# Patient Record
Sex: Female | Born: 1975 | Hispanic: Yes | Smoking: Never smoker
Health system: Southern US, Community
[De-identification: ages and names within clinical notes are randomized; demographics above are authoritative.]

## PROBLEM LIST (undated history)

## (undated) ENCOUNTER — Inpatient Hospital Stay (HOSPITAL_COMMUNITY): Payer: Self-pay

## (undated) DIAGNOSIS — Z5189 Encounter for other specified aftercare: Secondary | ICD-10-CM

## (undated) DIAGNOSIS — H269 Unspecified cataract: Secondary | ICD-10-CM

## (undated) DIAGNOSIS — N920 Excessive and frequent menstruation with regular cycle: Secondary | ICD-10-CM

## (undated) DIAGNOSIS — F329 Major depressive disorder, single episode, unspecified: Secondary | ICD-10-CM

## (undated) DIAGNOSIS — M1990 Unspecified osteoarthritis, unspecified site: Secondary | ICD-10-CM

## (undated) DIAGNOSIS — K219 Gastro-esophageal reflux disease without esophagitis: Secondary | ICD-10-CM

## (undated) DIAGNOSIS — G5603 Carpal tunnel syndrome, bilateral upper limbs: Secondary | ICD-10-CM

## (undated) DIAGNOSIS — F419 Anxiety disorder, unspecified: Secondary | ICD-10-CM

## (undated) DIAGNOSIS — O24419 Gestational diabetes mellitus in pregnancy, unspecified control: Secondary | ICD-10-CM

## (undated) DIAGNOSIS — I10 Essential (primary) hypertension: Secondary | ICD-10-CM

## (undated) DIAGNOSIS — E119 Type 2 diabetes mellitus without complications: Secondary | ICD-10-CM

## (undated) DIAGNOSIS — Z8632 Personal history of gestational diabetes: Secondary | ICD-10-CM

## (undated) DIAGNOSIS — A048 Other specified bacterial intestinal infections: Secondary | ICD-10-CM

## (undated) DIAGNOSIS — Z973 Presence of spectacles and contact lenses: Secondary | ICD-10-CM

## (undated) DIAGNOSIS — E039 Hypothyroidism, unspecified: Secondary | ICD-10-CM

## (undated) DIAGNOSIS — E282 Polycystic ovarian syndrome: Secondary | ICD-10-CM

## (undated) DIAGNOSIS — G4730 Sleep apnea, unspecified: Secondary | ICD-10-CM

## (undated) DIAGNOSIS — E133299 Other specified diabetes mellitus with mild nonproliferative diabetic retinopathy without macular edema, unspecified eye: Secondary | ICD-10-CM

## (undated) DIAGNOSIS — D649 Anemia, unspecified: Secondary | ICD-10-CM

## (undated) DIAGNOSIS — N926 Irregular menstruation, unspecified: Secondary | ICD-10-CM

## (undated) DIAGNOSIS — D500 Iron deficiency anemia secondary to blood loss (chronic): Secondary | ICD-10-CM

## (undated) DIAGNOSIS — F32A Depression, unspecified: Secondary | ICD-10-CM

## (undated) DIAGNOSIS — N3941 Urge incontinence: Secondary | ICD-10-CM

## (undated) HISTORY — PX: OTHER SURGICAL HISTORY: SHX169

## (undated) HISTORY — DX: Depression, unspecified: F32.A

## (undated) HISTORY — DX: Encounter for other specified aftercare: Z51.89

## (undated) HISTORY — DX: Anxiety disorder, unspecified: F41.9

## (undated) HISTORY — DX: Sleep apnea, unspecified: G47.30

## (undated) HISTORY — DX: Unspecified cataract: H26.9

## (undated) HISTORY — DX: Major depressive disorder, single episode, unspecified: F32.9

---

## 2005-02-04 ENCOUNTER — Emergency Department (HOSPITAL_COMMUNITY): Admission: EM | Admit: 2005-02-04 | Discharge: 2005-02-04 | Payer: Self-pay | Admitting: Emergency Medicine

## 2005-02-12 ENCOUNTER — Emergency Department (HOSPITAL_COMMUNITY): Admission: EM | Admit: 2005-02-12 | Discharge: 2005-02-12 | Payer: Self-pay | Admitting: Emergency Medicine

## 2005-06-14 ENCOUNTER — Emergency Department (HOSPITAL_COMMUNITY): Admission: EM | Admit: 2005-06-14 | Discharge: 2005-06-14 | Payer: Self-pay | Admitting: Emergency Medicine

## 2006-05-13 ENCOUNTER — Emergency Department (HOSPITAL_COMMUNITY): Admission: EM | Admit: 2006-05-13 | Discharge: 2006-05-13 | Payer: Self-pay | Admitting: Emergency Medicine

## 2007-09-12 ENCOUNTER — Emergency Department (HOSPITAL_COMMUNITY): Admission: EM | Admit: 2007-09-12 | Discharge: 2007-09-12 | Payer: Self-pay | Admitting: Emergency Medicine

## 2009-03-14 ENCOUNTER — Inpatient Hospital Stay (HOSPITAL_COMMUNITY): Admission: AD | Admit: 2009-03-14 | Discharge: 2009-03-14 | Payer: Self-pay | Admitting: Obstetrics & Gynecology

## 2009-03-15 ENCOUNTER — Inpatient Hospital Stay (HOSPITAL_COMMUNITY): Admission: RE | Admit: 2009-03-15 | Discharge: 2009-03-15 | Payer: Self-pay | Admitting: Obstetrics & Gynecology

## 2009-04-21 ENCOUNTER — Ambulatory Visit: Payer: Self-pay | Admitting: Obstetrics and Gynecology

## 2009-04-21 ENCOUNTER — Encounter: Payer: Self-pay | Admitting: Obstetrics & Gynecology

## 2009-04-21 ENCOUNTER — Other Ambulatory Visit: Admission: RE | Admit: 2009-04-21 | Discharge: 2009-04-21 | Payer: Self-pay | Admitting: Obstetrics and Gynecology

## 2009-04-21 LAB — CONVERTED CEMR LAB
HCT: 42.2 % (ref 36.0–46.0)
Hemoglobin: 14.3 g/dL (ref 12.0–15.0)
MCHC: 33.9 g/dL (ref 30.0–36.0)
MCV: 84.9 fL (ref 78.0–100.0)
RBC: 4.97 M/uL (ref 3.87–5.11)
RDW: 12.7 % (ref 11.5–15.5)
TSH: 1.243 microintl units/mL (ref 0.350–4.500)

## 2009-04-22 ENCOUNTER — Encounter: Payer: Self-pay | Admitting: Obstetrics & Gynecology

## 2009-05-12 ENCOUNTER — Ambulatory Visit: Payer: Self-pay | Admitting: Obstetrics and Gynecology

## 2009-05-13 ENCOUNTER — Encounter: Payer: Self-pay | Admitting: Obstetrics and Gynecology

## 2009-05-13 LAB — CONVERTED CEMR LAB
AST: 37 units/L (ref 0–37)
Alkaline Phosphatase: 124 units/L — ABNORMAL HIGH (ref 39–117)
BUN: 13 mg/dL (ref 6–23)
CO2: 24 meq/L (ref 19–32)
Calcium: 9.3 mg/dL (ref 8.4–10.5)
Chloride: 100 meq/L (ref 96–112)
Creatinine, Ser: 0.78 mg/dL (ref 0.40–1.20)
Glucose, Bld: 285 mg/dL — ABNORMAL HIGH (ref 70–99)

## 2009-06-16 ENCOUNTER — Ambulatory Visit: Payer: Self-pay | Admitting: Obstetrics and Gynecology

## 2009-06-20 ENCOUNTER — Ambulatory Visit: Payer: Self-pay | Admitting: Family Medicine

## 2009-06-20 DIAGNOSIS — E119 Type 2 diabetes mellitus without complications: Secondary | ICD-10-CM | POA: Insufficient documentation

## 2009-06-20 DIAGNOSIS — E282 Polycystic ovarian syndrome: Secondary | ICD-10-CM | POA: Insufficient documentation

## 2009-06-20 DIAGNOSIS — G5600 Carpal tunnel syndrome, unspecified upper limb: Secondary | ICD-10-CM | POA: Insufficient documentation

## 2009-08-29 ENCOUNTER — Inpatient Hospital Stay (HOSPITAL_COMMUNITY): Admission: AD | Admit: 2009-08-29 | Discharge: 2009-08-29 | Payer: Self-pay | Admitting: Obstetrics and Gynecology

## 2010-12-08 ENCOUNTER — Encounter: Payer: Self-pay | Admitting: *Deleted

## 2011-01-01 ENCOUNTER — Emergency Department (HOSPITAL_COMMUNITY)
Admission: EM | Admit: 2011-01-01 | Discharge: 2011-01-02 | Disposition: A | Discharge status: Home / Self Care | Payer: Self-pay | Attending: Emergency Medicine | Admitting: Emergency Medicine

## 2011-01-01 DIAGNOSIS — H5789 Other specified disorders of eye and adnexa: Secondary | ICD-10-CM | POA: Insufficient documentation

## 2011-01-01 DIAGNOSIS — J029 Acute pharyngitis, unspecified: Secondary | ICD-10-CM | POA: Insufficient documentation

## 2011-01-01 DIAGNOSIS — J329 Chronic sinusitis, unspecified: Secondary | ICD-10-CM | POA: Insufficient documentation

## 2011-01-01 DIAGNOSIS — H11419 Vascular abnormalities of conjunctiva, unspecified eye: Secondary | ICD-10-CM | POA: Insufficient documentation

## 2011-01-01 DIAGNOSIS — R51 Headache: Secondary | ICD-10-CM | POA: Insufficient documentation

## 2011-01-01 DIAGNOSIS — J3489 Other specified disorders of nose and nasal sinuses: Secondary | ICD-10-CM | POA: Insufficient documentation

## 2011-01-01 DIAGNOSIS — H109 Unspecified conjunctivitis: Secondary | ICD-10-CM | POA: Insufficient documentation

## 2011-01-31 LAB — CBC
HCT: 41.9 % (ref 36.0–46.0)
Hemoglobin: 14.2 g/dL (ref 12.0–15.0)
MCV: 86.1 fL (ref 78.0–100.0)
Platelets: 376 10*3/uL (ref 150–400)
RBC: 4.87 MIL/uL (ref 3.87–5.11)
RDW: 12.5 % (ref 11.5–15.5)
WBC: 12.3 10*3/uL — ABNORMAL HIGH (ref 4.0–10.5)

## 2011-01-31 LAB — HCG, SERUM, QUALITATIVE: Preg, Serum: NEGATIVE

## 2011-02-05 LAB — POCT URINALYSIS DIP (DEVICE)
Bilirubin Urine: NEGATIVE
Glucose, UA: 500 mg/dL — AB
Hgb urine dipstick: NEGATIVE
Ketones, ur: NEGATIVE mg/dL
Nitrite: NEGATIVE
Protein, ur: NEGATIVE mg/dL
Specific Gravity, Urine: 1.01 (ref 1.005–1.030)
Urobilinogen, UA: 1 mg/dL (ref 0.0–1.0)
pH: 5.5 (ref 5.0–8.0)

## 2011-02-05 LAB — POCT PREGNANCY, URINE: Preg Test, Ur: NEGATIVE

## 2011-02-06 LAB — URINALYSIS, ROUTINE W REFLEX MICROSCOPIC
Glucose, UA: NEGATIVE mg/dL
Ketones, ur: NEGATIVE mg/dL
Nitrite: NEGATIVE
Specific Gravity, Urine: >1.03 — ABNORMAL HIGH (ref 1.005–1.030)
pH: 5.5 (ref 5.0–8.0)

## 2011-02-06 LAB — CBC
HCT: 40.1 % (ref 36.0–46.0)
MCHC: 34.8 g/dL (ref 30.0–36.0)
MCV: 86.5 fL (ref 78.0–100.0)
Platelets: 302 10*3/uL (ref 150–400)

## 2011-02-06 LAB — WET PREP, GENITAL: Yeast Wet Prep HPF POC: NONE SEEN

## 2011-02-06 LAB — GC/CHLAMYDIA PROBE AMP, GENITAL
Chlamydia, DNA Probe: NEGATIVE
GC Probe Amp, Genital: NEGATIVE

## 2011-02-06 LAB — URINE MICROSCOPIC-ADD ON
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
## 2011-03-13 NOTE — Group Therapy Note (Signed)
NAME:  SHARNEE, DOUGLASS NO.:  [id number]   MEDICAL RECORD NO.:  [id number]          PATIENT TYPE:  WOC   LOCATION:  [facility]                   FACILITY:  [facility]   PHYSICIAN:  Argentina Donovan, MD        DATE OF BIRTH:  04-12-1976   DATE OF SERVICE:  06/16/2009                                  CLINIC NOTE   The patient is a 35 year old English speaking Hispanic female, gravida  2, para 2-0-0-2, who was in prior visits for menometrorrhagia.  She has  polycystic ovarian syndrome and was placed on metformin 500.  She also  has a diabetic and has not had any real followup.  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  She has made a  deal for consultation with the nutritionist there and she is already  started to lose some weight.  On her last visit, which was in July, she  weighed 280 pounds, and today she is 275.  She is going to be followed  the [facility].  She has an appointment there for control  her diabetes.  She has been on metformin 500.  We are going to raise  that to 1000.  I have wondered if she starts ovulating regularly, she is  got to be careful of getting pregnant, she states she lives with her  husband, but they do not have sex anymore, and I told her once she is  under control with her diabetes, and if her periods are still very very  irregular after 2 months with control of her diabetes and being on the  metformin 1000 b.i.d., to come up back in, so we can reevaluate her.  An  endometrial biopsy was done, was negative.   IMPRESSION:  Polycystic ovarian syndrome, metabolic syndrome with  diabetes mellitus.           ______________________________  Argentina Donovan, MD     PR/MEDQ  D:  06/16/2009  T:  06/17/2009  Job:  [id number]

## 2011-07-19 ENCOUNTER — Emergency Department (HOSPITAL_COMMUNITY)
Admission: EM | Admit: 2011-07-19 | Discharge: 2011-07-19 | Disposition: A | Discharge status: Home / Self Care | Payer: Self-pay | Attending: Emergency Medicine | Admitting: Emergency Medicine

## 2011-07-19 DIAGNOSIS — R21 Rash and other nonspecific skin eruption: Secondary | ICD-10-CM | POA: Insufficient documentation

## 2011-07-19 DIAGNOSIS — L293 Anogenital pruritus, unspecified: Secondary | ICD-10-CM | POA: Insufficient documentation

## 2011-07-19 DIAGNOSIS — R300 Dysuria: Secondary | ICD-10-CM | POA: Insufficient documentation

## 2011-07-19 DIAGNOSIS — N898 Other specified noninflammatory disorders of vagina: Secondary | ICD-10-CM | POA: Insufficient documentation

## 2011-07-19 DIAGNOSIS — E119 Type 2 diabetes mellitus without complications: Secondary | ICD-10-CM | POA: Insufficient documentation

## 2011-07-19 LAB — URINE MICROSCOPIC-ADD ON

## 2011-07-19 LAB — URINALYSIS, ROUTINE W REFLEX MICROSCOPIC
Leukocytes, UA: NEGATIVE
Nitrite: NEGATIVE
Urobilinogen, UA: 0.2 mg/dL (ref 0.0–1.0)

## 2011-07-19 LAB — WET PREP, GENITAL
Trich, Wet Prep: NONE SEEN
Yeast Wet Prep HPF POC: NONE SEEN

## 2011-09-20 ENCOUNTER — Encounter: Payer: Self-pay | Admitting: Emergency Medicine

## 2011-09-20 ENCOUNTER — Emergency Department (HOSPITAL_COMMUNITY)
Admission: EM | Admit: 2011-09-20 | Discharge: 2011-09-20 | Disposition: A | Discharge status: Home / Self Care | Payer: Self-pay | Attending: Emergency Medicine | Admitting: Emergency Medicine

## 2011-09-20 DIAGNOSIS — L293 Anogenital pruritus, unspecified: Secondary | ICD-10-CM | POA: Insufficient documentation

## 2011-09-20 DIAGNOSIS — N899 Noninflammatory disorder of vagina, unspecified: Secondary | ICD-10-CM | POA: Insufficient documentation

## 2011-09-20 DIAGNOSIS — N949 Unspecified condition associated with female genital organs and menstrual cycle: Secondary | ICD-10-CM | POA: Insufficient documentation

## 2011-09-20 DIAGNOSIS — B3731 Acute candidiasis of vulva and vagina: Secondary | ICD-10-CM | POA: Insufficient documentation

## 2011-09-20 DIAGNOSIS — E119 Type 2 diabetes mellitus without complications: Secondary | ICD-10-CM | POA: Insufficient documentation

## 2011-09-20 DIAGNOSIS — E669 Obesity, unspecified: Secondary | ICD-10-CM | POA: Insufficient documentation

## 2011-09-20 DIAGNOSIS — B373 Candidiasis of vulva and vagina: Secondary | ICD-10-CM

## 2011-09-20 DIAGNOSIS — Z79899 Other long term (current) drug therapy: Secondary | ICD-10-CM | POA: Insufficient documentation

## 2011-09-20 DIAGNOSIS — I10 Essential (primary) hypertension: Secondary | ICD-10-CM | POA: Insufficient documentation

## 2011-09-20 DIAGNOSIS — R21 Rash and other nonspecific skin eruption: Secondary | ICD-10-CM | POA: Insufficient documentation

## 2011-09-20 HISTORY — DX: Essential (primary) hypertension: I10

## 2011-09-20 LAB — URINALYSIS, ROUTINE W REFLEX MICROSCOPIC
Bilirubin Urine: NEGATIVE
Ketones, ur: NEGATIVE mg/dL
Nitrite: NEGATIVE
Protein, ur: NEGATIVE mg/dL
Urobilinogen, UA: 0.2 mg/dL (ref 0.0–1.0)

## 2011-09-20 LAB — WET PREP, GENITAL
Clue Cells Wet Prep HPF POC: NONE SEEN
Trich, Wet Prep: NONE SEEN
Yeast Wet Prep HPF POC: NONE SEEN

## 2011-09-20 MED ORDER — FLUCONAZOLE 150 MG PO TABS
150.0000 mg | ORAL_TABLET | ORAL | Status: AC
  Administered 2011-09-20: 150 mg via ORAL
  Filled 2011-09-20: qty 1

## 2011-09-20 MED ORDER — CLOTRIMAZOLE 1 % EX CREA: TOPICAL_CREAM | CUTANEOUS | Status: DC

## 2011-09-20 MED ORDER — AZITHROMYCIN 250 MG PO TABS
1000.0000 mg | ORAL_TABLET | Freq: Once | ORAL | Status: AC
  Administered 2011-09-20: 1000 mg via ORAL
  Filled 2011-09-20: qty 4

## 2011-09-20 MED ORDER — DIPHENHYDRAMINE HCL 25 MG PO CAPS: 25.0000 mg | ORAL_CAPSULE | Freq: Four times a day (QID) | ORAL | Status: DC | PRN

## 2011-09-20 MED ORDER — CEFTRIAXONE SODIUM 250 MG IJ SOLR
250.0000 mg | Freq: Once | INTRAMUSCULAR | Status: AC
  Administered 2011-09-20: 250 mg via INTRAMUSCULAR
  Filled 2011-09-20: qty 250

## 2011-09-20 MED ORDER — FLUCONAZOLE 200 MG PO TABS: 200.0000 mg | ORAL_TABLET | Freq: Every day | ORAL | Status: DC

## 2011-09-20 MED ORDER — LIDOCAINE HCL (PF) 1 % IJ SOLN
INTRAMUSCULAR | Status: AC
  Administered 2011-09-20: 0.9 mL
  Filled 2011-09-20: qty 5

## 2011-09-20 MED ORDER — NYSTATIN-TRIAMCINOLONE 100000-0.1 UNIT/GM-% EX CREA: TOPICAL_CREAM | CUTANEOUS | Status: DC

## 2011-09-20 NOTE — ED Notes (Signed)
Patient is resting comfortably. 

## 2011-09-20 NOTE — ED Notes (Signed)
Pt states pain has been going on for about two months.  Pt states she has been to the doctor and has been treated for "infection" but not getting any better.

## 2011-09-20 NOTE — ED Notes (Signed)
Pharmacy called as still waiting for medication

## 2011-09-20 NOTE — ED Provider Notes (Signed)
History     CSN: [id number] Arrival date & time: 09/20/2011  8:50 AM   First MD Initiated Contact with Patient 09/20/11 [phone]      Chief Complaint  Patient presents with  . Pelvic Pain    (Consider location/radiation/quality/duration/timing/severity/associated sxs/prior treatment) Patient is a 35 y.o. female presenting with vaginal itching. The history is provided by the patient.  Vaginal Itching Associated symptoms include a rash. Pertinent negatives include no abdominal pain, nausea or vomiting.   Patient presents today complaining of vaginal itching and burning. This is a recurrent problem. She states she was originally seen for this in the ED about 2 months ago and was reevaluated by her GYN. She was apparently put on Diflucan which she took 3 times every week. This seemed to help somewhat, but symptoms have been recurring over the past week or 2. She states that she has a lot of vaginal irritation, and is very painful to urinate due to the irritation of the exterior skin. She's not noticed any unusual bleeding or discharge. She's not had any fevers at home. She does have diabetes, and was recently diagnosed. She states that her blood sugars have been running in the high 100s over the past several weeks. In addition to the Diflucan, she's been using nystatin cream as well as some over-the-counter cream for her vaginal irritation and another over-the-counter antibacterial cream. She states that the pain has been especially bad over the past several days. She tried to call her doctor's office which was closed due to the Thanksgiving holiday, so she came here.  Past Medical History  Diagnosis Date  . Hypertension   . Diabetes mellitus     No past surgical history on file.  No family history on file.  History  Substance Use Topics  . Smoking status: Not on file  . Smokeless tobacco: Not on file  . Alcohol Use: Yes     socially    OB History    Grav Para Term Preterm Abortions  TAB SAB Ect Mult Living                  Review of Systems  Constitutional: Negative.   Gastrointestinal: Negative for nausea, vomiting and abdominal pain.  Genitourinary: Positive for dysuria and vaginal pain. Negative for frequency, hematuria, flank pain, difficulty urinating and pelvic pain.  Skin: Positive for color change and rash.    Allergies  Review of patient's allergies indicates no known allergies.  Home Medications   Current Outpatient Rx  Name Route Sig Dispense Refill  . METFORMIN HCL 1000 MG PO TABS  NO INSTRUCTIONS GIVEN       BP 149/92  Pulse 90  Temp(Src) 97.6 F (36.4 C) (Oral)  Resp 18  SpO2 98%  Physical Exam  Nursing note and vitals reviewed. Constitutional: She is oriented to person, place, and time. She appears well-developed and well-nourished. No distress.  HENT:  Head: Normocephalic and atraumatic.  Right Ear: External ear normal.  Left Ear: External ear normal.  Mouth/Throat: Oropharynx is clear and moist. No oropharyngeal exudate.  Eyes: Conjunctivae are normal. Pupils are equal, round, and reactive to light.  Neck: Normal range of motion.  Abdominal: Soft. Bowel sounds are normal.       Obese abd  Genitourinary:       Chaperone present during exam. External vulvar irritation noted; appears c/w vulvovaginitis. Satellite lesions also present. Cervix does not appear irritated, and there is not a copious amount of vaginal discharge.  Musculoskeletal: Normal range of motion.  Neurological: She is alert and oriented to person, place, and time.  Skin: Skin is warm and dry. No rash noted. She is not diaphoretic.    ED Course  Procedures (including critical care time)  Labs Reviewed  WET PREP, GENITAL - Abnormal; Notable for the following:    WBC, Wet Prep HPF POC MANY (*)    All other components within normal limits  URINALYSIS, ROUTINE W REFLEX MICROSCOPIC - Abnormal; Notable for the following:    Appearance CLOUDY (*)    Glucose, UA  >1000 (*)    Hgb urine dipstick SMALL (*)    Leukocytes, UA SMALL (*)    All other components within normal limits  URINE MICROSCOPIC-ADD ON - Abnormal; Notable for the following:    Squamous Epithelial / LPF FEW (*)    All other components within normal limits  GC/CHLAMYDIA PROBE AMP, GENITAL  URINE CULTURE   No results found.   1. Candidal vulvovaginitis       MDM  Many WBCs seen on wet prep. Pt treated pre-emptively for GC/chlam. Clinically, this appears c/w mod-severe candida vulvovaginitis. She was given 1 Diflucan in the dept and given rxes for Lotrimin, nystatin/triamcinolone, PO Benadryl for itching, and additional Diflucan to take in 72hrs. She was instructed to f/u with GYN for a recheck and further mgmt.        Grant Fontana, [place] 09/20/11 1950

## 2011-09-20 NOTE — ED Notes (Signed)
Pelvic cart at bedside, patient undressed from waist down.

## 2011-09-21 LAB — GC/CHLAMYDIA PROBE AMP, GENITAL
Chlamydia, DNA Probe: NEGATIVE
GC Probe Amp, Genital: NEGATIVE

## 2011-09-21 NOTE — ED Provider Notes (Signed)
Medical screening examination/treatment/procedure(s) were performed by non-physician practitioner and as supervising physician I was immediately available for consultation/collaboration.  Flint Melter, MD 09/21/11 2350

## 2012-02-14 ENCOUNTER — Emergency Department (HOSPITAL_COMMUNITY)
Admission: EM | Admit: 2012-02-14 | Discharge: 2012-02-14 | Disposition: A | Discharge status: Home / Self Care | Payer: Self-pay | Attending: Emergency Medicine | Admitting: Emergency Medicine

## 2012-02-14 ENCOUNTER — Encounter (HOSPITAL_COMMUNITY): Payer: Self-pay

## 2012-02-14 ENCOUNTER — Emergency Department (HOSPITAL_COMMUNITY): Payer: Self-pay

## 2012-02-14 DIAGNOSIS — R51 Headache: Secondary | ICD-10-CM

## 2012-02-14 DIAGNOSIS — H538 Other visual disturbances: Secondary | ICD-10-CM | POA: Insufficient documentation

## 2012-02-14 MED ORDER — KETOROLAC TROMETHAMINE 15 MG/ML IJ SOLN
15.0000 mg | Freq: Once | INTRAMUSCULAR | Status: AC
  Administered 2012-02-14: 15 mg via INTRAVENOUS
  Filled 2012-02-14: qty 1

## 2012-02-14 MED ORDER — METOCLOPRAMIDE HCL 5 MG/ML IJ SOLN
10.0000 mg | Freq: Once | INTRAMUSCULAR | Status: AC
  Administered 2012-02-14: 10 mg via INTRAVENOUS
  Filled 2012-02-14: qty 2

## 2012-02-14 MED ORDER — SODIUM CHLORIDE 0.9 % IV BOLUS (SEPSIS)
1000.0000 mL | Freq: Once | INTRAVENOUS | Status: AC
  Administered 2012-02-14: 1000 mL via INTRAVENOUS

## 2012-02-14 MED ORDER — DIPHENHYDRAMINE HCL 50 MG/ML IJ SOLN
25.0000 mg | Freq: Once | INTRAMUSCULAR | Status: AC
  Administered 2012-02-14: 25 mg via INTRAVENOUS
  Filled 2012-02-14: qty 1

## 2012-02-14 MED ORDER — KETOROLAC TROMETHAMINE 30 MG/ML IJ SOLN
INTRAMUSCULAR | Status: AC
  Filled 2012-02-14: qty 1

## 2012-02-14 MED ORDER — MORPHINE SULFATE 4 MG/ML IJ SOLN
6.0000 mg | Freq: Once | INTRAMUSCULAR | Status: AC
  Administered 2012-02-14: 6 mg via INTRAVENOUS
  Filled 2012-02-14: qty 2

## 2012-02-14 NOTE — ED Notes (Signed)
Patient is resting comfortably. 

## 2012-02-14 NOTE — ED Notes (Signed)
NAD noted at time of d/c home. D/C inst given by MD and pt and family verbalized an understanding.

## 2012-02-14 NOTE — ED Provider Notes (Signed)
History    36 year old female with headache. This was gradual onset. Triage note was reviewed but do not agree that headache onset was sudden. Onset was while patient was at work. There is no history of trauma. Since onset headache is been relatively persistent. Left temporal region. No radiation. Throbbing in nature. No significant past headache history. Initially felt like he had some blurred vision which has since resolved. No neck pain or neck stiffness. No numbness, tingling or loss of strength. Patient denies use of blood thinning medication. She does not think she has a family history of aneurysm. Hx through family translating at times.  CSN: [id number]  Arrival date & time 02/14/12  1004   First MD Initiated Contact with Patient 02/14/12 1012      Chief Complaint  Patient presents with  . Headache    (Consider location/radiation/quality/duration/timing/severity/associated sxs/prior treatment) HPI  Past Medical History  Diagnosis Date  . Hypertension   . Diabetes mellitus     Past Surgical History  Procedure Date  . Cesarean section     No family history on file.  History  Substance Use Topics  . Smoking status: Not on file  . Smokeless tobacco: Not on file  . Alcohol Use: No     socially    OB History    Grav Para Term Preterm Abortions TAB SAB Ect Mult Living                  Review of Systems   Review of symptoms negative unless otherwise noted in HPI.   Allergies  Review of patient's allergies indicates no known allergies.  Home Medications  No current outpatient prescriptions on file.  BP 114/56  Pulse 77  Temp(Src) 98.4 F (36.9 C) (Oral)  SpO2 96%  LMP 01/31/2012  Physical Exam  Nursing note and vitals reviewed. Constitutional: She is oriented to person, place, and time.       Sitting in bed holding L side of head. Eyes closed. Obese.  HENT:  Head: Normocephalic and atraumatic.  Eyes: Conjunctivae and EOM are normal. Pupils are equal,  round, and reactive to light. Right eye exhibits no discharge. Left eye exhibits no discharge.       Corneas clear. Pupils 4mm and reactive b/l. Anterior chambers clear. No proptosis. Lids, lashes and periorbital tissues unremarkable.  Neck: Normal range of motion. Neck supple.  Cardiovascular: Normal rate, regular rhythm and normal heart sounds.  Exam reveals no gallop and no friction rub.   No murmur heard. Pulmonary/Chest: Effort normal and breath sounds normal. No respiratory distress.  Abdominal: Soft. She exhibits no distension. There is no tenderness.  Musculoskeletal: Normal range of motion. She exhibits no edema and no tenderness.  Lymphadenopathy:    She has no cervical adenopathy.  Neurological: She is alert and oriented to person, place, and time. No cranial nerve deficit. She exhibits normal muscle tone. Coordination normal.       Patient is alert. Cranial nerves are intact. Speech is clear and content appropriate. Strength is 5 out of 5 bilateral upper lower extremities. There is no pronator drift. Good finger to nose and heel to shin testing bilaterally. Is steady. Sensation is grossly intact to light touch. Visual fields intact to confrontation.  Skin: Skin is warm and dry. No rash noted.  Psychiatric: She has a normal mood and affect. Her behavior is normal. Thought content normal.    ED Course  Procedures (including critical care time)  Labs Reviewed - No data  to display Ct Head Wo Contrast  02/14/2012  *RADIOLOGY REPORT*  Clinical Data: Headache and blurred vision  CT HEAD WITHOUT CONTRAST  Technique:  Contiguous axial images were obtained from the base of the skull through the vertex without contrast.  Comparison: None.  Findings: The ventricles are normal in size, shape, and position. There is no mass effect or midline shift.  No acute hemorrhage or abnormal extra-axial fluid collections are identified.  The gray/white differentiation is normal.  The orbits, calvarium,  visualized paranasal sinuses have a normal appearance.  IMPRESSION: Normal CT scan of the head with no evidence of acute intracranial abnormality.  Original Report Authenticated By: Brandon Melnick, M.D.     1. Headache       MDM  36 year old female with headache. Concerning feature of worse headache of worst headache of life and no significant headache history. Headache was gradual onset though. No hx of trauma. Afebrile and no signs of meningismus.  CT was done to evaluate for possible subarachnoid hemorrhage. CT was done within 6 hours of symptom onset. Pt has a nonfocal neurological examination. Her initial visual complaints have completely resolved. Doubt ocular etiology. Eye exam normal. Consider glaucoma but doubt. Very unlikely temporal arteritis given age. Not like CO poisoning given warming weather and no contacts with similar. Doubt carotid/vertebral artery dissection. Doubt venous thrombosis. Headache is almost completely resolved after pain medicines. Patient feels comfortable going home. CT of the head was negative for acute pathology. Return precautions were discussed with patient and family. Outpatient followup as needed.        Raeford Razor, MD 02/14/12 1213

## 2012-02-14 NOTE — ED Notes (Signed)
Family at bedside. 

## 2012-02-14 NOTE — ED Notes (Signed)
Pt. States she was working when she had a sudden onset of HA with blurred vision. Currently vision normal for patient.

## 2012-02-14 NOTE — Discharge Instructions (Signed)
Headaches, Frequently Asked Questions MIGRAINE HEADACHES Q: What is migraine? What causes it? How can I treat it? A: Generally, migraine headaches begin as a dull ache. Then they develop into a constant, throbbing, and pulsating pain. You may experience pain at the temples. You may experience pain at the front or back of one or both sides of the head. The pain is usually accompanied by a combination of:  Nausea.   Vomiting.   Sensitivity to light and noise.  Some people (about 15%) experience an aura (see below) before an attack. The cause of migraine is believed to be chemical reactions in the brain. Treatment for migraine may include over-the-counter or prescription medications. It may also include self-help techniques. These include relaxation training and biofeedback.  Q: What is an aura? A: About 15% of people with migraine get an "aura". This is a sign of neurological symptoms that occur before a migraine headache. You may see wavy or jagged lines, dots, or flashing lights. You might experience tunnel vision or blind spots in one or both eyes. The aura can include visual or auditory hallucinations (something imagined). It may include disruptions in smell (such as strange odors), taste or touch. Other symptoms include:  Numbness.   A "pins and needles" sensation.   Difficulty in recalling or speaking the correct word.  These neurological events may last as long as 60 minutes. These symptoms will fade as the headache begins. Q: What is a trigger? A: Certain physical or environmental factors can lead to or "trigger" a migraine. These include:  Foods.   Hormonal changes.   Weather.   Stress.  It is important to remember that triggers are different for everyone. To help prevent migraine attacks, you need to figure out which triggers affect you. Keep a headache diary. This is a good way to track triggers. The diary will help you talk to your healthcare professional about your  condition. Q: Does weather affect migraines? A: Bright sunshine, hot, humid conditions, and drastic changes in barometric pressure may lead to, or "trigger," a migraine attack in some people. But studies have shown that weather does not act as a trigger for everyone with migraines. Q: What is the link between migraine and hormones? A: Hormones start and regulate many of your body's functions. Hormones keep your body in balance within a constantly changing environment. The levels of hormones in your body are unbalanced at times. Examples are during menstruation, pregnancy, or menopause. That can lead to a migraine attack. In fact, about three quarters of all women with migraine report that their attacks are related to the menstrual cycle.  Q: Is there an increased risk of stroke for migraine sufferers? A: The likelihood of a migraine attack causing a stroke is very remote. That is not to say that migraine sufferers cannot have a stroke associated with their migraines. In persons under age 40, the most common associated factor for stroke is migraine headache. But over the course of a person's normal life span, the occurrence of migraine headache may actually be associated with a reduced risk of dying from cerebrovascular disease due to stroke.  Q: What are acute medications for migraine? A: Acute medications are used to treat the pain of the headache after it has started. Examples over-the-counter medications, NSAIDs, ergots, and triptans.  Q: What are the triptans? A: Triptans are the newest class of abortive medications. They are specifically targeted to treat migraine. Triptans are vasoconstrictors. They moderate some chemical reactions in the brain.   The triptans work on receptors in your brain. Triptans help to restore the balance of a neurotransmitter called serotonin. Fluctuations in levels of serotonin are thought to be a main cause of migraine.  Q: Are over-the-counter medications for migraine  effective? A: Over-the-counter, or "OTC," medications may be effective in relieving mild to moderate pain and associated symptoms of migraine. But you should see your caregiver before beginning any treatment regimen for migraine.  Q: What are preventive medications for migraine? A: Preventive medications for migraine are sometimes referred to as "prophylactic" treatments. They are used to reduce the frequency, severity, and length of migraine attacks. Examples of preventive medications include antiepileptic medications, antidepressants, beta-blockers, calcium channel blockers, and NSAIDs (nonsteroidal anti-inflammatory drugs). Q: Why are anticonvulsants used to treat migraine? A: During the past few years, there has been an increased interest in antiepileptic drugs for the prevention of migraine. They are sometimes referred to as "anticonvulsants". Both epilepsy and migraine may be caused by similar reactions in the brain.  Q: Why are antidepressants used to treat migraine? A: Antidepressants are typically used to treat people with depression. They may reduce migraine frequency by regulating chemical levels, such as serotonin, in the brain.  Q: What alternative therapies are used to treat migraine? A: The term "alternative therapies" is often used to describe treatments considered outside the scope of conventional Western medicine. Examples of alternative therapy include acupuncture, acupressure, and yoga. Another common alternative treatment is herbal therapy. Some herbs are believed to relieve headache pain. Always discuss alternative therapies with your caregiver before proceeding. Some herbal products contain arsenic and other toxins. TENSION HEADACHES Q: What is a tension-type headache? What causes it? How can I treat it? A: Tension-type headaches occur randomly. They are often the result of temporary stress, anxiety, fatigue, or anger. Symptoms include soreness in your temples, a tightening  band-like sensation around your head (a "vice-like" ache). Symptoms can also include a pulling feeling, pressure sensations, and contracting head and neck muscles. The headache begins in your forehead, temples, or the back of your head and neck. Treatment for tension-type headache may include over-the-counter or prescription medications. Treatment may also include self-help techniques such as relaxation training and biofeedback. CLUSTER HEADACHES Q: What is a cluster headache? What causes it? How can I treat it? A: Cluster headache gets its name because the attacks come in groups. The pain arrives with little, if any, warning. It is usually on one side of the head. A tearing or bloodshot eye and a runny nose on the same side of the headache may also accompany the pain. Cluster headaches are believed to be caused by chemical reactions in the brain. They have been described as the most severe and intense of any headache type. Treatment for cluster headache includes prescription medication and oxygen. SINUS HEADACHES Q: What is a sinus headache? What causes it? How can I treat it? A: When a cavity in the bones of the face and skull (a sinus) becomes inflamed, the inflammation will cause localized pain. This condition is usually the result of an allergic reaction, a tumor, or an infection. If your headache is caused by a sinus blockage, such as an infection, you will probably have a fever. An x-ray will confirm a sinus blockage. Your caregiver's treatment might include antibiotics for the infection, as well as antihistamines or decongestants.  REBOUND HEADACHES Q: What is a rebound headache? What causes it? How can I treat it? A: A pattern of taking acute headache medications too   often can lead to a condition known as "rebound headache." A pattern of taking too much headache medication includes taking it more than 2 days per week or in excessive amounts. That means more than the label or a caregiver advises.  With rebound headaches, your medications not only stop relieving pain, they actually begin to cause headaches. Doctors treat rebound headache by tapering the medication that is being overused. Sometimes your caregiver will gradually substitute a different type of treatment or medication. Stopping may be a challenge. Regularly overusing a medication increases the potential for serious side effects. Consult a caregiver if you regularly use headache medications more than 2 days per week or more than the label advises. ADDITIONAL QUESTIONS AND ANSWERS Q: What is biofeedback? A: Biofeedback is a self-help treatment. Biofeedback uses special equipment to monitor your body's involuntary physical responses. Biofeedback monitors:  Breathing.   Pulse.   Heart rate.   Temperature.   Muscle tension.   Brain activity.  Biofeedback helps you refine and perfect your relaxation exercises. You learn to control the physical responses that are related to stress. Once the technique has been mastered, you do not need the equipment any more. Q: Are headaches hereditary? A: Four out of five (80%) of people that suffer report a family history of migraine. Scientists are not sure if this is genetic or a family predisposition. Despite the uncertainty, a child has a 50% chance of having migraine if one parent suffers. The child has a 75% chance if both parents suffer.  Q: Can children get headaches? A: By the time they reach high school, most young people have experienced some type of headache. Many safe and effective approaches or medications can prevent a headache from occurring or stop it after it has begun.  Q: What type of doctor should I see to diagnose and treat my headache? A: Start with your primary caregiver. Discuss his or her experience and approach to headaches. Discuss methods of classification, diagnosis, and treatment. Your caregiver may decide to recommend you to a headache specialist, depending upon  your symptoms or other physical conditions. Having diabetes, allergies, etc., may require a more comprehensive and inclusive approach to your headache. The National Headache Foundation will provide, upon request, a list of [facility] physician members in your state. Document Released: 01/05/2004 Document Revised: 10/04/2011 Document Reviewed: 06/14/2008 [facility] Patient Information 2012 [place], [place].  RESOURCE GUIDE  Dental Problems  Patients with Medicaid: [facility] [phone]                                           [phone] [address] [employer] Phone:  [phone]                                                  Phone:  [phone]  If unable to pay or uninsured, contact:  [facility] or [facility]. to become qualified for the adult dental clinic.  Chronic Pain Problems Contact [facility]  [phone] Patients need to be referred by their primary care doctor.  Insufficient Money for Medicine Contact  [address]:  call "211" or [employer] 301-111-8064.  No Primary Care Doctor Call [facility]  ([phone] Other agencies that provide inexpensive medical care    [facility]  [phone]    [facility]  [phone]    [facility]  [phone]    [facility]  [phone]    [facility]  ([phone]    [facility]  [phone]  Psychological Services [facility]  [phone] [facility] Services  [phone] [facility] Mental Health   [phone] (emergency services ([phone])  Substance Abuse Resources [facility]  4122751148 [facility] 669-346-8176 [facility] 2340245801 Floydene Flock ([phone] Residential & Outpatient Substance Abuse Program  (712)702-4653  Abuse/Neglect [facility] Child Abuse Hotline 438 207 8138 [facility] Child Abuse Hotline (930)307-5452 (After  Hours)  Emergency Shelter [facility] (757)572-3351  Maternity Homes [facility] [place] [facility] 3072480415 [facility] [phone]  MRSA Hotline #:   (571)587-1227    [facility] Resources  [facility] [place]     [employer]                          [facility] Dept. [address] [place]                       [address]      [address]  Blondell Reveal Phone:  [phone]                                   Phone:  [phone]                 Phone:  [phone]  [facility] Phone:  [phone]  [facility] Child Abuse Hotline [phone] (After Hours)

## 2013-11-09 ENCOUNTER — Encounter: Payer: Self-pay | Admitting: Obstetrics

## 2013-11-11 ENCOUNTER — Ambulatory Visit (INDEPENDENT_AMBULATORY_CARE_PROVIDER_SITE_OTHER): Payer: BC Managed Care – PPO | Admitting: Family Medicine

## 2013-11-11 ENCOUNTER — Ambulatory Visit: Payer: BC Managed Care – PPO

## 2013-11-11 VITALS — BP 158/96 | HR 103 | Temp 98.6°F | Resp 18 | Ht 62.0 in | Wt 262.0 lb

## 2013-11-11 DIAGNOSIS — E1165 Type 2 diabetes mellitus with hyperglycemia: Secondary | ICD-10-CM

## 2013-11-11 DIAGNOSIS — Z3009 Encounter for other general counseling and advice on contraception: Secondary | ICD-10-CM

## 2013-11-11 DIAGNOSIS — N926 Irregular menstruation, unspecified: Secondary | ICD-10-CM

## 2013-11-11 DIAGNOSIS — M79606 Pain in leg, unspecified: Secondary | ICD-10-CM

## 2013-11-11 DIAGNOSIS — M5430 Sciatica, unspecified side: Secondary | ICD-10-CM

## 2013-11-11 DIAGNOSIS — M545 Low back pain, unspecified: Secondary | ICD-10-CM

## 2013-11-11 DIAGNOSIS — M79609 Pain in unspecified limb: Secondary | ICD-10-CM

## 2013-11-11 DIAGNOSIS — IMO0001 Reserved for inherently not codable concepts without codable children: Secondary | ICD-10-CM

## 2013-11-11 DIAGNOSIS — IMO0002 Reserved for concepts with insufficient information to code with codable children: Secondary | ICD-10-CM

## 2013-11-11 LAB — CBC WITH DIFFERENTIAL/PLATELET
BASOS PCT: 0 % (ref 0–1)
Basophils Absolute: 0 10*3/uL (ref 0.0–0.1)
Eosinophils Absolute: 0.2 10*3/uL (ref 0.0–0.7)
Eosinophils Relative: 2 % (ref 0–5)
HEMATOCRIT: 44.8 % (ref 36.0–46.0)
HEMOGLOBIN: 15 g/dL (ref 12.0–15.0)
Lymphocytes Relative: 25 % (ref 12–46)
Lymphs Abs: 2.2 10*3/uL (ref 0.7–4.0)
MCH: 28.1 pg (ref 26.0–34.0)
MCHC: 33.5 g/dL (ref 30.0–36.0)
MCV: 84.1 fL (ref 78.0–100.0)
MONO ABS: 0.7 10*3/uL (ref 0.1–1.0)
MONOS PCT: 7 % (ref 3–12)
NEUTROS ABS: 6 10*3/uL (ref 1.7–7.7)
Neutrophils Relative %: 66 % (ref 43–77)
Platelets: 327 10*3/uL (ref 150–400)
RBC: 5.33 MIL/uL — ABNORMAL HIGH (ref 3.87–5.11)
RDW: 12.9 % (ref 11.5–15.5)
WBC: 9.1 10*3/uL (ref 4.0–10.5)

## 2013-11-11 LAB — POCT URINALYSIS DIPSTICK
Bilirubin, UA: NEGATIVE
Glucose, UA: >=1000
Leukocytes, UA: NEGATIVE
Nitrite, UA: NEGATIVE
PH UA: 5.5
SPEC GRAV UA: 1.02
Urobilinogen, UA: 1

## 2013-11-11 LAB — COMPREHENSIVE METABOLIC PANEL
ALBUMIN: 4 g/dL (ref 3.5–5.2)
ALK PHOS: 100 U/L (ref 39–117)
ALT: 29 U/L (ref 0–35)
AST: 25 U/L (ref 0–37)
BUN: 13 mg/dL (ref 6–23)
CALCIUM: 8.9 mg/dL (ref 8.4–10.5)
CHLORIDE: 99 meq/L (ref 96–112)
CO2: 26 meq/L (ref 19–32)
Creat: 0.79 mg/dL (ref 0.50–1.10)
GLUCOSE: 217 mg/dL — AB (ref 70–99)
POTASSIUM: 3.8 meq/L (ref 3.5–5.3)
Sodium: 136 mEq/L (ref 135–145)
Total Bilirubin: 0.3 mg/dL (ref 0.3–1.2)
Total Protein: 7.5 g/dL (ref 6.0–8.3)

## 2013-11-11 LAB — TSH: TSH: 0.507 u[IU]/mL (ref 0.350–4.500)

## 2013-11-11 LAB — POCT GLYCOSYLATED HEMOGLOBIN (HGB A1C): Hemoglobin A1C: 7.9

## 2013-11-11 LAB — POCT URINE PREGNANCY: PREG TEST UR: NEGATIVE

## 2013-11-11 LAB — GLUCOSE, POCT (MANUAL RESULT ENTRY): POC Glucose: 254 mg/dl — AB (ref 70–99)

## 2013-11-11 LAB — MICROALBUMIN, URINE: MICROALB UR: 19.25 mg/dL — AB (ref 0.00–1.89)

## 2013-11-11 MED ORDER — CYCLOBENZAPRINE HCL 5 MG PO TABS: 5.0000 mg | ORAL_TABLET | Freq: Every day | ORAL | Status: DC

## 2013-11-11 MED ORDER — NAPROXEN 500 MG PO TBEC: 500.0000 mg | DELAYED_RELEASE_TABLET | Freq: Two times a day (BID) | ORAL | Status: DC

## 2013-11-11 MED ORDER — METFORMIN HCL 500 MG PO TABS: 500.0000 mg | ORAL_TABLET | Freq: Two times a day (BID) | ORAL | Status: DC

## 2013-11-11 NOTE — Patient Instructions (Signed)
Ciática  con rehabilitación  (Sciatica with Rehab)  El nervio ciático va desde la región inferior de la espalda hacia la pierna y es el responsable de la sensibilidad y el control de los músculos de la parte de atrás (posterior) del muslo, pierna y pie. La ciática es una enfermedad caracterizada por una inflamación en este nervio.   SÍNTOMAS  · Signos de daño al nervio, incluso adormecimiento o debilidad en el lado posterior de las extremidades bajas.  · Dolor en la parte posterior del muslo que podría bajar hacia la pierna.  · Dolor que empeora al estar sentado durante largos períodos de tiempo.  · Algunas veces, sensibilidad en las nalgas.  CAUSAS  La causa de la ciática es la inflamación de los nervios ciáticos. La inflamación se debe a que algo irrita el nervio. Entre las causas de la irritación se encuentran:  · Estar sentado durante largos períodos.  · Traumatismos directos al nervio.  · Artritis de la columna.  · Hernia o ruptura de disco.  · Deslizamiento de una vértebra (espondilolistesis).  · Presión de los tejidos blandos, como músculos o el tejidos tipo ligamento (fascia).  LOS RIESGOS AUMENTAN CON:  · Deportes en los que se presiona la columna (fútbol americano o levantamiento de pesas).  · Poca fuerza y flexibilidad.  · No hacer un precalentamiento adecuado.  · Historia familiar de dolor de cintura o trastornos en discos.  · Lesiones o cirugías previas en la espalda.  · Mecánica incorrecta del cuerpo, en especial al levantar, o mala postura.  PREVENCIÓN  · Precalentamiento adecuado y elongación antes de la actividad.  · Mantener la forma física:  · Fuerza, flexibilidad y resistencia muscular.  · Capacidad cardiovascular.  · Conozca y use técnicas adecuadas, especialmente en posturas y levantamiento. Cuando sea posible, tener un entrenador que corrija la técnica incorrecta.  · Evite las actividades que tensionen constantemente la columna.  PRONÓSTICO  Si se trata adecuadamente, generalmente es curable  dentro de las 6 semanas. En ocasiones requiere someterse a una cirugía.   COMPLICACIONES RELACIONADAS  · Daño permanente que incluye dolor, adormecimiento, hormigueo o debilidad.  · Dolor crónico en la espalda.  · Riesgos de la cirugía: infecciones, hemorragias, daño en los nervios, o daños a los tejidos circundantes.  TRATAMIENTO  El tratamiento inicial incluye interrumpir las actividades que agravan los síntomas. Se incluye el uso de medicamentos y la aplicación de hielo para reducir el dolor y la inflamación. Los ejercicios de elongación y fortalecimiento pueden ayudar a reducir el dolor con la actividad. Los ejercicios pueden realizarse en el hogar o con un terapeuta. Un terapeuta podrá recomendarle otros tratamientos, como estimulación nerviosa electrónica transcutánea (TENS) o ultrasonido. En algunos casos se indica una inyección de corticoides para reducir la inflamación del nervio ciático. Si los síntomas persisten por más de 6 meses de tratamiento no quirúrgico (conservador), se indicará la cirugía.  MEDICAMENTOS   · Si necesita analgésicos, se recomiendan los antiinflamatorios no esteroides, como aspirina e ibuprofeno y otros calmantes menores, como acetaminofeno.  · No tome medicamentos para el dolor dentro de los 7 días previos a la cirugía.  · Los analgésicos prescriptos se indicarán si el médico lo considera necesario. Utilícelos como se le indique y sólo cuando lo necesite.  · Los ungüentos pueden ser beneficiosos.  · En algunos casos se indica una inyección de corticosteroides. Estas inyecciones deben reservarse para los casos graves, porque sólo se pueden administrar una determinada cantidad de veces.  CALOR Y   FRÍO   · El tratamiento con frío alivia el dolor y reduce la inflamación. El frío debe aplicarse durante 10 a 15 minutos cada 2 ó 3 horas para reducir la inflamación y el dolor e inmediatamente después de cualquier actividad que agrava los síntomas. Utilice bolsas de hielo o masajee la zona  con un trozo de hielo (masaje de hielo).  · El calor puede usarse antes de elongar y de las actividades de fortalecimiento indicadas por el profesional, le fisioterapeuta o el entrenador. Utilice una bolsa térmica o sumerja la lesión en agua caliente.  SOLICITE ATENCIÓN MÉDICA SI:  · El tratamiento parece no ofrecer beneficios, o el trastorno empeora.  · Los medicamentos producen efectos secundarios.  EJERCICIOS  EJERCICIOS DE AMPLITUD DE MOVIMIENTOS Y ELONGACIÓN Ciática  La mayoría de las personas con dolor de ciática encuentran que sus síntomas empeoran con el delantero excesiva flexión (flexión) o arco en la espalda baja (extensión). Los ejercicios que le ayudarán a resolver sus síntomas se centrarán en el movimiento contrario. El médico, fisioterapeuta o entrenador le ayudará a determinar qué ejercicios serán de ayuda para resolver su dolor de espalda. No realice ningún ejercicio sin consultarlo antes con el profesional. Discontinue los ejercicios que empeoran sus síntomas, hasta que hable con el médico. Si siente dolor, entumecimiento u hormigueo que irradia hacia los glúteos, piernas o pies, el objetivo de esta terapia es que estos síntomas se acerquen a la espalda y eventualmente desaparezcan. En ocasiones, los síntomas de la pierna mejorarán, pero el dolor en la espalda puede empeorar; esto es un indicio típico del progreso en la rehabilitación. Asegúrese de que estar atento a cualquier cambio en sus síntomas y las actividades que ha hecho en las 24 horas antes del cambio. Compartir esta información con su médico le permitirá un mejor tratamiento para tratar su enfermedad.  Estos ejercicios le ayudarán en la recuperación de la lesión. Los síntomas podrán aliviarse con o sin asistencia adicional de su médico, fisioterapeuta o entrenador. Al completar estos ejercicios, recuerde:   · Restaurar la flexibilidad del tejido ayuda a que las articulaciones recuperen el movimiento normal. Esto permite que el  movimiento y la actividad sea más saludables y menos dolorosos.  · Para que sea efectiva, cada elongación debe realizarse durante al menos 30 segundos.  · La elongación nunca debe ser dolorosa. Deberá sentir sólo un alargamiento o distensión suave del tejido que estira.  EJERCICIOS DE AMPLITUD DE MOVIMIENTOS Y ELONGACIÓN:  ELONGACION Flexión - una rodilla al pecho  · Recuéstese en una cama dura o sobre el piso, con ambas piernas extendidas al frente.  · Manteniendo una pierna en contacto con el piso, lleve la rodilla opuesta al pecho. Mantenga la pierna en esa posición, sosteniéndola por la zona posterior del muslo o por la rodilla.  · Presione hasta sentir un suave estiramiento en la cintura. Mantenga esta posición durante __________ segundos.  · Libere la pierna lentamente y repita el ejercicio con el lado opuesto.  Repítalo __________ veces. Realice este estiramiento __________ veces por día.   ELONGACIÓN Flexión, dos rodillas al pecho   · Recuéstese en una cama dura o sobre el piso, con ambas piernas extendidas al frente.  · Manteniendo una pierna en contacto con el piso, lleve la rodilla opuesta al pecho.  · Tense los músculos del estómago para apoyar la espalda y levante la otra rodilla hacia el pecho. Mantenga las piernas en su lugar y tómese por detrás de las caderas o las rodillas.  ·   Con ambas rodillas en el pecho, tire hasta que sienta un estiramiento en la parte trasera de la espalda. Mantenga esta posición durante __________ segundos.  · Tense los músculos del estómago y baje las piernas de a una por vez.  Repítalo __________ veces. Realice este estiramiento __________ veces por día.   ELONGACIÓN Rotación de la zona baja del tronco.  · Recuéstese sobre una cama firme o sobre el suelo. Mantenga las piernas al frente, doble las rodillas de modo que ambas apunten hacia el techo y los pies queden bien apoyados en el piso.  · Extienda los brazos a cada lado. Esto estabilizará la zona superior del cuerpo,  manteniendo los hombros en contacto con el piso.  · Con cuidado y lentamente deje caer ambas rodillas juntas hacia un lado, hasta que sienta un suave estiramiento en la espalda baja. Mantenga esta posición durante __________ segundos.  · Tense los músculos del estómago para apoyar la espalda y lleve las rodillas a la posición inicial. Repita el ejercicio hacia el otro lado.  Repítalo __________ veces. Realice este ejercicio __________ veces por día.  EJERCICIOS DE AMPLITUD DE MOVIMIENTOS Y FLEXIBILIDAD:  ELONGACIÓN Extensión posición prona sobre los codos  · Acuéstese sobre el estómago sobre el piso, una cama será muy blanda. Coloque las palmas a una distancia igual al ancho de los hombres y a la altura de la cabeza.  · Coloque los codos bajo los hombros. Si siente dolor, colóquese almohadas debajo del pecho.  · Deje que su cuerpo se relaje, de modo que las caderas queden más abajo y tengan más contacto con el piso.  · Mantenga esta posición durante __________ segundos.  · Vuelva lentamente a la posición plana sobre el piso.  Repítalo __________ veces. Realice este estiramiento __________ veces por día.   AMPLITUD DE MOVIMIENTOS - Extensión - flexión de brazos en posición prona  · Acuéstese sobre el estómago sobre el piso, una cama será muy blanda. Coloque las palmas a una distancia igual al ancho de los hombres y a la altura de la cabeza.  · Mantenga la espalda tan relajada como pueda, enderece lentamente los codos mientras mantiene las caderas contra el suelo. Puede modificar la posición de las manos para estar más cómodo. A medida que gana movimiento, sus manos quedarán más por debajo de los hombros.  · Mantenga cada posición durante __________ segundos.  · Vuelva lentamente a la posición plana sobre el piso.  Repítalo __________ veces. Realice este estiramiento __________ veces por día.   EJERCICIOS DE FORTALECIMIENTO - Ciática  Estos ejercicios le ayudarán en la recuperación de la lesión. Estos ejercicios deben  hacerse cerca de su "punto dulce". Este es el arco neutro, de la parte baja de la espalda, en algún lugar entre la posición completamente redondeada y arqueada plenamente, que es la posición menos dolorosa. Cuando se realiza en este nivel de seguridad del movimiento, estos ejercicios se pueden utilizar para las personas que tienen una lesión basada en flexión o extensión. Con estos ejercicios, los síntomas podrán desaparecer con o sin mayor intervención del profesional, el fisioterapeuta o el entrenador. Al completar estos ejercicios, recuerde:   · Los músculos pueden ganar tanto la resistencia como la fuerza que necesita para sus actividades diarias a través de ejercicios controlados.  · Realice los ejercicios como se lo indicó el médico, el fisioterapeuta o el entrenador. Avance sólo con los ejercicios de resistencia y haga las repeticiones que su médico le indique.  · Podrá experimentar dolor o cansancio   muscular, pero el dolor o molestia que trata de eliminar a través de los ejercicios nunca debe empeorar. Si el dolor empeora, deténgase y asegúrese de que está siguiendo las directivas correctamente. Si aún siente dolor luego de realizar lo ajustes necesarios, deberá discontinuar el ejercicio hasta que pueda conversar con el profesional sobre el problema.  FORTALECIMIENTO - Abdominales profundos - Inclinación pélvica  · Recuéstese sobre una cama firme o sobre el suelo. Mantenga las piernas al frente, doble las rodillas de modo que ambas apunten hacia el techo y los pies queden bien apoyados en el piso.  · Tensione la zona baja de los músculos abdominales para presionar la cintura contra el piso. Este movimiento hará rotar su pelvis de modo que el cóccix quede hacia arriba y no apuntando a los pies o hacia el piso.  · Con una tensión suave y respiración pareja, mantenga esta posición durante __________ segundos.  Repítalo __________ veces. Realice este estiramiento __________ veces por día.   FORTALECIMIENTO -  Abdominales encogimiento abdominal  · Recuéstese sobre una cama firme o sobre el suelo. Mantenga las piernas al frente, doble las rodillas de modo que ambas apunten hacia el techo y los pies queden bien apoyados en el piso. Cruce las manos sobre el pecho.  · Apunte suavemente con la barbilla hacia abajo, sin doblar el cuello.  · Tensione los abdominales y eleve lentamente el tronco la altura suficiente para despegar los omóplatos. Si se eleva más, pondrá tensión excesiva en la cintura y esto no fortalecerá más los abdominales.  · Controle la vuelta a la posición inicial.  Repítalo __________ veces. Realice este estiramiento __________ veces por día.   FORTALECIMIENTO - En cuadrúpedo, elevación de miembro superior e inferior opuestos  · Coloque las manos y las rodillas en una superficie firme. Las manos deben quedar a la altura de los hombros y las rodillas debajo de las caderas. Puede colocar algo debajo las rodillas para estar más cómodo.  · Encuentre la posición neutral de la columna vertebral y tensione ligeramente los músculos abdominales de modo que pueda mantener esta posición. Los hombros y las caderas deben formar un rectángulo paralelo con el suelo y recto.  · Manteniendo el tronco firme, eleve la mano derecha a la altura del hombro y luego eleve la pierna izquierda a la altura de la cadera. Asegúrese de no contener la respiración. Mantenga cada posición durante __________ segundos.  · Con los músculos abdominales en tensión y la espalda firme, vuelva lentamente a la posición inicial. Repita con el otro brazo y la otra pierna.  Repítalo __________ veces. Realice este estiramiento __________ veces por día.   FUERZA Abdominales y cuádriceps - Levantar las piernas rectas  · Recuéstese en una cama dura o sobre el piso, con ambas piernas extendidas al frente.  · Deje una pierna en contacto con el suelo y doble la otra rodilla de manera que el pie quede contra el suelo.  · Encuentre la posición neutral de la  columna vertebral y tensione ligeramente los músculos abdominales de modo que pueda mantener esta posición.  · Levante lentamente la pierna del suelo una 6 pulgadas y cuente hasta 15, asegúrese de no contener la respiración.  · Mantega la columna en posición neutral, y baje lentamente la pierna hasta el suelo.  Repita el ejercicio con cada pierna __________ veces. Realice este estiramiento __________ veces por día.  CONSIDERACIONES ACERCA DE LA POSTURA Y LA MECÁNICA DEL CUERPO Ciática  Si mantiene una postura correcta cuando se   encuentre de pie, sentado o realizando sus actividades, reducirá el estrés en los diferentes tejidos del cuerpo, y permitirá a los tejidos lesionados la posibilidad de curarse y limitar las experiencias dolorosas. A continuación se indican pautas generales para mejorar la postura- Su médico o fisioterapeuta le dará instrucciones específicas según sus necesidades. Al leer estas pautas recuerde:  · Los ejercicios indicados por su médico lo ayudarán a lograr la flexibilidad y la fuerza para mantener las posturas correctas.  · Una postura correcta le proporciona a sus articulaciones el medio óptimo para funcionar bien. Las articulaciones se desgastan menos cuando están sostenidas adecuadamente por una columna vertebral en buena postura. Esto significa que su cuerpo estará más sano y dolerá menos.  · La correcta postura debe practicarse en todas las actividades, especialmente al estar sentado o de pie durante mucho tiempo. También es importante al realizar actividades repetitivas de bajo estrés (tipeo) o una actividad única y pesada.  POSICIONES DE DESCANSO  Tenga en cuenta cuáles son las posturas que más dolor le causan al elegir una posición de descanso. Si siente dolor con las actividades en que deba realizar una flexión (sentarse, inclinarse, detenerse, ponerse en cuclillas), elija una posición que le permita descansar en una postura menos flexionada. Evite curvarse en posición fetal cuando se  encuentre de lado. Si el dolor empeora con las actividades basadas en la extensión (estar de pie durante un tiempo prolongado, trabajar con las manos por arriba de la cabeza) evite descansar en una posición extendida durante mucho tiempo, como dormir sobre el estómago. La mayoría de las personas encontrará cómodo el descanso sobre la columna vertebral en una posición neutral, ni muy redondeada ni muy arqueada. Recuéstese sobre su lado en una cama que no esté hundida con una almohada entre las rodillas o sobre la espalda con una almohada bajo las rodillas, y sentirá alivio. Tenga en cuenta que cualquier posición en tiempo prolongado, no importa si es una postura correcta, puede provocarle rigidez.  POSTURAS CORRECTAS PARA SENTARSE  Con el fin de minimizar el estrés y el malestar en su columna, deberá sentarse con la postura correcta. Esto le ayudará a que el cuerpo esté más sano. Recuperar una buena postura es un proceso gradual. Muchas personas pueden trabajar más cómodas mediante el uso de diferentes soportes hasta que tengan la flexibilidad y la fuerza para mantener esta postura por su cuenta.  Al sentarse con la postura correcta, los oídos deben estar sobre los hombros y los hombros sobre las caderas.  Debe utilizar el respaldo de la silla para apoyar la espalda. La espalda estará en una posición neutral, ligeramente arqueada. Puede colocar una pequeña almohada o toalla doblada en la base de la espalda baja para apoyarse.   Si trabaja en un escritorio, cree un ambiente que le proporciones un buen soporte y una postura erguida. Sin soporte extra, los músculos se fatigan y causan tensión excesiva en las articulaciones y otros tejidos. Tenga en cuenta estas recomendaciones:  SILLA:   · La silla debe poder deslizarse por debajo del escritorio cuando su espalda tome contacto con el respaldo. Esto le permitirá trabajar más cerca.  · La altura de la silla debe permitirle que los ojos tengan el nivel de la parte superior  del monitor y las manos estén más abajo que los codos.  POSICIÓN DEL CUERPO  · Los pies deben tener contacto con el piso. Si no es posible, use un posapies.  · Mantenga las orejas sobre los hombros. Esto reducirá el estrés en   el cuello y en la cintura.  POSTURAS INCORRECTAS PARA SENTARSE  · Si se siente cansado e incapaz de asumir una postura sentada sana, no se encorve ni se hunda. Esto pone una tensión excesiva en los tejidos de su espalda, y causa más daño y dolor. Entre las opciones más saludables se incluyen:  · El uso de más apoyo, como una almohada lumbar.  · Cambio de tareas, a algo que demande una posición vertical o caminar.  · Tomar una breve caminata.  · Recostarse y descansar en una posición neutral.  DE PIE DURANTE UN TIEMPO PROLONGADO E INCLINADO LIGERAMENTE HACIA ADELANTE  Cuando deba realizar una tarea que requiera inclinación hacia adelante estando de pie en el mismo sitio durante mucho tiempo, coloque un pie en un objeto de 2 a 4 pulgadas de alto, para mantener una mejor postura. Cuando ambos pies están en el piso, la zona inferior de la espalda tiene a perder su ligera curvatura hacia adentro. Si esta curva se aplana (o se pronuncia demasiado) la espalda y las articulaciones experimentarán demasiado estrés, se fatigarán más rápidamente y causarán dolor.   POSTURAS CORRECTAS PARA ESTAR DE PIE  Una postura adecuada de pie realizarse en todas las actividades diarias, incluso si sólo toman un momento, como al cepillarse los dientes. Como en la postura de sentado, los oídos deben estar sobre los hombros y los hombros sobre las caderas.  Deberá mantener una ligera tensión en sus músculos abdominales para asegurar la columna vertebral. El cóccix debe apuntar hacia el suelo, no detrás de su cuerpo, por que resulta en una curvatura de la espalda sobre-extendida.   POSTURAS INCORRECTAS PARA ESTAR DE PIE  Las posturas incorrectas para estar de pie incluyen tener la cabeza hacia delante, las rodillas  bloqueadas o una excesiva curvatura de la espalda.  CAMINAR  Camine en una postura erguida. Las orejas, hombros y caderas deben estar alineados.  ACTIVIDAD PROLONGADA EN UNA POSICIÓN FLEXIONADA  Al completar una tarea que requiere que se doble la cintura hacia adelante o inclinarse sobre una superficie baja, trate de encontrar una manera de estabilizar 3 de cada 4 de sus miembros. Puede colocar una mano o el codo en el muslo, o descansar una rodilla en la superficie en la que está apoyado. Esto le proporcionará más estabilidad para que sus músculos no se cansen tan rápidamente.  El mantener las rodillas relajadas, o ligeramente dobladas, también reducirá el estrés en la espalda baja.  TÉCNICAS CORRECTAS PARA LEVANTAR OBJETOS  SI:   · Asumir una postura amplia. Esto le proporcionará más estabilidad y la oportunidad de acercarse lo más posible al objeto que se está levantando.  · Tense los abdominales para asegurar la columna vertebral; luego flexione rodillas y caderas. Manteniendo la espalda en una posición neutral, haga el esfuerzo con los músculos de la pierna.  Levántese con las piernas, manteniendo la espalda derecha.  · Pruebe el peso de los objetos desconocidos antes de tratar de levantarlos.  · Trate de mantener los codos hacia abajo y a los lados, con el fin de obtener la fuerza de los hombros al llevar un objeto.  · Siempre pida ayuda a otra persona cuando deba levantar objetos pesados o incómodos  TÉCNICAS INCORRECTAS PARA LEVANTAR OBJETOS  NO:   · Bloquee rodillas al levantar, aunque sea un objeto pequeño.  · Se doble ni gire. Gire sobre los pies ni los mueva cuando necesite cambiar de dirección.  · Tome conciencia que no puede levantar con seguridad ni un clip de papel,

## 2013-11-11 NOTE — Progress Notes (Signed)
Subjective:    Patient ID: [name], female    DOB: [birth year], 38 y.o.   MRN: [id number]  HPI This chart was scribed for Emir Nack-MD, by Lovena Le Day, Scribe. This patient was seen in room 10 and the patient's care was started at 10:35 AM.  HPI Comments: [name] is a 38 y.o. female who presents to the [facility] complaining of intermittent left lower back pain, onset 4 days ago but worsened when she slipped/fell on her lower back this AM while letting her dogs out of the house. She reports that she has right leg paraesthesias which are chronic to her and have not worsened. The denies any gait abnormalities and no foot drop; denies weakness of either legs, denies genitourinary numbness, no urinary sx or incontinence. 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   She was previously diagnosed with diabetes and STI and was on medicines for both of these problems but has since stopped taking those medicines years ago. She also reports chronic irregular periods her whole life.  Patient Active Problem List   Diagnosis Date Noted  . DIABETES MELLITUS, TYPE II, UNCONTROLLED 06/20/2009  . POLYCYSTIC OVARIAN DISEASE 06/20/2009  . CARPAL TUNNEL SYNDROME 06/20/2009   Past Surgical History  Procedure Laterality Date  . Cesarean section     No family history on file.  History   Social History  . Marital Status: Single    Spouse Name: N/A    Number of Children: N/A  . Years of Education: N/A   Occupational History  . Not on file.   Social History Main Topics  . Smoking status: Never Smoker   . Smokeless tobacco: Not on file  . Alcohol Use: No     Comment: socially  . Drug Use: No  . Sexual Activity: No     Comment: not right now   Other Topics Concern  . Not on file   Social History Narrative  . No narrative on file   No Known  Allergies  Results for orders placed in visit on 11/11/13  GLUCOSE, POCT (MANUAL RESULT ENTRY)      Result Value Range   POC Glucose 254 (*) 70 - 99 mg/dl  POCT GLYCOSYLATED HEMOGLOBIN (HGB A1C)      Result Value Range   Hemoglobin A1C 7.9    POCT URINALYSIS DIPSTICK      Result Value Range   Color, UA red     Clarity, UA clots/cloudy     Glucose, UA >=1000     Bilirubin, UA neg     Ketones, UA trace     Spec Grav, UA 1.020     Blood, UA large     pH, UA 5.5     Protein, UA >=300     Urobilinogen, UA 1.0     Nitrite, UA neg     Leukocytes, UA Negative    POCT URINE PREGNANCY      Result Value Range   Preg Test, Ur Negative     Review of Systems  Constitutional: Negative for fever and chills.  Respiratory: Negative for cough and shortness of breath.   Cardiovascular: Negative for chest pain.  Gastrointestinal: Negative for abdominal pain.  Musculoskeletal: Positive for back pain. Negative for gait problem.      Objective:   Physical Exam  Nursing note and vitals reviewed.  Constitutional: She is oriented to person, place, and time. She appears well-developed and well-nourished. No distress.  HENT:  Head: Normocephalic and atraumatic.  Eyes: Conjunctivae are normal. Right eye exhibits no discharge. Left eye exhibits no discharge.  Neck: Normal range of motion.  Cardiovascular: Normal rate, regular rhythm and normal heart sounds.   No murmur heard. Pulmonary/Chest: Effort normal and breath sounds normal. No respiratory distress. She has no wheezes. She has no rales.  Musculoskeletal: Normal range of motion. She exhibits tenderness. She exhibits no edema.  No midline tenderness to palpation Tender to palpation left paraspinal   Neurological: She is alert and oriented to person, place, and time.  Motor 5/5 bilateral lower extremities. Full ROM lumbar spine SLR negative bilaterally Toe heel and marching intact, sensation intact.  Skin: Skin is warm and dry.   Psychiatric: She has a normal mood and affect. Thought content normal.   Triage Vitals: BP 158/96  Pulse 103  Temp(Src) 98.6 F (37 C) (Oral)  Resp 18  Ht 5\' 2"  (1.575 m)  Wt 262 lb (118.842 kg)  BMI 47.91 kg/m2  SpO2 99%  LMP 09/14/2013  DIAGNOSTIC STUDIES: Oxygen Saturation is 99% on room air, normal by my interpretation.    COORDINATION OF CARE: At 945 AM Discussed treatment plan with patient which includes lumbar X-ray, blood work, pregnancy test. Patient agrees.      Results for orders placed in visit on 11/11/13  GLUCOSE, POCT (MANUAL RESULT ENTRY)      Result Value Range   POC Glucose 254 (*) 70 - 99 mg/dl  POCT GLYCOSYLATED HEMOGLOBIN (HGB A1C)      Result Value Range   Hemoglobin A1C 7.9    POCT URINALYSIS DIPSTICK      Result Value Range   Color, UA red     Clarity, UA clots/cloudy     Glucose, UA >=1000     Bilirubin, UA neg     Ketones, UA trace     Spec Grav, UA 1.020     Blood, UA large     pH, UA 5.5     Protein, UA >=300     Urobilinogen, UA 1.0     Nitrite, UA neg     Leukocytes, UA Negative    POCT URINE PREGNANCY      Result Value Range   Preg Test, Ur Negative     [facility] reading (PRIMARY) by  Dr. Tamala Julian. Lumbar spine films:  NAD   Assessment & Plan:  Lower back pain - Plan: DG Lumbar Spine Complete, POCT glucose (manual entry), POCT glycosylated hemoglobin (Hb A1C), POCT urinalysis dipstick, POCT urine pregnancy, naproxen (EC NAPROSYN) 500 MG EC tablet, cyclobenzaprine (FLEXERIL) 5 MG tablet, CANCELED: POCT UA - Microscopic Only  Leg pain - Plan: DG Lumbar Spine Complete, POCT glucose (manual entry), POCT glycosylated hemoglobin (Hb A1C), POCT urinalysis dipstick, POCT urine pregnancy, CANCELED: POCT UA - Microscopic Only  Type II or unspecified type diabetes mellitus without mention of complication, uncontrolled - Plan: metFORMIN (GLUCOPHAGE) 500 MG tablet, Ambulatory referral to diabetic education, CBC with Differential, Comprehensive  metabolic panel, TSH, Microalbumin, urine  Irregular menses  Sciatica  Family planning counseling  1.  Lower back pain/lumbar strain with radiculopathy:  New.  Rx for Naproxen bid, Flexeril qhs.  Home exercise program provided to perform daily.  If no improvement in two weeks, call office for referral to ortho. 2.  DMII: uncontrolled; rx for Metformin 500mg  bid; refer to diabetic education.  Obtain labs including  urine microalbumin. 3.  Irregular menses:  Chronic; recommend further weight loss; Metformin should help normalize menses as well.  History of PCOS. 4.  Family Planning: desires pregnancy; encourage improved glycemic control, weight loss.  Start PNV.  Meds ordered this encounter  Medications  . metFORMIN (GLUCOPHAGE) 500 MG tablet    Sig: Take 1 tablet (500 mg total) by mouth 2 (two) times daily with a meal.    Dispense:  60 tablet    Refill:  5  . naproxen (EC NAPROSYN) 500 MG EC tablet    Sig: Take 1 tablet (500 mg total) by mouth 2 (two) times daily with a meal.    Dispense:  40 tablet    Refill:  0  . cyclobenzaprine (FLEXERIL) 5 MG tablet    Sig: Take 1 tablet (5 mg total) by mouth at bedtime.    Dispense:  30 tablet    Refill:  0    I personally performed the services described in this documentation, which was scribed in my presence.  The recorded information has been reviewed and is accurate.  Reginia Forts, M.D.  [facility] [address] [place], [place]  [address] [phone] phone [phone] fax

## 2013-11-12 ENCOUNTER — Ambulatory Visit: Payer: Self-pay | Admitting: Obstetrics

## 2013-11-12 ENCOUNTER — Ambulatory Visit (INDEPENDENT_AMBULATORY_CARE_PROVIDER_SITE_OTHER): Payer: BC Managed Care – PPO | Admitting: Obstetrics

## 2013-11-12 ENCOUNTER — Encounter: Payer: Self-pay | Admitting: Obstetrics

## 2013-11-12 VITALS — BP 135/74 | HR 72 | Temp 97.9°F | Ht 63.0 in | Wt 267.0 lb

## 2013-11-12 DIAGNOSIS — Z0000 Encounter for general adult medical examination without abnormal findings: Secondary | ICD-10-CM

## 2013-11-12 DIAGNOSIS — Z01419 Encounter for gynecological examination (general) (routine) without abnormal findings: Secondary | ICD-10-CM

## 2013-11-12 DIAGNOSIS — N926 Irregular menstruation, unspecified: Secondary | ICD-10-CM | POA: Insufficient documentation

## 2013-11-12 MED ORDER — PRENATAL PLUS 27-1 MG PO TABS: 1.0000 | ORAL_TABLET | Freq: Every day | ORAL | Status: DC

## 2013-11-12 NOTE — Progress Notes (Signed)
Subjective:     [name] is a 38 y.o. female here for a routine exam.  Current complaints:Patient is wanting to conceive, has concerns about her ability to conceive.  Personal health questionnaire reviewed: yes.   Gynecologic History Patient's last menstrual period was 11/05/2013. Contraception: none Last Pap: 2013. Results were: normal  Obstetric History OB History  Gravida Para Term Preterm AB SAB TAB Ectopic Multiple Living  2 2 1 1      2     # Outcome Date GA Lbr Len/2nd Weight Sex Delivery Anes PTL Lv  2 PRE 12/22/93 [redacted]w[redacted]d  3 lb 9 oz (1.616 kg) M LTCS   Y  1 TRM 12/03/92 [redacted]w[redacted]d  6 lb 6 oz (2.892 kg) F SVD None  Y       The following portions of the patient's history were reviewed and updated as appropriate: allergies, current medications, past family history, past medical history, past social history, past surgical history and problem list.  Review of Systems Pertinent items are noted in HPI.    Objective:    General appearance: alert and no distress Breasts: normal appearance, no masses or tenderness Abdomen: normal findings: soft, non-tender    Assessment:    Healthy female exam.   Preconception counseling done.  Irregular cycles.  May need OCP's for cycle regulation.  ? PCOS.   Plan:    Education reviewed: self breast exams and Folic acid supplementation and preconception counseling.. Contraception: none. Follow up in: 4 months.

## 2013-11-12 NOTE — Addendum Note (Signed)
Addended by: Lewie Loron D on: 11/12/2013 02:33 PM   Modules accepted: Orders

## 2013-11-13 LAB — PAP IG W/ RFLX HPV ASCU

## 2013-11-13 LAB — WET PREP BY MOLECULAR PROBE
CANDIDA SPECIES: NEGATIVE
Gardnerella vaginalis: NEGATIVE
Trichomonas vaginosis: NEGATIVE

## 2013-11-13 LAB — GC/CHLAMYDIA PROBE AMP
CT Probe RNA: NEGATIVE
GC Probe RNA: NEGATIVE

## 2013-11-17 NOTE — Progress Notes (Signed)
Patient is coming in to see someone tonight or tomorrow.

## 2013-12-22 ENCOUNTER — Ambulatory Visit (INDEPENDENT_AMBULATORY_CARE_PROVIDER_SITE_OTHER): Payer: BC Managed Care – PPO | Admitting: Internal Medicine

## 2013-12-22 VITALS — BP 132/88 | HR 75 | Temp 98.0°F | Resp 16 | Ht 62.0 in | Wt 264.2 lb

## 2013-12-22 DIAGNOSIS — IMO0001 Reserved for inherently not codable concepts without codable children: Secondary | ICD-10-CM

## 2013-12-22 DIAGNOSIS — E1165 Type 2 diabetes mellitus with hyperglycemia: Secondary | ICD-10-CM

## 2013-12-22 DIAGNOSIS — M549 Dorsalgia, unspecified: Secondary | ICD-10-CM

## 2013-12-22 DIAGNOSIS — N926 Irregular menstruation, unspecified: Secondary | ICD-10-CM

## 2013-12-22 DIAGNOSIS — N946 Dysmenorrhea, unspecified: Secondary | ICD-10-CM

## 2013-12-22 DIAGNOSIS — R109 Unspecified abdominal pain: Secondary | ICD-10-CM

## 2013-12-22 LAB — POCT GLYCOSYLATED HEMOGLOBIN (HGB A1C): HEMOGLOBIN A1C: 7.3

## 2013-12-22 LAB — POCT URINALYSIS DIPSTICK
BILIRUBIN UA: NEGATIVE
KETONES UA: NEGATIVE
Leukocytes, UA: NEGATIVE
Nitrite, UA: NEGATIVE
Protein, UA: NEGATIVE
SPEC GRAV UA: 1.02
UROBILINOGEN UA: 0.2
pH, UA: 5

## 2013-12-22 LAB — POCT UA - MICROSCOPIC ONLY
BACTERIA, U MICROSCOPIC: NEGATIVE
CASTS, UR, LPF, POC: NEGATIVE
Crystals, Ur, HPF, POC: NEGATIVE
Mucus, UA: NEGATIVE
Yeast, UA: NEGATIVE

## 2013-12-22 LAB — GLUCOSE, POCT (MANUAL RESULT ENTRY): POC Glucose: 232 mg/dl — AB (ref 70–99)

## 2013-12-22 LAB — POCT URINE PREGNANCY: PREG TEST UR: NEGATIVE

## 2013-12-22 MED ORDER — METFORMIN HCL 500 MG PO TABS: 1000.0000 mg | ORAL_TABLET | Freq: Two times a day (BID) | ORAL | Status: DC

## 2013-12-22 MED ORDER — MELOXICAM 15 MG PO TABS: 15.0000 mg | ORAL_TABLET | Freq: Every day | ORAL | Status: DC

## 2013-12-22 MED ORDER — METHOCARBAMOL 750 MG PO TABS: 750.0000 mg | ORAL_TABLET | Freq: Every day | ORAL | Status: DC

## 2013-12-22 NOTE — Progress Notes (Signed)
Subjective:  This chart was scribed for Tami Lin, MD by Roxan Diesel, ED scribe.  This patient was seen in [place] [facility] and the patient's care was started at 8:50 AM.   Patient ID: [name], female    DOB: 1976-06-11, 38 y.o.   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  1. Irregular menstrual cycle - Pt reports that she has not had a regular period for the past 2 months.  Last month she was having some spotting prior to her normal time, but this never progressed into a full period.  She also notes some occasional blood that comes from vagina when she urinates over the last 12 hours.  She has not noticed any other vaginal bleeding. She has had mild cramps overnight.   Pt was seen here on 1/15 Dr. Tamala Julian for irregular menstrual cycles and was judged normal with a history of polycystic ovary disease. Her diabetes was uncontrolled because she was off medication and so she was restarted on metformin. She then followed up with her gynecologist Dr. Jodi Mourning who did an evaluation and also felt her to be healthy. He plans followup 4 mo. Pap was normal.  2. Back pain - She reports several months of intermittent bilateral lower back pain.  Pain is worsened by walking and bending over and is relieved by sitting.  It does not prevent her from sleeping.  It is localized to her right lower back and radiates into her right thigh occasionally. She did not respond to the medicine she was given in January.  3. DM - She denies numbness or tingling in feet at night.  She states that she sometimes has cramping in her calves at night after driving all day. The regular use of medication.  4. Knee pain - She notes that she occasionally has a feeling in her left kneecap "like something is moving."  This is particularly brought on when she is stepping off of a bus.  5. She  also reports occasional urinary frequency and states that yesterday she had to urinate 3 times over a few hours, even though she was not drinking any fluids.  Pt is sexually active and does not use birth control.  She states that she wants to have another child. 2 others 19,20.  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  She reports she has been trying to lose weight and lost 65 pounds recently.  Pt last ate yesterday.   Patient Active Problem List   Diagnosis Date Noted  . Irregular menstrual cycle 11/12/2013  . DIABETES MELLITUS, TYPE II, UNCONTROLLED 06/20/2009  . POLYCYSTIC OVARIAN DISEASE 06/20/2009  . CARPAL TUNNEL SYNDROME 06/20/2009    Past Medical History  Diagnosis Date  . Hypertension   . Diabetes mellitus     Past Surgical History  Procedure Laterality Date  . Cesarean section      Prior to Admission medications   Medication Sig Start Date End Date Taking? Authorizing Provider  metFORMIN (GLUCOPHAGE) 500 MG tablet Take 1 tablet (500 mg total) by mouth 2 (two) times daily with a meal. 11/11/13  Yes Wardell Honour, MD  cyclobenzaprine (FLEXERIL) 5 MG tablet Take 1 tablet (5 mg total) by mouth at bedtime. 11/11/13   Wardell Honour, MD  naproxen (EC NAPROSYN) 500  MG EC tablet Take 1 tablet (500 mg total) by mouth 2 (two) times daily with a meal. 11/11/13   Wardell Honour, MD  prenatal vitamin w/FE, FA (PRENATAL 1 + 1) 27-1 MG TABS tablet Take 1 tablet by mouth daily before breakfast. 11/12/13   Shelly Bombard, MD     Review of Systems  Genitourinary: Positive for frequency, hematuria (occasionally notices a small amount of blood in urine) and menstrual problem. Negative for vaginal bleeding.  Musculoskeletal: Positive for arthralgias (left knee), back pain and myalgias (cramping in calves at night, after driving all day).  Neurological: Negative for numbness.       Objective:   Physical Exam  Nursing note and vitals  reviewed. Constitutional: She is oriented to person, place, and time. She appears well-developed and well-nourished. No distress.  Obese  HENT:  Head: Normocephalic and atraumatic.  Eyes: EOM are normal. Pupils are equal, round, and reactive to light.  Neck: Neck supple. No thyromegaly present.  Cardiovascular: Normal rate, regular rhythm and normal heart sounds.   Pulmonary/Chest: Effort normal. No respiratory distress.  Abdominal: Soft. She exhibits no mass. There is no tenderness.  Obese  Musculoskeletal:  Generally tender over entire bilateral lumbosacral area.  Pain increases with twisting and forward flexion.  Reflexes are normal. Straight leg raise is negative. No sensory losses in the legs or feet  The left knee has some crepitus with patellar ballottement but is otherwise normal   Lymphadenopathy:    She has no cervical adenopathy.  Neurological: She is alert and oriented to person, place, and time.  Skin: Skin is warm and dry.  Psychiatric: She has a normal mood and affect. Her behavior is normal.   Results for orders placed in visit on 12/22/13  POCT UA - MICROSCOPIC ONLY      Result Value Ref Range   WBC, Ur, HPF, POC 0-1     RBC, urine, microscopic 12-15     Bacteria, U Microscopic neg     Mucus, UA neg     Epithelial cells, urine per micros 3-5     Crystals, Ur, HPF, POC neg     Casts, Ur, LPF, POC neg     Yeast, UA neg    POCT URINALYSIS DIPSTICK      Result Value Ref Range   Color, UA yellow     Clarity, UA sl cloudy     Glucose, UA >=1000     Bilirubin, UA neg     Ketones, UA neg     Spec Grav, UA 1.020     Blood, UA large     pH, UA 5.0     Protein, UA neg     Urobilinogen, UA 0.2     Nitrite, UA neg     Leukocytes, UA Negative    POCT URINE PREGNANCY      Result Value Ref Range   Preg Test, Ur Negative    POCT GLYCOSYLATED HEMOGLOBIN (HGB A1C)      Result Value Ref Range   Hemoglobin A1C 7.3    GLUCOSE, POCT (MANUAL RESULT ENTRY)      Result  Value Ref Range   POC Glucose 232 (*) 70 - 99 mg/dl          Assessment & Plan:  Flank pain - Plan: POCT UA - Microscopic Only, POCT urinalysis dipstick Dysmenorrhea -not unusual for her periods Plan: POCT urine pregnancy  Type II or unspecified type diabetes mellitus without mention of complication,  uncontrolled - metFORMIN (GLUCOPHAGE) 500 MG tablet-increased to 2 tablets twice a day//has followup with Dr. Tamala Julian in 2 months  Back pain - Plan: Ambulatory referral to Physical Therapy////this is unhelpful we will consider referral to orthopedics  Menses, irregular--- likely secondary to PCO S.--This was following for now with no additional treatment/has followup with Dr. Jodi Mourning in 3 months  Meds ordered this encounter  Medications  . metFORMIN (GLUCOPHAGE) 500 MG tablet    Sig: Take 2 tablets (1,000 mg total) by mouth 2 (two) times daily with a meal.    Dispense:  120 tablet    Refill:  2  . meloxicam (MOBIC) 15 MG tablet    Sig: Take 1 tablet (15 mg total) by mouth daily. For back pain    Dispense:  30 tablet    Refill:  1  . methocarbamol (ROBAXIN-750) 750 MG tablet    Sig: Take 1 tablet (750 mg total) by mouth at bedtime.    Dispense:  30 tablet    Refill:  1       I have completed the patient encounter in its entirety as documented by the scribe, with editing by me where necessary. Sorcha Rotunno P. Laney Pastor, M.D.

## 2013-12-22 NOTE — Patient Instructions (Signed)
systane

## 2013-12-24 ENCOUNTER — Ambulatory Visit: Payer: Self-pay | Admitting: *Deleted

## 2013-12-25 ENCOUNTER — Encounter: Payer: Self-pay | Admitting: Dietician

## 2013-12-25 ENCOUNTER — Encounter: Payer: BC Managed Care – PPO | Attending: Family Medicine | Admitting: Dietician

## 2013-12-25 VITALS — Ht 62.0 in | Wt 267.3 lb

## 2013-12-25 DIAGNOSIS — E119 Type 2 diabetes mellitus without complications: Secondary | ICD-10-CM | POA: Insufficient documentation

## 2013-12-25 DIAGNOSIS — IMO0001 Reserved for inherently not codable concepts without codable children: Secondary | ICD-10-CM

## 2013-12-25 DIAGNOSIS — E1165 Type 2 diabetes mellitus with hyperglycemia: Secondary | ICD-10-CM

## 2013-12-25 DIAGNOSIS — Z713 Dietary counseling and surveillance: Secondary | ICD-10-CM | POA: Insufficient documentation

## 2013-12-25 NOTE — Patient Instructions (Signed)
Goals:  Follow Diabetes Meal Plan as instructed  Eat 3 meals and 2 snacks, every 3-5 hrs  Fill half of your plate with vegetables  Have protein (baked, broiled, or grilled) with each meal or snack.  Add lean protein foods to meals/snacks (such as string cheese or meat).   Monitor glucose levels as instructed by your doctor  Aim for at least 30 mins of physical activity daily  Bring food record and glucose log to your next nutrition visit if you start testing your sugar.

## 2013-12-25 NOTE — Progress Notes (Signed)
Appt start time: 1300 end time:  1430.   Assessment:  Patient was seen on  12/25/2013 for individual diabetes education. States that she has lost weight (about 50 lbs) sand exercises 2 x day. Stopped eating fast foods Currently not testing her blood sugar.   Lives by herself by her boyfriend spends the weekends with her.   Current HbA1c: 7.9% on 11/11/2013  Preferred Learning Style:   No preference indicated   Learning Readiness:   Ready  MEDICATIONS: see list  DIETARY INTAKE:  Avoided foods include: red meat, pork, fried foods   24-hr recall:  B (5-5:30AM): 2 egg with 2% milk (sometimes will add sugar) Snk ( AM): none L ( PM): 2 eggs on a sandwich or chicken and soup with water or juice Snk ( PM): usually nothing D ( PM): sandwich with milk  Snk ( PM): strawberries Beverages: water and 3 glasses of milk or juice  Usual physical activity: 2 x day for 60 minutes each at [employer]  Estimated energy needs: 180 calories 200 g carbohydrates 135 g protein 50 g fat  Progress Towards Goal(s):  In progress.   Nutritional Diagnosis:  [place]-2.1 Inpaired nutrition utilization As related to glucose metabolism.  As evidenced by Hgb A1c of 7.9%.    Intervention:  Nutrition counseling provided.  Discussed diabetes disease process and treatment options.  Discussed physiology of diabetes and role of obesity on insulin resistance.  Encouraged moderate weight reduction to improve glucose levels.  Discussed role of medications and diet in glucose control  Provided education on macronutrients on glucose levels.  Provided education on carb counting, importance of regularly scheduled meals/snacks, and meal planning  Discussed effects of physical activity on glucose levels and long-term glucose control.  Recommended 150 minutes of physical activity/week.  Reviewed patient medications.  Discussed role of medication on blood glucose and possible side effects  Discussed blood glucose  monitoring and interpretation.  Discussed recommended target ranges and individual ranges.    Described short-term complications: hyper- and hypo-glycemia.  Discussed causes,symptoms, and treatment options.  Discussed prevention, detection, and treatment of long-term complications.  Discussed the role of prolonged elevated glucose levels on body systems.  Discussed role of stress on blood glucose levels and discussed strategies to manage psychosocial issues.  Discussed recommendations for long-term diabetes self-care.  Established checklist for medical, dental, and emotional self-care.  Teaching Method Utilized:  Visual Auditory Hands on  Handouts given during visit include:  Living Well with Diabetes  Yellow Card  MyPlate Handout  15 g CHO snacks  Diabetes Care Schedule  Blood Glucose Testing  Barriers to learning/adherence to lifestyle change: None  Diabetes self-care support plan:   [facility] support group  Boyfriend  Demonstrated degree of understanding via:  Teach Back   Monitoring/Evaluation:  Dietary intake, exercise, and body weight in 2 month(s).

## 2013-12-30 ENCOUNTER — Ambulatory Visit: Payer: BC Managed Care – PPO | Attending: Internal Medicine

## 2013-12-30 DIAGNOSIS — M6281 Muscle weakness (generalized): Secondary | ICD-10-CM | POA: Diagnosis not present

## 2013-12-30 DIAGNOSIS — R293 Abnormal posture: Secondary | ICD-10-CM | POA: Insufficient documentation

## 2013-12-30 DIAGNOSIS — M545 Low back pain, unspecified: Secondary | ICD-10-CM | POA: Diagnosis not present

## 2013-12-30 DIAGNOSIS — IMO0001 Reserved for inherently not codable concepts without codable children: Secondary | ICD-10-CM | POA: Insufficient documentation

## 2014-01-06 ENCOUNTER — Ambulatory Visit: Payer: BC Managed Care – PPO | Admitting: Physical Therapy

## 2014-01-06 DIAGNOSIS — IMO0001 Reserved for inherently not codable concepts without codable children: Secondary | ICD-10-CM | POA: Diagnosis not present

## 2014-01-11 ENCOUNTER — Ambulatory Visit: Payer: BC Managed Care – PPO | Admitting: Physical Therapy

## 2014-01-11 DIAGNOSIS — IMO0001 Reserved for inherently not codable concepts without codable children: Secondary | ICD-10-CM | POA: Diagnosis not present

## 2014-01-13 ENCOUNTER — Ambulatory Visit: Payer: BC Managed Care – PPO | Admitting: Physical Therapy

## 2014-01-13 DIAGNOSIS — IMO0001 Reserved for inherently not codable concepts without codable children: Secondary | ICD-10-CM | POA: Diagnosis not present

## 2014-01-18 ENCOUNTER — Encounter: Payer: BC Managed Care – PPO | Admitting: Physical Therapy

## 2014-01-20 ENCOUNTER — Ambulatory Visit: Payer: BC Managed Care – PPO | Admitting: Physical Therapy

## 2014-01-25 ENCOUNTER — Ambulatory Visit: Payer: BC Managed Care – PPO | Admitting: Physical Therapy

## 2014-01-25 DIAGNOSIS — IMO0001 Reserved for inherently not codable concepts without codable children: Secondary | ICD-10-CM | POA: Diagnosis not present

## 2014-01-27 ENCOUNTER — Ambulatory Visit: Payer: BC Managed Care – PPO | Attending: Internal Medicine | Admitting: Physical Therapy

## 2014-01-27 DIAGNOSIS — R293 Abnormal posture: Secondary | ICD-10-CM | POA: Diagnosis not present

## 2014-01-27 DIAGNOSIS — M545 Low back pain, unspecified: Secondary | ICD-10-CM | POA: Diagnosis not present

## 2014-01-27 DIAGNOSIS — M6281 Muscle weakness (generalized): Secondary | ICD-10-CM | POA: Diagnosis not present

## 2014-01-27 DIAGNOSIS — IMO0001 Reserved for inherently not codable concepts without codable children: Secondary | ICD-10-CM | POA: Diagnosis present

## 2014-02-01 ENCOUNTER — Encounter: Payer: BC Managed Care – PPO | Admitting: Physical Therapy

## 2014-02-01 ENCOUNTER — Ambulatory Visit: Payer: BC Managed Care – PPO | Admitting: Physical Therapy

## 2014-02-01 DIAGNOSIS — IMO0001 Reserved for inherently not codable concepts without codable children: Secondary | ICD-10-CM | POA: Diagnosis not present

## 2014-02-03 ENCOUNTER — Encounter: Payer: BC Managed Care – PPO | Admitting: Physical Therapy

## 2014-02-08 ENCOUNTER — Encounter: Payer: BC Managed Care – PPO | Admitting: Physical Therapy

## 2014-02-10 ENCOUNTER — Encounter: Payer: BC Managed Care – PPO | Admitting: Physical Therapy

## 2014-02-11 ENCOUNTER — Ambulatory Visit: Payer: BC Managed Care – PPO

## 2014-02-11 ENCOUNTER — Ambulatory Visit (INDEPENDENT_AMBULATORY_CARE_PROVIDER_SITE_OTHER): Payer: BC Managed Care – PPO | Admitting: Family Medicine

## 2014-02-11 VITALS — BP 142/80 | HR 81 | Temp 98.5°F | Resp 16 | Ht 62.5 in | Wt 268.0 lb

## 2014-02-11 DIAGNOSIS — E119 Type 2 diabetes mellitus without complications: Secondary | ICD-10-CM

## 2014-02-11 DIAGNOSIS — M25562 Pain in left knee: Secondary | ICD-10-CM

## 2014-02-11 DIAGNOSIS — E1165 Type 2 diabetes mellitus with hyperglycemia: Secondary | ICD-10-CM

## 2014-02-11 DIAGNOSIS — M25569 Pain in unspecified knee: Secondary | ICD-10-CM

## 2014-02-11 DIAGNOSIS — M5432 Sciatica, left side: Secondary | ICD-10-CM

## 2014-02-11 DIAGNOSIS — Z3200 Encounter for pregnancy test, result unknown: Secondary | ICD-10-CM

## 2014-02-11 DIAGNOSIS — M5430 Sciatica, unspecified side: Secondary | ICD-10-CM

## 2014-02-11 DIAGNOSIS — IMO0001 Reserved for inherently not codable concepts without codable children: Secondary | ICD-10-CM

## 2014-02-11 LAB — POCT CBC
GRANULOCYTE PERCENT: 56.4 % (ref 37–80)
HEMATOCRIT: 42.2 % (ref 37.7–47.9)
HEMOGLOBIN: 13.7 g/dL (ref 12.2–16.2)
Lymph, poc: 3.4 (ref 0.6–3.4)
MCH, POC: 28.3 pg (ref 27–31.2)
MCHC: 32.5 g/dL (ref 31.8–35.4)
MCV: 87.2 fL (ref 80–97)
MID (cbc): 0.6 (ref 0–0.9)
MPV: 9.1 fL (ref 0–99.8)
POC Granulocyte: 5.2 (ref 2–6.9)
POC LYMPH %: 36.9 % (ref 10–50)
POC MID %: 6.7 %M (ref 0–12)
Platelet Count, POC: 349 10*3/uL (ref 142–424)
RBC: 4.84 M/uL (ref 4.04–5.48)
RDW, POC: 13.4 %
WBC: 9.2 10*3/uL (ref 4.6–10.2)

## 2014-02-11 LAB — POCT URINALYSIS DIPSTICK
BILIRUBIN UA: NEGATIVE
GLUCOSE UA: 500
Ketones, UA: NEGATIVE
LEUKOCYTES UA: NEGATIVE
NITRITE UA: NEGATIVE
PH UA: 7
Protein, UA: NEGATIVE
Spec Grav, UA: 1.015
Urobilinogen, UA: 0.2

## 2014-02-11 LAB — GLUCOSE, POCT (MANUAL RESULT ENTRY): POC Glucose: 243 mg/dl — AB (ref 70–99)

## 2014-02-11 LAB — POCT URINE PREGNANCY: Preg Test, Ur: NEGATIVE

## 2014-02-11 NOTE — Progress Notes (Signed)
Subjective:    Patient ID: [name], female    DOB: 01/31/1976, 38 y.o.   MRN: [id number]  02/11/2014  Leg Pain and Allergies   HPI This 38 y.o. female presents for three month follow-up for leg pain.  Evaluated in 10/2013 for lower back pain with radiation into leg. Prescribed naproxen and flexeril without improvement.  Followed up in 11/2013 with Dr. Laney Pastor who referred to PT; completed PT twice weekly for four weeks.  Having knee L pain and L lateral thigh pain and L lateral calf pain.  Pain with cross leg while sitting.  No swelling of knee; no giving out of L knee.  Feels like L knee going to give out.  +burning in LLE.  Feels pulling sensation.  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  Stepping down really bothers knee; extreme flexion makes knee pain worse.  Leg really heavy upon awakening in the morning.  Laying on L leg at night hurts.  Walking especially going down stairs causes pain.  Doing home PT exercises three times per day with persistent symptoms.  DMII: Dr. Laney Pastor increased Metformin to 2 bid at visit in 11/2013.  Having diarrhea with increased Metformin so now only taking one tablet bid.  Ate [place] food this morning; knows that sugar will be high today.  LMP last week.  Review of Systems  Constitutional: Negative for fever, chills, diaphoresis and fatigue.  Respiratory: Negative for cough, shortness of breath and stridor.   Cardiovascular: Negative for chest pain, palpitations and leg swelling.  Endocrine: Negative for cold intolerance, heat intolerance, polydipsia, polyphagia and polyuria.  Musculoskeletal: Positive for arthralgias, back pain and myalgias. Negative for joint swelling.  Skin: Negative for color change, pallor, rash and wound.  Neurological: Positive for numbness. Negative for dizziness, tremors, seizures, syncope, facial asymmetry, speech difficulty, weakness, light-headedness and headaches.    Past Medical History  Diagnosis  Date  . Hypertension   . Diabetes mellitus    No Known Allergies Current Outpatient Prescriptions  Medication Sig Dispense Refill  . meloxicam (MOBIC) 15 MG tablet Take 1 tablet (15 mg total) by mouth daily. For back pain  30 tablet  1  . metFORMIN (GLUCOPHAGE) 500 MG tablet Take 2 tablets (1,000 mg total) by mouth 2 (two) times daily with a meal.  120 tablet  2  . methocarbamol (ROBAXIN-750) 750 MG tablet Take 1 tablet (750 mg total) by mouth at bedtime.  30 tablet  1  . prenatal vitamin w/FE, FA (PRENATAL 1 + 1) 27-1 MG TABS tablet Take 1 tablet by mouth daily before breakfast.  30 each  11   No current facility-administered medications for this visit.   History   Social History  . Marital Status: Single    Spouse Name: N/A    Number of Children: N/A  . Years of Education: N/A   Occupational History  . Not on file.   Social History Main Topics  . Smoking status: Never Smoker   . Smokeless tobacco: Not on file  . Alcohol Use: Yes     Comment: socially  . Drug Use: No  . Sexual Activity: Yes    Partners: Male    Birth Control/ Protection: None   Other Topics Concern  . Not on file   Social History Narrative  . No narrative on file       Objective:    BP 142/80  Pulse 81  Temp(Src) 98.5 F (36.9 C) (Oral)  Resp 16  Ht 5' 2.5" (1.588 m)  Wt 268 lb (121.564 kg)  BMI 48.21 kg/m2  SpO2 98%  LMP 02/02/2014 Physical Exam  Nursing note and vitals reviewed. Constitutional: She is oriented to person, place, and time. She appears well-developed and well-nourished. No distress.  HENT:  Head: Normocephalic and atraumatic.  Eyes: Conjunctivae are normal. Pupils are equal, round, and reactive to light.  Neck: Normal range of motion. Neck supple.  Cardiovascular: Normal rate, regular rhythm and normal heart sounds.  Exam reveals no gallop and no friction rub.   No murmur heard. Pulmonary/Chest: Effort normal and breath sounds normal. She has no wheezes. She has no  rales.  Musculoskeletal:       Left hip: She exhibits normal range of motion, normal strength, no tenderness and no bony tenderness.       Left knee: She exhibits normal range of motion, no swelling and no bony tenderness. No tenderness found. No medial joint line, no lateral joint line, no MCL, no LCL and no patellar tendon tenderness noted.       Lumbar back: She exhibits normal range of motion, no tenderness, no bony tenderness, no pain and no spasm.  Lumbar spine: straight leg raises negative; motor 5/5.   Neurological: She is alert and oriented to person, place, and time.  Skin: She is not diaphoretic.  Psychiatric: She has a normal mood and affect. Her behavior is normal.   Results for orders placed in visit on 02/11/14  GLUCOSE, POCT (MANUAL RESULT ENTRY)      Result Value Ref Range   POC Glucose 243 (*) 70 - 99 mg/dl  POCT URINE PREGNANCY      Result Value Ref Range   Preg Test, Ur Negative    POCT URINALYSIS DIPSTICK      Result Value Ref Range   Color, UA yellow     Clarity, UA cloudy     Glucose, UA 500     Bilirubin, UA neg     Ketones, UA neg     Spec Grav, UA 1.015     Blood, UA large     pH, UA 7.0     Protein, UA neg     Urobilinogen, UA 0.2     Nitrite, UA neg     Leukocytes, UA Negative    POCT CBC      Result Value Ref Range   WBC 9.2  4.6 - 10.2 K/uL   Lymph, poc 3.4  0.6 - 3.4   POC LYMPH PERCENT 36.9  10 - 50 %L   MID (cbc) 0.6  0 - 0.9   POC MID % 6.7  0 - 12 %M   POC Granulocyte 5.2  2 - 6.9   Granulocyte percent 56.4  37 - 80 %G   RBC 4.84  4.04 - 5.48 M/uL   Hemoglobin 13.7  12.2 - 16.2 g/dL   HCT, POC 42.2  37.7 - 47.9 %   MCV 87.2  80 - 97 fL   MCH, POC 28.3  27 - 31.2 pg   MCHC 32.5  31.8 - 35.4 g/dL   RDW, POC 13.4     Platelet Count, POC 349  142 - 424 K/uL   MPV 9.1  0 - 99.8 fL   [facility] reading (PRIMARY) by  Dr. Tamala Julian.  L KNEE:  NAD      Assessment & Plan:  Left knee pain - Plan: DG Knee Complete 4 Views Left  DM (diabetes  mellitus) - Plan: POCT glucose (manual entry), POCT urinalysis dipstick, POCT CBC, COMPLETE METABOLIC PANEL WITH GFR  Possible pregnancy - Plan: POCT urine pregnancy  1.  L knee pain/strain:  New/worsening; persistent; pain with extreme flexion of L knee and with weight bearing; refer to ortho for consultation; s/p PT, NSAIDs without improvement. 2.  L sciatica:  Persistent; refer to ortho; having burning pain lateral aspect of LLE.   3. DMII: uncontrolled; increase Metformin to 500mg  tid with meals; RTC three months for follow-up.  No orders of the defined types were placed in this encounter.    No Follow-up on file.   Reginia Forts, M.D.  [facility] [address] [place], [place]  [address] [phone] phone [phone] fax

## 2014-02-11 NOTE — Patient Instructions (Signed)
Iliotibial Band Syndrome  with Rehab  The iliotibial (IT) band is a tendon that connects the hip muscles to the shinbone (tibia) and to one of the bones of the pelvis (ileum). The IT band passes by the knee and is often irritated by the outer portion of the knee (lateral femoral condyle). A fluid filled sac (bursa) exists between the tendon and the bone, to cushion and reduce friction. Overuse of the tendon may cause excessive friction, which results in IT band syndrome. This condition involves inflammation of the bursa (bursitis) and/or inflammation of the IT band (tendinitis).  SYMPTOMS   · Pain, tenderness, swelling, warmth, or redness over the IT band, at the outer knee (above the joint).  · Pain that travels up or down the thigh or leg.  · Initially, pain at the beginning of an exercise, that decreases once warmed up. Eventually, pain throughout the activity, getting worse as the activity continues. May cause the athlete to stop in the middle of training or competing.  · Pain that gets worse when running down hills or stairs, on banked tracks, or next to the curb on the street.  · Pain that increases when the foot of the affected leg hits the ground.  · Possibly, a crackling sound (crepitation) when the tendon or bursa is moved or touched.  CAUSES   IT band syndrome is caused by irritation of the IT band and the underlying bursa. This eventually results in inflammation and pain. IT band syndrome is an overuse injury.   RISK INCREASES WITH:  · Sports with repetitive knee-bending activities (distance running, cycling).  · Incorrect training techniques, including sudden changes in the intensity, frequency, or duration of training.  · Not enough rest between workouts.  · Poor strength and flexibility, especially a tight IT band.  · Failure to warm up properly before activity.  · Bow legs.  · Arthritis of the knee.  PREVENTION   · Warm up and stretch properly before activity.  · Allow for adequate recovery between  workouts.  · Maintain physical fitness:  · Strength, flexibility, and endurance.  · Cardiovascular fitness.  · Learn and use proper training technique, including reducing running mileage, shortening stride, and avoiding running on hills and banked surfaces.  · Wear arch supports (orthotics), if you have flat feet.  PROGNOSIS   If treated properly, IT band syndrome usually goes away within 6 weeks of treatment.  RELATED COMPLICATIONS   · Longer healing time, if not properly treated, or if not given enough time to heal.  · Recurring inflammation of the tendon and bursa, that may result in a chronic condition.  · Recurring symptoms, if activity is resumed too soon, with overuse, with a direct blow, or with poor training technique.  · Inability to complete training or competition.  TREATMENT   Treatment first involves the use of ice and medicine, to reduce pain and inflammation. The use of strengthening and stretching exercises may help reduce pain with activity. These exercises may be performed at home or with a therapist. For individuals with flat feet, an arch support (orthotic) may be helpful. Some individuals find that wearing a knee sleeve or compression bandage around the knee during workouts provides some relief. Certain training techniques, such as adjusting stride length, avoiding running on hills or stairs, changing the direction you run on a circular or banked track, or changing the side of the road you run on, if you run next to the curb, may help decrease symptoms   of IT band syndrome. Cyclists may need to change the seat height or foot position on their bicycles. An injection of cortisone into the bursa may be recommended. Surgery to remove the inflamed bursa and/or part of the IT band is only considered after at least 6 months of non-surgical treatment.   MEDICATION   · If pain medicine is needed, nonsteroidal anti-inflammatory medicines (aspirin and ibuprofen), or other minor pain relievers  (acetaminophen), are often advised.  · Do not take pain medicine for 7 days before surgery.  · Prescription pain relievers may be given, if your caregiver thinks they are needed. Use only as directed and only as much as you need.  · Corticosteroid injections may be given by your caregiver. These injections should be reserved for the most serious cases, because they may only be given a certain number of times.  HEAT AND COLD  · Cold treatment (icing) should be applied for 10 to 15 minutes every 2 to 3 hours for inflammation and pain, and immediately after activity that aggravates your symptoms. Use ice packs or an ice massage.  · Heat treatment may be used before performing stretching and strengthening activities prescribed by your caregiver, physical therapist, or athletic trainer. Use a heat pack or a warm water soak.  SEEK MEDICAL CARE IF:   · Symptoms get worse or do not improve in 2 to 4 weeks, despite treatment.  · New, unexplained symptoms develop. (Drugs used in treatment may produce side effects.)  EXERCISES   RANGE OF MOTION (ROM) AND STRETCHING EXERCISES - Iliotibial Band Syndrome  These exercises may help you when beginning to rehabilitate your injury. Your symptoms may go away with or without further involvement from your physician, physical therapist or athletic trainer. While completing these exercises, remember:   · Restoring tissue flexibility helps normal motion to return to the joints. This allows healthier, less painful movement and activity.  · An effective stretch should be held for at least 30 seconds.  · A stretch should never be painful. You should only feel a gentle lengthening or release in the stretched tissue.  STRETCH - Quadriceps, Prone   · Lie on your stomach on a firm surface, such as a bed or padded floor.  · Bend your right / left knee and grasp your ankle. If you are unable to reach your ankle or pant leg, use a belt around your foot to lengthen your reach.  · Gently pull your heel  toward your buttocks. Your knee should not slide out to the side. You should feel a stretch in the front of your thigh and knee.  · Hold this position for __________ seconds.  Repeat __________ times. Complete this stretch __________ times per day.   STRETCH  Iliotibial Band  · On the floor or bed, lie on your side, so your right / left leg is on top. Bend your knee and grab your ankle.  · Slowly bring your knee back so that your thigh is in line with your trunk. Keep your heel at your buttocks and gently arch your back, so your head, shoulders and hips line up.  · Slowly lower your leg so that your knee approaches the floor or bed, until you feel a gentle stretch on the outside of your right / left thigh. If you do not feel a stretch and your knee will not fall farther, place the heel of your opposite foot on top of your knee, and pull your thigh down farther.  ·   Hold this stretch for __________ seconds.  Repeat __________ times. Complete this stretch __________ times per day.  STRENGTHENING EXERCISES - Iliotibial Band Syndrome  Improving the flexibility of the IT band will best relieve your discomfort due to IT band syndrome. Strengthening exercises, however, can help improve both muscle endurance and joint mechanics, reducing the factors that can contribute to this condition. Your physician, physical therapist or athletic trainer may provide you with exercises that train specific muscle groups that are especially weak. The following exercises target muscles that are often weak in people who have IT band syndrome.  STRENGTH - Hip Abductors, Straight Leg Raises   Be aware of your form throughout the entire exercise, so that you exercise the correct muscles. Poor form means that you are not strengthening the correct muscles.  · Lie on your side, so that your head, shoulders, knee and hip line up. You may bend your lower knee to help maintain your balance. Your right / left leg should be on top.  · Roll your hips  slightly forward, so that your hips are stacked directly over each other and your right / left knee is facing forward.  · Lift your top leg up 4-6 inches, leading with your heel. Be sure that your foot does not drift forward and that your knee does not roll toward the ceiling.  · Hold this position for __________ seconds. You should feel the muscles in your outer hip lifting (you may not notice this until your leg begins to tire).  · Slowly lower your leg to the starting position. Allow the muscles to fully relax before beginning the next repetition.  Repeat __________ times. Complete this exercise __________ times per day.   STRENGTH - Quad/VMO, Isometric  · Sit in a chair with your right / left knee slightly bent. With your fingertips, feel the VMO muscle (just above the inside of your knee). The VMO is important in controlling the position of your kneecap.  · Keeping your fingertips on this muscle. Without actually moving your leg, attempt to drive your knee down, as if straightening your leg. You should feel your VMO tense. If you have a difficult time, you may wish to try the same exercise on your healthy knee first.  · Tense this muscle as hard as you can, without increasing any knee pain.  · Hold for __________ seconds. Relax the muscles slowly and completely between each repetition.  Repeat __________ times. Complete this exercise __________ times per day.   Document Released: 10/15/2005 Document Revised: 01/07/2012 Document Reviewed: 01/27/2009  [employer]® Patient Information ©2014 [employer].

## 2014-02-12 LAB — COMPLETE METABOLIC PANEL WITH GFR
ALBUMIN: 4.2 g/dL (ref 3.5–5.2)
ALT: 49 U/L — ABNORMAL HIGH (ref 0–35)
AST: 29 U/L (ref 0–37)
Alkaline Phosphatase: 103 U/L (ref 39–117)
BUN: 13 mg/dL (ref 6–23)
CALCIUM: 9.2 mg/dL (ref 8.4–10.5)
CO2: 27 meq/L (ref 19–32)
Chloride: 98 mEq/L (ref 96–112)
Creat: 0.62 mg/dL (ref 0.50–1.10)
GFR, Est Non African American: >89 mL/min
GLUCOSE: 227 mg/dL — AB (ref 70–99)
POTASSIUM: 3.7 meq/L (ref 3.5–5.3)
Sodium: 136 mEq/L (ref 135–145)
TOTAL PROTEIN: 7.2 g/dL (ref 6.0–8.3)
Total Bilirubin: 0.3 mg/dL (ref 0.2–1.2)

## 2014-02-15 ENCOUNTER — Encounter: Payer: BC Managed Care – PPO | Admitting: Physical Therapy

## 2014-02-17 ENCOUNTER — Encounter: Payer: BC Managed Care – PPO | Admitting: Physical Therapy

## 2014-02-22 ENCOUNTER — Ambulatory Visit: Payer: BC Managed Care – PPO | Admitting: Dietician

## 2014-03-18 ENCOUNTER — Ambulatory Visit: Payer: BC Managed Care – PPO | Admitting: Obstetrics

## 2014-04-21 ENCOUNTER — Ambulatory Visit (INDEPENDENT_AMBULATORY_CARE_PROVIDER_SITE_OTHER): Payer: BC Managed Care – PPO

## 2014-04-21 ENCOUNTER — Ambulatory Visit (INDEPENDENT_AMBULATORY_CARE_PROVIDER_SITE_OTHER): Payer: BC Managed Care – PPO | Admitting: Family Medicine

## 2014-04-21 VITALS — BP 126/88 | HR 82 | Temp 97.9°F | Resp 16 | Ht 62.0 in | Wt 267.8 lb

## 2014-04-21 DIAGNOSIS — M79609 Pain in unspecified limb: Secondary | ICD-10-CM

## 2014-04-21 DIAGNOSIS — R1013 Epigastric pain: Secondary | ICD-10-CM

## 2014-04-21 DIAGNOSIS — IMO0001 Reserved for inherently not codable concepts without codable children: Secondary | ICD-10-CM

## 2014-04-21 DIAGNOSIS — M79641 Pain in right hand: Secondary | ICD-10-CM

## 2014-04-21 DIAGNOSIS — K3189 Other diseases of stomach and duodenum: Secondary | ICD-10-CM

## 2014-04-21 DIAGNOSIS — M79642 Pain in left hand: Secondary | ICD-10-CM

## 2014-04-21 DIAGNOSIS — G5603 Carpal tunnel syndrome, bilateral upper limbs: Secondary | ICD-10-CM

## 2014-04-21 DIAGNOSIS — E1165 Type 2 diabetes mellitus with hyperglycemia: Secondary | ICD-10-CM

## 2014-04-21 DIAGNOSIS — G5600 Carpal tunnel syndrome, unspecified upper limb: Secondary | ICD-10-CM

## 2014-04-21 LAB — POCT CBC
GRANULOCYTE PERCENT: 59 % (ref 37–80)
HCT, POC: 45.2 % (ref 37.7–47.9)
HEMOGLOBIN: 14.8 g/dL (ref 12.2–16.2)
Lymph, poc: 3.3 (ref 0.6–3.4)
MCH: 28.6 pg (ref 27–31.2)
MCHC: 32.7 g/dL (ref 31.8–35.4)
MCV: 87.5 fL (ref 80–97)
MID (cbc): 0.6 (ref 0–0.9)
MPV: 8.4 fL (ref 0–99.8)
POC Granulocyte: 5.7 (ref 2–6.9)
POC LYMPH PERCENT: 34.4 %L (ref 10–50)
POC MID %: 6.6 %M (ref 0–12)
Platelet Count, POC: 372 10*3/uL (ref 142–424)
RBC: 5.17 M/uL (ref 4.04–5.48)
RDW, POC: 13.3 %
WBC: 9.6 10*3/uL (ref 4.6–10.2)

## 2014-04-21 LAB — COMPREHENSIVE METABOLIC PANEL
ALBUMIN: 4.3 g/dL (ref 3.5–5.2)
ALK PHOS: 76 U/L (ref 39–117)
ALT: 43 U/L — ABNORMAL HIGH (ref 0–35)
AST: 29 U/L (ref 0–37)
BUN: 10 mg/dL (ref 6–23)
CALCIUM: 9.2 mg/dL (ref 8.4–10.5)
CHLORIDE: 96 meq/L (ref 96–112)
CO2: 28 mEq/L (ref 19–32)
Creat: 0.58 mg/dL (ref 0.50–1.10)
GLUCOSE: 244 mg/dL — AB (ref 70–99)
Potassium: 3.9 mEq/L (ref 3.5–5.3)
SODIUM: 136 meq/L (ref 135–145)
Total Bilirubin: 0.5 mg/dL (ref 0.2–1.2)
Total Protein: 7.1 g/dL (ref 6.0–8.3)

## 2014-04-21 LAB — LIPASE: Lipase: 14 U/L (ref 0–75)

## 2014-04-21 LAB — POCT GLYCOSYLATED HEMOGLOBIN (HGB A1C): Hemoglobin A1C: 9

## 2014-04-21 LAB — POCT URINE PREGNANCY: Preg Test, Ur: NEGATIVE

## 2014-04-21 MED ORDER — GLIMEPIRIDE 2 MG PO TABS: 2.0000 mg | ORAL_TABLET | Freq: Every day | ORAL | Status: DC

## 2014-04-21 MED ORDER — OMEPRAZOLE 40 MG PO CPDR: DELAYED_RELEASE_CAPSULE | ORAL | Status: DC

## 2014-04-21 NOTE — Patient Instructions (Addendum)
Take omeprazole one daily for the stomach  Decrease the metformin to one half tablet 3 times a day with food  Take glimepiride 2 mg one each morning for diabetes  Continue to try to eat less and be as active as your knees will allow you in order to keep losing some more weight.  Retun in 2-3 months for a recheck, sooner if not feeling better   You are being referred to get I nerve conduction test on both hands to determine how bad the carpal tunnel syndrome is. Will probably need a referral to a specialist after that.

## 2014-04-21 NOTE — Progress Notes (Addendum)
Subjective: Patient is here with several issues. Her hands been bothering her more and more she gets if she cannot hold the steering wheel without shaking her hand out because of the numbness. If she leans on her hand it is numb and painful. It's bad in the mornings, but recurrent to the day. She has worn her nighttime splints without relief.  She's been having some chronic abdominal pain. She says she can only on her stomach. It just hurts her across the upper abdomen and down the left side of the abdomen. Her bowels have activity fairly often, which does take the metformin she has loose BMs. She has tried taking the metformin in different fashions, sometimes more, sometimes less. It always causes her diarrhea. She does not check sugars at home.Feels a burning in her stomach.  Objective: Pleasant alert lady, significantly overweight, no acute distress. Positive Tinel sign bilaterally. Right worse than left. Chest clear. Heart regular without murmurs. Abdomen normal bowel sounds, soft without masses. Tender across the epigastrium and in the left side of the abdomen.  Assessment: Bilateral carpal tunnel syndrome Diabetes Medication intolerance Abdominal pain, nonspecific  Plan:  Increase metformin to one half pill 3 times daily with meals. X-ray and CBC lipase, C. met, A1c  Results for orders placed in visit on 04/21/14  POCT CBC      Result Value Ref Range   WBC 9.6  4.6 - 10.2 K/uL   Lymph, poc 3.3  0.6 - 3.4   POC LYMPH PERCENT 34.4  10 - 50 %L   MID (cbc) 0.6  0 - 0.9   POC MID % 6.6  0 - 12 %M   POC Granulocyte 5.7  2 - 6.9   Granulocyte percent 59.0  37 - 80 %G   RBC 5.17  4.04 - 5.48 M/uL   Hemoglobin 14.8  12.2 - 16.2 g/dL   HCT, POC 45.2  37.7 - 47.9 %   MCV 87.5  80 - 97 fL   MCH, POC 28.6  27 - 31.2 pg   MCHC 32.7  31.8 - 35.4 g/dL   RDW, POC 13.3     Platelet Count, POC 372  142 - 424 K/uL   MPV 8.4  0 - 99.8 fL  POCT GLYCOSYLATED HEMOGLOBIN (HGB A1C)      Result  Value Ref Range   Hemoglobin A1C 9.0    POCT URINE PREGNANCY      Result Value Ref Range   Preg Test, Ur Negative     [facility] reading (PRIMARY) by  Dr. Linna Darner Normal abdomen.

## 2014-04-23 ENCOUNTER — Telehealth: Payer: Self-pay

## 2014-04-23 DIAGNOSIS — G5603 Carpal tunnel syndrome, bilateral upper limbs: Secondary | ICD-10-CM

## 2014-04-23 NOTE — Telephone Encounter (Signed)
Dr. Linna Darner, were you going to refer pt for a nerve conduction test for her CTS?

## 2014-04-24 NOTE — Telephone Encounter (Signed)
I apologize. It was ordered. It just wasn't in the referrals tab in chart review.

## 2014-04-24 NOTE — Telephone Encounter (Signed)
Yes.    Please order bilateral nerve conduction tests for Carpal Tunnel symptoms, numbness of hands.     I thought we had done so, must have forgotten to.   Thanks,  [name] Reason

## 2014-04-30 ENCOUNTER — Other Ambulatory Visit: Payer: Self-pay | Admitting: *Deleted

## 2014-04-30 DIAGNOSIS — R200 Anesthesia of skin: Secondary | ICD-10-CM

## 2014-05-05 ENCOUNTER — Ambulatory Visit (INDEPENDENT_AMBULATORY_CARE_PROVIDER_SITE_OTHER): Payer: BC Managed Care – PPO

## 2014-05-05 DIAGNOSIS — M79642 Pain in left hand: Secondary | ICD-10-CM

## 2014-05-05 DIAGNOSIS — G5603 Carpal tunnel syndrome, bilateral upper limbs: Secondary | ICD-10-CM

## 2014-05-05 DIAGNOSIS — M79641 Pain in right hand: Secondary | ICD-10-CM

## 2014-05-05 DIAGNOSIS — G5600 Carpal tunnel syndrome, unspecified upper limb: Secondary | ICD-10-CM

## 2014-05-05 NOTE — Procedures (Signed)
     HISTORY:  [name] is a 38 year old patient with a several month history of pain and numbness involving the hands and arms bilaterally. The patient is being evaluated for possible neuropathy.  NERVE CONDUCTION STUDIES:  Nerve conduction studies were performed on both upper extremities. The distal motor latencies for the median nerves were significantly prolonged bilaterally, with a low motor amplitude on the left, normal on the right. The distal motor latencies and motor amplitudes for the ulnar nerves were normal bilaterally. The F wave latencies for the median nerves were prolonged bilaterally, normal for the ulnar nerves bilaterally. The sensory latencies for the median nerves were unobtainable bilaterally, normal for the ulnar nerves bilaterally.  EMG STUDIES:  EMG evaluation was not performed.  IMPRESSION:  Nerve conduction studies done on both upper extremities shows evidence of bilateral carpal tunnel syndrome of moderate severity, a bit more prominent on the left than the right. No other significant abnormalities were seen.  Jill Alexanders MD 05/05/2014 3:04 PM  [facility] [address] [place], [place] [address]  Phone [phone] Fax [phone]

## 2014-05-12 ENCOUNTER — Other Ambulatory Visit: Payer: Self-pay | Admitting: Radiology

## 2014-05-12 DIAGNOSIS — G5600 Carpal tunnel syndrome, unspecified upper limb: Secondary | ICD-10-CM

## 2014-05-15 ENCOUNTER — Ambulatory Visit (INDEPENDENT_AMBULATORY_CARE_PROVIDER_SITE_OTHER): Payer: BC Managed Care – PPO | Admitting: Family Medicine

## 2014-05-15 ENCOUNTER — Ambulatory Visit (INDEPENDENT_AMBULATORY_CARE_PROVIDER_SITE_OTHER): Payer: BC Managed Care – PPO

## 2014-05-15 DIAGNOSIS — M25562 Pain in left knee: Secondary | ICD-10-CM

## 2014-05-15 DIAGNOSIS — R51 Headache: Secondary | ICD-10-CM

## 2014-05-15 DIAGNOSIS — M25569 Pain in unspecified knee: Secondary | ICD-10-CM

## 2014-05-15 DIAGNOSIS — R079 Chest pain, unspecified: Secondary | ICD-10-CM

## 2014-05-15 MED ORDER — TOPIRAMATE 50 MG PO TABS: 50.0000 mg | ORAL_TABLET | Freq: Two times a day (BID) | ORAL | Status: DC

## 2014-05-15 MED ORDER — DICLOFENAC SODIUM 75 MG PO TBEC: 75.0000 mg | DELAYED_RELEASE_TABLET | Freq: Two times a day (BID) | ORAL | Status: DC
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
## 2014-05-21 ENCOUNTER — Ambulatory Visit (INDEPENDENT_AMBULATORY_CARE_PROVIDER_SITE_OTHER): Payer: BC Managed Care – PPO | Admitting: Family Medicine

## 2014-05-21 ENCOUNTER — Ambulatory Visit (INDEPENDENT_AMBULATORY_CARE_PROVIDER_SITE_OTHER): Payer: BC Managed Care – PPO

## 2014-05-21 VITALS — BP 112/72 | HR 68 | Temp 98.2°F | Resp 16 | Ht 62.0 in | Wt 260.0 lb

## 2014-05-21 DIAGNOSIS — E1165 Type 2 diabetes mellitus with hyperglycemia: Secondary | ICD-10-CM

## 2014-05-21 DIAGNOSIS — R1013 Epigastric pain: Secondary | ICD-10-CM

## 2014-05-21 DIAGNOSIS — R197 Diarrhea, unspecified: Secondary | ICD-10-CM

## 2014-05-21 DIAGNOSIS — IMO0001 Reserved for inherently not codable concepts without codable children: Secondary | ICD-10-CM

## 2014-05-21 DIAGNOSIS — R319 Hematuria, unspecified: Secondary | ICD-10-CM

## 2014-05-21 LAB — POCT CBC
Granulocyte percent: 50.1 %G (ref 37–80)
HCT, POC: 47.6 % (ref 37.7–47.9)
Hemoglobin: 15.7 g/dL (ref 12.2–16.2)
Lymph, poc: 3 (ref 0.6–3.4)
MCH, POC: 28.4 pg (ref 27–31.2)
MCHC: 33 g/dL (ref 31.8–35.4)
MCV: 85.9 fL (ref 80–97)
MID (CBC): 0.8 (ref 0–0.9)
MPV: 7.7 fL (ref 0–99.8)
POC Granulocyte: 3.8 (ref 2–6.9)
POC LYMPH %: 39.6 % (ref 10–50)
POC MID %: 10.3 %M (ref 0–12)
Platelet Count, POC: 361 10*3/uL (ref 142–424)
RBC: 5.54 M/uL — AB (ref 4.04–5.48)
RDW, POC: 13 %
WBC: 7.6 10*3/uL (ref 4.6–10.2)

## 2014-05-21 LAB — POCT URINALYSIS DIPSTICK
BILIRUBIN UA: NEGATIVE
Glucose, UA: 100
KETONES UA: NEGATIVE
LEUKOCYTES UA: NEGATIVE
Nitrite, UA: NEGATIVE
Spec Grav, UA: 1.02
Urobilinogen, UA: 0.2
pH, UA: 5

## 2014-05-21 LAB — POCT UA - MICROSCOPIC ONLY
CRYSTALS, UR, HPF, POC: NEGATIVE
Casts, Ur, LPF, POC: NEGATIVE
Mucus, UA: NEGATIVE
Yeast, UA: NEGATIVE

## 2014-05-21 MED ORDER — DICYCLOMINE HCL 10 MG PO CAPS
10.0000 mg | ORAL_CAPSULE | Freq: Three times a day (TID) | ORAL | Status: DC
Start: 2014-05-21 — End: 2014-06-26
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
## 2014-05-21 NOTE — Patient Instructions (Addendum)
Drink plenty of water.  Take the dicyclomine 1 pill 4 times daily before meals and at bedtime as needed for diarrhea and abdominal pain  If you are not improving over the next couple of days or if you have worse at anytime please return.  If you had more severe abdominal pain or fevers you should also be reexamined.  Continue watching your diabetes.

## 2014-06-26 ENCOUNTER — Ambulatory Visit (INDEPENDENT_AMBULATORY_CARE_PROVIDER_SITE_OTHER): Payer: BC Managed Care – PPO | Admitting: Physician Assistant

## 2014-06-26 VITALS — BP 126/80 | HR 90 | Temp 98.4°F | Resp 18 | Ht 62.0 in | Wt 266.0 lb

## 2014-06-26 DIAGNOSIS — N920 Excessive and frequent menstruation with regular cycle: Secondary | ICD-10-CM

## 2014-06-26 DIAGNOSIS — N912 Amenorrhea, unspecified: Secondary | ICD-10-CM

## 2014-06-26 DIAGNOSIS — N921 Excessive and frequent menstruation with irregular cycle: Secondary | ICD-10-CM

## 2014-06-26 LAB — POCT CBC
Granulocyte percent: 54.9 %G (ref 37–80)
HCT, POC: 42.5 % (ref 37.7–47.9)
Hemoglobin: 13.8 g/dL (ref 12.2–16.2)
LYMPH, POC: 3.5 — AB (ref 0.6–3.4)
MCH, POC: 27.8 pg (ref 27–31.2)
MCHC: 32.6 g/dL (ref 31.8–35.4)
MCV: 85.3 fL (ref 80–97)
MID (CBC): 0.4 (ref 0–0.9)
MPV: 7.4 fL (ref 0–99.8)
PLATELET COUNT, POC: 336 10*3/uL (ref 142–424)
POC Granulocyte: 4.7 (ref 2–6.9)
POC LYMPH PERCENT: 40.5 %L (ref 10–50)
POC MID %: 4.6 % (ref 0–12)
RBC: 4.98 M/uL (ref 4.04–5.48)
RDW, POC: 13.1 %
WBC: 8.6 10*3/uL (ref 4.6–10.2)

## 2014-06-26 LAB — HCG, QUANTITATIVE, PREGNANCY: hCG, Beta Chain, Quant, S: <2 m[IU]/mL

## 2014-06-26 MED ORDER — NORETHINDRONE-MESTRANOL 1-50 MG-MCG PO TABS: ORAL_TABLET | ORAL | Status: DC

## 2014-06-26 MED ORDER — ONDANSETRON HCL 4 MG PO TABS: ORAL_TABLET | ORAL | Status: DC

## 2014-06-26 NOTE — Patient Instructions (Signed)
Today please start birth control pills - today and tomorrow you will take 4 pills at one, then the next 2 days take 3 pills and then the next 2 days take 2 pills then 1 pill daily until the pack is gone.  They may cause you to be nauseated I have sent a nausea medication called Zofran to the pharmacy that you should take 30 mins prior to taking the pills

## 2014-06-27 NOTE — Progress Notes (Signed)
Subjective:    Patient ID: [name], female    DOB: 1976-10-13, 38 y.o.   MRN: [id number]  HPI  Pt presents to clinic with vaginal bleeding that is more than her normal menses.  She typically has regular menses and her last menses was early July and she missed it in August and she started bleeding 2 days ago.  It is really heavy - she is changing her pad and tampon every 1-1.5 hours and soaking through both of those.  She is cramping some but not that much as she is bleeding.  She feels terrible and is scared.  She has never had this before.  She has been trying to get pregnant for the last several years but not been successful.  She has been diagnosed with PCOS.  She is having some dizziness but she was having this prior to her bleeding starting.  She has been with her current partner for years.   Review of Systems     Objective:   Physical Exam  Vitals reviewed. Constitutional: She is oriented to person, place, and time. She appears well-developed and well-nourished.  HENT:  Head: Normocephalic and atraumatic.  Right Ear: External ear normal.  Left Ear: External ear normal.  Eyes: Conjunctivae are normal.  Neck: Normal range of motion.  Cardiovascular: Normal rate, regular rhythm and normal heart sounds.   No murmur heard. Pulmonary/Chest: Effort normal and breath sounds normal. She has no wheezes.  Genitourinary: Pelvic exam was performed with patient supine. There is bleeding (copious amounts of blood removed from the vault and then she has bleeding from her os - but no lesions or lacerations seen - the bleeding from the os is slow consistent flow.) around the vagina.  Neurological: She is alert and oriented to person, place, and time.  Skin: Skin is warm and dry.  Psychiatric: She has a normal mood and affect. Her behavior is normal. Judgment and thought content normal.   Results for orders placed in visit on 06/26/14  HCG, QUANTITATIVE, PREGNANCY      Result  Value Ref Range   hCG, Beta Chain, Quant, S <2.0    POCT CBC      Result Value Ref Range   WBC 8.6  4.6 - 10.2 K/uL   Lymph, poc 3.5 (*) 0.6 - 3.4   POC LYMPH PERCENT 40.5  10 - 50 %L   MID (cbc) 0.4  0 - 0.9   POC MID % 4.6  0 - 12 %M   POC Granulocyte 4.7  2 - 6.9   Granulocyte percent 54.9  37 - 80 %G   RBC 4.98  4.04 - 5.48 M/uL   Hemoglobin 13.8  12.2 - 16.2 g/dL   HCT, POC 42.5  37.7 - 47.9 %   MCV 85.3  80 - 97 fL   MCH, POC 27.8  27 - 31.2 pg   MCHC 32.6  31.8 - 35.4 g/dL   RDW, POC 13.1     Platelet Count, POC 336  142 - 424 K/uL   MPV 7.4  0 - 99.8 fL       Assessment & Plan:  Menorrhagia with irregular cycle - Plan: POCT CBC, Norethindrone-Mestranol (NECON) 1-50 MG-MCG tablet, ondansetron (ZOFRAN) 4 MG tablet, hCG, quantitative, pregnancy, CANCELED: GC/Chlamydia Probe Amp - pt was unable to collect a specimen and her bleeding prevented it from being collected during the pelvic exam - we will stop her bleeding and she will RTC if it  does not slow down over the next 48h and it if occurs again she will need a referral to a GYN for biopsy.  This is most likely related to no menses last month.  Amenorrhea for 8 weeks until 2 days ago - will check a pregnancy test to make sure this is not a miscarriage de to heavy bleeding.  Windell Hummingbird PA-C  [facility] 06/27/2014 4:04 PM

## 2014-09-02 ENCOUNTER — Ambulatory Visit (INDEPENDENT_AMBULATORY_CARE_PROVIDER_SITE_OTHER): Payer: BC Managed Care – PPO | Admitting: Emergency Medicine

## 2014-09-02 VITALS — BP 150/92 | HR 90 | Temp 98.7°F | Resp 18 | Ht 62.0 in | Wt 268.0 lb

## 2014-09-02 DIAGNOSIS — Z8619 Personal history of other infectious and parasitic diseases: Secondary | ICD-10-CM

## 2014-09-02 DIAGNOSIS — R12 Heartburn: Secondary | ICD-10-CM

## 2014-09-02 HISTORY — DX: Personal history of other infectious and parasitic diseases: Z86.19

## 2014-09-02 MED ORDER — GLIMEPIRIDE 4 MG PO TABS: 2.0000 mg | ORAL_TABLET | Freq: Every day | ORAL | Status: DC

## 2014-09-02 MED ORDER — ESOMEPRAZOLE MAGNESIUM 40 MG PO CPDR: 40.0000 mg | DELAYED_RELEASE_CAPSULE | Freq: Every day | ORAL | Status: DC

## 2014-09-02 NOTE — Progress Notes (Signed)
[facility] [address] [place] [address] [phone]  Date:  09/02/2014   Name:  [name]   DOB:  10/06/1976   MRN:  [id number]  PCP:  Ruben Reason, MD    Chief Complaint: Pressure in head; Abdominal Pain; Emesis; and Nocturia   History of Present Illness:  [name] is a 38 y.o. very pleasant female patient who presents with the following:  Ill past few weeks with waterbrash and cough.  Has epigastric pain no nausea or vomiting.  No stool change. working on losing weight due diabetes but not working.   Not exercising.   Frequent heart burn. No blood in stool  No improvement with over the counter medications or other home remedies. Denies other complaint or health concern today.   Patient Active Problem List   Diagnosis Date Noted  . Irregular menstrual cycle 11/12/2013  . DIABETES MELLITUS, TYPE II, UNCONTROLLED 06/20/2009  . POLYCYSTIC OVARIAN DISEASE 06/20/2009  . CARPAL TUNNEL SYNDROME 06/20/2009    Past Medical History  Diagnosis Date  . Hypertension   . Diabetes mellitus     Past Surgical History  Procedure Laterality Date  . Cesarean section      History  Substance Use Topics  . Smoking status: Never Smoker   . Smokeless tobacco: Never Used  . Alcohol Use: Yes     Comment: socially    Family History  Problem Relation Age of Onset  . Heart disease Mother   . Diabetes Mother   . Hypertension Mother   . Cancer Father     No Known Allergies  Medication list has been reviewed and updated.  Current Outpatient Prescriptions on File Prior to Visit  Medication Sig Dispense Refill  . metFORMIN (GLUCOPHAGE) 500 MG tablet Take 500 mg by mouth 2 (two) times daily with a meal.    . Norethindrone-Mestranol (NECON) 1-50 MG-MCG tablet Taper of 4-4-3-3-2-2-1-1-1 1 Package 0  . ondansetron (ZOFRAN) 4 MG tablet 1-2 pills po 30 mins prior to birth control dose 10 tablet 0   No current  facility-administered medications on file prior to visit.    Review of Systems:  As per HPI, otherwise negative.    Physical Examination: Filed Vitals:   09/02/14 1915  BP: 150/92  Pulse: 90  Temp: 98.7 F (37.1 C)  Resp: 18   Filed Vitals:   09/02/14 1915  Height: 5\' 2"  (1.575 m)  Weight: 268 lb (121.564 kg)   Body mass index is 49.01 kg/(m^2). Ideal Body Weight: Weight in (lb) to have BMI = 25: 136.4  GEN: morbid obesity, NAD, Non-toxic, A & O x 3 HEENT: Atraumatic, Normocephalic. Neck supple. No masses, No LAD. Ears and Nose: No external deformity. CV: RRR, No M/G/R. No JVD. No thrill. No extra heart sounds. PULM: CTA B, no wheezes, crackles, rhonchi. No retractions. No resp. distress. No accessory muscle use. ABD: S, NT, ND, +BS. No rebound. No HSM. EXTR: No c/c/e NEURO Normal gait.  PSYCH: Normally interactive. Conversant. Not depressed or anxious appearing.  Calm demeanor.    Assessment and Plan: Morbid obesity NIDDM poorly controlled GERD  Signed,  Ellison Carwin, MD

## 2014-09-02 NOTE — Patient Instructions (Addendum)
Tomase 8 mg glimepiride daily in the morning   Reflujo gastroesofgico - Adultos  (Gastroesophageal Reflux Disease, Adult)  El reflujo gastroesofgico ocurre cuando el cido del estmago pasa al esfago. Cuando el cido entra en contacto con el esfago, el cido provoca dolor (inflamacin) en el esfago. Con el tiempo, pueden formarse pequeos agujeros (lceras) en el revestimiento del esfago. CAUSAS   Exceso de Engineer, site. Esto aplica presin [employer], lo que hace que el cido del estmago suba hacia el esfago.  El hbito de fumar Aumenta la produccin de cido en el [place].  El consumo de alcohol. Provoca disminucin de la presin en el esfnter esofgico inferior (vlvula o anillo de msculo entre el esfago y Product manager), permitiendo que el cido del estmago suba hacia el esfago.  Cenas a ltima hora del da y estmago lleno. Aumenta la presin y la produccin de cido en el estmago.  Malformacin en el esfnter esofgico inferior. A menudo no se halla causa.  SNTOMAS   Ardor y Aeronautical engineer parte inferior del pecho detrs del esternn y en la zona media del [place]. Puede ocurrir [employer] por semana o ms a menudo.  Dificultad para tragar.  Dolor de Investment banker, operational.  Tos seca.  Sntomas similares al asma que incluyen sensacin de opresin en el pecho, falta de aire y sibilancias. DIAGNSTICO  El mdico diagnosticar el problema basndose en los sntomas. En algunos casos, se indican radiografas y otras pruebas para verificar si hay complicaciones o para comprobar el estado del estmago y Education administrator.  TRATAMIENTO  El mdico le indicar medicamentos de venta libre o recetados para ayudar a disminuir la produccin de cido. Consulte con su mdico antes de Art gallery manager o agregar cualquier medicamento nuevo.  INSTRUCCIONES PARA EL CUIDADO EN EL HOGAR   Modifique los factores que pueda cambiar. Consulte con su mdico para solicitar orientacin relacionada con la prdida de  peso, dejar de fumar y el consumo de alcohol.  Evite las comidas y bebidas que empeoran los [place], [place]:  [place] con cafena o alcohlicas.  Chocolate.  Sabores a English as a second language teacher.  Ajo y cebolla.  Comidas muy condimentadas.  Ctricos como naranjas, limones o limas.  Alimentos que contengan tomate, como salsas, [place] y pizza.  Alimentos fritos y Radio broadcast assistant.  Evite acostarse durante 3 horas antes de irse a dormir o antes de tomar una siesta.  Haga comidas pequeas durante Psychiatrist de 3 comidas abundantes.  Use ropas sueltas. No use nada apretado alrededor de la cintura que cause presin en el estmago.  Levante (eleve) la cabecera de la cama 6 a 8 pulgadas (15 a 20 cm) con bloques de madera. Usar almohadas extra no ayuda.  Solo tome medicamentos que se pueden comprar sin receta o recetados para el dolor, Tree surgeon o fiebre, como le indica el mdico.  No tome aspirina, ibuprofeno ni antiinflamatorios no esteroides. [place] [employer] SI:   [employer], el cuello, la [place], los dientes o la espalda.  El dolor aumenta o cambia la intensidad o la durancin.  Tiene nuseas, vmitos o sudoracin(diaforesis).  Siente falta de aire o dolor en el pecho, o se desmaya.  Vomita y el vmito tiene [place], es de color [place], [place], negro o es similar a la borra del caf o tiene [place].  Las heces son rojas, sanguinolentas o negras. Estos sntomas pueden ser signos de otros problemas, como enfermedades cardacas, hemorragias gstrias o sangrado esofgico.  ASEGRESE DE QUE:  Comprende estas instrucciones.  Controlar su enfermedad.  Solicitar ayuda de inmediato si no mejora o si empeora. Document Released: 07/25/2005 Document Revised: 01/07/2012 [facility] Patient Information 2015 [place]. This information is not intended to replace advice given to you by your health care provider. Make sure you discuss any questions you have with your  health care provider.

## 2014-09-06 ENCOUNTER — Other Ambulatory Visit: Payer: Self-pay | Admitting: Emergency Medicine

## 2014-09-06 LAB — H. PYLORI ANTIBODY, IGG: H Pylori IgG: >8 {ISR} — ABNORMAL HIGH

## 2014-09-06 MED ORDER — AMOXICILL-CLARITHRO-LANSOPRAZ PO MISC: ORAL | Status: DC

## 2014-09-14 ENCOUNTER — Ambulatory Visit (INDEPENDENT_AMBULATORY_CARE_PROVIDER_SITE_OTHER): Payer: BC Managed Care – PPO | Admitting: Family Medicine

## 2014-09-14 VITALS — BP 126/82 | HR 102 | Temp 98.2°F | Resp 18 | Ht 62.0 in | Wt 271.0 lb

## 2014-09-14 DIAGNOSIS — Z3169 Encounter for other general counseling and advice on procreation: Secondary | ICD-10-CM

## 2014-09-14 DIAGNOSIS — R635 Abnormal weight gain: Secondary | ICD-10-CM

## 2014-09-14 DIAGNOSIS — B9681 Helicobacter pylori [H. pylori] as the cause of diseases classified elsewhere: Secondary | ICD-10-CM

## 2014-09-14 DIAGNOSIS — A048 Other specified bacterial intestinal infections: Secondary | ICD-10-CM

## 2014-09-14 DIAGNOSIS — Z3200 Encounter for pregnancy test, result unknown: Secondary | ICD-10-CM

## 2014-09-14 DIAGNOSIS — IMO0002 Reserved for concepts with insufficient information to code with codable children: Secondary | ICD-10-CM

## 2014-09-14 DIAGNOSIS — R740 Nonspecific elevation of levels of transaminase and lactic acid dehydrogenase [LDH]: Secondary | ICD-10-CM

## 2014-09-14 DIAGNOSIS — E1165 Type 2 diabetes mellitus with hyperglycemia: Secondary | ICD-10-CM

## 2014-09-14 DIAGNOSIS — Z79899 Other long term (current) drug therapy: Secondary | ICD-10-CM

## 2014-09-14 DIAGNOSIS — R7401 Elevation of levels of liver transaminase levels: Secondary | ICD-10-CM

## 2014-09-14 LAB — COMPREHENSIVE METABOLIC PANEL
ALBUMIN: 3.9 g/dL (ref 3.5–5.2)
ALT: 51 U/L — ABNORMAL HIGH (ref 0–35)
AST: 32 U/L (ref 0–37)
Alkaline Phosphatase: 79 U/L (ref 39–117)
BILIRUBIN TOTAL: 0.4 mg/dL (ref 0.2–1.2)
BUN: 9 mg/dL (ref 6–23)
CALCIUM: 9 mg/dL (ref 8.4–10.5)
CHLORIDE: 100 meq/L (ref 96–112)
CO2: 28 meq/L (ref 19–32)
Creat: 0.57 mg/dL (ref 0.50–1.10)
GLUCOSE: 150 mg/dL — AB (ref 70–99)
Potassium: 3.9 mEq/L (ref 3.5–5.3)
SODIUM: 134 meq/L — AB (ref 135–145)
TOTAL PROTEIN: 7.1 g/dL (ref 6.0–8.3)

## 2014-09-14 LAB — POCT URINE PREGNANCY: Preg Test, Ur: NEGATIVE

## 2014-09-14 LAB — POCT GLYCOSYLATED HEMOGLOBIN (HGB A1C): Hemoglobin A1C: 7.4

## 2014-09-14 LAB — GLUCOSE, POCT (MANUAL RESULT ENTRY): POC Glucose: 145 mg/dl — AB (ref 70–99)

## 2014-09-14 MED ORDER — BLOOD GLUCOSE MONITOR KIT: PACK | Status: DC

## 2014-09-14 NOTE — Patient Instructions (Signed)
Blood Glucose Monitoring °Monitoring your blood glucose (also know as blood sugar) helps you to manage your diabetes. It also helps you and your health care provider monitor your diabetes and determine how well your treatment plan is working. °WHY SHOULD YOU MONITOR YOUR BLOOD GLUCOSE? °· It can help you understand how food, exercise, and medicine affect your blood glucose. °· It allows you to know what your blood glucose is at any given moment. You can quickly tell if you are having low blood glucose (hypoglycemia) or high blood glucose (hyperglycemia). °· It can help you and your health care provider know how to adjust your medicines. °· It can help you understand how to manage an illness or adjust medicine for exercise. °WHEN SHOULD YOU TEST? °Your health care provider will help you decide how often you should check your blood glucose. This may depend on the type of diabetes you have, your diabetes control, or the types of medicines you are taking. Be sure to write down all of your blood glucose readings so that this information can be reviewed with your health care provider. See below for examples of testing times that your health care provider may suggest. °Type 1 Diabetes °· Test 4 times a day if you are in good control, using an insulin pump, or perform multiple daily injections. °· If your diabetes is not well controlled or if you are sick, you may need to monitor more often. °· It is a good idea to also monitor: °· Before and after exercise. °· Between meals and 2 hours after a meal. °· Occasionally between 2:00 a.m. and 3:00 a.m. °Type 2 Diabetes °· It can vary with each person, but generally, if you are on insulin, test 4 times a day. °· If you take medicines by mouth (orally), test 2 times a day. °· If you are on a controlled diet, test once a day. °· If your diabetes is not well controlled or if you are sick, you may need to monitor more often. °HOW TO MONITOR YOUR BLOOD GLUCOSE °Supplies  Needed °· Blood glucose meter. °· Test strips for your meter. Each meter has its own strips. You must use the strips that go with your own meter. °· A pricking needle (lancet). °· A device that holds the lancet (lancing device). °· A journal or log book to write down your results. °Procedure °· Wash your hands with soap and water. Alcohol is not preferred. °· Prick the side of your finger (not the tip) with the lancet. °· Gently milk the finger until a small drop of blood appears. °· Follow the instructions that come with your meter for inserting the test strip, applying blood to the strip, and using your blood glucose meter. °Other Areas to Get Blood for Testing °Some meters allow you to use other areas of your body (other than your finger) to test your blood. These areas are called alternative sites. The most common alternative sites are: °· The forearm. °· The thigh. °· The back area of the lower leg. °· The palm of the hand. °The blood flow in these areas is slower. Therefore, the blood glucose values you get may be delayed, and the numbers are different from what you would get from your fingers. Do not use alternative sites if you think you are having hypoglycemia. Your reading will not be accurate. Always use a finger if you are having hypoglycemia. Also, if you cannot feel your lows (hypoglycemia unawareness), always use your fingers for your   blood glucose checks. °ADDITIONAL TIPS FOR GLUCOSE MONITORING °· Do not reuse lancets. °· Always carry your supplies with you. °· All blood glucose meters have a 24-hour "hotline" number to call if you have questions or need help. °· Adjust (calibrate) your blood glucose meter with a control solution after finishing a few boxes of strips. °BLOOD GLUCOSE RECORD KEEPING °It is a good idea to keep a daily record or log of your blood glucose readings. Most glucose meters, if not all, keep your glucose records stored in the meter. Some meters come with the ability to download  your records to your home computer. Keeping a record of your blood glucose readings is especially helpful if you are wanting to look for patterns. Make notes to go along with the blood glucose readings because you might forget what happened at that exact time. Keeping good records helps you and your health care provider to work together to achieve good diabetes management.  °Document Released: 10/18/2003 Document Revised: 03/01/2014 Document Reviewed: 03/09/2013 °Exit[employer]® Patient Information ©2015 [employer]. This information is not intended to replace advice given to you by your health care provider. Make sure you discuss any questions you have with your health care provider. ° °Diabetes Mellitus and Food °It is important for you to manage your blood sugar (glucose) level. Your blood glucose level can be greatly affected by what you eat. Eating healthier foods in the appropriate amounts throughout the day at about the same time each day will help you control your blood glucose level. It can also help slow or prevent worsening of your diabetes mellitus. Healthy eating may even help you improve the level of your blood pressure and reach or maintain a healthy weight.  °HOW CAN FOOD AFFECT ME? °Carbohydrates °Carbohydrates affect your blood glucose level more than any other type of food. Your dietitian will help you determine how many carbohydrates to eat at each meal and teach you how to count carbohydrates. Counting carbohydrates is important to keep your blood glucose at a healthy level, especially if you are using insulin or taking certain medicines for diabetes mellitus. °Alcohol °Alcohol can cause sudden decreases in blood glucose (hypoglycemia), especially if you use insulin or take certain medicines for diabetes mellitus. Hypoglycemia can be a life-threatening condition. Symptoms of hypoglycemia (sleepiness, dizziness, and disorientation) are similar to symptoms of having too much alcohol.  °If your health  care provider has given you approval to drink alcohol, do so in moderation and use the following guidelines: °· Women should not have more than one drink per day, and men should not have more than two drinks per day. One drink is equal to: °¨ 12 oz of beer. °¨ 5 oz of wine. °¨ 1½ oz of hard liquor. °· Do not drink on an empty stomach. °· Keep yourself hydrated. Have water, diet soda, or unsweetened iced tea. °· Regular soda, juice, and other mixers might contain a lot of carbohydrates and should be counted. °WHAT FOODS ARE NOT RECOMMENDED? °As you make food choices, it is important to remember that all foods are not the same. Some foods have fewer nutrients per serving than other foods, even though they might have the same number of calories or carbohydrates. It is difficult to get your body what it needs when you eat foods with fewer nutrients. Examples of foods that you should avoid that are high in calories and carbohydrates but low in nutrients include: °· Trans fats (most processed foods list trans fats on the   Nutrition Facts label). °· Regular soda. °· Juice. °· Candy. °· Sweets, such as cake, pie, doughnuts, and cookies. °· Fried foods. °WHAT FOODS CAN I EAT? °Have nutrient-rich foods, which will nourish your body and keep you healthy. The food you should eat also will depend on several factors, including: °· The calories you need. °· The medicines you take. °· Your weight. °· Your blood glucose level. °· Your blood pressure level. °· Your cholesterol level. °You also should eat a variety of foods, including: °· Protein, such as meat, poultry, fish, tofu, nuts, and seeds (lean animal proteins are best). °· Fruits. °· Vegetables. °· Dairy products, such as milk, cheese, and yogurt (low fat is best). °· Breads, grains, pasta, cereal, rice, and beans. °· Fats such as olive oil, trans fat-free margarine, canola oil, avocado, and olives. °DOES EVERYONE WITH DIABETES MELLITUS HAVE THE SAME MEAL PLAN? °Because every  person with diabetes mellitus is different, there is not one meal plan that works for everyone. It is very important that you meet with a dietitian who will help you create a meal plan that is just right for you. °Document Released: 07/12/2005 Document Revised: 10/20/2013 Document Reviewed: 09/11/2013 °ExitCare® Patient Information ©2015 [employer]. This information is not intended to replace advice given to you by your health care provider. Make sure you discuss any questions you have with your health care provider. ° °

## 2014-09-14 NOTE — Progress Notes (Signed)
Subjective:    Patient ID: [name], female    DOB: 14-Jan-1976, 38 y.o.   MRN: [id number] This chart was scribed for Kimberly Cheadle, MD by [name], Medical Scribe. This patient was seen in Room 9 and the patient's care was started a 11:31 AM.  Chief Complaint  Patient presents with  . Follow-up    was given prevpac states medication 500.00  . pregnancy testing    trying to get pregnant     HPI  Past Medical History  Diagnosis Date  . Hypertension   . Diabetes mellitus    No Known Allergies Current Outpatient Prescriptions on File Prior to Visit  Medication Sig Dispense Refill  . glimepiride (AMARYL) 4 MG tablet Take 0.5 tablets (2 mg total) by mouth daily before breakfast. 60 tablet 5  . metFORMIN (GLUCOPHAGE) 500 MG tablet Take 500 mg by mouth 2 (two) times daily with a meal.    . amoxicillin-clarithromycin-lansoprazole (PREVPAC) combo pack Follow package directions. Dispense as generic components 1 kit 0  . esomeprazole (NEXIUM) 40 MG capsule Take 1 capsule (40 mg total) by mouth daily. 30 capsule 3  . Norethindrone-Mestranol (NECON) 1-50 MG-MCG tablet Taper of 4-4-3-3-2-2-1-1-1 1 Package 0  . ondansetron (ZOFRAN) 4 MG tablet 1-2 pills po 30 mins prior to birth control dose 10 tablet 0   No current facility-administered medications on file prior to visit.    HPI Comments: [name] is a 38 y.o. female with a hx of DM and suspected PCOS who presents to [name] for a follow up appointment due to bacterial infection in stomach from helicobacter pylori. Pt states she picked up the medication yesterday and will begin taking medication tomorrow.   Pt states she has been gaining weight recently, after losing 65 pounds she has gained back 25 lbs. She is concerned about her weight gain, especially due to decreased food intake. Pt denies diarrhea or nausea. Pt does not check her blood sugar at home, and does not have a machine to check it. She went through  diabetic education prev and is trying very hard to maintain a diabetic diet so very frustrated.  Pt was instructed to take amaryl four tabs per day (=33m) , but is only taking 4 mg. Pt also doubled her metformin dose to 10066mbid. Pt's last hemoglobin  A1C was in June and was at 9. Pt has had some mild transaminitis with ALT in the 40s. No recent lipid panel. Pt's micro albumen has been elevated, but ACEI/ARB is contra-indicated since pt is trying to conceive.  Pt has seen a gynocologist Dr HaJodi Mourningn February with poss PCOS due to insulin resistance and irregular menstrual cycles. Pt had a transvaginal ultrasound in 2010 that showed normal ovaries without significant cysts. Ultrasound showed uterine fibroids.     Review of Systems  Constitutional: Positive for appetite change, fatigue and unexpected weight change. Negative for activity change.  Cardiovascular: Negative for chest pain.  Gastrointestinal: Positive for diarrhea. Negative for vomiting, abdominal pain and constipation.       Bloating, GERD.  Genitourinary: Positive for menstrual problem and pelvic pain.  Musculoskeletal: Positive for back pain and arthralgias.  Neurological: Negative for numbness.  Psychiatric/Behavioral: The patient is not nervous/anxious.        Objective:  BP 126/82 mmHg  Pulse 102  Temp(Src) 98.2 F (36.8 C) (Oral)  Resp 18  Ht _0  (1.575 m)  Wt 271 lb (122.925 kg)  BMI 49.55 kg/m2  SpO2  100%  LMP 08/19/2014  Physical Exam  Constitutional: She is oriented to person, place, and time. She appears well-developed and well-nourished.  HENT:  Head: Normocephalic and atraumatic.  Eyes: Pupils are equal, round, and reactive to light.  Neck: Neck supple.  Cardiovascular: Normal rate and regular rhythm.   Pulmonary/Chest: Effort normal and breath sounds normal. No respiratory distress.  Neurological: She is alert and oriented to person, place, and time.  Skin: Skin is warm and dry.  Psychiatric: She  has a normal mood and affect. Her behavior is normal.  Nursing note and vitals reviewed.      Assessment & Plan:   Encounter for pregnancy test - Plan: POCT urine pregnancy - pt planning to see fertility dr. Has 2 prior pregnancies w/o trouble attempting to conceive 20 yrs ago and has been trying ever since then w/o success. Reviewed that now pt's age and medical co-morbidities would make pregnancy unlikely and high risk.  Type II diabetes mellitus, uncontrolled - Plan: POCT glucose (manual entry), POCT glycosylated hemoglobin (Hb A1C), Microalbumin/Creatinine Ratio, Urine, Ambulatory referral to Endocrinology, Ambulatory referral to diabetic education, Blood Glucose Monitoring Suppl (BLOOD GLUCOSE METER KIT AND SUPPLIES) KIT, Ambulatory referral to Ophthalmology - start checking cbgs 2 hrs after largest meal. Pt very sure she is following a diabetic diet perfectly and doesn't understand why she is still gaining weight so rec dietician eval. Pt would like to change off of amaryl to DM med that could aid in weight loss but as not using birth control and still attempting to conceive will refer to endocrine for DM trx.  Encounter for preconception consultation - Plan: Ambulatory referral to Endocrinology, Ambulatory referral to diabetic education  Weight gain - Plan: TSH, Ambulatory referral to General Surgery, Ambulatory referral to Endocrinology, Ambulatory referral to diabetic education - pt interested in bariatric surg  Polypharmacy - Plan: Comprehensive metabolic panel  Transaminitis - Plan: Comprehensive metabolic panel - mild -- likely secondary to fatty liver - needs flp and if persists, cons abd [place]  Severe obesity (BMI >= 40) - Plan: Ambulatory referral to General Surgery  Helicobacter pylori (H. pylori) infection - start prevpac  Meds ordered this encounter  Medications  . Blood Glucose Monitoring Suppl (BLOOD GLUCOSE METER KIT AND SUPPLIES) KIT    Sig: Dispense based on patient and  insurance preference. Use up to four times daily as directed. (FOR ICD-9 250.00, 250.01).    Dispense:  1 each    Refill:  0    Order Specific Question:  Number of strips    Answer:  100    Order Specific Question:  Number of lancets    Answer:  100    I personally performed the Glenn described in this documentation, which was scribed in my presence. The recorded information has been reviewed and considered, and addended by me as needed.  Kimberly Cheadle, MD MPH  Results for orders placed or performed in visit on 09/14/14  Comprehensive metabolic panel  Result Value Ref Range   Sodium 134 (L) 135 - 145 mEq/L   Potassium 3.9 3.5 - 5.3 mEq/L   Chloride 100 96 - 112 mEq/L   CO2 28 19 - 32 mEq/L   Glucose, Bld 150 (H) 70 - 99 mg/dL   BUN 9 6 - 23 mg/dL   Creat 0.57 0.50 - 1.10 mg/dL   Total Bilirubin 0.4 0.2 - 1.2 mg/dL   Alkaline Phosphatase 79 39 - 117 U/L   AST 32 0 - 37 U/L  ALT 51 (H) 0 - 35 U/L   Total Protein 7.1 6.0 - 8.3 g/dL   Albumin 3.9 3.5 - 5.2 g/dL   Calcium 9.0 8.4 - 10.5 mg/dL  TSH  Result Value Ref Range   TSH 0.780 0.350 - 4.500 uIU/mL  Microalbumin/Creatinine Ratio, Urine  Result Value Ref Range   Microalb, Ur 2.0 <2.0 mg/dL   Creatinine, Urine 60.7 mg/dL   Microalb Creat Ratio 32.9 (H) 0.0 - 30.0 mg/g  POCT urine pregnancy  Result Value Ref Range   Preg Test, Ur Negative   POCT glucose (manual entry)  Result Value Ref Range   POC Glucose 145 (A) 70 - 99 mg/dl  POCT glycosylated hemoglobin (Hb A1C)  Result Value Ref Range   Hemoglobin A1C 7.4

## 2014-09-15 ENCOUNTER — Encounter: Payer: Self-pay | Admitting: Family Medicine

## 2014-09-15 LAB — MICROALBUMIN / CREATININE URINE RATIO
Creatinine, Urine: 60.7 mg/dL
Microalb Creat Ratio: 32.9 mg/g — ABNORMAL HIGH (ref 0.0–30.0)
Microalb, Ur: 2 mg/dL

## 2014-09-15 LAB — TSH: TSH: 0.78 u[IU]/mL (ref 0.350–4.500)

## 2014-09-21 ENCOUNTER — Encounter (HOSPITAL_COMMUNITY): Payer: Self-pay

## 2014-09-21 ENCOUNTER — Emergency Department (HOSPITAL_COMMUNITY)
Admission: EM | Admit: 2014-09-21 | Discharge: 2014-09-21 | Disposition: A | Discharge status: Home / Self Care | Payer: BC Managed Care – PPO | Attending: Emergency Medicine | Admitting: Emergency Medicine

## 2014-09-21 DIAGNOSIS — R1013 Epigastric pain: Secondary | ICD-10-CM | POA: Insufficient documentation

## 2014-09-21 DIAGNOSIS — E119 Type 2 diabetes mellitus without complications: Secondary | ICD-10-CM | POA: Insufficient documentation

## 2014-09-21 DIAGNOSIS — R1110 Vomiting, unspecified: Secondary | ICD-10-CM | POA: Insufficient documentation

## 2014-09-21 DIAGNOSIS — R197 Diarrhea, unspecified: Secondary | ICD-10-CM | POA: Diagnosis not present

## 2014-09-21 DIAGNOSIS — Z9889 Other specified postprocedural states: Secondary | ICD-10-CM | POA: Diagnosis not present

## 2014-09-21 DIAGNOSIS — Z79899 Other long term (current) drug therapy: Secondary | ICD-10-CM | POA: Insufficient documentation

## 2014-09-21 DIAGNOSIS — N921 Excessive and frequent menstruation with irregular cycle: Secondary | ICD-10-CM

## 2014-09-21 DIAGNOSIS — Z8619 Personal history of other infectious and parasitic diseases: Secondary | ICD-10-CM | POA: Diagnosis not present

## 2014-09-21 DIAGNOSIS — R109 Unspecified abdominal pain: Secondary | ICD-10-CM

## 2014-09-21 DIAGNOSIS — E669 Obesity, unspecified: Secondary | ICD-10-CM | POA: Diagnosis not present

## 2014-09-21 DIAGNOSIS — Z3202 Encounter for pregnancy test, result negative: Secondary | ICD-10-CM | POA: Insufficient documentation

## 2014-09-21 DIAGNOSIS — I10 Essential (primary) hypertension: Secondary | ICD-10-CM | POA: Diagnosis not present

## 2014-09-21 DIAGNOSIS — R1084 Generalized abdominal pain: Secondary | ICD-10-CM | POA: Diagnosis present

## 2014-09-21 LAB — COMPREHENSIVE METABOLIC PANEL
ALBUMIN: 3.4 g/dL — AB (ref 3.5–5.2)
ALT: 40 U/L — AB (ref 0–35)
AST: 24 U/L (ref 0–37)
Alkaline Phosphatase: 102 U/L (ref 39–117)
Anion gap: 13 (ref 5–15)
BILIRUBIN TOTAL: 0.3 mg/dL (ref 0.3–1.2)
BUN: 13 mg/dL (ref 6–23)
CHLORIDE: 102 meq/L (ref 96–112)
CO2: 22 meq/L (ref 19–32)
Calcium: 8.9 mg/dL (ref 8.4–10.5)
Creatinine, Ser: 0.6 mg/dL (ref 0.50–1.10)
GFR calc Af Amer: >90 mL/min (ref >90)
Glucose, Bld: 220 mg/dL — ABNORMAL HIGH (ref 70–99)
Potassium: 4 mEq/L (ref 3.7–5.3)
Sodium: 137 mEq/L (ref 137–147)
Total Protein: 7.1 g/dL (ref 6.0–8.3)

## 2014-09-21 LAB — CBC WITH DIFFERENTIAL/PLATELET
BASOS ABS: 0 10*3/uL (ref 0.0–0.1)
Basophils Relative: 0 % (ref 0–1)
Eosinophils Absolute: 0.2 10*3/uL (ref 0.0–0.7)
Eosinophils Relative: 2 % (ref 0–5)
HCT: 42.3 % (ref 36.0–46.0)
Hemoglobin: 14.2 g/dL (ref 12.0–15.0)
LYMPHS ABS: 2.8 10*3/uL (ref 0.7–4.0)
LYMPHS PCT: 29 % (ref 12–46)
MCH: 28.5 pg (ref 26.0–34.0)
MCHC: 33.6 g/dL (ref 30.0–36.0)
MCV: 84.8 fL (ref 78.0–100.0)
Monocytes Absolute: 0.9 10*3/uL (ref 0.1–1.0)
Monocytes Relative: 9 % (ref 3–12)
NEUTROS PCT: 60 % (ref 43–77)
Neutro Abs: 5.6 10*3/uL (ref 1.7–7.7)
PLATELETS: 289 10*3/uL (ref 150–400)
RBC: 4.99 MIL/uL (ref 3.87–5.11)
RDW: 12.9 % (ref 11.5–15.5)
WBC: 9.5 10*3/uL (ref 4.0–10.5)

## 2014-09-21 LAB — PREGNANCY, URINE: PREG TEST UR: NEGATIVE

## 2014-09-21 LAB — URINE MICROSCOPIC-ADD ON

## 2014-09-21 LAB — I-STAT TROPONIN, ED: Troponin i, poc: 0.02 ng/mL (ref 0.00–0.08)

## 2014-09-21 LAB — URINALYSIS, ROUTINE W REFLEX MICROSCOPIC
BILIRUBIN URINE: NEGATIVE
Glucose, UA: >1000 mg/dL — AB
Hgb urine dipstick: NEGATIVE
Ketones, ur: NEGATIVE mg/dL
LEUKOCYTES UA: NEGATIVE
NITRITE: NEGATIVE
PH: 5 (ref 5.0–8.0)
Protein, ur: NEGATIVE mg/dL
Specific Gravity, Urine: 1.028 (ref 1.005–1.030)
Urobilinogen, UA: 0.2 mg/dL (ref 0.0–1.0)

## 2014-09-21 LAB — LIPASE, BLOOD: LIPASE: 24 U/L (ref 11–59)

## 2014-09-21 MED ORDER — SODIUM CHLORIDE 0.9 % IV SOLN: 1000.0000 mL | INTRAVENOUS | Status: DC

## 2014-09-21 MED ORDER — MORPHINE SULFATE 4 MG/ML IJ SOLN
4.0000 mg | Freq: Once | INTRAMUSCULAR | Status: AC
  Administered 2014-09-21: 4 mg via INTRAVENOUS
  Filled 2014-09-21: qty 1

## 2014-09-21 MED ORDER — DICYCLOMINE HCL 20 MG PO TABS: 20.0000 mg | ORAL_TABLET | Freq: Two times a day (BID) | ORAL | Status: DC

## 2014-09-21 MED ORDER — ONDANSETRON HCL 4 MG/2ML IJ SOLN
4.0000 mg | Freq: Once | INTRAMUSCULAR | Status: AC
  Administered 2014-09-21: 4 mg via INTRAVENOUS
  Filled 2014-09-21: qty 2

## 2014-09-21 MED ORDER — ONDANSETRON HCL 4 MG PO TABS: 4.0000 mg | ORAL_TABLET | Freq: Three times a day (TID) | ORAL | Status: DC | PRN

## 2014-09-21 MED ORDER — SODIUM CHLORIDE 0.9 % IV SOLN
1000.0000 mL | Freq: Once | INTRAVENOUS | Status: AC
  Administered 2014-09-21: 1000 mL via INTRAVENOUS

## 2014-09-21 NOTE — ED Provider Notes (Signed)
CSN: [id number]     Arrival date & time 09/21/14  0544 History   First MD Initiated Contact with Patient 09/21/14 [phone]     Chief Complaint  Patient presents with  . Abdominal Pain    Patient is a 38 y.o. female presenting with abdominal pain. The history is provided by the patient.  Abdominal Pain Pain location:  Generalized (everywhere) Pain quality: aching and cramping   Pain radiates to:  Does not radiate Pain severity:  Moderate Onset quality:  Gradual Duration:  6 hours Timing:  Constant Progression:  Worsening Context: awakening from sleep   Relieved by:  Nothing Ineffective treatments:  None tried Associated symptoms: diarrhea (no blood, multiple times (11 times)) and vomiting   Associated symptoms: no cough and no fever   Associated symptoms comment:  Pt did vomit twice yesterday    Past Medical History  Diagnosis Date  . Hypertension   . Diabetes mellitus   . Helicobacter positive gastritis 08/2014    s/p treatment with prevpak   Past Surgical History  Procedure Laterality Date  . Cesarean section     Family History  Problem Relation Age of Onset  . Heart disease Mother   . Diabetes Mother   . Hypertension Mother   . Cancer Father    History  Substance Use Topics  . Smoking status: Never Smoker   . Smokeless tobacco: Never Used  . Alcohol Use: Yes     Comment: socially   OB History    Gravida Para Term Preterm AB TAB SAB Ectopic Multiple Living   _0 Review of Systems  Constitutional: Negative for fever.  Respiratory: Negative for cough.   Gastrointestinal: Positive for vomiting, abdominal pain and diarrhea (no blood, multiple times (11 times)).  All other systems reviewed and are negative.     Allergies  Review of patient's allergies indicates no known allergies.  Home Medications   Prior to Admission medications   Medication Sig Start Date End Date Taking? Authorizing Provider  amoxicillin-clarithromycin-lansoprazole  [facility]) combo pack Follow package directions. Dispense as generic components Patient taking differently: Take by mouth 2 (two) times daily. Follow package directions. Dispense as generic components 09/06/14  Yes Roselee Culver, MD  esomeprazole (NEXIUM) 40 MG capsule Take 1 capsule (40 mg total) by mouth daily. 09/02/14  Yes Roselee Culver, MD  glimepiride (AMARYL) 4 MG tablet Take 0.5 tablets (2 mg total) by mouth daily before breakfast. 09/02/14  Yes Roselee Culver, MD  metFORMIN (GLUCOPHAGE) 500 MG tablet Take 500 mg by mouth 2 (two) times daily with a meal. 12/22/13  Yes Leandrew Koyanagi, MD  Blood Glucose Monitoring Suppl (BLOOD GLUCOSE METER KIT AND SUPPLIES) KIT Dispense based on patient and insurance preference. Use up to four times daily as directed. (FOR ICD-9 250.00, 250.01). 09/14/14   [place] Miranda Frese, MD  dicyclomine (BENTYL) 20 MG tablet Take 1 tablet (20 mg total) by mouth 2 (two) times daily. 09/21/14   Dorie Rank, MD  Norethindrone-Mestranol [facility]) 1-50 MG-MCG tablet Taper of 4-4-3-3-2-2-1-1-1 Patient not taking: Reported on 09/21/2014 06/26/14   Mancel Bale, PA-C  ondansetron (ZOFRAN) 4 MG tablet Take 1 tablet (4 mg total) by mouth every 8 (eight) hours as needed for nausea or vomiting. 09/21/14   Dorie Rank, MD   BP 115/72 mmHg  Pulse 73  Temp(Src) 97.8 F (36.6 C) (Oral)  Resp 18  SpO2 96%  LMP 08/19/2014 Physical Exam  Constitutional: No distress.  Obese   HENT:  Head: Normocephalic and atraumatic.  Right Ear: External ear normal.  Left Ear: External ear normal.  Eyes: Conjunctivae are normal. Right eye exhibits no discharge. Left eye exhibits no discharge. No scleral icterus.  Neck: Neck supple. No tracheal deviation present.  Cardiovascular: Normal rate, regular rhythm and intact distal pulses.   Pulmonary/Chest: Effort normal and breath sounds normal. No stridor. No respiratory distress. She has no wheezes. She has no rales.  Abdominal: Soft. Bowel sounds  are normal. She exhibits no distension, no ascites and no mass. There is tenderness in the epigastric area. There is no rebound and no guarding. No hernia.  Musculoskeletal: She exhibits no edema or tenderness.  Neurological: She is alert. She has normal strength. No cranial nerve deficit (no facial droop, extraocular movements intact, no slurred speech) or sensory deficit. She exhibits normal muscle tone. She displays no seizure activity. Coordination normal.  Skin: Skin is warm and dry. No rash noted. She is not diaphoretic.  Psychiatric: She has a normal mood and affect.  Nursing note and vitals reviewed.   ED Course  Procedures (including critical care time) Labs Review Labs Reviewed  COMPREHENSIVE METABOLIC PANEL - Abnormal; Notable for the following:    Glucose, Bld 220 (*)    Albumin 3.4 (*)    ALT 40 (*)    All other components within normal limits  URINALYSIS, ROUTINE W REFLEX MICROSCOPIC - Abnormal; Notable for the following:    APPearance HAZY (*)    Glucose, UA >1000 (*)    All other components within normal limits  URINE MICROSCOPIC-ADD ON - Abnormal; Notable for the following:    Squamous Epithelial / LPF MANY (*)    Bacteria, UA FEW (*)    All other components within normal limits  CBC WITH DIFFERENTIAL  LIPASE, BLOOD  PREGNANCY, URINE  I-STAT TROPOININ, ED    Medications  0.9 %  sodium chloride infusion (1,000 mLs Intravenous New Bag/Given 09/21/14 0751)    Followed by  0.9 %  sodium chloride infusion (not administered)  ondansetron (ZOFRAN) injection 4 mg (4 mg Intravenous Given 09/21/14 0751)  morphine 4 MG/ML injection 4 mg (4 mg Intravenous Given 09/21/14 0752)     MDM   Final diagnoses:  Abdominal pain, unspecified abdominal location  Diarrhea    Symptoms improved with treatment.  Generalized cramping with diarrhea.  Possible viral illness.  Doubt diverticulitis, obstruction.  On abx for h pylori but her symptoms started well after that medication.   Doubt c diff. Possible viral process.  rx zofran and bentyl.  Return for fever, vomiting, worsening sx  Follow up with PCP    Dorie Rank, MD 09/21/14 [phone]

## 2014-09-21 NOTE — ED Notes (Signed)
Pt reports left upper abdominal pain/burning and diarrhea. Abd pain started 4 hours ago. Denies nausea or vomiting. Pt also reporting left knee "burning" pain.

## 2014-09-21 NOTE — Discharge Instructions (Signed)
Abdominal Pain °Many things can cause abdominal pain. Usually, abdominal pain is not caused by a disease and will improve without treatment. It can often be observed and treated at home. Your health care provider will do a physical exam and possibly order blood tests and X-rays to help determine the seriousness of your pain. However, in many cases, more time must pass before a clear cause of the pain can be found. Before that point, your health care provider may not know if you need more testing or further treatment. °HOME CARE INSTRUCTIONS  °Monitor your abdominal pain for any changes. The following actions may help to alleviate any discomfort you are experiencing: °· Only take over-the-counter or prescription medicines as directed by your health care provider. °· Do not take laxatives unless directed to do so by your health care provider. °· Try a clear liquid diet (broth, tea, or water) as directed by your health care provider. Slowly move to a bland diet as tolerated. °SEEK MEDICAL CARE IF: °· You have unexplained abdominal pain. °· You have abdominal pain associated with nausea or diarrhea. °· You have pain when you urinate or have a bowel movement. °· You experience abdominal pain that wakes you in the night. °· You have abdominal pain that is worsened or improved by eating food. °· You have abdominal pain that is worsened with eating fatty foods. °· You have a fever. °SEEK IMMEDIATE MEDICAL CARE IF:  °· Your pain does not go away within 2 hours. °· You keep throwing up (vomiting). °· Your pain is felt only in portions of the abdomen, such as the right side or the left lower portion of the abdomen. °· You pass bloody or black tarry stools. °MAKE SURE YOU: °· Understand these instructions.   °· Will watch your condition.   °· Will get help right away if you are not doing well or get worse.   °Document Released: 07/25/2005 Document Revised: 10/20/2013 Document Reviewed: 06/24/2013 °[employer]® Patient Information  ©2015 [employer]. This information is not intended to replace advice given to you by your health care provider. Make sure you discuss any questions you have with your health care provider. ° °Diarrhea °Diarrhea is frequent loose and watery bowel movements. It can cause you to feel weak and dehydrated. Dehydration can cause you to become tired and thirsty, have a dry mouth, and have decreased urination that often is dark yellow. Diarrhea is a sign of another problem, most often an infection that will not last long. In most cases, diarrhea typically lasts 2-3 days. However, it can last longer if it is a sign of something more serious. It is important to treat your diarrhea as directed by your caregiver to lessen or prevent future episodes of diarrhea. °CAUSES  °Some common causes include: °· Gastrointestinal infections caused by viruses, bacteria, or parasites. °· Food poisoning or food allergies. °· Certain medicines, such as antibiotics, chemotherapy, and laxatives. °· Artificial sweeteners and fructose. °· Digestive disorders. °HOME CARE INSTRUCTIONS °· Ensure adequate fluid intake (hydration): Have 1 cup (8 oz) of fluid for each diarrhea episode. Avoid fluids that contain simple sugars or sports drinks, fruit juices, whole milk products, and sodas. Your urine should be clear or pale yellow if you are drinking enough fluids. Hydrate with an oral rehydration solution that you can purchase at pharmacies, retail stores, and online. You can prepare an oral rehydration solution at home by mixing the following ingredients together: °¨  - tsp table salt. °¨ ¾ tsp baking soda. °¨    tsp salt substitute containing potassium chloride. °¨ 1  tablespoons sugar. °¨ 1 L (34 oz) of water. °· Certain foods and beverages may increase the speed at which food moves through the gastrointestinal (GI) tract. These foods and beverages should be avoided and include: °¨ Caffeinated and alcoholic beverages. °¨ High-fiber foods, such as raw  fruits and vegetables, nuts, seeds, and whole grain breads and cereals. °¨ Foods and beverages sweetened with sugar alcohols, such as xylitol, sorbitol, and mannitol. °· Some foods may be well tolerated and may help thicken stool including: °¨ Starchy foods, such as rice, toast, pasta, low-sugar cereal, oatmeal, grits, baked potatoes, crackers, and bagels. °¨ Bananas. °¨ Applesauce. °· Add probiotic-rich foods to help increase healthy bacteria in the GI tract, such as yogurt and fermented milk products. °· Wash your hands well after each diarrhea episode. °· Only take over-the-counter or prescription medicines as directed by your caregiver. °· Take a warm bath to relieve any burning or pain from frequent diarrhea episodes. °SEEK IMMEDIATE MEDICAL CARE IF:  °· You are unable to keep fluids down. °· You have persistent vomiting. °· You have blood in your stool, or your stools are black and tarry. °· You do not urinate in 6-8 hours, or there is only a small amount of very dark urine. °· You have abdominal pain that increases or localizes. °· You have weakness, dizziness, confusion, or light-headedness. °· You have a severe headache. °· Your diarrhea gets worse or does not get better. °· You have a fever or persistent symptoms for more than 2-3 days. °· You have a fever and your symptoms suddenly get worse. °MAKE SURE YOU:  °· Understand these instructions. °· Will watch your condition. °· Will get help right away if you are not doing well or get worse. °Document Released: 10/05/2002 Document Revised: 03/01/2014 Document Reviewed: 06/22/2012 °ExitCare® Patient Information ©2015 [employer]. This information is not intended to replace advice given to you by your health care provider. Make sure you discuss any questions you have with your health care provider. ° °

## 2014-09-24 ENCOUNTER — Other Ambulatory Visit: Payer: Self-pay | Admitting: Emergency Medicine

## 2014-09-24 ENCOUNTER — Telehealth: Payer: Self-pay | Admitting: Physician Assistant

## 2014-09-24 MED ORDER — GLIMEPIRIDE 4 MG PO TABS: 8.0000 mg | ORAL_TABLET | Freq: Every day | ORAL | Status: DC

## 2014-09-24 NOTE — Telephone Encounter (Signed)
Patient has run out of Amaryl. The pharmacy told her she's taking it wrong, that their instructions are to take 4 mg, 1/2 tablet. She was previously taking 2 mg, but reports that at her visit on 11/05 she was instructed to increase it to 8 mg.  She also reports that at her 11/17 visit that dose was confirmed.  Chart reviewed. I find no documentation of dose change at either of the 2 previous visits. Advised her that I will relay this message to both providers of her last 2 visits for clarification.  She is to follow-up here tomorrow after a recent ED visit. I recommend that she request one of the providers she saw recently for continuity and clarification of this issue.

## 2014-09-25 MED ORDER — GLIMEPIRIDE 4 MG PO TABS: 8.0000 mg | ORAL_TABLET | Freq: Every day | ORAL | Status: DC

## 2014-09-25 NOTE — Telephone Encounter (Signed)
Pt was not taking her amaryl as prescribed. When I last saw her she was supposed to be on 8mg  of amaryl daily but was only taking 4mg .  I was hesitant to increase due to her concerns about weight gain and that she is still trying to conceive a pregnancy.  Therefore, I did not make any changes to her regimen at this time and referred her to endocrinology for management of her DM - she has a new pt appt with Dr. Letta Median on 12/9 at 10:15 a.m.  I guess in the meantime she should go ahead and increase her amaryl to 8 mg daily.

## 2014-09-27 NOTE — Telephone Encounter (Signed)
Spoke to pt she understands directions and has refill at her pharmacy. Pt reminded of her appt with Dr.Gerghe. She states she will be going to this appt.

## 2014-10-01 ENCOUNTER — Encounter (HOSPITAL_BASED_OUTPATIENT_CLINIC_OR_DEPARTMENT_OTHER): Payer: Self-pay | Admitting: *Deleted

## 2014-10-04 ENCOUNTER — Encounter (HOSPITAL_BASED_OUTPATIENT_CLINIC_OR_DEPARTMENT_OTHER): Payer: Self-pay | Admitting: *Deleted

## 2014-10-04 NOTE — Progress Notes (Signed)
NPO AFTER MN WITH EXCEPTION CLEAR LIQUIDS UNTIL 0700 (NO CREAM/MILK PRODUCTS).  ARRIVE AT 1130. CURRENT LAB RESULTS IN CHART AND EPIC. WILL TAKE NEXIUM AM DOS W/ SIPS OF WATER.

## 2014-10-06 ENCOUNTER — Encounter: Payer: Self-pay | Admitting: Internal Medicine

## 2014-10-06 ENCOUNTER — Ambulatory Visit (INDEPENDENT_AMBULATORY_CARE_PROVIDER_SITE_OTHER): Payer: BC Managed Care – PPO | Admitting: Internal Medicine

## 2014-10-06 VITALS — BP 134/82 | Temp 98.3°F | Resp 14 | Ht 62.0 in | Wt 272.0 lb

## 2014-10-06 DIAGNOSIS — IMO0002 Reserved for concepts with insufficient information to code with codable children: Secondary | ICD-10-CM

## 2014-10-06 DIAGNOSIS — E282 Polycystic ovarian syndrome: Secondary | ICD-10-CM

## 2014-10-06 DIAGNOSIS — E1165 Type 2 diabetes mellitus with hyperglycemia: Secondary | ICD-10-CM

## 2014-10-06 NOTE — Progress Notes (Signed)
Patient ID: [name], female   DOB: November 23, 1975, 38 y.o.   MRN: [id number]  HPI: [name] is a 38 y.o.-year-old female, referred by her PCP, Dr. Linna Darner, for management of DM2, dx 2013, non-insulin-dependent, uncontrolled, without complications.  She also has a h/o PCOS.She is interested in conceiving. She has been trying for the last 2 years. She tells me that 10 years ago shehad irregular periods >> given OCPs >> then stopped >> cycles normalized. Now she has the last menstrual cycle 2 mo ago. She never saw a fertility specialist.  Last hemoglobin A1c was: Lab Results  Component Value Date   HGBA1C 7.4 09/14/2014   HGBA1C 9.0 04/21/2014   HGBA1C 7.3 12/22/2013   Pt is on a regimen of: - Metformin 1000 mg in am and 1000 mg after lunch or dinner - Amaryl 8 mg a day   Pt does not checks her sugars at home - has a meter but does not know how to use it. Lowest: 117. She has hypoglycemia awareness at 70.  Highest sugar was 237.  Pt's meals are: - b'fast or banana or apple - lunch (11 am): eggs, milk, [employer] - Dinner (6 pm): chicken with vegetables; chicken livers; soup; no fried food - Snacks: sometimes Lays chips She stopped buying iced coffee. She tries to stay away from sodas. She stopped drinking a 2L soda a day 1.5 years ago.  She has nutrition appt coming up 10/27/2014.   - no CKD, last BUN/creatinine:  Lab Results  Component Value Date   BUN 13 09/21/2014   CREATININE 0.60 09/21/2014  Not on ACEI. - last set of lipids: No results found for: CHOL, HDL, LDLCALC, LDLDIRECT, TRIG, CHOLHDL - last eye exam was in 11/30/2013. No DR.  - no numbness and tingling in her feet.  She has a carpal tunnel sx tomorrow  Pt has FH of DM in mother, aunt.  ROS: Constitutional: + weight gain, + fatigue, + subjective hyperthermia, + Poor sleep,+  nocturia Eyes: +  occasional blurry vision, no xerophthalmia ENT: no sore throat, no nodules palpated in throat,  no dysphagia/odynophagia, no hoarseness Cardiovascular: no CP/SOB/palpitations/leg swelling Respiratory: no cough/SOB Gastrointestinal: + N/+ V/+ D/no C Musculoskeletal: no muscle/joint aches Skin: no rashes Neurological: no tremors/numbness/tingling/dizziness Psychiatric: no depression/anxiety  Past Medical History  Diagnosis Date  . H. pylori infection     dx  09-02-2014-- treated w/ antibiotic  . GERD (gastroesophageal reflux disease)   . Type 2 diabetes mellitus   . Carpal tunnel syndrome, bilateral   . PCOS (polycystic ovarian syndrome)   . Menorrhagia   . Irregular menstrual cycle    Past Surgical History  Procedure Laterality Date  . Cesarean section  1995   History   Social History  . Marital Status:  married    Spouse Name: N/A    Number of Children: :70 and 1  Years old   Occupational History  .    Social History Main Topics  . Smoking status: Never Smoker   . Smokeless tobacco: Never Used  . Alcohol Use: Yes     Comment: socially  . Drug Use: No  . Sexual Activity:    Partners: Male    Birth Control/ Protection: None   Current Outpatient Prescriptions on File Prior to Visit  Medication Sig Dispense Refill  . glimepiride (AMARYL) 4 MG tablet Take 2 tablets (8 mg total) by mouth daily before breakfast. 60 tablet 1  . metFORMIN (GLUCOPHAGE) 500 MG tablet  Take 1,000 mg by mouth 2 (two) times daily with a meal.     . ondansetron (ZOFRAN) 4 MG tablet Take 1 tablet (4 mg total) by mouth every 8 (eight) hours as needed for nausea or vomiting. 12 tablet 0  . amoxicillin-clarithromycin-lansoprazole (PREVPAC) combo pack Follow package directions. Dispense as generic components (Patient not taking: Reported on 10/06/2014) 1 kit 0  . dicyclomine (BENTYL) 20 MG tablet Take 1 tablet (20 mg total) by mouth 2 (two) times daily. (Patient not taking: Reported on 10/06/2014) 20 tablet 0  . esomeprazole (NEXIUM) 40 MG capsule Take 1 capsule (40 mg total) by mouth daily.  (Patient not taking: Reported on 10/06/2014) 30 capsule 3   No current facility-administered medications on file prior to visit.   Allergies  Allergen Reactions  . Peach [Prunus Persica] Anaphylaxis   Family History  Problem Relation Age of Onset  . Heart disease Mother   . Diabetes Mother   . Hypertension Mother   . Cancer Father    PE: BP 134/82 mmHg  Temp(Src) 98.3 F (36.8 C) (Oral)  Resp 14  Ht '5\' 2"'  (1.575 m)  Wt 272 lb (123.378 kg)  BMI 49.74 kg/m2 Wt Readings from Last 3 Encounters:  10/06/14 272 lb (123.378 kg)  09/14/14 271 lb (122.925 kg)  09/02/14 268 lb (121.564 kg)   Constitutional:  obese, in NAD Eyes: PERRLA, EOMI, no exophthalmos ENT: moist mucous membranes, no thyromegaly, no cervical lymphadenopathy Cardiovascular: RRR, No MRG Respiratory: CTA B Gastrointestinal: abdomen soft, NT, ND, BS+ Musculoskeletal: no deformities, strength intact in all 4 Skin: moist, warm, no rashes; +  Acanthosis nigricans on neck Neurological: no tremor with outstretched hands, DTR normal in all 4  ASSESSMENT: 1. DM2, non-insulin-dependent, uncontrolled, without complications  2. PCOS  PLAN:  1. Patient with long-standing, uncontrolled diabetes, on oral antidiabetic regimen  With good improvement of her sugars, as reflecting her latest hemoglobin A1c , which decreased  By 1.6%. This is mostly due to increasing her medicines and watching her diet. She is still frustrated that she didn't lose weight , however, the sugars are much improved. It is difficult to decide about changing the treatment when her hemoglobin A1c is much better and without having blood sugars. She did not bring her meter with her, but she plans to go to the pharmacy and have them show her how to check her CBGs. I encouraged her to do that. -  We discussed about the fact that she is planning to have a new pregnancy,  And in this case, we will need a slightly different treatment approach to her diabetes, that  includes insulin. However, her pregnancy is not imminent now, and she has to see a fertility specialist first and then decide whether she can get pregnant. If she starts fertility treatment, we can switch to mealtime insulin at that time. I advised her to let me know. For now, I suggested to:  Patient Instructions  Please continue: - Metformin 1000 mg 2x a day - Amaryl 8 mg in am  Please start checking your sugars 1-2x a day and bring log at next visit.  Please return in 1.5 months with your sugar log.  -  She would see nutrition at the end of this month - Strongly advised her to start checking sugars at different times of the day - check 2 times a day, rotating checks - given sugar log and advised how to fill it and to bring it at next appt  -  given foot care handout and explained the principles  - given instructions for hypoglycemia management "15-15 rule"  - advised for yearly eye exams,  She is up-to-date - she had the flu vaccine this season - Return to clinic in 1.5 mo with sugar log   2. PCOS -  She is on metformin,  Continue for now -  She is not on OCPs  And she is trying to conceive -  She will  Fertility specialist soon -  She can restart oral contraceptives  If she decides  Against the pregnancy or if she cannot get pregnant.

## 2014-10-06 NOTE — Patient Instructions (Signed)
Please continue: - Metformin 1000 mg 2x a day - Amaryl 8 mg in am  Please start checking your sugars 1-2x a day and bring log at next visit.  Please return in 1.5 months with your sugar log.   PATIENT INSTRUCTIONS FOR TYPE 2 DIABETES:  **Please join MyChart!** - see attached instructions about how to join if you have not done so already.  DIET AND EXERCISE Diet and exercise is an important part of diabetic treatment.  We recommended aerobic exercise in the form of brisk walking (working between 40-60% of maximal aerobic capacity, similar to brisk walking) for 150 minutes per week (such as 30 minutes five days per week) along with 3 times per week performing 'resistance' training (using various gauge rubber tubes with handles) 5-10 exercises involving the major muscle groups (upper body, lower body and core) performing 10-15 repetitions (or near fatigue) each exercise. Start at half the above goal but build slowly to reach the above goals. If limited by weight, joint pain, or disability, we recommend daily walking in a swimming pool with water up to waist to reduce pressure from joints while allow for adequate exercise.    BLOOD GLUCOSES Monitoring your blood glucoses is important for continued management of your diabetes. Please check your blood glucoses 2-4 times a day: fasting, before meals and at bedtime (you can rotate these measurements - e.g. one day check before the 3 meals, the next day check before 2 of the meals and before bedtime, etc.).   HYPOGLYCEMIA (low blood sugar) Hypoglycemia is usually a reaction to not eating, exercising, or taking too much insulin/ other diabetes drugs.  Symptoms include tremors, sweating, hunger, confusion, headache, etc. Treat IMMEDIATELY with 15 grams of Carbs: . 4 glucose tablets .  cup regular juice/soda . 2 tablespoons raisins . 4 teaspoons sugar . 1 tablespoon honey Recheck blood glucose in 15 mins and repeat above if still symptomatic/blood  glucose <100.  RECOMMENDATIONS TO REDUCE YOUR RISK OF DIABETIC COMPLICATIONS: * Take your prescribed MEDICATION(S) * Follow a DIABETIC diet: Complex carbs, fiber rich foods, (monounsaturated and polyunsaturated) fats * AVOID saturated/trans fats, high fat foods, >2,300 mg salt per day. * EXERCISE at least 5 times a week for 30 minutes or preferably daily.  * DO NOT SMOKE OR DRINK more than 1 drink a day. * Check your FEET every day. Do not wear tightfitting shoes. Contact us if you develop an ulcer * See your EYE doctor once a year or more if needed * Get a FLU shot once a year * Get a PNEUMONIA vaccine once before and once after age 67 years  GOALS:  * Your Hemoglobin A1c of <7%  * fasting sugars need to be <130 * after meals sugars need to be <180 (2h after you start eating) * Your Systolic BP should be 017 or lower  * Your Diastolic BP should be 80 or lower  * Your HDL (Good Cholesterol) should be 40 or higher  * Your LDL (Bad Cholesterol) should be 100 or lower. * Your Triglycerides should be 150 or lower  * Your Urine microalbumin (kidney function) should be <30 * Your Body Mass Index should be 25 or lower    Please consider the following ways to cut down carbs and fat and increase fiber and micronutrients in your diet: - substitute whole grain for white bread or pasta - substitute brown rice for white rice - substitute 90-calorie flat bread pieces for slices of bread when possible -  substitute sweet potatoes or yams for white potatoes - substitute humus for margarine - substitute tofu for cheese when possible - substitute almond or rice milk for regular milk (would not drink soy milk daily due to concern for soy estrogen influence on breast cancer risk) - substitute dark chocolate for other sweets when possible - substitute water - can add lemon or orange slices for taste - for diet sodas (artificial sweeteners will trick your body that you can eat sweets without getting  calories and will lead you to overeating and weight gain in the long run) - do not skip breakfast or other meals (this will slow down the metabolism and will result in more weight gain over time)  - can try smoothies made from fruit and almond/rice milk in am instead of regular breakfast - can also try old-fashioned (not instant) oatmeal made with almond/rice milk in am - order the dressing on the side when eating salad at a restaurant (pour less than half of the dressing on the salad) - eat as little meat as possible - can try juicing, but should not forget that juicing will get rid of the fiber, so would alternate with eating raw veg./fruits or drinking smoothies - use as little oil as possible, even when using olive oil - can dress a salad with a mix of balsamic vinegar and lemon juice, for e.g. - use agave nectar, stevia sugar, or regular sugar rather than artificial sweateners - steam or broil/roast veggies  - snack on veggies/fruit/nuts (unsalted, preferably) when possible, rather than processed foods - reduce or eliminate aspartame in diet (it is in diet sodas, chewing gum, etc) Read the labels!  Try to read Dr. Janene Harvey book: "Program for Reversing Diabetes" for other ideas for healthy eating.

## 2014-10-06 NOTE — Brief Op Note (Signed)
10/07/2014  10:28 PM  PATIENT:  [name]  38 y.o. female  PRE-OPERATIVE DIAGNOSIS:  bilateral carpal tunnel syndrome  POST-OPERATIVE DIAGNOSIS:  * No post-op diagnosis entered *  PROCEDURE:  Procedure(s): CARPAL TUNNEL RELEASE BILATERAL (Bilateral)  SURGEON:  Surgeon(s) and Role:    * [name], MD - Primary  PHYSICIAN ASSISTANT:   ASSISTANTS: none   ANESTHESIA:   MAC  EBL:     BLOOD ADMINISTERED:none  DRAINS: none   LOCAL MEDICATIONS USED:  MARCAINE     SPECIMEN:  No Specimen  DISPOSITION OF SPECIMEN:  N/A  COUNTS:  YES  TOURNIQUET:  * No tourniquets in log *  DICTATION: .Other Dictation: Dictation Number [id number]  PLAN OF CARE: Discharge to home after PACU  PATIENT DISPOSITION:  PACU - hemodynamically stable.   Delay start of Pharmacological VTE agent (>24hrs) due to surgical blood loss or risk of bleeding: not applicable

## 2014-10-06 NOTE — H&P (Signed)
[name] is an 38 y.o. female.   Chief Complaint: N/T IN BOTH HANDS HPI: PT FOLLOWED IN OFFICE  PT WITH BILATERAL CARPAL TUNNEL PT HERE FOR SURGERY ON BOTH HANDS NO PRIOR SURGERY TO BOTH HANDS  Past Medical History  Diagnosis Date  . H. pylori infection     dx  09-02-2014-- treated w/ antibiotic  . GERD (gastroesophageal reflux disease)   . Type 2 diabetes mellitus   . Carpal tunnel syndrome, bilateral   . PCOS (polycystic ovarian syndrome)   . Menorrhagia   . Irregular menstrual cycle     Past Surgical History  Procedure Laterality Date  . Cesarean section  1995    Family History  Problem Relation Age of Onset  . Heart disease Mother   . Diabetes Mother   . Hypertension Mother   . Cancer Father    Social History:  reports that she has never smoked. She has never used smokeless tobacco. She reports that she drinks alcohol. She reports that she does not use illicit drugs.  Allergies:  Allergies  Allergen Reactions  . Peach [Prunus Persica] Anaphylaxis    No prescriptions prior to admission    No results found for this or any previous visit (from the past 48 hour(s)). No results found.  ROS NO RECENT ILLNESSES OR HOSPITALIZATIONS  Height 5\' 2"  (1.575 m), weight 122.471 kg (270 lb), last menstrual period 08/24/2014. Physical Exam  General Appearance:  Alert, cooperative, no distress, appears stated age  Head:  Normocephalic, without obvious abnormality, atraumatic  Eyes:  Pupils equal, conjunctiva/corneas clear,         Throat: Lips, mucosa, and tongue normal; teeth and gums normal  Neck: No visible masses     Lungs:   respirations unlabored  Chest Wall:  No tenderness or deformity  Heart:  Regular rate and rhythm,  Abdomen:   Soft, non-tender,         Extremities: BOTH HANDS: FINGERS WARM WELL PERFUSED ABLE TO PALMAR ABDUCT THUMBS GOOD DIGITAL MOTION  Pulses: 2+ and symmetric  Skin: Skin color, texture, turgor normal, no rashes or  lesions     Neurologic: Normal    Assessment/Plan BILATERAL CARPAL TUNNEL SYNDROME  RIGHT AND LEFT CARPAL TUNNEL RELEASE  R/B/A DISCUSSED WITH PT IN OFFICE.  PT VOICED UNDERSTANDING OF PLAN CONSENT SIGNED DAY OF SURGERY PT SEEN AND EXAMINED PRIOR TO OPERATIVE PROCEDURE/DAY OF SURGERY SITE MARKED. QUESTIONS ANSWERED WILL GO HOME FOLLOWING SURGERY  WE ARE PLANNING SURGERY FOR YOUR UPPER EXTREMITY. THE RISKS AND BENEFITS OF SURGERY INCLUDE BUT NOT LIMITED TO BLEEDING INFECTION, DAMAGE TO NEARBY NERVES ARTERIES TENDONS, FAILURE OF SURGERY TO ACCOMPLISH ITS INTENDED GOALS, PERSISTENT SYMPTOMS AND NEED FOR FURTHER SURGICAL INTERVENTION. WITH THIS IN MIND WE WILL PROCEED. I HAVE DISCUSSED WITH THE PATIENT THE PRE AND POSTOPERATIVE REGIMEN AND THE DOS AND DON'TS. PT VOICED UNDERSTANDING AND INFORMED CONSENT SIGNED.  [name] 10/07/2014 @ 1253

## 2014-10-07 ENCOUNTER — Encounter (HOSPITAL_BASED_OUTPATIENT_CLINIC_OR_DEPARTMENT_OTHER): Payer: Self-pay | Admitting: *Deleted

## 2014-10-07 ENCOUNTER — Ambulatory Visit (HOSPITAL_BASED_OUTPATIENT_CLINIC_OR_DEPARTMENT_OTHER): Payer: BC Managed Care – PPO | Admitting: Anesthesiology

## 2014-10-07 ENCOUNTER — Encounter (HOSPITAL_BASED_OUTPATIENT_CLINIC_OR_DEPARTMENT_OTHER)
Admission: RE | Disposition: A | Discharge status: Home / Self Care | Payer: Self-pay | Source: Ambulatory Visit | Attending: Orthopedic Surgery

## 2014-10-07 ENCOUNTER — Ambulatory Visit (HOSPITAL_BASED_OUTPATIENT_CLINIC_OR_DEPARTMENT_OTHER)
Admission: RE | Admit: 2014-10-07 | Discharge: 2014-10-07 | Disposition: A | Discharge status: Home / Self Care | Payer: BC Managed Care – PPO | Source: Ambulatory Visit | Attending: Orthopedic Surgery | Admitting: Orthopedic Surgery

## 2014-10-07 DIAGNOSIS — E119 Type 2 diabetes mellitus without complications: Secondary | ICD-10-CM | POA: Diagnosis not present

## 2014-10-07 DIAGNOSIS — E282 Polycystic ovarian syndrome: Secondary | ICD-10-CM | POA: Insufficient documentation

## 2014-10-07 DIAGNOSIS — G5602 Carpal tunnel syndrome, left upper limb: Secondary | ICD-10-CM | POA: Insufficient documentation

## 2014-10-07 DIAGNOSIS — K219 Gastro-esophageal reflux disease without esophagitis: Secondary | ICD-10-CM | POA: Diagnosis not present

## 2014-10-07 DIAGNOSIS — E669 Obesity, unspecified: Secondary | ICD-10-CM | POA: Diagnosis not present

## 2014-10-07 DIAGNOSIS — G5601 Carpal tunnel syndrome, right upper limb: Secondary | ICD-10-CM

## 2014-10-07 HISTORY — DX: Type 2 diabetes mellitus without complications: E11.9

## 2014-10-07 HISTORY — DX: Irregular menstruation, unspecified: N92.6

## 2014-10-07 HISTORY — DX: Polycystic ovarian syndrome: E28.2

## 2014-10-07 HISTORY — PX: CARPAL TUNNEL RELEASE: SHX101

## 2014-10-07 HISTORY — DX: Carpal tunnel syndrome, bilateral upper limbs: G56.03

## 2014-10-07 HISTORY — DX: Other specified bacterial intestinal infections: A04.8

## 2014-10-07 HISTORY — DX: Gastro-esophageal reflux disease without esophagitis: K21.9

## 2014-10-07 HISTORY — DX: Excessive and frequent menstruation with regular cycle: N92.0

## 2014-10-07 LAB — POCT PREGNANCY, URINE
PREG TEST UR: NEGATIVE
Preg Test, Ur: NEGATIVE

## 2014-10-07 LAB — GLUCOSE, CAPILLARY
Glucose-Capillary: 124 mg/dL — ABNORMAL HIGH (ref 70–99)
Glucose-Capillary: 112 mg/dL — ABNORMAL HIGH (ref 70–99)

## 2014-10-07 SURGERY — CARPAL TUNNEL RELEASE
Anesthesia: Monitor Anesthesia Care | Site: Wrist | Laterality: Bilateral

## 2014-10-07 MED ORDER — CEFAZOLIN SODIUM 1 G IJ SOLR
INTRAMUSCULAR | Status: AC
  Filled 2014-10-07: qty 10

## 2014-10-07 MED ORDER — LIDOCAINE HCL (PF) 1 % IJ SOLN
INTRAMUSCULAR | Status: DC | PRN
  Administered 2014-10-07: 20 mL

## 2014-10-07 MED ORDER — HYDROCODONE-ACETAMINOPHEN 5-300 MG PO TABS: 1.0000 | ORAL_TABLET | Freq: Four times a day (QID) | ORAL | Status: DC | PRN

## 2014-10-07 MED ORDER — FENTANYL CITRATE 0.05 MG/ML IJ SOLN
25.0000 ug | INTRAMUSCULAR | Status: DC | PRN
  Filled 2014-10-07: qty 1

## 2014-10-07 MED ORDER — MIDAZOLAM HCL 5 MG/5ML IJ SOLN
INTRAMUSCULAR | Status: DC | PRN
  Administered 2014-10-07: 2 mg via INTRAVENOUS

## 2014-10-07 MED ORDER — CHLORHEXIDINE GLUCONATE 4 % EX LIQD
60.0000 mL | Freq: Once | CUTANEOUS | Status: DC
  Filled 2014-10-07: qty 60

## 2014-10-07 MED ORDER — DEXTROSE 5 % IV SOLN
3.0000 g | INTRAVENOUS | Status: AC
  Administered 2014-10-07: 3 g via INTRAVENOUS
  Filled 2014-10-07: qty 3000

## 2014-10-07 MED ORDER — ONDANSETRON HCL 4 MG/2ML IJ SOLN
4.0000 mg | Freq: Once | INTRAMUSCULAR | Status: DC | PRN
  Filled 2014-10-07: qty 2

## 2014-10-07 MED ORDER — FENTANYL CITRATE 0.05 MG/ML IJ SOLN
INTRAMUSCULAR | Status: DC | PRN
  Administered 2014-10-07: 50 ug via INTRAVENOUS

## 2014-10-07 MED ORDER — BUPIVACAINE HCL (PF) 0.25 % IJ SOLN
INTRAMUSCULAR | Status: DC | PRN
  Administered 2014-10-07: 20 mL

## 2014-10-07 MED ORDER — LACTATED RINGERS IV SOLN
INTRAVENOUS | Status: DC
  Administered 2014-10-07 (×2): via INTRAVENOUS
  Filled 2014-10-07: qty 1000

## 2014-10-07 MED ORDER — DOCUSATE SODIUM 100 MG PO CAPS: 100.0000 mg | ORAL_CAPSULE | Freq: Two times a day (BID) | ORAL | Status: DC

## 2014-10-07 MED ORDER — MIDAZOLAM HCL 2 MG/2ML IJ SOLN
INTRAMUSCULAR | Status: AC
  Filled 2014-10-07: qty 2

## 2014-10-07 MED ORDER — FENTANYL CITRATE 0.05 MG/ML IJ SOLN
INTRAMUSCULAR | Status: AC
  Filled 2014-10-07: qty 2

## 2014-10-07 MED ORDER — PROPOFOL 10 MG/ML IV BOLUS
INTRAVENOUS | Status: DC | PRN
  Administered 2014-10-07: 50 mg via INTRAVENOUS

## 2014-10-07 MED ORDER — CEFAZOLIN SODIUM-DEXTROSE 2-3 GM-% IV SOLR
INTRAVENOUS | Status: AC
  Filled 2014-10-07: qty 50

## 2014-10-07 SURGICAL SUPPLY — 36 items
BANDAGE ELASTIC 3 VELCRO ST LF (GAUZE/BANDAGES/DRESSINGS) ×3 IMPLANT
BLADE SURG 15 STRL LF DISP TIS (BLADE) ×1 IMPLANT
BLADE SURG 15 STRL SS (BLADE) ×4
BNDG CMPR 9X4 STRL LF SNTH (GAUZE/BANDAGES/DRESSINGS) ×1
BNDG CONFORM 3 STRL LF (GAUZE/BANDAGES/DRESSINGS) ×3 IMPLANT
BNDG ESMARK 4X9 LF (GAUZE/BANDAGES/DRESSINGS) ×2 IMPLANT
CORDS BIPOLAR (ELECTRODE) ×2 IMPLANT
COVER TABLE BACK 60X90 (DRAPES) ×2 IMPLANT
CUFF TOURNIQUET SINGLE 18IN (TOURNIQUET CUFF) ×2 IMPLANT
DRAPE EXTREMITY T 121X128X90 (DRAPE) ×3 IMPLANT
DRAPE LG THREE QUARTER DISP (DRAPES) ×3 IMPLANT
DRAPE SURG 17X23 STRL (DRAPES) ×3 IMPLANT
DRSG EMULSION OIL 3X3 NADH (GAUZE/BANDAGES/DRESSINGS) IMPLANT
GAUZE XEROFORM 1X8 LF (GAUZE/BANDAGES/DRESSINGS) ×2 IMPLANT
GLOVE BIO SURGEON STRL SZ8 (GLOVE) ×3 IMPLANT
GLOVE BIOGEL PI IND STRL 8.5 (GLOVE) ×1 IMPLANT
GLOVE BIOGEL PI INDICATOR 8.5 (GLOVE) ×2
GOWN STRL REUS W/ TWL LRG LVL3 (GOWN DISPOSABLE) ×1 IMPLANT
GOWN STRL REUS W/ TWL XL LVL3 (GOWN DISPOSABLE) ×1 IMPLANT
GOWN STRL REUS W/TWL LRG LVL3 (GOWN DISPOSABLE) ×4
GOWN STRL REUS W/TWL XL LVL3 (GOWN DISPOSABLE) ×4
NDL HYPO 25X1 1.5 SAFETY (NEEDLE) ×2 IMPLANT
NEEDLE HYPO 25X1 1.5 SAFETY (NEEDLE) ×4 IMPLANT
NS IRRIG 500ML POUR BTL (IV SOLUTION) ×2 IMPLANT
PACK BASIN DAY SURGERY FS (CUSTOM PROCEDURE TRAY) ×2 IMPLANT
PAD ALCOHOL SWAB (MISCELLANEOUS) ×8 IMPLANT
PAD CAST 3X4 CTTN HI CHSV (CAST SUPPLIES) IMPLANT
PADDING CAST COTTON 3X4 STRL (CAST SUPPLIES) ×2
SPONGE GAUZE 4X4 12PLY STER LF (GAUZE/BANDAGES/DRESSINGS) ×2 IMPLANT
STOCKINETTE 4X48 STRL (DRAPES) ×2 IMPLANT
SUT PROLENE 4 0 PS 2 18 (SUTURE) ×3 IMPLANT
SYR BULB 3OZ (MISCELLANEOUS) ×2 IMPLANT
SYR CONTROL 10ML LL (SYRINGE) ×4 IMPLANT
TOWEL OR 17X24 6PK STRL BLUE (TOWEL DISPOSABLE) ×3 IMPLANT
TRAY DSU PREP LF (CUSTOM PROCEDURE TRAY) ×2 IMPLANT
UNDERPAD 30X30 INCONTINENT (UNDERPADS AND DIAPERS) ×3 IMPLANT

## 2014-10-07 NOTE — Discharge Instructions (Signed)
KEEP BANDAGE CLEAN AND DRY CALL OFFICE FOR F/U APPT [phone] IN 11 DAYS DR Caralyn Guile CELL [phone] KEEP HAND ELEVATED ABOVE HEART OK TO APPLY ICE TO OPERATIVE AREA CONTACT OFFICE IF ANY WORSENING PAIN OR CONCERNS.   Carpal Tunnel Release (Repair), Care After Refer to this sheet in the next few weeks. These discharge instructions provide you with general information on caring for yourself after you leave the hospital. Your caregiver may also give you specific instructions. Your treatment has been planned according to the most current medical practices available, but unavoidable complications sometimes occur. If you have any problems or questions after discharge, please call your caregiver. HOME CARE INSTRUCTIONS   Have a responsible person with you for 24 hours.  Do not drive a car or take public transportation for 24 hours.  Only take over-the-counter or prescription medicines for pain, discomfort, or fever as directed by your caregiver. Take them as directed.  You may put ice on the palm side of the affected wrist.  Put ice in a plastic bag.  Place a towel between your skin and the bag.  Leave the ice on for 15-20 minutes, 03-04 times per day.  If you were given a splint to keep your wrist from bending, use it as directed. It is important to wear the splint at night or as directed. Use the splint for as long as you have pain or numbness in your hand, arm, or wrist. This may take 1 to 2 months.  Keep your hand raised (elevated) above the level of your heart as much as possible. This keeps swelling down and helps with discomfort.  Change bandages (dressings) as directed.  Keep the wound clean and dry. SEEK MEDICAL CARE IF:   You develop pain not relieved with medicines.  You develop numbness of your hand.  You develop bleeding from your surgical site.  You have an oral temperature above 102 F (38.9 C).  You develop redness or swelling of the surgical site.  You develop  new, unexplained problems. SEEK IMMEDIATE MEDICAL CARE IF:   You develop a rash.  You have difficulty breathing.  You develop any reaction or side effects to medicines given. MAKE SURE YOU:   Understand these instructions.  Will watch your condition.  Will get help right away if you are not doing well or get worse. Document Released: 05/04/2005 Document Revised: 08/05/2013 Document Reviewed: 08/21/2007 [facility] Patient Information 2015 [place], [place]. This information is not intended to replace advice given to you by your health care provider. Make sure you discuss any questions you have with your health care provider.  Post Anesthesia Home Care Instructions  Activity: Get plenty of rest for the remainder of the day. A responsible adult should stay with you for 24 hours following the procedure.  For the next 24 hours, DO NOT: -Drive a car -Paediatric nurse -Drink alcoholic beverages -Take any medication unless instructed by your physician -Make any legal decisions or sign important papers.  Meals: Start with liquid foods such as gelatin or soup. Progress to regular foods as tolerated. Avoid greasy, spicy, heavy foods. If nausea and/or vomiting occur, drink only clear liquids until the nausea and/or vomiting subsides. Call your physician if vomiting continues.  Special Instructions/Symptoms: Your throat may feel dry or sore from the anesthesia or the breathing tube placed in your throat during surgery. If this causes discomfort, gargle with warm salt water. The discomfort should disappear within 24 hours.       HAND SURGERY  HOME CARE INSTRUCTIONS    The following instructions have been prepared to help you care for yourself upon your return home today.  Wound Care:  Keep your hand elevated above the level of your heart. Do not allow it to dangle by your side. Keep the dressing dry and do not remove it unless your doctor advises you to do so. He will usually change it at  the time of you post-op visit. Moving your fingers is advised to stimulate circulation but will depend on the site of your surgery. Of course, if you have a splint applied your doctor will advise you about movement.  Activity:  Do not drive or operate machinery today. Rest today and then you may return to your normal activity and work as indicated by your physician.  Diet: Drink liquids today or eat a light diet. You may resume a regular diet tomorrow.  General expectations: Pain for two or three days. Fingers may become slightly swollen.   Unexpected Observations- Call your doctor if any of these occur: Severe pain not relieved by pain medication. Elevated temperature. Dressing soaked with blood. Inability to move fingers. White or bluish color to fingers.

## 2014-10-07 NOTE — Anesthesia Preprocedure Evaluation (Addendum)
Anesthesia Evaluation  Patient identified by MRN, date of birth, ID band Patient awake    Reviewed: Allergy & Precautions, H&P , NPO status , Patient's Chart, lab work & pertinent test results  History of Anesthesia Complications Negative for: history of anesthetic complications  Airway Mallampati: III  TM Distance: >3 FB Neck ROM: Full    Dental no notable dental hx. (+) Dental Advisory Given   Pulmonary neg pulmonary ROS,  breath sounds clear to auscultation  Pulmonary exam normal       Cardiovascular negative cardio ROS  Rhythm:Regular Rate:Normal     Neuro/Psych negative neurological ROS  negative psych ROS   GI/Hepatic Neg liver ROS, GERD-  Medicated and Controlled,  Endo/Other  diabetes, Type 2, Oral Hypoglycemic AgentsMorbid obesity  Renal/GU negative Renal ROS  negative genitourinary   Musculoskeletal negative musculoskeletal ROS (+)   Abdominal (+) + obese,   Peds negative pediatric ROS (+)  Hematology negative hematology ROS (+)   Anesthesia Other Findings   Reproductive/Obstetrics negative OB ROS                            Anesthesia Physical Anesthesia Plan  ASA: III  Anesthesia Plan: MAC   Post-op Pain Management:    Induction: Intravenous  Airway Management Planned: Nasal Cannula  Additional Equipment:   Intra-op Plan:   Post-operative Plan: Extubation in OR  Informed Consent: I have reviewed the patients History and Physical, chart, labs and discussed the procedure including the risks, benefits and alternatives for the proposed anesthesia with the patient or authorized representative who has indicated his/her understanding and acceptance.   Dental advisory given  Plan Discussed with: CRNA  Anesthesia Plan Comments:        Anesthesia Quick Evaluation

## 2014-10-07 NOTE — Anesthesia Postprocedure Evaluation (Signed)
  Anesthesia Post-op Note  Patient: [name]  Procedure(s) Performed: Procedure(s) (LRB): CARPAL TUNNEL RELEASE BILATERAL (Bilateral)  Patient Location: PACU  Anesthesia Type: MAC  Level of Consciousness: awake and alert   Airway and Oxygen Therapy: Patient Spontanous Breathing  Post-op Pain: mild  Post-op Assessment: Post-op Vital signs reviewed, Patient's Cardiovascular Status Stable, Respiratory Function Stable, Patent Airway and No signs of Nausea or vomiting  Last Vitals:  Filed Vitals:   10/07/14 1445  BP: 147/81  Pulse: 69  Temp:   Resp: 21    Post-op Vital Signs: stable   Complications: No apparent anesthesia complications

## 2014-10-07 NOTE — Transfer of Care (Signed)
Immediate Anesthesia Transfer of Care Note  Patient: [name]  Procedure(s) Performed: Procedure(s) (LRB): CARPAL TUNNEL RELEASE BILATERAL (Bilateral)  Patient Location: PACU  Anesthesia Type: MAC  Level of Consciousness: awake, alert , oriented and patient cooperative  Airway & Oxygen Therapy: Patient Spontanous Breathing and Patient connected to face mask oxygen  Post-op Assessment: Report given to PACU RN and Post -op Vital signs reviewed and stable  Post vital signs: Reviewed and stable  Complications: No apparent anesthesia complications

## 2014-10-08 ENCOUNTER — Encounter (HOSPITAL_BASED_OUTPATIENT_CLINIC_OR_DEPARTMENT_OTHER): Payer: Self-pay | Admitting: Orthopedic Surgery

## 2014-10-08 NOTE — Op Note (Signed)
[name], BERT NO.:  [id number]  MEDICAL RECORD NO.:  [id number]  LOCATION:                                 FACILITY:  PHYSICIAN:  Melrose Nakayama, MD  DATE OF BIRTH:  [birth year]  DATE OF PROCEDURE:  10/07/2014 DATE OF DISCHARGE:  10/07/2014                              OPERATIVE REPORT   PREOPERATIVE DIAGNOSES: 1. Right hand carpal tunnel syndrome. 2. Left hand carpal tunnel syndrome.  POSTOPERATIVE DIAGNOSES: 1. Right hand carpal tunnel syndrome. 2. Left hand carpal tunnel syndrome.  ATTENDING PHYSICIAN:  Melrose Nakayama, MD who scrubbed and present for the entire procedure.  ASSISTANT SURGEON:  None.  ANESTHESIA:  1% Xylocaine, 0.25% Marcaine, local block with IV sedation.  SURGICAL PROCEDURE: 1. Right hand carpal tunnel release. 2. Left hand carpal tunnel release.  SURGICAL INDICATIONS:  [name] is a right-hand-dominant female with bilateral carpal tunnel syndrome.  This had been refractory to conservative treatment.  Risks, benefits, and alternatives were discussed in detail with the patient and signed informed consent was obtained.  Risks include, but not limited to bleeding, infection, damage to nearby nerves, arteries, or tendons, loss of motion of the wrist and digits, incomplete relief of symptoms, need for further surgical intervention.  DESCRIPTION OF PROCEDURE:  The patient was properly identified in the preoperative holding area and a mark with a permanent marker made on the left and right hand to indicate the correct operative site.  The patient was then brought back to the operating room, placed supine on the anesthesia table and the IV sedation was administered.  The patient tolerated this well.  Local anesthetics were administered.  A well- padded tourniquet was then placed on the left and right forearm.  Left upper extremity was then prepped and draped in normal sterile fashion. Time-out was called.  Correct site  was identified and procedure then begun.  Attention was then turned to the left hand where a longitudinal incision was made directly over the mid palm.  Tourniquet was then insufflated.  Dissection was carried down through the skin and subcutaneous tissue.  The palmar fascia was incised longitudinally. Direct exposure of the transverse carpal ligament was then carried out and under direct visualization, distal one-half of the transverse carpal ligament was released in its entirety.  Further exposure was then carried out proximally in remaining portion of transverse carpal ligament as well as portion of the antebrachial fascia and gluteus.  The wound was then thoroughly irrigated.  The contents of carpal canal were then inspected.  No other abnormalities were noted.  Tourniquet was deflated.  Hemostasis was then obtained.  Skin was then closed using horizontal mattress Prolene sutures.  Sterile compressive bandage was then applied.  The patient tolerated the procedure well.  Attention was then turned to the right upper extremity.  The right upper extremity was then prepped and draped in normal sterile fashion.  Time-out was called, correct side was identified, and the procedure then begun.  Several cm incision was made in the mid palm.  Dissection was carried down through the skin and subcutaneous tissue.  Palmar fascia incised longitudinally. Direct exposure of the transverse carpal ligament was then carried out under direct  visualization, distal one-half of the transverse carpal limb released in its entirety.  Further exposure was then carried out proximally.  Remaining portion of the transverse carpal limb as well as portion of the antebrachial fascia were then released.  The wound was then thoroughly irrigated.  After the wound was irrigated, contents of carpal canal were inspected, and no other abnormalities were noted.  The skin was reclosed using horizontal mattress nylon sutures.   Xeroform dressing and sterile compressive bandage were then applied.  The patient tolerated the procedure well, returned to the recovery room in good condition.  POSTPROCEDURAL PLAN:  The patient discharged home, seen back in the office in approximately 2 weeks for wound check, suture removal, and begin a postoperative carpal tunnel evaluation and treat.     Melrose Nakayama, MD     FWO/MEDQ  D:  10/07/2014  T:  10/08/2014  Job:  [id number]

## 2014-10-09 ENCOUNTER — Ambulatory Visit (INDEPENDENT_AMBULATORY_CARE_PROVIDER_SITE_OTHER): Payer: BC Managed Care – PPO | Admitting: Family Medicine

## 2014-10-09 VITALS — BP 120/82 | HR 76 | Temp 98.9°F | Resp 20 | Ht 62.0 in | Wt 271.1 lb

## 2014-10-09 DIAGNOSIS — Z9889 Other specified postprocedural states: Secondary | ICD-10-CM

## 2014-10-09 DIAGNOSIS — M25539 Pain in unspecified wrist: Secondary | ICD-10-CM

## 2014-10-09 LAB — POCT CBC
GRANULOCYTE PERCENT: 57.7 % (ref 37–80)
HCT, POC: 43.8 % (ref 37.7–47.9)
HEMOGLOBIN: 14.2 g/dL (ref 12.2–16.2)
LYMPH, POC: 3.5 — AB (ref 0.6–3.4)
MCH, POC: 27.5 pg (ref 27–31.2)
MCHC: 32.4 g/dL (ref 31.8–35.4)
MCV: 84.7 fL (ref 80–97)
MID (cbc): 0.7 (ref 0–0.9)
MPV: 7.6 fL (ref 0–99.8)
POC GRANULOCYTE: 5.7 (ref 2–6.9)
POC LYMPH PERCENT: 35.6 %L (ref 10–50)
POC MID %: 6.7 % (ref 0–12)
Platelet Count, POC: 358 10*3/uL (ref 142–424)
RBC: 5.17 M/uL (ref 4.04–5.48)
RDW, POC: 13.1 %
WBC: 9.9 10*3/uL (ref 4.6–10.2)

## 2014-10-09 MED ORDER — HYDROCODONE-ACETAMINOPHEN 5-300 MG PO TABS: 1.0000 | ORAL_TABLET | Freq: Four times a day (QID) | ORAL | Status: DC | PRN

## 2014-10-09 MED ORDER — OXYCODONE-ACETAMINOPHEN 5-325 MG PO TABS: 1.0000 | ORAL_TABLET | Freq: Three times a day (TID) | ORAL | Status: DC | PRN

## 2014-10-09 NOTE — Progress Notes (Signed)
[facility] [address]  [place] [address] [phone]  Date:  10/09/2014   Name:  [name]   DOB:  1976/01/05   MRN:  [id number]  PCP:  Ruben Reason, MD    Chief Complaint: Hand Swelling   History of Present Illness:  [name] is a 38 y.o. very pleasant female patient who presents with the following:  She had a bilateral carpal tunnel release on Thursday- today is Saturday.  She is here today due to swelling and pain in both hands.  She notes a sensation of pulsing in the left thumb, less so on the right. The left is more swollen.   She states that she feels dizzy and hot as well. No fever She had an rx for percoet sent in but she could not fill it because she did not have a paper rx. (Dr. Caralyn Guile is able to use electronic narcotic rx but it did not work for some reason).  She called [place] [facility] again this am and did not hear back so she came in.   She is here today with her daugther.   Her other health concerns include severe obesity and DM.  Her LMP was a couple of months ago but she has a recent negative HCG prior to surgery  Patient Active Problem List   Diagnosis Date Noted  . Severe obesity (BMI >= 40) 09/14/2014  . Irregular menstrual cycle 11/12/2013  . Type II diabetes mellitus, uncontrolled 06/20/2009  . POLYCYSTIC OVARIAN DISEASE 06/20/2009  . CARPAL TUNNEL SYNDROME 06/20/2009    Past Medical History  Diagnosis Date  . H. pylori infection     dx  09-02-2014-- treated w/ antibiotic  . GERD (gastroesophageal reflux disease)   . Type 2 diabetes mellitus   . Carpal tunnel syndrome, bilateral   . PCOS (polycystic ovarian syndrome)   . Menorrhagia   . Irregular menstrual cycle     Past Surgical History  Procedure Laterality Date  . Cesarean section  1995  . Carpal tunnel release Bilateral 10/07/2014    Procedure: CARPAL TUNNEL RELEASE BILATERAL;  Surgeon: [name], MD;  Location: [facility];  Service: Orthopedics;  Laterality: Bilateral;    History  Substance Use Topics  . Smoking status: Never Smoker   . Smokeless tobacco: Never Used  . Alcohol Use: Yes     Comment: socially    Family History  Problem Relation Age of Onset  . Heart disease Mother   . Diabetes Mother   . Hypertension Mother   . Cancer Father     Allergies  Allergen Reactions  . Peach [Prunus Persica] Anaphylaxis    Medication list has been reviewed and updated.  Current Outpatient Prescriptions on File Prior to Visit  Medication Sig Dispense Refill  . glimepiride (AMARYL) 4 MG tablet Take 2 tablets (8 mg total) by mouth daily before breakfast. 60 tablet 1  . Hydrocodone-Acetaminophen (VICODIN) 5-300 MG TABS Take 1 tablet by mouth 4 (four) times daily as needed (PAIN). 40 each 0  . metFORMIN (GLUCOPHAGE) 500 MG tablet Take 1,000 mg by mouth 2 (two) times daily with a meal.     . ondansetron (ZOFRAN) 4 MG tablet Take 1 tablet (4 mg total) by mouth every 8 (eight) hours as needed for nausea or vomiting. 12 tablet 0   No current facility-administered medications on file prior to visit.    Review of Systems:  As per HPI- otherwise negative.  Physical Examination: Filed Vitals:   10/09/14 1503  Pulse: 76  Temp: 98.9 F (37.2 C)  Resp: 20   Filed Vitals:   10/09/14 1503  Height: 5\' 2"  (1.575 m)  Weight: 271 lb 2 oz (122.981 kg)   Body mass index is 49.58 kg/(m^2). Ideal Body Weight: Weight in (lb) to have BMI = 25: 136.4  GEN: WDWN, NAD, Non-toxic, A & O x 3, severe obesity  HEENT: Atraumatic, Normocephalic. Neck supple. No masses, No LAD. Ears and Nose: No external deformity. CV: RRR, No M/G/R. No JVD. No thrill. No extra heart sounds. PULM: CTA B, no wheezes, crackles, rhonchi. No retractions. No resp. distress. No accessory muscle use. ABD: S, NT, ND EXTR: No c/c/e NEURO Normal gait.   No evidence of neurological deficit PSYCH: Normally interactive. Conversant. Not  depressed or anxious appearing.  Calm demeanor.  Bandages on both forearms.  Minimal swelling in the right hand and mild swelling on the left.  Normal cap refill and sensation of fingers bilaterally   Results for orders placed or performed in visit on 10/09/14  POCT CBC  Result Value Ref Range   WBC 9.9 4.6 - 10.2 K/uL   Lymph, poc 3.5 (A) 0.6 - 3.4   POC LYMPH PERCENT 35.6 10 - 50 %L   MID (cbc) 0.7 0 - 0.9   POC MID % 6.7 0 - 12 %M   POC Granulocyte 5.7 2 - 6.9   Granulocyte percent 57.7 37 - 80 %G   RBC 5.17 4.04 - 5.48 M/uL   Hemoglobin 14.2 12.2 - 16.2 g/dL   HCT, POC 43.8 37.7 - 47.9 %   MCV 84.7 80 - 97 fL   MCH, POC 27.5 27 - 31.2 pg   MCHC 32.4 31.8 - 35.4 g/dL   RDW, POC 13.1 %   Platelet Count, POC 358 142 - 424 K/uL   MPV 7.6 0 - 99.8 fL    Assessment and Plan: Pain in wrist, unspecified laterality - Plan: oxyCODONE-acetaminophen (ROXICET) 5-325 MG per tablet, DISCONTINUED: Hydrocodone-Acetaminophen (VICODIN) 5-300 MG TABS  Recent major surgery - Plan: POCT CBC, Basic metabolic panel  Recent surgery of both wrists.  Loosened her bandages and she felt more comfortable.  Plan follow-up with Dr. Caralyn Guile next week and gave her a percocet rx on paper to fill. Cautioned regarding sedation. She will let us know if she needs anything else.    Signed Lamar Blinks, MD

## 2014-10-09 NOTE — Patient Instructions (Signed)
Try the percocet as needed for pain.  Be sure to follow-up with your surgeon next week You can use the percocet as needed for pain.  You can loosen up your bandage if needed

## 2014-10-10 LAB — BASIC METABOLIC PANEL
BUN: 11 mg/dL (ref 6–23)
CO2: 23 meq/L (ref 19–32)
Calcium: 9.4 mg/dL (ref 8.4–10.5)
Chloride: 101 mEq/L (ref 96–112)
Creat: 0.51 mg/dL (ref 0.50–1.10)
Glucose, Bld: 105 mg/dL — ABNORMAL HIGH (ref 70–99)
POTASSIUM: 3.9 meq/L (ref 3.5–5.3)
SODIUM: 135 meq/L (ref 135–145)

## 2014-10-11 ENCOUNTER — Encounter: Payer: Self-pay | Admitting: Family Medicine

## 2014-10-18 ENCOUNTER — Encounter: Payer: Self-pay | Admitting: Family Medicine

## 2014-10-25 ENCOUNTER — Ambulatory Visit: Payer: BC Managed Care – PPO | Admitting: *Deleted

## 2014-10-26 ENCOUNTER — Ambulatory Visit (INDEPENDENT_AMBULATORY_CARE_PROVIDER_SITE_OTHER): Payer: BC Managed Care – PPO | Admitting: Emergency Medicine

## 2014-10-26 VITALS — BP 138/88 | HR 102 | Temp 98.2°F | Resp 18 | Ht 62.0 in | Wt 270.4 lb

## 2014-10-26 DIAGNOSIS — N3001 Acute cystitis with hematuria: Secondary | ICD-10-CM

## 2014-10-26 DIAGNOSIS — R350 Frequency of micturition: Secondary | ICD-10-CM

## 2014-10-26 LAB — POCT URINALYSIS DIPSTICK
Bilirubin, UA: NEGATIVE
Glucose, UA: 500
Ketones, UA: NEGATIVE
Nitrite, UA: NEGATIVE
Protein, UA: 100
SPEC GRAV UA: 1.025
UROBILINOGEN UA: 0.2
pH, UA: 5

## 2014-10-26 LAB — POCT UA - MICROSCOPIC ONLY
CASTS, UR, LPF, POC: NEGATIVE
Crystals, Ur, HPF, POC: NEGATIVE
Mucus, UA: POSITIVE
Yeast, UA: NEGATIVE

## 2014-10-26 MED ORDER — CIPROFLOXACIN HCL 500 MG PO TABS: 500.0000 mg | ORAL_TABLET | Freq: Two times a day (BID) | ORAL | Status: DC

## 2014-10-26 MED ORDER — PHENAZOPYRIDINE HCL 200 MG PO TABS: 200.0000 mg | ORAL_TABLET | Freq: Three times a day (TID) | ORAL | Status: DC | PRN

## 2014-10-26 NOTE — Progress Notes (Signed)
[facility] [address], [place] [address] [phone]  Date:  10/26/2014   Name:  [name]   DOB:  Apr 09, 1976   MRN:  [id number]  PCP:  Ruben Reason, MD    Chief Complaint: Urinary Frequency   History of Present Illness:  [name] is a 38 y.o. very pleasant female patient who presents with the following:  Dysuria, urgency and frequency.  Now has hematuria. No fever or chills.  No nausea or vomiting. No stool change. No rash  No improvement with over the counter medications or other home remedies.  Denies other complaint or health concern today.   Patient Active Problem List   Diagnosis Date Noted  . Severe obesity (BMI >= 40) 09/14/2014  . Irregular menstrual cycle 11/12/2013  . Type II diabetes mellitus, uncontrolled 06/20/2009  . POLYCYSTIC OVARIAN DISEASE 06/20/2009  . CARPAL TUNNEL SYNDROME 06/20/2009    Past Medical History  Diagnosis Date  . H. pylori infection     dx  09-02-2014-- treated w/ antibiotic  . GERD (gastroesophageal reflux disease)   . Type 2 diabetes mellitus   . Carpal tunnel syndrome, bilateral   . PCOS (polycystic ovarian syndrome)   . Menorrhagia   . Irregular menstrual cycle     Past Surgical History  Procedure Laterality Date  . Cesarean section  1995  . Carpal tunnel release Bilateral 10/07/2014    Procedure: CARPAL TUNNEL RELEASE BILATERAL;  Surgeon: [name], MD;  Location: [facility];  Service: Orthopedics;  Laterality: Bilateral;    History  Substance Use Topics  . Smoking status: Never Smoker   . Smokeless tobacco: Never Used  . Alcohol Use: 0.0 oz/week    0 Not specified per week     Comment: socially    Family History  Problem Relation Age of Onset  . Heart disease Mother   . Diabetes Mother   . Hypertension Mother   . Cancer Father     Allergies  Allergen Reactions  . Peach [Prunus Persica] Anaphylaxis    Medication list has been  reviewed and updated.  Current Outpatient Prescriptions on File Prior to Visit  Medication Sig Dispense Refill  . glimepiride (AMARYL) 4 MG tablet Take 2 tablets (8 mg total) by mouth daily before breakfast. 60 tablet 1  . metFORMIN (GLUCOPHAGE) 500 MG tablet Take 1,000 mg by mouth 2 (two) times daily with a meal.     . ondansetron (ZOFRAN) 4 MG tablet Take 1 tablet (4 mg total) by mouth every 8 (eight) hours as needed for nausea or vomiting. 12 tablet 0  . oxyCODONE-acetaminophen (ROXICET) 5-325 MG per tablet Take 1 tablet by mouth every 8 (eight) hours as needed for severe pain. 20 tablet 0   No current facility-administered medications on file prior to visit.    Review of Systems:  As per HPI, otherwise negative.    Physical Examination: Filed Vitals:   10/26/14 0810  BP: 138/88  Pulse: 102  Temp: 98.2 F (36.8 C)  Resp: 18   Filed Vitals:   10/26/14 0810  Height: 5\' 2"  (1.575 m)  Weight: 270 lb 6.4 oz (122.653 kg)   Body mass index is 49.44 kg/(m^2). Ideal Body Weight: Weight in (lb) to have BMI = 25: 136.4  GEN: WDWN, NAD, Non-toxic, A & O x 3 HEENT: Atraumatic, Normocephalic. Neck supple. No masses, No LAD. Ears and Nose: No external deformity. CV: RRR, No M/G/R. No JVD. No thrill.  No extra heart sounds. PULM: CTA B, no wheezes, crackles, rhonchi. No retractions. No resp. distress. No accessory muscle use. ABD: S, NT, ND, +BS. No rebound. No HSM. EXTR: No c/c/e NEURO Normal gait.  PSYCH: Normally interactive. Conversant. Not depressed or anxious appearing.  Calm demeanor.    Assessment and Plan: Cystitis Hematuria cipro Pyridium  Signed,  Ellison Carwin, MD  Results for orders placed or performed in visit on 10/26/14  POCT UA - Microscopic Only  Result Value Ref Range   WBC, Ur, HPF, POC TNTC    RBC, urine, microscopic TNTC    Bacteria, U Microscopic 4+    Mucus, UA Pos    Epithelial cells, urine per micros 0-5    Crystals, Ur, HPF, POC neg     Casts, Ur, LPF, POC neg    Yeast, UA neg   POCT urinalysis dipstick  Result Value Ref Range   Color, UA brown    Clarity, UA turbid    Glucose, UA 500    Bilirubin, UA neg    Ketones, UA neg    Spec Grav, UA 1.025    Blood, UA large    pH, UA 5.0    Protein, UA 100    Urobilinogen, UA 0.2    Nitrite, UA neg    Leukocytes, UA small (1+)

## 2014-10-26 NOTE — Patient Instructions (Signed)
Infección urinaria  °(Urinary Tract Infection) ° La infección urinaria puede ocurrir en cualquier lugar del tracto urinario. El tracto urinario es un sistema de drenaje del cuerpo por el que se eliminan los desechos y el exceso de agua. El tracto urinario está formado por dos riñones, dos uréteres, la vejiga y la uretra. Los riñones son órganos que tienen forma de frijol. Cada riñón tiene aproximadamente el tamaño del puño. Están situados debajo de las costillas, uno a cada lado de la columna vertebral °CAUSAS  °La causa de la infección son los microbios, que son organismos microscópicos, que incluyen hongos, virus, y bacterias. Estos organismos son tan pequeños que sólo pueden verse a través del microscopio. Las bacterias son los microorganismos que más comúnmente causan infecciones urinarias.  °SÍNTOMAS  °Los síntomas pueden variar según la edad y el sexo del paciente y por la ubicación de la infección. Los síntomas en las mujeres jóvenes incluyen la necesidad frecuente e intensa de orinar y una sensación dolorosa de ardor en la vejiga o en la uretra durante la micción. Las mujeres y los hombres mayores podrán sentir cansancio, temblores y debilidad y sentir dolores musculares y dolor abdominal. Si tiene fiebre, puede significar que la infección está en los riñones. Otros síntomas son dolor en la espalda o en los lados debajo de las costillas, náuseas y vómitos.  °DIAGNÓSTICO  °Para diagnosticar una infección urinaria, el médico le preguntará acerca de sus síntomas. También le solicitará una muestra de orina. La muestra de orina se analiza para detectar bacterias y glóbulos blancos de la sangre. Los glóbulos blancos se forman en el organismo para ayudar a combatir las infecciones.  °TRATAMIENTO  °Por lo general, las infecciones urinarias pueden tratarse con medicamentos. Debido a que la mayoría de las infecciones son causadas por bacterias, por lo general pueden tratarse con antibióticos. La elección del  antibiótico y la duración del tratamiento dependerá de sus síntomas y el tipo de bacteria causante de la infección.  °INSTRUCCIONES PARA EL CUIDADO EN EL HOGAR  °· Si le recetaron antibióticos, tómelos exactamente como su médico le indique. Termine el medicamento aunque se sienta mejor después de haber tomado sólo algunos. °· Beba gran cantidad de líquido para mantener la orina de tono claro o color amarillo pálido. °· Evite la cafeína, el té y las bebidas gaseosas. Estas sustancias irritan la vejiga. °· Vaciar la vejiga con frecuencia. Evite retener la orina durante largos períodos. °· Vacíe la vejiga antes y después de tener relaciones sexuales. °· Después de mover el intestino, las mujeres deben higienizarse la región perineal desde adelante hacia atrás. Use sólo un papel tissue por vez. °SOLICITE ATENCIÓN MÉDICA SI:  °· Siente dolor en la espalda. °· Le sube la fiebre. °· Los síntomas no mejoran luego de 3 días. °SOLICITE ATENCIÓN MÉDICA DE INMEDIATO SI:  °· Siente dolor intenso en la espalda o en la zona inferior del abdomen. °· Comienza a sentir escalofríos. °· Tiene náuseas o vómitos. °· Tiene una sensación continua de quemazón o molestias al orinar. °ASEGÚRESE DE QUE:  °· Comprende estas instrucciones. °· Controlará su enfermedad. °· Solicitará ayuda de inmediato si no mejora o empeora. °Document Released: 07/25/2005 Document Revised: 07/09/2012 °ExitCare® Patient Information ©2015 [employer]. This information is not intended to replace advice given to you by your health care provider. Make sure you discuss any questions you have with your health care provider. ° °

## 2014-10-29 DIAGNOSIS — Z8639 Personal history of other endocrine, nutritional and metabolic disease: Secondary | ICD-10-CM

## 2014-10-29 HISTORY — DX: Personal history of other endocrine, nutritional and metabolic disease: Z86.39

## 2014-11-09 ENCOUNTER — Other Ambulatory Visit: Payer: Self-pay | Admitting: Family Medicine

## 2014-11-17 ENCOUNTER — Ambulatory Visit: Payer: BC Managed Care – PPO | Admitting: Internal Medicine

## 2015-01-15 ENCOUNTER — Ambulatory Visit (INDEPENDENT_AMBULATORY_CARE_PROVIDER_SITE_OTHER): Payer: BLUE CROSS/BLUE SHIELD | Admitting: Physician Assistant

## 2015-01-15 VITALS — BP 140/80 | HR 86 | Temp 98.0°F | Ht 62.0 in | Wt 271.4 lb

## 2015-01-15 DIAGNOSIS — E119 Type 2 diabetes mellitus without complications: Secondary | ICD-10-CM

## 2015-01-15 DIAGNOSIS — L03114 Cellulitis of left upper limb: Secondary | ICD-10-CM | POA: Diagnosis not present

## 2015-01-15 DIAGNOSIS — L309 Dermatitis, unspecified: Secondary | ICD-10-CM

## 2015-01-15 DIAGNOSIS — M25522 Pain in left elbow: Secondary | ICD-10-CM

## 2015-01-15 DIAGNOSIS — J309 Allergic rhinitis, unspecified: Secondary | ICD-10-CM

## 2015-01-15 LAB — POCT CBC
Granulocyte percent: 56.5 %G (ref 37–80)
HCT, POC: 47.5 % (ref 37.7–47.9)
Hemoglobin: 15 g/dL (ref 12.2–16.2)
Lymph, poc: 2.9 (ref 0.6–3.4)
MCH, POC: 27.6 pg (ref 27–31.2)
MCHC: 31.7 g/dL — AB (ref 31.8–35.4)
MCV: 87.1 fL (ref 80–97)
MID (CBC): 0.6 (ref 0–0.9)
MPV: 7.5 fL (ref 0–99.8)
PLATELET COUNT, POC: 311 10*3/uL (ref 142–424)
POC Granulocyte: 4.6 (ref 2–6.9)
POC LYMPH PERCENT: 35.9 %L (ref 10–50)
POC MID %: 7.6 %M (ref 0–12)
RBC: 5.45 M/uL (ref 4.04–5.48)
RDW, POC: 13.5 %
WBC: 8.1 10*3/uL (ref 4.6–10.2)

## 2015-01-15 LAB — POCT URINE PREGNANCY: Preg Test, Ur: NEGATIVE

## 2015-01-15 MED ORDER — LORATADINE 10 MG PO TABS: 10.0000 mg | ORAL_TABLET | Freq: Every day | ORAL | Status: DC

## 2015-01-15 MED ORDER — DOXYCYCLINE HYCLATE 100 MG PO CAPS: 100.0000 mg | ORAL_CAPSULE | Freq: Two times a day (BID) | ORAL | Status: AC

## 2015-01-15 MED ORDER — GLIMEPIRIDE 4 MG PO TABS: ORAL_TABLET | ORAL | Status: DC

## 2015-01-15 MED ORDER — METFORMIN HCL 500 MG PO TABS: ORAL_TABLET | ORAL | Status: DC

## 2015-01-15 MED ORDER — FLUTICASONE PROPIONATE 50 MCG/ACT NA SUSP: 2.0000 | Freq: Every day | NASAL | Status: DC

## 2015-01-15 NOTE — Patient Instructions (Signed)
Try claritin for your allergies - may work better than zyrtec. This will also help your itching. Also start using flonase nasal spray once a day for your allergies. Put vaseline in your nose with a q-tip to stop the bleeding. Take doxycycline twice a day for 10 days for your elbow. Apply warm compresses. Make appt with Dr. Tamala Julian about bypass surgery. Return if not getting better in 1 week.

## 2015-01-15 NOTE — Progress Notes (Signed)
Subjective:    Patient ID: [name], female    DOB: December 12, 1975, 39 y.o.   MRN: [id number]  HPI  This is a 39 year old female with PMH DM and PCOS who is presenting with several complaints today: URI symptoms, elbow pain, and pruritis.   Left elbow pain: onset 1 week ago. Experiencing warmth and swelling. States she felt a "ball" in her elbow initially. She states it has gotten bigger and bigger. Now very painful to the touch. Never happened before. Takes oxycodone for carpal tunnel - states this helps her elbow pain some but does not take the pain away. No fever or chills.  Pruritis: onset 3 days ago. Located to upper arms and over knees. No lesions. Has allergies to pollen. Doesn't have sensitive skin or a history of eczema. No one at home with similar symptoms. Hasn't tried anything.  URI symptoms: onset 2 days ago. Has environmental allergies. Having congestion, sneezing and itchy throat. There is blood in her nasal discharge. Denies fever, chills, sore throat, otalgia, cough. Has tried zyrtec and states it does not work for her. She has never tried a nasal spray. States her sister in [place] was sending her an allergy pill and it helped her a lot.  At the end of the visit, pt states she needed refills of DM pills. Last A1C 3 months ago 7.4. She is also mentioned wanting to discuss bypass surgery with Dr. Tamala Julian.  Review of Systems  Constitutional: Negative for fever and chills.  HENT: Positive for congestion and sneezing. Negative for ear pain, sinus pressure and sore throat.   Eyes: Negative for redness.  Respiratory: Negative for cough.   Gastrointestinal: Negative for nausea and vomiting.  Musculoskeletal: Positive for joint swelling.  Skin: Positive for color change. Negative for wound.  Allergic/Immunologic: Positive for environmental allergies.  Hematological: Negative for adenopathy.    Patient Active Problem List   Diagnosis Date Noted  . Severe obesity (BMI  >= 40) 09/14/2014  . Irregular menstrual cycle 11/12/2013  . Type II diabetes mellitus, uncontrolled 06/20/2009  . POLYCYSTIC OVARIAN DISEASE 06/20/2009  . CARPAL TUNNEL SYNDROME 06/20/2009       Objective:   Physical Exam  Constitutional: She is oriented to person, place, and time. She appears well-developed and well-nourished. No distress.  HENT:  Head: Normocephalic and atraumatic.  Right Ear: Hearing, tympanic membrane, external ear and ear canal normal.  Left Ear: Hearing, tympanic membrane, external ear and ear canal normal.  Mouth/Throat: Uvula is midline and mucous membranes are normal. Posterior oropharyngeal erythema present. No oropharyngeal exudate or posterior oropharyngeal edema.  Nasal mucosa dry with scattered scabbing  Eyes: Conjunctivae and lids are normal. Right eye exhibits no discharge. Left eye exhibits no discharge. No scleral icterus.  Cardiovascular: Normal rate, regular rhythm, normal heart sounds, intact distal pulses and normal pulses.   No murmur heard. Pulmonary/Chest: Effort normal and breath sounds normal. No respiratory distress. She has no wheezes. She has no rhonchi. She has no rales.  Musculoskeletal: Normal range of motion.  Lymphadenopathy:       Head (right side): No submental, no submandibular and no tonsillar adenopathy present.       Head (left side): No submental, no submandibular and no tonsillar adenopathy present.    She has no cervical adenopathy.  Neurological: She is alert and oriented to person, place, and time.  Skin: Skin is warm, dry and intact.  Erythema and warmth over left elbow with a central 2x2  cm induration without fluctuance.  Excoriations over the right upper arm, otherwise no lesions noted.  Psychiatric: She has a normal mood and affect. Her speech is normal and behavior is normal. Thought content normal.   Results for orders placed or performed in visit on 01/15/15  POCT CBC  Result Value Ref Range   WBC 8.1 4.6 -  10.2 K/uL   Lymph, poc 2.9 0.6 - 3.4   POC LYMPH PERCENT 35.9 10 - 50 %L   MID (cbc) 0.6 0 - 0.9   POC MID % 7.6 0 - 12 %M   POC Granulocyte 4.6 2 - 6.9   Granulocyte percent 56.5 37 - 80 %G   RBC 5.45 4.04 - 5.48 M/uL   Hemoglobin 15.0 12.2 - 16.2 g/dL   HCT, POC 47.5 37.7 - 47.9 %   MCV 87.1 80 - 97 fL   MCH, POC 27.6 27 - 31.2 pg   MCHC 31.7 (A) 31.8 - 35.4 g/dL   RDW, POC 13.5 %   Platelet Count, POC 311 142 - 424 K/uL   MPV 7.5 0 - 99.8 fL  POCT urine pregnancy  Result Value Ref Range   Preg Test, Ur Negative       Assessment & Plan:  1. Elbow pain, left 2. Cellulitis of left upper extremity. CBC wnl. There is induration present but no fluctuance noted. Doxycycline and warm compresses for cellulitis. Erythema was demarcated. She will return if not getting significantly better in 3 days. - POCT CBC - POCT urine pregnancy - doxycycline (VIBRAMYCIN) 100 MG capsule; Take 1 capsule (100 mg total) by mouth 2 (two) times daily.  Dispense: 20 capsule; Refill: 0  4. Allergic rhinitis, unspecified allergic rhinitis type 4. Dermatitis Pt's current symptoms likely d/t allergies. She has tried zyrtec in the past without relief. She has never tried a nasal spray. She will try flonase and claritin. Claritin may also help with pruritis. Advised putting vaseline in nose to alleviate dryness. - fluticasone (FLONASE) 50 MCG/ACT nasal spray; Place 2 sprays into both nostrils daily.  Dispense: 16 g; Refill: 12 - loratadine (CLARITIN) 10 MG tablet; Take 1 tablet (10 mg total) by mouth daily.  Dispense: 30 tablet; Refill: 11  5. Type 2 diabetes mellitus without complication Refilled medications. Last A1C 7.4 three months ago. Did not get labs today as she mentioned needing refills as walking out the exam room. She will return for recheck with Dr. Tamala Julian in 2 months for lab testing and to discuss bypass surgery. - metFORMIN (GLUCOPHAGE) 500 MG tablet; Take 2 tabs by mouth (1000 mg ) 2 times daily.   Dispense: 120 tablet; Refill: 3 - glimepiride (AMARYL) 4 MG tablet; Take 2 tabs (8 mg) po twice daily.  Dispense: 120 tablet; Refill: 3   Nicole V. Drenda Freeze, MHS [facility]  01/15/2015

## 2015-02-08 ENCOUNTER — Ambulatory Visit (INDEPENDENT_AMBULATORY_CARE_PROVIDER_SITE_OTHER): Payer: BLUE CROSS/BLUE SHIELD | Admitting: Emergency Medicine

## 2015-02-08 VITALS — BP 170/104 | HR 78 | Temp 97.9°F | Resp 16 | Ht 62.0 in | Wt 278.0 lb

## 2015-02-08 DIAGNOSIS — M7022 Olecranon bursitis, left elbow: Secondary | ICD-10-CM

## 2015-02-08 MED ORDER — LISINOPRIL 20 MG PO TABS: 20.0000 mg | ORAL_TABLET | Freq: Every day | ORAL | Status: DC

## 2015-02-08 MED ORDER — NAPROXEN SODIUM 550 MG PO TABS: 550.0000 mg | ORAL_TABLET | Freq: Two times a day (BID) | ORAL | Status: DC

## 2015-02-08 NOTE — Patient Instructions (Signed)
Hipertensin (Hypertension) La hipertensin, conocida comnmente como presin arterial alta, se produce cuando la sangre bombea en las arterias con mucha fuerza. Las arterias son los vasos sanguneos que transportan la sangre desde el corazn hacia todas las partes del cuerpo. Una lectura de la presin arterial consiste en un nmero ms alto sobre un nmero ms bajo, por ejemplo, 110/72. El nmero ms alto (presin sistlica) corresponde a la presin interna de las arterias cuando el corazn [place]. El nmero ms bajo (presin diastlica) corresponde a la presin interna de las arterias cuando el corazn se relaja. En condiciones ideales, la presin arterial debe ser inferior a 120/80. La hipertensin fuerza al corazn a trabajar ms para Herbalist. Las arterias pueden estrecharse o ponerse rgidas. La hipertensin conlleva el riesgo de enfermedad cardaca, ictus y otros problemas.  [place] de riesgo de hipertensin son controlables, pero otros no lo son.  [employer] factores de riesgo que usted no puede Chief Technology Officer, se incluyen:   Manufacturing systems engineer. El riesgo es mayor para las Retail banker.  La edad. Los riesgos aumentan con la edad.  El sexo. Antes de los 45aos, los hombres corren ms [employer]. Despus de los 65aos, las mujeres corren ms [employer]. Entre los factores de riesgo que usted puede Chief Technology Officer, se incluyen:  No hacer la cantidad suficiente de actividad fsica o ejercicio.  Tener sobrepeso.  Consumir mucha grasa, azcar, caloras o sal en la dieta.  Beber alcohol en exceso. SIGNOS Y SNTOMAS Por lo general, la hipertensin no causa signos o sntomas. La hipertensin demasiado alta (crisis hipertensiva) puede causar dolor de cabeza, ansiedad, falta de aire y hemorragia nasal. DIAGNSTICO  Para detectar si usted tiene hipertensin, el mdico le medir la presin arterial mientras est sentado, con el brazo  levantado a la altura del corazn. Debe medirla al [facility] veces en el mismo brazo. Determinadas condiciones pueden causar una diferencia de presin arterial entre el brazo izquierdo y Insurance underwriter. El hecho de tener una sola lectura de la presin arterial ms alta que lo normal no significa que Stage manager. En el caso de tener una lectura de la presin arterial con un valor alto, pdale al mdico que la verifique nuevamente. [place] hipertensin arterial incluye hacer cambios en el estilo de vida y, posiblemente, tomar medicamentos. Un estilo de vida saludable puede ayudar a bajar la presin arterial alta. Quiz deba cambiar algunos hbitos. Los [employer] en el estilo de vida pueden incluir:  Seguir la dieta DASH. Esta dieta tiene un alto contenido de frutas, verduras y Psychologist, prison and probation services. Incluye poca cantidad de sal, carnes rojas y azcares agregados.  Hacer al menos 2horas de actividad fsica enrgica todas las semanas.  Perder peso, si es necesario.  No fumar.  Limitar el consumo de bebidas alcohlicas.  Aprender formas de reducir el estrs. Si los cambios en el estilo de vida no son suficientes para Child psychotherapist la presin arterial, el mdico puede recetarle medicamentos. Quiz necesite tomar ms de uno. Trabaje en conjunto con su mdico para comprender los riesgos y los beneficios. INSTRUCCIONES PARA EL CUIDADO EN EL HOGAR  Haga que le midan de nuevo la presin arterial segn las indicaciones del [place] los medicamentos solamente como se lo haya indicado el mdico. Siga cuidadosamente las indicaciones. Los medicamentos para la presin arterial deben tomarse segn las indicaciones. Los medicamentos pierden eficacia al omitir las dosis. El hecho de omitir  las dosis tambin aumenta el riesgo de otros [place].  No fume.  Contrlese la presin arterial en su casa segn las indicaciones del mdico. SOLICITE ATENCIN MDICA SI:   Piensa  que tiene una reaccin alrgica a los medicamentos.  Tiene mareos o dolores de cabeza con Scientist, research (physical sciences).  Tiene hinchazn en los tobillos.  Tiene problemas de visin. SOLICITE ATENCIN MDICA DE INMEDIATO SI:  Siente un dolor de cabeza intenso o confusin.  Siente debilidad inusual, adormecimiento o que Geneticist, molecular.  Siente dolor intenso en el pecho o en el abdomen.  Vomita repetidas veces.  Tiene dificultad para respirar. ASEGRESE DE QUE:   Comprende estas instrucciones.  Controlar su afeccin.  Recibir ayuda de inmediato si no mejora o si empeora. Document Released: 10/15/2005 Document Revised: 03/01/2014 [facility] Patient Information 2015 [place], [place]. This information is not intended to replace advice given to you by your health care provider. Make sure you discuss any questions you have with your health care provider. Bursitis del olcranon (Olecranon Bursitis) La bursitis es la hinchazn y Conservation officer, historic buildings (inflamacin) del saco relleno de lquido (bursa) que recubre y Charity fundraiser. La bursitis olecraneana ocurre en el codo.  CAUSAS La bursitis puede originarse en una herida, en el uso excesivo de la articulacin, artritis o infeccin. SNTOMAS  Sensibilidad, hinchazn, calor y enrojecimiento en el codo.  Dolor en el codo al realizar movimientos. El dolor aumenta al doblarlo.  Sonido de chirrido cuando la bursa se   frota o se mueve.  Aumento de tamao de la bursa sin dolor o incomodidad.  Cristy Hilts con aumento del dolor y la inflamacin de la bursa, si se infecta. INSTRUCCIONES PARA EL CUIDADO DOMICILIARIO  Aplique hielo sobre la zona afectada.  Ponga el hielo en una bolsa plstica.  Colquese una toalla entre la piel y la bolsa de hielo.  Deje el hielo durante 15 a 20 minutos, cada hora mientras se encuentre despierto. Hgalo [employer].  Cuando haga reposo, eleve el codo por [place] del nivel del corazn. Esto ayuda a Arts administrator.  Lleve la articulacin a un rango de movimiento completo 4 veces al da. En otros momentos haga reposar la articulacin lesionada. Cuando el dolor disminuya, comience lentamente con los movimientos y las actividades normales.  Slo tome medicamentos de venta libre o los que le prescriba su mdico para Best boy, el malestar o la fiebre, segn las indicaciones.  Reduzca la ingesta de [place] y de productos lcteos (queso, yogurt). Esto puede empeorar el problema. SOLICITE ATENCIN MDICA DE INMEDIATO SI:  El dolor aumenta an Optician, dispensing.  Tiene fiebre.  Siente calor e inflamacin en la bursa y el codo.  Una lnea roja sube por su brazo.  Presenta dolor al mover el codo. ASEGRESE QUE:   Comprende estas instrucciones.  Controlar su enfermedad.  Solicitar ayuda de inmediato si no mejora o si empeora. Document Released: 01/31/2009 Document Revised: 01/07/2012 [facility] Patient Information 2015 [place]. This information is not intended to replace advice given to you by your health care provider. Make sure you discuss any questions you have with your health care provider.

## 2015-02-08 NOTE — Progress Notes (Signed)
[facility] [address] [place]  [address] [phone]  Date:  02/08/2015   Name:  [name]   DOB:  1976/03/02   MRN:  [id number]  PCP:  Ruben Reason, MD    Chief Complaint: Follow-up and Back Pain   History of Present Illness:  [name] is a 39 y.o. very pleasant female patient who presents with the following:  Patient seen with what sounds like a cellulitis of elbow. Now to office with pain in the elbow.  No swelling or redness No fever or chills. Pain increases when rest elbow on table. No antecedent injury No improvement with over the counter medications or other home remedies.  Denies other complaint or health concern today.   Patient Active Problem List   Diagnosis Date Noted  . Severe obesity (BMI >= 40) 09/14/2014  . Irregular menstrual cycle 11/12/2013  . Type II diabetes mellitus, uncontrolled 06/20/2009  . POLYCYSTIC OVARIAN DISEASE 06/20/2009  . CARPAL TUNNEL SYNDROME 06/20/2009    Past Medical History  Diagnosis Date  . H. pylori infection     dx  09-02-2014-- treated w/ antibiotic  . GERD (gastroesophageal reflux disease)   . Type 2 diabetes mellitus   . Carpal tunnel syndrome, bilateral   . PCOS (polycystic ovarian syndrome)   . Menorrhagia   . Irregular menstrual cycle     Past Surgical History  Procedure Laterality Date  . Cesarean section  1995  . Carpal tunnel release Bilateral 10/07/2014    Procedure: CARPAL TUNNEL RELEASE BILATERAL;  Surgeon: [name], MD;  Location: [facility];  Service: Orthopedics;  Laterality: Bilateral;    History  Substance Use Topics  . Smoking status: Never Smoker   . Smokeless tobacco: Never Used  . Alcohol Use: 0.0 oz/week    0 Standard drinks or equivalent per week     Comment: socially    Family History  Problem Relation Age of Onset  . Heart disease Mother   . Diabetes Mother   . Hypertension Mother   . Cancer Father     Allergies   Allergen Reactions  . Peach [Prunus Persica] Anaphylaxis    Medication list has been reviewed and updated.  Current Outpatient Prescriptions on File Prior to Visit  Medication Sig Dispense Refill  . fluticasone (FLONASE) 50 MCG/ACT nasal spray Place 2 sprays into both nostrils daily. 16 g 12  . glimepiride (AMARYL) 4 MG tablet Take 2 tabs (8 mg) po twice daily. 120 tablet 3  . loratadine (CLARITIN) 10 MG tablet Take 1 tablet (10 mg total) by mouth daily. 30 tablet 11  . metFORMIN (GLUCOPHAGE) 500 MG tablet Take 2 tabs by mouth (1000 mg ) 2 times daily. 120 tablet 3  . oxyCODONE-acetaminophen (ROXICET) 5-325 MG per tablet Take 1 tablet by mouth every 8 (eight) hours as needed for severe pain. 20 tablet 0   No current facility-administered medications on file prior to visit.    Review of Systems:  As per HPI, otherwise negative.    Physical Examination: Filed Vitals:   02/08/15 1023  BP: 170/104  Pulse: 78  Temp: 97.9 F (36.6 C)  Resp: 16   Filed Vitals:   02/08/15 1023  Height: 5\' 2"  (1.575 m)  Weight: 278 lb (126.1 kg)   Body mass index is 50.83 kg/(m^2). Ideal Body Weight: Weight in (lb) to have BMI = 25: 136.4   GEN: WDWN, NAD, Non-toxic, Alert & Oriented x 3 HEENT:  Atraumatic, Normocephalic.  Ears and Nose: No external deformity. EXTR: No clubbing/cyanosis/edema NEURO: Normal gait.  PSYCH: Normally interactive. Conversant. Not depressed or anxious appearing.  Calm demeanor.  LEFT elbow:  Thickened but mobile olecranon bursa with mild bursal effusion. No cellulitis, warmth or joint effusion.  Full ROM elbow.   Assessment and Plan: Olecranon bursitis Anaprox  Signed,  Ellison Carwin, MD

## 2015-02-13 ENCOUNTER — Telehealth: Payer: Self-pay

## 2015-02-13 NOTE — Telephone Encounter (Signed)
Dr. Ouida Sills, pt says she asked for a RF of abx for cellulitis was here, but didn't see where you sent one. Were you going to do this for her?

## 2015-02-13 NOTE — Telephone Encounter (Signed)
Pt came in today to check on the status of the message she left about the antibiotic. I informed her that we were waiting on Dr. Tonette Bihari response. She was very upset and persistent in saying she needed this rx ASAP. I told her we would be more than happy to see her for an office visit today. She signed in but then decided to walk out once she found out we could not give her an exact waiting time.

## 2015-02-13 NOTE — Telephone Encounter (Signed)
Patient called in stating that she needs a refill on her antiobotic that she got 2 weeks ago because she is still having the same issues. She stated that someone here told her that all she had to do was call in and ask for a refill and they would give it to her. Her call back number is [phone] and she uses the [facility] on [address]

## 2015-02-14 NOTE — Telephone Encounter (Signed)
We do not refill abx without an OV. She saw Dr. Ouida Sills last week for same issue but he did not feel she needed abx. If she feels she has a problem that requires abx - she needs to RTC.

## 2015-02-14 NOTE — Telephone Encounter (Signed)
lmom of below message. 

## 2015-02-14 NOTE — Telephone Encounter (Signed)
If you review the notes, you will see that I did not treat the patient with an antibiotic as she had NO infection when I saw her. Should she feel she needs a "refill" of antibiotics, which we DO NOT do, she must be seen.

## 2015-02-14 NOTE — Telephone Encounter (Signed)
Spoke with pt. She is very upset because she wanted a refill of her doxy. She says she came in to see Dr Ouida Sills last week and wanted to get more abx but apparently he didn't feel like she had an infection to warrant abx. i told pt i would speak with you, but pt is very upset that we won't give her more doxy. i don't know what to do...please advise :)

## 2015-02-16 ENCOUNTER — Encounter: Payer: Self-pay | Admitting: Family Medicine

## 2015-02-16 ENCOUNTER — Ambulatory Visit (INDEPENDENT_AMBULATORY_CARE_PROVIDER_SITE_OTHER): Payer: BLUE CROSS/BLUE SHIELD | Admitting: Family Medicine

## 2015-02-16 VITALS — BP 166/98 | HR 76 | Temp 98.1°F | Resp 16 | Ht 62.5 in | Wt 279.8 lb

## 2015-02-16 DIAGNOSIS — R9431 Abnormal electrocardiogram [ECG] [EKG]: Secondary | ICD-10-CM | POA: Diagnosis not present

## 2015-02-16 DIAGNOSIS — F329 Major depressive disorder, single episode, unspecified: Secondary | ICD-10-CM | POA: Diagnosis not present

## 2015-02-16 DIAGNOSIS — I10 Essential (primary) hypertension: Secondary | ICD-10-CM

## 2015-02-16 DIAGNOSIS — N926 Irregular menstruation, unspecified: Secondary | ICD-10-CM

## 2015-02-16 DIAGNOSIS — R0683 Snoring: Secondary | ICD-10-CM

## 2015-02-16 DIAGNOSIS — E119 Type 2 diabetes mellitus without complications: Secondary | ICD-10-CM | POA: Diagnosis not present

## 2015-02-16 DIAGNOSIS — R3129 Other microscopic hematuria: Secondary | ICD-10-CM

## 2015-02-16 DIAGNOSIS — F32A Depression, unspecified: Secondary | ICD-10-CM

## 2015-02-16 DIAGNOSIS — R312 Other microscopic hematuria: Secondary | ICD-10-CM

## 2015-02-16 LAB — CBC WITH DIFFERENTIAL/PLATELET
BASOS ABS: 0.1 10*3/uL (ref 0.0–0.1)
BASOS PCT: 1 % (ref 0–1)
EOS ABS: 0.3 10*3/uL (ref 0.0–0.7)
Eosinophils Relative: 4 % (ref 0–5)
HCT: 42.8 % (ref 36.0–46.0)
HEMOGLOBIN: 14.3 g/dL (ref 12.0–15.0)
Lymphocytes Relative: 39 % (ref 12–46)
Lymphs Abs: 3.2 10*3/uL (ref 0.7–4.0)
MCH: 28.3 pg (ref 26.0–34.0)
MCHC: 33.4 g/dL (ref 30.0–36.0)
MCV: 84.6 fL (ref 78.0–100.0)
MONO ABS: 0.7 10*3/uL (ref 0.1–1.0)
MPV: 9.7 fL (ref 8.6–12.4)
Monocytes Relative: 9 % (ref 3–12)
NEUTROS PCT: 47 % (ref 43–77)
Neutro Abs: 3.8 10*3/uL (ref 1.7–7.7)
Platelets: 309 10*3/uL (ref 150–400)
RBC: 5.06 MIL/uL (ref 3.87–5.11)
RDW: 13.2 % (ref 11.5–15.5)
WBC: 8.1 10*3/uL (ref 4.0–10.5)

## 2015-02-16 LAB — COMPREHENSIVE METABOLIC PANEL
ALT: 70 U/L — ABNORMAL HIGH (ref 0–35)
AST: 41 U/L — ABNORMAL HIGH (ref 0–37)
Albumin: 4 g/dL (ref 3.5–5.2)
Alkaline Phosphatase: 80 U/L (ref 39–117)
BUN: 12 mg/dL (ref 6–23)
CALCIUM: 8.5 mg/dL (ref 8.4–10.5)
CO2: 27 mEq/L (ref 19–32)
Chloride: 101 mEq/L (ref 96–112)
Creat: 0.55 mg/dL (ref 0.50–1.10)
GLUCOSE: 181 mg/dL — AB (ref 70–99)
POTASSIUM: 3.9 meq/L (ref 3.5–5.3)
Sodium: 137 mEq/L (ref 135–145)
Total Bilirubin: 0.5 mg/dL (ref 0.2–1.2)
Total Protein: 7 g/dL (ref 6.0–8.3)

## 2015-02-16 LAB — POCT URINALYSIS DIPSTICK
Bilirubin, UA: NEGATIVE
Glucose, UA: 1000
KETONES UA: NEGATIVE
Leukocytes, UA: NEGATIVE
NITRITE UA: NEGATIVE
Protein, UA: 30
SPEC GRAV UA: 1.025
UROBILINOGEN UA: 0.2
pH, UA: 5

## 2015-02-16 LAB — POCT UA - MICROSCOPIC ONLY
Casts, Ur, LPF, POC: NEGATIVE
Crystals, Ur, HPF, POC: NEGATIVE
Mucus, UA: POSITIVE
YEAST UA: NEGATIVE

## 2015-02-16 LAB — LIPID PANEL
CHOLESTEROL: 179 mg/dL (ref 0–200)
HDL: 36 mg/dL — AB (ref >=46)
LDL Cholesterol: 101 mg/dL — ABNORMAL HIGH (ref 0–99)
Total CHOL/HDL Ratio: 5 Ratio
Triglycerides: 212 mg/dL — ABNORMAL HIGH (ref <150)
VLDL: 42 mg/dL — ABNORMAL HIGH (ref 0–40)

## 2015-02-16 LAB — HEMOGLOBIN A1C
Hgb A1c MFr Bld: 9 % — ABNORMAL HIGH (ref <5.7)
Mean Plasma Glucose: 212 mg/dL — ABNORMAL HIGH (ref <117)

## 2015-02-16 LAB — TSH: TSH: 0.248 u[IU]/mL — ABNORMAL LOW (ref 0.350–4.500)

## 2015-02-16 LAB — POCT URINE PREGNANCY: Preg Test, Ur: NEGATIVE

## 2015-02-16 MED ORDER — FLUOXETINE HCL 20 MG PO TABS: 20.0000 mg | ORAL_TABLET | Freq: Every day | ORAL | Status: DC

## 2015-02-16 MED ORDER — HYDROCHLOROTHIAZIDE 12.5 MG PO TABS: 12.5000 mg | ORAL_TABLET | Freq: Every day | ORAL | Status: DC

## 2015-02-16 NOTE — Progress Notes (Signed)
Subjective:    Patient ID: [name], female    DOB: 06/30/1976, 39 y.o.   MRN: [id number]  02/16/2015  Obesity; Shortness of Breath; Back Pain; Medication Reaction; and Foot Swelling   HPI This 39 y.o. female presents to establish care and for evaluation of multiple symptoms including SOB, back pain, medication reaction, foot swelling.  Depression/saddness: onset one year ago.  Depressed about weight, about every thing.  No SI.  Excessive crying.  Cannot explain how she feels.    Obesity: trying to lose weight and then gains it back.  Doing her best.  Martin Majestic to Zumba and hurt knees.  Trying to lift things but causes lower back pain.  People comment about weight and causes pt to feel badly.  Interested in gastric bypass.  Not sure if insurance covers gastric bypass.   Eats at 10:30-12:00 in morning.   Eats grilled chicken with rice 1 cup.  Water.   Lays down for 30 minutes and then returns to work. Eats again after 6:00pm.   Bean soup with rice.  Water. Still gaining weight.   Does not eat fast foods.  Drinks some soda zero.  Not interested in nutritionist consultation; has seen nutritionist in past.    2.  HTN: just filled Lisinopril 20mg  daily for past three days.  Feels heart pounding at night; recent elevation of blood pressure.  New diagnosis for patient.  3.  DMII: second medication recently added; no one specifically is managing diabetes for patient.  Interested in continuity of care.  Patient reports good compliance with medication, good tolerance to medication, and good symptom control.  Very upset about having to take two medications. Snores; tired all the time; no previous sleep study.    4. Irregular menses: previously irregular menses; then in past treated for syphilis with PCN in twenties and menses normalized.  Infertility for years.  Now menses have become irregular again.   Review of Systems  Constitutional: Negative for fever, chills, diaphoresis and fatigue.    Eyes: Negative for visual disturbance.  Respiratory: Positive for shortness of breath. Negative for cough.   Cardiovascular: Positive for palpitations and leg swelling. Negative for chest pain.  Gastrointestinal: Negative for nausea, vomiting, abdominal pain, diarrhea and constipation.  Endocrine: Negative for cold intolerance, heat intolerance, polydipsia, polyphagia and polyuria.  Genitourinary: Positive for menstrual problem.  Musculoskeletal: Positive for back pain.  Skin: Negative for color change, pallor, rash and wound.  Neurological: Negative for dizziness, tremors, seizures, syncope, facial asymmetry, speech difficulty, weakness, light-headedness, numbness and headaches.  Psychiatric/Behavioral: Positive for dysphoric mood. Negative for suicidal ideas, sleep disturbance and self-injury.    Past Medical History  Diagnosis Date  . H. pylori infection     dx  09-02-2014-- treated w/ antibiotic  . GERD (gastroesophageal reflux disease)   . Type 2 diabetes mellitus   . Carpal tunnel syndrome, bilateral   . PCOS (polycystic ovarian syndrome)   . Menorrhagia   . Irregular menstrual cycle   . Hypertension    Past Surgical History  Procedure Laterality Date  . Cesarean section  1995  . Carpal tunnel release Bilateral 10/07/2014    Procedure: CARPAL TUNNEL RELEASE BILATERAL;  Surgeon: [name], MD;  Location[facility];  Service: Orthopedics;  Laterality: Bilateral;   Allergies  Allergen Reactions  . Peach [Prunus Persica] Anaphylaxis   Current Outpatient Prescriptions  Medication Sig Dispense Refill  . fluticasone (FLONASE) 50 MCG/ACT nasal spray Place 2 sprays into  both nostrils daily. 16 g 12  . glimepiride (AMARYL) 4 MG tablet Take 2 tabs (8 mg) po twice daily. 120 tablet 3  . lisinopril (PRINIVIL,ZESTRIL) 20 MG tablet Take 1 tablet (20 mg total) by mouth daily. 90 tablet 3  . loratadine (CLARITIN) 10 MG tablet Take 1 tablet (10 mg total) by mouth  daily. 30 tablet 11  . metFORMIN (GLUCOPHAGE) 500 MG tablet Take 2 tabs by mouth (1000 mg ) 2 times daily. 120 tablet 3  . naproxen sodium (ANAPROX DS) 550 MG tablet Take 1 tablet (550 mg total) by mouth 2 (two) times daily with a meal. 40 tablet 0  . oxyCODONE-acetaminophen (ROXICET) 5-325 MG per tablet Take 1 tablet by mouth every 8 (eight) hours as needed for severe pain. 20 tablet 0  . FLUoxetine (PROZAC) 20 MG tablet Take 1 tablet (20 mg total) by mouth daily. 30 tablet 3  . hydrochlorothiazide (HYDRODIURIL) 12.5 MG tablet Take 1 tablet (12.5 mg total) by mouth daily. 30 tablet 2   No current facility-administered medications for this visit.       Objective:    BP 166/98 mmHg  Pulse 76  Temp(Src) 98.1 F (36.7 C) (Oral)  Resp 16  Ht 5' 2.5" (1.588 m)  Wt 279 lb 12.8 oz (126.916 kg)  BMI 50.33 kg/m2  SpO2 98%  LMP 12/31/2014 Physical Exam  Constitutional: She is oriented to person, place, and time. She appears well-developed and well-nourished. No distress.  obese  HENT:  Head: Normocephalic and atraumatic.  Right Ear: External ear normal.  Left Ear: External ear normal.  Nose: Nose normal.  Mouth/Throat: Oropharynx is clear and moist.  Eyes: Conjunctivae and EOM are normal. Pupils are equal, round, and reactive to light.  Neck: Normal range of motion. Neck supple. Carotid bruit is not present. No thyromegaly present.  Cardiovascular: Normal rate, regular rhythm, normal heart sounds and intact distal pulses.  Exam reveals no gallop and no friction rub.   No murmur heard. Pulmonary/Chest: Effort normal and breath sounds normal. She has no wheezes. She has no rales.  Abdominal: Soft. Bowel sounds are normal. She exhibits no distension and no mass. There is no tenderness. There is no rebound and no guarding.  Lymphadenopathy:    She has no cervical adenopathy.  Neurological: She is alert and oriented to person, place, and time. No cranial nerve deficit.  Skin: Skin is warm  and dry. No rash noted. She is not diaphoretic. No erythema. No pallor.  Psychiatric: She has a normal mood and affect. Her behavior is normal.  tearful    EKG: NSR: T wave inversion I, AVL, V4-V6    Assessment & Plan:   1. Type 2 diabetes mellitus without complication   2. Essential hypertension, benign   3. Irregular menses   4. Morbid obesity   5. Depression   6. Snoring   7. Microscopic hematuria   8. Nonspecific abnormal electrocardiogram (ECG) (EKG)     1. DMII: stable; obtain labs; continue current medications.  Follow-up in one month.  S/p nutrition counseling in past. 2.  HTN: New; uncontrolled yet just started Lisinopril three days ago.  Will add HCTZ 12.5mg  daily for new onset swelling.  RTC one month for BP check, BMET.  Abnormal EKG. 3.  Irregular menses: New/recurrent; likely secondary to obesity; obtain labs. 4.  Morbid obesity: recommend pt contact insurance to verify coverage for bariatric surgery. If insurance does cover bariatric surgery, recommend patient attend bariatric seminar at [facility].  Will discuss further at follow-up vsit in one month. 5.  Depression: New.  Rx for Prozac 20mg  daily provided; RTC one month. 6.  Snoring: associated with fatigue, hypersomnolence, obesity, DMII; refer for sleep study. 7. Microscopic hematuria: New. Repeat at next visit.   Meds ordered this encounter  Medications  . FLUoxetine (PROZAC) 20 MG tablet    Sig: Take 1 tablet (20 mg total) by mouth daily.    Dispense:  30 tablet    Refill:  3  . hydrochlorothiazide (HYDRODIURIL) 12.5 MG tablet    Sig: Take 1 tablet (12.5 mg total) by mouth daily.    Dispense:  30 tablet    Refill:  2    Return in about 4 weeks (around 03/16/2015) for recheck blood pressure.     Desiray Orchard Elayne Guerin, M.D. [facility] [address] [employer] [place], [place]  [id number] [phone] phone [phone] fax

## 2015-02-16 NOTE — Patient Instructions (Addendum)
1.  CALL INSURANCE TO SEE IF IT COVERS GASTRIC BYPASS. 2.  IF YOUR INSURANCE DOES COVER GASTRIC BYPASS, THEN ATTEND FREE SEMINAR AT [place]. 3.  DOWNLOAD FREE APP ON YOUR PHONE FOR [email or web address]    Depression Depression refers to feeling sad, low, down in the dumps, blue, gloomy, or empty. In general, there are two kinds of depression: 1. Normal sadness or normal grief. This kind of depression is one that we all feel from time to time after upsetting life experiences, such as the loss of a job or the ending of a relationship. This kind of depression is considered normal, is short lived, and resolves within a few days to 2 weeks. Depression experienced after the loss of a loved one (bereavement) often lasts longer than 2 weeks but normally gets better with time. 2. Clinical depression. This kind of depression lasts longer than normal sadness or normal grief or interferes with your ability to function at home, at work, and in school. It also interferes with your personal relationships. It affects almost every aspect of your life. Clinical depression is an illness. Symptoms of depression can also be caused by conditions other than those mentioned above, such as:  Physical illness. Some physical illnesses, including underactive thyroid gland (hypothyroidism), severe anemia, specific types of cancer, diabetes, uncontrolled seizures, heart and lung problems, strokes, and chronic pain are commonly associated with symptoms of depression.  Side effects of some prescription medicine. In some people, certain types of medicine can cause symptoms of depression.  Substance abuse. Abuse of alcohol and illicit drugs can cause symptoms of depression. SYMPTOMS Symptoms of normal sadness and normal grief include the following:  Feeling sad or crying for short periods of time.  Not caring about anything (apathy).  Difficulty sleeping or sleeping too much.  No longer able to  enjoy the things you used to enjoy.  Desire to be by oneself all the time (social isolation).  Lack of energy or motivation.  Difficulty concentrating or remembering.  Change in appetite or weight.  Restlessness or agitation. Symptoms of clinical depression include the same symptoms of normal sadness or normal grief and also the following symptoms:  Feeling sad or crying all the time.  Feelings of guilt or worthlessness.  Feelings of hopelessness or helplessness.  Thoughts of suicide or the desire to harm yourself (suicidal ideation).  Loss of touch with reality (psychotic symptoms). Seeing or hearing things that are not real (hallucinations) or having false beliefs about your life or the people around you (delusions and paranoia). DIAGNOSIS  The diagnosis of clinical depression is usually based on how bad the symptoms are and how long they have lasted. Your health care provider will also ask you questions about your medical history and substance use to find out if physical illness, use of prescription medicine, or substance abuse is causing your depression. Your health care provider may also order blood tests. TREATMENT  Often, normal sadness and normal grief do not require treatment. However, sometimes antidepressant medicine is given for bereavement to ease the depressive symptoms until they resolve. The treatment for clinical depression depends on how bad the symptoms are but often includes antidepressant medicine, counseling with a mental health professional, or both. Your health care provider will help to determine what treatment is best for you. Depression caused by physical illness usually goes away with appropriate medical treatment of the illness. If prescription medicine is causing depression, talk with your health care provider about stopping  the medicine, decreasing the dose, or changing to another medicine. Depression caused by the abuse of alcohol or illicit drugs goes away  when you stop using these substances. Some adults need professional help in order to stop drinking or using drugs. SEEK IMMEDIATE MEDICAL CARE IF:  You have thoughts about hurting yourself or others.  You lose touch with reality (have psychotic symptoms).  You are taking medicine for depression and have a serious side effect. FOR MORE INFORMATION  National Alliance on Mental Illness: www.nami.[employer] of [employer]: https://carter.com/ Document Released: 10/12/2000 Document Revised: 03/01/2014 Document Reviewed: 01/14/2012 [facility] Patient Information 2015 [place], [place]. This information is not intended to replace advice given to you by your health care provider. Make sure you discuss any questions you have with your health care provider.

## 2015-02-23 ENCOUNTER — Encounter (HOSPITAL_COMMUNITY): Payer: Self-pay | Admitting: Emergency Medicine

## 2015-02-23 ENCOUNTER — Emergency Department (HOSPITAL_COMMUNITY)
Admission: EM | Admit: 2015-02-23 | Discharge: 2015-02-24 | Disposition: A | Discharge status: Home / Self Care | Payer: BLUE CROSS/BLUE SHIELD | Attending: Emergency Medicine | Admitting: Emergency Medicine

## 2015-02-23 DIAGNOSIS — Z79899 Other long term (current) drug therapy: Secondary | ICD-10-CM | POA: Insufficient documentation

## 2015-02-23 DIAGNOSIS — Z8719 Personal history of other diseases of the digestive system: Secondary | ICD-10-CM | POA: Diagnosis not present

## 2015-02-23 DIAGNOSIS — Z7951 Long term (current) use of inhaled steroids: Secondary | ICD-10-CM | POA: Insufficient documentation

## 2015-02-23 DIAGNOSIS — Z8619 Personal history of other infectious and parasitic diseases: Secondary | ICD-10-CM | POA: Insufficient documentation

## 2015-02-23 DIAGNOSIS — R1030 Lower abdominal pain, unspecified: Secondary | ICD-10-CM | POA: Insufficient documentation

## 2015-02-23 DIAGNOSIS — R519 Headache, unspecified: Secondary | ICD-10-CM

## 2015-02-23 DIAGNOSIS — R109 Unspecified abdominal pain: Secondary | ICD-10-CM

## 2015-02-23 DIAGNOSIS — M549 Dorsalgia, unspecified: Secondary | ICD-10-CM | POA: Diagnosis not present

## 2015-02-23 DIAGNOSIS — R42 Dizziness and giddiness: Secondary | ICD-10-CM | POA: Insufficient documentation

## 2015-02-23 DIAGNOSIS — E119 Type 2 diabetes mellitus without complications: Secondary | ICD-10-CM | POA: Diagnosis not present

## 2015-02-23 DIAGNOSIS — R740 Nonspecific elevation of levels of transaminase and lactic acid dehydrogenase [LDH]: Secondary | ICD-10-CM | POA: Insufficient documentation

## 2015-02-23 DIAGNOSIS — I10 Essential (primary) hypertension: Secondary | ICD-10-CM | POA: Diagnosis not present

## 2015-02-23 DIAGNOSIS — R609 Edema, unspecified: Secondary | ICD-10-CM | POA: Insufficient documentation

## 2015-02-23 DIAGNOSIS — Z8742 Personal history of other diseases of the female genital tract: Secondary | ICD-10-CM | POA: Diagnosis not present

## 2015-02-23 DIAGNOSIS — Z791 Long term (current) use of non-steroidal anti-inflammatories (NSAID): Secondary | ICD-10-CM | POA: Diagnosis not present

## 2015-02-23 DIAGNOSIS — R51 Headache: Secondary | ICD-10-CM | POA: Diagnosis not present

## 2015-02-23 DIAGNOSIS — R7401 Elevation of levels of liver transaminase levels: Secondary | ICD-10-CM

## 2015-02-23 DIAGNOSIS — Z3202 Encounter for pregnancy test, result negative: Secondary | ICD-10-CM | POA: Diagnosis not present

## 2015-02-23 DIAGNOSIS — Z8669 Personal history of other diseases of the nervous system and sense organs: Secondary | ICD-10-CM | POA: Insufficient documentation

## 2015-02-23 DIAGNOSIS — R112 Nausea with vomiting, unspecified: Secondary | ICD-10-CM | POA: Diagnosis not present

## 2015-02-23 LAB — COMPREHENSIVE METABOLIC PANEL
ALBUMIN: 3.4 g/dL — AB (ref 3.5–5.2)
ALK PHOS: 90 U/L (ref 39–117)
ALT: 65 U/L — AB (ref 0–35)
AST: 43 U/L — ABNORMAL HIGH (ref 0–37)
Anion gap: 9 (ref 5–15)
BUN: 10 mg/dL (ref 6–23)
CO2: 24 mmol/L (ref 19–32)
Calcium: 8.7 mg/dL (ref 8.4–10.5)
Chloride: 103 mmol/L (ref 96–112)
Creatinine, Ser: 0.56 mg/dL (ref 0.50–1.10)
GFR calc Af Amer: >90 mL/min (ref >90)
GFR calc non Af Amer: >90 mL/min (ref >90)
Glucose, Bld: 136 mg/dL — ABNORMAL HIGH (ref 70–99)
Potassium: 3.5 mmol/L (ref 3.5–5.1)
Sodium: 136 mmol/L (ref 135–145)
TOTAL PROTEIN: 6.8 g/dL (ref 6.0–8.3)
Total Bilirubin: 0.7 mg/dL (ref 0.3–1.2)

## 2015-02-23 LAB — CBC WITH DIFFERENTIAL/PLATELET
BASOS ABS: 0 10*3/uL (ref 0.0–0.1)
Basophils Relative: 0 % (ref 0–1)
Eosinophils Absolute: 0.2 10*3/uL (ref 0.0–0.7)
Eosinophils Relative: 3 % (ref 0–5)
HEMATOCRIT: 41.6 % (ref 36.0–46.0)
Hemoglobin: 14.2 g/dL (ref 12.0–15.0)
Lymphocytes Relative: 31 % (ref 12–46)
Lymphs Abs: 2.5 10*3/uL (ref 0.7–4.0)
MCH: 28.7 pg (ref 26.0–34.0)
MCHC: 34.1 g/dL (ref 30.0–36.0)
MCV: 84 fL (ref 78.0–100.0)
Monocytes Absolute: 0.7 10*3/uL (ref 0.1–1.0)
Monocytes Relative: 8 % (ref 3–12)
Neutro Abs: 4.7 10*3/uL (ref 1.7–7.7)
Neutrophils Relative %: 58 % (ref 43–77)
Platelets: 277 10*3/uL (ref 150–400)
RBC: 4.95 MIL/uL (ref 3.87–5.11)
RDW: 12.8 % (ref 11.5–15.5)
WBC: 8.1 10*3/uL (ref 4.0–10.5)

## 2015-02-23 LAB — LIPASE, BLOOD: Lipase: 18 U/L (ref 11–59)

## 2015-02-23 LAB — I-STAT TROPONIN, ED: TROPONIN I, POC: 0 ng/mL (ref 0.00–0.08)

## 2015-02-23 NOTE — ED Provider Notes (Signed)
CSN: [id number]     Arrival date & time 02/23/15  2045 History  This chart was scribed for Kimberly Fuel, MD by Eustaquio Maize, ED Scribe. This patient was seen in room D35C/D35C and the patient's care was started at 12:08 AM.    Chief Complaint  Patient presents with  . Headache  . Abdominal Pain   The history is provided by the patient. No language interpreter was used.     HPI Comments: [name] is a 39 y.o. female with hx DM and HTN who presents to the Emergency Department complaining of sudden onset lower back pain and abdominal pain that began 3 days ago, worsening today. Pt describes the pain as a burning sensation. She states that applying pressure to the areas alleviates the pain. The pain is exacerbated with deep breathing. She also complains of an occiput headache that began after starting Lisinopril on Friday, 4/22 (6 days ago ). She admits to nausea, vomiting, and dizziness as well. She states that she has been unable to keep down her food and describes the emesis as black in color. Pt has not taken any medication to try and relieve her symptoms. Denies diarrhea, constipation, urinary changes, or any other symptoms.   Past Medical History  Diagnosis Date  . H. pylori infection     dx  09-02-2014-- treated w/ antibiotic  . GERD (gastroesophageal reflux disease)   . Type 2 diabetes mellitus   . Carpal tunnel syndrome, bilateral   . PCOS (polycystic ovarian syndrome)   . Menorrhagia   . Irregular menstrual cycle   . Hypertension    Past Surgical History  Procedure Laterality Date  . Cesarean section  1995  . Carpal tunnel release Bilateral 10/07/2014    Procedure: CARPAL TUNNEL RELEASE BILATERAL;  Surgeon: [name], MD;  Location: [facility];  Service: Orthopedics;  Laterality: Bilateral;   Family History  Problem Relation Age of Onset  . Heart disease Mother   . Diabetes Mother   . Hypertension Mother   . Cancer Father    History   Substance Use Topics  . Smoking status: Never Smoker   . Smokeless tobacco: Never Used  . Alcohol Use: 0.0 oz/week    0 Standard drinks or equivalent per week     Comment: socially   OB History    Gravida Para Term Preterm AB TAB SAB Ectopic Multiple Living   2 2 1 1      2      Review of Systems  Gastrointestinal: Positive for nausea, vomiting and abdominal pain. Negative for diarrhea and constipation.  Genitourinary: Negative for difficulty urinating.  Musculoskeletal: Positive for back pain.  Neurological: Positive for dizziness and headaches.  All other systems reviewed and are negative.     Allergies  Peach  Home Medications   Prior to Admission medications   Medication Sig Start Date End Date Taking? Authorizing Provider  FLUoxetine (PROZAC) 20 MG tablet Take 1 tablet (20 mg total) by mouth daily. 02/16/15  Yes Wardell Honour, MD  fluticasone (FLONASE) 50 MCG/ACT nasal spray Place 2 sprays into both nostrils daily. 01/15/15  Yes Bennett Scrape V, PA-C  glimepiride (AMARYL) 4 MG tablet Take 2 tabs (8 mg) po twice daily. 01/15/15  Yes Bennett Scrape V, PA-C  hydrochlorothiazide (HYDRODIURIL) 12.5 MG tablet Take 1 tablet (12.5 mg total) by mouth daily. 02/16/15  Yes Wardell Honour, MD  lisinopril (PRINIVIL,ZESTRIL) 20 MG tablet Take 1 tablet (20 mg total)  by mouth daily. 02/08/15  Yes Roselee Culver, MD  loratadine (CLARITIN) 10 MG tablet Take 1 tablet (10 mg total) by mouth daily. 01/15/15  Yes Bennett Scrape V, PA-C  metFORMIN (GLUCOPHAGE) 500 MG tablet Take 2 tabs by mouth (1000 mg ) 2 times daily. 01/15/15  Yes Bennett Scrape V, PA-C  naproxen sodium (ANAPROX DS) 550 MG tablet Take 1 tablet (550 mg total) by mouth 2 (two) times daily with a meal. 02/08/15 02/08/16 Yes Roselee Culver, MD  oxyCODONE-acetaminophen (ROXICET) 5-325 MG per tablet Take 1 tablet by mouth every 8 (eight) hours as needed for severe pain. Patient not taking: Reported on 02/23/2015 10/09/14   Darreld Mclean,  MD   Triage Vitals: BP 162/79 mmHg  Pulse 102  Temp(Src) 98 F (36.7 C)  Resp 18  SpO2 95%  LMP 01/31/2015   Physical Exam  Constitutional: She is oriented to person, place, and time. She appears well-developed and well-nourished. No distress.  HENT:  Head: Normocephalic and atraumatic.  Eyes: Conjunctivae and EOM are normal.  Normal fundus exam  Neck: Normal range of motion. Neck supple. No JVD present.  Cardiovascular: Normal rate, regular rhythm and normal heart sounds.   No murmur heard. Pulmonary/Chest: Effort normal and breath sounds normal. She has no wheezes. She has no rales. She exhibits no tenderness.  Abdominal: Soft. She exhibits no mass. There is tenderness (Mild tenderness lower abdomen. Moderate tenderness to upper abdomen. ). There is no rebound and no guarding.  +/- Murphy's sign.  Bowel sounds decreased.   Musculoskeletal: Normal range of motion. She exhibits edema (Trace edema. ).  Lymphadenopathy:    She has no cervical adenopathy.  Neurological: She is alert and oriented to person, place, and time. No cranial nerve deficit. She exhibits normal muscle tone. Coordination normal.  Skin: Skin is warm and dry. No rash noted.  Psychiatric: She has a normal mood and affect. Her behavior is normal. Judgment and thought content normal.  Nursing note and vitals reviewed.   ED Course  Procedures (including critical care time)  DIAGNOSTIC STUDIES: Oxygen Saturation is 95% on RA, normal by my interpretation.    COORDINATION OF CARE: 12:13 AM-Discussed treatment plan which includes US Abdomen with pt at bedside and pt agreed to plan.   Labs Review Results for orders placed or performed during the hospital encounter of 02/23/15  CBC with Differential  Result Value Ref Range   WBC 8.1 4.0 - 10.5 K/uL   RBC 4.95 3.87 - 5.11 MIL/uL   Hemoglobin 14.2 12.0 - 15.0 g/dL   HCT 41.6 36.0 - 46.0 %   MCV 84.0 78.0 - 100.0 fL   MCH 28.7 26.0 - 34.0 pg   MCHC 34.1 30.0 -  36.0 g/dL   RDW 12.8 11.5 - 15.5 %   Platelets 277 150 - 400 K/uL   Neutrophils Relative % 58 43 - 77 %   Neutro Abs 4.7 1.7 - 7.7 K/uL   Lymphocytes Relative 31 12 - 46 %   Lymphs Abs 2.5 0.7 - 4.0 K/uL   Monocytes Relative 8 3 - 12 %   Monocytes Absolute 0.7 0.1 - 1.0 K/uL   Eosinophils Relative 3 0 - 5 %   Eosinophils Absolute 0.2 0.0 - 0.7 K/uL   Basophils Relative 0 0 - 1 %   Basophils Absolute 0.0 0.0 - 0.1 K/uL  Comprehensive metabolic panel  Result Value Ref Range   Sodium 136 135 - 145 mmol/L   Potassium  3.5 3.5 - 5.1 mmol/L   Chloride 103 96 - 112 mmol/L   CO2 24 19 - 32 mmol/L   Glucose, Bld 136 (H) 70 - 99 mg/dL   BUN 10 6 - 23 mg/dL   Creatinine, Ser 0.56 0.50 - 1.10 mg/dL   Calcium 8.7 8.4 - 10.5 mg/dL   Total Protein 6.8 6.0 - 8.3 g/dL   Albumin 3.4 (L) 3.5 - 5.2 g/dL   AST 43 (H) 0 - 37 U/L   ALT 65 (H) 0 - 35 U/L   Alkaline Phosphatase 90 39 - 117 U/L   Total Bilirubin 0.7 0.3 - 1.2 mg/dL   GFR calc non Af Amer >90 >90 mL/min   GFR calc Af Amer >90 >90 mL/min   Anion gap 9 5 - 15  Lipase, blood  Result Value Ref Range   Lipase 18 11 - 59 U/L  Urinalysis, Routine w reflex microscopic  Result Value Ref Range   Color, Urine AMBER (A) YELLOW   APPearance CLOUDY (A) CLEAR   Specific Gravity, Urine 1.029 1.005 - 1.030   pH 5.0 5.0 - 8.0   Glucose, UA 100 (A) NEGATIVE mg/dL   Hgb urine dipstick LARGE (A) NEGATIVE   Bilirubin Urine SMALL (A) NEGATIVE   Ketones, ur 15 (A) NEGATIVE mg/dL   Protein, ur NEGATIVE NEGATIVE mg/dL   Urobilinogen, UA 1.0 0.0 - 1.0 mg/dL   Nitrite NEGATIVE NEGATIVE   Leukocytes, UA NEGATIVE NEGATIVE  Urine microscopic-add on  Result Value Ref Range   Squamous Epithelial / LPF FEW (A) RARE   WBC, UA 0-2 <3 WBC/hpf   RBC / HPF 21-50 <3 RBC/hpf   Bacteria, UA FEW (A) RARE  I-stat troponin, ED (only if pt is 39 y.o. or older & pain is above umbilicus) - do not order at [facility]  Result Value Ref Range   Troponin i, poc 0.00 0.00 - 0.08  ng/mL   Comment 3          POC Urine Pregnancy, ED  (If Pre-menopausal female) - do not order at [facility]  Result Value Ref Range   Preg Test, Ur NEGATIVE NEGATIVE   Imaging Review US Abdomen Complete  02/24/2015   CLINICAL DATA:  Acute onset of generalized abdominal pain. Initial encounter.  EXAM: ULTRASOUND ABDOMEN COMPLETE  COMPARISON:  None.  FINDINGS: Gallbladder: No gallstones or wall thickening visualized. No sonographic Murphy sign noted.  Common bile duct: Not visualized. No intrahepatic biliary ductal dilatation is seen.  Liver: No focal lesion identified. Diffusely increased parenchymal echogenicity likely reflects fatty infiltration.  IVC: No abnormality visualized.  Pancreas: Visualized portion unremarkable.  Spleen: Size and appearance within normal limits.  Right Kidney: Length: 13.5 cm. Echogenicity within normal limits. No mass or hydronephrosis visualized.  Left Kidney: Length: 14.4 cm. Echogenicity within normal limits. No mass or hydronephrosis visualized.  Abdominal aorta: No aneurysm visualized.  Other findings: None.  IMPRESSION: 1. No acute abnormality seen within the abdomen. 2. Diffuse fatty infiltration within the liver.   Electronically Signed   By: Garald Balding M.D.   On: 02/24/2015 03:21     EKG Interpretation   Date/Time:  Wednesday February 23 2015 20:59:51 EDT Ventricular Rate:  94 PR Interval:  138 QRS Duration: 76 QT Interval:  350 QTC Calculation: 437 R Axis:   14 Text Interpretation:  Normal sinus rhythm Left ventricular hypertrophy  Nonspecific T wave abnormality Abnormal ECG No old tracing to compare  Confirmed by [facility]  MD, Janicia Monterrosa ([id number])  on 02/24/2015 12:13:27 AM      MDM   Final diagnoses:  Abdominal pain, unspecified abdominal location  Generalized headache  Elevated transaminase level    Upper abdominal pain of uncertain cause. Review of old records shows history of elevated transaminases. She will be sent for ultrasound to rule out  cholelithiasis. Headache which seems to be muscle contraction headache. She will be given IV fluids, metoclopramide, and diphenhydramine.  Following above-noted treatment, headache was improved but she continued to have some abdominal pain which was treated with morphine. Ultrasound has come back showing no evidence of cholelithiasis. Motor workup is significant only for mild elevation of transaminases not significantly changed from baseline. She will be treated for possible peptic ulcer disease or GERD and is started on pantoprazole on. She is also given prescription for oxycodone-acetaminophen for pain. She is referred back to her PCP. If pain persists, she may need referral to gastroenterology for EGD.   I personally performed the services described in this documentation, which was scribed in my presence. The recorded information has been reviewed and is accurate.       Kimberly Fuel, MD 18/33/58 [id number]

## 2015-02-23 NOTE — ED Notes (Signed)
C/o headache and generalized abd pain since yesterday with nausea and vomiting.  Denies diarrhea, constipation, or urinary complaint.

## 2015-02-24 ENCOUNTER — Emergency Department (HOSPITAL_COMMUNITY): Payer: BLUE CROSS/BLUE SHIELD

## 2015-02-24 LAB — URINE MICROSCOPIC-ADD ON

## 2015-02-24 LAB — URINALYSIS, ROUTINE W REFLEX MICROSCOPIC
Glucose, UA: 100 mg/dL — AB
KETONES UR: 15 mg/dL — AB
Leukocytes, UA: NEGATIVE
NITRITE: NEGATIVE
PROTEIN: NEGATIVE mg/dL
SPECIFIC GRAVITY, URINE: 1.029 (ref 1.005–1.030)
UROBILINOGEN UA: 1 mg/dL (ref 0.0–1.0)
pH: 5 (ref 5.0–8.0)

## 2015-02-24 LAB — POC URINE PREG, ED: PREG TEST UR: NEGATIVE

## 2015-02-24 MED ORDER — MORPHINE SULFATE 4 MG/ML IJ SOLN
4.0000 mg | Freq: Once | INTRAMUSCULAR | Status: AC
  Administered 2015-02-24: 4 mg via INTRAVENOUS
  Filled 2015-02-24: qty 1

## 2015-02-24 MED ORDER — PANTOPRAZOLE SODIUM 40 MG PO TBEC
40.0000 mg | DELAYED_RELEASE_TABLET | Freq: Once | ORAL | Status: AC
Start: 2015-02-24 — End: 2015-02-24
  Administered 2015-02-24: 40 mg via ORAL
  Filled 2015-02-24: qty 1

## 2015-02-24 MED ORDER — DIPHENHYDRAMINE HCL 50 MG/ML IJ SOLN
25.0000 mg | Freq: Once | INTRAMUSCULAR | Status: AC
  Administered 2015-02-24: 25 mg via INTRAVENOUS
  Filled 2015-02-24: qty 1

## 2015-02-24 MED ORDER — SODIUM CHLORIDE 0.9 % IV SOLN
1000.0000 mL | INTRAVENOUS | Status: DC
  Administered 2015-02-24: 1000 mL via INTRAVENOUS

## 2015-02-24 MED ORDER — SODIUM CHLORIDE 0.9 % IV SOLN
1000.0000 mL | Freq: Once | INTRAVENOUS | Status: AC
  Administered 2015-02-24: 1000 mL via INTRAVENOUS

## 2015-02-24 MED ORDER — OXYCODONE-ACETAMINOPHEN 5-325 MG PO TABS: 1.0000 | ORAL_TABLET | ORAL | Status: DC | PRN

## 2015-02-24 MED ORDER — PANTOPRAZOLE SODIUM 40 MG PO TBEC: 40.0000 mg | DELAYED_RELEASE_TABLET | Freq: Every day | ORAL | Status: DC

## 2015-02-24 MED ORDER — METOCLOPRAMIDE HCL 5 MG/ML IJ SOLN
10.0000 mg | Freq: Once | INTRAMUSCULAR | Status: AC
  Administered 2015-02-24: 10 mg via INTRAVENOUS
  Filled 2015-02-24: qty 2

## 2015-02-24 MED ORDER — GI COCKTAIL ~~LOC~~
30.0000 mL | Freq: Once | ORAL | Status: AC
  Administered 2015-02-24: 30 mL via ORAL
  Filled 2015-02-24: qty 30

## 2015-02-24 NOTE — ED Notes (Signed)
MD at bedside. 

## 2015-02-24 NOTE — Discharge Instructions (Signed)
Abdominal Pain °Many things can cause abdominal pain. Usually, abdominal pain is not caused by a disease and will improve without treatment. It can often be observed and treated at home. Your health care provider will do a physical exam and possibly order blood tests and X-rays to help determine the seriousness of your pain. However, in many cases, more time must pass before a clear cause of the pain can be found. Before that point, your health care provider may not know if you need more testing or further treatment. °HOME CARE INSTRUCTIONS  °Monitor your abdominal pain for any changes. The following actions may help to alleviate any discomfort you are experiencing: °· Only take over-the-counter or prescription medicines as directed by your health care provider. °· Do not take laxatives unless directed to do so by your health care provider. °· Try a clear liquid diet (broth, tea, or water) as directed by your health care provider. Slowly move to a bland diet as tolerated. °SEEK MEDICAL CARE IF: °· You have unexplained abdominal pain. °· You have abdominal pain associated with nausea or diarrhea. °· You have pain when you urinate or have a bowel movement. °· You experience abdominal pain that wakes you in the night. °· You have abdominal pain that is worsened or improved by eating food. °· You have abdominal pain that is worsened with eating fatty foods. °· You have a fever. °SEEK IMMEDIATE MEDICAL CARE IF:  °· Your pain does not go away within 2 hours. °· You keep throwing up (vomiting). °· Your pain is felt only in portions of the abdomen, such as the right side or the left lower portion of the abdomen. °· You pass bloody or black tarry stools. °MAKE SURE YOU: °· Understand these instructions.   °· Will watch your condition.   °· Will get help right away if you are not doing well or get worse.   °Document Released: 07/25/2005 Document Revised: 10/20/2013 Document Reviewed: 06/24/2013 °[employer]® Patient Information  ©2015 [employer]. This information is not intended to replace advice given to you by your health care provider. Make sure you discuss any questions you have with your health care provider. ° °General Headache Without Cause °A headache is pain or discomfort felt around the head or neck area. The specific cause of a headache may not be found. There are many causes and types of headaches. A few common ones are: °· Tension headaches. °· Migraine headaches. °· Cluster headaches. °· Chronic daily headaches. °HOME CARE INSTRUCTIONS  °· Keep all follow-up appointments with your caregiver or any specialist referral. °· Only take over-the-counter or prescription medicines for pain or discomfort as directed by your caregiver. °· Lie down in a dark, quiet room when you have a headache. °· Keep a headache journal to find out what may trigger your migraine headaches. For example, write down: °¨ What you eat and drink. °¨ How much sleep you get. °¨ Any change to your diet or medicines. °· Try massage or other relaxation techniques. °· Put ice packs or heat on the head and neck. Use these 3 to 4 times per day for 15 to 20 minutes each time, or as needed. °· Limit stress. °· Sit up straight, and do not tense your muscles. °· Quit smoking if you smoke. °· Limit alcohol use. °· Decrease the amount of caffeine you drink, or stop drinking caffeine. °· Eat and sleep on a regular schedule. °· Get 7 to 9 hours of sleep, or as recommended by your caregiver. °·   Keep lights dim if bright lights bother you and make your headaches worse. SEEK MEDICAL CARE IF:   You have problems with the medicines you were prescribed.  Your medicines are not working.  You have a change from the usual headache.  You have nausea or vomiting. SEEK IMMEDIATE MEDICAL CARE IF:   Your headache becomes severe.  You have a fever.  You have a stiff neck.  You have loss of vision.  You have muscular weakness or loss of muscle control.  You start  losing your balance or have trouble walking.  You feel faint or pass out.  You have severe symptoms that are different from your first symptoms. MAKE SURE YOU:   Understand these instructions.  Will watch your condition.  Will get help right away if you are not doing well or get worse. Document Released: 10/15/2005 Document Revised: 01/07/2012 Document Reviewed: 10/31/2011 [facility] Patient Information 2015 [place], [place]. This information is not intended to replace advice given to you by your health care provider. Make sure you discuss any questions you have with your health care provider.  Pantoprazole tablets What is this medicine? PANTOPRAZOLE (pan TOE pra zole) prevents the production of acid in the stomach. It is used to treat gastroesophageal reflux disease (GERD), inflammation of the esophagus, and Zollinger-Ellison syndrome. This medicine may be used for other purposes; ask your health care provider or pharmacist if you have questions. COMMON BRAND NAME(S): Protonix What should I tell my health care provider before I take this medicine? They need to know if you have any of these conditions: -liver disease -low levels of magnesium in the blood -an unusual or allergic reaction to omeprazole, lansoprazole, pantoprazole, rabeprazole, other medicines, foods, dyes, or preservatives -pregnant or trying to get pregnant -breast-feeding How should I use this medicine? Take this medicine by mouth. Swallow the tablets whole with a drink of water. Follow the directions on the prescription label. Do not crush, break, or chew. Take your medicine at regular intervals. Do not take your medicine more often than directed. Talk to your pediatrician regarding the use of this medicine in children. While this drug may be prescribed for children as young as 5 years for selected conditions, precautions do apply. Overdosage: If you think you have taken too much of this medicine contact a poison control  center or emergency room at once. NOTE: This medicine is only for you. Do not share this medicine with others. What if I miss a dose? If you miss a dose, take it as soon as you can. If it is almost time for your next dose, take only that dose. Do not take double or extra doses. What may interact with this medicine? Do not take this medicine with any of the following medications: -atazanavir -nelfinavir This medicine may also interact with the following medications: -ampicillin -delavirdine -digoxin -diuretics -iron salts -medicines for fungal infections like ketoconazole, itraconazole and voriconazole -warfarin This list may not describe all possible interactions. Give your health care provider a list of all the medicines, herbs, non-prescription drugs, or dietary supplements you use. Also tell them if you smoke, drink alcohol, or use illegal drugs. Some items may interact with your medicine. What should I watch for while using this medicine? It can take several days before your stomach pain gets better. Check with your doctor or health care professional if your condition does not start to get better, or if it gets worse. You may need blood work done while you are taking  this medicine. What side effects may I notice from receiving this medicine? Side effects that you should report to your doctor or health care professional as soon as possible: -allergic reactions like skin rash, itching or hives, swelling of the face, lips, or tongue -bone, muscle or joint pain -breathing problems -chest pain or chest tightness -dark yellow or brown urine -dizziness -fast, irregular heartbeat -feeling faint or lightheaded -fever or sore throat -muscle spasm -palpitations -redness, blistering, peeling or loosening of the skin, including inside the mouth -seizures -tremors -unusual bleeding or bruising -unusually weak or tired -yellowing of the eyes or skin Side effects that usually do not  require medical attention (Report these to your doctor or health care professional if they continue or are bothersome.): -constipation -diarrhea -dry mouth -headache -nausea This list may not describe all possible side effects. Call your doctor for medical advice about side effects. You may report side effects to FDA at 1-800-FDA-1088. Where should I keep my medicine? Keep out of the reach of children. Store at room temperature between 15 and 30 degrees C (59 and 86 degrees F). Protect from light and moisture. Throw away any unused medicine after the expiration date. NOTE: This sheet is a summary. It may not cover all possible information. If you have questions about this medicine, talk to your doctor, pharmacist, or health care provider.  2015, Elsevier/Gold Standard. (2012-08-13 16:40:16)  Acetaminophen; Oxycodone tablets What is this medicine? ACETAMINOPHEN; OXYCODONE (a set a MEE noe fen; ox i KOE done) is a pain reliever. It is used to treat mild to moderate pain. This medicine may be used for other purposes; ask your health care provider or pharmacist if you have questions. COMMON BRAND NAME(S): Endocet, Magnacet, Narvox, Percocet, Perloxx, Primalev, Primlev, Roxicet, Xolox What should I tell my health care provider before I take this medicine? They need to know if you have any of these conditions: -brain tumor -Crohn's disease, inflammatory bowel disease, or ulcerative colitis -drug abuse or addiction -head injury -heart or circulation problems -if you often drink alcohol -kidney disease or problems going to the bathroom -liver disease -lung disease, asthma, or breathing problems -an unusual or allergic reaction to acetaminophen, oxycodone, other opioid analgesics, other medicines, foods, dyes, or preservatives -pregnant or trying to get pregnant -breast-feeding How should I use this medicine? Take this medicine by mouth with a full glass of water. Follow the directions on  the prescription label. Take your medicine at regular intervals. Do not take your medicine more often than directed. Talk to your pediatrician regarding the use of this medicine in children. Special care may be needed. Patients over 74 years old may have a stronger reaction and need a smaller dose. Overdosage: If you think you have taken too much of this medicine contact a poison control center or emergency room at once. NOTE: This medicine is only for you. Do not share this medicine with others. What if I miss a dose? If you miss a dose, take it as soon as you can. If it is almost time for your next dose, take only that dose. Do not take double or extra doses. What may interact with this medicine? -alcohol -antihistamines -barbiturates like amobarbital, butalbital, butabarbital, methohexital, pentobarbital, phenobarbital, thiopental, and secobarbital -benztropine -drugs for bladder problems like solifenacin, trospium, oxybutynin, tolterodine, hyoscyamine, and methscopolamine -drugs for breathing problems like ipratropium and tiotropium -drugs for certain stomach or intestine problems like propantheline, homatropine methylbromide, glycopyrrolate, atropine, belladonna, and dicyclomine -general anesthetics like etomidate, ketamine, nitrous  oxide, propofol, desflurane, enflurane, halothane, isoflurane, and sevoflurane -medicines for depression, anxiety, or psychotic disturbances -medicines for sleep -muscle relaxants -naltrexone -narcotic medicines (opiates) for pain -phenothiazines like perphenazine, thioridazine, chlorpromazine, mesoridazine, fluphenazine, prochlorperazine, promazine, and trifluoperazine -scopolamine -tramadol -trihexyphenidyl This list may not describe all possible interactions. Give your health care provider a list of all the medicines, herbs, non-prescription drugs, or dietary supplements you use. Also tell them if you smoke, drink alcohol, or use illegal drugs. Some items  may interact with your medicine. What should I watch for while using this medicine? Tell your doctor or health care professional if your pain does not go away, if it gets worse, or if you have new or a different type of pain. You may develop tolerance to the medicine. Tolerance means that you will need a higher dose of the medication for pain relief. Tolerance is normal and is expected if you take this medicine for a long time. Do not suddenly stop taking your medicine because you may develop a severe reaction. Your body becomes used to the medicine. This does NOT mean you are addicted. Addiction is a behavior related to getting and using a drug for a non-medical reason. If you have pain, you have a medical reason to take pain medicine. Your doctor will tell you how much medicine to take. If your doctor wants you to stop the medicine, the dose will be slowly lowered over time to avoid any side effects. You may get drowsy or dizzy. Do not drive, use machinery, or do anything that needs mental alertness until you know how this medicine affects you. Do not stand or sit up quickly, especially if you are an older patient. This reduces the risk of dizzy or fainting spells. Alcohol may interfere with the effect of this medicine. Avoid alcoholic drinks. There are different types of narcotic medicines (opiates) for pain. If you take more than one type at the same time, you may have more side effects. Give your health care provider a list of all medicines you use. Your doctor will tell you how much medicine to take. Do not take more medicine than directed. Call emergency for help if you have problems breathing. The medicine will cause constipation. Try to have a bowel movement at least every 2 to 3 days. If you do not have a bowel movement for 3 days, call your doctor or health care professional. Do not take Tylenol (acetaminophen) or medicines that have acetaminophen with this medicine. Too much acetaminophen can be  very dangerous. Many nonprescription medicines contain acetaminophen. Always read the labels carefully to avoid taking more acetaminophen. What side effects may I notice from receiving this medicine? Side effects that you should report to your doctor or health care professional as soon as possible: -allergic reactions like skin rash, itching or hives, swelling of the face, lips, or tongue -breathing difficulties, wheezing -confusion -light headedness or fainting spells -severe stomach pain -unusually weak or tired -yellowing of the skin or the whites of the eyes Side effects that usually do not require medical attention (report to your doctor or health care professional if they continue or are bothersome): -dizziness -drowsiness -nausea -vomiting This list may not describe all possible side effects. Call your doctor for medical advice about side effects. You may report side effects to FDA at 1-800-FDA-1088. Where should I keep my medicine? Keep out of the reach of children. This medicine can be abused. Keep your medicine in a safe place to protect it from theft.  Do not share this medicine with anyone. Selling or giving away this medicine is dangerous and against the law. Store at room temperature between 20 and 25 degrees C (68 and 77 degrees F). Keep container tightly closed. Protect from light. This medicine may cause accidental overdose and death if it is taken by other adults, children, or pets. Flush any unused medicine down the toilet to reduce the chance of harm. Do not use the medicine after the expiration date. NOTE: This sheet is a summary. It may not cover all possible information. If you have questions about this medicine, talk to your doctor, pharmacist, or health care provider.  2015, Elsevier/Gold Standard. (2013-06-08 13:17:35)

## 2015-02-24 NOTE — ED Notes (Signed)
Pt requesting pain medication. MD made aware. 

## 2015-03-05 ENCOUNTER — Ambulatory Visit (INDEPENDENT_AMBULATORY_CARE_PROVIDER_SITE_OTHER): Payer: BLUE CROSS/BLUE SHIELD | Admitting: Family Medicine

## 2015-03-05 VITALS — BP 138/76 | HR 85 | Temp 98.0°F | Resp 16 | Ht 62.0 in | Wt 275.0 lb

## 2015-03-05 DIAGNOSIS — N912 Amenorrhea, unspecified: Secondary | ICD-10-CM

## 2015-03-05 DIAGNOSIS — H109 Unspecified conjunctivitis: Secondary | ICD-10-CM | POA: Diagnosis not present

## 2015-03-05 DIAGNOSIS — R059 Cough, unspecified: Secondary | ICD-10-CM

## 2015-03-05 DIAGNOSIS — R05 Cough: Secondary | ICD-10-CM | POA: Diagnosis not present

## 2015-03-05 MED ORDER — ERYTHROMYCIN 5 MG/GM OP OINT: TOPICAL_OINTMENT | OPHTHALMIC | Status: DC

## 2015-03-05 MED ORDER — BENZONATATE 100 MG PO CAPS: 100.0000 mg | ORAL_CAPSULE | Freq: Three times a day (TID) | ORAL | Status: DC | PRN

## 2015-03-05 NOTE — Patient Instructions (Signed)
Use the tessalon perles as needed for your cough Use the erythromycin eye ointment 4-6x a day for one week for your eye infection. If your eyes are not improved in 1-2 days please come back in to clinic- Sooner if worse.   You can return to work once your eyes are not crusted in the morning.

## 2015-03-05 NOTE — Progress Notes (Signed)
[facility] [address], [place] [address] [phone]  Date:  03/05/2015   Name:  [name]   DOB:  1976-04-28   MRN:  [id number]  PCP:  Ruben Reason, MD    Chief Complaint: Eye Drainage and Cough   History of Present Illness:  [name] is a 39 y.o. very pleasant female patient who presents with the following:  Here today with illness.   She has history of allergies.  Yesterday she ddischarge from her right eye, and this am both were crusty and sticky.  She had to use a hot compres to get her eyes open.    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 Her vision seems to be normal to her Her eye do feel gritty and like there is a film over them She does not wear contacts   She also notes a cough for the last few days  Vision testing is ok  Her LMP was 2 months ago- she has been spotting for the last few days however and thinks that her menses are starting  She took a HCG at the hospital last week  She is not on contraception- she has not used contracpetion for the last 21 years and has not gotten pregnant so she is not concerned She does not want to take a pregnancy trest today, "I have taken so many and they are always negative."   Patient Active Problem List   Diagnosis Date Noted  . Severe obesity (BMI >= 40) 09/14/2014  . Irregular menstrual cycle 11/12/2013  . Type II diabetes mellitus, uncontrolled 06/20/2009  . POLYCYSTIC OVARIAN DISEASE 06/20/2009  . CARPAL TUNNEL SYNDROME 06/20/2009    Past Medical History  Diagnosis Date  . H. pylori infection     dx  09-02-2014-- treated w/ antibiotic  . GERD (gastroesophageal reflux disease)   . Type 2 diabetes mellitus   . Carpal tunnel syndrome, bilateral   . PCOS (polycystic ovarian syndrome)   . Menorrhagia   . Irregular menstrual cycle   . Hypertension     Past Surgical History  Procedure Laterality Date  . Cesarean section  1995  . Carpal tunnel release Bilateral  10/07/2014    Procedure: CARPAL TUNNEL RELEASE BILATERAL;  Surgeon: [name], MD;  Location: [facility];  Service: Orthopedics;  Laterality: Bilateral;    History  Substance Use Topics  . Smoking status: Never Smoker   . Smokeless tobacco: Never Used  . Alcohol Use: 0.0 oz/week    0 Standard drinks or equivalent per week     Comment: socially    Family History  Problem Relation Age of Onset  . Heart disease Mother   . Diabetes Mother   . Hypertension Mother   . Cancer Father     Allergies  Allergen Reactions  . Peach [Prunus Persica] Anaphylaxis    Medication list has been reviewed and updated.  Current Outpatient Prescriptions on File Prior to Visit  Medication Sig Dispense Refill  . FLUoxetine (PROZAC) 20 MG tablet Take 1 tablet (20 mg total) by mouth daily. 30 tablet 3  . fluticasone (FLONASE) 50 MCG/ACT nasal spray Place 2 sprays into both nostrils daily. 16 g 12  . glimepiride (AMARYL) 4 MG tablet Take 2 tabs (8 mg) po twice daily. 120 tablet 3  . hydrochlorothiazide (HYDRODIURIL) 12.5 MG tablet Take 1 tablet (12.5 mg total) by mouth daily. 30 tablet 2  . lisinopril (PRINIVIL,ZESTRIL) 20  MG tablet Take 1 tablet (20 mg total) by mouth daily. 90 tablet 3  . loratadine (CLARITIN) 10 MG tablet Take 1 tablet (10 mg total) by mouth daily. 30 tablet 11  . metFORMIN (GLUCOPHAGE) 500 MG tablet Take 2 tabs by mouth (1000 mg ) 2 times daily. 120 tablet 3  . naproxen sodium (ANAPROX DS) 550 MG tablet Take 1 tablet (550 mg total) by mouth 2 (two) times daily with a meal. 40 tablet 0  . oxyCODONE-acetaminophen (PERCOCET) 5-325 MG per tablet Take 1 tablet by mouth every 4 (four) hours as needed for moderate pain. 10 tablet 0  . pantoprazole (PROTONIX) 40 MG tablet Take 1 tablet (40 mg total) by mouth daily. 15 tablet 0   No current facility-administered medications on file prior to visit.    Review of Systems:  As per HPI- otherwise negative.   Physical  Examination: Filed Vitals:   03/05/15 0811  BP: 138/76  Pulse: 85  Temp: 98 F (36.7 C)  Resp: 16   Filed Vitals:   03/05/15 0811  Height: 5\' 2"  (1.575 m)  Weight: 275 lb (124.739 kg)   Body mass index is 50.29 kg/(m^2). Ideal Body Weight: Weight in (lb) to have BMI = 25: 136.4  GEN: WDWN, NAD, Non-toxic, A & O x 3, morbidly obese HEENT: Atraumatic, Normocephalic. Neck supple. No masses, No LAD.  Bilateral TM wnl, oropharynx normal.  PEERL,EOMI.   Acanthosis nigricans  Ears and Nose: No external deformity. CV: RRR, No M/G/R. No JVD. No thrill. No extra heart sounds. PULM: CTA B, no wheezes, crackles, rhonchi. No retractions. No resp. distress. No accessory muscle use. EXTR: No c/c/e NEURO Normal gait.  Both eyes are injected, conjunctivae are red and there is mild crusting of the left eye.   Normal limited fundoscopic exam PSYCH: Normally interactive. Conversant. Not depressed or anxious appearing.  Calm demeanor.    Assessment and Plan: Bilateral conjunctivitis - Plan: erythromycin ophthalmic ointment  Cough - Plan: benzonatate (TESSALON) 100 MG capsule  Amenorrhea  Morbid obesity  Treat for conjunctivitis with erythromycin opthalmic.  She is amenorrheic, likely due to anovulation secondary to metabolic syndrome/ morbid obesity. Negative HCG a week ago at hospital. She declines yet another pregnancy test, will treat with erythromycin ointment for her conjunctivitis, tessalon perles for cough.     Signed Lamar Blinks, MD

## 2015-03-14 ENCOUNTER — Ambulatory Visit (INDEPENDENT_AMBULATORY_CARE_PROVIDER_SITE_OTHER): Payer: BLUE CROSS/BLUE SHIELD | Admitting: Emergency Medicine

## 2015-03-14 VITALS — BP 180/90 | HR 83 | Temp 98.9°F | Resp 18 | Ht 62.0 in | Wt 280.0 lb

## 2015-03-14 DIAGNOSIS — R05 Cough: Secondary | ICD-10-CM

## 2015-03-14 DIAGNOSIS — J209 Acute bronchitis, unspecified: Secondary | ICD-10-CM

## 2015-03-14 DIAGNOSIS — R059 Cough, unspecified: Secondary | ICD-10-CM

## 2015-03-14 MED ORDER — HYDROCOD POLST-CPM POLST ER 10-8 MG/5ML PO SUER: 5.0000 mL | Freq: Two times a day (BID) | ORAL | Status: DC

## 2015-03-14 MED ORDER — CLARITHROMYCIN 500 MG PO TABS: 500.0000 mg | ORAL_TABLET | Freq: Two times a day (BID) | ORAL | Status: DC

## 2015-03-14 NOTE — Progress Notes (Signed)
[facility] [address] [place] [address] [phone]  Date:  03/14/2015   Name:  [name]   DOB:  1976/01/10   MRN:  [id number]  PCP:  Ruben Reason, MD    Chief Complaint: Cough and Medication Problem   History of Present Illness:  [name] is a 39 y.o. very pleasant female patient who presents with the following:  Ill two weeks. Treated previously on visit with tessalon with no improvement Scant sputum production No wheezing but has exertional shortness of breath Patient denies fever or chills. Patient denies nausea or vomiting No hemoptysis No improvement with over the counter medications or other home remedies. Denies other complaint or health concern today.   Patient Active Problem List   Diagnosis Date Noted  . Severe obesity (BMI >= 40) 09/14/2014  . Irregular menstrual cycle 11/12/2013  . Type II diabetes mellitus, uncontrolled 06/20/2009  . POLYCYSTIC OVARIAN DISEASE 06/20/2009  . CARPAL TUNNEL SYNDROME 06/20/2009    Past Medical History  Diagnosis Date  . H. pylori infection     dx  09-02-2014-- treated w/ antibiotic  . GERD (gastroesophageal reflux disease)   . Type 2 diabetes mellitus   . Carpal tunnel syndrome, bilateral   . PCOS (polycystic ovarian syndrome)   . Menorrhagia   . Irregular menstrual cycle   . Hypertension     Past Surgical History  Procedure Laterality Date  . Cesarean section  1995  . Carpal tunnel release Bilateral 10/07/2014    Procedure: CARPAL TUNNEL RELEASE BILATERAL;  Surgeon: [name], MD;  Location: [facility];  Service: Orthopedics;  Laterality: Bilateral;    History  Substance Use Topics  . Smoking status: Never Smoker   . Smokeless tobacco: Never Used  . Alcohol Use: 0.0 oz/week    0 Standard drinks or equivalent per week     Comment: socially    Family History  Problem Relation Age of Onset  . Heart disease Mother   . Diabetes Mother   .  Hypertension Mother   . Cancer Father     Allergies  Allergen Reactions  . Peach [Prunus Persica] Anaphylaxis    Medication list has been reviewed and updated.  Current Outpatient Prescriptions on File Prior to Visit  Medication Sig Dispense Refill  . FLUoxetine (PROZAC) 20 MG tablet Take 1 tablet (20 mg total) by mouth daily. 30 tablet 3  . fluticasone (FLONASE) 50 MCG/ACT nasal spray Place 2 sprays into both nostrils daily. 16 g 12  . glimepiride (AMARYL) 4 MG tablet Take 2 tabs (8 mg) po twice daily. 120 tablet 3  . hydrochlorothiazide (HYDRODIURIL) 12.5 MG tablet Take 1 tablet (12.5 mg total) by mouth daily. 30 tablet 2  . lisinopril (PRINIVIL,ZESTRIL) 20 MG tablet Take 1 tablet (20 mg total) by mouth daily. 90 tablet 3  . loratadine (CLARITIN) 10 MG tablet Take 1 tablet (10 mg total) by mouth daily. 30 tablet 11  . metFORMIN (GLUCOPHAGE) 500 MG tablet Take 2 tabs by mouth (1000 mg ) 2 times daily. 120 tablet 3  . benzonatate (TESSALON) 100 MG capsule Take 1 capsule (100 mg total) by mouth 3 (three) times daily as needed for cough. (Patient not taking: Reported on 03/14/2015) 40 capsule 0   No current facility-administered medications on file prior to visit.    Review of Systems:  Review of Systems  Constitutional: Negative for fever, chills and fatigue.  HENT: Negative for congestion, ear pain,  hearing loss, postnasal drip, rhinorrhea and sinus pressure.   Eyes: Negative for discharge and redness.  Respiratory: Negative for cough, shortness of breath and wheezing.   Cardiovascular: Negative for chest pain and leg swelling.  Gastrointestinal: Negative for nausea, vomiting, abdominal pain, constipation and blood in stool.  Genitourinary: Negative for dysuria, urgency and frequency.  Musculoskeletal: Negative for neck stiffness.  Skin: Negative for rash.  Neurological: Negative for seizures, weakness and headaches.   Physical Examination: Filed Vitals:   03/14/15 1924  BP:  180/90  Pulse: 83  Temp: 98.9 F (37.2 C)  Resp: 18   Filed Vitals:   03/14/15 1924  Height: 5\' 2"  (1.575 m)  Weight: 280 lb (127.007 kg)   Body mass index is 51.2 kg/(m^2). Ideal Body Weight: Weight in (lb) to have BMI = 25: 136.4  GEN: morbid obesity, NAD, Non-toxic, A & O x 3 HEENT: Atraumatic, Normocephalic. Neck supple. No masses, No LAD. Ears and Nose: No external deformity. CV: RRR, No M/G/R. No JVD. No thrill. No extra heart sounds. PULM: CTA B, no wheezes, crackles, rhonchi. No retractions. No resp. distress. No accessory muscle use. ABD: S, NT, ND, +BS. No rebound. No HSM. EXTR: No c/c/e NEURO Normal gait.  PSYCH: Normally interactive. Conversant. Not depressed or anxious appearing.  Calm demeanor.    Assessment and Plan: 1. Cough    2. Acute bronchitis, unspecified organism biaxin tussionex      Signed Ellison Carwin, MD

## 2015-03-14 NOTE — Patient Instructions (Signed)
Bronquitis aguda (Acute Bronchitis) La bronquitis es una inflamacin de las vas respiratorias que se extienden desde la trquea [facility] pulmones (bronquios). La inflamacin produce la formacin de mucosidad. Esto produce tos, que es el sntoma ms frecuente de la bronquitis.  Cuando la bronquitis es [place], generalmente comienza de [place] sbita y desaparece luego de un par de semanas. El hbito de fumar, las alergias y el asma pueden empeorar la bronquitis. Los episodios repetidos de bronquitis pueden causar ms problemas pulmonares.  CAUSAS La causa ms frecuente de bronquitis aguda es el mismo virus que produce el resfro. El virus puede propagarse de Ardelia Mems persona a la otra (contagioso) a travs de la tos y los estornudos, y al tocar objetos contaminados. [place].  Cristy Hilts.  Tos con mucosidad.  Dolores [employer] cuerpo.  Congestin en el pecho.  Escalofros.  Falta de aire.  Dolor de Investment banker, operational. DIAGNSTICO  La bronquitis aguda en general se diagnostica con un examen fsico. El mdico tambin le har preguntas sobre su historia clnica. En algunos casos se indican otros estudios, como radiografas, para Clinical research associate.  TRATAMIENTO  La bronquitis aguda generalmente desaparece en un par de semanas. Con frecuencia, no es Systems analyst. Los medicamentos se indican para aliviar la fiebre o la tos. Generalmente, no es necesario el uso de antibiticos, pero pueden recetarse en ciertas ocasiones. En algunos casos, se recomienda el uso de un inhalador para mejorar la falta de aire y Aeronautical engineer tos. Un vaporizador de aire fro podr ayudarlo a [employer] bronquiales y Armed forces technical officer su eliminacin.  INSTRUCCIONES PARA EL CUIDADO EN EL HOGAR  Descanse lo suficiente.  Beba lquidos en abundancia para mantener la orina de color claro o amarillo plido (excepto que padezca una enfermedad que requiera la restriccin de lquidos). El aumento  de lquidos puede ayudar a que las secreciones respiratorias (esputo) sean menos espesas y a reducir la congestin del pecho, y Mining engineer deshidratacin.  Tome los medicamentos solamente como se lo haya indicado el mdico.  Si le recetaron antibiticos, asegrese de terminarlos, incluso si comienza a sentirse mejor.  Evite fumar o aspirar el humo de otros fumadores. La exposicin al humo del cigarrillo o a irritantes qumicos har que la bronquitis empeore. Si fuma, considere el uso de goma de Higher education careers adviser o la aplicacin de parches en la piel que contengan nicotina para Public house manager los sntomas de abstinencia. Si deja de fumar, sus pulmones se curarn ms rpido.  Reduzca la probabilidad de otro episodio de bronquitis aguda lavando sus manos con frecuencia, evitando a las personas que tengan sntomas y tratando de no tocarse las manos con la boca, la nariz o los ojos.  Concurra a todas las visitas de control como se lo haya indicado el mdico. SOLICITE ATENCIN MDICA SI: Los sntomas no mejoran despus de una semana de [place].  SOLICITE ATENCIN MDICA DE INMEDIATO SI:  Comienza a tener fiebre o escalofros cada vez ms intensos.  Siente dolor en el pecho.  Le falta el aire de manera preocupante.  La flema tiene [place].  Se deshidrata.  Se desmaya o siente que va a desmayarse de forma repetida.  Tiene vmitos que se repiten.  Tiene un dolor de cabeza intenso. ASEGRESE DE QUE:   Comprende estas instrucciones.  Controlar su afeccin.  Recibir ayuda de inmediato si no mejora o si empeora. Document Released: 10/15/2005 Document Revised: 03/01/2014 [facility] Patient Information 2015 [place], [place]. This information is not intended to replace  advice given to you by your health care provider. Make sure you discuss any questions you have with your health care provider.  

## 2015-03-17 ENCOUNTER — Encounter: Payer: Self-pay | Admitting: Neurology

## 2015-03-17 ENCOUNTER — Ambulatory Visit (INDEPENDENT_AMBULATORY_CARE_PROVIDER_SITE_OTHER): Payer: BLUE CROSS/BLUE SHIELD | Admitting: Neurology

## 2015-03-17 ENCOUNTER — Telehealth: Payer: Self-pay

## 2015-03-17 VITALS — BP 168/92 | HR 88 | Resp 20 | Ht 62.0 in | Wt 280.0 lb

## 2015-03-17 DIAGNOSIS — R351 Nocturia: Secondary | ICD-10-CM | POA: Diagnosis not present

## 2015-03-17 DIAGNOSIS — G4733 Obstructive sleep apnea (adult) (pediatric): Secondary | ICD-10-CM

## 2015-03-17 DIAGNOSIS — G4719 Other hypersomnia: Secondary | ICD-10-CM | POA: Diagnosis not present

## 2015-03-17 DIAGNOSIS — R519 Headache, unspecified: Secondary | ICD-10-CM

## 2015-03-17 DIAGNOSIS — R51 Headache: Secondary | ICD-10-CM | POA: Diagnosis not present

## 2015-03-17 NOTE — Progress Notes (Signed)
Subjective:    Patient ID: [name] is a 39 y.o. female.  HPI     Star Age, MD, PhD [facility] [address], [address] [place], [place] [address]  Dear Dr. Tamala Julian,   I saw your patient, [name], upon your kind request in my neurologic clinic today for initial consultation of her sleep disorder, in particular, concern for underlying obstructive sleep apnea. The patient is unaccompanied today. As you know, [name] is a very friendly 39 year old right-handed woman with an underlying medical history of morbid obesity, depression, irregular menses with PCOS, hypertension, type 2 diabetes, and reflux disease, who reports snoring, and excessive daytime somnolence and weight gain despite trying to lose weight. She is in the process of investigating weight loss surgery.  She endorses loud snoring and gasping sensations and witness apneas, per husband. She has nocturia 1-4 times per night. She has occasional morning headaches.  She does not smoke, drinks alcohol occasionally and drinks soda occasionally, no coffee, no tea.  She has occasional L knee pain and had b/l CTS surgery. She has occasional leg cramps and no frank RLS symptoms or leg twitching in sleep. 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 She does not typically take a nap but sometimes when there is no children in the past she leans her head against a seat and may doze off for up to 10 minutes. She is not aware of any family history of obstructive sleep apnea.  Her typical bedtime is reported to be around 10 PM and usual wake time is around 5 AM. Sleep onset typically occurs within 1-3 hours. She reports feeling marginally rested upon awakening. Her Epworth Sleepiness Scale is 9 out of 24 and her fatigue score is 24 out of 63 today.  She lives with her husband, her 36 year old son, her 49 year old daughter, and her 61-year-old granddaughter. She  has 3 dogs and one parrots.   Her Past Medical History Is Significant For: Past Medical History  Diagnosis Date  . H. pylori infection     dx  09-02-2014-- treated w/ antibiotic  . GERD (gastroesophageal reflux disease)   . Type 2 diabetes mellitus   . Carpal tunnel syndrome, bilateral   . PCOS (polycystic ovarian syndrome)   . Menorrhagia   . Irregular menstrual cycle   . Hypertension   . Depression     Her Past Surgical History Is Significant For: Past Surgical History  Procedure Laterality Date  . Cesarean section  1995  . Carpal tunnel release Bilateral 10/07/2014    Procedure: CARPAL TUNNEL RELEASE BILATERAL;  Surgeon: [name], MD;  [facility];  Service: Orthopedics;  Laterality: Bilateral;    Her Family History Is Significant For: Family History  Problem Relation Age of Onset  . Heart disease Mother   . Diabetes Mother   . Hypertension Mother   . Cancer Father     Her Social History Is Significant For: History   Social History  . Marital Status: Married    Spouse Name: N/A  . Number of Children: 2  . Years of Education: N/A   Social History Main Topics  . Smoking status: Never Smoker   . Smokeless tobacco: Never Used  . Alcohol Use: 0.0 oz/week    0 Standard drinks or equivalent per week     Comment: socially  . Drug Use: No  . Sexual Activity:  Partners: Male    Museum/gallery curator: None   Other Topics Concern  . None   Social History Narrative   No reported caffeine use     Her Allergies Are:  Allergies  Allergen Reactions  . Peach [Prunus Persica] Anaphylaxis  :   Her Current Medications Are:  Outpatient Encounter Prescriptions as of 03/17/2015  Medication Sig  . chlorpheniramine-HYDROcodone (TUSSIONEX PENNKINETIC ER) 10-8 MG/5ML SUER Take 5 mLs by mouth 2 (two) times daily.  . clarithromycin (BIAXIN) 500 MG tablet Take 1 tablet (500 mg total) by mouth 2 (two) times daily.  Marland Kitchen FLUoxetine (PROZAC)  20 MG tablet Take 1 tablet (20 mg total) by mouth daily.  . fluticasone (FLONASE) 50 MCG/ACT nasal spray Place 2 sprays into both nostrils daily.  Marland Kitchen glimepiride (AMARYL) 4 MG tablet Take 2 tabs (8 mg) po twice daily.  . hydrochlorothiazide (HYDRODIURIL) 12.5 MG tablet Take 1 tablet (12.5 mg total) by mouth daily.  Marland Kitchen loratadine (CLARITIN) 10 MG tablet Take 1 tablet (10 mg total) by mouth daily.  . metFORMIN (GLUCOPHAGE) 500 MG tablet Take 2 tabs by mouth (1000 mg ) 2 times daily.  . [DISCONTINUED] benzonatate (TESSALON) 100 MG capsule Take 1 capsule (100 mg total) by mouth 3 (three) times daily as needed for cough. (Patient not taking: Reported on 03/14/2015)  . [DISCONTINUED] lisinopril (PRINIVIL,ZESTRIL) 20 MG tablet Take 1 tablet (20 mg total) by mouth daily.   No facility-administered encounter medications on file as of 03/17/2015.  :  Review of Systems:  Out of a complete 14 point review of systems, all are reviewed and negative with the exception of these symptoms as listed below:   Review of Systems  Constitutional: Positive for fatigue.       Weight gain   Eyes:       Blurred vision   Respiratory: Positive for cough and shortness of breath.   Cardiovascular: Positive for leg swelling.  Endocrine:       Feeling hot   Neurological:       Memory loss, Snoring, trouble going to sleep, wakes up many times during night, vivid nightmares, wakes up choking during the night, witnessed apnea, wakes up tired in the morning, denies taking naps during day.     Objective:  Neurologic Exam  Physical Exam Physical Examination:   Filed Vitals:   03/17/15 0955  BP: 168/92  Pulse: 88  Resp: 20   General Examination: The patient is a very pleasant 39 y.o. female in no acute distress. She appears well-developed and well-nourished and well groomed. She is obese.   HEENT: Normocephalic, atraumatic, pupils are equal, round and reactive to light and accommodation. Funduscopic exam is normal  with sharp disc margins noted. Extraocular tracking is good without limitation to gaze excursion or nystagmus noted. Normal smooth pursuit is noted. Hearing is grossly intact. Tympanic membranes are clear bilaterally. Face is symmetric with normal facial animation and normal facial sensation. Speech is clear with no dysarthria noted. There is no hypophonia. There is no lip, neck/head, jaw or voice tremor. Neck is supple with full range of passive and active motion. There are no carotid bruits on auscultation. Oropharynx exam reveals: mild mouth dryness, adequate dental hygiene and marked airway crowding, due to narrow airway entry, redundant soft palate and tonsils of 2+. Mallampati is class III. Tongue protrudes centrally and palate elevates symmetrically. Tonsils are Neck size is 18.25 inches. She has a mild overbite. Nasal inspection reveals no significant nasal mucosal bogginess or  redness and no septal deviation.   Chest: Clear to auscultation without wheezing, rhonchi or crackles noted.  Heart: S1+S2+0, regular and normal without murmurs, rubs or gallops noted.   Abdomen: Soft, non-tender and non-distended with normal bowel sounds appreciated on auscultation.  Extremities: There is no pitting edema in the distal lower extremities bilaterally. Pedal pulses are intact.  Skin: Warm and dry without trophic changes noted. There are no varicose veins.  Musculoskeletal: exam reveals no obvious joint deformities, tenderness or joint swelling or erythema.   Neurologically:  Mental status: The patient is awake, alert and oriented in all 4 spheres. Her immediate and remote memory, attention, language skills and fund of knowledge are appropriate. There is no evidence of aphasia, agnosia, apraxia or anomia. Speech is clear with normal prosody and enunciation. Thought process is linear. Mood is normal and affect is blunted.  Cranial nerves II - XII are as described above under HEENT exam. In addition:  shoulder shrug is normal with equal shoulder height noted. Motor exam: Normal bulk, strength and tone is noted. There is no drift, tremor or rebound. Romberg is negative. Reflexes are 2+ throughout. Babinski: Toes are flexor bilaterally. Fine motor skills and coordination: intact with normal finger taps, normal hand movements, normal rapid alternating patting, normal foot taps and normal foot agility.  Cerebellar testing: No dysmetria or intention tremor on finger to nose testing. Heel to shin is difficult for her due to body habitus. There is no truncal or gait ataxia.  Sensory exam: intact to light touch, pinprick, vibration, temperature sense in the upper and lower extremities.  Gait, station and balance: She stands easily. No veering to one side is noted. No leaning to one side is noted. Posture is age-appropriate and stance is narrow based. Gait shows normal stride length and normal pace. No problems turning are noted. She turns en bloc. Tandem walk is Difficult for her due to body habitus.      Assessment and Plan:   In summary, [name] is a very pleasant 39 y.o.-year old female with an underlying medical history of morbid obesity, depression, irregular menses with PCOS, hypertension, type 2 diabetes, and reflux disease, who reports snoring, and excessive daytime somnolence and weight gain despite trying to lose weight. with a history and physical exam indeed in keeping with obstructive sleep apnea (OSA). I had a long chat with the patient about my findings and the diagnosis of OSA, its prognosis and treatment options. We talked about medical treatments, surgical interventions and non-pharmacological approaches. I explained in particular the risks and ramifications of untreated moderate to severe OSA, especially with respect to developing cardiovascular disease down the Road, including congestive heart failure, difficult to treat hypertension, cardiac arrhythmias, or stroke. Even type 2  diabetes has, in part, been linked to untreated OSA. Symptoms of untreated OSA include daytime sleepiness, memory problems, mood irritability and mood disorder such as depression and anxiety, lack of energy, as well as recurrent headaches, especially morning headaches. We talked about  smoking cessation and trying to maintain a healthy lifestyle in general, as well as the importance of weight control. I encouraged the patient to eat healthy, exercise daily and keep well hydrated, to keep a scheduled bedtime and wake time routine, to not skip any meals and eat healthy snacks in between meals. I advised the patient not to drive when feeling sleepy. I recommended the following at this time: sleep study with potential positive airway pressure titration. (We will score hypopneas at 3%  and split the sleep study into diagnostic and treatment portion, if the estimated. 2 hour AHI is >15/h).   I explained the sleep test procedure to the patient and also outlined possible surgical and non-surgical treatment options of OSA, including the use of a custom-made dental device (which would require a referral to a specialist dentist or oral surgeon), upper airway surgical options, such as pillar implants, radiofrequency surgery, tongue base surgery, and UPPP (which would involve a referral to an ENT surgeon). Rarely, jaw surgery such as mandibular advancement may be considered.  I also explained the CPAP treatment option to the patient, who indicated that she would be willing to try CPAP if the need arises. I explained the importance of being compliant with PAP treatment, not only for insurance purposes but primarily to improve Her symptoms, and for the patient's long term health benefit, including to reduce Her cardiovascular risks. I answered all her questions today and the patient was in agreement. I would like to see her back after the sleep study is completed and encouraged her to call with any interim questions, concerns,  problems or updates.   Thank you very much for allowing me to participate in the care of this nice patient. If I can be of any further assistance to you please do not hesitate to call me at [phone].  Sincerely,   Star Age, MD, PhD

## 2015-03-17 NOTE — Telephone Encounter (Signed)
The patient brought in a 36 page email from [facility] regarding information that Dr. Tamala Julian will need to review regarding her gastric bypass surgery.  Per patient the insurance will pay for the procedure under the guidelines indicated in the 17 page printed email.  This form/email has been placed in the nurse's box at [address].  The patient may be reached at [phone].

## 2015-03-17 NOTE — Telephone Encounter (Signed)
Dr. Tamala Julian, I will place in your box.

## 2015-03-17 NOTE — Patient Instructions (Addendum)

## 2015-03-23 ENCOUNTER — Encounter: Payer: Self-pay | Admitting: Family Medicine

## 2015-03-23 ENCOUNTER — Ambulatory Visit (INDEPENDENT_AMBULATORY_CARE_PROVIDER_SITE_OTHER): Payer: BLUE CROSS/BLUE SHIELD | Admitting: Family Medicine

## 2015-03-23 VITALS — BP 169/103 | HR 94 | Temp 98.5°F | Resp 18 | Ht 62.0 in | Wt 279.0 lb

## 2015-03-23 DIAGNOSIS — R7989 Other specified abnormal findings of blood chemistry: Secondary | ICD-10-CM

## 2015-03-23 DIAGNOSIS — F329 Major depressive disorder, single episode, unspecified: Secondary | ICD-10-CM

## 2015-03-23 DIAGNOSIS — R312 Other microscopic hematuria: Secondary | ICD-10-CM

## 2015-03-23 DIAGNOSIS — F418 Other specified anxiety disorders: Secondary | ICD-10-CM | POA: Diagnosis not present

## 2015-03-23 DIAGNOSIS — R0683 Snoring: Secondary | ICD-10-CM

## 2015-03-23 DIAGNOSIS — R946 Abnormal results of thyroid function studies: Secondary | ICD-10-CM | POA: Diagnosis not present

## 2015-03-23 DIAGNOSIS — E1165 Type 2 diabetes mellitus with hyperglycemia: Secondary | ICD-10-CM

## 2015-03-23 DIAGNOSIS — F419 Anxiety disorder, unspecified: Secondary | ICD-10-CM

## 2015-03-23 DIAGNOSIS — F32A Depression, unspecified: Secondary | ICD-10-CM

## 2015-03-23 DIAGNOSIS — R945 Abnormal results of liver function studies: Principal | ICD-10-CM

## 2015-03-23 DIAGNOSIS — IMO0002 Reserved for concepts with insufficient information to code with codable children: Secondary | ICD-10-CM

## 2015-03-23 DIAGNOSIS — R3129 Other microscopic hematuria: Secondary | ICD-10-CM

## 2015-03-23 DIAGNOSIS — I10 Essential (primary) hypertension: Secondary | ICD-10-CM

## 2015-03-23 LAB — COMPREHENSIVE METABOLIC PANEL
ALBUMIN: 4 g/dL (ref 3.5–5.2)
ALT: 100 U/L — AB (ref 0–35)
AST: 63 U/L — ABNORMAL HIGH (ref 0–37)
Alkaline Phosphatase: 89 U/L (ref 39–117)
BUN: 12 mg/dL (ref 6–23)
CALCIUM: 9.2 mg/dL (ref 8.4–10.5)
CO2: 26 meq/L (ref 19–32)
CREATININE: 0.49 mg/dL — AB (ref 0.50–1.10)
Chloride: 100 mEq/L (ref 96–112)
Glucose, Bld: 178 mg/dL — ABNORMAL HIGH (ref 70–99)
POTASSIUM: 3.9 meq/L (ref 3.5–5.3)
Sodium: 137 mEq/L (ref 135–145)
Total Bilirubin: 0.3 mg/dL (ref 0.2–1.2)
Total Protein: 7.2 g/dL (ref 6.0–8.3)

## 2015-03-23 LAB — TSH: TSH: 0.395 u[IU]/mL (ref 0.350–4.500)

## 2015-03-23 MED ORDER — HYDROCHLOROTHIAZIDE 25 MG PO TABS: 25.0000 mg | ORAL_TABLET | Freq: Every day | ORAL | Status: DC

## 2015-03-23 MED ORDER — OMEPRAZOLE 20 MG PO CPDR: 20.0000 mg | DELAYED_RELEASE_CAPSULE | Freq: Every day | ORAL | Status: DC

## 2015-03-23 MED ORDER — METOPROLOL SUCCINATE ER 25 MG PO TB24: 25.0000 mg | ORAL_TABLET | Freq: Every day | ORAL | Status: DC

## 2015-03-23 MED ORDER — SITAGLIPTIN PHOSPHATE 100 MG PO TABS: 100.0000 mg | ORAL_TABLET | Freq: Every day | ORAL | Status: DC

## 2015-03-23 NOTE — Patient Instructions (Signed)
1.  JANUVIA 100MG  --- ONE TABLET FOR DIABETES 2.  METOPROLOL 25MG  -- ONE TABLET DAILY FOR BLOOD PRESSURE. 3.  HYDROCHLORATHIAZIDE 25MG  --- ONE TABLET DAILY FOR BLOOD PRESSURE. 4.  OMEPRAZOLE 20MG  --- ONE TABLET DAILY FOR HEARTBURN.

## 2015-03-23 NOTE — Progress Notes (Signed)
Subjective:    Patient ID: [name], female    DOB: August 04, 1976, 39 y.o.   MRN: [id number]  03/23/2015  Hypertension   HPI This 39 y.o. female presents for one month follow-up:  1. HTN:  Management changes made at last visit included adding HCTZ 12.5mg  daily;  Had been started on Lisinopril three days prior to last visit; currently not taking Lisinopril.  HCTZ caused headaches and itching in feet; having a lot of headaches.  Not taking Lisinopril; stopped this week.  Thinks taking HCTZ 12.5mg  daily.  2.  Anxiety and depression: management changes made at last visit include adding Prozac.  Feeling less irritable; not fighting with daughter as much.    3.  DMII: last HgbA1c of 9.0. Taking Metformin 500mg  two bid; Amaryl two daily.   Sugar 166 fasting; usually running 99-112.    4.  Hematuria microscopic: due for repeat u/a.  No vaginal bleeding.  No vaginal spotting ; no vaginal bleeding.    5.  Snoring: referred to sleep specialist; s/p consultation.  Scheduled for sleep study.  6. Morbid obesity:  Insurance pays for bariatric surgery; has not attended seminar for bariatric surgery.  Awaiting referral for bariatric surgery.    7. Thyroid function abnormal:  Repeat.    8. Elevated LFTs: s/p abdominal US in ED 02/24/2015; +fatty liver; no alcohol.    9. Constipation:  Having small bowel movements until this morning.  Ate sweet roll last night with raisins with milk.     Review of Systems  Constitutional: Negative for fever, chills, diaphoresis and fatigue.  Eyes: Negative for visual disturbance.  Respiratory: Negative for cough and shortness of breath.   Cardiovascular: Negative for chest pain, palpitations and leg swelling.  Gastrointestinal: Negative for nausea, vomiting, abdominal pain, diarrhea and constipation.  Endocrine: Negative for cold intolerance, heat intolerance, polydipsia, polyphagia and polyuria.  Neurological: Negative for dizziness, tremors, seizures,  syncope, facial asymmetry, speech difficulty, weakness, light-headedness, numbness and headaches.    Past Medical History  Diagnosis Date  . H. pylori infection     dx  09-02-2014-- treated w/ antibiotic  . GERD (gastroesophageal reflux disease)   . Type 2 diabetes mellitus   . Carpal tunnel syndrome, bilateral   . PCOS (polycystic ovarian syndrome)   . Menorrhagia   . Irregular menstrual cycle   . Hypertension   . Depression    Past Surgical History  Procedure Laterality Date  . Cesarean section  1995  . Carpal tunnel release Bilateral 10/07/2014    Procedure: CARPAL TUNNEL RELEASE BILATERAL;  Surgeon: [name], MD;  Location: [facility];  Service: Orthopedics;  Laterality: Bilateral;   Allergies  Allergen Reactions  . Peach [Prunus Persica] Anaphylaxis   Current Outpatient Prescriptions  Medication Sig Dispense Refill  . chlorpheniramine-HYDROcodone (TUSSIONEX PENNKINETIC ER) 10-8 MG/5ML SUER Take 5 mLs by mouth 2 (two) times daily. 60 mL 0  . clarithromycin (BIAXIN) 500 MG tablet Take 1 tablet (500 mg total) by mouth 2 (two) times daily. 20 tablet 0  . FLUoxetine (PROZAC) 20 MG tablet Take 1 tablet (20 mg total) by mouth daily. 30 tablet 3  . fluticasone (FLONASE) 50 MCG/ACT nasal spray Place 2 sprays into both nostrils daily. 16 g 12  . glimepiride (AMARYL) 4 MG tablet Take 2 tabs (8 mg) po twice daily. 120 tablet 3  . hydrochlorothiazide (HYDRODIURIL) 25 MG tablet Take 1 tablet (25 mg total) by mouth daily. 90 tablet 3  . loratadine (  CLARITIN) 10 MG tablet Take 1 tablet (10 mg total) by mouth daily. 30 tablet 11  . metFORMIN (GLUCOPHAGE) 500 MG tablet Take 2 tabs by mouth (1000 mg ) 2 times daily. 120 tablet 3  . omeprazole (PRILOSEC) 20 MG capsule Take 1 capsule (20 mg total) by mouth daily. 90 capsule 3  . sitaGLIPtin (JANUVIA) 100 MG tablet Take 1 tablet (100 mg total) by mouth daily. 90 tablet 3   No current facility-administered medications for  this visit.       Objective:    BP 169/103 mmHg  Pulse 94  Temp(Src) 98.5 F (36.9 C) (Oral)  Resp 18  Ht 5\' 2"  (1.575 m)  Wt 279 lb (126.554 kg)  BMI 51.02 kg/m2  SpO2 95%  LMP 02/27/2015 Physical Exam  Constitutional: She is oriented to person, place, and time. She appears well-developed and well-nourished. No distress.  HENT:  Head: Normocephalic and atraumatic.  Right Ear: External ear normal.  Left Ear: External ear normal.  Nose: Nose normal.  Mouth/Throat: Oropharynx is clear and moist.  Eyes: Conjunctivae and EOM are normal. Pupils are equal, round, and reactive to light.  Neck: Normal range of motion. Neck supple. Carotid bruit is not present. No thyromegaly present.  Cardiovascular: Normal rate, regular rhythm, normal heart sounds and intact distal pulses.  Exam reveals no gallop and no friction rub.   No murmur heard. Pulmonary/Chest: Effort normal and breath sounds normal. She has no wheezes. She has no rales.  Abdominal: Soft. Bowel sounds are normal. She exhibits no distension and no mass. There is no tenderness. There is no rebound and no guarding.  Lymphadenopathy:    She has no cervical adenopathy.  Neurological: She is alert and oriented to person, place, and time. No cranial nerve deficit.  Skin: Skin is warm and dry. No rash noted. She is not diaphoretic. No erythema. No pallor.  Psychiatric: She has a normal mood and affect. Her behavior is normal.   Results for orders placed or performed during the hospital encounter of 02/23/15  CBC with Differential  Result Value Ref Range   WBC 8.1 4.0 - 10.5 K/uL   RBC 4.95 3.87 - 5.11 MIL/uL   Hemoglobin 14.2 12.0 - 15.0 g/dL   HCT 41.6 36.0 - 46.0 %   MCV 84.0 78.0 - 100.0 fL   MCH 28.7 26.0 - 34.0 pg   MCHC 34.1 30.0 - 36.0 g/dL   RDW 12.8 11.5 - 15.5 %   Platelets 277 150 - 400 K/uL   Neutrophils Relative % 58 43 - 77 %   Neutro Abs 4.7 1.7 - 7.7 K/uL   Lymphocytes Relative 31 12 - 46 %   Lymphs Abs 2.5  0.7 - 4.0 K/uL   Monocytes Relative 8 3 - 12 %   Monocytes Absolute 0.7 0.1 - 1.0 K/uL   Eosinophils Relative 3 0 - 5 %   Eosinophils Absolute 0.2 0.0 - 0.7 K/uL   Basophils Relative 0 0 - 1 %   Basophils Absolute 0.0 0.0 - 0.1 K/uL  Comprehensive metabolic panel  Result Value Ref Range   Sodium 136 135 - 145 mmol/L   Potassium 3.5 3.5 - 5.1 mmol/L   Chloride 103 96 - 112 mmol/L   CO2 24 19 - 32 mmol/L   Glucose, Bld 136 (H) 70 - 99 mg/dL   BUN 10 6 - 23 mg/dL   Creatinine, Ser 0.56 0.50 - 1.10 mg/dL   Calcium 8.7 8.4 - 10.5 mg/dL  Total Protein 6.8 6.0 - 8.3 g/dL   Albumin 3.4 (L) 3.5 - 5.2 g/dL   AST 43 (H) 0 - 37 U/L   ALT 65 (H) 0 - 35 U/L   Alkaline Phosphatase 90 39 - 117 U/L   Total Bilirubin 0.7 0.3 - 1.2 mg/dL   GFR calc non Af Amer >90 >90 mL/min   GFR calc Af Amer >90 >90 mL/min   Anion gap 9 5 - 15  Lipase, blood  Result Value Ref Range   Lipase 18 11 - 59 U/L  Urinalysis, Routine w reflex microscopic  Result Value Ref Range   Color, Urine AMBER (A) YELLOW   APPearance CLOUDY (A) CLEAR   Specific Gravity, Urine 1.029 1.005 - 1.030   pH 5.0 5.0 - 8.0   Glucose, UA 100 (A) NEGATIVE mg/dL   Hgb urine dipstick LARGE (A) NEGATIVE   Bilirubin Urine SMALL (A) NEGATIVE   Ketones, ur 15 (A) NEGATIVE mg/dL   Protein, ur NEGATIVE NEGATIVE mg/dL   Urobilinogen, UA 1.0 0.0 - 1.0 mg/dL   Nitrite NEGATIVE NEGATIVE   Leukocytes, UA NEGATIVE NEGATIVE  Urine microscopic-add on  Result Value Ref Range   Squamous Epithelial / LPF FEW (A) RARE   WBC, UA 0-2 <3 WBC/hpf   RBC / HPF 21-50 <3 RBC/hpf   Bacteria, UA FEW (A) RARE  I-stat troponin, ED (only if pt is 39 y.o. or older & pain is above umbilicus) - do not order at [facility]  Result Value Ref Range   Troponin i, poc 0.00 0.00 - 0.08 ng/mL   Comment 3          POC Urine Pregnancy, ED  (If Pre-menopausal female) - do not order at [facility]  Result Value Ref Range   Preg Test, Ur NEGATIVE NEGATIVE       Assessment & Plan:    1. Elevated LFTs   2. Abnormal thyroid blood test   3. Hematuria, microscopic   4. Type II diabetes mellitus, uncontrolled   5. Severe obesity (BMI >= 40)   6. Anxiety and depression   7. Essential hypertension   8. Snoring     Meds ordered this encounter  Medications  . sitaGLIPtin (JANUVIA) 100 MG tablet    Sig: Take 1 tablet (100 mg total) by mouth daily.    Dispense:  90 tablet    Refill:  3  . hydrochlorothiazide (HYDRODIURIL) 25 MG tablet    Sig: Take 1 tablet (25 mg total) by mouth daily.    Dispense:  90 tablet    Refill:  3  . omeprazole (PRILOSEC) 20 MG capsule    Sig: Take 1 capsule (20 mg total) by mouth daily.    Dispense:  90 capsule    Refill:  3    No Follow-up on file.     Austin Pongratz Elayne Guerin, M.D. [facility] [address] [place], [place]  [address] [phone] phone [phone] fax

## 2015-03-24 LAB — CBC WITH DIFFERENTIAL/PLATELET
Basophils Absolute: 0.1 10*3/uL (ref 0.0–0.1)
Basophils Relative: 1 % (ref 0–1)
EOS ABS: 0.3 10*3/uL (ref 0.0–0.7)
Eosinophils Relative: 3 % (ref 0–5)
HCT: 43.1 % (ref 36.0–46.0)
HEMOGLOBIN: 14.2 g/dL (ref 12.0–15.0)
LYMPHS ABS: 3.8 10*3/uL (ref 0.7–4.0)
LYMPHS PCT: 36 % (ref 12–46)
MCH: 27.7 pg (ref 26.0–34.0)
MCHC: 32.9 g/dL (ref 30.0–36.0)
MCV: 84.2 fL (ref 78.0–100.0)
MONO ABS: 0.9 10*3/uL (ref 0.1–1.0)
MONOS PCT: 9 % (ref 3–12)
MPV: 9.8 fL (ref 8.6–12.4)
Neutro Abs: 5.4 10*3/uL (ref 1.7–7.7)
Neutrophils Relative %: 51 % (ref 43–77)
Platelets: 345 10*3/uL (ref 150–400)
RBC: 5.12 MIL/uL — ABNORMAL HIGH (ref 3.87–5.11)
RDW: 13.6 % (ref 11.5–15.5)
WBC: 10.5 10*3/uL (ref 4.0–10.5)

## 2015-03-28 ENCOUNTER — Telehealth: Payer: Self-pay

## 2015-03-28 NOTE — Telephone Encounter (Signed)
PA needed for Januvia. Completed on covermymeds. Pending.

## 2015-03-30 DIAGNOSIS — G4733 Obstructive sleep apnea (adult) (pediatric): Secondary | ICD-10-CM

## 2015-03-30 HISTORY — DX: Obstructive sleep apnea (adult) (pediatric): G47.33

## 2015-03-31 MED ORDER — SAXAGLIPTIN HCL 5 MG PO TABS: 5.0000 mg | ORAL_TABLET | Freq: Every day | ORAL | Status: DC

## 2015-03-31 NOTE — Telephone Encounter (Signed)
Notified pt of change. Pt agreed to try it.

## 2015-03-31 NOTE — Telephone Encounter (Signed)
PA was denied bc pt has not tried/failed preferred med Onglyza. Dr Tamala Julian, do you want to Rx Onglyza?

## 2015-03-31 NOTE — Telephone Encounter (Signed)
OK with Onglyza; sent rx to pharmacy; please advise pt.

## 2015-04-11 ENCOUNTER — Ambulatory Visit (INDEPENDENT_AMBULATORY_CARE_PROVIDER_SITE_OTHER): Payer: BLUE CROSS/BLUE SHIELD | Admitting: Neurology

## 2015-04-11 DIAGNOSIS — G479 Sleep disorder, unspecified: Secondary | ICD-10-CM

## 2015-04-11 DIAGNOSIS — G4733 Obstructive sleep apnea (adult) (pediatric): Secondary | ICD-10-CM | POA: Diagnosis not present

## 2015-04-11 DIAGNOSIS — G4710 Hypersomnia, unspecified: Secondary | ICD-10-CM

## 2015-04-11 DIAGNOSIS — G4730 Sleep apnea, unspecified: Secondary | ICD-10-CM

## 2015-04-11 NOTE — Sleep Study (Signed)
Please see the scanned sleep study interpretation located in the Procedure tab within the Chart Review section. 

## 2015-04-12 ENCOUNTER — Telehealth: Payer: Self-pay | Admitting: Neurology

## 2015-04-12 DIAGNOSIS — G4733 Obstructive sleep apnea (adult) (pediatric): Secondary | ICD-10-CM

## 2015-04-12 NOTE — Telephone Encounter (Signed)
Patient called returning Sandra's call. Please call and advise. Patient can be reached at [phone].

## 2015-04-12 NOTE — Telephone Encounter (Signed)
Patient seen on 03/17/15, split night sleep study on 04/11/15. Ins: [employer]  Diana:   Please call and notify patient that the recent sleep study confirmed the diagnosis of severe OSA. She did well with CPAP during the study with significant improvement of the respiratory events. Therefore, I would like start the patient on CPAP therapy at home by prescribing a machine for home use. I placed the order in the chart. The patient will need a follow up appointment with me in 8 to 10 weeks post set up that has to be scheduled; please go ahead and schedule while you have the patient on the phone and make sure patient understands the importance of keeping this window for the FU appointment, as it is often an insurance requirement and failing to adhere to this may result in losing coverage for sleep apnea treatment. 15 min follow-up should suffice, unless there is a 30 min FU slot available.  Please re-enforce the importance of compliance with treatment and the need for [place] to monitor compliance data - again an insurance requirement and good feedback for the patient as far as how they are doing.  Also remind patient, that any upcoming CPAP machine or mask issues, should be first addressed with the DME company. Please ask if patient has a preference regarding DME company.  Please arrange for CPAP set up at home through a DME company of patient's choice - once you have spoken to the patient - and faxed/routed report to PCP and referring MD (if other than PCP), you can close this encounter, thanks,   Star Age, MD, PhD [facility] ([place])

## 2015-04-12 NOTE — Telephone Encounter (Signed)
LMVM for pt to return call for test results (on study you had done in the office).

## 2015-04-12 NOTE — Telephone Encounter (Signed)
Call patient --- I have reviewed the 17 page document from [facility].  Patient meets criteria for bariatric surgery.  I have placed referral to [facility] for consultation if patient is agreeable to undergoing procedure in [place]. Has patient attended the free seminar as we discussed at her visit on 03-23-15?  She will need to undergo an extensive pre-operative evaluation that the Bariatric Clinic coordinates after her initial consultation with the bariatric surgeon.

## 2015-04-12 NOTE — Telephone Encounter (Signed)
I called pt and gave her the results of sleep study.   She will [place] [facility] for cpap.  I faxed to [facility] and her pcp (Dr. Reginia Forts) the sleep study report and recommendations.  Pt will be moving in a week.  Call [phone].

## 2015-04-12 NOTE — Telephone Encounter (Signed)
Gave pt message. Pt is going to free seminar tonight.

## 2015-04-12 NOTE — Telephone Encounter (Signed)
lmom to cb. 

## 2015-04-13 NOTE — Telephone Encounter (Signed)
I called pt back and made f/u appt (sleep results/ cpap) 06-22-15 at 1530.  She verbalized understanding.

## 2015-04-22 ENCOUNTER — Encounter: Payer: Self-pay | Admitting: Family Medicine

## 2015-04-22 ENCOUNTER — Ambulatory Visit (INDEPENDENT_AMBULATORY_CARE_PROVIDER_SITE_OTHER): Payer: BLUE CROSS/BLUE SHIELD | Admitting: Family Medicine

## 2015-04-22 VITALS — BP 190/126 | HR 109 | Temp 98.1°F | Resp 16 | Ht 62.2 in | Wt 278.4 lb

## 2015-04-22 DIAGNOSIS — E119 Type 2 diabetes mellitus without complications: Secondary | ICD-10-CM | POA: Diagnosis not present

## 2015-04-22 DIAGNOSIS — R7989 Other specified abnormal findings of blood chemistry: Secondary | ICD-10-CM | POA: Diagnosis not present

## 2015-04-22 DIAGNOSIS — J301 Allergic rhinitis due to pollen: Secondary | ICD-10-CM | POA: Diagnosis not present

## 2015-04-22 DIAGNOSIS — K219 Gastro-esophageal reflux disease without esophagitis: Secondary | ICD-10-CM

## 2015-04-22 DIAGNOSIS — R945 Abnormal results of liver function studies: Principal | ICD-10-CM

## 2015-04-22 DIAGNOSIS — G4733 Obstructive sleep apnea (adult) (pediatric): Secondary | ICD-10-CM | POA: Diagnosis not present

## 2015-04-22 DIAGNOSIS — F329 Major depressive disorder, single episode, unspecified: Secondary | ICD-10-CM

## 2015-04-22 DIAGNOSIS — F418 Other specified anxiety disorders: Secondary | ICD-10-CM | POA: Diagnosis not present

## 2015-04-22 DIAGNOSIS — F419 Anxiety disorder, unspecified: Secondary | ICD-10-CM

## 2015-04-22 DIAGNOSIS — I10 Essential (primary) hypertension: Secondary | ICD-10-CM | POA: Diagnosis not present

## 2015-04-22 DIAGNOSIS — F32A Depression, unspecified: Secondary | ICD-10-CM

## 2015-04-22 LAB — COMPREHENSIVE METABOLIC PANEL
ALBUMIN: 3.9 g/dL (ref 3.5–5.2)
ALT: 75 U/L — ABNORMAL HIGH (ref 0–35)
AST: 46 U/L — AB (ref 0–37)
Alkaline Phosphatase: 98 U/L (ref 39–117)
BUN: 14 mg/dL (ref 6–23)
CHLORIDE: 101 meq/L (ref 96–112)
CO2: 25 meq/L (ref 19–32)
Calcium: 8.8 mg/dL (ref 8.4–10.5)
Creat: 0.68 mg/dL (ref 0.50–1.10)
Glucose, Bld: 401 mg/dL — ABNORMAL HIGH (ref 70–99)
POTASSIUM: 4.2 meq/L (ref 3.5–5.3)
Sodium: 136 mEq/L (ref 135–145)
Total Bilirubin: 0.3 mg/dL (ref 0.2–1.2)
Total Protein: 7 g/dL (ref 6.0–8.3)

## 2015-04-22 LAB — CBC WITH DIFFERENTIAL/PLATELET
BASOS PCT: 0 % (ref 0–1)
Basophils Absolute: 0 10*3/uL (ref 0.0–0.1)
Eosinophils Absolute: 0.2 10*3/uL (ref 0.0–0.7)
Eosinophils Relative: 2 % (ref 0–5)
HCT: 42.8 % (ref 36.0–46.0)
HEMOGLOBIN: 14.3 g/dL (ref 12.0–15.0)
Lymphocytes Relative: 37 % (ref 12–46)
Lymphs Abs: 2.8 10*3/uL (ref 0.7–4.0)
MCH: 28.2 pg (ref 26.0–34.0)
MCHC: 33.4 g/dL (ref 30.0–36.0)
MCV: 84.4 fL (ref 78.0–100.0)
MPV: 10.1 fL (ref 8.6–12.4)
Monocytes Absolute: 0.5 10*3/uL (ref 0.1–1.0)
Monocytes Relative: 6 % (ref 3–12)
NEUTROS PCT: 55 % (ref 43–77)
Neutro Abs: 4.1 10*3/uL (ref 1.7–7.7)
PLATELETS: 289 10*3/uL (ref 150–400)
RBC: 5.07 MIL/uL (ref 3.87–5.11)
RDW: 13.3 % (ref 11.5–15.5)
WBC: 7.5 10*3/uL (ref 4.0–10.5)

## 2015-04-22 LAB — HEPATITIS PANEL, ACUTE
HCV AB: NEGATIVE
HEP A IGM: NONREACTIVE
HEP B S AG: NEGATIVE
Hep B C IgM: NONREACTIVE

## 2015-04-22 MED ORDER — FLUOXETINE HCL 20 MG PO TABS: 40.0000 mg | ORAL_TABLET | Freq: Every day | ORAL | Status: DC

## 2015-04-22 NOTE — Progress Notes (Signed)
Subjective:    Patient ID: [name], female    DOB: [birth year], 39 y.o.   MRN: [id number]  04/22/2015  1 month follow up weight   HPI This 39 y.o. female presents for one month follow-up:  1. HTN: take HCTZ 86m daily; Metoprolol 241mdaily.  Not [facility] home; [facility] Will go up when gets angry.    2. Elevated LFTs:  S/p abdominal usKorean 02/24/15; +fatty liver.    3.  DMII:  Insurance did not approve Januvia.  Started Onglyza after last visit. Metformin 2 bid. Glimeperide 2 at breakfast.  4.  Obesity:  S/p bariatric class on line and attended 04/12/15.  Passed test on line.  Provided with insurance card.  Asked questions; took daughter with pt to webinar.  Saw some patients with surgery; interested in sleeve.  If undergoes bypass, medication will pass through.  Not interested in band.  Recommended six months of weight loss attempts.  Needs referral from PCP.   B:  [employer] muffin, sandwich, water. Snack: none Lunch:  [employer]; water, eggs, bacon, potatoe Snack: none Supper:  Beans with toasted torilla one big, water.   5. GERD:  Added Omeprazole 2047maily.    6.  Anxiety and depression:  Sleeping too much. Not crying.  Patient reports good compliance with medication, good tolerance to medication, and good symptom control.  Excessive sleep.   7. Allergic Rhinitis: taking Flonase daily; stopped Claritin.  8. OSA: s/p sleep study; fitted for CPAP.  Excessive daytime sleepiness.  Has been sleeping more since study.   Review of Systems  Past Medical History  Diagnosis Date  . H. pylori infection     dx  09-02-2014-- treated w/ antibiotic  . GERD (gastroesophageal reflux disease)   . Type 2 diabetes mellitus   . Carpal tunnel syndrome, bilateral   . PCOS (polycystic ovarian syndrome)   . Menorrhagia   . Irregular menstrual cycle   . Hypertension   . Depression    Past Surgical History  Procedure Laterality Date  . Cesarean section   1995  . Carpal tunnel release Bilateral 10/07/2014    Procedure: CARPAL TUNNEL RELEASE BILATERAL;  Surgeon: FreLinna HoffD;  [facility]  Laterality: Bilateral;   Allergies  Allergen Reactions  . Peach [Prunus Persica] Anaphylaxis   Current Outpatient Prescriptions  Medication Sig Dispense Refill  . FLUoxetine (PROZAC) 20 MG tablet Take 2 tablets (40 mg total) by mouth daily. 60 tablet 5  . fluticasone (FLONASE) 50 MCG/ACT nasal spray Place 2 sprays into both nostrils daily. 16 g 12  . glimepiride (AMARYL) 4 MG tablet Take 2 tabs (8 mg) po twice daily. 120 tablet 3  . hydrochlorothiazide (HYDRODIURIL) 25 MG tablet Take 1 tablet (25 mg total) by mouth daily. 90 tablet 3  . metFORMIN (GLUCOPHAGE) 500 MG tablet Take 2 tabs by mouth (1000 mg ) 2 times daily. 120 tablet 3  . metoprolol succinate (TOPROL-XL) 25 MG 24 hr tablet Take 1 tablet (25 mg total) by mouth daily. 90 tablet 0  . omeprazole (PRILOSEC) 20 MG capsule Take 1 capsule (20 mg total) by mouth daily. 90 capsule 3  . saxagliptin HCl (ONGLYZA) 5 MG TABS tablet Take 1 tablet (5 mg total) by mouth daily. 30 tablet 11   No current facility-administered medications for this visit.       Objective:    BP 190/126 mmHg  Pulse 109  Temp(Src) 98.1 F (36.7 C) (Oral)  Resp 16  Ht 5' 2.2" (1.58 m)  Wt 278 lb 6.4 oz (126.281 kg)  BMI 50.59 kg/m2  SpO2 100% Physical Exam Results for orders placed or performed in visit on 04/22/15  CBC with Differential/Platelet  Result Value Ref Range   WBC 7.5 4.0 - 10.5 K/uL   RBC 5.07 3.87 - 5.11 MIL/uL   Hemoglobin 14.3 12.0 - 15.0 g/dL   HCT 42.8 36.0 - 46.0 %   MCV 84.4 78.0 - 100.0 fL   MCH 28.2 26.0 - 34.0 pg   MCHC 33.4 30.0 - 36.0 g/dL   RDW 13.3 11.5 - 15.5 %   Platelets 289 150 - 400 K/uL   MPV 10.1 8.6 - 12.4 fL   Neutrophils Relative % 55 43 - 77 %   Neutro Abs 4.1 1.7 - 7.7 K/uL   Lymphocytes Relative 37 12 - 46 %   Lymphs  Abs 2.8 0.7 - 4.0 K/uL   Monocytes Relative 6 3 - 12 %   Monocytes Absolute 0.5 0.1 - 1.0 K/uL   Eosinophils Relative 2 0 - 5 %   Eosinophils Absolute 0.2 0.0 - 0.7 K/uL   Basophils Relative 0 0 - 1 %   Basophils Absolute 0.0 0.0 - 0.1 K/uL   Smear Review Criteria for review not met   Comprehensive metabolic panel  Result Value Ref Range   Sodium 136 135 - 145 mEq/L   Potassium 4.2 3.5 - 5.3 mEq/L   Chloride 101 96 - 112 mEq/L   CO2 25 19 - 32 mEq/L   Glucose, Bld 401 (H) 70 - 99 mg/dL   BUN 14 6 - 23 mg/dL   Creat 0.68 0.50 - 1.10 mg/dL   Total Bilirubin 0.3 0.2 - 1.2 mg/dL   Alkaline Phosphatase 98 39 - 117 U/L   AST 46 (H) 0 - 37 U/L   ALT 75 (H) 0 - 35 U/L   Total Protein 7.0 6.0 - 8.3 g/dL   Albumin 3.9 3.5 - 5.2 g/dL   Calcium 8.8 8.4 - 10.5 mg/dL  Hepatitis panel, acute  Result Value Ref Range   Hepatitis B Surface Ag NEGATIVE NEGATIVE   HCV Ab NEGATIVE NEGATIVE   Hep B C IgM NON REACTIVE NON REACTIVE   Hep A IgM NON REACTIVE NON REACTIVE       Assessment & Plan:   1. Elevated LFTs   2. Essential hypertension   3. Morbid obesity   4. Type 2 diabetes mellitus without complication   5. Gastroesophageal reflux disease without esophagitis   6. Anxiety and depression   7. Allergic rhinitis due to pollen   8. OSA (obstructive sleep apnea)     Meds ordered this encounter  Medications  . FLUoxetine (PROZAC) 20 MG tablet    Sig: Take 2 tablets (40 mg total) by mouth daily.    Dispense:  60 tablet    Refill:  5    Return in about 4 weeks (around 05/20/2015) for recheck.    Javious Hallisey Elayne Guerin, M.D. [facility] [address] [place], [place]  [address] [phone] phone [phone] fax

## 2015-05-08 ENCOUNTER — Encounter (HOSPITAL_COMMUNITY): Payer: Self-pay | Admitting: Emergency Medicine

## 2015-05-08 ENCOUNTER — Emergency Department (HOSPITAL_COMMUNITY)
Admission: EM | Admit: 2015-05-08 | Discharge: 2015-05-09 | Disposition: A | Discharge status: Home / Self Care | Payer: BLUE CROSS/BLUE SHIELD | Attending: Emergency Medicine | Admitting: Emergency Medicine

## 2015-05-08 DIAGNOSIS — Z8742 Personal history of other diseases of the female genital tract: Secondary | ICD-10-CM | POA: Insufficient documentation

## 2015-05-08 DIAGNOSIS — T7840XA Allergy, unspecified, initial encounter: Secondary | ICD-10-CM | POA: Insufficient documentation

## 2015-05-08 DIAGNOSIS — L500 Allergic urticaria: Secondary | ICD-10-CM | POA: Diagnosis not present

## 2015-05-08 DIAGNOSIS — I10 Essential (primary) hypertension: Secondary | ICD-10-CM | POA: Diagnosis not present

## 2015-05-08 DIAGNOSIS — X58XXXA Exposure to other specified factors, initial encounter: Secondary | ICD-10-CM | POA: Diagnosis not present

## 2015-05-08 DIAGNOSIS — Z79899 Other long term (current) drug therapy: Secondary | ICD-10-CM | POA: Insufficient documentation

## 2015-05-08 DIAGNOSIS — Y9289 Other specified places as the place of occurrence of the external cause: Secondary | ICD-10-CM | POA: Insufficient documentation

## 2015-05-08 DIAGNOSIS — K219 Gastro-esophageal reflux disease without esophagitis: Secondary | ICD-10-CM | POA: Diagnosis not present

## 2015-05-08 DIAGNOSIS — Y998 Other external cause status: Secondary | ICD-10-CM | POA: Insufficient documentation

## 2015-05-08 DIAGNOSIS — Y9389 Activity, other specified: Secondary | ICD-10-CM | POA: Insufficient documentation

## 2015-05-08 DIAGNOSIS — Z7951 Long term (current) use of inhaled steroids: Secondary | ICD-10-CM | POA: Diagnosis not present

## 2015-05-08 DIAGNOSIS — Z8619 Personal history of other infectious and parasitic diseases: Secondary | ICD-10-CM | POA: Insufficient documentation

## 2015-05-08 DIAGNOSIS — F329 Major depressive disorder, single episode, unspecified: Secondary | ICD-10-CM | POA: Diagnosis not present

## 2015-05-08 DIAGNOSIS — E119 Type 2 diabetes mellitus without complications: Secondary | ICD-10-CM | POA: Insufficient documentation

## 2015-05-08 LAB — CBC WITH DIFFERENTIAL/PLATELET
BASOS ABS: 0 10*3/uL (ref 0.0–0.1)
Basophils Relative: 0 % (ref 0–1)
EOS PCT: 2 % (ref 0–5)
Eosinophils Absolute: 0.2 10*3/uL (ref 0.0–0.7)
HEMATOCRIT: 43.3 % (ref 36.0–46.0)
Hemoglobin: 14.4 g/dL (ref 12.0–15.0)
LYMPHS ABS: 4.7 10*3/uL — AB (ref 0.7–4.0)
LYMPHS PCT: 47 % — AB (ref 12–46)
MCH: 28.2 pg (ref 26.0–34.0)
MCHC: 33.3 g/dL (ref 30.0–36.0)
MCV: 84.7 fL (ref 78.0–100.0)
MONO ABS: 0.8 10*3/uL (ref 0.1–1.0)
Monocytes Relative: 7 % (ref 3–12)
Neutro Abs: 4.5 10*3/uL (ref 1.7–7.7)
Neutrophils Relative %: 44 % (ref 43–77)
Platelets: 298 10*3/uL (ref 150–400)
RBC: 5.11 MIL/uL (ref 3.87–5.11)
RDW: 12.9 % (ref 11.5–15.5)
WBC: 10.2 10*3/uL (ref 4.0–10.5)

## 2015-05-08 LAB — BASIC METABOLIC PANEL
Anion gap: 9 (ref 5–15)
BUN: 13 mg/dL (ref 6–20)
CO2: 26 mmol/L (ref 22–32)
CREATININE: 0.85 mg/dL (ref 0.44–1.00)
Calcium: 9 mg/dL (ref 8.9–10.3)
Chloride: 102 mmol/L (ref 101–111)
GFR calc Af Amer: >60 mL/min (ref >60)
GFR calc non Af Amer: >60 mL/min (ref >60)
Glucose, Bld: 261 mg/dL — ABNORMAL HIGH (ref 65–99)
Potassium: 3.7 mmol/L (ref 3.5–5.1)
SODIUM: 137 mmol/L (ref 135–145)

## 2015-05-08 MED ORDER — FAMOTIDINE IN NACL 20-0.9 MG/50ML-% IV SOLN
20.0000 mg | Freq: Once | INTRAVENOUS | Status: AC
  Administered 2015-05-08: 20 mg via INTRAVENOUS
  Filled 2015-05-08: qty 50

## 2015-05-08 MED ORDER — EPINEPHRINE 0.3 MG/0.3ML IJ SOAJ
0.3000 mg | Freq: Once | INTRAMUSCULAR | Status: AC
  Administered 2015-05-08: 0.3 mg via INTRAMUSCULAR

## 2015-05-08 MED ORDER — EPINEPHRINE 0.3 MG/0.3ML IJ SOAJ
INTRAMUSCULAR | Status: AC
  Filled 2015-05-08: qty 0.3

## 2015-05-08 MED ORDER — DIPHENHYDRAMINE HCL 50 MG/ML IJ SOLN
50.0000 mg | Freq: Once | INTRAMUSCULAR | Status: AC
  Administered 2015-05-08: 50 mg via INTRAVENOUS
  Filled 2015-05-08: qty 1

## 2015-05-08 MED ORDER — METHYLPREDNISOLONE SODIUM SUCC 125 MG IJ SOLR
125.0000 mg | Freq: Once | INTRAMUSCULAR | Status: AC
  Administered 2015-05-08: 125 mg via INTRAVENOUS
  Filled 2015-05-08: qty 2

## 2015-05-08 NOTE — ED Notes (Addendum)
C/o hives all over and itching that started 2 hours ago.  States symptoms started 1 hour after eating fish and shrimp.  Also reports sob. Pt scratching bilateral breast and laughing at this time.

## 2015-05-08 NOTE — ED Notes (Addendum)
Pt stated, "I cant breathe" and said she was SOB and is requesting oxygen supplementation. O2-97% and RR-WDL. Pt placed on 2L [place] for comfort.

## 2015-05-08 NOTE — ED Provider Notes (Signed)
CSN: [id number]     Arrival date & time 05/08/15  2106 History   First MD Initiated Contact with Patient 05/08/15 2129     Chief Complaint  Patient presents with  . Allergic Reaction     (Consider location/radiation/quality/duration/timing/severity/associated sxs/prior Treatment) HPI Comments: Patient presents to the ER for evaluation of acute allergic reaction. Patient reports that she started having diffuse hives, itching and shortness of breath 2 hours ago after eating shrimp. She has never had a reaction to shellfish previously. There is no tongue or throat swelling.  Patient is a 39 y.o. female presenting with allergic reaction.  Allergic Reaction Presenting symptoms: rash     Past Medical History  Diagnosis Date  . H. pylori infection     dx  09-02-2014-- treated w/ antibiotic  . GERD (gastroesophageal reflux disease)   . Type 2 diabetes mellitus   . Carpal tunnel syndrome, bilateral   . PCOS (polycystic ovarian syndrome)   . Menorrhagia   . Irregular menstrual cycle   . Hypertension   . Depression    Past Surgical History  Procedure Laterality Date  . Cesarean section  1995  . Carpal tunnel release Bilateral 10/07/2014    Procedure: CARPAL TUNNEL RELEASE BILATERAL;  Surgeon: [name], MD;  Location: [facility];  Service: Orthopedics;  Laterality: Bilateral;   Family History  Problem Relation Age of Onset  . Heart disease Mother   . Diabetes Mother   . Hypertension Mother   . Cancer Father    History  Substance Use Topics  . Smoking status: Never Smoker   . Smokeless tobacco: Never Used  . Alcohol Use: 0.0 oz/week    0 Standard drinks or equivalent per week     Comment: socially   OB History    Gravida Para Term Preterm AB TAB SAB Ectopic Multiple Living   2 2 1 1      2      Review of Systems  Respiratory: Positive for shortness of breath.   Skin: Positive for rash.  All other systems reviewed and are negative.     Allergies   Peach  Home Medications   Prior to Admission medications   Medication Sig Start Date End Date Taking? Authorizing Provider  FLUoxetine (PROZAC) 20 MG tablet Take 2 tablets (40 mg total) by mouth daily. 04/22/15   Wardell Honour, MD  fluticasone (FLONASE) 50 MCG/ACT nasal spray Place 2 sprays into both nostrils daily. 01/15/15   Ezekiel Slocumb, PA-C  glimepiride (AMARYL) 4 MG tablet Take 2 tabs (8 mg) po twice daily. 01/15/15   Ezekiel Slocumb, PA-C  hydrochlorothiazide (HYDRODIURIL) 25 MG tablet Take 1 tablet (25 mg total) by mouth daily. 03/23/15   Wardell Honour, MD  metFORMIN (GLUCOPHAGE) 500 MG tablet Take 2 tabs by mouth (1000 mg ) 2 times daily. 01/15/15   Ezekiel Slocumb, PA-C  metoprolol succinate (TOPROL-XL) 25 MG 24 hr tablet Take 1 tablet (25 mg total) by mouth daily. 03/23/15   Wardell Honour, MD  omeprazole (PRILOSEC) 20 MG capsule Take 1 capsule (20 mg total) by mouth daily. 03/23/15   Wardell Honour, MD  saxagliptin HCl (ONGLYZA) 5 MG TABS tablet Take 1 tablet (5 mg total) by mouth daily. 03/31/15   Wardell Honour, MD   BP 135/69 mmHg  Pulse 86  Temp(Src) 97.9 F (36.6 C) (Oral)  Resp 15  Ht 5\' 2"  (1.575 m)  Wt 278 lb (126.1 kg)  BMI 50.83 kg/m2  SpO2 95% Physical Exam  Constitutional: She is oriented to person, place, and time. She appears well-developed and well-nourished. She appears distressed.  HENT:  Head: Normocephalic and atraumatic.  Right Ear: Hearing normal.  Left Ear: Hearing normal.  Nose: Nose normal.  Mouth/Throat: Oropharynx is clear and moist and mucous membranes are normal.  Eyes: Conjunctivae and EOM are normal. Pupils are equal, round, and reactive to light.  Neck: Normal range of motion. Neck supple.  Cardiovascular: Regular rhythm, S1 normal and S2 normal.  Exam reveals no gallop and no friction rub.   No murmur heard. Pulmonary/Chest: Effort normal and breath sounds normal. No respiratory distress. She exhibits no tenderness.  Abdominal: Soft. Normal  appearance and bowel sounds are normal. There is no hepatosplenomegaly. There is no tenderness. There is no rebound, no guarding, no tenderness at McBurney's point and negative Murphy's sign. No hernia.  Musculoskeletal: Normal range of motion.  Neurological: She is alert and oriented to person, place, and time. She has normal strength. No cranial nerve deficit or sensory deficit. Coordination normal. GCS eye subscore is 4. GCS verbal subscore is 5. GCS motor subscore is 6.  Skin: Skin is warm, dry and intact. No rash noted. There is erythema (Diffuse erythema associated with scattered urticaria). No cyanosis.  Psychiatric: She has a normal mood and affect. Her speech is normal and behavior is normal. Thought content normal.  Nursing note and vitals reviewed.   ED Course  Procedures (including critical care time) Labs Review Labs Reviewed  CBC WITH DIFFERENTIAL/PLATELET - Abnormal; Notable for the following:    Lymphocytes Relative 47 (*)    Lymphs Abs 4.7 (*)    All other components within normal limits  BASIC METABOLIC PANEL - Abnormal; Notable for the following:    Glucose, Bld 261 (*)    All other components within normal limits    Imaging Review No results found.   EKG Interpretation None      MDM   Final diagnoses:  None  Acute allergic reaction  Patient presents to the ER for evaluation of acute allergic reaction. Patient developed diffuse urticaria, erythroderma, shortness of breath after eating shrimp. She was administered Benadryl, Solu-Medrol, epinephrine. She was monitored in the ER for 3 hours and continues to do well. All symptoms have resolved. Will be discharged with prednisone taper, Benadryl. Return if symptoms worsen. Prescription for EpiPen provided. Patient counseled to watch blood sugars while on prednisone.    Orpah Greek, MD 05/09/15 [phone]

## 2015-05-09 MED ORDER — EPINEPHRINE 0.3 MG/0.3ML IJ SOAJ: 0.3000 mg | Freq: Once | INTRAMUSCULAR | Status: DC

## 2015-05-09 MED ORDER — PREDNISONE 20 MG PO TABS: ORAL_TABLET | ORAL | Status: DC

## 2015-05-09 NOTE — ED Notes (Signed)
Vomited x 1

## 2015-05-09 NOTE — Discharge Instructions (Signed)
Avoid Shrimp. Take Benadryl 1 or 2 tablets every 6 hours for next 2 days for itching.  Food Allergy A food allergy occurs from eating something you are sensitive to. Food allergies occur in all age groups. It may be passed to you from your parents (heredity).  CAUSES  Some common causes are cow's milk, seafood, eggs, nuts (including peanut butter), wheat, and soybeans. SYMPTOMS  Common problems are:   Swelling around the mouth.  An itchy, red rash.  Hives.  Vomiting.  Diarrhea. Severe allergic reactions are life-threatening. This reaction is called anaphylaxis. It can cause the mouth and throat to swell. This makes it hard to breathe and swallow. In severe reactions, only a small amount of food may be fatal within seconds. HOME CARE INSTRUCTIONS   If you are unsure what caused the reaction, keep a diary of foods eaten and symptoms that followed. Avoid foods that cause reactions.  If hives or rash are present:  Take medicines as directed.  Use an over-the-counter antihistamine (diphenhydramine) to treat hives and itching as needed.  Apply cold compresses to the skin or take baths in cool water. Avoid hot baths or showers. These will increase the redness and itching.  If you are severely allergic:  Hospitalization is often required following a severe reaction.  Wear a medical alert bracelet or necklace that describes the allergy.  Carry your anaphylaxis kit or epinephrine injection with you at all times. Both you and your family members should know how to use this. This can be lifesaving if you have a severe reaction. If epinephrine is used, it is important for you to seek immediate medical care or call your local emergency services (911 in [place] When the epinephrine wears off, it can be followed by a delayed reaction, which can be fatal.  Replace your epinephrine immediately after use in case of another reaction.  Ask your caregiver for instructions if you have not been  taught how to use an epinephrine injection.  Do not drive until medicines used to treat the reaction have worn off, unless approved by your caregiver. SEEK MEDICAL CARE IF:   You suspect a food allergy. Symptoms generally happen within 30 minutes of eating a food.  Your symptoms have not gone away within 2 days. See your caregiver sooner if symptoms are getting worse.  You develop new symptoms.  You want to retest yourself with a food or drink you think causes an allergic reaction. Never do this if an anaphylactic reaction to that food or drink has happened before.  There is a return of the symptoms which brought you to your caregiver. SEEK IMMEDIATE MEDICAL CARE IF:   You have trouble breathing, are wheezing, or you have a tight feeling in your chest or throat.  You have a swollen mouth, or you have hives, swelling, or itching all over your body. Use your epinephrine injection immediately. This is given into the outside of your thigh, deep into the muscle. Following use of the epinephrine injection, seek help right away. Seek immediate medical care or call your local emergency services (911 in [place] MAKE SURE YOU:   Understand these instructions.  Will watch your condition.  Will get help right away if you are not doing well or get worse. Document Released: 10/12/2000 Document Revised: 01/07/2012 Document Reviewed: 06/03/2008 [facility] Patient Information 2015 [place], [place]. This information is not intended to replace advice given to you by your health care provider. Make sure you discuss any questions you have with  your health care provider. Lyndel Safe (Food Allergy) Las Company secretary se producen al comer algo hacia lo cual se tiene sensibilidad. Las alergias alimentarias pueden ocurrir a Hotel manager. Pueden ser transmitidas por los padres (hereditarias).  CAUSAS Algunos de los alimentos que comnmente producen alergia son la [place] de [employer], los frutos de mar,  los [place], los frutos secos (incluyendo la [place] de man), el trigo y la soja. SNTOMAS Los problemas ms frecuentes son  Hinchazn alrededor de la boca.  Una erupcin roja que produce picazn.  Ronchas.  Vmitos.  Diarrea. Las Chief of Staff graves ponen en peligro la vida. Esta reaccin se denomina anafilaxis. Puede causar hinchazn en la boca y Patent examiner. Esto dificulta la respiracin y la deglucin. En casos de reacciones graves, slo una pequea cantidad de comida puede ser mortal en unos pocos segundos. INSTRUCCIONES PARA EL CUIDADO DOMICILIARIO  Si no est seguro de que es lo que le produce la reaccin, [place]ar[place] manager un registro de los alimentos que come y los sntomas que le siguen. Evite los Nurse, mental health.  Si presenta urticaria o una erupcin cutnea:  Tome los medicamentos como se le indic.  Utilice un antihistamnico de Radio broadcast assistant (difenhidramina) para las ronchas y Cabin crew, segn sea necesario.  Aplquese compresas fras sobre la piel o tome baos de agua fra. Evite los [place] calientes. Estos aumentarn el enrojecimiento y Cabin crew.  Si usted es muy alrgico:  Generalmente es necesaria la hospitalizacin luego de una reaccin grave.  Utilice un brazalete o collar de alerta mdico, indicando el tipo de [place].  Lleve siempre con usted el kit para anafilaxis o la inyeccin de epinefrina Tanto usted como los [employer] de su familia deberan saber como utilizarlo. Esto podr salvarle la vida si tiene una reaccin grave. Si ha usado la epinefrina, es importante que solicite atencin mdica de inmediato o se comunique con el servicio de urgencias de su localidad (911 en los [place]). Cuando el efecto de la epinefrina desaparece, puede seguir una reaccin tarda que puede resultar mortal.  Reponga la epinefrina inmediatamente despus de usarla en caso de otra reaccin.  Pdale informacin al mdico si no le han enseado a  usar la inyeccin de epinefrina.  No conduzca hasta que desaparezca el efecto de los medicamentos para tratar la reaccin, a menos que el profesional que lo asiste lo autorice. SOLICITE ATENCIN MDICA SI:  Sospecha que puede sufrir una alergia a algn alimento. Los sntomas generalmente ocurren dentro de los 30 minutos posteriores a haber ingerido el alimento.  Los sntomas persistieron durante 2 das. Concurra al profesional que lo asiste rpidamente si los sntomas empeoran.  Desarrolla nuevos sntomas.  Quiere volver a probar un alimento o bebida que usted cree que le causa una reaccin IT consultant. Nunca lo haga si ha sufrido una reaccin anafilctica a ese alimento o a esa bebida con anterioridad.  Reaparecen los sntomas que lo llevaron a la consulta con el profesional. SOLICITE ATENCIN MDICA DE INMEDIATO SI:  Presenta dificultad para respirar, Tobin Chad o tiene una sensacin de opresin en el pecho o en la garganta.  Tiene la boca hinchada, o presenta urticaria, hinchazn o picazn en todo el cuerpo. Use inmediatamente la inyeccin de epinefrina. Se aplica en la parte externa del muslo, bien profundo dentro del msculo. Luego de usar la inyeccin de epinefrina, solicite ayuda inmediatamente. Solicite atencin mdica de inmediato o comunquese con el servicio de urgencias de su localidad (911 en los [employer]  [place]). EST SEGURO QUE:   Comprende las instrucciones para el alta mdica.  Controlar su enfermedad.  Solicitar atencin mdica de inmediato segn las indicaciones. Document Released: 10/15/2005 Document Revised: 01/07/2012 [facility] Patient Information 2015 [place]. This information is not intended to replace advice given to you by your health care provider. Make sure you discuss any questions you have with your health care provider.

## 2015-05-09 NOTE — ED Notes (Signed)
Pt A&OX4, ambulatory at d/c with steady gait, NAD 

## 2015-05-19 ENCOUNTER — Telehealth: Payer: Self-pay | Admitting: *Deleted

## 2015-05-19 ENCOUNTER — Ambulatory Visit (INDEPENDENT_AMBULATORY_CARE_PROVIDER_SITE_OTHER): Payer: BLUE CROSS/BLUE SHIELD | Admitting: Family Medicine

## 2015-05-19 VITALS — BP 160/100 | HR 84 | Temp 98.2°F | Resp 16 | Ht 62.0 in | Wt 275.6 lb

## 2015-05-19 DIAGNOSIS — N926 Irregular menstruation, unspecified: Secondary | ICD-10-CM

## 2015-05-19 DIAGNOSIS — L438 Other lichen planus: Secondary | ICD-10-CM

## 2015-05-19 DIAGNOSIS — N911 Secondary amenorrhea: Secondary | ICD-10-CM | POA: Diagnosis not present

## 2015-05-19 DIAGNOSIS — E1165 Type 2 diabetes mellitus with hyperglycemia: Secondary | ICD-10-CM | POA: Diagnosis not present

## 2015-05-19 DIAGNOSIS — L439 Lichen planus, unspecified: Secondary | ICD-10-CM | POA: Diagnosis not present

## 2015-05-19 DIAGNOSIS — L298 Other pruritus: Secondary | ICD-10-CM | POA: Diagnosis not present

## 2015-05-19 DIAGNOSIS — S30814A Abrasion of vagina and vulva, initial encounter: Secondary | ICD-10-CM

## 2015-05-19 DIAGNOSIS — N898 Other specified noninflammatory disorders of vagina: Secondary | ICD-10-CM

## 2015-05-19 DIAGNOSIS — IMO0002 Reserved for concepts with insufficient information to code with codable children: Secondary | ICD-10-CM

## 2015-05-19 LAB — POCT URINALYSIS DIPSTICK
BILIRUBIN UA: NEGATIVE
Glucose, UA: 500
Ketones, UA: NEGATIVE
Nitrite, UA: NEGATIVE
Protein, UA: NEGATIVE
Spec Grav, UA: 1.02
UROBILINOGEN UA: 0.2
pH, UA: 5

## 2015-05-19 LAB — POCT WET PREP WITH KOH
KOH Prep POC: NEGATIVE
RBC Wet Prep HPF POC: NEGATIVE
Trichomonas, UA: NEGATIVE
Yeast Wet Prep HPF POC: NEGATIVE

## 2015-05-19 LAB — POCT URINE PREGNANCY: PREG TEST UR: NEGATIVE

## 2015-05-19 LAB — POCT UA - MICROSCOPIC ONLY
CRYSTALS, UR, HPF, POC: NEGATIVE
Casts, Ur, LPF, POC: NEGATIVE
YEAST UA: NEGATIVE

## 2015-05-19 MED ORDER — CLOBETASOL PROPIONATE 0.05 % EX CREA: 1.0000 "application " | TOPICAL_CREAM | Freq: Two times a day (BID) | CUTANEOUS | Status: DC

## 2015-05-19 MED ORDER — NYSTATIN 100000 UNIT/GM EX CREA: 1.0000 "application " | TOPICAL_CREAM | Freq: Two times a day (BID) | CUTANEOUS | Status: DC

## 2015-05-19 NOTE — Telephone Encounter (Signed)
I spoke with her. Our office should have scheduled for her to see me this Monday

## 2015-05-19 NOTE — Telephone Encounter (Signed)
Dr Brigitte Pulse called from [facility] [place] asked if patient should be referred to our office for lichen planus of vulva, having lots of itching and throbbing. Notes are in epic. Dr.Shaw direct # is [phone] if you need to speak with her. Please advise

## 2015-05-19 NOTE — Patient Instructions (Signed)
Lichen Planus Lichen planus is a skin problem. It causes redness, itching, puffiness (swelling), and sores. Areas of the body that are often affected are the:  Vulva and vagina.  Gums and inside of the mouth.  Tube between your mouth and stomach (esophagus).  Scalp.  Skin of the arms (especially wrists), legs, chest, back, and belly (abdomen).  Fingernails or toenails. The cause is not known. Lichen planus is not passed from one person to another. It can last for a long time. HOME CARE  Only take medicines as told by your doctor.  Keep the vagina as clean and dry as you can. GET HELP RIGHT AWAY IF: Your pain, puffiness, or redness gets worse. MAKE SURE YOU:  Understand these instructions.  Will watch your condition.  Will get help right away if you are not doing well or get worse. Document Released: 09/27/2008 Document Revised: 01/07/2012 Document Reviewed: 02/16/2011 [facility] Patient Information 2015 [place], [place]. This information is not intended to replace advice given to you by your health care provider. Make sure you discuss any questions you have with your health care provider. Liquen plano  (Lichen Planus)  El liquen plano es una enfermedad de la piel que causa irritacin, picazn, hinchazn y llagas. La zonas ms afectadas son:  La vulva y la vagina.  Las encas y la zona interior de la boca.  El esfago.  El cuero cabelludo.  La piel de los brazos (especialmente las [place]), las piernas, el pecho, la espalda y el abdomen.  [place] y los pies. CAUSAS  La causa no se conoce. Puede ser Ardelia Mems enfermedad autoinmune o tratarse de Obie Dredge. La enfermedad autoinmune aparece cuando el organismo se ataca a s mismo. El lique plano no se transmite de [place] persona a otra (no se contagia). Puede durar [employer]. En los nios afectados generalmente hay una historia familiar de liquen plano.  SNTOMAS   Picazn, que puede ser [employer].  La zona  vaginal puede estar irritada, doler, estar spera o sentir ardor.  Puede haber [place].  La cicatrizacin puede hacer que la vagina se acorte, se estreche y Nurse, children's se cierre.  La piel puede tener pequeos bultos de color rojo o prpura, planos, redondos o de forma irregular.  Puede haber irritacin o manchas blancas en las encas o en la lengua.  Las uas pueden estar ms finas, speras o tener ondulaciones.  En los ojos rara vez hay dolor, irritacin o cicatrizacin. DIAGNSTICO  El mdico observar cambios en la piel, en la boca o las secreciones vaginales. En algunos casos se enviar una muestra de tejido (biopsia) para Physiological scientist.  TRATAMIENTO   El mdico le indicar una crema (corticoides tpicos) para Psychologist, counselling.  Le indicarn medicamentos para tomar por boca.  Uno de los tratamientos [employer] exposicin a los rayos [place].  Las llagas en la boca pueden tratarse con pastillas para la garganta que debe chupar como las pastillas para la tos.  Si la vagina est muy apretada, le ensearn a usar un dilatador para Art therapist. INSTRUCCIONES PARA EL CUIDADO EN EL HOGAR   Tome slo medicamentos de venta libre o recetados, segn las indicaciones del mdico.  Mantenga la zona vaginal tan limpia y seca como sea posible. SOLICITE ATENCIN MDICA SI:  El dolor, la hinchazn o el enrojecimiento [place].  Document Released: 06/27/2011 Document Revised: 01/07/2012 [facility] Patient Information 2015 [place]. This information is not intended  to replace advice given to you by your health care provider. Make sure you discuss any questions you have with your health care provider.  

## 2015-05-19 NOTE — Progress Notes (Signed)
Subjective:    Patient ID: [name], female    DOB: [birth year], 39 y.o.   MRN: [id number] Chief Complaint  Patient presents with  . Groin Pain    x 4 days, throbbing    HPI  She is having a lot of itching which is similar to what she had 5 yrs ago.  She is having a lot of throbbing, no vaginal discharge.  Has not had a period for 4-5 months  She recently had a hives and shortness of breath after eating shrimp 10d ago.  She recently had a course of penicillin which she was allergic to.  4-5 yrs ago she had 1 year of vaginal pain - she reports she was treated with something that made her sick for a full yr. She was treated with 6 different creams and finally she received a cream from her country that was mailed to her which was finally effective. She has absolutely no idea what things she tried or what worked. She was in the ER twice due to severity of rash.  She c/o recurrent pelvic infections and has been traveling a lot - she did have a folliculitis vs genital abscess and had I&D which was left open but then 1 wks after that she developed a red rash with pimples and itching.  She went to the ER about this who thought that maybe she could have   She has not had a STD prior and is not sexually active at all. She is married but husband lives in a different place.  Past Medical History  Diagnosis Date  . H. pylori infection     dx  09-02-2014-- treated w/ antibiotic  . GERD (gastroesophageal reflux disease)   . Type 2 diabetes mellitus   . Carpal tunnel syndrome, bilateral   . PCOS (polycystic ovarian syndrome)   . Menorrhagia   . Irregular menstrual cycle   . Hypertension   . Depression    Past Surgical History  Procedure Laterality Date  . Cesarean section  1995  . Carpal tunnel release Bilateral 10/07/2014    Procedure: CARPAL TUNNEL RELEASE BILATERAL;  Surgeon: [name], MD;  Location: [facility];  Service: Orthopedics;  Laterality: Bilateral;     Current Outpatient Prescriptions on File Prior to Visit  Medication Sig Dispense Refill  . EPINEPHrine (EPIPEN 2-PAK) 0.3 mg/0.3 mL IJ SOAJ injection Inject 0.3 mLs (0.3 mg total) into the muscle once. 1 Device 2  . FLUoxetine (PROZAC) 20 MG tablet Take 2 tablets (40 mg total) by mouth daily. 60 tablet 5  . fluticasone (FLONASE) 50 MCG/ACT nasal spray Place 2 sprays into both nostrils daily. 16 g 12  . glimepiride (AMARYL) 4 MG tablet Take 2 tabs (8 mg) po twice daily. 120 tablet 3  . hydrochlorothiazide (HYDRODIURIL) 25 MG tablet Take 1 tablet (25 mg total) by mouth daily. 90 tablet 3  . metFORMIN (GLUCOPHAGE) 500 MG tablet Take 2 tabs by mouth (1000 mg ) 2 times daily. 120 tablet 3  . metoprolol succinate (TOPROL-XL) 25 MG 24 hr tablet Take 1 tablet (25 mg total) by mouth daily. 90 tablet 0  . omeprazole (PRILOSEC) 20 MG capsule Take 1 capsule (20 mg total) by mouth daily. 90 capsule 3  . predniSONE (DELTASONE) 20 MG tablet 3 tabs po daily x 3 days, then 2 tabs x 3 days, then 1.5 tabs x 3 days, then 1 tab x 3 days, then 0.5 tabs x 3 days 27  tablet 0  . saxagliptin HCl (ONGLYZA) 5 MG TABS tablet Take 1 tablet (5 mg total) by mouth daily. 30 tablet 11   No current facility-administered medications on file prior to visit.   Allergies  Allergen Reactions  . Peach [Prunus Persica] Anaphylaxis   Family History  Problem Relation Age of Onset  . Heart disease Mother   . Diabetes Mother   . Hypertension Mother   . Cancer Father    History   Social History  . Marital Status: Married    Spouse Name: N/A  . Number of Children: 2  . Years of Education: N/A   Social History Main Topics  . Smoking status: Never Smoker   . Smokeless tobacco: Never Used  . Alcohol Use: 0.0 oz/week    0 Standard drinks or equivalent per week     Comment: socially  . Drug Use: No  . Sexual Activity:    Partners: Male    Birth Control/ Protection: None   Other Topics Concern  . None   Social History  Narrative   No reported caffeine use      Review of Systems  Constitutional: Negative for fever, chills, diaphoresis, activity change, appetite change, fatigue and unexpected weight change.  Respiratory: Negative for chest tightness, shortness of breath and wheezing.   Gastrointestinal: Negative for abdominal pain, diarrhea, constipation, blood in stool, anal bleeding and rectal pain.  Endocrine: Positive for polydipsia and polyphagia.  Genitourinary: Positive for genital sores, vaginal pain, menstrual problem and pelvic pain. Negative for dysuria, urgency, frequency, hematuria, flank pain, decreased urine volume, vaginal bleeding, vaginal discharge, enuresis and difficulty urinating.  Musculoskeletal: Negative for gait problem.  Skin: Positive for color change, rash and wound. Negative for pallor.  Hematological: Negative for adenopathy.  Psychiatric/Behavioral: The patient is not nervous/anxious.        Objective:  BP 160/100 mmHg  Pulse 84  Temp(Src) 98.2 F (36.8 C) (Oral)  Resp 16  Ht 5\' 2"  (1.575 m)  Wt 275 lb 9.6 oz (125.011 kg)  BMI 50.39 kg/m2  SpO2 99%  Physical Exam  Constitutional: She is oriented to person, place, and time. She appears well-developed and well-nourished. No distress.  HENT:  Head: Normocephalic and atraumatic.  Cardiovascular: Normal rate, regular rhythm, normal heart sounds and intact distal pulses.   Pulmonary/Chest: Effort normal and breath sounds normal.  Abdominal: Soft. Bowel sounds are normal. She exhibits no distension. There is no tenderness. There is no rebound and no guarding.  Genitourinary: Uterus normal. Pelvic exam was performed with patient supine. No labial fusion. There is rash, lesion and injury on the right labia. There is no tenderness on the right labia. There is rash, lesion and injury on the left labia. There is no tenderness on the left labia. Cervix exhibits no motion tenderness and no friability. Right adnexum displays no  mass, no tenderness and no fullness. Left adnexum displays no mass, no tenderness and no fullness. There is erythema in the vagina. No tenderness or bleeding in the vagina. No foreign body around the vagina. No signs of injury around the vagina. Vaginal discharge found.  Inguinal creases bilateral with shiny scar like skin. Labia minora muscosa appears thinned with numerous tiny abrasions/erosions around vaginal introitus that are not significantly tender - HSV clx obtained of these. Labia majora appears to be scarred down to be constricted/tightened appearance.  Lymphadenopathy:       Right: No inguinal adenopathy present.       Left: No inguinal  adenopathy present.  Neurological: She is alert and oriented to person, place, and time.  Skin: Skin is warm and dry. She is not diaphoretic.  Psychiatric: She has a normal mood and affect. Her behavior is normal.          Assessment & Plan:   1. Vaginal pruritus   2. Erosive lichen planus of vulva   3. Atrophic lichen planus   4. Abrasion of vagina, initial encounter   5. Secondary amenorrhea   6. Type II diabetes mellitus, uncontrolled   7. Severe obesity (BMI >= 40)   8. Irregular menstrual cycle   High concern for lichen planus - discussed case with Dr. Uvaldo Rising at [facility] who has kindly offered to work pt in next wk - ok to go ahead and start topical bid clobetasol - starting treatment will not put a sig barrier to eval/diagnosis if sxs start to improve as will likely going to need to biopsy to clench diagnosis.   Orders Placed This Encounter  Procedures  . Herpes simplex virus culture  . Ambulatory referral to Gynecology    Referral Priority:  Routine    Referral Type:  Consultation    Referral Reason:  Specialty Services Required    Referred to Provider:  Terrance Mass, MD    Requested Specialty:  Gynecology    Number of Visits Requested:  1  . POCT UA - Microscopic Only  . POCT urinalysis dipstick  . POCT Wet  Prep with KOH  . POCT urine pregnancy    Meds ordered this encounter  Medications  . DISCONTD: nystatin cream (MYCOSTATIN)    Sig: Apply 1 application topically 2 (two) times daily.    Dispense:  30 g    Refill:  3  . clobetasol cream (TEMOVATE) 0.05 %    Sig: Apply 1 application topically 2 (two) times daily. x10d    Dispense:  60 g    Refill:  0    Delman Cheadle, MD MPH

## 2015-05-23 ENCOUNTER — Other Ambulatory Visit: Payer: Self-pay | Admitting: Gynecology

## 2015-05-23 ENCOUNTER — Encounter: Payer: Self-pay | Admitting: Gynecology

## 2015-05-23 ENCOUNTER — Ambulatory Visit (INDEPENDENT_AMBULATORY_CARE_PROVIDER_SITE_OTHER): Payer: BLUE CROSS/BLUE SHIELD | Admitting: Gynecology

## 2015-05-23 VITALS — BP 136/90 | Ht 62.0 in | Wt 275.0 lb

## 2015-05-23 DIAGNOSIS — Z8619 Personal history of other infectious and parasitic diseases: Secondary | ICD-10-CM

## 2015-05-23 DIAGNOSIS — N912 Amenorrhea, unspecified: Secondary | ICD-10-CM

## 2015-05-23 DIAGNOSIS — N760 Acute vaginitis: Secondary | ICD-10-CM | POA: Diagnosis not present

## 2015-05-23 DIAGNOSIS — I10 Essential (primary) hypertension: Secondary | ICD-10-CM | POA: Diagnosis not present

## 2015-05-23 DIAGNOSIS — L680 Hirsutism: Secondary | ICD-10-CM

## 2015-05-23 DIAGNOSIS — Z113 Encounter for screening for infections with a predominantly sexual mode of transmission: Secondary | ICD-10-CM

## 2015-05-23 LAB — WET PREP FOR TRICH, YEAST, CLUE
CLUE CELLS WET PREP: NONE SEEN
TRICH WET PREP: NONE SEEN

## 2015-05-23 LAB — PREGNANCY, URINE: PREG TEST UR: NEGATIVE

## 2015-05-23 LAB — HERPES SIMPLEX VIRUS CULTURE: Organism ID, Bacteria: NOT DETECTED

## 2015-05-23 MED ORDER — MEDROXYPROGESTERONE ACETATE 10 MG PO TABS: ORAL_TABLET | ORAL | Status: DC

## 2015-05-23 MED ORDER — FLUCONAZOLE 150 MG PO TABS
ORAL_TABLET | ORAL | Status: DC
Start: 2015-05-23 — End: 2015-06-29

## 2015-05-23 NOTE — Patient Instructions (Signed)
Fluconazole tablets Qu es este medicamento? El FLUCONAZOL es un medicamento antimictico. Se utiliza para tratar ciertos tipos de infecciones micticas o por levadura. Este medicamento puede ser utilizado para otros usos; si tiene alguna pregunta consulte con su proveedor de atencin mdica o con su farmacutico. MARCAS COMERCIALES DISPONIBLES: Diflucan Qu le debo informar a mi profesional de la salud antes de tomar este medicamento? Necesita saber si usted presenta alguno de los siguientes problemas o situaciones: -antecedentes de pulso cardaco irregular -enfermedad renal -una reaccin alrgica o inusual al fluconazol, a otros azoles u otros medicamentos, alimentos, colorantes o conservantes -si est embarazada o buscando quedar embarazada -si est amamantando a un beb Cmo debo utilizar este medicamento? Tome este medicamento por va oral. Siga las instrucciones de la etiqueta del [place]. No tome su medicamento con una frecuencia mayor a la indicada. Hable con su pediatra para informarse acerca del uso de este medicamento en nios. Puede requerir atencin especial. Este medicamento ha sido recetado a nios tan menores como de 6 meses de [place]. Sobredosis: Pngase en contacto inmediatamente con un centro toxicolgico o una sala de urgencia si usted cree que haya tomado demasiado medicamento. ATENCIN: [employer] es solo para usted. No comparta este medicamento con nadie. Qu sucede si me olvido de una dosis? Si olvida una dosis, tmela lo antes posible. Si es casi la hora de la prxima dosis, tome slo esa dosis. No tome dosis adicionales o dobles. Qu puede interactuar con este medicamento? No tome esta medicina con ninguno de los siguientes medicamentos: -astemizol -ciertos medicamentos para el pulso cardiaco irregular, tales como dofetilida, dronedarona, quinidina -cisapride -eritromicina -lomitapida -otros medicamentos que prolongan el intervalo QT (causa un ritmo  cardiaco anormal) -pimozida -terfenadina -tioridazina -tolvaptn -ziprasidona Esta medicina tambin puede interactuar con los siguientes medicamentos: -medicamentos antivirales para el VIH o SIDA -pldoras anticonceptivas -ciertos antibiticos, tales como rifabutina, rifampicina -ciertos medicamentos para la presin sangunea, tales como amlodipina, isradipina, felodipino, hidroclorotiazida, losartn, nifedipina -ciertos medicamentos para el cncer, tales como ciclofosfamida, vinblastina, vincristina -ciertos medicamentos para el colesterol, tales como atorvastatina, lovastatina, fluvastatina, simvastatina -ciertos medicamentos para la depresin, ansiedad o trastornos psicticos, tales como amitriptilina, midazolam, nortriptilina, triazolam -ciertos medicamentos para la diabetes, tales como glipizida, gliburida, tolbutamida -ciertos medicamentos para Conservation officer, historic buildings, tales como alfentanilo, fentanilo, metadona -ciertos medicamentos para convulsiones, tales como carbamazepina, fenitona -ciertos medicamentos que tratan o previenen cogulos sanguneos, tales como warfarina -halofantrina -medicamentos que reducen su capacidad de combatir infecciones, tales como ciclosporina, prednisona, tacrolimo -los AINE, medicamentos para el dolor o inflamacin, tales como celecoxib, diclofenaco, flurbiprofeno, ibuprofeno, meloxicam, naproxeno -otros medicamentos para infecciones micticas -sirolims -teofilina -tofacitinib Puede ser que esta lista no menciona todas las posibles interacciones. Informe a su profesional de KB Home	[place] de [employer] Corporation productos a base de hierbas, medicamentos de [place] o suplementos nutritivos que est tomando. Si usted fuma, consume bebidas alcohlicas o si utiliza drogas ilegales, indqueselo tambin a su profesional de KB Home	[place]. Algunas sustancias pueden interactuar con su medicamento. A qu debo estar atento al usar [employer]? Visite a su mdico o a su profesional de la  salud para chequear su evolucin peridicamente. Si toma este medicamento durante un perodo de [employer] Electric, podr Customer service manager anlisis de [place]. Si los sntomas no mejoran, consulte a su mdico. Pueden transcurrir [employer] o meses de tratamiento antes de que algunas infecciones micticas se curen. El alcohol puede aumentar la posibilidad de daos al hgado. Evite consumir bebidas alcohlicas. Si tiene una infeccin vaginal, no tenga relaciones  sexuales hasta que haya completado el tratamiento. Puede usar una toallita higinica. No use tampones. Use ropa interior de algodn no sintticos, y recin lavadas. Qu efectos secundarios puedo tener al [employer] Corporation este medicamento? Efectos secundarios que debe informar a su mdico o a Barrister's clerk de la salud tan pronto como sea posible: -Chief of Staff como erupcin cutnea, picazn o urticarias, hinchazn de los labios, boca, lengua o garganta -orina de color amarillo oscuro -sensacin de mareos o desmayos -pulso cardiaco irregular o dolor en el pecho -enrojecimiento, formacin de ampollas, descamacin o distensin de la piel, inclusive dentro de la boca -dificultad al respirar -sangrado o magulladuras inusuales -vmito -color amarillento de los ojos o la piel Efectos secundarios que, por lo general, no requieren Geophysical data processor (debe informarlos a su mdico o a Barrister's clerk de la salud si persisten o si son molestos): -cambios en el sabor de los alimentos -diarrea -dolor de cabeza -Higher education careers adviser o nuseas Puede ser que esta lista no menciona todos los posibles efectos secundarios. Comunquese a su mdico por asesoramiento mdico Humana Inc. Usted puede informar los efectos secundarios a la FDA por telfono al 1-800-FDA-1088. Dnde debo guardar mi medicina? Mantngala fuera del alcance de los nios. [place] a [employer], a menos de 30 grados C (60 grados F). Deseche todo el medicamento  que no haya utilizado, despus de la fecha de vencimiento. ATENCIN: Este folleto es un resumen. Puede ser que no cubra toda la posible informacin. Si usted tiene preguntas acerca de esta medicina, consulte con su mdico, su farmacutico o su profesional de Technical sales engineer.  2015, Elsevier/Gold Standard. (2013-07-09 14:13:08) Vaginitis  (Vaginitis)  La vaginitis es la inflamacin de la vagina. Generalmente se debe a un cambio en el equilibrio normal de las bacterias y hongos que viven en la vagina. Este cambio en el equilibrio causa un crecimiento excesivo de ciertas bacterias y hongos, lo que causa la inflamacin. Hay diferentes tipos de vaginitis, pero los ms comunes son:   Vaginitis bacteriana.  Infeccin por hongos (candidiasis).  Vaginitis por tricomoniasis. Esta es una enfermedad de transmisin sexual (ETS).  Vaginitis viral.  Vaginitis atrfica.  Vaginitis alrgica. CAUSAS  El tratamiento depende del tipo de vaginitis. Las causas pueden ser:   Bacterias (vaginitis bacteriana).  Hongos (infeccin por hongos).  Parsitos (vaginitis por tricomoniasis).  Virus (vaginitis viral).  Niveles hormonales bajos (vaginitis atrfica). Los niveles bajos de hormonas pueden ocurrir durante el Media planner, la Transport planner o despus de la menopausia.  Irritantes, como los baos de [place], los tampones perfumados y los aerosoles femeninos (vaginitis IT consultant). Otros factores pueden alterar el equilibrio normal de los hongos y las bacterias que viven en la vagina. Ellos son:   Antibiticos.  Higiene personal deficiente.  Diafragmas, esponjas vaginales, espermicidas, pldoras anticonceptivas y dispositivos intrauterinos (DIU).  [place].  Infecciones.  La diabetes no controlada.  Tener un sistema inmunolgico debilitado. SNTOMAS  Los sntomas pueden variar segn la causa de la vaginitis. Los sntomas ms comunes son:   Flujo vaginal anormal.  La secrecin es de color  blanco, gris o amarillento en la vaginitis bacteriana.  La secrecin es espesa, blanca y con apariencia de queso en la infeccin por hongos.  La secrecin es espumosa y de color amarillo o verdoso en la tricomoniasis.  Mal olor vaginal.  En la vaginitis bacteriana puede haber olor a "pescado".  Picazn, dolor o hinchazn vaginal.  Relaciones sexuales dolorosas.  Dolor o ardor al Garment/textile technologist. En ocasiones puede no haber sntomas.  TRATAMIENTO  El tratamiento depende de la gravedad de la lesin.   La vaginitis bacteriana y la tricomoniasis a menudo se tratan con cremas o comprimidos antibiticos.  Las infecciones por hongos se tratan con medicamentos antifngicos, como cremas o supositorios vaginales.  La vaginitis viral no tiene [place], [employer] los sntomas pueden tratarse con medicamentos que [employer]. Su pareja sexual tambin debe ser tratarse.  La vaginitis atrfica puede tratarse con crema, comprimidos, supositorios, o anillo vaginal con estrgenos. Si tiene sequedad vaginal, los lubricantes y las cremas hidratantes pueden ayudarla. Posiblemente le pedirn que evite los [place], aerosoles o duchas perfumados.  El tratamiento de la vaginitis alrgica implica renunciar al uso del producto que est causando el problema. Las cremas vaginales pueden usarse para tratar los sntomas. [place] todos los medicamentos segn le indic su mdico.  Mantenga la zona vaginal limpia y seca. Evite el jabn y slo enjuague el rea con agua.  Evite la ducha vaginal. Puede eliminar las bacterias saludables que hay en la vagina.  No utilice tampones ni tenga relaciones sexuales hasta que el profesional la autorice. No use apsitos mientras tenga vaginitis.  Higiencese de adelante hacia atrs. Esto evita la propagacin de bacterias desde el recto hacia la vagina.  Deje que el aire llegue a su rea genital.  Use ropa interior de algodn para reducir  la acumulacin de humedad.  Evite el uso de ropa interior cuando duerme hasta que la vaginitis haya mejorado.  Evite la ropa interior o medias de nylon ajustadas y que no tengan un panel de algodn.  Qutese la ropa hmeda (especialmente el traje de bao) tan pronto como sea posible.  Utilice productos suaves sin perfume. Evite el uso de sustancias irritantes como:  Aerosoles femeninos perfumados.  Suavizantes de tela.  Detergentes perfumados.  Tampones perfumados.  Jabones o baos de espuma perfumados.  Practique el sexo seguro y use condones. Los condones pueden prevenir la transmisin de la tricomoniasis y la vaginitis viral. Dana Allan ATENCIN MDICA SI:   Siente dolor abdominal.  Tiene fiebre o sntomas persistentes durante ms de 2  3 das.  Tiene fiebre y los sntomas empeoran repentinamente. Document Released: 01/31/2009 Document Revised: 07/09/2012 [facility] Patient Information 2015 [place]. This information is not intended to replace advice given to you by your health care provider. Make sure you discuss any questions you have with your health care provider. Infertilidad (Infertility) QU ES LA INFERTILIDAD?  La infertilidad normalmente se define como la incapacidad para quedar embarazada luego de un ao de relaciones sexuales regulares sin la utilizacin de mtodos anticonceptivos. O la incapacidad de llevar a trmino Nutritional therapist y Best boy el beb. La tasa de infertilidad en los [place] es de alrededor del 10%. El embarazo es el resultado de una cadena de sucesos. La mujer debe liberar el vulo de uno de sus ovarios (ovulacin). El vulo debe fertilizarse con el esperma. Luego viaja a travs de las trompas de [place] hacia el tero (matriz), donde se une a la pared del tero y crece. El hombre debe tener suficiente esperma y el esperma debe unirse con el vulo (fertilizar) en el momento justo. El vulo fertilizado debe luego unirse al interior del tero. Esto  parece simple, pero pueden ocurrir [employer] Company cosas que evitan que el embarazo se produzca.  DE QUIN ES EL PROBLEMA?  Un 20% de los casos de infertilidad se deben a problemas del hombres (factores masculinos) y un 65% se  debe a problemas de la mujer (factores femeninos). Otras causas pueden ser Ardelia Mems combinacin de factores masculinos y femeninos o a causas desconocidas.  CULES SON LAS CAUSAS DE [place]?  La infertilidad en el hombre a menudo se debe a problemas para producir el esperma o hacer que el mismo llegue al vulo. Los problemas de esperma pueden existir desde el nacimiento o desarrollarse ms tarde debido a enfermedades o lesiones. Algunos hombres no producen esperma, o producen muy poco (oligospermia). Entre otros problemas se incluyen:  Disfuncin sexual  Problemas hormonales o endocrinos.  La edad. La fertilidad del hombre disminuye con la edad, pero no tan temprano como la femenina.  Infecciones.  Problemas congnitos Defectos de nacimiento, como ausencia de los tubos que transportan el esperma (conductos deferentes).  Problemas genticos o de cromosomas.  Problemas de anticuerpos antiesperma.  Eyaculacin retrgrada (el esperma va hacia la vejiga).  Varicoceles, espermatoceles o tumores en los testculos.  El estilo de vida puede influir en el nmero y la calidad del esperma del hombre.  El alcohol y las drogas pueden reducir temporalmente la calidad del esperma.  Las toxinas ambientales, como los pesticidas y el plomo, pueden ocasionar algunos casos de infertilidad en hombres. CULES SON LAS CAUSAS DE LA INFERTILIDAD EN LA MUJER?   La infertilidad en las mujeres est causada mayormente por problemas con la ovulacin. Sin ovulacin, el vulo no puede fertilizarse.  Seales de problemas de ovulacin son perodos menstruales irregulares o ningn perodo.  Factores simples del estilo de vida, como el estrs, la dieta, o el entrenamiento deportivo,  pueden afectar el balance hormonal de Arts administrator.  La edad. La fertilidad comienza a Chiropractor alrededor de los 30 aos y [place] a Proofreader de los 8.  Mucho menos a menudo, un desequilibrio hormonal por un problema mdico serio como un tumor en la glndula pituitaria, tiroides u otra enfermedad mdica crnica pueden ocasionar problemas de ovulacin.  Infecciones plvicas.  Sndrome de ovarios poliqusticos (aumento de hormonas masculinas, incapacidad de ovular).  Consumo de alcohol o drogas.  Toxinas ambientales, radiacin, pesticidas y ciertos qumicos.  La edad es un factor importante en la infertilidad femenina.  La capacidad de los ovarios de una mujer para producir vulos disminuye con la edad, especialmente despus de los 53 aos. Alrededor de un tercio de las parejas en las que la mujer tiene ms de 35 aos tendr problemas de infertilidad.  En el momento en el que alcanza la [place], cuando su perodo menstrual se detiene, la mujer ya no puede producir vulos ni quedar embarazada.  Otros problemas tambin pueden llevar a la infertilidad en las mujeres. Si las trompas de [employer] estn bloqueadas en [employer] extremos, el vulo no puede pasar a travs de los tubos [place]. Tejido cicatrizal (adhesiones) en la pelvis que pueden obstruir las trompas. Esto puede dar como resultado una enfermedad inflamatoria plvica, endometriosis o una ciruga por embarazo ectpico (en la que el vulo fertilizado se ha implantado fuera del tero) o cualquier ciruga plvica o abdominal que ocasione adherencias.  Tumores fibroides o plipos en el tero.  Anormalidades congnitas (de nacimiento) del tero.  Infecciones en el cuello del tero (cervicitis).  Estenosis cervical (estrechamiento).  Mucosidad cervical anormal.  Sndrome poliqustico de los ovarios.  Tener relaciones sexuales muy seguidas ([place] 4 a 5 veces por  semana).  Obesidad.  Anorexia.  Dficit nutricional.  Mucho ejercicio, con prdida de grasa corporal.  DES. Su madre ha recibido la hormona dietilstilbesterol cuando estaba embarazada de usted. CMO SE ANALIZA LA INFERTILIDAD?  Si ha estado tratando de quedar embarazada sin xito, podra querer buscar ayuda mdica. No debera esperar un ao de intentos sin xito antes de buscar ayuda profesional si:  Tiene mas de 62 aos de edad  Tiene razones para creer que podra tener problemas de fertilidad. Un examen mdico determinar las causas de infertilidad de la pareja, Normalmente el proceso comienza con:  Exmenes fsicos  Historias clnicas de ambos miembros de la pareja.  Historiales sexuales de ambos miembros de la pareja. Si no hay un problema evidente, como relaciones sexuales en tiempos inadecuados o ausencia de ovulacin, se necesitarn Training and development officer.   Para el hombre, los anlisis normalmente comienzan con pruebas de semen para observar:  La cantidad de esperma.  La forma del esperma.  El movimiento del esperma.  Realizar un historial clnico y quirrgico completo.  Examen fsico.  Control de infecciones en los rganos reproductivos. Podrn realizarle anlisis hormonales.   Para la mujer, el primer paso del anlisis es saber si ovula cada mes. Hay diferentes modos de Secondary school teacher. Por ejemplo, puede llevar un registro de los cambios en la temperatura corporal por la maana y la textura de la mucosidad cervical. Otra herramienta es un kit de prueba de ovulacin casero, que puede comprarse en la farmacia.  Tambin pueden realizarse controles de ovulacin en el consultorio mdico, mediante anlisis de sangre para observar los niveles de hormonas o pruebas de [employer] ovarios. Si la mujer est ovulando, se necesitarn realizar ms exmenes. Algunas femeninas comunes incluyen:  Histerosalpingografa: Se realizan rayos x de las trompas de [place] y el tero  con una inyeccin de [place]. Mostrar si las trompas estn abiertas y la forma del tero.  Laparoscopa: Un anlisis de las trompas y otros rganos femeninos para Risk manager. Se utiliza un tubo con iluminacin llamado laparoscopio para observar el interior del abdomen.  Biopsia endometrial: Se toma una muestra del tejido del tero Engineer, manufacturing systems del perodo menstrual, para ver si el tejido le indica si est ovulando.  Prueba de ultrasonido transvaginal: Examina los rganos femeninos.  Histeroscopa: Utiliza un tubo con iluminacin para examinar el cuello del tero y el tero y ver si hay anormalidades dentro del mismo. TRATAMIENTO Dependiendo de los [employer], se sugerirn Engineer, technical sales. El tratamiento depende de la causa. Entre el 70 y el 90% de los casos de infertilidad se tratan con drogas o [place].   Hay varias drogas para la fertilidad que pueden utilizarse para las mujeres con problemas de ovulacin. Es importante hablar con el profesional que la asiste sobre la droga a Risk manager. Deber comprender los beneficios y [place] drogas. Segn el tipo de droga para la fertilidad y la dosis [place], algunas mujeres podran tener embarazos mltiples (mellizos).  De ser [employer], se puede realizar una ciruga para reparar daos en los ovarios de la [address], trompas de [place], el cuello del tero o el tero.  Tratamiento quirrgico o mdico para la endometriosis o el sndrome de ovario poliqustico. A veces, los problemas de fertilidad en el hombre pueden corregirse con medicamentos o [place].  Inseminacin intrauterina, IUI, de esperma en concordancia con la ovulacin.  Cambios en el estilo de vida, si esta es la causa (prdida de [place], aumento de ejercicios, dejar de fumar, beber excesivamente o tomar drogas ilegales).  Otros tipos de ciruga:  Extirpar tumores por dentro  o sobre el tero.  Eliminar tejido cicatrizal de dentro del  tero.  Arreglar trompas obstruidas.  Eliminar tejido cicatrizal en la pelvis y alrededor de los rganos femeninos. QU ES LA TECNOLOGA DE REPRODUCCIN ASISTIDA (ART)?  La tecnologa de reproduccin asistida (ART) utiliza mtodos especiales para ayudar a parejas infrtiles. En esta tcnica se manipula tanto el vulo de la mujer como el esperma del hombre. El xito depende de muchos factores. La ART puede ser cara y demandar mucho tiempo. Pero ha hecho posible que muchas parejas tengan hijos que de otra manera no hubieran podido. A continuacin se enumeran algunos mtodos:  Fertilizacin. In Vitro (FIV). In Vitro (IVF) es un procedimiento que se hizo famoso con el nacimiento en [place], el primer "beb de probeta" del mundo. Se utiliza cuando las trompas de [employer] de la mujer estn bloqueadas o cuando el hombre tiene poco esperma. Se utiliza una droga para estimular los ovarios y producir mltiples vulos. Una vez maduros, los vulos se retiran y se Copywriter, advertising en una placa de cultivo con el esperma del hombre para la fertilizacin. Luego de aproximadamente 40 horas, los vulos se examinan para ver si han sido fertilizados por el esperma y se han dividido en clulas. Esos vulos fertilizados (embriones) se colocan luego en el tero de la mujer. Esto evita el paso por las trompas de [place].  La transferencia intrafalopiana de gametas (GIFT) es similar al IVF, pero se utiliza cuando la mujer tiene al menos una trompa de falopio normal. Se colocan de tres a cinco vulos en la trompa de Falopio, junto con el esperma del hombre, para que la fertilizacin se realice dentro del cuerpo de Retail banker.  La transferencia intrafalopiana de cigotos (ZIFT), tambin se denomina transferencia embrionaria, y [place] el IVF con el GIFT. Los vulos retirados de los ovarios de la mujer se Health visitor laboratorio y se Copywriter, advertising en las trompas de [employer] en lugar de en el tero.  Los procedimientos de ART a  menudo suponen la utilizacin de donantes de vulos (de [place] mujer) o de embriones congelados previamente. Los donantes de vulos pueden utilizarse si la mujer tiene Fisher Scientific ovarios o posee una enfermedad gentica que podra transmitirla al beb.  Cuando se realiza una ART hay mayor riesgo de embarazos mltiples, mellizos, trillizos o ms.  La inyeccin de esperma intracitoplasmtica es un procedimiento que inyecta un espermatozoide en el vulo para fertilizarlo.  El trasplante embrionario es un procedimiento que comienza con un embrin que se ha desarrollado en un medio especial (solucin qumica) preparado para mantener el embrin vivo por 2 a 5 das, y Heritage manager. En los [employer] que no puede encontrarse la causa y Water quality scientist no se produce, podr considerarse la adopcin. Document Released: 11/04/2007 Document Revised: 01/07/2012 [facility] Patient Information 2015 [place]. This information is not intended to replace advice given to you by your health care provider. Make sure you discuss any questions you have with your health care provider. Hirsutismo y virilizacin (Hirsutism and Masculinization) El hirsutismo (aumento del vello corporal) es el crecimiento de pelo grueso de color (pigmentado) en las mujeres. Es ms evidente cuando aparece en la zona del bozo o del mentn. Los otros sitios comunes son:   El pecho.  Abdomen.  Los muslos.  La espalda. El desarrollo del vello pbico puede extenderse desde la lnea del bikini hasta la mitad del abdomen.  El virilismo (masculinizacin) es ms extensin que el  hirsutismo. Adems presenta otros sntomas. Pueden ser:   Acn.  Piel grasa.  Calvicie.  Agrandamiento del cltoris.  Aumento del deseo sexual (libido).  Engrosamiento de Microbiologist.  Reduccin del tamao de los senos.  Perodos irregulares o ausentes.  Agresin. El desarrollo del cabello puede seguir un patrn masculino a modo de  calvicie.  CAUSAS  La causa de este trastorno es el exceso de la hormona sexual masculina (andrgeno) en el organismo. Puede producirse en los ovarios, en las glndulas suprarrenales y en la piel. El hirsutismo se relaciona ms frecuentemente con el sndrome de ovrico poliqustico. Los primeros signos de [employer]anto Company niveles de andrgeno son el hirsutismo y Scientist, research (life sciences). La sensibilidad de cada persona a los niveles de hormona es muy variable. El virilismo puede ser el resultado de altos niveles de andrgenos. Algunas mujeres con hirsutismo tienen niveles normales de hormonas. Con el tiempo puede producirse un patrn masculino de calvicie. Estos problemas tambin se relacionan con la dificultad para tener hijos (infertilidad). Adems, tanto los tumores malignos como los benignos pueden causar hirsutismo, como los tumores de las glndulas suprarrenales (adenomas o adenocarcinomas) pero sta no es una causa frecuente.  Existen pruebas de que la resistencia a la insulina puede causar las caractersticas andrginas. Este problema se trata exitosamente con un medicamento para la diabetes (metformina).  Las causas que son Alphonzo Cruise al organismo (exgenas) tambin pueden ocasionar hirsutismo, como por ejemplo el consumo de andrgenos por boca.  Nota: Las mujeres del sudoeste asitico y las zonas este y sudeste de [place] suelen tener vello facial, abdominal y en los muslos, lo que es normal para Engineer, technical sales.  TRATAMIENTO  Existen tratamientos mdicos que inhiben estos trastornos, por ejemplo:   Una combinacin de pldoras anticonceptivas si no trata de quedar embarazada.  Medicamentos que detienen la produccin de hormonas (gonadotrofinas).  Corticoides. Puede utilizarse si existen pruebas de sfilis congnita (presente desde el nacimiento) hiperplasia adrenal (crecimiento anormal de las clulas).  La metformina para el tratamiento de la virilizacin, si es sensible a la insulina.  Supresin de la produccin de  la hormona ovrica con anlogos de GnRH (una hormona). Slo pueden utilizarse por cortos perodos de [place]. Hay una variedad de tratamientos cosmticos. Podra ser todo lo que usted necesita. Pueden ser eficaces contra los problemas transitorios.   La depilacin es el tratamiento ms simple y ms Psychologist, sport and exercise. La decoloracin no es apropiada para los casos de hirsutismo grave.  La depilacin con cera, la depilacin oriental (similar a la cera) y las cremas depilatorias son eficaces. Sin embargo, en ocasiones, pueden dar Environmental consultant a irritacin de la piel (inflamacin) o a una infeccin.  La electrlisis es eficaz. El mdico podr ayudarla a decidir que Publishing rights manager y cul es el curso de tratamiento ms apropiado para usted. El profesional podr derivarla a Technical brewer. ste es un mdico especializado en el tratamiento de los trastornos glandulares.  Document Released: 10/15/2005 Document Revised: 01/07/2012 [facility] Patient Information 2015 [place]. This information is not intended to replace advice given to you by your health care provider. Make sure you discuss any questions you have with your health care provider. Amenorrea secundaria  (Secondary Amenorrhea ) La amenorrea secundaria es la detencin del flujo menstrual durante 3-6 meses en una mujer que previamente tena sus perodos. Hay muchas causas posibles: La mayora de las causas no son graves. Generalmente, al tratar el problema subyacente que causa la detencin de la [place], podr volver a Best boy sus  perodos normales. CAUSAS  Algunas causas comunes de la falta de menstruacin son:  Desnutricin.  Bajo nivel de Dispensing optician (hipoglucemia).  Enfermedad poliqustica de los ovarios.  Estrs o miedos.  Gwendlyn Deutscher.  Desequilibrio hormonal.  Insuficiencia ovrica.  Medicamentos.  Obesidad extrema.  Fibrosis qustica.  Reduccin de peso drstica por cualquier causa.  Menopausia  precoz.  Extirpacin de los ovarios o del tero.  Anticonceptivos.  Enfermedades.  Enfermedades de larga duracin (crnicas).  Sndrome de [place].  Problemas de tiroides.  Pldoras, parches o anillos vaginales para el control de la natalidad. FACTORES DE RIESGO Puede tener ms riesgo de amenorrea secundaria si:  Tiene una historia familiar de este problema.  Sufre un trastorno alimentario.  Realiza entrenamiento deportivo. DIAGNSTICO  Este diagnstico la realiza el mdico por medio de la historia clnica y el examen fsico. Incluir un examen plvico para verificar si hay problemas en los rganos reproductores. Debe descartarse la posibilidad de embarazo. Generalmente se indicarn diferentes anlisis de sangre para medir diferentes tipos de hormonas en el organismo. Le indicarn anlisis de [place]. Le harn algunos estudios especializados (ecografas, tomografa computada, resonancia magntica o histeroscopa) y tambin medirn su ndice de masa corporal [facility]). TRATAMIENTO  El tratamiento depende de la causa de la [place]. Si hay un trastorno de Youth worker, deber tratarse con la dieta y la terapia [place]. Los trastornos crnicos pueden mejorar con el tratamiento de la enfermedad. La amenorrea puede corregirse con medicamentos, cambios en el estilo de vida o con [place]. Si la amenorrea no puede corregirse, algunas veces es posible crear una falsa menstruacin con medicamentos. INSTRUCCIONES PARA EL CUIDADO EN EL HOGAR  Consuma una dieta saludable.  Controle los problemas de [place].  Haga ejercicios con regularidad, pero no excesivamente.  Duerma lo suficiente.  Controle el estrs.  Observe si hay cambios en el ciclo menstrual. Mantenga un registro del momento en que ocurren los perodos. Anote la fecha de inicio de los perodos, cunto duran y si hay problemas. SOLICITE ATENCIN MDICA SI: Los sntomas no mejoran con Dispensing optician. Document Released:  06/17/2013 [facility] Patient Information 2015 [place]. This information is not intended to replace advice given to you by your health care provider. Make sure you discuss any questions you have with your health care provider.

## 2015-05-23 NOTE — Progress Notes (Signed)
    Patient is a 39 year old  Gravida 2 para 2 who was referred to our practice as a courtesy Dr. Brigitte Pulse as a result of patient's chronic vulvitis. Patient is morbidly obese  Whereby she weighs 275 pounds and has a BMI of 50.32 kg her meter square. Patient was recently started on clobetasol for external pruritus by her PCP. It has helped to some extent. She denies any true discharge. She also states that she has been amenorrheic since February. She denies any nausea, vomiting, fever, chills, breast tenderness peer she denies any nipple discharge or any unusual headaches or visual disturbances. She states she is not using any contraception for the past 7 years. Patient also complaining of course chin hairs. She states that she is in the process of being scheduled for gastric bypass. On further question patient stated that many years ago in [place] as a result of a blood transfusion she acquired syphilis. Patient stated she not been sexually active for several years since her husband was in [place] and return back and she has had no other partner. In June 24 of this year her PCP had ordered a hepatitis panel which was all negative.   Exam:   Blood pressure 136/98 on repeat 136/90 Morbidly obese female  Abdomen pendulous  Pelvic: erythematous external genitalia with excoriated areas from patient scratching. Tender on contact. HSV culture obtained  Vagina: hyperemic white discharge noted  , wet prep obtained Cervix: no gross lesions on inspection GC and Chlamydia culture was obtained  uterus: Anteverted upper limits of normal Adnexa: No palpable masses or tenderness Rectal exam not done   Wet prep too numerous to count yeast  HSV culture obtained from external genitalia  GC and Chlamydia culture obtained for the cervix  UPT negative   Assessment/plan: #1 vulvovaginitis  Will be treated with Diflucan 150 mg one by mouth every other day for 3 days. She will continue to use her clobetasol 0.05% cream to  apply externally for the next 7-10 days as well.   #2 morbidly obese is in the process of getting schedule for gastric bypass. After surgery and we can discuss fertility treatment because of her chronic amenorrhea. Her UPT was negative she'll be prescribed Provera 10 mg to take 1 by mouth daily for 10 days of each month if she does not have a spontaneous menses every 30 days.  #3 amenorrhea probably attributed to #2 above. Possibly PCOS  As well contributing to her hirsutism. For this reason we are going to check a DHEAS , 17 hydroxyprogesterone , total testosterone. Because of amenorrhea were also check a TSH and prolactin as well.   #4 hypertension to be followed by her PCP.   All the above was discussed in Spanish and literature information was provided. #4 as part as her STD screening we'll going to check an HIV along with her RPR (patient stated many years ago she had positive syphilis as a result of transfusion [place]?) #3 amenorrhea

## 2015-05-24 LAB — PROLACTIN: Prolactin: 5.5 ng/mL

## 2015-05-24 LAB — HIV ANTIBODY (ROUTINE TESTING W REFLEX): HIV 1&2 Ab, 4th Generation: NONREACTIVE

## 2015-05-24 LAB — RPR TITER: RPR Titer: 1:2 {titer}

## 2015-05-24 LAB — TSH: TSH: 0.038 u[IU]/mL — ABNORMAL LOW (ref 0.350–4.500)

## 2015-05-24 LAB — THYROID PROFILE - CHCC
Free Thyroxine Index: 2.5 (ref 1.4–3.8)
T3 UPTAKE: 29 % (ref 22–35)
T4, Total: 8.5 ug/dL (ref 4.5–12.0)

## 2015-05-24 LAB — RPR: RPR: REACTIVE — AB

## 2015-05-24 LAB — FOLLICLE STIMULATING HORMONE: FSH: 7.3 m[IU]/mL

## 2015-05-24 LAB — GC/CHLAMYDIA PROBE AMP
CT PROBE, AMP APTIMA: NEGATIVE
GC PROBE AMP APTIMA: NEGATIVE

## 2015-05-24 LAB — TESTOSTERONE: TESTOSTERONE: 101 ng/dL — AB (ref 10–70)

## 2015-05-25 ENCOUNTER — Other Ambulatory Visit: Payer: Self-pay | Admitting: Gynecology

## 2015-05-25 ENCOUNTER — Ambulatory Visit: Payer: BLUE CROSS/BLUE SHIELD | Admitting: Family Medicine

## 2015-05-25 LAB — FLUORESCENT TREPONEMAL AB(FTA)-IGG-BLD: FLUORESCENT TREPONEMAL ABS: REACTIVE — AB

## 2015-05-25 LAB — HERPES SIMPLEX VIRUS CULTURE: Organism ID, Bacteria: DETECTED

## 2015-05-25 MED ORDER — VALACYCLOVIR HCL 1 G PO TABS: 1000.0000 mg | ORAL_TABLET | Freq: Two times a day (BID) | ORAL | Status: DC

## 2015-05-25 NOTE — Progress Notes (Signed)
Valtrex 500 mg patient to take 2 tablets twice a day for 7 days

## 2015-05-26 LAB — 17-HYDROXYPROGESTERONE: 17-OH-Progesterone, LC/MS/MS: 123 ng/dL

## 2015-05-26 LAB — DHEA: DHEA: 97 ng/dL — ABNORMAL LOW (ref 102–1185)

## 2015-05-27 ENCOUNTER — Encounter: Payer: Self-pay | Admitting: Family Medicine

## 2015-05-27 ENCOUNTER — Ambulatory Visit (INDEPENDENT_AMBULATORY_CARE_PROVIDER_SITE_OTHER): Payer: BLUE CROSS/BLUE SHIELD | Admitting: Family Medicine

## 2015-05-27 ENCOUNTER — Other Ambulatory Visit: Payer: Self-pay | Admitting: Gynecology

## 2015-05-27 VITALS — BP 163/98 | HR 74 | Temp 99.1°F | Resp 18 | Ht 62.0 in | Wt 272.4 lb

## 2015-05-27 DIAGNOSIS — F418 Other specified anxiety disorders: Secondary | ICD-10-CM | POA: Diagnosis not present

## 2015-05-27 DIAGNOSIS — F32A Depression, unspecified: Secondary | ICD-10-CM

## 2015-05-27 DIAGNOSIS — R7989 Other specified abnormal findings of blood chemistry: Secondary | ICD-10-CM

## 2015-05-27 DIAGNOSIS — I10 Essential (primary) hypertension: Secondary | ICD-10-CM

## 2015-05-27 DIAGNOSIS — F329 Major depressive disorder, single episode, unspecified: Secondary | ICD-10-CM

## 2015-05-27 DIAGNOSIS — E119 Type 2 diabetes mellitus without complications: Secondary | ICD-10-CM

## 2015-05-27 DIAGNOSIS — F419 Anxiety disorder, unspecified: Secondary | ICD-10-CM

## 2015-05-27 DIAGNOSIS — Z9289 Personal history of other medical treatment: Secondary | ICD-10-CM

## 2015-05-27 DIAGNOSIS — R945 Abnormal results of liver function studies: Secondary | ICD-10-CM

## 2015-05-27 LAB — T4, FREE: FREE T4: 1.1 ng/dL (ref 0.80–1.80)

## 2015-05-27 LAB — CBC WITH DIFFERENTIAL/PLATELET
BASOS ABS: 0 10*3/uL (ref 0.0–0.1)
Basophils Relative: 0 % (ref 0–1)
Eosinophils Absolute: 0.1 10*3/uL (ref 0.0–0.7)
Eosinophils Relative: 1 % (ref 0–5)
HEMATOCRIT: 43.8 % (ref 36.0–46.0)
Hemoglobin: 15 g/dL (ref 12.0–15.0)
LYMPHS PCT: 30 % (ref 12–46)
Lymphs Abs: 3.4 10*3/uL (ref 0.7–4.0)
MCH: 28.5 pg (ref 26.0–34.0)
MCHC: 34.2 g/dL (ref 30.0–36.0)
MCV: 83.1 fL (ref 78.0–100.0)
MPV: 9.7 fL (ref 8.6–12.4)
Monocytes Absolute: 0.9 10*3/uL (ref 0.1–1.0)
Monocytes Relative: 8 % (ref 3–12)
Neutro Abs: 7 10*3/uL (ref 1.7–7.7)
Neutrophils Relative %: 61 % (ref 43–77)
Platelets: 358 10*3/uL (ref 150–400)
RBC: 5.27 MIL/uL — ABNORMAL HIGH (ref 3.87–5.11)
RDW: 13.6 % (ref 11.5–15.5)
WBC: 11.4 10*3/uL — ABNORMAL HIGH (ref 4.0–10.5)

## 2015-05-27 LAB — TSH: TSH: 0.064 u[IU]/mL — AB (ref 0.350–4.500)

## 2015-05-27 LAB — GLUCOSE, POCT (MANUAL RESULT ENTRY): POC Glucose: 142 mg/dl — AB (ref 70–99)

## 2015-05-27 LAB — POCT GLYCOSYLATED HEMOGLOBIN (HGB A1C): HEMOGLOBIN A1C: 8.8

## 2015-05-27 NOTE — Progress Notes (Signed)
Subjective:    Patient ID: [name], female    DOB: 1976-05-26, 39 y.o.   MRN: [id number]  05/27/2015  Hypertension; elevated lft; Anxiety; and Depression   HPI This 39 y.o. female presents for one month follow-up:  1. HTN; one month follow-up; taking Metoprolol ER 25mg  daily; taking HCTZ 25mg  daily.    2.  DMII:  Taking Onglyza 5mg  daily; taking Metformin 2 bid; taking Glimepiride 2 tablets daily.  Due for HgbA1c.    3.  Depression and anxiety: Patient reports good compliance with medication, good tolerance to medication, and good symptom control.    4.  HSV genital: just diagnosed; s/p evaluation by gynecology.  Also being treated for candidiasis.  5.  Irregular menses: s/p gynecological evaluation; s/p labs for PCOS.    6. Elevated LFTs: s/p abdominal US revealing fatty liver.  Hepatitis panel negative.  7. Morbid obesity:  B:  Eggs, cereal, milk 2% Snack: none Lunch:  Grilled burger, lettuce, avacado. Supper/dinner:  Beef with potatoes, carrots, water Snack:  Mango or watermelon or papaya.    8.  OSA on CPAP:  Wearing CPAP most nights; did not use last night.  Review of Systems  Constitutional: Negative for fever, chills, diaphoresis and fatigue.  Eyes: Negative for visual disturbance.  Respiratory: Negative for cough and shortness of breath.   Cardiovascular: Negative for chest pain, palpitations and leg swelling.  Gastrointestinal: Negative for nausea, vomiting, abdominal pain, diarrhea and constipation.  Endocrine: Negative for cold intolerance, heat intolerance, polydipsia, polyphagia and polyuria.  Neurological: Negative for dizziness, tremors, seizures, syncope, facial asymmetry, speech difficulty, weakness, light-headedness, numbness and headaches.    Past Medical History  Diagnosis Date  . H. pylori infection     dx  09-02-2014-- treated w/ antibiotic  . GERD (gastroesophageal reflux disease)   . Type 2 diabetes mellitus   . Carpal tunnel syndrome,  bilateral   . PCOS (polycystic ovarian syndrome)   . Menorrhagia   . Irregular menstrual cycle   . Hypertension   . Depression    Past Surgical History  Procedure Laterality Date  . Cesarean section  1995  . Carpal tunnel release Bilateral 10/07/2014    Procedure: CARPAL TUNNEL RELEASE BILATERAL;  Surgeon: [name], MD;  Location: [facility];  Service: Orthopedics;  Laterality: Bilateral;   Allergies  Allergen Reactions  . 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 Shrimp [Shellfish Allergy] Anaphylaxis   Current Outpatient Prescriptions  Medication Sig Dispense Refill  . clobetasol cream (TEMOVATE) 5.62 % Apply 1 application topically 2 (two) times daily. x10d 60 g 0  . EPINEPHrine (EPIPEN 2-PAK) 0.3 mg/0.3 mL IJ SOAJ injection Inject 0.3 mLs (0.3 mg total) into the muscle once. 1 Device 2  . fluconazole (DIFLUCAN) 150 MG tablet Take one PO every other day for 3 days 3 tablet 2  . FLUoxetine (PROZAC) 20 MG tablet Take 2 tablets (40 mg total) by mouth daily. 60 tablet 5  . fluticasone (FLONASE) 50 MCG/ACT nasal spray Place 2 sprays into both nostrils daily. 16 g 12  . glimepiride (AMARYL) 4 MG tablet Take 2 tabs (8 mg) po twice daily. 120 tablet 3  . hydrochlorothiazide (HYDRODIURIL) 25 MG tablet Take 1 tablet (25 mg total) by mouth daily. 90 tablet 3  . medroxyPROGESTERone (PROVERA) 10 MG tablet Take one trablet daily for 10 days of the month if no spontaneous menses every 30 days IF negative home UPT 30 tablet 4  . metFORMIN (GLUCOPHAGE) 500  MG tablet Take 2 tabs by mouth (1000 mg ) 2 times daily. 120 tablet 3  . metoprolol succinate (TOPROL-XL) 25 MG 24 hr tablet Take 1 tablet (25 mg total) by mouth daily. 90 tablet 0  . omeprazole (PRILOSEC) 20 MG capsule Take 1 capsule (20 mg total) by mouth daily. 90 capsule 3  . predniSONE (DELTASONE) 20 MG tablet 3 tabs po daily x 3 days, then 2 tabs x 3 days, then 1.5 tabs x 3 days, then 1 tab x 3 days, then 0.5 tabs x 3 days  27 tablet 0  . saxagliptin HCl (ONGLYZA) 5 MG TABS tablet Take 1 tablet (5 mg total) by mouth daily. 30 tablet 11  . valACYclovir (VALTREX) 1000 MG tablet Take 1 tablet (1,000 mg total) by mouth 2 (two) times daily. 14 tablet 2   No current facility-administered medications for this visit.       Objective:    BP 163/98 mmHg  Pulse 74  Temp(Src) 99.1 F (37.3 C) (Oral)  Resp 18  Ht 5\' 2"  (1.575 m)  Wt 272 lb 6.4 oz (123.56 kg)  BMI 49.81 kg/m2  SpO2 97%  LMP 12/20/2014 Physical Exam  Constitutional: She is oriented to person, place, and time. She appears well-developed and well-nourished. No distress.  HENT:  Head: Normocephalic and atraumatic.  Right Ear: External ear normal.  Left Ear: External ear normal.  Nose: Nose normal.  Mouth/Throat: Oropharynx is clear and moist.  Eyes: Conjunctivae and EOM are normal. Pupils are equal, round, and reactive to light.  Neck: Normal range of motion. Neck supple. Carotid bruit is not present. No thyromegaly present.  Cardiovascular: Normal rate, regular rhythm, normal heart sounds and intact distal pulses.  Exam reveals no gallop and no friction rub.   No murmur heard. Pulmonary/Chest: Effort normal and breath sounds normal. She has no wheezes. She has no rales.  Abdominal: Soft. Bowel sounds are normal. She exhibits no distension and no mass. There is no tenderness. There is no rebound and no guarding.  Lymphadenopathy:    She has no cervical adenopathy.  Neurological: She is alert and oriented to person, place, and time. No cranial nerve deficit.  Skin: Skin is warm and dry. No rash noted. She is not diaphoretic. No erythema. No pallor.  Psychiatric: She has a normal mood and affect. Her behavior is normal.   Results for orders placed or performed in visit on 05/27/15  POCT glycosylated hemoglobin (Hb A1C)  Result Value Ref Range   Hemoglobin A1C 8.8   POCT glucose (manual entry)  Result Value Ref Range   POC Glucose 142 (A) 70 -  99 mg/dl       Assessment & Plan:   1. Type 2 diabetes mellitus without complication   2. Essential hypertension   3. Elevated LFTs   4. Anxiety and depression   5. Abnormal TSH   6. Morbid obesity     No orders of the defined types were placed in this encounter.    Return in about 4 weeks (around 06/24/2015) for recheck.    Rasmus Preusser Elayne Guerin, M.D. [facility] [address] [place], [place]  [address] [phone] phone [phone] fax

## 2015-05-28 LAB — COMPREHENSIVE METABOLIC PANEL
ALT: 60 U/L — AB (ref 6–29)
AST: 27 U/L (ref 10–30)
Albumin: 4.3 g/dL (ref 3.6–5.1)
Alkaline Phosphatase: 96 U/L (ref 33–115)
BUN: 15 mg/dL (ref 7–25)
CO2: 25 mmol/L (ref 20–31)
Calcium: 9.4 mg/dL (ref 8.6–10.2)
Chloride: 101 mmol/L (ref 98–110)
Creat: 0.54 mg/dL (ref 0.50–1.10)
Glucose, Bld: 148 mg/dL — ABNORMAL HIGH (ref 65–99)
POTASSIUM: 3.9 mmol/L (ref 3.5–5.3)
Sodium: 139 mmol/L (ref 135–146)
Total Bilirubin: 0.4 mg/dL (ref 0.2–1.2)
Total Protein: 7.4 g/dL (ref 6.1–8.1)

## 2015-05-31 MED ORDER — CANAGLIFLOZIN 100 MG PO TABS: 100.0000 mg | ORAL_TABLET | Freq: Every day | ORAL | Status: DC

## 2015-05-31 NOTE — Addendum Note (Signed)
Addended by: Wardell Honour on: 05/31/2015 10:15 AM   Modules accepted: Orders

## 2015-06-11 ENCOUNTER — Other Ambulatory Visit: Payer: Self-pay | Admitting: *Deleted

## 2015-06-11 DIAGNOSIS — E119 Type 2 diabetes mellitus without complications: Secondary | ICD-10-CM

## 2015-06-11 MED ORDER — GLIMEPIRIDE 4 MG PO TABS: ORAL_TABLET | ORAL | Status: DC

## 2015-06-22 ENCOUNTER — Encounter: Payer: Self-pay | Admitting: Neurology

## 2015-06-22 ENCOUNTER — Ambulatory Visit (INDEPENDENT_AMBULATORY_CARE_PROVIDER_SITE_OTHER): Payer: BLUE CROSS/BLUE SHIELD | Admitting: Neurology

## 2015-06-22 VITALS — BP 108/64 | HR 62 | Resp 18 | Ht 62.0 in | Wt 274.0 lb

## 2015-06-22 DIAGNOSIS — Z9989 Dependence on other enabling machines and devices: Secondary | ICD-10-CM

## 2015-06-22 DIAGNOSIS — G4733 Obstructive sleep apnea (adult) (pediatric): Secondary | ICD-10-CM | POA: Diagnosis not present

## 2015-06-22 NOTE — Progress Notes (Signed)
Subjective:    Patient ID: [name] is a 39 y.o. female.  HPI     Interim history:   [name] is a very friendly 39 year old right-handed woman with an underlying medical history of morbid obesity, depression, irregular menses with PCOS, hypertension, type 2 diabetes, and reflux disease, who presents for follow-up consultation of her sleep disorder, after her recent sleep study. The patient is unaccompanied today. I first met her on 03/17/2015 at the request of her primary care physician, at which time the patient reported snoring, weight gain, and excessive daytime somnolence. She was invited back for sleep study. She had a split night sleep study on 04/11/2015 and went over her test results in detail with her today. Her sleep baseline sleep efficiency was reduced at 82.5% with a latency to sleep of 3 minutes and wake after sleep onset of 24.5 minutes with severe sleep fragmentation noted. She had a markedly elevated arousal index. She had an increased percentage of light stage sleep, 13.5% of slow-wave sleep and absence of REM sleep. She had no significant PLMS or EKG changes or EEG changes. She had one episode of vomiting after which she felt better. She did complain of a sore throat. She had moderate to loud snoring. Her AHI was 114 per hour. Average oxygen saturation 92%, nadir was 77%. She was then placed on CPAP therapy. Sleep efficiency was 94.4% with a latency to sleep of 5.5 minutes and wake after sleep onset of 14.5 minutes with mild sleep fragmentation noted. Arousal index was normal and much improved. She had an increased percentage of REM sleep at 38.6%. Slow-wave sleep was 17%. She was titrated on CPAP, pressure of 5 cm to 14 cm. Average oxygen saturation was 94%, nadir was 78%. Oxygen nadir was 90% on the final pressure. Supine REM sleep was achieved. Based on her test results I prescribed CPAP therapy for home use.  Today, 06/22/2015: I reviewed her CPAP compliance data from  05/20/2015 through 06/18/2015 which is a total of 30 days during which time she used her machine only 11 days with percent used days greater than 4 hours at only 27%, indicating poor compliance with an average usage of 4 hours and 53 minutes for days on machine, average usage for all days only 1 hour and 47 minutes, residual AHI borderline at 6.6 per hour, leak at times high for the 95th percentile at 36.9 L/m on a pressure of 14 cm with EPR of 3.  Today, 06/22/2015: She reports that she often forgets to use the mask. She admits that she's not been fully compliant with treatment. She reports that she has had her in-laws in town since early July. Her in-laws are using her bedroom and she is trying to sleep in the family room on the couch. She does add that they are leaving this weekend. She will drive her in-laws to [place] so they can catch a flight to [place] from there. She does reassure me that when she feels tired or sleepy she will pull over and take a rest. She is doing better with her diabetes management and is taking her medications. She now has reading glasses. She has lost about 5 pounds she proudly reports as well. Of note, she presented to the emergency room on 05/08/2015 with symptoms of an allergic reaction to shrimp. She was treated symptomatically with EpiPen, Solu-Medrol, Benadryl and was discharged after observation with a prescription for an oral prednisone taper, and a prescription for an EpiPen. She  has a new job as of 2 weeks ago, in stocking.   Previously:   03/17/2015: She reports snoring, and excessive daytime somnolence and weight gain despite trying to lose weight. She is in the process of investigating weight loss surgery.   She endorses loud snoring and gasping sensations and witness apneas, per husband. She has nocturia 1-4 times per night. She has occasional morning headaches.   She does not smoke, drinks alcohol occasionally and drinks soda occasionally, no coffee, no tea.   She  has occasional L knee pain and had b/l CTS surgery. She has occasional leg cramps and no frank RLS symptoms or leg twitching in sleep. 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 She does not typically take a nap but sometimes when there is no children in the past she leans her head against a seat and may doze off for up to 10 minutes. She is not aware of any family history of obstructive sleep apnea.  Her typical bedtime is reported to be around 10 PM and usual wake time is around 5 AM. Sleep onset typically occurs within 1-3 hours. She reports feeling marginally rested upon awakening. Her Epworth Sleepiness Scale is 9 out of 24 and her fatigue score is 24 out of 63 today.   She lives with her husband, her 30 year old son, her 25 year old daughter, and her 44-year-old granddaughter. She has 3 dogs and one parrots.   Her Past Medical History Is Significant For: Past Medical History  Diagnosis Date  . H. pylori infection     dx  09-02-2014-- treated w/ antibiotic  . GERD (gastroesophageal reflux disease)   . Type 2 diabetes mellitus   . Carpal tunnel syndrome, bilateral   . PCOS (polycystic ovarian syndrome)   . Menorrhagia   . Irregular menstrual cycle   . Hypertension   . Depression     Her Past Surgical History Is Significant For: Past Surgical History  Procedure Laterality Date  . Cesarean section  1995  . Carpal tunnel release Bilateral 10/07/2014    Procedure: CARPAL TUNNEL RELEASE BILATERAL;  Surgeon: [name], MD;  Location: [facility];  Service: Orthopedics;  Laterality: Bilateral;    Her Family History Is Significant For: Family History  Problem Relation Age of Onset  . Heart disease Mother   . Diabetes Mother   . Hypertension Mother   . Cancer Father     Her Social History Is Significant For: Social History   Social History  . Marital Status: Married    Spouse Name: N/A  . Number of Children: 2  .  Years of Education: N/A   Social History Main Topics  . Smoking status: Never Smoker   . Smokeless tobacco: Never Used  . Alcohol Use: 0.0 oz/week    0 Standard drinks or equivalent per week     Comment: socially  . Drug Use: No  . Sexual Activity:    Partners: Male    Birth Control/ Protection: None   Other Topics Concern  . None   Social History Narrative   No reported caffeine use     Her Allergies Are:  Allergies  Allergen Reactions  . 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 Shrimp [Shellfish Allergy] Anaphylaxis  :   Her Current Medications Are:  Outpatient Encounter Prescriptions as of 06/22/2015  Medication Sig  . canagliflozin (INVOKANA) 100 MG TABS tablet Take 1 tablet (100 mg total) by mouth daily.  Marland Kitchen  clobetasol cream (TEMOVATE) 7.35 % Apply 1 application topically 2 (two) times daily. x10d  . EPINEPHrine (EPIPEN 2-PAK) 0.3 mg/0.3 mL IJ SOAJ injection Inject 0.3 mLs (0.3 mg total) into the muscle once.  . fluconazole (DIFLUCAN) 150 MG tablet Take one PO every other day for 3 days  . FLUoxetine (PROZAC) 20 MG tablet Take 2 tablets (40 mg total) by mouth daily.  . fluticasone (FLONASE) 50 MCG/ACT nasal spray Place 2 sprays into both nostrils daily.  Marland Kitchen glimepiride (AMARYL) 4 MG tablet Take 2 tabs (8 mg) po twice daily.  . hydrochlorothiazide (HYDRODIURIL) 25 MG tablet Take 1 tablet (25 mg total) by mouth daily.  . medroxyPROGESTERone (PROVERA) 10 MG tablet Take one trablet daily for 10 days of the month if no spontaneous menses every 30 days IF negative home UPT  . metFORMIN (GLUCOPHAGE) 500 MG tablet Take 2 tabs by mouth (1000 mg ) 2 times daily.  . metoprolol succinate (TOPROL-XL) 25 MG 24 hr tablet Take 1 tablet (25 mg total) by mouth daily.  Marland Kitchen omeprazole (PRILOSEC) 20 MG capsule Take 1 capsule (20 mg total) by mouth daily.  . predniSONE (DELTASONE) 20 MG tablet 3 tabs po daily x 3 days, then 2 tabs x 3 days, then 1.5 tabs x 3 days, then 1 tab x 3 days, then 0.5 tabs  x 3 days  . saxagliptin HCl (ONGLYZA) 5 MG TABS tablet Take 1 tablet (5 mg total) by mouth daily.  . valACYclovir (VALTREX) 1000 MG tablet Take 1 tablet (1,000 mg total) by mouth 2 (two) times daily.   No facility-administered encounter medications on file as of 06/22/2015.  :  Review of Systems:  Out of a complete 14 point review of systems, all are reviewed and negative with the exception of these symptoms as listed below: Review of Systems  Neurological:       Patient states that she has trouble keeping the CPAP mask on at night. Reports forgetting to use it.     Objective:  Neurologic Exam  Physical Exam Physical Examination:   Filed Vitals:   06/22/15 1644  BP: 108/64  Pulse: 62  Resp: 18   General Examination: The patient is a very pleasant 39 y.o. female in no acute distress. She appears well-developed and well-nourished and adequately groomed. She is morbidly obese.   HEENT: Normocephalic, atraumatic, pupils are equal, round and reactive to light and accommodation. Funduscopic exam is normal with sharp disc margins noted. Extraocular tracking is good without limitation to gaze excursion or nystagmus noted. Normal smooth pursuit is noted. Hearing is grossly intact. Face is symmetric with normal facial animation and normal facial sensation. Speech is clear with no dysarthria noted. There is no hypophonia. There is no lip, neck/head, jaw or voice tremor. Neck is supple with full range of passive and active motion. There are no carotid bruits on auscultation. Oropharynx exam reveals: mild mouth dryness, adequate dental hygiene and marked airway crowding, due to narrow airway entry, redundant soft palate and tonsils of 2+. Mallampati is class III. Tongue protrudes centrally and palate elevates symmetrically. She has a mild overbite. Nasal inspection reveals no significant nasal mucosal bogginess or redness and no septal deviation.   Chest: Clear to auscultation without wheezing,  rhonchi or crackles noted.  Heart: S1+S2+0, regular and normal without murmurs, rubs or gallops noted.   Abdomen: Soft, non-tender and non-distended with normal bowel sounds appreciated on auscultation.  Extremities: There is no pitting edema in the distal lower extremities bilaterally.  Pedal pulses are intact.  Skin: Warm and dry without trophic changes noted. There are no varicose veins.  Musculoskeletal: exam reveals no obvious joint deformities, tenderness or joint swelling or erythema.   Neurologically:  Mental status: The patient is awake, alert and oriented in all 4 spheres. Her immediate and remote memory, attention, language skills and fund of knowledge are appropriate. There is no evidence of aphasia, agnosia, apraxia or anomia. Speech is clear with normal prosody and enunciation. Thought process is linear. Mood is normal and affect is blunted.  Cranial nerves II - XII are as described above under HEENT exam. In addition: shoulder shrug is normal with equal shoulder height noted. Motor exam: Normal bulk, strength and tone is noted. There is no drift, tremor or rebound. Romberg is negative. Reflexes are 2+ throughout. Fine motor skills and coordination: intact.  Cerebellar testing: No dysmetria or intention tremor on finger to nose testing. Heel to shin is difficult for her due to body habitus. There is no truncal or gait ataxia.  Sensory exam: intact to light touch in the upper and lower extremities.  Gait, station and balance: She stands easily. No veering to one side is noted. No leaning to one side is noted. Posture is age-appropriate and stance is narrow based. Gait shows normal stride length and normal pace. No problems turning are noted. She turns en bloc. Tandem walk is Difficult for her due to body habitus.      Assessment and Plan:   In summary, [name] is a very pleasant 39.-year old female with an underlying medical history of morbid obesity, depression, irregular  menses with PCOS, hypertension, type 2 diabetes, and reflux disease, who presents for follow up consultation of her severe OSA as determined by a split-night sleep study on 04/11/2015. I went over her test results with her in detail today and I explained her compliance data to her as well. She is strongly encouraged to be compliant with CPAP treatment. She seems motivated to pursue treatment. She had family in town and it was difficult for her to keep a sleep schedule and she was also not sleeping in her own bed since early July. She reports that she has a new job as of 2 weeks ago. She is furthermore again advised regarding the risks and ramifications of untreated severe obstructive sleep apnea. She is strongly encouraged to pursue weight loss and while she does report a 5 pound weight loss, she seems to have gained weight a little recently. Her exam is stable. I explained the importance of being compliant with PAP treatment, not only for insurance purposes but primarily to improve Her symptoms, and for the patient's long term health benefit, including to reduce Her cardiovascular risks. I asked her to return in 3 months for recheck. I answered all her questions today and the patient was in agreement.  I spent 20 minutes in total face-to-face time with the patient, more than 50% of which was spent in counseling and coordination of care, reviewing test results, reviewing medication and discussing or reviewing the diagnosis of OSA, its prognosis and treatment options.

## 2015-06-22 NOTE — Patient Instructions (Signed)
Please continue using your CPAP regularly. While your insurance requires that you use CPAP at least 4 hours each night on 70% of the nights, I recommend, that you not skip any nights and use it throughout the night if you can. Getting used to CPAP and staying with the treatment long term does take time and patience and discipline. Untreated obstructive sleep apnea when it is moderate to severe can have an adverse impact on cardiovascular health and raise her risk for heart disease, arrhythmias, hypertension, congestive heart failure, stroke and diabetes. Untreated obstructive sleep apnea causes sleep disruption, nonrestorative sleep, and sleep deprivation. This can have an impact on your day to day functioning and cause daytime sleepiness and impairment of cognitive function, memory loss, mood disturbance, and problems focussing. Using CPAP regularly can improve these symptoms.  Follow up in 3 months.

## 2015-06-23 ENCOUNTER — Telehealth: Payer: Self-pay

## 2015-06-23 NOTE — Telephone Encounter (Signed)
Called and left message for pt to call us back to schedule 3 month f/u with Dr. Rexene Alberts...she checked out with me when [facility] was down on 06/22/2015.Marland KitchenMarland Kitchenplease advise

## 2015-06-27 ENCOUNTER — Ambulatory Visit: Payer: BLUE CROSS/BLUE SHIELD | Admitting: Family Medicine

## 2015-06-29 ENCOUNTER — Encounter: Payer: Self-pay | Admitting: Family Medicine

## 2015-06-29 ENCOUNTER — Ambulatory Visit (INDEPENDENT_AMBULATORY_CARE_PROVIDER_SITE_OTHER): Payer: BLUE CROSS/BLUE SHIELD | Admitting: Family Medicine

## 2015-06-29 VITALS — BP 90/60 | HR 70 | Temp 98.4°F | Resp 16 | Ht 62.0 in | Wt 274.4 lb

## 2015-06-29 DIAGNOSIS — Z9989 Dependence on other enabling machines and devices: Secondary | ICD-10-CM | POA: Insufficient documentation

## 2015-06-29 DIAGNOSIS — R7989 Other specified abnormal findings of blood chemistry: Secondary | ICD-10-CM

## 2015-06-29 DIAGNOSIS — E1165 Type 2 diabetes mellitus with hyperglycemia: Secondary | ICD-10-CM | POA: Diagnosis not present

## 2015-06-29 DIAGNOSIS — J301 Allergic rhinitis due to pollen: Secondary | ICD-10-CM | POA: Diagnosis not present

## 2015-06-29 DIAGNOSIS — IMO0002 Reserved for concepts with insufficient information to code with codable children: Secondary | ICD-10-CM

## 2015-06-29 DIAGNOSIS — I10 Essential (primary) hypertension: Secondary | ICD-10-CM

## 2015-06-29 DIAGNOSIS — F418 Other specified anxiety disorders: Secondary | ICD-10-CM | POA: Diagnosis not present

## 2015-06-29 DIAGNOSIS — N926 Irregular menstruation, unspecified: Secondary | ICD-10-CM

## 2015-06-29 DIAGNOSIS — Z23 Encounter for immunization: Secondary | ICD-10-CM

## 2015-06-29 DIAGNOSIS — G4733 Obstructive sleep apnea (adult) (pediatric): Secondary | ICD-10-CM

## 2015-06-29 DIAGNOSIS — K219 Gastro-esophageal reflux disease without esophagitis: Secondary | ICD-10-CM | POA: Diagnosis not present

## 2015-06-29 DIAGNOSIS — F32A Depression, unspecified: Secondary | ICD-10-CM

## 2015-06-29 DIAGNOSIS — F329 Major depressive disorder, single episode, unspecified: Secondary | ICD-10-CM

## 2015-06-29 DIAGNOSIS — E119 Type 2 diabetes mellitus without complications: Secondary | ICD-10-CM | POA: Diagnosis not present

## 2015-06-29 DIAGNOSIS — F419 Anxiety disorder, unspecified: Secondary | ICD-10-CM

## 2015-06-29 LAB — COMPREHENSIVE METABOLIC PANEL
ALBUMIN: 4.4 g/dL (ref 3.6–5.1)
ALK PHOS: 76 U/L (ref 33–115)
ALT: 30 U/L — ABNORMAL HIGH (ref 6–29)
AST: 20 U/L (ref 10–30)
BILIRUBIN TOTAL: 0.4 mg/dL (ref 0.2–1.2)
BUN: 24 mg/dL (ref 7–25)
CALCIUM: 9.3 mg/dL (ref 8.6–10.2)
CO2: 28 mmol/L (ref 20–31)
Chloride: 102 mmol/L (ref 98–110)
Creat: 1.06 mg/dL (ref 0.50–1.10)
GLUCOSE: 104 mg/dL — AB (ref 65–99)
POTASSIUM: 3.8 mmol/L (ref 3.5–5.3)
Sodium: 139 mmol/L (ref 135–146)
Total Protein: 6.9 g/dL (ref 6.1–8.1)

## 2015-06-29 LAB — GLUCOSE, POCT (MANUAL RESULT ENTRY): POC Glucose: 101 mg/dl — AB (ref 70–99)

## 2015-06-29 MED ORDER — METFORMIN HCL 500 MG PO TABS: ORAL_TABLET | ORAL | Status: DC

## 2015-06-29 NOTE — Patient Instructions (Signed)
1.  Take Lisinopril 20mg  1/2 tablet daily. 2. Take 4 Metformin daily.

## 2015-06-29 NOTE — Progress Notes (Signed)
Subjective:    Patient ID: [name], female    DOB: 1976-01-14, 39 y.o.   MRN: [id number]  06/29/2015  Follow-up; Diabetes; Hypertension; Depression; and left leg   HPI This 39 y.o. female presents for one month follow-up:  1. HTN:  No changes to management made at last visit.  Does not check BP at home.  Last week, neurology BP reading of 106/60.  Today 90/60. Taking 3 blood pressure pills daily now. Lisinopril 20mg  one daily, Metoprolol ER 25mg  one daily, HCTZ 25mg  one daily.   Denies dizziness.  2. DMII: adding Invokana 100mg  daily at last visit.  AM readings running 79, 80, 90, 102.  In afternoon, running 160, 120.  One time, 150.   Needs strips and lancets.  Only take 2 Metformin daily now since readings have been so good.  Taking Invokana 100mg  daily, Onglyza 5mg  daily, Glimiepride 2 daily, Metformin 2 daily. No low sugars.  3.  Obesity: Has not been contacted with appointment at bariatric clinic.  S/p took class/seminar on April 12, 2015.  Signed papers during class and no one has contacted pt .  Office states that they cannot find pt's paperwork.  4. Elevated LFTs:  Decreasing at last visit; due for repeat.   5. OSA on CPAP: poor compliance with CPAP; recent follow-up with neurology.   6. Anxiety and depression:  Patient reports good compliance with medication, good tolerance to medication, and good symptom control.  Now taking Prozac 40mg  daily.  Emotionally doing well.  7.  Irregular menses:  Needs to follow-up with gynecology; also needs more blood work and pelvic US.   Using Provera PRN to initiate menses.  8. GERD: only using Prilosec PRN.  9. Obesity: continues to work on weight loss and increased activity level. No weight loss in past month. 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 Having more lower back pain with burning pain in L thigh intermittently; no  weakness in leg; no saddle paresthesias; no b/b dysfunction.  10. Abnormal TSH; referred to endocrinology at last visit. Already a patient of Dr. Cruzita Lederer; last appointment 10/06/2014.  No appointment scheduled with referral request.   Review of Systems  Constitutional: Negative for fever, chills, diaphoresis, activity change, appetite change, fatigue and unexpected weight change.  HENT: Negative for congestion, dental problem, drooling, ear discharge, ear pain, facial swelling, hearing loss, mouth sores, nosebleeds, postnasal drip, rhinorrhea, sinus pressure, sneezing, sore throat, tinnitus, trouble swallowing and voice change.   Eyes: Negative for photophobia, pain, discharge, redness, itching and visual disturbance.  Respiratory: Negative for apnea, cough, choking, chest tightness, shortness of breath, wheezing and stridor.   Cardiovascular: Negative for chest pain, palpitations and leg swelling.  Gastrointestinal: Negative for nausea, vomiting, abdominal pain, diarrhea, constipation, blood in stool, abdominal distention, anal bleeding and rectal pain.  Endocrine: Negative for cold intolerance, heat intolerance, polydipsia, polyphagia and polyuria.  Genitourinary: Negative for dysuria, urgency, frequency, hematuria, flank pain, decreased urine volume, vaginal bleeding, vaginal discharge, enuresis, difficulty urinating, genital sores, vaginal pain, menstrual problem, pelvic pain and dyspareunia.  Musculoskeletal: Positive for back pain. Negative for myalgias, joint swelling, arthralgias, gait problem, neck pain and neck stiffness.  Skin: Negative for color change, pallor, rash and wound.  Allergic/Immunologic: Negative for environmental allergies, food allergies and immunocompromised state.  Neurological: Positive for numbness. Negative for dizziness, tremors, seizures, syncope, facial asymmetry, speech difficulty,  weakness, light-headedness and headaches.  Hematological: Negative for adenopathy.  Does not bruise/bleed easily.  Psychiatric/Behavioral: Negative for suicidal ideas, hallucinations, behavioral problems, confusion, sleep disturbance, self-injury, dysphoric mood, decreased concentration and agitation. The patient is not nervous/anxious and is not hyperactive.     Past Medical History  Diagnosis Date  . H. pylori infection     dx  09-02-2014-- treated w/ antibiotic  . GERD (gastroesophageal reflux disease)   . Type 2 diabetes mellitus   . Carpal tunnel syndrome, bilateral   . PCOS (polycystic ovarian syndrome)   . Menorrhagia   . Irregular menstrual cycle   . Hypertension   . Depression    Past Surgical History  Procedure Laterality Date  . Cesarean section  1995  . Carpal tunnel release Bilateral 10/07/2014    Procedure: CARPAL TUNNEL RELEASE BILATERAL;  Surgeon: [name], MD;  Location: [facility];  Service: Orthopedics;  Laterality: Bilateral;   Allergies  Allergen Reactions  . 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 Shrimp [Shellfish Allergy] Anaphylaxis   Current Outpatient Prescriptions  Medication Sig Dispense Refill  . canagliflozin (INVOKANA) 100 MG TABS tablet Take 1 tablet (100 mg total) by mouth daily. 30 tablet 2  . clobetasol cream (TEMOVATE) 6.75 % Apply 1 application topically 2 (two) times daily. x10d 60 g 0  . EPINEPHrine (EPIPEN 2-PAK) 0.3 mg/0.3 mL IJ SOAJ injection Inject 0.3 mLs (0.3 mg total) into the muscle once. 1 Device 2  . FLUoxetine (PROZAC) 20 MG tablet Take 2 tablets (40 mg total) by mouth daily. 60 tablet 5  . fluticasone (FLONASE) 50 MCG/ACT nasal spray Place 2 sprays into both nostrils daily. 16 g 12  . glimepiride (AMARYL) 4 MG tablet Take 2 tabs (8 mg) po twice daily. 120 tablet 3  . hydrochlorothiazide (HYDRODIURIL) 25 MG tablet Take 1 tablet (25 mg total) by mouth daily. 90 tablet 3  . medroxyPROGESTERone (PROVERA) 10 MG tablet Take one trablet daily for 10 days of the month if no spontaneous menses every  30 days IF negative home UPT 30 tablet 4  . metFORMIN (GLUCOPHAGE) 500 MG tablet Take 2 tabs by mouth (1000 mg ) 2 times daily. 120 tablet 11  . metoprolol succinate (TOPROL-XL) 25 MG 24 hr tablet Take 1 tablet (25 mg total) by mouth daily. 90 tablet 0  . omeprazole (PRILOSEC) 20 MG capsule Take 1 capsule (20 mg total) by mouth daily. 90 capsule 3  . saxagliptin HCl (ONGLYZA) 5 MG TABS tablet Take 1 tablet (5 mg total) by mouth daily. 30 tablet 11  . valACYclovir (VALTREX) 1000 MG tablet Take 1 tablet (1,000 mg total) by mouth 2 (two) times daily. 14 tablet 2   No current facility-administered medications for this visit.   Social History   Social History  . Marital Status: Married    Spouse Name: N/A  . Number of Children: 2  . Years of Education: N/A   Occupational History  . Not on file.   Social History Main Topics  . Smoking status: Never Smoker   . Smokeless tobacco: Never Used  . Alcohol Use: 0.0 oz/week    0 Standard drinks or equivalent per week     Comment: socially  . Drug Use: No  . Sexual Activity:    Partners: Male    Birth Control/ Protection: None   Other Topics Concern  . Not on file   Social History Narrative   No reported caffeine use    Family History  Problem Relation Age of Onset  . Heart disease Mother   . Diabetes Mother   . Hypertension Mother   . Cancer Father        Objective:    BP 90/60 mmHg  Pulse 70  Temp(Src) 98.4 F (36.9 C) (Oral)  Resp 16  Ht 5\' 2"  (1.575 m)  Wt 274 lb 6.4 oz (124.467 kg)  BMI 50.18 kg/m2  SpO2 98%  LMP 06/10/2015 Physical Exam  Constitutional: She is oriented to person, place, and time. She appears well-developed and well-nourished. No distress.  Morbidly obese.  HENT:  Head: Normocephalic and atraumatic.  Right Ear: External ear normal.  Left Ear: External ear normal.  Nose: Nose normal.  Mouth/Throat: Oropharynx is clear and moist.  Eyes: Conjunctivae and EOM are normal. Pupils are equal, round,  and reactive to light.  Neck: Normal range of motion. Neck supple. Carotid bruit is not present. No thyromegaly present.  Cardiovascular: Normal rate, regular rhythm, normal heart sounds and intact distal pulses.  Exam reveals no gallop and no friction rub.   No murmur heard. Pulmonary/Chest: Effort normal and breath sounds normal. She has no wheezes. She has no rales.  Abdominal: Soft. Bowel sounds are normal. She exhibits no distension and no mass. There is no tenderness. There is no rebound and no guarding.  Lymphadenopathy:    She has no cervical adenopathy.  Neurological: She is alert and oriented to person, place, and time. No cranial nerve deficit.  Skin: Skin is warm and dry. No rash noted. She is not diaphoretic. No erythema. No pallor.  Psychiatric: She has a normal mood and affect. Her behavior is normal.   Results for orders placed or performed in visit on 06/29/15  POCT glucose (manual entry)  Result Value Ref Range   POC Glucose 101 (A) 70 - 99 mg/dl   TDAP, PNEUMOVAX, AND INFLUENZA VACCINES ADMINISTERED.    Assessment & Plan:   1. Type II diabetes mellitus, uncontrolled   2. Severe obesity (BMI >= 40)   3. Irregular menstrual cycle   4. Essential hypertension, benign   5. Allergic rhinitis due to pollen   6. Anxiety and depression   7. Gastroesophageal reflux disease without esophagitis   8. OSA on CPAP   9. Type 2 diabetes mellitus without complication   10. Need for prophylactic vaccination and inoculation against influenza   11. Need for pneumococcal vaccination   12. Need for Tdap vaccination   13. Abnormal TSH     1. DMII: improved control; added Invokana 100mg  daily at last visit; pt now compliant with medications with improved glycemic control; recommend taking 4 Metformin daily. Obtain labs. 2.  Severe obesity: highly recommend weight loss, exercise, low-caloric food choices. 3.  Irregular menses: undergoing gynecological work up; using Provera PRN. 4.   HTN: controlled and now borderline low since taking Lisinopril 20mg , HCTZ 25mg , and Metoprolol ER 25 mg daily.  Decrease Lisinopril 20mg  to 1/2 daily. Obtain labs. 4. Anxiety and depression: controlled with improved compliance with Prozac 40mg  daily. 5.  GERD: stable; using Prilosec 20mg  PRN. 6.  OSA on CPAP: poor compliance with CPAP; recent follow up with neurology sleep. 7. S/p TDAP, Pneumovax, influenza vaccines. 8. Elevated LFTs: repeat today.  Abdominal US 01/2015 revealed diffuse fatty infiltration of liver.  Hepatitis panel 04/22/2015 negative 9. Abnormal TSH: referred to endocrinology at last visit; established pt with endocrinology; appointment not scheduled for patient apparently with referral request. 10. Obesity morbid: s/p seminar for gastric bypass;  awaiting appointment with bariatric clinic.   Orders Placed This Encounter  Procedures  . Tdap vaccine greater than or equal to 7yo IM  . Flu Vaccine QUAD 36+ mos IM  . Pneumococcal polysaccharide vaccine 23-valent greater than or equal to 2yo subcutaneous/IM  . Comprehensive metabolic panel  . POCT glucose (manual entry)  . HM Diabetes Foot Exam   Meds ordered this encounter  Medications  . metFORMIN (GLUCOPHAGE) 500 MG tablet    Sig: Take 2 tabs by mouth (1000 mg ) 2 times daily.    Dispense:  120 tablet    Refill:  11    Return in about 4 weeks (around 07/27/2015) for recheck blood pressure, weight.    Youssef Footman Elayne Guerin, M.D. [facility] [address] [place], [place]  [address] [phone] phone [phone] fax

## 2015-07-05 ENCOUNTER — Other Ambulatory Visit: Payer: BLUE CROSS/BLUE SHIELD

## 2015-07-05 DIAGNOSIS — R7989 Other specified abnormal findings of blood chemistry: Secondary | ICD-10-CM

## 2015-07-05 DIAGNOSIS — Z9289 Personal history of other medical treatment: Secondary | ICD-10-CM

## 2015-07-05 LAB — THYROID PANEL WITH TSH
Free Thyroxine Index: 2.1 (ref 1.4–3.8)
T3 Uptake: 26 % (ref 22–35)
T4 TOTAL: 8.2 ug/dL (ref 4.5–12.0)
TSH: 0.08 u[IU]/mL — ABNORMAL LOW (ref 0.350–4.500)

## 2015-07-08 LAB — RPR

## 2015-07-12 ENCOUNTER — Telehealth: Payer: Self-pay | Admitting: *Deleted

## 2015-07-12 DIAGNOSIS — R7989 Other specified abnormal findings of blood chemistry: Secondary | ICD-10-CM

## 2015-07-12 NOTE — Telephone Encounter (Signed)
-----   Message from Ramond Craver, [place] sent at 07/08/2015  4:03 PM EDT ----- Regarding: referral to Dr. Cruzita Lederer Per Dr. Moshe Salisbury "Please inform patient that I would like to refer her to endocrinologist Dr. Meta Hatchet because of her low TSH. She may have underlying Hashimoto's thyroiditis or subclinical hypothyroidism."  Patient said appt must be after 3:30pm "Like 3:45 or 4:00".   Thanks!

## 2015-07-12 NOTE — Telephone Encounter (Signed)
Referral placed they will contact pt to schedule. 

## 2015-07-13 ENCOUNTER — Other Ambulatory Visit: Payer: Self-pay | Admitting: Gynecology

## 2015-07-13 ENCOUNTER — Encounter: Payer: Self-pay | Admitting: Gynecology

## 2015-07-13 ENCOUNTER — Ambulatory Visit (INDEPENDENT_AMBULATORY_CARE_PROVIDER_SITE_OTHER): Payer: BLUE CROSS/BLUE SHIELD

## 2015-07-13 ENCOUNTER — Ambulatory Visit (INDEPENDENT_AMBULATORY_CARE_PROVIDER_SITE_OTHER): Payer: BLUE CROSS/BLUE SHIELD | Admitting: Gynecology

## 2015-07-13 VITALS — BP 126/78

## 2015-07-13 DIAGNOSIS — E0590 Thyrotoxicosis, unspecified without thyrotoxic crisis or storm: Secondary | ICD-10-CM | POA: Diagnosis not present

## 2015-07-13 DIAGNOSIS — E282 Polycystic ovarian syndrome: Secondary | ICD-10-CM

## 2015-07-13 DIAGNOSIS — N915 Oligomenorrhea, unspecified: Secondary | ICD-10-CM

## 2015-07-13 DIAGNOSIS — L680 Hirsutism: Secondary | ICD-10-CM | POA: Diagnosis not present

## 2015-07-13 DIAGNOSIS — E349 Endocrine disorder, unspecified: Secondary | ICD-10-CM | POA: Diagnosis not present

## 2015-07-13 DIAGNOSIS — R7989 Other specified abnormal findings of blood chemistry: Secondary | ICD-10-CM

## 2015-07-13 MED ORDER — DOXYCYCLINE HYCLATE 100 MG PO CAPS: 100.0000 mg | ORAL_CAPSULE | Freq: Two times a day (BID) | ORAL | Status: DC

## 2015-07-13 NOTE — Telephone Encounter (Signed)
Appointment 07/20/15 @ 2:15pm

## 2015-07-13 NOTE — Patient Instructions (Signed)
Histerosalpingografa (Hysterosalpingography) La histerosalpingografa es un procedimiento para observar el interior del tero y las trompas de [place]. Durante este procedimiento, se inyecta una sustancia de contraste en el tero, a travs de la vagina y el cuello del tero para poder visualizar el tero mientras se toman imgenes radiogrficas. Este procedimiento puede ayudar al mdico a diagnosticar tumores, adherencias o anormalidades estructurales en el tero. Generalmente, se [place] para determinar las razones por las que una mujer no puede tener hijos (infertilidad). El procedimiento suele durar entre 15 y 66 minutos. INFORME A SU MDICO:  Cualquier alergia que tenga.  Todos los [employer], incluidos vitaminas, hierbas, gotas oftlmicas, cremas y medicamentos de venta libre.  Problemas previos que usted o los [employer] de su familia hayan tenido con el uso de anestsicos.  Enfermedades de [employer].  Cirugas previas.  Afecciones mdicas que tenga. RIESGOS Y COMPLICACIONES  En general, se trata de un procedimiento seguro. Sin embargo, Engineer, technical sales, pueden surgir problemas. Estos son algunos posibles problemas:  Infeccin en el revestimiento interior del tero (endometritis) o en las trompas de [place] (salpingitis).  Nicut trompas de [place].  Reaccin alrgica a la sustancia de [place] para tomar la radiografa. ANTES DEL PROCEDIMIENTO   Debe planificar el procedimiento para despus que finalice su perodo, pero antes de su prxima ovulacin. Esto ocurre por lo general [employer] 5 y 58 de su ltimo perodo. El Da 1 es Engineer, manufacturing systems de su perodo.  Consulte a su mdico si debe cambiar o suspender los medicamentos que toma habitualmente.  Podr comer y beber normalmente.  Antes de comenzar el procedimiento, vace la vejiga. PROCEDIMIENTO  Es posible que le administren un medicamento para Nurse, children's  (sedante) o un analgsico de venta libre para Public house manager las molestias durante el procedimiento.  Deber acostarse en una mesa de radiografas con los pies en los estribos.  Le colocarn en la vagina un dispositivo llamado espculo. Esto permite al mdico observar el interior de la vagina hasta el cuello del tero.  El cuello del tero se lava con un jabn especial.  Luego se pasa un tubo delgado y flexible a travs del cuello del tero hasta el tero.  Por el tubo se colocar una sustancia de [place].  Le tomarn varias radiografas a medida que el contraste pasa a travs del tero y las trompas de [place].  Despus del procedimiento se retira el tubo. DESPUS DEL PROCEDIMIENTO   La mayor parte de la sustancia de contraste se eliminar de la vagina naturalmente. Puede ser necesario que use un apsito sanitario.  Puede sentir clicos leves y notar una hemorragia vaginal leve. Luego de 24 horas deben desaparecer.  Pregunte cundo [employer]. Asegrese de [employer]. Document Released: 01/07/2012 Document Revised: 10/20/2013 [facility] Patient Information 2015 [place]. This information is not intended to replace advice given to you by your health care provider. Make sure you discuss any questions you have with your health care provider.

## 2015-07-13 NOTE — Progress Notes (Signed)
   Patient presented to the office today to discuss her ultrasound as a result of her chronic anovulation/amenorrhea/hirsutism/elevated testosterone level. Patient was last seen July 25 of this year with the following documented:  "Patient is a 39 year old Gravida 2 para 2 who was referred to our practice as a courtesy Dr. Brigitte Pulse as a result of patient's chronic vulvitis. Patient is morbidly obese Whereby she weighs 275 pounds and has a BMI of 50.32 kg her meter square. Patient was recently started on clobetasol for external pruritus by her PCP. It has helped to some extent. She denies any true discharge. She also states that she has been amenorrheic since February. She denies any nausea, vomiting, fever, chills, breast tenderness peer she denies any nipple discharge or any unusual headaches or visual disturbances. She states she is not using any contraception for the past 7 years. Patient also complaining of course chin hairs. She states that she is in the process of being scheduled for gastric bypass. On further question patient stated that many years ago in [place] as a result of a blood transfusion she acquired syphilis. Patient stated she not been sexually active for several years since her husband was in [place] and return back and she has had no other partner. In June 24 of this year her PCP had ordered a hepatitis panel which was all negative."  RPR done 07/05/2015 was nonreactive.Thyroid panel demonstrated TSH low at 0.080 with normal T4, T3 DHEAS low-normal 17 hydroxyprogesterone were normal Recent genital culture for HSV-2 positive GC and Chlamydia culture negative HIV nonreactive Prolactin normal  Patient was eventually started on Provera 10 mg to take 10 days of the month if she does not menstruate spontaneously every 30 days and this has helped and she has withdrawn. She was reminded office to do a urine pregnancy test before and if she is sexually active to make sure that she is not  pregnant. She is here to discuss the ultrasound today with the following noted:  Uterus measured 9.7 x 6.8 x 5.1 cm with endometrial stripe of 10 mm. A small intramural fibroid measuring 25 x 21 x 26 mm was noted right and left ovary increasing volume consistent with polycystic ovaries.  Assessment/plan:Morbidly obese patient with chronic anovulation and PCOS. Responding to Provera 10 mg for 10 days of the month if she does not menstruate spontaneously. Patient with secondary infertility will be scheduled for an HSG after the start of her next cycle. She will be prescribed Vibramycin 100 mg to take 1 by mouth twice a day starting the day before the procedure for 3 days for prophylaxis. She was reminded to stay on her prenatal vitamins. She is in the process of getting gastric banding.  Time of consultation 30 minutes

## 2015-07-20 ENCOUNTER — Ambulatory Visit: Payer: BLUE CROSS/BLUE SHIELD | Admitting: Endocrinology

## 2015-07-28 ENCOUNTER — Ambulatory Visit (INDEPENDENT_AMBULATORY_CARE_PROVIDER_SITE_OTHER): Payer: BLUE CROSS/BLUE SHIELD | Admitting: Endocrinology

## 2015-07-28 ENCOUNTER — Encounter: Payer: Self-pay | Admitting: Endocrinology

## 2015-07-28 VITALS — BP 127/82 | HR 76 | Temp 98.3°F | Ht 62.0 in | Wt 274.0 lb

## 2015-07-28 DIAGNOSIS — E0590 Thyrotoxicosis, unspecified without thyrotoxic crisis or storm: Secondary | ICD-10-CM

## 2015-07-28 NOTE — Patient Instructions (Addendum)
You have several options (please consider and let me know) One is surgery to remove the thyroid The other is to take a daily medication to slow the thyroid down One is the radioactive iodine: it works like this: We would first check a thyroid "scan" (a special, but easy and painless type of thyroid x ray).  you go to the x-ray department of the hospital to swallow a pill, which contains a miniscule amount of radiation.  You will not notice any symptoms from this.  You will go back to the x-ray department the next day, to lie down in front of a camera.  The results of this will be sent to me.   Based on the results, i hope to order for you a treatment pill of radioactive iodine.  Although it is a larger amount of radiation, you will again notice no symptoms from this.  The pill is gone from your body in a few days (during which you should stay away from other people), but takes several months to work.  Therefore, please return here approximately 6-8 weeks after the treatment.  This treatment has been available for many years, and the only known side-effect is an underactive thyroid.  It is possible that i would eventually prescribe for you a thyroid hormone pill, which is very inexpensive.  You don't have to worry about side-effects of this thyroid hormone pill, because it is the same molecule your thyroid makes.      Hipertiroidismo (Hyperthyroidism) La tiroides es una glndula grande ubicada en la parte anterior e inferior del cuello. La tiroides interviene [employer] control del metabolismo. El metabolismo es el modo en que el organismo utiliza los alimentos. El control del metabolismo se realiza a travs de una hormona denominada tiroxina. Cuando la tiroides es hiperactiva, produce demasiada hormona. Cuando esto ocurre pueden surgir los siguientes problemas:   Nerviosismo  Intolerancia al calor  Prdida de peso (a pesar del aumento de la ingesta de comida)  Diarrea  Cambios en la textura del  cabello o de la piel  Palpitaciones (falta de algunos latidos cardacos o latidos extra)  Taquicardia (frecuencia cardaca acelerada)  Falta de menstruacin (amenorrea)  Temblor en las manos CAUSAS  Enfermedad de [place] (el sistema inmune ataca a la glndula tiroides). sta es la causa ms frecuente.  Inflamacin de la glndula tiroides.  Tumor (generalmente benigno) de la glndula tiroides o Dispensing optician.  Uso excesivo de medicamentos para la tiroides (tanto prescriptos como 'naturales')  Ingesta excesiva de iodo. DIAGNSTICO Para confirmar el hipertiroidismo, el profesional que lo asiste le solicitar anlisis de sangre y estudios con [place]. Algunas veces los signos estn ocultos. Puede ser [employer] profesional controle la enfermedad con exmenes de [place], ya sea antes o despus del diagnstico y [place]. TRATAMIENTO Tratamiento a Estate agent varios tratamientos para Emergency planning/management officer. Los medicamentos beta bloqueantes podrn proporcionar cierto alivio. Los medicamentos que disminuyen la produccin de hormonas proporcionarn a Emergency planning/management officer temporal Estas medidas en general no ofrecen Environmental education officer. Tratamiento definitivo  Se dispone de varios tratamientos que el profesional que lo asiste podr Teacher, English as a foreign language con usted y que tratarn el problema de [place]. Estos tratamientos varan desde la ciruga (extirpacin de la tiroides) o el uso de yodo radiactivo (que destruye la tiroides por radiacin), hasta el uso de medicamentos antitiroideos (que interfieren en la sntesis de la hormona). Los dos primeros tratamientos son permanentes y Psychologist, clinical. Habitualmente requieren el suministro  de hormona de por vida. Esto es debido a que es imposible retirar o destruir la cantidad Engineer, petroleum de tiroides que se necesita para que la persona quede eutiroidea (normal). INSTRUCCIONES PARA EL CUIDADO DOMICILIARIO Consulte con el  profesional que lo asiste si el problema por el que lo trata empeora. Algunos ejemplos seran los trastornos ya mencionados. SOLICITE ATENCIN MDICA SI: El trastorno general empeora. EST SEGURO QUE:   Comprende las instrucciones para el alta mdica.  Controlar su enfermedad.  Solicitar atencin mdica de inmediato segn las indicaciones. Document Released: 10/15/2005 Document Revised: 01/07/2012 [facility] Patient Information 2015 [place]. This information is not intended to replace advice given to you by your health care provider. Make sure you discuss any questions you have with your health care provider.

## 2015-07-28 NOTE — Progress Notes (Signed)
Subjective:    Patient ID: [name], female    DOB: Jan 18, 1976, 39 y.o.   MRN: [id number]  HPI Pt was dx'ed with hyperthyroidism in ear;ly 2016 (TFT were normal in late 2015).  she has never been on therapy for this.  she has never had XRT to the anterior neck, or thyroid surgery.  she has never had thyroid imaging.  she does not consume kelp or any other prescribed or non-prescribed thyroid medication.  she has never been on amiodarone.  She has slight palpiations in the chest, and assoc myalgias. Past Medical History  Diagnosis Date  . H. pylori infection     dx  09-02-2014-- treated w/ antibiotic  . GERD (gastroesophageal reflux disease)   . Type 2 diabetes mellitus   . Carpal tunnel syndrome, bilateral   . PCOS (polycystic ovarian syndrome)   . Menorrhagia   . Irregular menstrual cycle   . Hypertension   . Depression     Past Surgical History  Procedure Laterality Date  . Cesarean section  1995  . Carpal tunnel release Bilateral 10/07/2014    Procedure: CARPAL TUNNEL RELEASE BILATERAL;  Surgeon: [name], MD;  Location: [facility];  Service: Orthopedics;  Laterality: Bilateral;    Social History   Social History  . Marital Status: Married    Spouse Name: N/A  . Number of Children: 2  . Years of Education: N/A   Occupational History  . Not on file.   Social History Main Topics  . Smoking status: Never Smoker   . Smokeless tobacco: Never Used  . Alcohol Use: 0.0 oz/week    0 Standard drinks or equivalent per week     Comment: socially  . Drug Use: No  . Sexual Activity:    Partners: Male    Birth Control/ Protection: None   Other Topics Concern  . Not on file   Social History Narrative   No reported caffeine use     Current Outpatient Prescriptions on File Prior to Visit  Medication Sig Dispense Refill  . canagliflozin (INVOKANA) 100 MG TABS tablet Take 1 tablet (100 mg total) by mouth daily. 30 tablet 2  . clobetasol cream  (TEMOVATE) 9.37 % Apply 1 application topically 2 (two) times daily. x10d 60 g 0  . doxycycline (VIBRAMYCIN) 100 MG capsule Take 1 capsule (100 mg total) by mouth 2 (two) times daily. Take one tablet twice a dy starting the day before procedure. 6 capsule 0  . EPINEPHrine (EPIPEN 2-PAK) 0.3 mg/0.3 mL IJ SOAJ injection Inject 0.3 mLs (0.3 mg total) into the muscle once. 1 Device 2  . FLUoxetine (PROZAC) 20 MG tablet Take 2 tablets (40 mg total) by mouth daily. 60 tablet 5  . fluticasone (FLONASE) 50 MCG/ACT nasal spray Place 2 sprays into both nostrils daily. 16 g 12  . glimepiride (AMARYL) 4 MG tablet Take 2 tabs (8 mg) po twice daily. 120 tablet 3  . hydrochlorothiazide (HYDRODIURIL) 25 MG tablet Take 1 tablet (25 mg total) by mouth daily. 90 tablet 3  . medroxyPROGESTERone (PROVERA) 10 MG tablet Take one trablet daily for 10 days of the month if no spontaneous menses every 30 days IF negative home UPT 30 tablet 4  . metFORMIN (GLUCOPHAGE) 500 MG tablet Take 2 tabs by mouth (1000 mg ) 2 times daily. 120 tablet 11  . metoprolol succinate (TOPROL-XL) 25 MG 24 hr tablet Take 1 tablet (25 mg total) by mouth daily. 90 tablet  0  . omeprazole (PRILOSEC) 20 MG capsule Take 1 capsule (20 mg total) by mouth daily. 90 capsule 3  . saxagliptin HCl (ONGLYZA) 5 MG TABS tablet Take 1 tablet (5 mg total) by mouth daily. 30 tablet 11  . valACYclovir (VALTREX) 1000 MG tablet Take 1 tablet (1,000 mg total) by mouth 2 (two) times daily. 14 tablet 2   No current facility-administered medications on file prior to visit.    Allergies  Allergen Reactions  . 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 Shrimp [Shellfish Allergy] Anaphylaxis    Family History  Problem Relation Age of Onset  . Heart disease Mother   . Diabetes Mother   . Hypertension Mother   . Cancer Father   . Thyroid disease Neg Hx     BP 127/82 mmHg  Pulse 76  Temp(Src) 98.3 F (36.8 C) (Oral)  Ht 5\' 2"  (1.575 m)  Wt 274 lb (124.286 kg)   BMI 50.10 kg/m2  SpO2 97%  LMP 07/01/2015  Review of Systems denies weight loss, headache, hoarseness, visual loss, chest pain, sob, diarrhea, polyuria, muscle weakness, excessive diaphoresis, tremor, easy bruising, and rhinorrhea.  She has low-back pain, heat intolerance, and anxiety.      Objective:   Physical Exam VS: see vs page GEN: no distress HEAD: head: no deformity eyes: no periorbital swelling, no proptosis external nose and ears are normal mouth: no lesion seen NECK: supple, thyroid is not enlarged CHEST WALL: no deformity LUNGS:  Clear to auscultation.  CV: reg rate and rhythm, no murmur ABD: abdomen is soft, nontender.  no hepatosplenomegaly.  not distended.  no hernia MUSCULOSKELETAL: muscle bulk and strength are grossly normal.  no obvious joint swelling.  gait is normal and steady EXTEMITIES: no deformity.  no ulcer on the feet.  feet are of normal color and temp.  no edema PULSES: dorsalis pedis intact bilat.  no carotid bruit NEURO:  cn 2-12 grossly intact.   readily moves all 4's.  sensation is intact to touch on the feet.  No tremor. SKIN:  Normal texture and temperature.  No rash or suspicious lesion is visible.  Not diaphoretic NODES:  None palpable at the neck PSYCH: alert, well-oriented.  Does not appear anxious nor depressed.   Lab Results  Component Value Date   TSH 0.080* 07/05/2015   T4TOTAL 8.2 07/05/2015   radiol: CXR (05/15/14): no mention is made of any goiter.  i personally reviewed electrocardiogram tracing (02/23/15): Indication: DM Impression: high voltage    Assessment & Plan:  Hyperthyroidism, new, prob due to Grave's dz, but multinodular goiter is also possible.      Patient is advised the following: Patient Instructions  You have several options (please consider and let me know) One is surgery to remove the thyroid The other is to take a daily medication to slow the thyroid down One is the radioactive iodine: it works like  this: We would first check a thyroid "scan" (a special, but easy and painless type of thyroid x ray).  you go to the x-ray department of the hospital to swallow a pill, which contains a miniscule amount of radiation.  You will not notice any symptoms from this.  You will go back to the x-ray department the next day, to lie down in front of a camera.  The results of this will be sent to me.   Based on the results, i hope to order for you a treatment pill of radioactive iodine.  Although it is  a larger amount of radiation, you will again notice no symptoms from this.  The pill is gone from your body in a few days (during which you should stay away from other people), but takes several months to work.  Therefore, please return here approximately 6-8 weeks after the treatment.  This treatment has been available for many years, and the only known side-effect is an underactive thyroid.  It is possible that i would eventually prescribe for you a thyroid hormone pill, which is very inexpensive.  You don't have to worry about side-effects of this thyroid hormone pill, because it is the same molecule your thyroid makes.      Hipertiroidismo (Hyperthyroidism) La tiroides es una glndula grande ubicada en la parte anterior e inferior del cuello. La tiroides interviene [employer] control del metabolismo. El metabolismo es el modo en que el organismo utiliza los alimentos. El control del metabolismo se realiza a travs de una hormona denominada tiroxina. Cuando la tiroides es hiperactiva, produce demasiada hormona. Cuando esto ocurre pueden surgir los siguientes problemas:   Nerviosismo  Intolerancia al calor  Prdida de peso (a pesar del aumento de la ingesta de comida)  Diarrea  Cambios en la textura del cabello o de la piel  Palpitaciones (falta de algunos latidos cardacos o latidos extra)  Taquicardia (frecuencia cardaca acelerada)  Falta de menstruacin (amenorrea)  Temblor en las  manos CAUSAS  Enfermedad de Graves (el sistema inmune ataca a la glndula tiroides). sta es la causa ms frecuente.  Inflamacin de la glndula tiroides.  Tumor (generalmente benigno) de la glndula tiroides o Dispensing optician.  Uso excesivo de medicamentos para la tiroides (tanto prescriptos como 'naturales')  Ingesta excesiva de iodo. DIAGNSTICO Para confirmar el hipertiroidismo, el profesional que lo asiste le solicitar anlisis de sangre y estudios con [place]. Algunas veces los signos estn ocultos. Puede ser [employer] profesional controle la enfermedad con exmenes de [place], ya sea antes o despus del diagnstico y [place]. TRATAMIENTO Tratamiento a Estate agent varios tratamientos para Emergency planning/management officer. Los medicamentos beta bloqueantes podrn proporcionar cierto alivio. Los medicamentos que disminuyen la produccin de hormonas proporcionarn a Emergency planning/management officer temporal Estas medidas en general no ofrecen Environmental education officer. Tratamiento definitivo  Se dispone de varios tratamientos que el profesional que lo asiste podr Teacher, English as a foreign language con usted y que tratarn el problema de [place]. Estos tratamientos varan desde la ciruga (extirpacin de la tiroides) o el uso de yodo radiactivo (que destruye la tiroides por radiacin), hasta el uso de medicamentos antitiroideos (que interfieren en la sntesis de la hormona). Los dos primeros tratamientos son permanentes y Psychologist, clinical. Habitualmente requieren el suministro de hormona de por vida. Esto es debido a que es imposible retirar o destruir la cantidad Engineer, petroleum de tiroides que se necesita para que la persona quede eutiroidea (normal). INSTRUCCIONES PARA EL CUIDADO DOMICILIARIO Consulte con el profesional que lo asiste si el problema por el que lo trata empeora. Algunos ejemplos seran los trastornos ya mencionados. SOLICITE ATENCIN MDICA SI: El trastorno general empeora. EST  SEGURO QUE:   Comprende las instrucciones para el alta mdica.  Controlar su enfermedad.  Solicitar atencin mdica de inmediato segn las indicaciones. Document Released: 10/15/2005 Document Revised: 01/07/2012 [facility] Patient Information 2015 [place]. This information is not intended to replace advice given to you by your health care provider. Make sure you discuss any questions you have with your health care provider.

## 2015-08-12 ENCOUNTER — Ambulatory Visit (INDEPENDENT_AMBULATORY_CARE_PROVIDER_SITE_OTHER): Payer: BLUE CROSS/BLUE SHIELD | Admitting: Family Medicine

## 2015-08-12 ENCOUNTER — Encounter: Payer: Self-pay | Admitting: Family Medicine

## 2015-08-12 VITALS — BP 120/77 | HR 66 | Temp 98.4°F | Resp 16 | Ht 62.0 in | Wt 271.4 lb

## 2015-08-12 DIAGNOSIS — A09 Infectious gastroenteritis and colitis, unspecified: Secondary | ICD-10-CM

## 2015-08-12 DIAGNOSIS — Z8619 Personal history of other infectious and parasitic diseases: Secondary | ICD-10-CM | POA: Diagnosis not present

## 2015-08-12 DIAGNOSIS — IMO0001 Reserved for inherently not codable concepts without codable children: Secondary | ICD-10-CM

## 2015-08-12 DIAGNOSIS — R350 Frequency of micturition: Secondary | ICD-10-CM

## 2015-08-12 DIAGNOSIS — E0590 Thyrotoxicosis, unspecified without thyrotoxic crisis or storm: Secondary | ICD-10-CM

## 2015-08-12 DIAGNOSIS — E1165 Type 2 diabetes mellitus with hyperglycemia: Secondary | ICD-10-CM | POA: Diagnosis not present

## 2015-08-12 DIAGNOSIS — I10 Essential (primary) hypertension: Secondary | ICD-10-CM | POA: Diagnosis not present

## 2015-08-12 DIAGNOSIS — E282 Polycystic ovarian syndrome: Secondary | ICD-10-CM

## 2015-08-12 LAB — COMPREHENSIVE METABOLIC PANEL
ALBUMIN: 3.9 g/dL (ref 3.6–5.1)
ALT: 25 U/L (ref 6–29)
AST: 17 U/L (ref 10–30)
Alkaline Phosphatase: 73 U/L (ref 33–115)
BILIRUBIN TOTAL: 0.4 mg/dL (ref 0.2–1.2)
BUN: 17 mg/dL (ref 7–25)
CO2: 28 mmol/L (ref 20–31)
CREATININE: 0.78 mg/dL (ref 0.50–1.10)
Calcium: 9.2 mg/dL (ref 8.6–10.2)
Chloride: 101 mmol/L (ref 98–110)
Glucose, Bld: 83 mg/dL (ref 65–99)
Potassium: 3.7 mmol/L (ref 3.5–5.3)
SODIUM: 138 mmol/L (ref 135–146)
TOTAL PROTEIN: 6.9 g/dL (ref 6.1–8.1)

## 2015-08-12 LAB — POCT URINALYSIS DIP (MANUAL ENTRY)
BILIRUBIN UA: NEGATIVE
Bilirubin, UA: NEGATIVE
Glucose, UA: >=1000 — AB
Nitrite, UA: NEGATIVE
PROTEIN UA: NEGATIVE
Spec Grav, UA: 1.02
Urobilinogen, UA: 0.2
pH, UA: 5.5

## 2015-08-12 LAB — CBC WITH DIFFERENTIAL/PLATELET
BASOS ABS: 0 10*3/uL (ref 0.0–0.1)
BASOS PCT: 0 % (ref 0–1)
EOS ABS: 0.2 10*3/uL (ref 0.0–0.7)
Eosinophils Relative: 2 % (ref 0–5)
HCT: 42.8 % (ref 36.0–46.0)
HEMOGLOBIN: 14.1 g/dL (ref 12.0–15.0)
Lymphocytes Relative: 32 % (ref 12–46)
Lymphs Abs: 3.6 10*3/uL (ref 0.7–4.0)
MCH: 28.4 pg (ref 26.0–34.0)
MCHC: 32.9 g/dL (ref 30.0–36.0)
MCV: 86.1 fL (ref 78.0–100.0)
MONOS PCT: 9 % (ref 3–12)
MPV: 9.9 fL (ref 8.6–12.4)
Monocytes Absolute: 1 10*3/uL (ref 0.1–1.0)
NEUTROS ABS: 6.4 10*3/uL (ref 1.7–7.7)
NEUTROS PCT: 57 % (ref 43–77)
PLATELETS: 381 10*3/uL (ref 150–400)
RBC: 4.97 MIL/uL (ref 3.87–5.11)
RDW: 13.7 % (ref 11.5–15.5)
WBC: 11.2 10*3/uL — ABNORMAL HIGH (ref 4.0–10.5)

## 2015-08-12 MED ORDER — LISINOPRIL 10 MG PO TABS: 10.0000 mg | ORAL_TABLET | Freq: Every day | ORAL | Status: DC

## 2015-08-12 MED ORDER — METOPROLOL SUCCINATE ER 25 MG PO TB24: 25.0000 mg | ORAL_TABLET | Freq: Every day | ORAL | Status: DC

## 2015-08-12 NOTE — Progress Notes (Signed)
[  Subjective:    Patient ID: [name], female    DOB: [birth year], 39 y.o.   MRN: [id number]  08/12/2015  Follow-up; Diarrhea; and Medication Refill   HPI This 39 y.o. female presents for six week follow-up:  1.  Graves disease: to undergo thyroid ablation this month.  S/p endocrinology consultation Renato Shin, MD.    2.  Obesity:  Still awaiting appointment for weight loss.    3.  PCOS: s/p ultrasound. Follow-up with Toney Rakes after next menses.  To take Provera to induce menses.  Repeat ultrasound?    4. Syphilis: repeat RPR negative.    5. HTN: decreased Lisinopril 20mg  1/2 daily at last visit.  6.  DMII: Patient reports good compliance with medication, good tolerance to medication, and good symptom control.  Around 9:00am-12:00pm, will get shaky.  Sugar is 56-80.  For breakfast, eating hot chocolate mocha small and does not finish, spicy sandwich egg, torizo, bread, cheese.  Having urinary frequency intermittently; sugar was 80.    7. Diarrhea: had diarrhea today and suffered with incontinence.  Needs note for work. No fever; +chills this morning.  Chronic sweating.  Mild nausea.  No vomiting.  No abdominal pain.    Review of Systems  Constitutional: Negative for fever, chills, diaphoresis and fatigue.  Eyes: Negative for visual disturbance.  Respiratory: Negative for cough and shortness of breath.   Cardiovascular: Negative for chest pain, palpitations and leg swelling.  Gastrointestinal: Positive for diarrhea. Negative for nausea, vomiting, abdominal pain and constipation.  Endocrine: Negative for cold intolerance, heat intolerance, polydipsia, polyphagia and polyuria.  Genitourinary: Positive for frequency and menstrual problem. Negative for dysuria, urgency, flank pain and decreased urine volume.  Neurological: Negative for dizziness, tremors, seizures, syncope, facial asymmetry, speech difficulty, weakness, light-headedness, numbness and headaches.    Past  Medical History  Diagnosis Date  . H. pylori infection     dx  09-02-2014-- treated w/ antibiotic  . GERD (gastroesophageal reflux disease)   . Type 2 diabetes mellitus ([place])   . Carpal tunnel syndrome, bilateral   . PCOS (polycystic ovarian syndrome)   . Menorrhagia   . Irregular menstrual cycle   . Hypertension   . Depression    Past Surgical History  Procedure Laterality Date  . Cesarean section  1995  . Carpal tunnel release Bilateral 10/07/2014    Procedure: CARPAL TUNNEL RELEASE BILATERAL;  Surgeon: [name], MD;  Location[facility];  Service: Orthopedics;  Laterality: Bilateral;   Allergies  Allergen Reactions  . 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 Shrimp [Shellfish Allergy] Anaphylaxis   Current Outpatient Prescriptions  Medication Sig Dispense Refill  . canagliflozin (INVOKANA) 100 MG TABS tablet Take 1 tablet (100 mg total) by mouth daily. 30 tablet 2  . EPINEPHrine (EPIPEN 2-PAK) 0.3 mg/0.3 mL IJ SOAJ injection Inject 0.3 mLs (0.3 mg total) into the muscle once. 1 Device 2  . FLUoxetine (PROZAC) 20 MG tablet Take 2 tablets (40 mg total) by mouth daily. 60 tablet 5  . fluticasone (FLONASE) 50 MCG/ACT nasal spray Place 2 sprays into both nostrils daily. 16 g 12  . glimepiride (AMARYL) 4 MG tablet Take 2 tabs (8 mg) po twice daily. 120 tablet 3  . hydrochlorothiazide (HYDRODIURIL) 25 MG tablet Take 1 tablet (25 mg total) by mouth daily. 90 tablet 3  . medroxyPROGESTERone (PROVERA) 10 MG tablet Take one trablet daily for 10 days of the month if no spontaneous menses every 30 days IF  negative home UPT 30 tablet 4  . metFORMIN (GLUCOPHAGE) 500 MG tablet Take 2 tabs by mouth (1000 mg ) 2 times daily. 120 tablet 11  . metoprolol succinate (TOPROL-XL) 25 MG 24 hr tablet Take 1 tablet (25 mg total) by mouth daily. 90 tablet 3  . omeprazole (PRILOSEC) 20 MG capsule Take 1 capsule (20 mg total) by mouth daily. 90 capsule 3  . saxagliptin HCl (ONGLYZA) 5 MG  TABS tablet Take 1 tablet (5 mg total) by mouth daily. 30 tablet 11  . valACYclovir (VALTREX) 1000 MG tablet Take 1 tablet (1,000 mg total) by mouth 2 (two) times daily. 14 tablet 2  . benzonatate (TESSALON) 100 MG capsule Take 1-2 capsules (100-200 mg total) by mouth 3 (three) times daily as needed for cough. 40 capsule 0  . clindamycin (CLINDAGEL) 1 % gel Apply topically 2 (two) times daily. 30 g 0  . clobetasol cream (TEMOVATE) 1.44 % Apply 1 application topically 2 (two) times daily. x10d 60 g 0  . Dextromethorphan-Guaifenesin (MUCINEX DM) 30-600 MG TB12 Take 1 tablet by mouth 2 (two) times daily as needed. 28 each 0  . lisinopril (PRINIVIL,ZESTRIL) 10 MG tablet Take 1 tablet (10 mg total) by mouth daily. 90 tablet 3  . meloxicam (MOBIC) 15 MG tablet Take 1 tablet (15 mg total) by mouth daily. 30 tablet 1  . methocarbamol (ROBAXIN) 500 MG tablet Take 1-2 tablets (500-1,000 mg total) by mouth 4 (four) times daily. 40 tablet 0   No current facility-administered medications for this visit.   Social History   Social History  . Marital Status: Married    Spouse Name: N/A  . Number of Children: 2  . Years of Education: N/A   Occupational History  . Not on file.   Social History Main Topics  . Smoking status: Never Smoker   . Smokeless tobacco: Never Used  . Alcohol Use: 0.0 oz/week    0 Standard drinks or equivalent per week     Comment: socially  . Drug Use: No  . Sexual Activity:    Partners: Male    Birth Control/ Protection: None   Other Topics Concern  . Not on file   Social History Narrative   No reported caffeine use    Family History  Problem Relation Age of Onset  . Heart disease Mother   . Diabetes Mother   . Hypertension Mother   . Cancer Father   . Thyroid disease Neg Hx        Objective:    BP 120/77 mmHg  Pulse 66  Temp(Src) 98.4 F (36.9 C) (Oral)  Resp 16  Ht 5\' 2"  (1.575 m)  Wt 271 lb 6.4 oz (123.106 kg)  BMI 49.63 kg/m2  LMP  07/01/2015 Physical Exam  Constitutional: She is oriented to person, place, and time. She appears well-developed and well-nourished. No distress.  Morbidly obese  HENT:  Head: Normocephalic and atraumatic.  Right Ear: External ear normal.  Left Ear: External ear normal.  Nose: Nose normal.  Mouth/Throat: Oropharynx is clear and moist.  Eyes: Conjunctivae and EOM are normal. Pupils are equal, round, and reactive to light.  Neck: Normal range of motion. Neck supple. Carotid bruit is not present. No thyromegaly present.  Cardiovascular: Normal rate, regular rhythm, normal heart sounds and intact distal pulses.  Exam reveals no gallop and no friction rub.   No murmur heard. Pulmonary/Chest: Effort normal and breath sounds normal. She has no wheezes. She has no rales.  Abdominal: Soft. Bowel sounds are normal. She exhibits no distension and no mass. There is no tenderness. There is no rebound and no guarding.  Lymphadenopathy:    She has no cervical adenopathy.  Neurological: She is alert and oriented to person, place, and time. No cranial nerve deficit.  Skin: Skin is warm and dry. No rash noted. She is not diaphoretic. No erythema. No pallor.  Psychiatric: She has a normal mood and affect. Her behavior is normal.        Assessment & Plan:   1. Urinary frequency   2. Essential hypertension, benign   3. Diarrhea of infectious origin   4. POLYCYSTIC OVARIAN DISEASE   5. Uncontrolled type 2 diabetes mellitus without complication, without long-term current use of insulin ([place])   6. Morbid obesity due to excess calories ([place])   7. History of syphilis   8. Hyperthyroidism    1. Urinary frequency: New. Send urine culture. 2.  HTN: controlled with decrease in Lisinopril to 10mg  daily.  Obtain labs; no change to management. 3.  Diarrhea: New. Missed work today; benign exam and now less frequent; note for work provided. 4.  PCOS: stable; managed by gynecology; to take Provera every three  months to induce menses. 5.  DMII: improved control with improved compliance with medications and with weight loss and dietary modification. 6.  History of syphilis: s/p treatment; managed by gynecology. 7.  Morbid obesity; continues to work on weight loss, exercise, dietary modification; advised to focus on smaller portions. 8. Hyperthyroidism: New.  To undergo ablation.   Orders Placed This Encounter  Procedures  . Urine culture  . CBC with Differential/Platelet  . Comprehensive metabolic panel  . POCT urinalysis dipstick   Meds ordered this encounter  Medications  . lisinopril (PRINIVIL,ZESTRIL) 10 MG tablet    Sig: Take 1 tablet (10 mg total) by mouth daily.    Dispense:  90 tablet    Refill:  3  . metoprolol succinate (TOPROL-XL) 25 MG 24 hr tablet    Sig: Take 1 tablet (25 mg total) by mouth daily.    Dispense:  90 tablet    Refill:  3    Return in about 4 days (around 08/16/2015).     Lonie Newsham Elayne Guerin, M.D. [facility] [address] [place], [place]  [phone] phone [phone] fax

## 2015-08-14 LAB — URINE CULTURE
COLONY COUNT: NO GROWTH
Organism ID, Bacteria: NO GROWTH

## 2015-08-15 ENCOUNTER — Ambulatory Visit: Payer: Self-pay | Admitting: Family Medicine

## 2015-08-19 ENCOUNTER — Telehealth: Payer: Self-pay | Admitting: Endocrinology

## 2015-08-19 NOTE — Telephone Encounter (Signed)
You don't need to be isolated for this.  You only need to go for a 15-minute appt on the 27th, and a 1-hr appt on the 28th. When you get the treatment pill, you'll need to be isolated

## 2015-08-19 NOTE — Telephone Encounter (Signed)
Pt needs letter with signature to be out for the procedure on the 27 and 28th and that she is able to return to work on 31st with no restrictions. Call pt when its done.

## 2015-08-23 NOTE — Telephone Encounter (Signed)
I contacted the pt and left a voicemail advising she does not need a letter for the up taking scan she is having done on the 27th and 28th of October. Patient advised to call back if she has any questions.

## 2015-08-25 ENCOUNTER — Encounter (HOSPITAL_COMMUNITY)
Admission: RE | Admit: 2015-08-25 | Discharge: 2015-08-25 | Disposition: A | Discharge status: Home / Self Care | Payer: BLUE CROSS/BLUE SHIELD | Source: Ambulatory Visit | Attending: Endocrinology | Admitting: Endocrinology

## 2015-08-25 DIAGNOSIS — E0590 Thyrotoxicosis, unspecified without thyrotoxic crisis or storm: Secondary | ICD-10-CM | POA: Insufficient documentation

## 2015-08-25 MED ORDER — SODIUM IODIDE I 131 CAPSULE: 10.5000 | Freq: Once | INTRAVENOUS | Status: DC | PRN

## 2015-08-26 ENCOUNTER — Ambulatory Visit (HOSPITAL_COMMUNITY)
Admission: RE | Admit: 2015-08-26 | Discharge: 2015-08-26 | Disposition: A | Discharge status: Home / Self Care | Payer: BLUE CROSS/BLUE SHIELD | Source: Ambulatory Visit | Attending: Endocrinology | Admitting: Endocrinology

## 2015-08-26 DIAGNOSIS — E0590 Thyrotoxicosis, unspecified without thyrotoxic crisis or storm: Secondary | ICD-10-CM | POA: Insufficient documentation

## 2015-08-26 MED ORDER — SODIUM PERTECHNETATE TC 99M INJECTION
10.0000 | Freq: Once | INTRAVENOUS | Status: AC | PRN
  Administered 2015-08-26: 10 via INTRAVENOUS

## 2015-08-28 ENCOUNTER — Other Ambulatory Visit: Payer: Self-pay | Admitting: Endocrinology

## 2015-08-28 DIAGNOSIS — E0590 Thyrotoxicosis, unspecified without thyrotoxic crisis or storm: Secondary | ICD-10-CM

## 2015-09-07 ENCOUNTER — Encounter: Payer: Self-pay | Admitting: Family Medicine

## 2015-09-14 ENCOUNTER — Ambulatory Visit (INDEPENDENT_AMBULATORY_CARE_PROVIDER_SITE_OTHER): Payer: BLUE CROSS/BLUE SHIELD | Admitting: Family Medicine

## 2015-09-14 VITALS — BP 100/60 | HR 82 | Temp 98.9°F | Resp 16 | Ht 62.0 in | Wt 275.0 lb

## 2015-09-14 DIAGNOSIS — N760 Acute vaginitis: Secondary | ICD-10-CM | POA: Diagnosis not present

## 2015-09-14 DIAGNOSIS — E1165 Type 2 diabetes mellitus with hyperglycemia: Secondary | ICD-10-CM | POA: Diagnosis not present

## 2015-09-14 DIAGNOSIS — M25562 Pain in left knee: Secondary | ICD-10-CM

## 2015-09-14 DIAGNOSIS — L710 Perioral dermatitis: Secondary | ICD-10-CM | POA: Diagnosis not present

## 2015-09-14 DIAGNOSIS — M545 Low back pain, unspecified: Secondary | ICD-10-CM

## 2015-09-14 DIAGNOSIS — I10 Essential (primary) hypertension: Secondary | ICD-10-CM | POA: Diagnosis not present

## 2015-09-14 DIAGNOSIS — E669 Obesity, unspecified: Secondary | ICD-10-CM

## 2015-09-14 DIAGNOSIS — J0101 Acute recurrent maxillary sinusitis: Secondary | ICD-10-CM | POA: Diagnosis not present

## 2015-09-14 DIAGNOSIS — IMO0001 Reserved for inherently not codable concepts without codable children: Secondary | ICD-10-CM

## 2015-09-14 LAB — POCT GLYCOSYLATED HEMOGLOBIN (HGB A1C): HEMOGLOBIN A1C: 6.7

## 2015-09-14 LAB — GLUCOSE, POCT (MANUAL RESULT ENTRY): POC Glucose: 84 mg/dl (ref 70–99)

## 2015-09-14 MED ORDER — DOXYCYCLINE HYCLATE 100 MG PO CAPS: 100.0000 mg | ORAL_CAPSULE | Freq: Two times a day (BID) | ORAL | Status: DC

## 2015-09-14 MED ORDER — MELOXICAM 15 MG PO TABS: 15.0000 mg | ORAL_TABLET | Freq: Every day | ORAL | Status: DC

## 2015-09-14 MED ORDER — CLINDAMYCIN PHOSPHATE 1 % EX GEL: Freq: Two times a day (BID) | CUTANEOUS | Status: DC

## 2015-09-14 MED ORDER — CLOBETASOL PROPIONATE 0.05 % EX CREA: 1.0000 "application " | TOPICAL_CREAM | Freq: Two times a day (BID) | CUTANEOUS | Status: DC

## 2015-09-14 NOTE — Progress Notes (Signed)
Subjective:    Patient ID: [name], female    DOB: [birth year], 39 y.o.   MRN: [id number]  09/14/2015  Rash and Gait Problem   HPI This 39 y.o. female presents for evaluation of L knee pain; L knee is painful when turning and walking.  Referred to ortho in 04/2014; s/p MRI knee L last year.  Referred last year in June 2015.  Same pain; intermittent.  Yesterday with acute pain with rotation; then no pain.  No swelling.  No giving out.  Returned to ortho for steroid injection but pain resolved; now recurrent pain in past month.  Last xray at [facility] 04/2014.    Vaginal itching: s/p gynecology cevaluation. Prescribed nystatin cream and clobetasol by Dr. Brigitte Pulse at recommendations of gynecology; requesting refill of Clobetasol which works well.   Facial rash:  Onset two week ago; located R chin and perioral region. No itching.  No pain or itching.  +pustules like pimples.  HTN: Patient reports good compliance with medication, good tolerance to medication, and good symptom control.  Denies dizziness.  Hyperthyroidism:  Awaiting treatment. Appointment not scheduled yet for procedure.  Obesity: presented to bariatric clinic.  Needs referral; pt advised that PCP had placed referral x 2 in the past.  Frustrated by lack of weight loss in the past month. B: sandwich with beans, milk Snack: 5 strawberries, 4 cups mango, mix Lunch:  Rice, beans, meat, no tortillas, two bottles water Snack:  Grapes green, cookie 1/3 Supper:    DMII: checking sugars.  Running good most days.  Patient reports good compliance with medication, good tolerance to medication, and good symptom control.    Sinus congestion: suffering with several weeks of sinus congestion. Denies fever/chills/sweats. No ear pain or sore throat.  Yellow thick nasal congestion.  +rhinorrhea; no cough. Not taking Claritin or Flonase at this time; only uses PRN.  Lower back pain: realizes due to obesity.  Stretching every day at work; job  physical and stands all day; no radiation into legs; no n/t/w.  No saddle paresthesias. NO b/b dysfunction.   Review of Systems  Constitutional: Negative for fever, chills, diaphoresis and fatigue.  Eyes: Negative for visual disturbance.  Respiratory: Negative for cough and shortness of breath.   Cardiovascular: Negative for chest pain, palpitations and leg swelling.  Gastrointestinal: Negative for nausea, vomiting, abdominal pain, diarrhea and constipation.  Endocrine: Negative for cold intolerance, heat intolerance, polydipsia, polyphagia and polyuria.  Genitourinary: Negative for vaginal bleeding, vaginal discharge and vaginal pain.  Musculoskeletal: Positive for myalgias, back pain and arthralgias. Negative for joint swelling, gait problem, neck pain and neck stiffness.  Skin: Positive for rash. Negative for color change.  Neurological: Negative for dizziness, tremors, seizures, syncope, facial asymmetry, speech difficulty, weakness, light-headedness, numbness and headaches.    Past Medical History  Diagnosis Date  . H. pylori infection     dx  09-02-2014-- treated w/ antibiotic  . GERD (gastroesophageal reflux disease)   . Type 2 diabetes mellitus ([place])   . Carpal tunnel syndrome, bilateral   . PCOS (polycystic ovarian syndrome)   . Menorrhagia   . Irregular menstrual cycle   . Hypertension   . Depression    Past Surgical History  Procedure Laterality Date  . Cesarean section  1995  . Carpal tunnel release Bilateral 10/07/2014    Procedure: CARPAL TUNNEL RELEASE BILATERAL;  Surgeon: [name], MD;  Location: [facility];  Service: Orthopedics;  Laterality: Bilateral;   Allergies  Allergen Reactions  . 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 Shrimp [Shellfish Allergy] Anaphylaxis   Current Outpatient Prescriptions  Medication Sig Dispense Refill  . canagliflozin (INVOKANA) 100 MG TABS tablet Take 1 tablet (100 mg total) by mouth daily. 30 tablet 2  .  clobetasol cream (TEMOVATE) AB-123456789 % Apply 1 application topically 2 (two) times daily. x10d 60 g 0  . doxycycline (VIBRAMYCIN) 100 MG capsule Take 1 capsule (100 mg total) by mouth 2 (two) times daily. 20 capsule 0  . EPINEPHrine (EPIPEN 2-PAK) 0.3 mg/0.3 mL IJ SOAJ injection Inject 0.3 mLs (0.3 mg total) into the muscle once. 1 Device 2  . FLUoxetine (PROZAC) 20 MG tablet Take 2 tablets (40 mg total) by mouth daily. 60 tablet 5  . fluticasone (FLONASE) 50 MCG/ACT nasal spray Place 2 sprays into both nostrils daily. 16 g 12  . glimepiride (AMARYL) 4 MG tablet Take 2 tabs (8 mg) po twice daily. 120 tablet 3  . hydrochlorothiazide (HYDRODIURIL) 25 MG tablet Take 1 tablet (25 mg total) by mouth daily. 90 tablet 3  . lisinopril (PRINIVIL,ZESTRIL) 10 MG tablet Take 1 tablet (10 mg total) by mouth daily. 90 tablet 3  . medroxyPROGESTERone (PROVERA) 10 MG tablet Take one trablet daily for 10 days of the month if no spontaneous menses every 30 days IF negative home UPT 30 tablet 4  . metFORMIN (GLUCOPHAGE) 500 MG tablet Take 2 tabs by mouth (1000 mg ) 2 times daily. 120 tablet 11  . metoprolol succinate (TOPROL-XL) 25 MG 24 hr tablet Take 1 tablet (25 mg total) by mouth daily. 90 tablet 3  . omeprazole (PRILOSEC) 20 MG capsule Take 1 capsule (20 mg total) by mouth daily. 90 capsule 3  . saxagliptin HCl (ONGLYZA) 5 MG TABS tablet Take 1 tablet (5 mg total) by mouth daily. 30 tablet 11  . clindamycin (CLINDAGEL) 1 % gel Apply topically 2 (two) times daily. 30 g 0  . meloxicam (MOBIC) 15 MG tablet Take 1 tablet (15 mg total) by mouth daily. 30 tablet 1  . valACYclovir (VALTREX) 1000 MG tablet Take 1 tablet (1,000 mg total) by mouth 2 (two) times daily. (Patient not taking: Reported on 09/14/2015) 14 tablet 2   No current facility-administered medications for this visit.   Social History   Social History  . Marital Status: Married    Spouse Name: N/A  . Number of Children: 2  . Years of Education: N/A    Occupational History  . Not on file.   Social History Main Topics  . Smoking status: Never Smoker   . Smokeless tobacco: Never Used  . Alcohol Use: 0.0 oz/week    0 Standard drinks or equivalent per week     Comment: socially  . Drug Use: No  . Sexual Activity:    Partners: Male    Birth Control/ Protection: None   Other Topics Concern  . Not on file   Social History Narrative   No reported caffeine use    Family History  Problem Relation Age of Onset  . Heart disease Mother   . Diabetes Mother   . Hypertension Mother   . Cancer Father   . Thyroid disease Neg Hx        Objective:    BP 100/60 mmHg  Pulse 82  Temp(Src) 98.9 F (37.2 C) (Oral)  Resp 16  Ht 5\' 2"  (1.575 m)  Wt 275 lb (124.739 kg)  BMI 50.29 kg/m2  SpO2 98%  LMP 09/02/2015  Physical Exam  Constitutional: She is oriented to person, place, and time. She appears well-developed and well-nourished. No distress.  Morbidly obese  HENT:  Head: Normocephalic and atraumatic.  Right Ear: External ear normal.  Left Ear: External ear normal.  Nose: Nose normal.  Mouth/Throat: Oropharynx is clear and moist.  Eyes: Conjunctivae and EOM are normal. Pupils are equal, round, and reactive to light.  Neck: Normal range of motion. Neck supple. Carotid bruit is not present. No thyromegaly present.  Cardiovascular: Normal rate, regular rhythm, normal heart sounds and intact distal pulses.  Exam reveals no gallop and no friction rub.   No murmur heard. Pulmonary/Chest: Effort normal and breath sounds normal. She has no wheezes. She has no rales.  Abdominal: Soft. Bowel sounds are normal. She exhibits no distension and no mass. There is no tenderness. There is no rebound and no guarding.  Musculoskeletal:       Left knee: Normal. She exhibits normal range of motion and no swelling. No tenderness found. No medial joint line, no lateral joint line, no MCL, no LCL and no patellar tendon tenderness noted.       Lumbar  back: Normal. She exhibits normal range of motion, no tenderness, no bony tenderness, no swelling and no pain.  Lumbar spine:  Non-tender midline; non-tender paraspinal regions B.  Straight leg raises negative B; toe and heel walking intact; marching intact; motor 5/5 BLE.  Full ROM lumbar spine without limitation.   Lymphadenopathy:    She has no cervical adenopathy.  Neurological: She is alert and oriented to person, place, and time. No cranial nerve deficit.  Skin: Skin is warm and dry. No rash noted. She is not diaphoretic. No erythema. No pallor.  Facial hair present along chin.   Pustules clustered inferior to R vermilion border. NO vesicles; no induration.  Scattered pustules/comedones along chin and entire facial region.  Psychiatric: She has a normal mood and affect. Her behavior is normal.   Results for orders placed or performed in visit on 09/14/15  POCT glucose (manual entry)  Result Value Ref Range   POC Glucose 84 70 - 99 mg/dl  POCT glycosylated hemoglobin (Hb A1C)  Result Value Ref Range   Hemoglobin A1C 6.7        Assessment & Plan:   1. Uncontrolled type 2 diabetes mellitus without complication, without long-term current use of insulin (HCC)   2. Obesity   3. Left knee pain   4. Bilateral low back pain without sciatica   5. Essential hypertension   6. Perioral dermatitis     1. DMII: much improved with improved compliance with medications; no changes to management at this time.  Obtain labs. 2.  Obesity: REFERRAL MADE 09/14/14, 04/12/15, AND 05/27/15 TO BARIATRIC CLINIC.  REFERRAL PLACED AGAIN. 3.  L knee pain: recurrent; s/p ortho consultation for similar/same pain in 2015; benign exam today; rx for Mobic provided to use for two weeks; continue with rest, icing, Meloxicam.  If worsens, RTC for imaging. 4.  B lower back pain without sciatica: chronic with recurrence; rx for Meloxicam provided; continue home exercise program; avoid heavy lifting; recommend weight  loss. 5.  HTN: controlled; no changes to management; obtain labs; denies dizziness. 6.  Perioral dermatitis: New; versus PCOS acne vulgaris.  rx for Doxycycline provided to treat dermatitis/acne and early sinusitis. Clindamycin gel also provided. 7. Acute maxillary sinusitis: New/early; rx for Doxycycline provided; recommend restarting Flonase.   Orders Placed This Encounter  Procedures  . CBC  with Differential/Platelet  . Comprehensive metabolic panel  . Ambulatory referral to General Surgery    Referral Priority:  Routine    Referral Type:  Surgical    Referral Reason:  Specialty Services Required    Requested Specialty:  General Surgery    Number of Visits Requested:  1  . POCT glucose (manual entry)  . POCT glycosylated hemoglobin (Hb A1C)   Meds ordered this encounter  Medications  . meloxicam (MOBIC) 15 MG tablet    Sig: Take 1 tablet (15 mg total) by mouth daily.    Dispense:  30 tablet    Refill:  1  . clobetasol cream (TEMOVATE) 0.05 %    Sig: Apply 1 application topically 2 (two) times daily. x10d    Dispense:  60 g    Refill:  0  . clindamycin (CLINDAGEL) 1 % gel    Sig: Apply topically 2 (two) times daily.    Dispense:  30 g    Refill:  0  . doxycycline (VIBRAMYCIN) 100 MG capsule    Sig: Take 1 capsule (100 mg total) by mouth 2 (two) times daily.    Dispense:  20 capsule    Refill:  0    No Follow-up on file.    [place] Elayne Guerin, M.D. [facility] [address] [place], [place]  [address] [phone] phone [phone] fax

## 2015-09-14 NOTE — Patient Instructions (Signed)
1.  TAKE MELOXICAM 15MG  --- ONE TABLET DAILY FOR KNEE PAIN AND LOW BACK PAIN. 2.  TAKE DOXYCYCLINE 100MG  --- ONE TABLET TWICE DAILY FOR SINUS CONGESTION AND RASH ON FACE. 3.  RESTART FLONASE NASAL SPRAY.

## 2015-09-15 LAB — COMPREHENSIVE METABOLIC PANEL
ALK PHOS: 71 U/L (ref 33–115)
ALT: 18 U/L (ref 6–29)
AST: 15 U/L (ref 10–30)
Albumin: 3.8 g/dL (ref 3.6–5.1)
BILIRUBIN TOTAL: 0.3 mg/dL (ref 0.2–1.2)
BUN: 16 mg/dL (ref 7–25)
CALCIUM: 9.2 mg/dL (ref 8.6–10.2)
CO2: 30 mmol/L (ref 20–31)
CREATININE: 0.71 mg/dL (ref 0.50–1.10)
Chloride: 100 mmol/L (ref 98–110)
GLUCOSE: 87 mg/dL (ref 65–99)
POTASSIUM: 3.9 mmol/L (ref 3.5–5.3)
Sodium: 138 mmol/L (ref 135–146)
Total Protein: 6.8 g/dL (ref 6.1–8.1)

## 2015-09-15 LAB — CBC WITH DIFFERENTIAL/PLATELET
Basophils Absolute: 0.1 10*3/uL (ref 0.0–0.1)
Basophils Relative: 1 % (ref 0–1)
EOS ABS: 0.2 10*3/uL (ref 0.0–0.7)
EOS PCT: 2 % (ref 0–5)
HEMATOCRIT: 40.6 % (ref 36.0–46.0)
Hemoglobin: 13.3 g/dL (ref 12.0–15.0)
LYMPHS PCT: 37 % (ref 12–46)
Lymphs Abs: 4 10*3/uL (ref 0.7–4.0)
MCH: 28.3 pg (ref 26.0–34.0)
MCHC: 32.8 g/dL (ref 30.0–36.0)
MCV: 86.4 fL (ref 78.0–100.0)
MONO ABS: 0.9 10*3/uL (ref 0.1–1.0)
MPV: 9.5 fL (ref 8.6–12.4)
Monocytes Relative: 8 % (ref 3–12)
Neutro Abs: 5.7 10*3/uL (ref 1.7–7.7)
Neutrophils Relative %: 52 % (ref 43–77)
Platelets: 407 10*3/uL — ABNORMAL HIGH (ref 150–400)
RBC: 4.7 MIL/uL (ref 3.87–5.11)
RDW: 13.1 % (ref 11.5–15.5)
WBC: 10.9 10*3/uL — AB (ref 4.0–10.5)

## 2015-09-16 ENCOUNTER — Telehealth: Payer: Self-pay | Admitting: Endocrinology

## 2015-09-18 ENCOUNTER — Ambulatory Visit (INDEPENDENT_AMBULATORY_CARE_PROVIDER_SITE_OTHER): Payer: BLUE CROSS/BLUE SHIELD

## 2015-09-18 ENCOUNTER — Ambulatory Visit (INDEPENDENT_AMBULATORY_CARE_PROVIDER_SITE_OTHER): Payer: BLUE CROSS/BLUE SHIELD | Admitting: Family Medicine

## 2015-09-18 VITALS — BP 120/84 | HR 74 | Temp 98.4°F | Resp 18 | Ht 62.5 in | Wt 272.2 lb

## 2015-09-18 DIAGNOSIS — S161XXA Strain of muscle, fascia and tendon at neck level, initial encounter: Secondary | ICD-10-CM

## 2015-09-18 DIAGNOSIS — S0993XA Unspecified injury of face, initial encounter: Secondary | ICD-10-CM

## 2015-09-18 DIAGNOSIS — F430 Acute stress reaction: Secondary | ICD-10-CM | POA: Diagnosis not present

## 2015-09-18 DIAGNOSIS — R519 Headache, unspecified: Secondary | ICD-10-CM

## 2015-09-18 DIAGNOSIS — R51 Headache: Secondary | ICD-10-CM

## 2015-09-18 MED ORDER — METHOCARBAMOL 500 MG PO TABS: 500.0000 mg | ORAL_TABLET | Freq: Four times a day (QID) | ORAL | Status: DC
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
## 2015-09-18 NOTE — Patient Instructions (Signed)
1.  TAKE MELOXICAM EVERY MORNING. 2.  TAKE METHOCARBAMOL ONE TABLET EVERY SIX HOURS FOR NEXT PAIN/SPASM. 3.  APPLY HEAT TO NECK. 4.  APPLY ICE TO FACE.

## 2015-09-19 ENCOUNTER — Telehealth: Payer: Self-pay

## 2015-09-19 NOTE — Telephone Encounter (Signed)
Noted  
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
## 2015-09-23 ENCOUNTER — Ambulatory Visit (INDEPENDENT_AMBULATORY_CARE_PROVIDER_SITE_OTHER): Payer: BLUE CROSS/BLUE SHIELD | Admitting: Family Medicine

## 2015-09-23 ENCOUNTER — Encounter: Payer: Self-pay | Admitting: Family Medicine

## 2015-09-23 VITALS — BP 139/70 | HR 83 | Temp 97.9°F | Resp 16 | Wt 272.2 lb

## 2015-09-23 DIAGNOSIS — J069 Acute upper respiratory infection, unspecified: Secondary | ICD-10-CM | POA: Diagnosis not present

## 2015-09-23 DIAGNOSIS — R220 Localized swelling, mass and lump, head: Secondary | ICD-10-CM | POA: Diagnosis not present

## 2015-09-23 DIAGNOSIS — E0590 Thyrotoxicosis, unspecified without thyrotoxic crisis or storm: Secondary | ICD-10-CM

## 2015-09-23 LAB — CBC WITH DIFFERENTIAL/PLATELET
BASOS PCT: 1 % (ref 0–1)
Basophils Absolute: 0.1 10*3/uL (ref 0.0–0.1)
Eosinophils Absolute: 0.2 10*3/uL (ref 0.0–0.7)
Eosinophils Relative: 2 % (ref 0–5)
HEMATOCRIT: 42.7 % (ref 36.0–46.0)
HEMOGLOBIN: 14.1 g/dL (ref 12.0–15.0)
LYMPHS ABS: 3.4 10*3/uL (ref 0.7–4.0)
LYMPHS PCT: 37 % (ref 12–46)
MCH: 28.7 pg (ref 26.0–34.0)
MCHC: 33 g/dL (ref 30.0–36.0)
MCV: 87 fL (ref 78.0–100.0)
MONO ABS: 0.8 10*3/uL (ref 0.1–1.0)
MONOS PCT: 9 % (ref 3–12)
MPV: 9.3 fL (ref 8.6–12.4)
NEUTROS ABS: 4.7 10*3/uL (ref 1.7–7.7)
NEUTROS PCT: 51 % (ref 43–77)
Platelets: 374 10*3/uL (ref 150–400)
RBC: 4.91 MIL/uL (ref 3.87–5.11)
RDW: 13 % (ref 11.5–15.5)
WBC: 9.2 10*3/uL (ref 4.0–10.5)

## 2015-09-23 LAB — POCT INFLUENZA A/B
INFLUENZA A, POC: NEGATIVE
Influenza B, POC: NEGATIVE

## 2015-09-23 MED ORDER — MUCINEX DM 30-600 MG PO TB12: 1.0000 | ORAL_TABLET | Freq: Two times a day (BID) | ORAL | Status: DC | PRN

## 2015-09-23 MED ORDER — BENZONATATE 100 MG PO CAPS: 100.0000 mg | ORAL_CAPSULE | Freq: Three times a day (TID) | ORAL | Status: DC | PRN

## 2015-09-23 NOTE — Patient Instructions (Signed)
Infección del tracto respiratorio superior, adultos  (Upper Respiratory Infection, Adult)  La mayoría de las infecciones del tracto respiratorio superior son infecciones virales de las vías que llevan el aire a los pulmones. Un infección del tracto respiratorio superior afecta la nariz, la garganta y las vías respiratorias superiores. El tipo más frecuente de infección del tracto respiratorio superior es la nasofaringitis, que habitualmente se conoce como "resfrío común".  Las infecciones del tracto respiratorio superior siguen su curso y por lo general se curan solas. En la mayoría de los casos, la infección del tracto respiratorio superior no requiere atención médica, pero a veces, después de una infección viral, puede surgir una infección bacteriana en las vías respiratorias superiores. Esto se conoce como infección secundaria. Las infecciones sinusales y en el oído medio son tipos frecuentes de infecciones secundarias en el tracto respiratorio superior.  La neumonía bacteriana también puede complicar un cuadro de infección del tracto respiratorio superior. Este tipo de infección puede empeorar el asma y la enfermedad pulmonar obstructiva crónica (EPOC). En algunos casos, estas complicaciones pueden requerir atención médica de emergencia y poner en peligro la vida.   CAUSAS  Casi todas las infecciones del tracto respiratorio superior se deben a los virus. Un virus es un tipo de microbio que puede contagiarse de una persona a otra.   FACTORES DE RIESGO  Puede estar en riesgo de sufrir una infección del tracto respiratorio superior si:   · Fuma.  · Tiene una enfermedad pulmonar o cardíaca crónica.  · Tiene debilitado el sistema de defensa (inmunitario) del cuerpo.  · Es muy joven o de edad muy avanzada.  · Tiene asma o alergias nasales.  · Trabaja en áreas donde hay mucha gente o poca ventilación.  · Trabaja en una escuela o en un centro de atención médica.  SIGNOS Y SÍNTOMAS   Habitualmente, los síntomas aparecen  de 2 a 3 días después de entrar en contacto con el virus del resfrío. La mayoría de las infecciones virales en el tracto respiratorio superior duran de 7 a 10 días. Sin embargo, las infecciones virales en el tracto respiratorio superior a causa del virus de la gripe pueden durar de 14 a 18 días y, habitualmente, son más graves. Entre los síntomas se pueden incluir los siguientes:   · Secreción o congestión nasal.  · Estornudos.  · Tos.  · Dolor de garganta.  · Dolor de cabeza.  · Fatiga.  · Fiebre.  · Pérdida del apetito.  · Dolor en la frente, detrás de los ojos y por encima de los pómulos (dolor sinusal).  · Dolores musculares.  DIAGNÓSTICO   El médico puede diagnosticar una infección del tracto respiratorio superior mediante los siguientes estudios:  · Examen físico.  · Pruebas para verificar si los síntomas no se deben a otra afección, por ejemplo:    Faringitis estreptocócica.    Sinusitis.    Neumonía.    Asma.  TRATAMIENTO   Esta infección desaparece sola, con el tiempo. No puede curarse con medicamentos, pero a menudo se prescriben para aliviar los síntomas. Los medicamentos pueden ser útiles para lo siguiente:  · Bajar la fiebre.  · Reducir la tos.  · Aliviar la congestión nasal.  INSTRUCCIONES PARA EL CUIDADO EN EL HOGAR   · Tome los medicamentos solamente como se lo haya indicado el médico.  · A fin de aliviar el dolor de garganta, haga gárgaras con solución salina templada o consuma caramelos para la tos, como se lo haya indicado el médico.  · Use un humidificador   de vapor cálido o inhale el vapor de la ducha para aumentar la humedad del aire. Esto facilitará la respiración.  · Beba suficiente líquido para mantener la orina clara o de color amarillo pálido.  · Consuma sopas y otros caldos transparentes, y aliméntese bien.  · Descanse todo lo que sea necesario.  · Regrese al trabajo cuando la temperatura se le haya normalizado o cuando el médico lo autorice. Es posible que deba quedarse en su casa durante  un tiempo prolongado, para no infectar a los demás. También puede usar un barbijo y lavarse las manos con cuidado para evitar la propagación del virus.  · Aumente el uso del inhalador si tiene asma.  · No consuma ningún producto que contenga tabaco, lo que incluye cigarrillos, tabaco de mascar o cigarrillos electrónicos. Si necesita ayuda para dejar de fumar, consulte al médico.  PREVENCIÓN   La mejor manera de protegerse de un resfrío es mantener una higiene adecuada.   · Evite el contacto oral o físico con personas que tengan síntomas de resfrío.  · En caso de contacto, lávese las manos con frecuencia.  No hay pruebas claras de que la vitamina C, la vitamina E, la equinácea o el ejercicio reduzcan la probabilidad de contraer un resfrío. Sin embargo, siempre se recomienda descansar mucho, hacer ejercicio y alimentarse bien.   SOLICITE ATENCIÓN MÉDICA SI:   · Su estado empeora en lugar de mejorar.  · Los medicamentos no logran controlar los síntomas.  · Tiene escalofríos.  · La sensación de falta de aire empeora.  · Tiene mucosidad marrón o roja.  · Tiene secreción nasal amarilla o marrón.  · Le duele la cara, especialmente al inclinarse hacia adelante.  · Tiene fiebre.  · Tiene los ganglios del cuello hinchados.  · Siente dolor al tragar.  · Tiene zonas blancas en la parte de atrás de la garganta.  SOLICITE ATENCIÓN MÉDICA DE INMEDIATO SI:   · Tiene síntomas intensos o persistentes de:    Dolor de cabeza.    Dolor de oídos.    Dolor sinusal.    Dolor en el pecho.  · Tiene enfermedad pulmonar crónica y cualquiera de estos síntomas:    Sibilancias.    Tos prolongada.    Tos con sangre.    Cambio en la mucosidad habitual.  · Presenta rigidez en el cuello.  · Tiene cambios en:    La visión.    La audición.    El pensamiento.    El estado de ánimo.  ASEGÚRESE DE QUE:   · Comprende estas instrucciones.  · Controlará su afección.  · Recibirá ayuda de inmediato si no mejora o si empeora.     Esta información no tiene como  fin reemplazar el consejo del médico. Asegúrese de hacerle al médico cualquier pregunta que tenga.     Document Released: 07/25/2005 Document Revised: 03/01/2015  Elsevier Interactive Patient Education ©2016 Elsevier Inc.

## 2015-09-23 NOTE — Progress Notes (Signed)
Subjective:    Patient ID: [name], female    DOB: [birth year], 39 y.o.   MRN: [id number]  09/23/2015  Diabetes   HPI This 39 y.o. female presents for evaluation of the following:   1.  Cold:  +sweats; no fever; +chills.  +HA  +sinus pressure intermittently; +rhinorrhea; +nasal congestion; +cough; no SOB; no wheezing; no sputum.  No vomiting or diarrhea.  No medication for symptoms.  s/p flu vaccine.   2.  Upper lip swelling:  Onset four days ago.  Fissure of R upper lip yesterday; husband bought cream/ carmax with improvement.  Feels throbbing with bending over.  No tongue swelling. No throat swelling.  No rash or hives or urticaria.   3. Hyperthyroidism: medication for ablation.  Contacted to schedule procedure.  Pt called office the next week; no medication in freezer.  Recommended to call on Monday; called back the following day.  No medication again.  Nuclear medicine. Not aware of medication; must be in isolation for three days; waiting for one month.  When calls, gets same answer that no one is aware that medication has arrived.  Went in person to handle issue.  Asked to speak with Dr. Ebony Hail regarding status.  Request to send email to clarify with endocrinology.    4.  Perioral dermatitis: s/p Doxycycline, clindamycin gel with improvement in rash; area is clearing up.     Review of Systems  Constitutional: Positive for chills, diaphoresis and fatigue. Negative for fever.  HENT: Positive for congestion, postnasal drip, rhinorrhea and sinus pressure. Negative for ear pain and sore throat.   Eyes: Negative for visual disturbance.  Respiratory: Positive for cough. Negative for shortness of breath.   Cardiovascular: Negative for chest pain, palpitations and leg swelling.  Gastrointestinal: Negative for nausea, vomiting, abdominal pain, diarrhea and constipation.  Endocrine: Negative for cold intolerance, heat intolerance, polydipsia, polyphagia and polyuria.  Skin: Positive  for rash.  Neurological: Positive for headaches. Negative for dizziness, tremors, seizures, syncope, facial asymmetry, speech difficulty, weakness, light-headedness and numbness.    Past Medical History  Diagnosis Date  . H. pylori infection     dx  09-02-2014-- treated w/ antibiotic  . GERD (gastroesophageal reflux disease)   . Type 2 diabetes mellitus ([place])   . Carpal tunnel syndrome, bilateral   . PCOS (polycystic ovarian syndrome)   . Menorrhagia   . Irregular menstrual cycle   . Hypertension   . Depression    Past Surgical History  Procedure Laterality Date  . Cesarean section  1995  . Carpal tunnel release Bilateral 10/07/2014    Procedure: CARPAL TUNNEL RELEASE BILATERAL;  Surgeon: [name], MD;  Location: [facility];  Service: Orthopedics;  Laterality: Bilateral;   Allergies  Allergen Reactions  . 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 Shrimp [Shellfish Allergy] Anaphylaxis    Social History   Social History  . Marital Status: Married    Spouse Name: N/A  . Number of Children: 2  . Years of Education: N/A   Occupational History  . Not on file.   Social History Main Topics  . Smoking status: Never Smoker   . Smokeless tobacco: Never Used  . Alcohol Use: 0.0 oz/week    0 Standard drinks or equivalent per week     Comment: socially  . Drug Use: No  . Sexual Activity:    Partners: Male    Birth Control/ Protection: None   Other Topics Concern  . Not on  file   Social History Narrative   No reported caffeine use    Family History  Problem Relation Age of Onset  . Heart disease Mother   . Diabetes Mother   . Hypertension Mother   . Cancer Father   . Thyroid disease Neg Hx        Objective:    BP 139/70 mmHg  Pulse 83  Temp(Src) 97.9 F (36.6 C) (Oral)  Resp 16  Wt 272 lb 3.2 oz (123.469 kg)  SpO2 98%  LMP 09/02/2015 Physical Exam  Constitutional: She is oriented to person, place, and time. She appears well-developed  and well-nourished. No distress.  HENT:  Head: Normocephalic and atraumatic.  Right Ear: Tympanic membrane, external ear and ear canal normal.  Left Ear: Tympanic membrane, external ear and ear canal normal.  Nose: Mucosal edema and rhinorrhea present. Right sinus exhibits no maxillary sinus tenderness and no frontal sinus tenderness. Left sinus exhibits no maxillary sinus tenderness and no frontal sinus tenderness.  Mouth/Throat: Oropharynx is clear and moist.  Minimal upper lip swelling.  No rash.   Eyes: Conjunctivae and EOM are normal. Pupils are equal, round, and reactive to light.  Neck: Normal range of motion. Neck supple. Carotid bruit is not present. No tracheal deviation present. No thyromegaly present.  Cardiovascular: Normal rate, regular rhythm, normal heart sounds and intact distal pulses.  Exam reveals no gallop and no friction rub.   No murmur heard. Pulmonary/Chest: Effort normal and breath sounds normal. She has no wheezes. She has no rales.  Abdominal: Soft. Bowel sounds are normal. She exhibits no distension and no mass. There is no tenderness. There is no rebound and no guarding.  Lymphadenopathy:    She has cervical adenopathy.  Neurological: She is alert and oriented to person, place, and time. No cranial nerve deficit.  Skin: Skin is warm and dry. No rash noted. She is not diaphoretic. No erythema. No pallor.  Psychiatric: She has a normal mood and affect. Her behavior is normal.   Results for orders placed or performed in visit on 09/23/15  CBC with Differential/Platelet  Result Value Ref Range   WBC 9.2 4.0 - 10.5 K/uL   RBC 4.91 3.87 - 5.11 MIL/uL   Hemoglobin 14.1 12.0 - 15.0 g/dL   HCT 42.7 36.0 - 46.0 %   MCV 87.0 78.0 - 100.0 fL   MCH 28.7 26.0 - 34.0 pg   MCHC 33.0 30.0 - 36.0 g/dL   RDW 13.0 11.5 - 15.5 %   Platelets 374 150 - 400 K/uL   MPV 9.3 8.6 - 12.4 fL   Neutrophils Relative % 51 43 - 77 %   Neutro Abs 4.7 1.7 - 7.7 K/uL   Lymphocytes  Relative 37 12 - 46 %   Lymphs Abs 3.4 0.7 - 4.0 K/uL   Monocytes Relative 9 3 - 12 %   Monocytes Absolute 0.8 0.1 - 1.0 K/uL   Eosinophils Relative 2 0 - 5 %   Eosinophils Absolute 0.2 0.0 - 0.7 K/uL   Basophils Relative 1 0 - 1 %   Basophils Absolute 0.1 0.0 - 0.1 K/uL   Smear Review Criteria for review not met   POCT Influenza A/B  Result Value Ref Range   Influenza A, POC Negative Negative   Influenza B, POC Negative Negative        Assessment & Plan:   1. Acute upper respiratory infection   2. Hyperthyroidism   3. Swelling of upper lip    -  New. -Consistent with viral syndrome.   -Supportive care with rest, Tylenol or Ibuprofen. -Rx for Mucinex DM bid and Tessalon Perles provided. -RTC for acute worsening. -supportive care of upper lip; RTC for acute worsening. -will clarify when patient has appointment for thyroid ablation.   Orders Placed This Encounter  Procedures  . CBC with Differential/Platelet  . POCT Influenza A/B   Meds ordered this encounter  Medications  . Dextromethorphan-Guaifenesin (MUCINEX DM) 30-600 MG TB12    Sig: Take 1 tablet by mouth 2 (two) times daily as needed.    Dispense:  28 each    Refill:  0  . benzonatate (TESSALON) 100 MG capsule    Sig: Take 1-2 capsules (100-200 mg total) by mouth 3 (three) times daily as needed for cough.    Dispense:  40 capsule    Refill:  0    No Follow-up on file.    Meloney Feld Elayne Guerin, M.D. [facility] [address] [place], [place]  [address] [phone] phone [phone] fax

## 2015-09-28 ENCOUNTER — Encounter: Payer: Self-pay | Admitting: Endocrinology

## 2015-09-29 DIAGNOSIS — E890 Postprocedural hypothyroidism: Secondary | ICD-10-CM

## 2015-09-29 HISTORY — DX: Postprocedural hypothyroidism: E89.0

## 2015-09-29 NOTE — Telephone Encounter (Signed)
error 

## 2015-10-06 ENCOUNTER — Encounter (HOSPITAL_COMMUNITY): Admission: RE | Admit: 2015-10-06 | Payer: BLUE CROSS/BLUE SHIELD | Source: Ambulatory Visit

## 2015-10-07 ENCOUNTER — Other Ambulatory Visit: Payer: Self-pay | Admitting: Family Medicine

## 2015-10-13 ENCOUNTER — Encounter (HOSPITAL_COMMUNITY)
Admission: RE | Admit: 2015-10-13 | Discharge: 2015-10-13 | Disposition: A | Discharge status: Home / Self Care | Payer: BLUE CROSS/BLUE SHIELD | Source: Ambulatory Visit | Attending: Endocrinology | Admitting: Endocrinology

## 2015-10-13 DIAGNOSIS — E0590 Thyrotoxicosis, unspecified without thyrotoxic crisis or storm: Secondary | ICD-10-CM | POA: Insufficient documentation

## 2015-10-13 LAB — HCG, SERUM, QUALITATIVE: PREG SERUM: NEGATIVE

## 2015-10-13 MED ORDER — SODIUM IODIDE I 131 CAPSULE
20.0000 | Freq: Once | INTRAVENOUS | Status: AC | PRN
  Administered 2015-10-13: 20.3 via ORAL

## 2015-10-21 ENCOUNTER — Ambulatory Visit: Payer: BLUE CROSS/BLUE SHIELD | Admitting: Family Medicine

## 2015-10-26 ENCOUNTER — Encounter: Payer: Self-pay | Admitting: Family Medicine

## 2015-10-28 ENCOUNTER — Encounter: Payer: Self-pay | Admitting: Family Medicine

## 2015-11-01 ENCOUNTER — Ambulatory Visit (INDEPENDENT_AMBULATORY_CARE_PROVIDER_SITE_OTHER): Payer: BLUE CROSS/BLUE SHIELD | Admitting: Physician Assistant

## 2015-11-01 ENCOUNTER — Ambulatory Visit (INDEPENDENT_AMBULATORY_CARE_PROVIDER_SITE_OTHER): Payer: BLUE CROSS/BLUE SHIELD | Admitting: Family Medicine

## 2015-11-01 VITALS — BP 118/69 | HR 90 | Temp 98.1°F | Resp 20 | Ht 63.0 in | Wt 278.8 lb

## 2015-11-01 DIAGNOSIS — R200 Anesthesia of skin: Secondary | ICD-10-CM

## 2015-11-01 DIAGNOSIS — G5622 Lesion of ulnar nerve, left upper limb: Secondary | ICD-10-CM | POA: Diagnosis not present

## 2015-11-01 DIAGNOSIS — F32A Depression, unspecified: Secondary | ICD-10-CM

## 2015-11-01 DIAGNOSIS — F329 Major depressive disorder, single episode, unspecified: Secondary | ICD-10-CM

## 2015-11-01 DIAGNOSIS — T7840XS Allergy, unspecified, sequela: Secondary | ICD-10-CM

## 2015-11-01 DIAGNOSIS — E119 Type 2 diabetes mellitus without complications: Secondary | ICD-10-CM

## 2015-11-01 DIAGNOSIS — R208 Other disturbances of skin sensation: Secondary | ICD-10-CM

## 2015-11-01 DIAGNOSIS — I10 Essential (primary) hypertension: Secondary | ICD-10-CM | POA: Diagnosis not present

## 2015-11-01 MED ORDER — LISINOPRIL 10 MG PO TABS: 10.0000 mg | ORAL_TABLET | Freq: Every day | ORAL | Status: DC

## 2015-11-01 MED ORDER — FLUOXETINE HCL 20 MG PO TABS: 40.0000 mg | ORAL_TABLET | Freq: Every day | ORAL | Status: DC

## 2015-11-01 MED ORDER — CANAGLIFLOZIN 100 MG PO TABS: 100.0000 mg | ORAL_TABLET | Freq: Every day | ORAL | Status: DC

## 2015-11-01 NOTE — Progress Notes (Signed)
[facility] [address], [place] [address] [phone]  Date:  11/01/2015   Name:  [name]   DOB:  1976/05/28   MRN:  [id number]  PCP:  Reginia Forts, MD    Chief Complaint: Numbness and Medication Refill   History of Present Illness:  This is a 40 y.o. female with PMH hyperthyroidism, HTN, DM2, PCOS, obesity, OSA on CPAP who is presenting needing medication refills. Needs refills of prozac, lisinopril and invokana.  PCP: Dr. Tamala Julian. Last saw 09/14/15. A1C at that time 6.7. Last A1C before that 7/16: 8.8. She has upcoming appt with Dr Tamala Julian at the end of January.  Doing well on prozac.   Pt is complaining of left arm numbness x 2 days. She states yesterday she was out of town. She went to a restaurant and got fish soup. She is allergic to shrimp and told the wait staff that. While eating the soup she started to feel flushed. She looked through the soup and found a small shrimp. She did not have her epipen with her. She has had this reaction once before - flushing and sob occurred that time. This again occurred - lasted 1 hour and went away. No oral or face swelling. After those symptoms went away, she noticed some left arm numbness/tingling. She went to bed and this morning the tingling was worse. She is having some left shoulder pain and then her forearm and fingers feel numb. This has never happened before. She denies cp, palps, dizziness, blurred vision, sob, le edema. She works in a [employer], lifting heavy boxes >50 pounds. 1 year ago she had bilateral carpal tunnel release surgery.  Review of Systems:  Review of Systems See HPI  Patient Active Problem List   Diagnosis Date Noted  . Hyperthyroidism 07/28/2015  . Essential hypertension, benign 06/29/2015  . Allergic rhinitis due to pollen 06/29/2015  . Anxiety and depression 06/29/2015  . Gastroesophageal reflux disease without esophagitis 06/29/2015  . OSA on CPAP 06/29/2015  . History of  syphilis 05/23/2015  . Amenorrhea 05/23/2015  . Severe obesity (BMI >= 40) ([place]) 09/14/2014  . Irregular menstrual cycle 11/12/2013  . Type II diabetes mellitus, uncontrolled ([place]) 06/20/2009  . POLYCYSTIC OVARIAN DISEASE 06/20/2009  . CARPAL TUNNEL SYNDROME 06/20/2009    Prior to Admission medications   Medication Sig Start Date End Date Taking? Authorizing Provider  clindamycin (CLINDAGEL) 1 % gel Apply topically 2 (two) times daily. 09/14/15  Yes Wardell Honour, MD  clobetasol cream (TEMOVATE) [id number] % Apply 1 application topically 2 (two) times daily. x10d 09/14/15  Yes Wardell Honour, MD  EPINEPHrine (EPIPEN 2-PAK) 0.3 mg/0.3 mL IJ SOAJ injection Inject 0.3 mLs (0.3 mg total) into the muscle once. 05/09/15  Yes Orpah Greek, MD  fluticasone (FLONASE) 50 MCG/ACT nasal spray Place 2 sprays into both nostrils daily. 01/15/15  Yes Bennett Scrape V, PA-C  glimepiride (AMARYL) 4 MG tablet Take 2 tabs (8 mg) po twice daily. 06/11/15  Yes Wardell Honour, MD  hydrochlorothiazide (HYDRODIURIL) 25 MG tablet Take 1 tablet (25 mg total) by mouth daily. 03/23/15  Yes Wardell Honour, MD  INVOKANA 100 MG TABS tablet TAKE ONE TABLET BY MOUTH ONCE DAILY 10/10/15  Yes Wardell Honour, MD  lisinopril (PRINIVIL,ZESTRIL) 10 MG tablet Take 1 tablet (10 mg total) by mouth daily. 08/12/15  Yes Wardell Honour, MD  meloxicam (MOBIC) 15 MG tablet Take 1 tablet (15 mg total) by mouth daily.  09/14/15  Yes Wardell Honour, MD  metFORMIN (GLUCOPHAGE) 500 MG tablet Take 2 tabs by mouth (1000 mg ) 2 times daily. 06/29/15  Yes Wardell Honour, MD  methocarbamol (ROBAXIN) 500 MG tablet Take 1-2 tablets (500-1,000 mg total) by mouth 4 (four) times daily. 09/18/15  Yes Wardell Honour, MD  metoprolol succinate (TOPROL-XL) 25 MG 24 hr tablet Take 1 tablet (25 mg total) by mouth daily. 08/12/15  Yes Wardell Honour, MD  omeprazole (PRILOSEC) 20 MG capsule Take 1 capsule (20 mg total) by mouth daily. 03/23/15  Yes Wardell Honour, MD   saxagliptin HCl (ONGLYZA) 5 MG TABS tablet Take 1 tablet (5 mg total) by mouth daily. 03/31/15  Yes Wardell Honour, MD  FLUoxetine (PROZAC) 20 MG tablet Take 2 tablets (40 mg total) by mouth daily. Patient not taking: Reported on 11/01/2015 04/22/15   Wardell Honour, MD  medroxyPROGESTERone (PROVERA) 10 MG tablet Take one trablet daily for 10 days of the month if no spontaneous menses every 30 days IF negative home UPT Patient not taking: Reported on 11/01/2015 05/23/15   Terrance Mass, MD           Allergies  Allergen Reactions  . 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 Shrimp [Shellfish Allergy] Anaphylaxis    Past Surgical History  Procedure Laterality Date  . Cesarean section  1995  . Carpal tunnel release Bilateral 10/07/2014    Procedure: CARPAL TUNNEL RELEASE BILATERAL;  Surgeon: [name], MD;  Location: [facility];  Service: Orthopedics;  Laterality: Bilateral;    Social History  Substance Use Topics  . Smoking status: Never Smoker   . Smokeless tobacco: Never Used  . Alcohol Use: 0.0 oz/week    0 Standard drinks or equivalent per week     Comment: socially    Family History  Problem Relation Age of Onset  . Heart disease Mother   . Diabetes Mother   . Hypertension Mother   . Cancer Father   . Thyroid disease Neg Hx     Medication list has been reviewed and updated.  Physical Examination:  Physical Exam  Constitutional: She is oriented to person, place, and time. She appears well-developed and well-nourished. No distress.  HENT:  Head: Normocephalic and atraumatic.  Right Ear: Hearing normal.  Left Ear: Hearing normal.  Nose: Nose normal.  Eyes: Conjunctivae and lids are normal. Right eye exhibits no discharge. Left eye exhibits no discharge. No scleral icterus.  Neck: Trachea normal. Carotid bruit is not present. No thyromegaly present.  Cardiovascular: Normal rate, regular rhythm, normal heart sounds and normal pulses.   No murmur  heard. Pulmonary/Chest: Effort normal and breath sounds normal. No respiratory distress. She has no wheezes. She has no rhonchi. She has no rales. She exhibits no tenderness.  Abdominal: Soft. There is no tenderness.  obese  Musculoskeletal: Normal range of motion.       Right elbow: Normal.      Left elbow: She exhibits normal range of motion and no swelling. Tenderness found. Lateral epicondyle tenderness noted.       Cervical back: Normal. She exhibits normal range of motion.       Right upper arm: Normal.       Left upper arm: Normal.  Tender left trapezius tinels over left cubital tunnel positive Sensation intact however pt reports reduced sensation/tingling in ulnar distribution  Lymphadenopathy:    She has no cervical adenopathy.  Neurological: She is alert  and oriented to person, place, and time. She has normal strength. Gait normal.  Skin: Skin is warm, dry and intact. No lesion and no rash noted.  Psychiatric: She has a normal mood and affect. Her speech is normal and behavior is normal. Thought content normal.   BP 118/69 mmHg  Pulse 90  Temp(Src) 98.1 F (36.7 C) (Oral)  Resp 20  Ht 5\' 3"  (1.6 m)  Wt 278 lb 12.8 oz (126.463 kg)  BMI 49.40 kg/m2  SpO2 97%  LMP 10/13/2015 (Exact Date)  EKG interpreted with Dr. Carlota Raspberry: NSR  Assessment and Plan:  1. Cubital tunnel syndrome, left 2. Left arm numbness EKG NSR with no evidence ischemia. Left arm numbness consistent with cubital tunnel syndrome. Pain over lateral epicondyle, tinels positive, numbness/tingling in ulnar distribution. Provided with ace wrap. She will night splint by wrapping arm in towel and ace wrap at 45 degree angle. Meloxicam for pain. Advised not resting elbow on hard surfaces, rest on a silicone surface. She has upcoming appt with Dr. Tamala Julian - f/u at that time. If not getting better, refer to ortho. - EKG 12-Lead  3. Essential hypertension Stable. Refilled. - lisinopril (PRINIVIL,ZESTRIL) 10 MG tablet;  Take 1 tablet (10 mg total) by mouth daily.  Dispense: 90 tablet; Refill: 3  4. Type 2 diabetes mellitus without complication, without long-term current use of insulin ([place]) Doing well. Refilled. F/u Dr. Tamala Julian. - canagliflozin (INVOKANA) 100 MG TABS tablet; Take 1 tablet (100 mg total) by mouth daily.  Dispense: 30 tablet; Refill: 5  5. Depression Stable. Refilled. - FLUoxetine (PROZAC) 20 MG tablet; Take 2 tablets (40 mg total) by mouth daily.  Dispense: 60 tablet; Refill: 5  6. Allergic reaction Stressed the importance of keeping her epipen with her at all times.  Benjaman Pott Drenda Freeze, MHS [facility]  11/06/2015

## 2015-11-01 NOTE — Patient Instructions (Addendum)
Take oxy tonight Tomorrow take meloxicam Do not rest your elbow on hard surfaces. Rest on a jelly surface like a mouse pad Wrap your arm in a towel at 45 degree angle in sleep like that. If not getting better by your appt with Dr. Tamala Julian, let her know, may send you to see ortho  Continue all current meds.

## 2015-11-11 ENCOUNTER — Encounter: Payer: Self-pay | Admitting: Family Medicine

## 2015-11-18 ENCOUNTER — Encounter: Payer: Self-pay | Admitting: Family Medicine

## 2015-11-18 ENCOUNTER — Ambulatory Visit (INDEPENDENT_AMBULATORY_CARE_PROVIDER_SITE_OTHER): Payer: BLUE CROSS/BLUE SHIELD | Admitting: Family Medicine

## 2015-11-18 VITALS — BP 112/67 | HR 81 | Temp 98.3°F | Resp 16 | Ht 63.5 in | Wt 275.6 lb

## 2015-11-18 DIAGNOSIS — Z0000 Encounter for general adult medical examination without abnormal findings: Secondary | ICD-10-CM | POA: Diagnosis not present

## 2015-11-18 DIAGNOSIS — F418 Other specified anxiety disorders: Secondary | ICD-10-CM | POA: Diagnosis not present

## 2015-11-18 DIAGNOSIS — E119 Type 2 diabetes mellitus without complications: Secondary | ICD-10-CM

## 2015-11-18 DIAGNOSIS — I10 Essential (primary) hypertension: Secondary | ICD-10-CM | POA: Diagnosis not present

## 2015-11-18 DIAGNOSIS — F32A Depression, unspecified: Secondary | ICD-10-CM

## 2015-11-18 DIAGNOSIS — F419 Anxiety disorder, unspecified: Secondary | ICD-10-CM

## 2015-11-18 DIAGNOSIS — E0590 Thyrotoxicosis, unspecified without thyrotoxic crisis or storm: Secondary | ICD-10-CM

## 2015-11-18 DIAGNOSIS — F329 Major depressive disorder, single episode, unspecified: Secondary | ICD-10-CM

## 2015-11-18 MED ORDER — DICLOFENAC SODIUM 1 % TD GEL: 2.0000 g | Freq: Four times a day (QID) | TRANSDERMAL | Status: DC

## 2015-11-18 NOTE — Patient Instructions (Addendum)
Keeping You Healthy  Get These Tests  Blood Pressure- Have your blood pressure checked once a year by your health care provider.  Normal blood pressure is 120/80.  Weight- Have your body mass index (BMI) calculated to screen for obesity.  BMI is measure of body fat based on height and weight.  You can also calculate your own BMI at GravelBags.it.  Cholesterol- Have your cholesterol checked every 5 years starting at age 40 then yearly starting at age 72.  Chlamydia, HIV, and other sexually transmitted diseases- Get screened every year until age 81, then within three months of each new sexual provider.  Pap Test - Every 1-5 years; discuss with your health care provider.  Mammogram- Every 1-2 years starting at age 58--50  Take these medicines  Calcium with Vitamin D-Your body needs 1200 mg of Calcium each day and [phone] IU of Vitamin D daily.  Your body can only absorb 500 mg of Calcium at a time so Calcium must be taken in 2 or 3 divided doses throughout the day.  Multivitamin with folic acid- Once daily if it is possible for you to become pregnant.  Get these Immunizations  Gardasil-Series of three doses; prevents HPV related illness such as genital warts and cervical cancer.  Menactra-Single dose; prevents meningitis.  Tetanus shot- Every 10 years.  Flu shot-Every year.  Take these steps  Do not smoke-Your healthcare provider can help you quit.  For tips on how to quit go to www.smokefree.gov or call 1-800 QUITNOW.  Be physically active- Exercise 5 days a week for at least 30 minutes.  If you are not already physically active, start slow and gradually work up to 30 minutes of moderate physical activity.  Examples of moderate activity include walking briskly, dancing, swimming, bicycling, etc.  Breast Cancer- A self breast exam every month is important for early detection of breast cancer.  For more information and instruction on self breast exams, ask your  healthcare provider or https://www.patel.info/.  Eat a healthy diet- Eat a variety of healthy foods such as fruits, vegetables, whole grains, low fat milk, low fat cheeses, yogurt, lean meats, poultry and fish, beans, nuts, tofu, etc.  For more information go to www. Thenutritionsource.org  Drink alcohol in moderation- Limit alcohol intake to one drink or less per day. Never drink and drive.  Depression- Your emotional health is as important as your physical health.  If you're feeling down or losing interest in things you normally enjoy please talk to your healthcare provider about being screened for depression.  Dental visit- Brush and floss your teeth twice daily; visit your dentist twice a year.  Eye doctor- Get an eye exam at least every 2 years.  Helmet use- Always wear a helmet when riding a bicycle, motorcycle, rollerblading or skateboarding.  Safe sex- If you may be exposed to sexually transmitted infections, use a condom.  Seat belts- Seat belts can save your live; always wear one.  Smoke/Carbon Monoxide detectors- These detectors need to be installed on the appropriate level of your home. Replace batteries at least once a year.  Skin cancer- When out in the sun please cover up and use sunscreen 15 SPF or higher.  Violence- If anyone is threatening or hurting you, please tell your healthcare provider.       Epicondilitis lateral con rehabilitacin (Lateral Epicondylitis With Rehab) La epicondilitis lateral consiste en la inflamacin y dolor alrededor de la regin externa del codo. El dolor tiene su origen en la inflamacin  de los tendones del antebrazo que extienden [employer]. La epicondilitis lateral, tambin es denominada codo de [place] debido a que es muy frecuente [employer] jugadores de tenis. Sin embargo, Administrator, Civil Service individuo que extienda la mueca de [place] repetitiva. Si esta afeccin no se trata, puede transformarse en un problema  crnico. SNTOMAS  Dolor y sensibilidad e inflamacin en la zona externa (lateral) del codo.  Dolor o debilidad al tomar [employer].  Dolor que [place] con los movimientos de rotacin de la mueca (jugar al tenis, usar un Information systems manager, abrir una puerta o un frasco).  Dolor al levantar objetos, inclusive [place] taza de caf. CAUSAS  La causa de la epicondilitis lateral es la inflamacin de los tendones que extienden la [place]. Entre las causas se incluyen:  Psychologist, forensic repetitivo y distensin de los msculos y los tendones que extienden la [place].  Cambio repentino en el nivel o intensidad de la [place].  Agarre incorrecto en los deportes con raqueta.  Tamao incorrecto del puo de la raqueta (con frecuencia demasiado grande).  Posicin o tcnica incorrecta al [employer] un golpe (generalmente con el dorso de la mano; llevada por el codo).  Utilizar una raqueta demasiado pesada. LOS RIESGOS AUMENTAN CON:  Los deportes u ocupaciones que requieren movimientos repetitivos y extenuantes del antebrazo y la [place] (tenis, squash, deportes con raqueta, carpintera).  Poca fuerza y flexibilidad de la [place] y [place].  Precalentamiento y elongacin inadecuados antes de la [place].  Regreso a la actividad antes de [employer] curacin, la rehabilitacin y Museum/gallery exhibitions officer. PREVENCIN  Precalentamiento adecuado y elongacin antes de la [place].  Mantener la forma fsica:  Kerry Hough, flexibilidad y resistencia muscular.  Capacidad cardiovascular.  Utilice un equipo que le ajuste adecuadamente.  Usar la tcnica correcta y Best boy un entrenador que corrija la tcnica incorrecta.  Use un vendaje para el codo apropiado para el tenis (contrafuerza). PRONSTICO El curso de la enfermedad depende del grado de la lesin. Si se trata adecuadamente, los casos agudos (sntomas que duran menos de 4 semanas) generalmente se resuelven en un perodo de 2 a 6 semanas. Los casos crnicos (que duran  ms tiempo) se resuelven en un lapso de 3 a 6 meses, pero pueden requerir de tratamiento fisioteraputico. COMPLICACIONES RELACIONADAS  La recurrencia frecuente de los sntomas puede dar como resultado un problema crnico. Un tratamiento adecuado en su inicio disminuye la probabilidad de recurrencias.  La inflamacin crnica, degeneracin cicatrizal del tendn y ruptura parcial del tendn, requieren [place].  Demora de la curacin o de la resolucin de los sntomas. TRATAMIENTO El tratamiento inicial consiste en la toma de medicamentos y la aplicacin de hielo para Best boy y reducir la hinchazn. Los ejercicios de elongacin y fortalecimiento pueden ayudar a reducir las molestias si se realizan con regularidad. Podr realizar estos ejercicios en su casa, si se trata de una afeccin aguda. Los casos crnicos pueden requerir la derivacin a un fisioterapeuta para Film/video editor evaluacin y Manufacturing systems engineer. Su mdico podr indicarle inyecciones con corticoides para reducir la inflamacin. Raras veces es necesario someterse a [place]. MEDICAMENTOS   Si es necesaria la administracin de medicamentos para Conservation officer, historic buildings, se recomiendan los antiinflamatorios no esteroides, (aspirina e ibuprofeno) u otros calmantes menores (acetaminofeno).  No tome medicamentos para el dolor dentro de los 7 das previos a la [place].  El profesional podr prescribirle calmantes si lo considera necesario. Utilcelos como se le indique y slo cuando lo necesite.  Se podrn recomendar inyecciones de  corticoesteorides. Estas inyecciones deben reservarse para los casos graves, porque slo se pueden administrar una determinada cantidad de veces. CALOR Y FRO  El fro debe aplicarse durante 10 a 15 minutos cada 2  3 horas para reducir la inflamacin y Conservation officer, historic buildings e inmediatamente despus de cualquier actividad que agrava los sntomas. Utilice bolsas o un masaje de hielo.  El calor puede usarse antes de Neurosurgeon y  [place] fortalecimiento indicadas por el profesional, le fisioterapeuta o Industrial/product designer. Utilice una bolsa trmica o un pao hmedo. SOLICITE ATENCIN MDICA SI: Los sntomas empeoran o no mejoran en 2 semanas, a pesar de Chiropodist. EJERCICIOS EJERCICIOS DE AMPLITUD DE MOVIMIENTOS Y ELONGACIN - Epicondilitis lateral (codo de [place]) Estos ejercicios le ayudarn en la recuperacin de la lesin. Los sntomas podrn desaparecer con o sin mayor intervencin del profesional, el fisioterapeuta o Industrial/product designer. Al completar estos ejercicios, recuerde:   Restaurar la flexibilidad del tejido ayuda a que las articulaciones recuperen el movimiento normal. Esto permite que el movimiento y la actividad sea ms saludables y menos dolorosos.  Para que sea efectiva, cada elongacin debe realizarse durante al menos 30 segundos.  La elongacin nunca debe ser dolorosa. Deber sentir slo un alargamiento suave o elongacin del tejido que estira. [place] de la [place], asistida  Extienda el codo derecha / izquierdo con los dedos apuntando [place] abajo.*  Tire suavemente la palma de la mano hacia usted, hasta que sienta un ligero estiramiento de la parte superior del [place].  Mantenga esta posicin durante __________ segundos. Reptalo __________ veces. Realice este estiramiento __________ Vicenta Aly por da.  *Realice este ejercicio con el codo flexionado en lugar de extendido si el mdico, fisioterapeuta o entrenador se lo indican. [place] de la [place], asistida  Extienda el codo derecha / izquierdo con la palma [place] arriba.*  Tire suavemente la palma y la punta de los dedos [place] atrs, para que la [place] se extienda y los dedos apunten hacia el suelo.  Debe sentir un ligero estiramiento en la parte interior del antebrazo.  Mantenga esta posicin durante __________segundos. Reptalo __________ veces. Realice este estiramiento  __________ Vicenta Aly por da. *Realice este ejercicio con el codo flexionado en lugar de extendido si el mdico, fisioterapeuta o entrenador se lo indican. FUERZA - Flexin de la [employer] la palma de la mano derecha / izquierdo plana sobre una mesa, con el codo ligeramente doblado. Los dedos deben apuntar hacia el lado contrario del cuerpo.  Presione suavamente la parte de atrs de la mano en la mesa y enderece el codo. Debe sentir un ligero estiramiento en la parte superior del antebrazo.  Mantenga esta posicin durante __________ segundos. Reptalo __________ veces. Realice este estiramiento __________ Vicenta Aly por da.  [place] de la [employer] puntas de los dedos de la mano derecha / izquierdo plana sobre una mesa, con el codo ligeramente doblado. Los dedos deben apuntar [employer].  Presione suavamente los dedos y la mano en la mesa y enderece el codo. Debe sentir un ligero estiramiento en la parte interna del antebrazo.  Mantenga esta posicin durante __________ segundos. Reptalo __________ veces. Realice este estiramiento __________ Vicenta Aly por da.  EJERCICIOS DE FORTALECIMIENTO - Epicondilitis lateral (codo de [place]) Estos ejercicios le ayudarn en la recuperacin de la lesin. Los sntomas podrn desaparecer con o sin mayor intervencin del profesional, el fisioterapeuta o Industrial/product designer. Al completar estos ejercicios, recuerde:   Los  msculos pueden ganar tanto la resistencia como la fuerza que necesita para sus actividades diarias a travs de ejercicios controlados.  Realice los ejercicios como se lo indic el mdico, el fisioterapeuta o Industrial/product designer. Aumente la resistencia y repeticiones segn se le haya indicado.  Podr experimentar dolor o cansancio muscular, pero el dolor o molestia que trata de eliminar a travs de los ejercicios nunca debe empeorar. Si el dolor empeora, detngase y asegrese de que est siguiendo las directivas correctamente. Si an  siente dolor luego de Optometrist lo ajustes necesarios, deber discontinuar el ejercicio hasta que pueda conversar con el profesional sobre el problema. FUERZA - Flexores de la mueca  Sintese con el antebrazo derecha / izquierdo con la palma hacia arriba y completamente apoyado sobre una mesa o [place]. El codo Neurosurgeon en reposo y a la altura de los hombros. Haga que la [place] se extienda sobre los extremos de la superficie.  Sostenga sin apretar un peso de __________, Merla Riches goma o tubo para ejercicios en ambas manos, y doble lentamente la mano hacia el [place].  Mantenga esta posicin durante __________ segundos. Baje lentamente la [employer] posicin inicial, de forma controlada. Reptalo __________ veces. Realice este estiramiento __________ Vicenta Aly por da.  FUERZA - Extensores de la mueca  Sintese con el antebrazo derecha / izquierdo con la palma hacia abajo y completamente apoyado sobre una mesa o [place]. El codo Neurosurgeon en reposo y a la altura de los hombros. Haga que la [place] se extienda sobre los extremos de la superficie.  Sostenga sin apretar un peso de __________, Merla Riches goma o tubo para ejercicios en ambas manos, y doble lentamente la mano hacia el [place].  Mantenga esta posicin durante __________ segundos. Baje lentamente la [employer] posicin inicial, de forma controlada. Reptalo __________ veces. Realice este estiramiento __________ Vicenta Aly por da.  FUERZA - Desviacin ulnar  Prese sosteniendo un peso de ____________________ en su mano derecha / izquierdo, o sintese sosteniendo una banda de goma para ejercicios, con el brazo sano apoyado en una mesa o mesada.  Mueva la [place], para que el dedo meique apunte hacia el antebrazo y Counselling psychologist en contra del mismo.  Mantenga esta posicin durante __________ segundos y luego lentamente baje la mueca a la posicin inicial. Reptalo __________ veces. Realice este ejercicio __________ veces por da. FUERZA -  Desviacin radial  Prese sosteniendo un peso de ____________________ en su mano derecha / izquierdo, o sintese sosteniendo una banda de goma para ejercicios, con el brazo lesionado apoyado en una mesa o [place].  Eleve la mano hacia arriba, por delante suyo o tire [place] arriba la banda de [place].  Mantenga esta posicin durante __________ segundos y luego lentamente baje la mueca a la posicin inicial. Reptalo __________ veces. Realice este estiramiento __________ Vicenta Aly por da. FUERZA - Supinadores del antebrazo  Sintese con su antebrazo derecha / izquierdo apoyado sobre una mesa, manteniendo el codo por debajo de la altura del hombro. Apoye la [employer] borde, con la palma [place].  Suavemente tome un martillo o un cucharn de sopa.  Sin mover el codo, gire lentamente la palma y la mano hacia arriba para colocar el "pulgar arriba".  Mantenga esta posicin durante __________ segundos. Vuelva lentamente a la posicin inicial. Reptalo __________ veces. Realice este estiramiento __________ Vicenta Aly por da.  FUERZA - Pronadores del antebrazo  Sintese con su antebrazo derecha / izquierdo apoyado sobre una mesa, manteniendo el codo por debajo de  la altura del hombro. Apoye la [employer] borde, con la palma [place].  Suavemente tome un martillo o un cucharn de sopa.  Sin mover el codo, gire lentamente la palma y la mano hacia arriba para colocar el "pulgar arriba".  Mantenga esta posicin durante __________ segundos. Vuelva lentamente a la posicin inicial. Reptalo __________ veces. Realice este estiramiento __________ Vicenta Aly por da.  [place] una pelota de tenis, una esponja dura o una media larga y [place].  Apritela lo ms fuerte que pueda, sin Corporate treasurer.  Mantenga esta posicin durante __________ segundos. Sultela lentamente. Reptalo __________ veces. Realice este estiramiento __________ Vicenta Aly por da.  FUERZA - Extensores del codo,  isomtrica  Prese o sintese erguido sobre una superficie firme. Coloque su brazo Recruitment consultant / izquierdo de modo que la palma de su mano quede frente al [place] y a la altura de su cintura.  Coloque la mano opuesta sobre lado inferior del [place]. Empuje suavemente hacia arriba mientras su brazo derecha / izquierdo opone resistencia. Empuje tan intensamente como pueda con ambos brazos sin causar Counselling psychologist ni realizar movimientos con su codo derecha / izquierdo. Mantenga esta posicin durante __________ segundos. Libere la tensin de ambos brazos gradualmente. Permita que sus msculos se relajen completamente antes de repetir.   Esta informacin no tiene Marine scientist el consejo del mdico. Asegrese de hacerle al mdico cualquier pregunta que tenga.   Document Released: 08/01/2006 Document Revised: 03/01/2015 Elsevier Interactive Patient Education [employer].

## 2015-11-18 NOTE — Progress Notes (Signed)
Subjective:    Patient ID: [name], female    DOB: 01-08-1976, 40 y.o.   MRN: [id number]  11/18/2015  Annual Exam and Medication Refill   HPI This 40 y.o. female presents for Complete Physical Examination.  Last physical: Pap smear:  10-29-2013 Mammogram:  never Colonoscopy: never TDAP:  2016 Pneumovax:  2016 Influenza:  2016 Eye exam:  10-02-2014; 10-11-15; +glasses Dental exam:  Seven weeks ago.      Review of Systems  Constitutional: Negative for fever, chills, diaphoresis, activity change, appetite change, fatigue and unexpected weight change.  HENT: Negative for congestion, dental problem, drooling, ear discharge, ear pain, facial swelling, hearing loss, mouth sores, nosebleeds, postnasal drip, rhinorrhea, sinus pressure, sneezing, sore throat, tinnitus, trouble swallowing and voice change.   Eyes: Negative for photophobia, pain, discharge, redness, itching and visual disturbance.  Respiratory: Negative for apnea, cough, choking, chest tightness, shortness of breath, wheezing and stridor.   Cardiovascular: Negative for chest pain, palpitations and leg swelling.  Gastrointestinal: Negative for nausea, vomiting, abdominal pain, diarrhea, constipation, blood in stool, abdominal distention, anal bleeding and rectal pain.  Endocrine: Negative for cold intolerance, heat intolerance, polydipsia, polyphagia and polyuria.  Genitourinary: Negative for dysuria, urgency, frequency, hematuria, flank pain, decreased urine volume, vaginal bleeding, vaginal discharge, enuresis, difficulty urinating, genital sores, vaginal pain, menstrual problem, pelvic pain and dyspareunia.  Musculoskeletal: Negative for myalgias, back pain, joint swelling, arthralgias, gait problem, neck pain and neck stiffness.  Skin: Negative for color change, pallor, rash and wound.  Allergic/Immunologic: Negative for environmental allergies, food allergies and immunocompromised state.  Neurological: Negative for  dizziness, tremors, seizures, syncope, facial asymmetry, speech difficulty, weakness, light-headedness, numbness and headaches.  Hematological: Negative for adenopathy. Does not bruise/bleed easily.  Psychiatric/Behavioral: Negative for suicidal ideas, hallucinations, behavioral problems, confusion, sleep disturbance, self-injury, dysphoric mood, decreased concentration and agitation. The patient is not nervous/anxious and is not hyperactive.        Bedtime 10:30pm; wakes up 5:00am.     Past Medical History  Diagnosis Date  . H. pylori infection     dx  09-02-2014-- treated w/ antibiotic  . GERD (gastroesophageal reflux disease)   . Type 2 diabetes mellitus ([place])   . Carpal tunnel syndrome, bilateral   . PCOS (polycystic ovarian syndrome)   . Menorrhagia   . Irregular menstrual cycle   . Hypertension   . Depression    Past Surgical History  Procedure Laterality Date  . Cesarean section  1995  . Carpal tunnel release Bilateral 10/07/2014    Procedure: CARPAL TUNNEL RELEASE BILATERAL;  Surgeon: [name], MD;  Location[facility];  Service: Orthopedics;  Laterality: Bilateral;   Allergies  Allergen Reactions  . 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 Shrimp [Shellfish Allergy] Anaphylaxis   Current Outpatient Prescriptions  Medication Sig Dispense Refill  . canagliflozin (INVOKANA) 100 MG TABS tablet Take 1 tablet (100 mg total) by mouth daily. 30 tablet 5  . clindamycin (CLINDAGEL) 1 % gel Apply topically 2 (two) times daily. 30 g 0  . clobetasol cream (TEMOVATE) [id number] % Apply 1 application topically 2 (two) times daily. x10d 60 g 0  . EPINEPHrine (EPIPEN 2-PAK) 0.3 mg/0.3 mL IJ SOAJ injection Inject 0.3 mLs (0.3 mg total) into the muscle once. 1 Device 2  . FLUoxetine (PROZAC) 20 MG tablet Take 2 tablets (40 mg total) by mouth daily. 60 tablet 5  . fluticasone (FLONASE) 50 MCG/ACT nasal spray Place 2 sprays into both nostrils daily. [place]  g 12  . glimepiride  (AMARYL) 4 MG tablet Take 2 tabs (8 mg) po twice daily. 120 tablet 3  . lisinopril (PRINIVIL,ZESTRIL) 10 MG tablet Take 1 tablet (10 mg total) by mouth daily. 90 tablet 3  . medroxyPROGESTERone (PROVERA) 10 MG tablet Take one trablet daily for 10 days of the month if no spontaneous menses every 30 days IF negative home UPT 30 tablet 4  . meloxicam (MOBIC) 15 MG tablet Take 1 tablet (15 mg total) by mouth daily. 30 tablet 1  . metFORMIN (GLUCOPHAGE) 500 MG tablet Take 2 tabs by mouth (1000 mg ) 2 times daily. 120 tablet 11  . metoprolol succinate (TOPROL-XL) 25 MG 24 hr tablet Take 1 tablet (25 mg total) by mouth daily. 90 tablet 3  . omeprazole (PRILOSEC) 20 MG capsule Take 1 capsule (20 mg total) by mouth daily. 90 capsule 3  . saxagliptin HCl (ONGLYZA) 5 MG TABS tablet Take 1 tablet (5 mg total) by mouth daily. 30 tablet 11  . hydrochlorothiazide (HYDRODIURIL) 25 MG tablet Take 1 tablet (25 mg total) by mouth daily. 90 tablet 3  . methocarbamol (ROBAXIN) 500 MG tablet Take 1-2 tablets (500-1,000 mg total) by mouth 4 (four) times daily. 40 tablet 0  . valACYclovir (VALTREX) 1000 MG tablet Take 1 tablet (1,000 mg total) by mouth 2 (two) times daily. (Patient not taking: Reported on 11/01/2015) 14 tablet 2   No current facility-administered medications for this visit.   Social History   Social History  . Marital Status: Married    Spouse Name: N/A  . Number of Children: 2  . Years of Education: N/A   Occupational History  . Not on file.   Social History Main Topics  . Smoking status: Never Smoker   . Smokeless tobacco: Never Used  . Alcohol Use: 0.0 oz/week    0 Standard drinks or equivalent per week     Comment: socially  . Drug Use: No  . Sexual Activity:    Partners: Male    Birth Control/ Protection: None   Other Topics Concern  . Not on file   Social History Narrative   No reported caffeine use    Family History  Problem Relation Age of Onset  . Heart disease Mother   .  Diabetes Mother   . Hypertension Mother   . Cancer Father   . Thyroid disease Neg Hx        Objective:    BP 112/67 mmHg  Pulse 81  Temp(Src) 98.3 F (36.8 C) (Oral)  Resp 16  Ht 5' 3.5" (1.613 m)  Wt 275 lb 9.6 oz (125.011 kg)  BMI 48.05 kg/m2  SpO2 97%  LMP 11/03/2015 Physical Exam  Constitutional: She is oriented to person, place, and time. She appears well-developed and well-nourished. No distress.  HENT:  Head: Normocephalic and atraumatic.  Right Ear: External ear normal.  Left Ear: External ear normal.  Nose: Nose normal.  Mouth/Throat: Oropharynx is clear and moist.  Eyes: Conjunctivae and EOM are normal. Pupils are equal, round, and reactive to light.  Neck: Normal range of motion and full passive range of motion without pain. Neck supple. No JVD present. Carotid bruit is not present. No thyromegaly present.  Cardiovascular: Normal rate, regular rhythm and normal heart sounds.  Exam reveals no gallop and no friction rub.   No murmur heard. Pulmonary/Chest: Effort normal and breath sounds normal. She has no wheezes. She has no rales.  Abdominal: Soft. Bowel sounds are  normal. She exhibits no distension and no mass. There is no tenderness. There is no rebound and no guarding.  Musculoskeletal:       Right shoulder: Normal.       Left shoulder: Normal.       Cervical back: Normal.  Lymphadenopathy:    She has no cervical adenopathy.  Neurological: She is alert and oriented to person, place, and time. She has normal reflexes. No cranial nerve deficit. She exhibits normal muscle tone. Coordination normal.  Skin: Skin is warm and dry. No rash noted. She is not diaphoretic. No erythema. No pallor.  Psychiatric: She has a normal mood and affect. Her behavior is normal. Judgment and thought content normal.  Nursing note and vitals reviewed.  Results for orders placed or performed during the hospital encounter of 10/13/15  hCG, serum, qualitative  Result Value Ref Range     Preg, Serum NEGATIVE NEGATIVE       Assessment & Plan:  No diagnosis found.  No orders of the defined types were placed in this encounter.   No orders of the defined types were placed in this encounter.    No Follow-up on file.    Glenwood Revoir Elayne Guerin, M.D. [facility] [address] [place], [place]  [address] [phone] phone [phone] fax

## 2015-11-19 LAB — COMPREHENSIVE METABOLIC PANEL
ALK PHOS: 74 U/L (ref 33–115)
ALT: 21 U/L (ref 6–29)
AST: 13 U/L (ref 10–30)
Albumin: 4.2 g/dL (ref 3.6–5.1)
BILIRUBIN TOTAL: 0.3 mg/dL (ref 0.2–1.2)
BUN: 16 mg/dL (ref 7–25)
CO2: 27 mmol/L (ref 20–31)
Calcium: 9.3 mg/dL (ref 8.6–10.2)
Chloride: 103 mmol/L (ref 98–110)
Creat: 0.69 mg/dL (ref 0.50–1.10)
GLUCOSE: 95 mg/dL (ref 65–99)
POTASSIUM: 3.9 mmol/L (ref 3.5–5.3)
Sodium: 139 mmol/L (ref 135–146)
TOTAL PROTEIN: 7.1 g/dL (ref 6.1–8.1)

## 2015-11-19 LAB — CBC WITH DIFFERENTIAL/PLATELET
BASOS ABS: 0 10*3/uL (ref 0.0–0.1)
Basophils Relative: 0 % (ref 0–1)
Eosinophils Absolute: 0.1 10*3/uL (ref 0.0–0.7)
Eosinophils Relative: 1 % (ref 0–5)
HEMATOCRIT: 42.7 % (ref 36.0–46.0)
HEMOGLOBIN: 14.1 g/dL (ref 12.0–15.0)
LYMPHS ABS: 3.4 10*3/uL (ref 0.7–4.0)
LYMPHS PCT: 28 % (ref 12–46)
MCH: 28.4 pg (ref 26.0–34.0)
MCHC: 33 g/dL (ref 30.0–36.0)
MCV: 85.9 fL (ref 78.0–100.0)
MONOS PCT: 9 % (ref 3–12)
MPV: 9.6 fL (ref 8.6–12.4)
Monocytes Absolute: 1.1 10*3/uL — ABNORMAL HIGH (ref 0.1–1.0)
NEUTROS ABS: 7.6 10*3/uL (ref 1.7–7.7)
NEUTROS PCT: 62 % (ref 43–77)
PLATELETS: 407 10*3/uL — AB (ref 150–400)
RBC: 4.97 MIL/uL (ref 3.87–5.11)
RDW: 13.5 % (ref 11.5–15.5)
WBC: 12.3 10*3/uL — ABNORMAL HIGH (ref 4.0–10.5)

## 2015-11-19 LAB — TSH: TSH: 0.077 u[IU]/mL — AB (ref 0.350–4.500)

## 2015-11-19 LAB — T4, FREE: Free T4: 1.39 ng/dL (ref 0.80–1.80)

## 2015-11-19 LAB — MICROALBUMIN, URINE: Microalb, Ur: 1.1 mg/dL

## 2015-11-21 ENCOUNTER — Telehealth: Payer: Self-pay

## 2015-11-21 NOTE — Telephone Encounter (Signed)
PA is needed for diclofenac gel. Started on covermymeds. Dr Tamala Julian, one of the questions on the form asks,  "Is the requested medication being prescribed for treatment of pain due to osteoarthritis involving the elbow, wrist, hand, knee, ankle and/or foot?" I see that the pt has a Dx of cubital tunnel syndrome, and assume that is what is causing her pain? I wasn't sure if I can answer yes to that Question (in which case the PA will be considered). The other question I am having trouble with (sorry, your OV notes aren't complete yet) is "Will the patient be taking an oral NSAID (includes COX-2 inhibitors) to treat the same condition as the requested medication?". I see that mobic is still on pt's med list on AVS at end of OV, so assume she will continue to take it? The PA will probably be denied, if so. Please advise.

## 2015-11-22 NOTE — Telephone Encounter (Signed)
Completed covermymeds w/info from Dr Tamala Julian. Pending. It doesn't look like it will be covered if pain is not from OA, but will wait for decision.

## 2015-11-22 NOTE — Telephone Encounter (Signed)
Meloxicam is for PRN use only.  She will be taking diclofenac gel for elbow tendonitis NOT osteoarthritis.  She will NOT be taking Meloxicam while taking Diclofenac.

## 2015-11-24 NOTE — Telephone Encounter (Signed)
PA was denied because it is only covered if the elbow pain is caused by OA. Notified pharm. Dr Tamala Julian, do you want to Rx anything else?

## 2015-11-25 NOTE — Telephone Encounter (Signed)
No.   Call patient and advise that insurance will not cover the topical medication for her elbow.  Please have her take Meloxicam that she has for elbow pain.  If she does not have any Meloxicam at home, OK to refill.

## 2015-11-29 MED ORDER — MELOXICAM 15 MG PO TABS: 15.0000 mg | ORAL_TABLET | Freq: Every day | ORAL | Status: DC

## 2015-11-29 NOTE — Telephone Encounter (Signed)
Sent RFs of meloxicam. Please call pt and advise of ins denial for diclofenac gel and Dr Thompson Caul instr's below.

## 2015-11-29 NOTE — Telephone Encounter (Signed)
lmom that rx was sent in

## 2015-12-05 ENCOUNTER — Encounter: Payer: Self-pay | Admitting: Family Medicine

## 2015-12-22 ENCOUNTER — Ambulatory Visit (INDEPENDENT_AMBULATORY_CARE_PROVIDER_SITE_OTHER): Payer: BLUE CROSS/BLUE SHIELD | Admitting: Family Medicine

## 2015-12-22 VITALS — BP 102/68 | HR 70 | Temp 98.3°F | Resp 16 | Ht 62.0 in | Wt 273.0 lb

## 2015-12-22 DIAGNOSIS — B379 Candidiasis, unspecified: Secondary | ICD-10-CM | POA: Diagnosis not present

## 2015-12-22 DIAGNOSIS — J111 Influenza due to unidentified influenza virus with other respiratory manifestations: Secondary | ICD-10-CM

## 2015-12-22 DIAGNOSIS — E1165 Type 2 diabetes mellitus with hyperglycemia: Secondary | ICD-10-CM | POA: Diagnosis not present

## 2015-12-22 DIAGNOSIS — IMO0001 Reserved for inherently not codable concepts without codable children: Secondary | ICD-10-CM

## 2015-12-22 MED ORDER — BENZONATATE 100 MG PO CAPS: 100.0000 mg | ORAL_CAPSULE | Freq: Three times a day (TID) | ORAL | Status: DC | PRN

## 2015-12-22 MED ORDER — NYSTATIN 100000 UNIT/GM EX CREA: 1.0000 "application " | TOPICAL_CREAM | Freq: Two times a day (BID) | CUTANEOUS | Status: DC

## 2015-12-22 MED ORDER — AZELASTINE HCL 0.1 % NA SOLN: 2.0000 | Freq: Two times a day (BID) | NASAL | Status: DC

## 2015-12-22 MED ORDER — MUCINEX DM 30-600 MG PO TB12: 1.0000 | ORAL_TABLET | Freq: Two times a day (BID) | ORAL | Status: DC

## 2015-12-22 NOTE — Progress Notes (Signed)
Subjective:    Patient ID: [name], female    DOB: Feb 05, 1976, 40 y.o.   MRN: [id number]  12/22/2015  Cough; Eye Problem; and Other   HPI This 40 y.o. female presents for evaluation of cough, laryngitis. Onset one week ago.  +HA.  Malaise and fatigue.  +sweats for four days.  Coughing is horrible all day long.  No ear pain. +ST.  +PND.  +vomited forced; +nausea.  +rhinorrhea R.  +nasal congestion.  +SOB mild with exertion; mostly fatigued.  No diarrhea.  Sugars are stable.  +body aches.    Review of Systems Per HPI   Past Medical History  Diagnosis Date  . H. pylori infection     dx  09-02-2014-- treated w/ antibiotic  . GERD (gastroesophageal reflux disease)   . Type 2 diabetes mellitus ([place])   . Carpal tunnel syndrome, bilateral   . PCOS (polycystic ovarian syndrome)   . Menorrhagia   . Irregular menstrual cycle   . Hypertension   . Depression    Past Surgical History  Procedure Laterality Date  . Cesarean section  1995  . Carpal tunnel release Bilateral 10/07/2014    Procedure: CARPAL TUNNEL RELEASE BILATERAL;  Surgeon: [name], MD;  Location: [facility];  Service: Orthopedics;  Laterality: Bilateral;   Allergies  Allergen Reactions  . 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 Shrimp [Shellfish Allergy] Anaphylaxis    Social History   Social History  . Marital Status: Married    Spouse Name: N/A  . Number of Children: 2  . Years of Education: N/A   Occupational History  . Not on file.   Social History Main Topics  . Smoking status: Never Smoker   . Smokeless tobacco: Never Used  . Alcohol Use: 0.0 oz/week    0 Standard drinks or equivalent per week     Comment: socially  . Drug Use: No  . Sexual Activity:    Partners: Male    Birth Control/ Protection: None   Other Topics Concern  . Not on file   Social History Narrative   Marital status: married x 1 year; from [place].  Moved to [place] since 2000.       Children:  2  children (22, 22); one grandchildren (5)      Lives:  With husband      Employment:  [employer] full time;       Tobacco: none      Alcohol: social      Drugs: none      Exercise: none   No reported caffeine use    Family History  Problem Relation Age of Onset  . Heart disease Mother 72    CAD  . Diabetes Mother   . Hypertension Mother   . Cancer Father 41    prostate cancer  . Diabetes Father   . Thyroid disease Neg Hx        Objective:    BP 102/68 mmHg  Pulse 70  Temp(Src) 98.3 F (36.8 C)  Resp 16  Ht 5\' 2"  (1.575 m)  Wt 273 lb (123.832 kg)  BMI 49.92 kg/m2  SpO2 99%  LMP 11/21/2015 (Approximate) Physical Exam  Constitutional: She is oriented to person, place, and time. She appears well-developed and well-nourished. No distress.  HENT:  Head: Normocephalic and atraumatic.  Right Ear: External ear normal.  Left Ear: External ear normal.  Nose: Nose normal.  Mouth/Throat: Oropharynx is clear and moist.  Eyes: Conjunctivae  and EOM are normal. Pupils are equal, round, and reactive to light.  Neck: Normal range of motion. Neck supple. Carotid bruit is not present. No thyromegaly present.  Cardiovascular: Normal rate, regular rhythm, normal heart sounds and intact distal pulses.  Exam reveals no gallop and no friction rub.   No murmur heard. Pulmonary/Chest: Effort normal and breath sounds normal. She has no wheezes. She has no rales.  Abdominal: Soft. Bowel sounds are normal. She exhibits no distension and no mass. There is no tenderness. There is no rebound and no guarding.  Lymphadenopathy:    She has no cervical adenopathy.  Neurological: She is alert and oriented to person, place, and time. No cranial nerve deficit.  Skin: Skin is warm and dry. No rash noted. She is not diaphoretic. No erythema. No pallor.  Psychiatric: She has a normal mood and affect. Her behavior is normal.   Results for orders placed or performed in visit on 11/18/15  Microalbumin, urine    Result Value Ref Range   Microalb, Ur 1.1 Not estab mg/dL  TSH  Result Value Ref Range   TSH 0.077 (L) 0.350 - 4.500 uIU/mL  T4, free  Result Value Ref Range   Free T4 1.39 0.80 - 1.80 ng/dL  CBC with Differential/Platelet  Result Value Ref Range   WBC 12.3 (H) 4.0 - 10.5 K/uL   RBC 4.97 3.87 - 5.11 MIL/uL   Hemoglobin 14.1 12.0 - 15.0 g/dL   HCT 42.7 36.0 - 46.0 %   MCV 85.9 78.0 - 100.0 fL   MCH 28.4 26.0 - 34.0 pg   MCHC 33.0 30.0 - 36.0 g/dL   RDW 13.5 11.5 - 15.5 %   Platelets 407 (H) 150 - 400 K/uL   MPV 9.6 8.6 - 12.4 fL   Neutrophils Relative % 62 43 - 77 %   Neutro Abs 7.6 1.7 - 7.7 K/uL   Lymphocytes Relative 28 12 - 46 %   Lymphs Abs 3.4 0.7 - 4.0 K/uL   Monocytes Relative 9 3 - 12 %   Monocytes Absolute 1.1 (H) 0.1 - 1.0 K/uL   Eosinophils Relative 1 0 - 5 %   Eosinophils Absolute 0.1 0.0 - 0.7 K/uL   Basophils Relative 0 0 - 1 %   Basophils Absolute 0.0 0.0 - 0.1 K/uL   Smear Review SEE NOTE   Comprehensive metabolic panel  Result Value Ref Range   Sodium 139 135 - 146 mmol/L   Potassium 3.9 3.5 - 5.3 mmol/L   Chloride 103 98 - 110 mmol/L   CO2 27 20 - 31 mmol/L   Glucose, Bld 95 65 - 99 mg/dL   BUN 16 7 - 25 mg/dL   Creat 0.69 0.50 - 1.10 mg/dL   Total Bilirubin 0.3 0.2 - 1.2 mg/dL   Alkaline Phosphatase 74 33 - 115 U/L   AST 13 10 - 30 U/L   ALT 21 6 - 29 U/L   Total Protein 7.1 6.1 - 8.1 g/dL   Albumin 4.2 3.6 - 5.1 g/dL   Calcium 9.3 8.6 - 10.2 mg/dL       Assessment & Plan:   1. Influenza   2. Candidiasis   3. Uncontrolled type 2 diabetes mellitus without complication, without long-term current use of insulin ([place])     No orders of the defined types were placed in this encounter.   Meds ordered this encounter  Medications  . Dextromethorphan-Guaifenesin (MUCINEX DM) 30-600 MG TB12    Sig: Take  1 tablet by mouth 2 (two) times daily.    Dispense:  28 each    Refill:  0  . azelastine (ASTELIN) 0.1 % nasal spray    Sig: Place 2 sprays  into both nostrils 2 (two) times daily. Use in each nostril as directed    Dispense:  30 mL    Refill:  0  . nystatin cream (MYCOSTATIN)    Sig: Apply 1 application topically 2 (two) times daily.    Dispense:  30 g    Refill:  0  . benzonatate (TESSALON) 100 MG capsule    Sig: Take 1-2 capsules (100-200 mg total) by mouth 3 (three) times daily as needed for cough.    Dispense:  40 capsule    Refill:  0    No Follow-up on file.    Thornton Dohrmann Elayne Guerin, M.D. [facility] [address] [place], [place]  [address] [phone] phone [phone] fax

## 2015-12-22 NOTE — Patient Instructions (Signed)
1. MUCINEX DM TABLETS  --- ONE TABLET TWICE DAILY FOR COUGH. 2.  TESSALON PERLES  --- 2 CAPSULES THREE TIMES DAILY FOR COUGH. 3. CONTINUE FLUTICASONE 2 SPRAYS INTO EACH NOSTRIL ONCE DAILY. 4.  ASTELIN NASAL SPRAY - 2 SPRAYS INTO EACH NOSTRIL TWICE DAILY. 5.  NYSTATIN CREAM --- APPLY TO ABDOMEN TWICE DAILY.

## 2016-01-29 ENCOUNTER — Ambulatory Visit (INDEPENDENT_AMBULATORY_CARE_PROVIDER_SITE_OTHER): Payer: BLUE CROSS/BLUE SHIELD

## 2016-01-29 ENCOUNTER — Ambulatory Visit (INDEPENDENT_AMBULATORY_CARE_PROVIDER_SITE_OTHER): Payer: BLUE CROSS/BLUE SHIELD | Admitting: Family Medicine

## 2016-01-29 VITALS — BP 112/80 | HR 81 | Temp 98.0°F | Resp 16 | Ht 62.0 in | Wt 288.2 lb

## 2016-01-29 DIAGNOSIS — K5900 Constipation, unspecified: Secondary | ICD-10-CM | POA: Diagnosis not present

## 2016-01-29 DIAGNOSIS — Z923 Personal history of irradiation: Secondary | ICD-10-CM

## 2016-01-29 DIAGNOSIS — R1084 Generalized abdominal pain: Secondary | ICD-10-CM

## 2016-01-29 DIAGNOSIS — E119 Type 2 diabetes mellitus without complications: Secondary | ICD-10-CM

## 2016-01-29 LAB — POCT CBC
GRANULOCYTE PERCENT: 84.4 % — AB (ref 37–80)
HCT, POC: 38.2 % (ref 37.7–47.9)
Hemoglobin: 13.4 g/dL (ref 12.2–16.2)
Lymph, poc: 1.8 (ref 0.6–3.4)
MCH: 29.3 pg (ref 27–31.2)
MCHC: 35 g/dL (ref 31.8–35.4)
MCV: 83.8 fL (ref 80–97)
MID (CBC): 0.2 (ref 0–0.9)
MPV: 7.6 fL (ref 0–99.8)
POC Granulocyte: 10.8 — AB (ref 2–6.9)
POC LYMPH %: 14.1 % (ref 10–50)
POC MID %: 1.5 % (ref 0–12)
Platelet Count, POC: 354 10*3/uL (ref 142–424)
RBC: 4.56 M/uL (ref 4.04–5.48)
RDW, POC: 13.2 %
WBC: 12.8 10*3/uL — AB (ref 4.6–10.2)

## 2016-01-29 LAB — POCT URINE PREGNANCY: Preg Test, Ur: NEGATIVE

## 2016-01-29 LAB — COMPLETE METABOLIC PANEL WITH GFR
ALBUMIN: 3.5 g/dL — AB (ref 3.6–5.1)
ALK PHOS: 72 U/L (ref 33–115)
ALT: 17 U/L (ref 6–29)
AST: 12 U/L (ref 10–30)
BUN: 13 mg/dL (ref 7–25)
CALCIUM: 8.5 mg/dL — AB (ref 8.6–10.2)
CO2: 28 mmol/L (ref 20–31)
CREATININE: 0.73 mg/dL (ref 0.50–1.10)
Chloride: 102 mmol/L (ref 98–110)
GFR, Est African American: >89 mL/min (ref >=60)
GFR, Est Non African American: >89 mL/min (ref >=60)
Glucose, Bld: 167 mg/dL — ABNORMAL HIGH (ref 65–99)
POTASSIUM: 4 mmol/L (ref 3.5–5.3)
Sodium: 137 mmol/L (ref 135–146)
Total Bilirubin: 0.3 mg/dL (ref 0.2–1.2)
Total Protein: 6.2 g/dL (ref 6.1–8.1)

## 2016-01-29 LAB — THYROID PANEL WITH TSH
Free Thyroxine Index: 2 (ref 1.4–3.8)
T3 Uptake: 24 % (ref 22–35)
T4 TOTAL: 8.5 ug/dL (ref 4.5–12.0)
TSH: 0.2 mIU/L — ABNORMAL LOW

## 2016-01-29 LAB — GLUCOSE, POCT (MANUAL RESULT ENTRY): POC GLUCOSE: 175 mg/dL — AB (ref 70–99)

## 2016-01-29 LAB — IFOBT (OCCULT BLOOD): IFOBT: NEGATIVE

## 2016-01-29 LAB — POCT GLYCOSYLATED HEMOGLOBIN (HGB A1C): Hemoglobin A1C: 6.6

## 2016-01-29 NOTE — Patient Instructions (Addendum)
Use some over-the-counter glycerin suppositories  Take Dulcolax 4 pills today.  Drink MiraLAX 1 dose daily  Drink plenty of fluids  It may be necessary to use your finger when you're on the toilet and try and help dig out some of the stool.  If the glycerin suppository does not help, try giving yourself a fleets enema. These can be bought over-the-counter. Squeeze the liquid up into your rectum, try to hold it in as long as you can, and then sit on the toilet and expel it.  In the event of acute severe abdominal pain go to the emergency room  Continue to take your diabetes and blood pressure medicines faithfully  After eating your meals try and take a walk which can help your bowels move better.  Estreimiento - Adultos (Constipation, Adult) Estreimiento significa que una persona tiene menos de tres evacuaciones en una semana, dificultad para defecar, o que las heces son secas, duras, o ms grandes que lo normal. A medida que envejecemos el estreimiento es ms comn. Una dieta baja en fibra, no tomar suficientes lquidos y el uso de ciertos medicamentos pueden Agricultural engineer.  CAUSAS   Ciertos medicamentos, como los antidepresivos, analgsicos, suplementos de hierro, anticidos y diurticos.  Algunas enfermedades, como la diabetes, el sndrome del colon irritable, enfermedad de la tiroides, o depresin.  No beber suficiente agua.  No consumir suficientes alimentos ricos en fibra.  Situaciones de estrs o viajes.  Falta de actividad fsica o de ejercicio.  Ignorar la necesidad sbita de Landscape architect.  Uso en exceso de laxantes. SIGNOS Y SNTOMAS   Defecar menos de tres veces por semana.  Dificultad para defecar.  Tener las heces secas y duras, o ms grandes que las normales.  Sensacin de estar lleno o hinchado.  Dolor en la parte baja del abdomen.  No sentir alivio despus de defecar. DIAGNSTICO  El mdico le har una historia clnica y un examen fsico.  Pueden hacerle exmenes adicionales para el estreimiento grave. Estos estudios pueden ser:  Un radiografa con enema de bario para examinar el recto, el colon y, en algunos casos, el intestino delgado.  Una sigmoidoscopia para examinar el colon inferior.  Una colonoscopia para examinar todo el colon. TRATAMIENTO  El tratamiento depender de la gravedad del estreimiento y de la causa. Algunos tratamientos nutricionales son beber ms lquidos y comer ms alimentos ricos en fibra. El cambio en el estilo de vida incluye hacer ejercicios de [place] regular. Si estas recomendaciones para Animator dieta y en el estilo de vida no ayudan, el mdico le puede indicar el uso de laxantes de venta libre para ayudarlo a Landscape architect. Los medicamentos recetados se pueden prescribir si los medicamentos de venta libre no lo [place].  INSTRUCCIONES PARA EL CUIDADO EN EL HOGAR   Consuma alimentos con alto contenido de [place], como frutas, vegetales, cereales integrales y porotos.  Limite los alimentos procesados ricos en grasas y azcar, como las papas fritas, hamburguesas, galletas, dulces y refrescos.  Puede agregar un suplemento de fibra a su dieta si no obtiene lo suficiente de los alimentos.  Beba suficiente lquido para Consulting civil engineer orina clara o de color amarillo plido.  Haga ejercicio regularmente o segn las indicaciones del mdico.  Vaya al bao cuando sienta la necesidad de ir. No se aguante las ganas.  Tome solo medicamentos de venta libre o recetados, segn las indicaciones del mdico. No tome otros medicamentos para el estreimiento sin consultarlo antes con su mdico. SOLICITE ATENCIN  [place] DE INMEDIATO SI:   Observa sangre brillante en las heces.  El estreimiento dura ms de 4 das o [place].  Siente dolor abdominal o rectal.  Las heces son delgadas como un lpiz.  Pierde peso de [place] inexplicable. ASEGRESE DE QUE:   Comprende estas instrucciones.  Controlar su  afeccin.  Recibir ayuda de inmediato si no mejora o si empeora.   Esta informacin no tiene Marine scientist el consejo del mdico. Asegrese de hacerle al mdico cualquier pregunta que tenga.   Document Released: 11/04/2007 Document Revised: 11/05/2014 Elsevier Interactive Patient Education [employer].     IF you received an x-ray today, you will receive an invoice from [facility]. Please contact [facility] at [phone] with questions or concerns regarding your invoice.   IF you received labwork today, you will receive an invoice from [employer]. Please contact [facility] at [phone] with questions or concerns regarding your invoice.   Our billing staff will not be able to assist you with questions regarding bills from these companies.  You will be contacted with the lab results as soon as they are available. The fastest way to get your results is to activate your My Chart account. Instructions are located on the last page of this paperwork. If you have not heard from [place] regarding the results in 2 weeks, please contact this office.

## 2016-01-29 NOTE — Progress Notes (Signed)
Patient ID: [name], female    DOB: Jul 07, 1976  Age: 40 y.o. MRN: [id number]  Chief Complaint  Patient presents with  . Abdominal Pain  . unable to have bowel movement    Subjective:   40 year old lady who is here with history of having had a adequate bowel movement on Friday. Yesterday she was unable to expel her BM, try to several times. Only passed a couple of tiny amounts, and again this morning a tiny hard piece. She feels like it is right there at the anus but will come out. She does not have a history of constipation, used to go regularly every morning. She has started having generalized abdominal pain, but especially across both lower sides of the abdomen. It cramps up and then it lets up.  Her menses are not real regular but she denies pregnancy. Uses protection.  She had a radioactive thyroid ablation done in December. Has not been on any thyroid replacement.  She is diabetic, was out of her medicines but a new prescription for several things is at the pharmacy. She has hypertension, and I believe it was primarily the blood pressure medicines around. She sees Dr. Sonia Baller, has an appointment in April she thinks..  Current allergies, medications, problem list, past/family and social histories reviewed.  Objective:  BP 112/80 mmHg  Pulse 81  Temp(Src) 98 F (36.7 C) (Oral)  Resp 16  Ht 5\' 2"  (1.575 m)  Wt 288 lb 3.2 oz (130.727 kg)  BMI 52.70 kg/m2  SpO2 97%  LMP 12/22/2015  Pleasant lady, morbidly obese, no CVA tenderness. Abdomen has bowel sounds present. Soft. Generally tender across the lower abdomen mostly. No rebound.On digital rectal exam she does have a lot of erythema all around her anus from the irritation. This may well cause the humerus sure that we did to be positive, but he was sure was done. No blockage could be palpated. I think she has evacuated right at the anus, because I could not feel the bulge of stool that she could said was there previously  before she moved several small amounts.  Assessment & Plan:   Assessment: 1. Generalized abdominal pain   2. Constipation, unspecified constipation type   3. H/O radioactive iodine thyroid ablation   4. Morbid obesity, unspecified obesity type ([place])   5. Type 2 diabetes mellitus without complication, without long-term current use of insulin ([place])       Plan: With her thyroid history that could be related to the constipation also. Will check thyroid functions. Will check the diabetic status currently also before treating her. Check a urine hCG and get a x-ray of the abdomen.  Orders Placed This Encounter  Procedures  . DG Abd 2 Views    Order Specific Question:  Reason for Exam (SYMPTOM  OR DIAGNOSIS REQUIRED)    Answer:  Abdominal pain, constipation    Order Specific Question:  Is the patient pregnant?    Answer:  No     Comments:  Patient denies; hCG will be ordered    Order Specific Question:  Preferred imaging location?    Answer:  External  . COMPLETE METABOLIC PANEL WITH GFR  . Thyroid Panel With TSH  . POCT glucose (manual entry)  . POCT glycosylated hemoglobin (Hb A1C)  . POCT urine pregnancy  . POCT CBC  . IFOBT POC (occult bld, rslt in office)    No orders of the defined types were placed in this encounter.   Results for  orders placed or performed in visit on 01/29/16  POCT glucose (manual entry)  Result Value Ref Range   POC Glucose 175 (A) 70 - 99 mg/dl  POCT glycosylated hemoglobin (Hb A1C)  Result Value Ref Range   Hemoglobin A1C 6.6   POCT urine pregnancy  Result Value Ref Range   Preg Test, Ur Negative Negative  POCT CBC  Result Value Ref Range   WBC 12.8 (A) 4.6 - 10.2 K/uL   Lymph, poc 1.8 0.6 - 3.4   POC LYMPH PERCENT 14.1 10 - 50 %L   MID (cbc) 0.2 0 - 0.9   POC MID % 1.5 0 - 12 %M   POC Granulocyte 10.8 (A) 2 - 6.9   Granulocyte percent 84.4 (A) 37 - 80 %G   RBC 4.56 4.04 - 5.48 M/uL   Hemoglobin 13.4 12.2 - 16.2 g/dL   HCT, POC 38.2 37.7  - 47.9 %   MCV 83.8 80 - 97 fL   MCH, POC 29.3 27 - 31.2 pg   MCHC 35.0 31.8 - 35.4 g/dL   RDW, POC 13.2 %   Platelet Count, POC 354 142 - 424 K/uL   MPV 7.6 0 - 99.8 fL          Patient Instructions   Use some over-the-counter glycerin suppositories  Take Dulcolax 4 pills today.  Drink MiraLAX 1 dose daily  Drink plenty of fluids  It may be necessary to use your finger when you're on the toilet and try and help dig out some of the stool.  If the glycerin suppository does not help, try giving yourself a fleets enema. These can be bought over-the-counter. Squeeze the liquid up into your rectum, try to hold it in as long as you can, and then sit on the toilet and expel it.  In the event of acute severe abdominal pain go to the emergency room  Continue to take your diabetes and blood pressure medicines faithfully  After eating your meals try and take a walk which can help your bowels move better.  Estreimiento - Adultos (Constipation, Adult) Estreimiento significa que una persona tiene menos de tres evacuaciones en una semana, dificultad para defecar, o que las heces son secas, duras, o ms grandes que lo normal. A medida que envejecemos el estreimiento es ms comn. Una dieta baja en fibra, no tomar suficientes lquidos y el uso de ciertos medicamentos pueden Agricultural engineer.  CAUSAS   Ciertos medicamentos, como los antidepresivos, analgsicos, suplementos de hierro, anticidos y diurticos.  Algunas enfermedades, como la diabetes, el sndrome del colon irritable, enfermedad de la tiroides, o depresin.  No beber suficiente agua.  No consumir suficientes alimentos ricos en fibra.  Situaciones de estrs o viajes.  Falta de actividad fsica o de ejercicio.  Ignorar la necesidad sbita de Landscape architect.  Uso en exceso de laxantes. SIGNOS Y SNTOMAS   Defecar menos de tres veces por semana.  Dificultad para defecar.  Tener las heces secas y duras, o ms  grandes que las normales.  Sensacin de estar lleno o hinchado.  Dolor en la parte baja del abdomen.  No sentir alivio despus de defecar. DIAGNSTICO  El mdico le har una historia clnica y un examen fsico. Pueden hacerle exmenes adicionales para el estreimiento grave. Estos estudios pueden ser:  Un radiografa con enema de bario para examinar el recto, el colon y, en algunos casos, el intestino delgado.  Una sigmoidoscopia para examinar el colon inferior.  Una colonoscopia para examinar  todo el colon. TRATAMIENTO  El tratamiento depender de la gravedad del estreimiento y de la causa. Algunos tratamientos nutricionales son beber ms lquidos y comer ms alimentos ricos en fibra. El cambio en el estilo de vida incluye hacer ejercicios de [place] regular. Si estas recomendaciones para Animator dieta y en el estilo de vida no ayudan, el mdico le puede indicar el uso de laxantes de venta libre para ayudarlo a Landscape architect. Los medicamentos recetados se pueden prescribir si los medicamentos de venta libre no lo [place].  INSTRUCCIONES PARA EL CUIDADO EN EL HOGAR   Consuma alimentos con alto contenido de [place], como frutas, vegetales, cereales integrales y porotos.  Limite los alimentos procesados ricos en grasas y azcar, como las papas fritas, hamburguesas, galletas, dulces y refrescos.  Puede agregar un suplemento de fibra a su dieta si no obtiene lo suficiente de los alimentos.  Beba suficiente lquido para Consulting civil engineer orina clara o de color amarillo plido.  Haga ejercicio regularmente o segn las indicaciones del mdico.  Vaya al bao cuando sienta la necesidad de ir. No se aguante las ganas.  Tome solo medicamentos de venta libre o recetados, segn las indicaciones del mdico. No tome otros medicamentos para el estreimiento sin consultarlo antes con su mdico. SOLICITE ATENCIN MDICA DE INMEDIATO SI:   Observa sangre brillante en las heces.  El estreimiento  dura ms de 4 das o [place].  Siente dolor abdominal o rectal.  Las heces son delgadas como un lpiz.  Pierde peso de [place] inexplicable. ASEGRESE DE QUE:   Comprende estas instrucciones.  Controlar su afeccin.  Recibir ayuda de inmediato si no mejora o si empeora.   Esta informacin no tiene Marine scientist el consejo del mdico. Asegrese de hacerle al mdico cualquier pregunta que tenga.   Document Released: 11/04/2007 Document Revised: 11/05/2014 Elsevier Interactive Patient Education [employer].     IF you received an x-ray today, you will receive an invoice from [facility]. Please contact [facility] at [phone] with questions or concerns regarding your invoice.   IF you received labwork today, you will receive an invoice from [employer]. Please contact [facility] at [phone] with questions or concerns regarding your invoice.   Our billing staff will not be able to assist you with questions regarding bills from these companies.  You will be contacted with the lab results as soon as they are available. The fastest way to get your results is to activate your My Chart account. Instructions are located on the last page of this paperwork. If you have not heard from [place] regarding the results in 2 weeks, please contact this office.          Return if symptoms worsen or fail to improve.   HOPPER,DAVID, MD 01/29/2016

## 2016-02-07 ENCOUNTER — Other Ambulatory Visit: Payer: Self-pay | Admitting: Physician Assistant

## 2016-02-07 ENCOUNTER — Other Ambulatory Visit: Payer: Self-pay | Admitting: Family Medicine

## 2016-02-28 ENCOUNTER — Ambulatory Visit (INDEPENDENT_AMBULATORY_CARE_PROVIDER_SITE_OTHER): Payer: BLUE CROSS/BLUE SHIELD | Admitting: Family Medicine

## 2016-02-28 ENCOUNTER — Encounter: Payer: Self-pay | Admitting: Family Medicine

## 2016-02-28 VITALS — BP 120/70 | HR 74 | Temp 98.4°F | Resp 163 | Ht 62.0 in | Wt 279.8 lb

## 2016-02-28 DIAGNOSIS — I10 Essential (primary) hypertension: Secondary | ICD-10-CM

## 2016-02-28 DIAGNOSIS — J301 Allergic rhinitis due to pollen: Secondary | ICD-10-CM | POA: Diagnosis not present

## 2016-02-28 DIAGNOSIS — E1165 Type 2 diabetes mellitus with hyperglycemia: Secondary | ICD-10-CM | POA: Diagnosis not present

## 2016-02-28 DIAGNOSIS — F419 Anxiety disorder, unspecified: Secondary | ICD-10-CM

## 2016-02-28 DIAGNOSIS — J309 Allergic rhinitis, unspecified: Secondary | ICD-10-CM | POA: Diagnosis not present

## 2016-02-28 DIAGNOSIS — E0590 Thyrotoxicosis, unspecified without thyrotoxic crisis or storm: Secondary | ICD-10-CM

## 2016-02-28 DIAGNOSIS — F418 Other specified anxiety disorders: Secondary | ICD-10-CM

## 2016-02-28 DIAGNOSIS — IMO0001 Reserved for inherently not codable concepts without codable children: Secondary | ICD-10-CM

## 2016-02-28 DIAGNOSIS — F32A Depression, unspecified: Secondary | ICD-10-CM

## 2016-02-28 DIAGNOSIS — F329 Major depressive disorder, single episode, unspecified: Secondary | ICD-10-CM

## 2016-02-28 MED ORDER — FLUTICASONE PROPIONATE 50 MCG/ACT NA SUSP: 2.0000 | Freq: Every day | NASAL | Status: DC

## 2016-02-28 MED ORDER — LORATADINE 10 MG PO TABS: 10.0000 mg | ORAL_TABLET | Freq: Every day | ORAL | Status: DC

## 2016-02-28 MED ORDER — MELOXICAM 15 MG PO TABS: 15.0000 mg | ORAL_TABLET | Freq: Every day | ORAL | Status: DC

## 2016-02-28 NOTE — Progress Notes (Signed)
Subjective:    Patient ID: [name], female    DOB: 01-03-1976, 40 y.o.   MRN: [id number]  02/28/2016  Follow-up and Medication Refill   HPI This 40 y.o. female presents for three month follow-up:   1. DMII: Patient reports good compliance with medication, good tolerance to medication, and good symptom control.  Invokana 100mg  daily/onglyza/metformin/glimeperide.   2.  HTN: Patient reports good compliance with medication, good tolerance to medication, and good symptom control.  Metoprolol, HCTZ, Lisinopril.    3.  Hyperthyroidism: s/p ablation.  Renato Shin is endocrinologist.     Review of Systems  Constitutional: Negative for fever, chills, diaphoresis and fatigue.  Eyes: Negative for visual disturbance.  Respiratory: Negative for cough and shortness of breath.   Cardiovascular: Negative for chest pain, palpitations and leg swelling.  Gastrointestinal: Negative for nausea, vomiting, abdominal pain, diarrhea and constipation.  Endocrine: Negative for cold intolerance, heat intolerance, polydipsia, polyphagia and polyuria.  Neurological: Negative for dizziness, tremors, seizures, syncope, facial asymmetry, speech difficulty, weakness, light-headedness, numbness and headaches.    Past Medical History  Diagnosis Date  . H. pylori infection     dx  09-02-2014-- treated w/ antibiotic  . GERD (gastroesophageal reflux disease)   . Type 2 diabetes mellitus ([place])   . Carpal tunnel syndrome, bilateral   . PCOS (polycystic ovarian syndrome)   . Menorrhagia   . Irregular menstrual cycle   . Hypertension   . Depression    Past Surgical History  Procedure Laterality Date  . Cesarean section  1995  . Carpal tunnel release Bilateral 10/07/2014    Procedure: CARPAL TUNNEL RELEASE BILATERAL;  Surgeon: [name], MD;  Location: [facility];  Service: Orthopedics;  Laterality: Bilateral;   Allergies  Allergen Reactions  . Peach [Prunus Persica] Anaphylaxis    . Shrimp [Shellfish Allergy] Anaphylaxis    Social History   Social History  . Marital Status: Married    Spouse Name: N/A  . Number of Children: 2  . Years of Education: N/A   Occupational History  . Not on file.   Social History Main Topics  . Smoking status: Never Smoker   . Smokeless tobacco: Never Used  . Alcohol Use: 0.0 oz/week    0 Standard drinks or equivalent per week     Comment: socially  . Drug Use: No  . Sexual Activity:    Partners: Male    Birth Control/ Protection: None   Other Topics Concern  . Not on file   Social History Narrative   Marital status: married x 1 year; from [place].  Moved to [place] since 2000.       Children:  2 children (22, 22); one grandchildren (5)      Lives:  With husband      Employment:  [employer] full time;       Tobacco: none      Alcohol: social      Drugs: none      Exercise: none   No reported caffeine use    Family History  Problem Relation Age of Onset  . Heart disease Mother 77    CAD  . Diabetes Mother   . Hypertension Mother   . Cancer Father 7    prostate cancer  . Diabetes Father   . Thyroid disease Neg Hx        Objective:    BP 120/70 mmHg  Pulse 74  Temp(Src) 98.4 F (36.9 C) (Oral)  Resp 163  Ht 5\' 2"  (1.575 m)  Wt 279 lb 12.8 oz (126.916 kg)  BMI 51.16 kg/m2  SpO2 98%  LMP 01/29/2016 Physical Exam  Constitutional: She is oriented to person, place, and time. She appears well-developed and well-nourished. No distress.  HENT:  Head: Normocephalic and atraumatic.  Right Ear: External ear normal.  Left Ear: External ear normal.  Nose: Nose normal.  Mouth/Throat: Oropharynx is clear and moist.  Eyes: Conjunctivae and EOM are normal. Pupils are equal, round, and reactive to light.  Neck: Normal range of motion. Neck supple. Carotid bruit is not present. No thyromegaly present.  Cardiovascular: Normal rate, regular rhythm, normal heart sounds and intact distal pulses.  Exam reveals no gallop and  no friction rub.   No murmur heard. Pulmonary/Chest: Effort normal and breath sounds normal. She has no wheezes. She has no rales.  Abdominal: Soft. Bowel sounds are normal. She exhibits no distension and no mass. There is no tenderness. There is no rebound and no guarding.  Lymphadenopathy:    She has no cervical adenopathy.  Neurological: She is alert and oriented to person, place, and time. No cranial nerve deficit.  Skin: Skin is warm and dry. No rash noted. She is not diaphoretic. No erythema. No pallor.  Psychiatric: She has a normal mood and affect. Her behavior is normal.   Results for orders placed or performed in visit on 01/29/16  COMPLETE METABOLIC PANEL WITH GFR  Result Value Ref Range   Sodium 137 135 - 146 mmol/L   Potassium 4.0 3.5 - 5.3 mmol/L   Chloride 102 98 - 110 mmol/L   CO2 28 20 - 31 mmol/L   Glucose, Bld 167 (H) 65 - 99 mg/dL   BUN 13 7 - 25 mg/dL   Creat 0.73 0.50 - 1.10 mg/dL   Total Bilirubin 0.3 0.2 - 1.2 mg/dL   Alkaline Phosphatase 72 33 - 115 U/L   AST 12 10 - 30 U/L   ALT 17 6 - 29 U/L   Total Protein 6.2 6.1 - 8.1 g/dL   Albumin 3.5 (L) 3.6 - 5.1 g/dL   Calcium 8.5 (L) 8.6 - 10.2 mg/dL   GFR, Est African American >89 >=60 mL/min   GFR, Est Non African American >89 >=60 mL/min  Thyroid Panel With TSH  Result Value Ref Range   T4, Total 8.5 4.5 - 12.0 ug/dL   T3 Uptake 24 22 - 35 %   Free Thyroxine Index 2.0 1.4 - 3.8   TSH 0.20 (L) mIU/L  POCT glucose (manual entry)  Result Value Ref Range   POC Glucose 175 (A) 70 - 99 mg/dl  POCT glycosylated hemoglobin (Hb A1C)  Result Value Ref Range   Hemoglobin A1C 6.6   POCT urine pregnancy  Result Value Ref Range   Preg Test, Ur Negative Negative  POCT CBC  Result Value Ref Range   WBC 12.8 (A) 4.6 - 10.2 K/uL   Lymph, poc 1.8 0.6 - 3.4   POC LYMPH PERCENT 14.1 10 - 50 %L   MID (cbc) 0.2 0 - 0.9   POC MID % 1.5 0 - 12 %M   POC Granulocyte 10.8 (A) 2 - 6.9   Granulocyte percent 84.4 (A) 37 -  80 %G   RBC 4.56 4.04 - 5.48 M/uL   Hemoglobin 13.4 12.2 - 16.2 g/dL   HCT, POC 38.2 37.7 - 47.9 %   MCV 83.8 80 - 97 fL   MCH, POC 29.3 27 -  31.2 pg   MCHC 35.0 31.8 - 35.4 g/dL   RDW, POC 13.2 %   Platelet Count, POC 354 142 - 424 K/uL   MPV 7.6 0 - 99.8 fL  IFOBT POC (occult bld, rslt in office)  Result Value Ref Range   IFOBT Negative        Assessment & Plan:   1. Allergic rhinitis, unspecified allergic rhinitis type   2. Hyperthyroidism   3. Essential hypertension, benign   4. Anxiety and depression   5. Seasonal allergic rhinitis due to pollen   6. Uncontrolled type 2 diabetes mellitus without complication, without long-term current use of insulin ([place])     Orders Placed This Encounter  Procedures  . Ambulatory referral to Endocrinology    Referral Priority:  Routine    Referral Type:  Consultation    Referral Reason:  Specialty Services Required    Number of Visits Requested:  1   Meds ordered this encounter  Medications  . fluticasone (FLONASE) 50 MCG/ACT nasal spray    Sig: Place 2 sprays into both nostrils daily.    Dispense:  16 g    Refill:  12  . loratadine (CLARITIN) 10 MG tablet    Sig: Take 1 tablet (10 mg total) by mouth daily.    Dispense:  90 tablet    Refill:  3  . DISCONTD: meloxicam (MOBIC) 15 MG tablet    Sig: Take 1 tablet (15 mg total) by mouth daily.    Dispense:  90 tablet    Refill:  0    Return in about 4 months (around 06/30/2016) for recheck.    Kanyon Seibold Elayne Guerin, M.D. [facility] [address] [place], [place]  [address] [phone] phone [phone] fax

## 2016-02-28 NOTE — Patient Instructions (Addendum)
  CALL SEAN ELLISON, MD'S OFFICE FOR AN APPOINTMENT.   IF you received an x-ray today, you will receive an invoice from [facility]. Please contact [facility] at [phone] with questions or concerns regarding your invoice.   IF you received labwork today, you will receive an invoice from [employer]. Please contact [facility] at [phone] with questions or concerns regarding your invoice.   Our billing staff will not be able to assist you with questions regarding bills from these companies.  You will be contacted with the lab results as soon as they are available. The fastest way to get your results is to activate your My Chart account. Instructions are located on the last page of this paperwork. If you have not heard from [place] regarding the results in 2 weeks, please contact this office.

## 2016-03-12 ENCOUNTER — Ambulatory Visit (INDEPENDENT_AMBULATORY_CARE_PROVIDER_SITE_OTHER): Payer: BLUE CROSS/BLUE SHIELD | Admitting: Endocrinology

## 2016-03-12 VITALS — BP 122/64 | HR 80 | Temp 98.5°F | Wt 284.0 lb

## 2016-03-12 DIAGNOSIS — E0590 Thyrotoxicosis, unspecified without thyrotoxic crisis or storm: Secondary | ICD-10-CM | POA: Diagnosis not present

## 2016-03-12 LAB — TSH: TSH: 3.86 u[IU]/mL (ref 0.35–4.50)

## 2016-03-12 LAB — T4, FREE: Free T4: 0.61 ng/dL (ref 0.60–1.60)

## 2016-03-12 NOTE — Patient Instructions (Signed)
blood tests are requested for you today.  We'll let you know about the results.   Please come back for a follow-up appointment in 2 months.  

## 2016-03-12 NOTE — Progress Notes (Signed)
Subjective:    Patient ID: [name], female    DOB: 02/29/1976, 40 y.o.   MRN: [id number]  HPI Pt returns for f/u of hyperthyroidism (due to [place]; dx'ed in early 2016 (TFT were normal in late 2015); she had RAI in Dec of 2016). pt states she feels no different, and well in general. Past Medical History  Diagnosis Date  . H. pylori infection     dx  09-02-2014-- treated w/ antibiotic  . GERD (gastroesophageal reflux disease)   . Type 2 diabetes mellitus ([place])   . Carpal tunnel syndrome, bilateral   . PCOS (polycystic ovarian syndrome)   . Menorrhagia   . Irregular menstrual cycle   . Hypertension   . Depression     Past Surgical History  Procedure Laterality Date  . Cesarean section  1995  . Carpal tunnel release Bilateral 10/07/2014    Procedure: CARPAL TUNNEL RELEASE BILATERAL;  Surgeon: [name], MD;  [facility];  Service: Orthopedics;  Laterality: Bilateral;    Social History   Social History  . Marital Status: Married    Spouse Name: N/A  . Number of Children: 2  . Years of Education: N/A   Occupational History  . Not on file.   Social History Main Topics  . Smoking status: Never Smoker   . Smokeless tobacco: Never Used  . Alcohol Use: 0.0 oz/week    0 Standard drinks or equivalent per week     Comment: socially  . Drug Use: No  . Sexual Activity:    Partners: Male    Birth Control/ Protection: None   Other Topics Concern  . Not on file   Social History Narrative   Marital status: married x 1 year; from [place].  Moved to [place] since 2000.       Children:  2 children (22, 22); one grandchildren (5)      Lives:  With husband      Employment:  [employer] full time;       Tobacco: none      Alcohol: social      Drugs: none      Exercise: none   No reported caffeine use     Current Outpatient Prescriptions on File Prior to Visit  Medication Sig Dispense Refill  . canagliflozin (INVOKANA) 100 MG TABS tablet Take  1 tablet (100 mg total) by mouth daily. 30 tablet 5  . clindamycin (CLINDAGEL) 1 % gel Apply topically 2 (two) times daily. 30 g 0  . clobetasol cream (TEMOVATE) [id number] % Apply 1 application topically 2 (two) times daily. x10d 60 g 0  . EPINEPHrine (EPIPEN 2-PAK) 0.3 mg/0.3 mL IJ SOAJ injection Inject 0.3 mLs (0.3 mg total) into the muscle once. 1 Device 2  . FLUoxetine (PROZAC) 20 MG tablet Take 2 tablets (40 mg total) by mouth daily. 60 tablet 5  . fluticasone (FLONASE) 50 MCG/ACT nasal spray Place 2 sprays into both nostrils daily. 16 g 12  . glimepiride (AMARYL) 4 MG tablet Take 2 tabs (8 mg) po twice daily. 120 tablet 3  . hydrochlorothiazide (HYDRODIURIL) 25 MG tablet Take 1 tablet (25 mg total) by mouth daily. 90 tablet 3  . lisinopril (PRINIVIL,ZESTRIL) 10 MG tablet Take 1 tablet (10 mg total) by mouth daily. 90 tablet 3  . loratadine (CLARITIN) 10 MG tablet Take 1 tablet (10 mg total) by mouth daily. 90 tablet 3  . medroxyPROGESTERone (PROVERA) 10 MG tablet Take one trablet  daily for 10 days of the month if no spontaneous menses every 30 days IF negative home UPT 30 tablet 4  . meloxicam (MOBIC) 15 MG tablet Take 1 tablet (15 mg total) by mouth daily. 90 tablet 0  . metFORMIN (GLUCOPHAGE) 500 MG tablet Take 2 tabs by mouth (1000 mg ) 2 times daily. 120 tablet 11  . metoprolol succinate (TOPROL-XL) 25 MG 24 hr tablet Take 1 tablet (25 mg total) by mouth daily. 90 tablet 3  . nystatin cream (MYCOSTATIN) Apply 1 application topically 2 (two) times daily. 30 g 0  . saxagliptin HCl (ONGLYZA) 5 MG TABS tablet Take 1 tablet (5 mg total) by mouth daily. 30 tablet 11  . valACYclovir (VALTREX) 1000 MG tablet Take 1 tablet (1,000 mg total) by mouth 2 (two) times daily. 14 tablet 2   No current facility-administered medications on file prior to visit.    Allergies  Allergen Reactions  . 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 Shrimp [Shellfish Allergy] Anaphylaxis    Family History  Problem  Relation Age of Onset  . Heart disease Mother 95    CAD  . Diabetes Mother   . Hypertension Mother   . Cancer Father 36    prostate cancer  . Diabetes Father   . Thyroid disease Neg Hx     BP 122/64 mmHg  Pulse 80  Temp(Src) 98.5 F (36.9 C) (Oral)  Wt 284 lb (128.822 kg)  SpO2 95%  LMP 01/29/2016  Review of Systems She has gained a few lbs    Objective:   Physical Exam VITAL SIGNS:  See vs page GENERAL: no distress NECK: There is no palpable thyroid enlargement.  No thyroid nodule is palpable.  No palpable lymphadenopathy at the anterior neck.  Lab Results  Component Value Date   TSH 3.86 03/12/2016   T4TOTAL 8.5 01/29/2016      Assessment & Plan:  Hyperthyroidism: resolved with RAI, but she may next develop hypothyroidism  Patient is advised the following: Patient Instructions  blood tests are requested for you today.  We'll let you know about the results. Please come back for a follow-up appointment in 2 months.

## 2016-03-21 ENCOUNTER — Other Ambulatory Visit: Payer: Self-pay | Admitting: Family Medicine

## 2016-03-21 NOTE — Telephone Encounter (Signed)
clobetasol cream (TEMOVATE) 0.05 %   Pharmacy: [facility], [place]   [phone] (H)

## 2016-03-23 NOTE — Telephone Encounter (Signed)
Ok to fill Temovate.  Last time prescribed 6 months ago?

## 2016-04-10 ENCOUNTER — Ambulatory Visit (INDEPENDENT_AMBULATORY_CARE_PROVIDER_SITE_OTHER): Payer: BLUE CROSS/BLUE SHIELD | Admitting: Family Medicine

## 2016-04-10 ENCOUNTER — Ambulatory Visit (INDEPENDENT_AMBULATORY_CARE_PROVIDER_SITE_OTHER): Payer: BLUE CROSS/BLUE SHIELD

## 2016-04-10 VITALS — BP 124/70 | HR 74 | Temp 98.1°F | Resp 18 | Ht 62.0 in | Wt 286.0 lb

## 2016-04-10 DIAGNOSIS — R21 Rash and other nonspecific skin eruption: Secondary | ICD-10-CM | POA: Diagnosis not present

## 2016-04-10 DIAGNOSIS — M5441 Lumbago with sciatica, right side: Secondary | ICD-10-CM | POA: Diagnosis not present

## 2016-04-10 DIAGNOSIS — S3992XA Unspecified injury of lower back, initial encounter: Secondary | ICD-10-CM | POA: Diagnosis not present

## 2016-04-10 DIAGNOSIS — B009 Herpesviral infection, unspecified: Secondary | ICD-10-CM

## 2016-04-10 MED ORDER — CYCLOBENZAPRINE HCL 5 MG PO TABS: ORAL_TABLET | ORAL | Status: DC

## 2016-04-10 MED ORDER — VALACYCLOVIR HCL 500 MG PO TABS: 500.0000 mg | ORAL_TABLET | Freq: Two times a day (BID) | ORAL | Status: DC

## 2016-04-10 NOTE — Progress Notes (Addendum)
By signing my name below I, Tereasa Coop, attest that this documentation has been prepared under the direction and in the presence of Wendie Agreste, MD. Electonically Signed. Tereasa Coop, Scribe 04/10/2016 at 6:28 PM   Subjective:    Patient ID: [name], female    DOB: 04/12/1976, 40 y.o.   MRN: [id number]  Chief Complaint  Patient presents with  . Back Pain    something on her back/patient also fell  . Leg Pain    right  leg burning    HPI [name] is a 40 y.o. female who presents to the [facility] complaining of low back pain for the past 3 days. Pt also reports a burning pain in her rt calf. Pt states the pain was so severe after she finished work yesterday she almost went to ED. Pt states pain improved after rest and taking an old muscle relaxer yesterday. Pt denies any urinary or bowel incontinence, leg weakness, or saddle anesthesia. Pt has also taken meloxicam for the pain with no relief. Pt also reports some rt leg numbness and tingling.  Pt fell 2 weeks ago and did not have any pain at that time.   Pt also c/o painful boil in rt inner buttocks that started 2 days ago and has been worsening. Pt has history of genital herpes.   Pt has history of DM.  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     Patient Active Problem List   Diagnosis Date Noted  . Hyperthyroidism 07/28/2015  . Essential hypertension, benign 06/29/2015  . Allergic rhinitis due to pollen 06/29/2015  . Anxiety and depression 06/29/2015  . Gastroesophageal reflux disease without esophagitis 06/29/2015  . OSA on CPAP 06/29/2015  . History of syphilis 05/23/2015  . Amenorrhea 05/23/2015  . Severe obesity (BMI >= 40) ([place]) 09/14/2014  . Irregular menstrual cycle 11/12/2013  . Type II diabetes mellitus, uncontrolled ([place]) 06/20/2009  . POLYCYSTIC OVARIAN DISEASE 06/20/2009  . CARPAL TUNNEL SYNDROME 06/20/2009   Past Medical History  Diagnosis Date  . H.  pylori infection     dx  09-02-2014-- treated w/ antibiotic  . GERD (gastroesophageal reflux disease)   . Type 2 diabetes mellitus ([place])   . Carpal tunnel syndrome, bilateral   . PCOS (polycystic ovarian syndrome)   . Menorrhagia   . Irregular menstrual cycle   . Hypertension   . Depression    Past Surgical History  Procedure Laterality Date  . Cesarean section  1995  . Carpal tunnel release Bilateral 10/07/2014    Procedure: CARPAL TUNNEL RELEASE BILATERAL;  Surgeon: [name], MD;  Location: [facility];  Service: Orthopedics;  Laterality: Bilateral;   Allergies  Allergen Reactions  . 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 Shrimp [Shellfish Allergy] Anaphylaxis   Prior to Admission medications   Medication Sig Start Date End Date Taking? Authorizing Provider  canagliflozin (INVOKANA) 100 MG TABS tablet Take 1 tablet (100 mg total) by mouth daily. 11/01/15  Yes Ezekiel Slocumb, PA-C  clindamycin (CLINDAGEL) 1 % gel Apply topically 2 (two) times daily. 09/14/15  Yes Wardell Honour, MD  clobetasol cream (TEMOVATE) [id number] % APPLY ONE APPLICATION TOPICALLY TWO TIMES DAILY FOR 10 DAYS 03/24/16  Yes Wardell Honour, MD  EPINEPHrine (EPIPEN 2-PAK) 0.3 mg/0.3 mL IJ SOAJ injection Inject 0.3 mLs (0.3 mg total) into the muscle once. 05/09/15  Yes Orpah Greek, MD  FLUoxetine (PROZAC) 20 MG tablet  Take 2 tablets (40 mg total) by mouth daily. 11/01/15  Yes Ezekiel Slocumb, PA-C  fluticasone (FLONASE) 50 MCG/ACT nasal spray Place 2 sprays into both nostrils daily. 02/28/16  Yes Wardell Honour, MD  glimepiride (AMARYL) 4 MG tablet Take 2 tabs (8 mg) po twice daily. 06/11/15  Yes Wardell Honour, MD  hydrochlorothiazide (HYDRODIURIL) 25 MG tablet Take 1 tablet (25 mg total) by mouth daily. 03/23/15  Yes Wardell Honour, MD  lisinopril (PRINIVIL,ZESTRIL) 10 MG tablet Take 1 tablet (10 mg total) by mouth daily. 11/01/15  Yes Ezekiel Slocumb, PA-C  loratadine (CLARITIN) 10 MG tablet Take 1  tablet (10 mg total) by mouth daily. 02/28/16  Yes Wardell Honour, MD  medroxyPROGESTERone (PROVERA) 10 MG tablet Take one trablet daily for 10 days of the month if no spontaneous menses every 30 days IF negative home UPT 05/23/15  Yes Terrance Mass, MD  meloxicam (MOBIC) 15 MG tablet Take 1 tablet (15 mg total) by mouth daily. 02/28/16  Yes Wardell Honour, MD  metFORMIN (GLUCOPHAGE) 500 MG tablet Take 2 tabs by mouth (1000 mg ) 2 times daily. 06/29/15  Yes Wardell Honour, MD  metoprolol succinate (TOPROL-XL) 25 MG 24 hr tablet Take 1 tablet (25 mg total) by mouth daily. 08/12/15  Yes Wardell Honour, MD  nystatin cream (MYCOSTATIN) Apply 1 application topically 2 (two) times daily. 12/22/15  Yes Wardell Honour, MD  saxagliptin HCl (ONGLYZA) 5 MG TABS tablet Take 1 tablet (5 mg total) by mouth daily. 03/31/15  Yes Wardell Honour, MD  valACYclovir (VALTREX) 1000 MG tablet Take 1 tablet (1,000 mg total) by mouth 2 (two) times daily. 05/25/15  Yes Terrance Mass, MD   Social History   Social History  . Marital Status: Married    Spouse Name: N/A  . Number of Children: 2  . Years of Education: N/A   Occupational History  . Not on file.   Social History Main Topics  . Smoking status: Never Smoker   . Smokeless tobacco: Never Used  . Alcohol Use: 0.0 oz/week    0 Standard drinks or equivalent per week     Comment: socially  . Drug Use: No  . Sexual Activity:    Partners: Male    Birth Control/ Protection: None   Other Topics Concern  . Not on file   Social History Narrative   Marital status: married x 1 year; from [place].  Moved to [place] since 2000.       Children:  2 children (22, 22); one grandchildren (5)      Lives:  With husband      Employment:  [employer] full time;       Tobacco: none      Alcohol: social      Drugs: none      Exercise: none   No reported caffeine use       Review of Systems  Constitutional: Negative for fever.  Musculoskeletal: Positive for myalgias (rt leg  pain) and back pain.  Skin: Positive for rash (rt buttock).  Neurological: Positive for numbness (rt leg). Negative for weakness.       Objective:   Physical Exam  Constitutional: She is oriented to person, place, and time. She appears well-developed and well-nourished. No distress.  Pt is obese.   HENT:  Head: Normocephalic and atraumatic.  Eyes: Conjunctivae are normal. Pupils are equal, round, and reactive to light.  Neck: Neck supple.  Cardiovascular: Normal rate.   Pulmonary/Chest: Effort normal.  Musculoskeletal: Normal range of motion.  Tender rt sciatic notch.   Neurological: She is alert and oriented to person, place, and time. No sensory deficit. She displays a negative Romberg sign. Gait normal. She displays no Babinski's sign on the right side. She displays no Babinski's sign on the left side.  Reflex Scores:      Patellar reflexes are 2+ on the right side and 2+ on the left side.      Achilles reflexes are 2+ on the right side and 2+ on the left side. Pt has negative straight leg raise bilat.   Skin: Skin is warm and dry.  Pt has a patch of vesicles with 1.5 cm of underlying erythema just rt of her intergluteal cleft. Pt also has a few scattered papules without surrounding erythema on her lower legs bilat.   Psychiatric: She has a normal mood and affect. Her behavior is normal.  Nursing note and vitals reviewed.    Filed Vitals:   04/10/16 1645  BP: 124/70  Pulse: 74  Temp: 98.1 F (36.7 C)  TempSrc: Oral  Resp: 18  Height: 5\' 2"  (1.575 m)  Weight: 286 lb (129.729 kg)  SpO2: 97%    Dg Lumbar Spine 2-3 Views  04/10/2016  CLINICAL DATA:  Fall 2 weeks ago, but no initial back pain. Now with right-sided sciatica for 3 days. EXAM: LUMBAR SPINE - 2-3 VIEW COMPARISON:  11/11/2013 FINDINGS: No fracture.  No spondylolisthesis. 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 Disc spaces are relatively well preserved. There are minor endplate osteophytes from the mid to  lower lumbar spine. Soft tissues are unremarkable. IMPRESSION: 1. No fracture or acute finding. 2. Minimal mid to lower lumbar spine disc degenerative change. Transitional lumbosacral vertebra. Electronically Signed   By: Lajean Manes M.D.   On: 04/10/2016 18:24        Assessment & Plan:   [name] is a 40 y.o. female Right-sided low back pain with right-sided sciatica - Plan: DG Lumbar Spine 2-3 Views  - Right low back pain, sciatica. No concerning findings on exam. Minimal DDD on x-ray. Previous injury, but no back pain immediately after injury.  -Continue meloxicam, heat, gentle range of motion, stretches as needed. Handout given on sciatica.  -Prescription for Flexeril every 8 hours as needed.As discussed.  -RTC precautions, and recheck if persists past this weekend. Consider prednisone, but this would also increase glucose, and has underlying diabetes.  Herpes infection - Plan: valACYclovir (VALTREX) 500 MG tablet  - Suspected herpes outbreak on upper buttocks. Start Valtrex 500 mg twice a day for 3 days. , RTC precautions.  Rash and nonspecific skin eruption  - Scattered small erythematous papules and lower extremity. Appear to be fairly mild, may be small amount of folliculitis versus insect bite. If increase in the number or spreading, return for recheck. Minimal symptoms at present.   Meds ordered this encounter  Medications  . valACYclovir (VALTREX) 500 MG tablet    Sig: Take 1 tablet (500 mg total) by mouth 2 (two) times daily.    Dispense:  6 tablet    Refill:  2  . cyclobenzaprine (FLEXERIL) 5 MG tablet    Sig: 1 pill by mouth up to every 8 hours as needed. Start with one pill by mouth each bedtime as needed due to sedation    Dispense:  15 tablet    Refill:  0   Patient Instructions  IF you received an x-ray today, you will receive an invoice from [facility]. Please contact [facility] at [phone] with questions or concerns  regarding your invoice.   IF you received labwork today, you will receive an invoice from [employer]. Please contact [facility] at [phone] with questions or concerns regarding your invoice.   Our billing staff will not be able to assist you with questions regarding bills from these companies.  You will be contacted with the lab results as soon as they are available. The fastest way to get your results is to activate your My Chart account. Instructions are located on the last page of this paperwork. If you have not heard from [place] regarding the results in 2 weeks, please contact this office.    The rash on her upper buttocks does appear to be due to to a outbreak of herpes. Start the Valtrex 1 pill twice per day for the next 3 days. If the redness or swelling increases, or rashes present other areas, return for recheck.  Your buttock/back pain into the leg appears to be from sciatica. This can be due to a pinched nerve or spasm of the muscle. See information below. Continue meloxicam 1 pill once per day. Tylenol over-the-counter if needed, but do not use other over-the-counter pain relievers. Flexeril up to every 8 hours as needed, but start at bedtime as it does cause sedation. Heat as needed and range of motion throughout the day. If not improving in next 4-5 days, may need to try prednisone, but this could increase your blood sugars. Return in the next 5 days if not improving, sooner if worse.  The red bumps in your leg may be irritated hair follicles, or possible insect bites. If these increase or spread, return for recheck.   Genital Herpes Genital herpes is a common sexually transmitted infection (STI) that is caused by a virus. The virus is spread from person to person through sexual contact. Infection can cause itching, blisters, and sores in the genital area or rectal area. This is called an outbreak. It affects both men and women. Genital herpes is particularly  concerning for pregnant women because the virus can be passed to the baby during delivery and cause serious problems. Genital herpes is also a concern for people with a weakened defense (immune) system. Symptoms of genital herpes may last several days and then go away. However, the virus remains in your body, so you may have more outbreaks of symptoms in the future. The time between outbreaks varies and can be months or years. CAUSES Genital herpes is caused by a virus called herpes simplex virus (HSV) type 2 or HSV type 1. These viruses are contagious and are most often spread through sexual contact with an infected person. Sexual contact includes vaginal, anal, and oral sex. RISK FACTORS Risk factors for genital herpes include:  Being sexually active with multiple partners.  Having unprotected sex. SIGNS AND SYMPTOMS Symptoms may include:  Pain and itching in the genital area or rectal area.  Small red bumps that turn into blisters and then turn into sores.  Flu-like symptoms, including:  Fever.  Body aches.  Painful urination.  Vaginal discharge. DIAGNOSIS Genital herpes may be diagnosed by:  Physical exam.  Blood test.  Fluid culture test from an open sore. TREATMENT There is no cure for genital herpes. Oral antiviral medicines may be used to speed up healing and to help prevent the return of symptoms. These medicines can also  help to reduce the spread of the virus to sexual partners. HOME CARE INSTRUCTIONS  Keep the affected areas dry and clean.  Take medicines only as directed by your health care provider.  Do not have sexual contact during active infections. Genital herpes is contagious.  Practice safe sex. Latex condoms and female condoms may help to prevent the spread of the herpes virus.  Avoid rubbing or touching the blisters and sores. If you do touch the blister or sores:  Wash your hands thoroughly.  Do not touch your eyes afterward.  If you become  pregnant, tell your health care provider if you have had genital herpes.  Keep all follow-up visits as directed by your health care provider. This is important. PREVENTION  Use condoms. Although anyone can contract genital herpes during sexual contact even with the use of a condom, a condom can provide some protection.  Avoid having multiple sexual partners.  Talk to your sexual partner about any symptoms and past history that either of you may have.  Get tested before you have sex. Ask your partner to do the same.  Recognize the symptoms of genital herpes. Do not have sexual contact if you notice these symptoms. SEEK MEDICAL CARE IF:  Your symptoms are not improving with medicine.  Your symptoms return.  You have new symptoms.  You have a fever.  You have abdominal pain.  You have redness, swelling, or pain in your eye. MAKE SURE YOU:  Understand these instructions.  Will watch your condition.  Will get help right away if you are not doing well or get worse.   This information is not intended to replace advice given to you by your health care provider. Make sure you discuss any questions you have with your health care provider.   Document Released: 10/12/2000 Document Revised: 11/05/2014 Document Reviewed: 03/02/2014 Elsevier Interactive Patient Education 2016 [place] The sciatic nerve runs from the back down the leg and is responsible for sensation and control of the muscles in the back (posterior) side of the thigh, lower leg, and foot. Sciatica is a condition that is characterized by inflammation of this nerve.  SYMPTOMS   Signs of nerve damage, including numbness and/or weakness along the posterior side of the lower extremity.  Pain in the back of the thigh that may also travel down the leg.  Pain that worsens when sitting for long periods of time.  Occasionally, pain in the back or buttock. CAUSES  Inflammation of the sciatic nerve  is the cause of sciatica. The inflammation is due to something irritating the nerve. Common sources of irritation include:  Sitting for long periods of time.  Direct trauma to the nerve.  Arthritis of the spine.  Herniated or ruptured disk.  Slipping of the vertebrae (spondylolisthesis).  Pressure from soft tissues, such as muscles or ligament-like tissue (fascia). RISK INCREASES WITH:  Sports that place pressure or stress on the spine (football or weightlifting).  Poor strength and flexibility.  Failure to warm up properly before activity.  Family history of low back pain or disk disorders.  Previous back injury or surgery.  Poor body mechanics, especially when lifting, or poor posture. PREVENTION   Warm up and stretch properly before activity.  Maintain physical fitness:  Strength, flexibility, and endurance.  Cardiovascular fitness.  Learn and use proper technique, especially with posture and lifting. When possible, have coach correct improper technique.  Avoid activities that place stress on the spine. PROGNOSIS If  treated properly, then sciatica usually resolves within 6 weeks. However, occasionally surgery is necessary.  RELATED COMPLICATIONS   Permanent nerve damage, including pain, numbness, tingle, or weakness.  Chronic back pain.  Risks of surgery: infection, bleeding, nerve damage, or damage to surrounding tissues. TREATMENT Treatment initially involves resting from any activities that aggravate your symptoms. The use of ice and medication may help reduce pain and inflammation. The use of strengthening and stretching exercises may help reduce pain with activity. These exercises may be performed at home or with referral to a therapist. A therapist may recommend further treatments, such as transcutaneous electronic nerve stimulation (TENS) or ultrasound. Your caregiver may recommend corticosteroid injections to help reduce inflammation of the sciatic nerve.  If symptoms persist despite non-surgical (conservative) treatment, then surgery may be recommended. MEDICATION  If pain medication is necessary, then nonsteroidal anti-inflammatory medications, such as aspirin and ibuprofen, or other minor pain relievers, such as acetaminophen, are often recommended.  Do not take pain medication for 7 days before surgery.  Prescription pain relievers may be given if deemed necessary by your caregiver. Use only as directed and only as much as you need.  Ointments applied to the skin may be helpful.  Corticosteroid injections may be given by your caregiver. These injections should be reserved for the most serious cases, because they may only be given a certain number of times. HEAT AND COLD  Cold treatment (icing) relieves pain and reduces inflammation. Cold treatment should be applied for 10 to 15 minutes every 2 to 3 hours for inflammation and pain and immediately after any activity that aggravates your symptoms. Use ice packs or massage the area with a piece of ice (ice massage).  Heat treatment may be used prior to performing the stretching and strengthening activities prescribed by your caregiver, physical therapist, or athletic trainer. Use a heat pack or soak the injury in warm water. SEEK MEDICAL CARE IF:  Treatment seems to offer no benefit, or the condition worsens.  Any medications produce adverse side effects. EXERCISES  RANGE OF MOTION (ROM) AND STRETCHING EXERCISES - Sciatica Most people with sciatic will find that their symptoms worsen with either excessive bending forward (flexion) or arching at the low back (extension). The exercises which will help resolve your symptoms will focus on the opposite motion. Your physician, physical therapist or athletic trainer will help you determine which exercises will be most helpful to resolve your low back pain. Do not complete any exercises without first consulting with your clinician. Discontinue any  exercises which worsen your symptoms until you speak to your clinician. If you have pain, numbness or tingling which travels down into your buttocks, leg or foot, the goal of the therapy is for these symptoms to move closer to your back and eventually resolve. Occasionally, these leg symptoms will get better, but your low back pain may worsen; this is typically an indication of progress in your rehabilitation. Be certain to be very alert to any changes in your symptoms and the activities in which you participated in the 24 hours prior to the change. Sharing this information with your clinician will allow him/her to most efficiently treat your condition. These exercises may help you when beginning to rehabilitate your injury. Your symptoms may resolve with or without further involvement from your physician, physical therapist or athletic trainer. While completing these exercises, remember:   Restoring tissue flexibility helps normal motion to return to the joints. This allows healthier, less painful movement and activity.  An  effective stretch should be held for at least 30 seconds.  A stretch should never be painful. You should only feel a gentle lengthening or release in the stretched tissue. FLEXION RANGE OF MOTION AND STRETCHING EXERCISES: STRETCH - Flexion, Single Knee to Chest   Lie on a firm bed or floor with both legs extended in front of you.  Keeping one leg in contact with the floor, bring your opposite knee to your chest. Hold your leg in place by either grabbing behind your thigh or at your knee.  Pull until you feel a gentle stretch in your low back. Hold __________ seconds.  Slowly release your grasp and repeat the exercise with the opposite side. Repeat __________ times. Complete this exercise __________ times per day.  STRETCH - Flexion, Double Knee to Chest  Lie on a firm bed or floor with both legs extended in front of you.  Keeping one leg in contact with the floor, bring  your opposite knee to your chest.  Tense your stomach muscles to support your back and then lift your other knee to your chest. Hold your legs in place by either grabbing behind your thighs or at your knees.  Pull both knees toward your chest until you feel a gentle stretch in your low back. Hold __________ seconds.  Tense your stomach muscles and slowly return one leg at a time to the floor. Repeat __________ times. Complete this exercise __________ times per day.  STRETCH - Low Trunk Rotation   Lie on a firm bed or floor. Keeping your legs in front of you, bend your knees so they are both pointed toward the ceiling and your feet are flat on the floor.  Extend your arms out to the side. This will stabilize your upper body by keeping your shoulders in contact with the floor.  Gently and slowly drop both knees together to one side until you feel a gentle stretch in your low back. Hold for __________ seconds.  Tense your stomach muscles to support your low back as you bring your knees back to the starting position. Repeat the exercise to the other side. Repeat __________ times. Complete this exercise __________ times per day  EXTENSION RANGE OF MOTION AND FLEXIBILITY EXERCISES: STRETCH - Extension, Prone on Elbows  Lie on your stomach on the floor, a bed will be too soft. Place your palms about shoulder width apart and at the height of your head.  Place your elbows under your shoulders. If this is too painful, stack pillows under your chest.  Allow your body to relax so that your hips drop lower and make contact more completely with the floor.  Hold this position for __________ seconds.  Slowly return to lying flat on the floor. Repeat __________ times. Complete this exercise __________ times per day.  RANGE OF MOTION - Extension, Prone Press Ups  Lie on your stomach on the floor, a bed will be too soft. Place your palms about shoulder width apart and at the height of your  head.  Keeping your back as relaxed as possible, slowly straighten your elbows while keeping your hips on the floor. You may adjust the placement of your hands to maximize your comfort. As you gain motion, your hands will come more underneath your shoulders.  Hold this position __________ seconds.  Slowly return to lying flat on the floor. Repeat __________ times. Complete this exercise __________ times per day.  STRENGTHENING EXERCISES - Sciatica  These exercises may help you when beginning  to rehabilitate your injury. These exercises should be done near your "sweet spot." This is the neutral, low-back arch, somewhere between fully rounded and fully arched, that is your least painful position. When performed in this safe range of motion, these exercises can be used for people who have either a flexion or extension based injury. These exercises may resolve your symptoms with or without further involvement from your physician, physical therapist or athletic trainer. While completing these exercises, remember:   Muscles can gain both the endurance and the strength needed for everyday activities through controlled exercises.  Complete these exercises as instructed by your physician, physical therapist or athletic trainer. Progress with the resistance and repetition exercises only as your caregiver advises.  You may experience muscle soreness or fatigue, but the pain or discomfort you are trying to eliminate should never worsen during these exercises. If this pain does worsen, stop and make certain you are following the directions exactly. If the pain is still present after adjustments, discontinue the exercise until you can discuss the trouble with your clinician. STRENGTHENING - Deep Abdominals, Pelvic Tilt   Lie on a firm bed or floor. Keeping your legs in front of you, bend your knees so they are both pointed toward the ceiling and your feet are flat on the floor.  Tense your lower abdominal  muscles to press your low back into the floor. This motion will rotate your pelvis so that your tail bone is scooping upwards rather than pointing at your feet or into the floor.  With a gentle tension and even breathing, hold this position for __________ seconds. Repeat __________ times. Complete this exercise __________ times per day.  STRENGTHENING - Abdominals, Crunches   Lie on a firm bed or floor. Keeping your legs in front of you, bend your knees so they are both pointed toward the ceiling and your feet are flat on the floor. Cross your arms over your chest.  Slightly tip your chin down without bending your neck.  Tense your abdominals and slowly lift your trunk high enough to just clear your shoulder blades. Lifting higher can put excessive stress on the low back and does not further strengthen your abdominal muscles.  Control your return to the starting position. Repeat __________ times. Complete this exercise __________ times per day.  STRENGTHENING - Quadruped, Opposite UE/LE Lift  Assume a hands and knees position on a firm surface. Keep your hands under your shoulders and your knees under your hips. You may place padding under your knees for comfort.  Find your neutral spine and gently tense your abdominal muscles so that you can maintain this position. Your shoulders and hips should form a rectangle that is parallel with the floor and is not twisted.  Keeping your trunk steady, lift your right hand no higher than your shoulder and then your left leg no higher than your hip. Make sure you are not holding your breath. Hold this position __________ seconds.  Continuing to keep your abdominal muscles tense and your back steady, slowly return to your starting position. Repeat with the opposite arm and leg. Repeat __________ times. Complete this exercise __________ times per day.  STRENGTHENING - Abdominals and Quadriceps, Straight Leg Raise   Lie on a firm bed or floor with both  legs extended in front of you.  Keeping one leg in contact with the floor, bend the other knee so that your foot can rest flat on the floor.  Find your neutral spine, and tense  your abdominal muscles to maintain your spinal position throughout the exercise.  Slowly lift your straight leg off the floor about 6 inches for a count of 15, making sure to not hold your breath.  Still keeping your neutral spine, slowly lower your leg all the way to the floor. Repeat this exercise with each leg __________ times. Complete this exercise __________ times per day. POSTURE AND BODY MECHANICS CONSIDERATIONS - [place] Keeping correct posture when sitting, standing or completing your activities will reduce the stress put on different body tissues, allowing injured tissues a chance to heal and limiting painful experiences. The following are general guidelines for improved posture. Your physician or physical therapist will provide you with any instructions specific to your needs. While reading these guidelines, remember:  The exercises prescribed by your provider will help you have the flexibility and strength to maintain correct postures.  The correct posture provides the optimal environment for your joints to work. All of your joints have less wear and tear when properly supported by a spine with good posture. This means you will experience a healthier, less painful body.  Correct posture must be practiced with all of your activities, especially prolonged sitting and standing. Correct posture is as important when doing repetitive low-stress activities (typing) as it is when doing a single heavy-load activity (lifting). RESTING POSITIONS Consider which positions are most painful for you when choosing a resting position. If you have pain with flexion-based activities (sitting, bending, stooping, squatting), choose a position that allows you to rest in a less flexed posture. You would want to avoid curling into a  fetal position on your side. If your pain worsens with extension-based activities (prolonged standing, working overhead), avoid resting in an extended position such as sleeping on your stomach. Most people will find more comfort when they rest with their spine in a more neutral position, neither too rounded nor too arched. Lying on a non-sagging bed on your side with a pillow between your knees, or on your back with a pillow under your knees will often provide some relief. Keep in mind, being in any one position for a prolonged period of time, no matter how correct your posture, can still lead to stiffness. PROPER SITTING POSTURE In order to minimize stress and discomfort on your spine, you must sit with correct posture Sitting with good posture should be effortless for a healthy body. Returning to good posture is a gradual process. Many people can work toward this most comfortably by using various supports until they have the flexibility and strength to maintain this posture on their own. When sitting with proper posture, your ears will fall over your shoulders and your shoulders will fall over your hips. You should use the back of the chair to support your upper back. Your low back will be in a neutral position, just slightly arched. You may place a small pillow or folded towel at the base of your low back for support.  When working at a desk, create an environment that supports good, upright posture. Without extra support, muscles fatigue and lead to excessive strain on joints and other tissues. Keep these recommendations in mind: CHAIR:   A chair should be able to slide under your desk when your back makes contact with the back of the chair. This allows you to work closely.  The chair's height should allow your eyes to be level with the upper part of your monitor and your hands to be slightly lower than your elbows.  BODY POSITION  Your feet should make contact with the floor. If this is not possible,  use a foot rest.  Keep your ears over your shoulders. This will reduce stress on your neck and low back. INCORRECT SITTING POSTURES   If you are feeling tired and unable to assume a healthy sitting posture, do not slouch or slump. This puts excessive strain on your back tissues, causing more damage and pain. Healthier options include:  Using more support, like a lumbar pillow.  Switching tasks to something that requires you to be upright or walking.  Talking a brief walk.  Lying down to rest in a neutral-spine position. PROLONGED STANDING WHILE SLIGHTLY LEANING FORWARD  When completing a task that requires you to lean forward while standing in one place for a long time, place either foot up on a stationary 2-4 inch high object to help maintain the best posture. When both feet are on the ground, the low back tends to lose its slight inward curve. If this curve flattens (or becomes too large), then the back and your other joints will experience too much stress, fatigue more quickly and can cause pain.  CORRECT STANDING POSTURES Proper standing posture should be assumed with all daily activities, even if they only take a few moments, like when brushing your teeth. As in sitting, your ears should fall over your shoulders and your shoulders should fall over your hips. You should keep a slight tension in your abdominal muscles to brace your spine. Your tailbone should point down to the ground, not behind your body, resulting in an over-extended swayback posture.  INCORRECT STANDING POSTURES  Common incorrect standing postures include a forward head, locked knees and/or an excessive swayback. WALKING Walk with an upright posture. Your ears, shoulders and hips should all line-up. PROLONGED ACTIVITY IN A FLEXED POSITION When completing a task that requires you to bend forward at your waist or lean over a low surface, try to find a way to stabilize 3 of 4 of your limbs. You can place a hand or elbow on  your thigh or rest a knee on the surface you are reaching across. This will provide you more stability so that your muscles do not fatigue as quickly. By keeping your knees relaxed, or slightly bent, you will also reduce stress across your low back. CORRECT LIFTING TECHNIQUES DO :   Assume a wide stance. This will provide you more stability and the opportunity to get as close as possible to the object which you are lifting.  Tense your abdominals to brace your spine; then bend at the knees and hips. Keeping your back locked in a neutral-spine position, lift using your leg muscles. Lift with your legs, keeping your back straight.  Test the weight of unknown objects before attempting to lift them.  Try to keep your elbows locked down at your sides in order get the best strength from your shoulders when carrying an object.  Always ask for help when lifting heavy or awkward objects. INCORRECT LIFTING TECHNIQUES DO NOT:   Lock your knees when lifting, even if it is a small object.  Bend and twist. Pivot at your feet or move your feet when needing to change directions.  Assume that you cannot safely pick up a paperclip without proper posture.   This information is not intended to replace advice given to you by your health care provider. Make sure you discuss any questions you have with your health care provider.   Document  Released: 10/15/2005 Document Revised: 03/01/2015 Document Reviewed: 01/27/2009 [employer] Interactive Patient Education [employer].     I personally performed the services described in this documentation, which was scribed in my presence. The recorded information has been reviewed and considered, and addended by me as needed.   Signed,   Merri Ray, MD [facility].  04/10/2016 6:56 PM

## 2016-04-10 NOTE — Patient Instructions (Addendum)
IF you received an x-ray today, you will receive an invoice from [facility]. Please contact [facility] at [phone] with questions or concerns regarding your invoice.   IF you received labwork today, you will receive an invoice from [employer]. Please contact [facility] at [phone] with questions or concerns regarding your invoice.   Our billing staff will not be able to assist you with questions regarding bills from these companies.  You will be contacted with the lab results as soon as they are available. The fastest way to get your results is to activate your My Chart account. Instructions are located on the last page of this paperwork. If you have not heard from [place] regarding the results in 2 weeks, please contact this office.    The rash on her upper buttocks does appear to be due to to a outbreak of herpes. Start the Valtrex 1 pill twice per day for the next 3 days. If the redness or swelling increases, or rashes present other areas, return for recheck.  Your buttock/back pain into the leg appears to be from sciatica. This can be due to a pinched nerve or spasm of the muscle. See information below. Continue meloxicam 1 pill once per day. Tylenol over-the-counter if needed, but do not use other over-the-counter pain relievers. Flexeril up to every 8 hours as needed, but start at bedtime as it does cause sedation. Heat as needed and range of motion throughout the day. If not improving in next 4-5 days, may need to try prednisone, but this could increase your blood sugars. Return in the next 5 days if not improving, sooner if worse.  The red bumps in your leg may be irritated hair follicles, or possible insect bites. If these increase or spread, return for recheck.   Genital Herpes Genital herpes is a common sexually transmitted infection (STI) that is caused by a virus. The virus is spread from person to person through sexual contact.  Infection can cause itching, blisters, and sores in the genital area or rectal area. This is called an outbreak. It affects both men and women. Genital herpes is particularly concerning for pregnant women because the virus can be passed to the baby during delivery and cause serious problems. Genital herpes is also a concern for people with a weakened defense (immune) system. Symptoms of genital herpes may last several days and then go away. However, the virus remains in your body, so you may have more outbreaks of symptoms in the future. The time between outbreaks varies and can be months or years. CAUSES Genital herpes is caused by a virus called herpes simplex virus (HSV) type 2 or HSV type 1. These viruses are contagious and are most often spread through sexual contact with an infected person. Sexual contact includes vaginal, anal, and oral sex. RISK FACTORS Risk factors for genital herpes include:  Being sexually active with multiple partners.  Having unprotected sex. SIGNS AND SYMPTOMS Symptoms may include:  Pain and itching in the genital area or rectal area.  Small red bumps that turn into blisters and then turn into sores.  Flu-like symptoms, including:  Fever.  Body aches.  Painful urination.  Vaginal discharge. DIAGNOSIS Genital herpes may be diagnosed by:  Physical exam.  Blood test.  Fluid culture test from an open sore. TREATMENT There is no cure for genital herpes. Oral antiviral medicines may be used to speed up healing and to help prevent the return of symptoms. These medicines  can also help to reduce the spread of the virus to sexual partners. HOME CARE INSTRUCTIONS  Keep the affected areas dry and clean.  Take medicines only as directed by your health care provider.  Do not have sexual contact during active infections. Genital herpes is contagious.  Practice safe sex. Latex condoms and female condoms may help to prevent the spread of the herpes  virus.  Avoid rubbing or touching the blisters and sores. If you do touch the blister or sores:  Wash your hands thoroughly.  Do not touch your eyes afterward.  If you become pregnant, tell your health care provider if you have had genital herpes.  Keep all follow-up visits as directed by your health care provider. This is important. PREVENTION  Use condoms. Although anyone can contract genital herpes during sexual contact even with the use of a condom, a condom can provide some protection.  Avoid having multiple sexual partners.  Talk to your sexual partner about any symptoms and past history that either of you may have.  Get tested before you have sex. Ask your partner to do the same.  Recognize the symptoms of genital herpes. Do not have sexual contact if you notice these symptoms. SEEK MEDICAL CARE IF:  Your symptoms are not improving with medicine.  Your symptoms return.  You have new symptoms.  You have a fever.  You have abdominal pain.  You have redness, swelling, or pain in your eye. MAKE SURE YOU:  Understand these instructions.  Will watch your condition.  Will get help right away if you are not doing well or get worse.   This information is not intended to replace advice given to you by your health care provider. Make sure you discuss any questions you have with your health care provider.   Document Released: 10/12/2000 Document Revised: 11/05/2014 Document Reviewed: 03/02/2014 Elsevier Interactive Patient Education 2016 [place] The sciatic nerve runs from the back down the leg and is responsible for sensation and control of the muscles in the back (posterior) side of the thigh, lower leg, and foot. Sciatica is a condition that is characterized by inflammation of this nerve.  SYMPTOMS   Signs of nerve damage, including numbness and/or weakness along the posterior side of the lower extremity.  Pain in the back of the thigh  that may also travel down the leg.  Pain that worsens when sitting for long periods of time.  Occasionally, pain in the back or buttock. CAUSES  Inflammation of the sciatic nerve is the cause of sciatica. The inflammation is due to something irritating the nerve. Common sources of irritation include:  Sitting for long periods of time.  Direct trauma to the nerve.  Arthritis of the spine.  Herniated or ruptured disk.  Slipping of the vertebrae (spondylolisthesis).  Pressure from soft tissues, such as muscles or ligament-like tissue (fascia). RISK INCREASES WITH:  Sports that place pressure or stress on the spine (football or weightlifting).  Poor strength and flexibility.  Failure to warm up properly before activity.  Family history of low back pain or disk disorders.  Previous back injury or surgery.  Poor body mechanics, especially when lifting, or poor posture. PREVENTION   Warm up and stretch properly before activity.  Maintain physical fitness:  Strength, flexibility, and endurance.  Cardiovascular fitness.  Learn and use proper technique, especially with posture and lifting. When possible, have coach correct improper technique.  Avoid activities that place stress on the spine.  PROGNOSIS If treated properly, then sciatica usually resolves within 6 weeks. However, occasionally surgery is necessary.  RELATED COMPLICATIONS   Permanent nerve damage, including pain, numbness, tingle, or weakness.  Chronic back pain.  Risks of surgery: infection, bleeding, nerve damage, or damage to surrounding tissues. TREATMENT Treatment initially involves resting from any activities that aggravate your symptoms. The use of ice and medication may help reduce pain and inflammation. The use of strengthening and stretching exercises may help reduce pain with activity. These exercises may be performed at home or with referral to a therapist. A therapist may recommend further  treatments, such as transcutaneous electronic nerve stimulation (TENS) or ultrasound. Your caregiver may recommend corticosteroid injections to help reduce inflammation of the sciatic nerve. If symptoms persist despite non-surgical (conservative) treatment, then surgery may be recommended. MEDICATION  If pain medication is necessary, then nonsteroidal anti-inflammatory medications, such as aspirin and ibuprofen, or other minor pain relievers, such as acetaminophen, are often recommended.  Do not take pain medication for 7 days before surgery.  Prescription pain relievers may be given if deemed necessary by your caregiver. Use only as directed and only as much as you need.  Ointments applied to the skin may be helpful.  Corticosteroid injections may be given by your caregiver. These injections should be reserved for the most serious cases, because they may only be given a certain number of times. HEAT AND COLD  Cold treatment (icing) relieves pain and reduces inflammation. Cold treatment should be applied for 10 to 15 minutes every 2 to 3 hours for inflammation and pain and immediately after any activity that aggravates your symptoms. Use ice packs or massage the area with a piece of ice (ice massage).  Heat treatment may be used prior to performing the stretching and strengthening activities prescribed by your caregiver, physical therapist, or athletic trainer. Use a heat pack or soak the injury in warm water. SEEK MEDICAL CARE IF:  Treatment seems to offer no benefit, or the condition worsens.  Any medications produce adverse side effects. EXERCISES  RANGE OF MOTION (ROM) AND STRETCHING EXERCISES - Sciatica Most people with sciatic will find that their symptoms worsen with either excessive bending forward (flexion) or arching at the low back (extension). The exercises which will help resolve your symptoms will focus on the opposite motion. Your physician, physical therapist or athletic  trainer will help you determine which exercises will be most helpful to resolve your low back pain. Do not complete any exercises without first consulting with your clinician. Discontinue any exercises which worsen your symptoms until you speak to your clinician. If you have pain, numbness or tingling which travels down into your buttocks, leg or foot, the goal of the therapy is for these symptoms to move closer to your back and eventually resolve. Occasionally, these leg symptoms will get better, but your low back pain may worsen; this is typically an indication of progress in your rehabilitation. Be certain to be very alert to any changes in your symptoms and the activities in which you participated in the 24 hours prior to the change. Sharing this information with your clinician will allow him/her to most efficiently treat your condition. These exercises may help you when beginning to rehabilitate your injury. Your symptoms may resolve with or without further involvement from your physician, physical therapist or athletic trainer. While completing these exercises, remember:   Restoring tissue flexibility helps normal motion to return to the joints. This allows healthier, less painful movement and activity.  An effective stretch should be held for at least 30 seconds.  A stretch should never be painful. You should only feel a gentle lengthening or release in the stretched tissue. FLEXION RANGE OF MOTION AND STRETCHING EXERCISES: STRETCH - Flexion, Single Knee to Chest   Lie on a firm bed or floor with both legs extended in front of you.  Keeping one leg in contact with the floor, bring your opposite knee to your chest. Hold your leg in place by either grabbing behind your thigh or at your knee.  Pull until you feel a gentle stretch in your low back. Hold __________ seconds.  Slowly release your grasp and repeat the exercise with the opposite side. Repeat __________ times. Complete this exercise  __________ times per day.  STRETCH - Flexion, Double Knee to Chest  Lie on a firm bed or floor with both legs extended in front of you.  Keeping one leg in contact with the floor, bring your opposite knee to your chest.  Tense your stomach muscles to support your back and then lift your other knee to your chest. Hold your legs in place by either grabbing behind your thighs or at your knees.  Pull both knees toward your chest until you feel a gentle stretch in your low back. Hold __________ seconds.  Tense your stomach muscles and slowly return one leg at a time to the floor. Repeat __________ times. Complete this exercise __________ times per day.  STRETCH - Low Trunk Rotation   Lie on a firm bed or floor. Keeping your legs in front of you, bend your knees so they are both pointed toward the ceiling and your feet are flat on the floor.  Extend your arms out to the side. This will stabilize your upper body by keeping your shoulders in contact with the floor.  Gently and slowly drop both knees together to one side until you feel a gentle stretch in your low back. Hold for __________ seconds.  Tense your stomach muscles to support your low back as you bring your knees back to the starting position. Repeat the exercise to the other side. Repeat __________ times. Complete this exercise __________ times per day  EXTENSION RANGE OF MOTION AND FLEXIBILITY EXERCISES: STRETCH - Extension, Prone on Elbows  Lie on your stomach on the floor, a bed will be too soft. Place your palms about shoulder width apart and at the height of your head.  Place your elbows under your shoulders. If this is too painful, stack pillows under your chest.  Allow your body to relax so that your hips drop lower and make contact more completely with the floor.  Hold this position for __________ seconds.  Slowly return to lying flat on the floor. Repeat __________ times. Complete this exercise __________ times per day.   RANGE OF MOTION - Extension, Prone Press Ups  Lie on your stomach on the floor, a bed will be too soft. Place your palms about shoulder width apart and at the height of your head.  Keeping your back as relaxed as possible, slowly straighten your elbows while keeping your hips on the floor. You may adjust the placement of your hands to maximize your comfort. As you gain motion, your hands will come more underneath your shoulders.  Hold this position __________ seconds.  Slowly return to lying flat on the floor. Repeat __________ times. Complete this exercise __________ times per day.  STRENGTHENING EXERCISES - Sciatica  These exercises may help you when  beginning to rehabilitate your injury. These exercises should be done near your "sweet spot." This is the neutral, low-back arch, somewhere between fully rounded and fully arched, that is your least painful position. When performed in this safe range of motion, these exercises can be used for people who have either a flexion or extension based injury. These exercises may resolve your symptoms with or without further involvement from your physician, physical therapist or athletic trainer. While completing these exercises, remember:   Muscles can gain both the endurance and the strength needed for everyday activities through controlled exercises.  Complete these exercises as instructed by your physician, physical therapist or athletic trainer. Progress with the resistance and repetition exercises only as your caregiver advises.  You may experience muscle soreness or fatigue, but the pain or discomfort you are trying to eliminate should never worsen during these exercises. If this pain does worsen, stop and make certain you are following the directions exactly. If the pain is still present after adjustments, discontinue the exercise until you can discuss the trouble with your clinician. STRENGTHENING - Deep Abdominals, Pelvic Tilt   Lie on a firm  bed or floor. Keeping your legs in front of you, bend your knees so they are both pointed toward the ceiling and your feet are flat on the floor.  Tense your lower abdominal muscles to press your low back into the floor. This motion will rotate your pelvis so that your tail bone is scooping upwards rather than pointing at your feet or into the floor.  With a gentle tension and even breathing, hold this position for __________ seconds. Repeat __________ times. Complete this exercise __________ times per day.  STRENGTHENING - Abdominals, Crunches   Lie on a firm bed or floor. Keeping your legs in front of you, bend your knees so they are both pointed toward the ceiling and your feet are flat on the floor. Cross your arms over your chest.  Slightly tip your chin down without bending your neck.  Tense your abdominals and slowly lift your trunk high enough to just clear your shoulder blades. Lifting higher can put excessive stress on the low back and does not further strengthen your abdominal muscles.  Control your return to the starting position. Repeat __________ times. Complete this exercise __________ times per day.  STRENGTHENING - Quadruped, Opposite UE/LE Lift  Assume a hands and knees position on a firm surface. Keep your hands under your shoulders and your knees under your hips. You may place padding under your knees for comfort.  Find your neutral spine and gently tense your abdominal muscles so that you can maintain this position. Your shoulders and hips should form a rectangle that is parallel with the floor and is not twisted.  Keeping your trunk steady, lift your right hand no higher than your shoulder and then your left leg no higher than your hip. Make sure you are not holding your breath. Hold this position __________ seconds.  Continuing to keep your abdominal muscles tense and your back steady, slowly return to your starting position. Repeat with the opposite arm and  leg. Repeat __________ times. Complete this exercise __________ times per day.  STRENGTHENING - Abdominals and Quadriceps, Straight Leg Raise   Lie on a firm bed or floor with both legs extended in front of you.  Keeping one leg in contact with the floor, bend the other knee so that your foot can rest flat on the floor.  Find your neutral spine, and  tense your abdominal muscles to maintain your spinal position throughout the exercise.  Slowly lift your straight leg off the floor about 6 inches for a count of 15, making sure to not hold your breath.  Still keeping your neutral spine, slowly lower your leg all the way to the floor. Repeat this exercise with each leg __________ times. Complete this exercise __________ times per day. POSTURE AND BODY MECHANICS CONSIDERATIONS - Sciatica Keeping correct posture when sitting, standing or completing your activities will reduce the stress put on different body tissues, allowing injured tissues a chance to heal and limiting painful experiences. The following are general guidelines for improved posture. Your physician or physical therapist will provide you with any instructions specific to your needs. While reading these guidelines, remember:  The exercises prescribed by your provider will help you have the flexibility and strength to maintain correct postures.  The correct posture provides the optimal environment for your joints to work. All of your joints have less wear and tear when properly supported by a spine with good posture. This means you will experience a healthier, less painful body.  Correct posture must be practiced with all of your activities, especially prolonged sitting and standing. Correct posture is as important when doing repetitive low-stress activities (typing) as it is when doing a single heavy-load activity (lifting). RESTING POSITIONS Consider which positions are most painful for you when choosing a resting position. If you have  pain with flexion-based activities (sitting, bending, stooping, squatting), choose a position that allows you to rest in a less flexed posture. You would want to avoid curling into a fetal position on your side. If your pain worsens with extension-based activities (prolonged standing, working overhead), avoid resting in an extended position such as sleeping on your stomach. Most people will find more comfort when they rest with their spine in a more neutral position, neither too rounded nor too arched. Lying on a non-sagging bed on your side with a pillow between your knees, or on your back with a pillow under your knees will often provide some relief. Keep in mind, being in any one position for a prolonged period of time, no matter how correct your posture, can still lead to stiffness. PROPER SITTING POSTURE In order to minimize stress and discomfort on your spine, you must sit with correct posture Sitting with good posture should be effortless for a healthy body. Returning to good posture is a gradual process. Many people can work toward this most comfortably by using various supports until they have the flexibility and strength to maintain this posture on their own. When sitting with proper posture, your ears will fall over your shoulders and your shoulders will fall over your hips. You should use the back of the chair to support your upper back. Your low back will be in a neutral position, just slightly arched. You may place a small pillow or folded towel at the base of your low back for support.  When working at a desk, create an environment that supports good, upright posture. Without extra support, muscles fatigue and lead to excessive strain on joints and other tissues. Keep these recommendations in mind: CHAIR:   A chair should be able to slide under your desk when your back makes contact with the back of the chair. This allows you to work closely.  The chair's height should allow your eyes to be  level with the upper part of your monitor and your hands to be slightly lower than  your elbows. BODY POSITION  Your feet should make contact with the floor. If this is not possible, use a foot rest.  Keep your ears over your shoulders. This will reduce stress on your neck and low back. INCORRECT SITTING POSTURES   If you are feeling tired and unable to assume a healthy sitting posture, do not slouch or slump. This puts excessive strain on your back tissues, causing more damage and pain. Healthier options include:  Using more support, like a lumbar pillow.  Switching tasks to something that requires you to be upright or walking.  Talking a brief walk.  Lying down to rest in a neutral-spine position. PROLONGED STANDING WHILE SLIGHTLY LEANING FORWARD  When completing a task that requires you to lean forward while standing in one place for a long time, place either foot up on a stationary 2-4 inch high object to help maintain the best posture. When both feet are on the ground, the low back tends to lose its slight inward curve. If this curve flattens (or becomes too large), then the back and your other joints will experience too much stress, fatigue more quickly and can cause pain.  CORRECT STANDING POSTURES Proper standing posture should be assumed with all daily activities, even if they only take a few moments, like when brushing your teeth. As in sitting, your ears should fall over your shoulders and your shoulders should fall over your hips. You should keep a slight tension in your abdominal muscles to brace your spine. Your tailbone should point down to the ground, not behind your body, resulting in an over-extended swayback posture.  INCORRECT STANDING POSTURES  Common incorrect standing postures include a forward head, locked knees and/or an excessive swayback. WALKING Walk with an upright posture. Your ears, shoulders and hips should all line-up. PROLONGED ACTIVITY IN A FLEXED  POSITION When completing a task that requires you to bend forward at your waist or lean over a low surface, try to find a way to stabilize 3 of 4 of your limbs. You can place a hand or elbow on your thigh or rest a knee on the surface you are reaching across. This will provide you more stability so that your muscles do not fatigue as quickly. By keeping your knees relaxed, or slightly bent, you will also reduce stress across your low back. CORRECT LIFTING TECHNIQUES DO :   Assume a wide stance. This will provide you more stability and the opportunity to get as close as possible to the object which you are lifting.  Tense your abdominals to brace your spine; then bend at the knees and hips. Keeping your back locked in a neutral-spine position, lift using your leg muscles. Lift with your legs, keeping your back straight.  Test the weight of unknown objects before attempting to lift them.  Try to keep your elbows locked down at your sides in order get the best strength from your shoulders when carrying an object.  Always ask for help when lifting heavy or awkward objects. INCORRECT LIFTING TECHNIQUES DO NOT:   Lock your knees when lifting, even if it is a small object.  Bend and twist. Pivot at your feet or move your feet when needing to change directions.  Assume that you cannot safely pick up a paperclip without proper posture.   This information is not intended to replace advice given to you by your health care provider. Make sure you discuss any questions you have with your health care provider.  Document Released: 10/15/2005 Document Revised: 03/01/2015 Document Reviewed: 01/27/2009 Elsevier Interactive Patient Education [employer].

## 2016-04-11 ENCOUNTER — Encounter: Payer: Self-pay | Admitting: Family Medicine

## 2016-04-12 ENCOUNTER — Other Ambulatory Visit: Payer: Self-pay | Admitting: Physician Assistant

## 2016-04-16 ENCOUNTER — Ambulatory Visit (INDEPENDENT_AMBULATORY_CARE_PROVIDER_SITE_OTHER): Payer: BLUE CROSS/BLUE SHIELD | Admitting: Family Medicine

## 2016-04-16 VITALS — BP 119/82 | HR 95 | Temp 98.1°F | Resp 16 | Ht 62.0 in | Wt 285.0 lb

## 2016-04-16 DIAGNOSIS — E1165 Type 2 diabetes mellitus with hyperglycemia: Secondary | ICD-10-CM | POA: Diagnosis not present

## 2016-04-16 DIAGNOSIS — M5410 Radiculopathy, site unspecified: Secondary | ICD-10-CM

## 2016-04-16 DIAGNOSIS — A499 Bacterial infection, unspecified: Secondary | ICD-10-CM | POA: Diagnosis not present

## 2016-04-16 DIAGNOSIS — N760 Acute vaginitis: Secondary | ICD-10-CM | POA: Diagnosis not present

## 2016-04-16 DIAGNOSIS — N949 Unspecified condition associated with female genital organs and menstrual cycle: Secondary | ICD-10-CM | POA: Diagnosis not present

## 2016-04-16 DIAGNOSIS — R8271 Bacteriuria: Secondary | ICD-10-CM

## 2016-04-16 DIAGNOSIS — R102 Pelvic and perineal pain: Secondary | ICD-10-CM

## 2016-04-16 DIAGNOSIS — B9689 Other specified bacterial agents as the cause of diseases classified elsewhere: Secondary | ICD-10-CM

## 2016-04-16 DIAGNOSIS — R252 Cramp and spasm: Secondary | ICD-10-CM | POA: Diagnosis not present

## 2016-04-16 DIAGNOSIS — A6004 Herpesviral vulvovaginitis: Secondary | ICD-10-CM | POA: Diagnosis not present

## 2016-04-16 DIAGNOSIS — B3749 Other urogenital candidiasis: Secondary | ICD-10-CM | POA: Diagnosis not present

## 2016-04-16 DIAGNOSIS — IMO0001 Reserved for inherently not codable concepts without codable children: Secondary | ICD-10-CM

## 2016-04-16 LAB — POCT URINALYSIS DIP (MANUAL ENTRY)
BILIRUBIN UA: NEGATIVE
Bilirubin, UA: NEGATIVE
Nitrite, UA: NEGATIVE
Protein Ur, POC: NEGATIVE
SPEC GRAV UA: 1.015
Urobilinogen, UA: 0.2
pH, UA: 7

## 2016-04-16 LAB — COMPREHENSIVE METABOLIC PANEL
ALK PHOS: 75 U/L (ref 33–115)
ALT: 24 U/L (ref 6–29)
AST: 14 U/L (ref 10–30)
Albumin: 4.2 g/dL (ref 3.6–5.1)
BUN: 11 mg/dL (ref 7–25)
CO2: 27 mmol/L (ref 20–31)
CREATININE: 0.55 mg/dL (ref 0.50–1.10)
Calcium: 9.4 mg/dL (ref 8.6–10.2)
Chloride: 100 mmol/L (ref 98–110)
Glucose, Bld: 102 mg/dL — ABNORMAL HIGH (ref 65–99)
POTASSIUM: 4 mmol/L (ref 3.5–5.3)
Sodium: 136 mmol/L (ref 135–146)
TOTAL PROTEIN: 7.6 g/dL (ref 6.1–8.1)
Total Bilirubin: 0.4 mg/dL (ref 0.2–1.2)

## 2016-04-16 LAB — POCT WET + KOH PREP: Trich by wet prep: ABSENT

## 2016-04-16 LAB — POCT SKIN KOH: SKIN KOH, POC: POSITIVE

## 2016-04-16 LAB — POC MICROSCOPIC URINALYSIS (UMFC): MUCUS RE: ABSENT

## 2016-04-16 LAB — TSH: TSH: 3.57 mIU/L

## 2016-04-16 LAB — CK: CK TOTAL: 48 U/L (ref 7–177)

## 2016-04-16 LAB — GLUCOSE, POCT (MANUAL RESULT ENTRY): POC GLUCOSE: 102 mg/dL — AB (ref 70–99)

## 2016-04-16 LAB — MAGNESIUM: Magnesium: 2.1 mg/dL (ref 1.5–2.5)

## 2016-04-16 MED ORDER — GLIMEPIRIDE 4 MG PO TABS: 8.0000 mg | ORAL_TABLET | Freq: Two times a day (BID) | ORAL | Status: DC

## 2016-04-16 MED ORDER — FLUCONAZOLE 150 MG PO TABS: 150.0000 mg | ORAL_TABLET | Freq: Every day | ORAL | Status: DC

## 2016-04-16 MED ORDER — NYSTATIN-TRIAMCINOLONE 100000-0.1 UNIT/GM-% EX OINT: 1.0000 "application " | TOPICAL_OINTMENT | Freq: Two times a day (BID) | CUTANEOUS | Status: DC

## 2016-04-16 MED ORDER — CYCLOBENZAPRINE HCL ER 30 MG PO CP24: 30.0000 mg | ORAL_CAPSULE | Freq: Every day | ORAL | Status: DC

## 2016-04-16 MED ORDER — NYSTATIN 100000 UNIT/GM EX POWD: 500000.0000 g | Freq: Two times a day (BID) | CUTANEOUS | Status: DC

## 2016-04-16 MED ORDER — METHOCARBAMOL 750 MG PO TABS: 750.0000 mg | ORAL_TABLET | Freq: Four times a day (QID) | ORAL | Status: DC | PRN

## 2016-04-16 MED ORDER — METRONIDAZOLE 500 MG PO TABS: 500.0000 mg | ORAL_TABLET | Freq: Two times a day (BID) | ORAL | Status: DC

## 2016-04-16 MED ORDER — DICLOFENAC SODIUM 75 MG PO TBEC: 75.0000 mg | DELAYED_RELEASE_TABLET | Freq: Two times a day (BID) | ORAL | Status: DC | PRN

## 2016-04-16 NOTE — Patient Instructions (Addendum)
IF you received an x-ray today, you will receive an invoice from [facility]. Please contact [facility] at [phone] with questions or concerns regarding your invoice.   IF you received labwork today, you will receive an invoice from [employer]. Please contact [facility] at [phone] with questions or concerns regarding your invoice.   Our billing staff will not be able to assist you with questions regarding bills from these companies.  You will be contacted with the lab results as soon as they are available. The fastest way to get your results is to activate your My Chart account. Instructions are located on the last page of this paperwork. If you have not heard from [place] regarding the results in 2 weeks, please contact this office.    Candidiasis cutnea  (Cutaneous Candidiasis) La candidiasis cutnea es un trastorno en el que hay un desarrollo excesivo de hongos (Cndida) en la piel. Los hongos normalmente viven en la piel, pero en pequeas cantidades y no causan ningn sntoma. En ciertos casos, un mayor desarrollo de los hongos puede causar una verdadera infeccin por hongos. Este tipo de infeccin ocurre generalmente en reas de la piel que son constantemente clidas y [place], como las axilas o la ingle. El hongo es la causa ms comn de dermatitis del paal en los bebs y en personas que no pueden controlar sus movimientos intestinales (incontinencia).  CAUSAS  El hongo que causa candidiasis cutnea con ms frecuencia es Candida albicans. Las [employer] pueden aumentar el riesgo de contraer una infeccin por hongos en la piel son:   Arman Bogus.  Embarazo.  Diabetes.  Tomar antibiticos.  Tomar pldoras anticonceptivas.  Tomar corticoides.  La enfermedad tiroidea.  Una deficiencia de hierro o zinc.  Problemas del sistema inmunolgico. SNTOMAS   Zona de la piel roja e hinchada.  Bultos en la piel. Vaginitis  monilisica (Monilial Vaginitis) La vaginitis es una inflamacin (irritacin, hinchazn) de la vagina y la vulva. Esta no es una enfermedad de transmisin sexual.  CAUSAS Este tipo de vaginitis lo causa un hongo (candida) que normalmente se encuentra en la vagina. El hongo candida se ha desarrollado hasta el punto de ocasionar problemas en el equilibrio qumico. SNTOMAS Secrecin vaginal espesa y blanca. Hinchazn, picazn, enrojecimiento e inflamacin de la vagina y en algunos casos de los labios vaginales (vulva). Ardor o dolor al [employer]. Dolor en [place]. DIAGNSTICO Los factores que favorecen la vaginitis moniliasica son: Kyla Balzarine de virginidad y postmenopusicas. Embarazo. Infecciones. Sentir cansancio, estar enferma o estresada, especialmente si ya ha sufrido este problema en el pasado. Diabetes Buen control ayudar a disminur la probabilidad. Pldoras anticonceptivas [place] interior [place]. El uso de espumas de bao, aerosoles femeninos duchas vaginales o tampones con desodorante. Algunos antibiticos (medicamentos que destruyen grmenes). Si contrae alguna enfermedad puede sufrir recurrencias espordicas. Pueblitos profesional que lo asiste prescribir medicamentos. Hay diferentes tipos de cremas y supositorios vaginales que tratan especficamente la vaginitis monilisica. Para infecciones por hongos recurrentes, utilice un supositorio o crema en la vagina dos veces por semana, o segn se le indique. Tambin podrn utilizarse cremas con corticoides o anti monilisicas para la picazn o la irritacin de la vulva. Consulte con el profesional que la asiste. Si la crema no da resultado, podr aplicarse en la vagina una solucin con azul de metileno. El consumo de yogur puede prevenir este tipo de vaginitis. INSTRUCCIONES [place] todos los medicamentos tal como se le indic. No mantenga  relaciones sexuales hasta que el tratamiento se  haya completado, o segn las indicaciones del profesional que la asiste. Tome baos de asiento tibios. No se aplique duchas vaginales. No utilice tampones, especialmente los perfumados. Use ropa interior de algodn [employer] pantalones ajustados y las medias tipo panty. Comunique a sus compaeros sexuales que sufre una infeccin por hongos. Ellos deben concurrir para un control mdico si tienen sntomas como una urticaria leve o picazn. Sus compaeros sexuales deben tratarse tambin si la infeccin es difcil de Radiographer, therapeutic. Practique el sexo seguro - use condones Algunos medicamentos vaginales ocasionan fallas en los condones de ltex. Los medicamentos vaginales que pueden daar los condones son: Building services engineer cleocina Butoconazole (Femstat) Terconazole (Terazol) supositorios vaginales Miconazole (Monistat) (es un medicamento de venta libre) SOLICITE ATENCIN MDICA SI: Waldron Session tiene una temperatura oral de ms de 38,9 C (102 F). Si la infeccin empeora luego de 2 das de tratamiento. Si la infeccin no mejora luego de 3 das de tratamiento. Aparecen ampollas en o alrededor de la vagina. Si aparece una hemorragia vaginal y no es el momento del perodo. Siente dolor al [employer]. Presenta problemas intestinales. Tiene dolor durante las [employer].   Esta informacin no tiene Marine scientist el consejo del mdico. Asegrese de hacerle al mdico cualquier pregunta que tenga.   Document Released: 07/25/2005 Document Revised: 01/07/2012 Elsevier Interactive Patient Education 2016 [place]. DIAGNSTICO  El diagnstico de la candidiasis cutnea se basa generalmente en su apariencia. Podrn realizarle unos ligeros raspados en la piel que se observarn bajo un microscopio para determinar la presencia de hongos.  TRATAMIENTO  Cremas antimicticas pueden aplicarse sobre la piel infectada. En los casos graves, pueden ser necesario tomar medicamentos por va oral.   INSTRUCCIONES PARA EL CUIDADO EN EL HOGAR   Mantenga la piel limpia y [place].  Mantenga un peso saludable.  Si tiene diabetes, mantenga bajo control el nivel de Dispensing optician. SOLICITE ATENCIN MDICA DE INMEDIATO SI:   Su erupcin contina extendindose a pesar del tratamiento.  Tiene fiebre, siente escalofros o dolor abdominal.   Esta informacin no tiene Marine scientist el consejo del mdico. Asegrese de hacerle al mdico cualquier pregunta que tenga.   Document Released: 10/04/2011 Document Revised: 01/07/2012 Elsevier Interactive Patient Education 2016 [place] bacteriana (Bacterial Vaginosis) La vaginosis bacteriana es una infeccin vaginal que perturba el equilibrio normal de las bacterias que se encuentran en la vagina. Es el resultado de un crecimiento excesivo de ciertas bacterias. Esta es la infeccin vaginal ms frecuente en mujeres en edad reproductiva. El tratamiento es importante para prevenir complicaciones, especialmente en mujeres embarazadas, dado que puede causar un parto prematuro. CAUSAS  La vaginosis bacteriana se origina por un aumento de bacterias nocivas que, generalmente, estn presentes en cantidades ms pequeas en la vagina. Varios tipos diferentes de bacterias pueden causar esta afeccin. Sin embargo, la causa de su desarrollo no se comprende totalmente. [place] o comportamientos pueden exponerlo a un mayor riesgo de desarrollar vaginosis bacteriana, entre los que se incluyen:  Tener una nueva pareja sexual o mltiples parejas sexuales.  Las duchas vaginales  El uso del DIU (dispositivo intrauterino) como mtodo anticonceptivo. El contagio no se produce en baos, por ropas de cama, en piscinas o por contacto con objetos. SIGNOS Y SNTOMAS  Algunas mujeres que padecen vaginosis bacteriana no presentan signos ni sntomas. Los sntomas ms comunes son:  Secrecin vaginal de color  grisceo.  Secrecin vaginal con olor similar  al pescado, especialmente despus de Retail banker.  Picazn o sensacin de ardor en la vagina o la vulva.  Ardor o dolor al [employer]. DIAGNSTICO  Su mdico analizar su historia clnica y le examinar la vagina para detectar signos de vaginosis bacteriana. Puede tomarle Truddie Coco de flujo vaginal. Su mdico examinar esta muestra con un microscopio para controlar las bacterias y clulas anormales. Tambin puede realizarse un anlisis del pH vaginal.  TRATAMIENTO  La vaginosis bacteriana puede tratarse con antibiticos, en forma de comprimidos o de crema vaginal. Puede indicarse una segunda tanda de antibiticos si la afeccin se repite despus del tratamiento. Debido a que la vaginosis bacteriana aumenta el riesgo de contraer enfermedades de transmisin sexual, el tratamiento puede ayudar a reducir el riesgo de clamidia, [place], VIH y herpes. [place] solo medicamentos de venta libre o recetados, segn las indicaciones del mdico.  Si le han recetado antibiticos, tmelos como se le indic. Asegrese de que finaliza la prescripcin completa aunque se sienta mejor.  Comunique a sus compaeros sexuales que sufre una infeccin vaginal. Deben consultar a su mdico y recibir tratamiento si tienen problemas, como picazn o una erupcin cutnea leve.  Durante el [place], es importante que siga estas indicaciones:  Visual merchandiser relaciones sexuales o use preservativos de la forma correcta.  No se haga duchas vaginales.  Evite consumir alcohol como se lo haya indicado el mdico.  Community education officer se lo haya indicado el mdico. SOLICITE ATENCIN MDICA SI:   Sus sntomas no mejoran despus de 3 das de [place].  Aumenta la secrecin o Conservation officer, historic buildings.  Tiene fiebre. ASEGRESE DE QUE:   Comprende estas instrucciones.  Controlar su afeccin.  Recibir ayuda de inmediato si no  mejora o si empeora. PARA OBTENER MS INFORMACIN  Centros para el control y la prevencin de Probation officer for Disease Control and Prevention, CDC): AppraiserFraud.fi Asociacin Estadounidense de la Salud Sexual (American Sexual Health Association, SHA): www.ashastd.org    Esta informacin no tiene Marine scientist el consejo del mdico. Asegrese de hacerle al mdico cualquier pregunta que tenga.   Document Released: 01/22/2008 Document Revised: 11/05/2014 Elsevier Interactive Patient Education [employer].

## 2016-04-16 NOTE — Progress Notes (Signed)
By signing my name below, I, Mesha Guinyard, attest that this documentation has been prepared under the direction and in the presence of Delman Cheadle, MD.  Electronically Signed: Verlee Monte, Medical Scribe. 04/16/2016. 9:03 AM.  Subjective:    Patient ID: [name], female    DOB: [birth year], 40 y.o.   MRN: [id number]  HPI Chief Complaint  Patient presents with  . Follow-up    lower back pain  . redness in vagina    x 3 days, pt thinks med valacyclovir made the redness     HPI Comments: [name] is a 40 y.o. female who presents to the [facility] complaining of upper right lateral buttock pain onset 7 days ago. Pt has a lateral burning sensation in both thighs (right hurts worse than left ). Pt states the medication that was given to her by Dr. Carlota Raspberry works but caused her to have vaginal discharge.  Pt reports a yellowish vaginal discharge. Pt can't walk or use under garmets due to vaginal pain . Pt took a hot shower and went to sleep with no relief to her symptoms. Pain worsens when she sits- pt has to lean to the side for relief. Pt reports the pain is similar to the pain after giving birth. Pt states light duty isn't relieving her symptoms.  Pain worsens when pt starts walking. She has to bend to relieve pain in her back- improvement in symptoms with forwards flexion. Pt is sexually active and uses condoms for birth control.  Pt fell in her house May 27th when trying to get in bed. Pt got a bruise on her right later right lower leg, and an abrasion on her back from the fall.  In 2012 she had a pap smear and was told she had a small minor surgery. Her incision wasn't closed up for drainage. 3 days after she had itchiness from the site and an outbreak of little bumps. Pt got it looked at and was told she should wipe it down to clean. Pt states this is when the reaction started in her genitals. Pt later went to the ED and they just calmed down the itchiness  because it was a yeast infection. Then they recomeneded her to come here([facility]) and was treated by Dr. Steffanie Dunn Pt was GYN Dr. Rudi Rummage, they took a skin sample and was told it was herpes  Pt was was seen 6 days ago by Dr. Hervey Ard for sciatica was put on Flexeril every 8 hours PRN. It was suspected she had a herpes out break on her upper buttock so she was put on Valterx BID for 3 days. She had a rash of unlnown etiology suspected foliculitis instead of insect bite.  Pt would like to request a refill on Amaryl. Pt has been at her job for less than 1 year so she doesn't apply to [employer]. She has agreed to stay out of work this week, and will recheck in 5 days.  Patient Active Problem List   Diagnosis Date Noted  . Hyperthyroidism 07/28/2015  . Essential hypertension, benign 06/29/2015  . Allergic rhinitis due to pollen 06/29/2015  . Anxiety and depression 06/29/2015  . Gastroesophageal reflux disease without esophagitis 06/29/2015  . OSA on CPAP 06/29/2015  . History of syphilis 05/23/2015  . Amenorrhea 05/23/2015  . Severe obesity (BMI >= 40) ([place]) 09/14/2014  . Irregular menstrual cycle 11/12/2013  . Type II diabetes mellitus, uncontrolled ([place]) 06/20/2009  . POLYCYSTIC OVARIAN DISEASE 06/20/2009  .  CARPAL TUNNEL SYNDROME 06/20/2009   Past Medical History  Diagnosis Date  . H. pylori infection     dx  09-02-2014-- treated w/ antibiotic  . GERD (gastroesophageal reflux disease)   . Type 2 diabetes mellitus ([place])   . Carpal tunnel syndrome, bilateral   . PCOS (polycystic ovarian syndrome)   . Menorrhagia   . Irregular menstrual cycle   . Hypertension   . Depression    Past Surgical History  Procedure Laterality Date  . Cesarean section  1995  . Carpal tunnel release Bilateral 10/07/2014    Procedure: CARPAL TUNNEL RELEASE BILATERAL;  Surgeon: [name], MD;  Location: [facility];  Service: Orthopedics;  Laterality: Bilateral;   Allergies  Allergen Reactions    . 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 Shrimp [Shellfish Allergy] Anaphylaxis   Prior to Admission medications   Medication Sig Start Date End Date Taking? Authorizing Provider  canagliflozin (INVOKANA) 100 MG TABS tablet Take 1 tablet (100 mg total) by mouth daily. 11/01/15  Yes Ezekiel Slocumb, PA-C  clindamycin (CLINDAGEL) 1 % gel Apply topically 2 (two) times daily. 09/14/15  Yes Wardell Honour, MD  clobetasol cream (TEMOVATE) [id number] % APPLY ONE APPLICATION TOPICALLY TWO TIMES DAILY FOR 10 DAYS 03/24/16  Yes Wardell Honour, MD  cyclobenzaprine (FLEXERIL) 5 MG tablet 1 pill by mouth up to every 8 hours as needed. Start with one pill by mouth each bedtime as needed due to sedation 04/10/16  Yes Wendie Agreste, MD  EPINEPHrine (EPIPEN 2-PAK) 0.3 mg/0.3 mL IJ SOAJ injection Inject 0.3 mLs (0.3 mg total) into the muscle once. 05/09/15  Yes Orpah Greek, MD  FLUoxetine (PROZAC) 20 MG tablet Take 2 tablets (40 mg total) by mouth daily. 11/01/15  Yes Ezekiel Slocumb, PA-C  fluticasone (FLONASE) 50 MCG/ACT nasal spray Place 2 sprays into both nostrils daily. 02/28/16  Yes Wardell Honour, MD  glimepiride (AMARYL) 4 MG tablet Take 2 tabs (8 mg) po twice daily. 06/11/15  Yes Wardell Honour, MD  glimepiride (AMARYL) 4 MG tablet TAKE TWO TABLETS BY MOUTH TWICE DAILY 04/13/16  Yes Wardell Honour, MD  hydrochlorothiazide (HYDRODIURIL) 25 MG tablet Take 1 tablet (25 mg total) by mouth daily. 03/23/15  Yes Wardell Honour, MD  lisinopril (PRINIVIL,ZESTRIL) 10 MG tablet Take 1 tablet (10 mg total) by mouth daily. 11/01/15  Yes Ezekiel Slocumb, PA-C  loratadine (CLARITIN) 10 MG tablet Take 1 tablet (10 mg total) by mouth daily. 02/28/16  Yes Wardell Honour, MD  medroxyPROGESTERone (PROVERA) 10 MG tablet Take one trablet daily for 10 days of the month if no spontaneous menses every 30 days IF negative home UPT 05/23/15  Yes Terrance Mass, MD  meloxicam (MOBIC) 15 MG tablet Take 1 tablet (15 mg total) by mouth daily. 02/28/16   Yes Wardell Honour, MD  metFORMIN (GLUCOPHAGE) 500 MG tablet Take 2 tabs by mouth (1000 mg ) 2 times daily. 06/29/15  Yes Wardell Honour, MD  metoprolol succinate (TOPROL-XL) 25 MG 24 hr tablet Take 1 tablet (25 mg total) by mouth daily. 08/12/15  Yes Wardell Honour, MD  nystatin cream (MYCOSTATIN) Apply 1 application topically 2 (two) times daily. 12/22/15  Yes Wardell Honour, MD  saxagliptin HCl (ONGLYZA) 5 MG TABS tablet Take 1 tablet (5 mg total) by mouth daily. 03/31/15  Yes Wardell Honour, MD  valACYclovir (VALTREX) 500 MG tablet Take 1 tablet (500 mg total) by  mouth 2 (two) times daily. 04/10/16  Yes Wendie Agreste, MD   Social History   Social History  . Marital Status: Married    Spouse Name: N/A  . Number of Children: 2  . Years of Education: N/A   Occupational History  . Not on file.   Social History Main Topics  . Smoking status: Never Smoker   . Smokeless tobacco: Never Used  . Alcohol Use: 0.0 oz/week    0 Standard drinks or equivalent per week     Comment: socially  . Drug Use: No  . Sexual Activity:    Partners: Male    Birth Control/ Protection: None   Other Topics Concern  . Not on file   Social History Narrative   Marital status: married x 1 year; from [place].  Moved to [place] since 2000.       Children:  2 children (22, 22); one grandchildren (5)      Lives:  With husband      Employment:  [employer] full time;       Tobacco: none      Alcohol: social      Drugs: none      Exercise: none   No reported caffeine use      Depression screen [facility] 2/9 04/16/2016 04/10/2016 02/28/2016 01/29/2016 12/22/2015  Decreased Interest 0 0 0 0 0  Down, Depressed, Hopeless 1 0 0 0 0  PHQ - 2 Score 1 0 0 0 0  Altered sleeping - - - - -  Tired, decreased energy - - - - -  Change in appetite - - - - -  Feeling bad or failure about yourself  - - - - -  Trouble concentrating - - - - -  Moving slowly or fidgety/restless - - - - -  Suicidal thoughts - - - - -  PHQ-9 Score - - - - -     Review of Systems  Genitourinary: Positive for vaginal discharge and vaginal pain.  Musculoskeletal: Positive for myalgias.   Objective:  BP 119/82 mmHg  Pulse 95  Temp(Src) 98.1 F (36.7 C) (Oral)  Resp 16  Ht 5\' 2"  (1.575 m)  Wt 285 lb (129.275 kg)  BMI 52.11 kg/m2  SpO2 98%  LMP 03/24/2016  Physical Exam  Constitutional: She appears well-developed and well-nourished. No distress.  HENT:  Head: Normocephalic and atraumatic.  Eyes: Conjunctivae are normal.  Neck: Neck supple.  Cardiovascular: Normal rate.   Pulses:      Dorsalis pedis pulses are 2+ on the right side, and 2+ on the left side.       Posterior tibial pulses are 2+ on the left side.  Pulmonary/Chest: Effort normal.  Genitourinary:  A copious amount of moist erythematous masserated tissue with pin point white vesicals vs villi covering an area inbetween mons pubis pannus and in inguinal creases over perineum. Inner labia and vaginal introitus with bright erythema  Vaginal opening with a copious amount if white Unable to view cervix due to body inhabitous.  Musculoskeletal:  Diffuse severe pain over spinus process and TTP. Severe pain over the right SI joint Positive pain on the left greater trochanter, not on the right. Negative straight leg raise bilaterally.  Neurological: She is alert.  Strength 5/5 in her lower extremities  Skin: Skin is warm and dry.  Psychiatric: She has a normal mood and affect. Her behavior is normal.  Nursing note and vitals reviewed.  Results for orders placed or performed  in visit on 04/16/16  POCT Wet + KOH Prep  Result Value Ref Range   Yeast by KOH Present Present, Absent   Yeast by wet prep Present Present, Absent   WBC by wet prep None None, Few, Too numerous to count   Clue Cells Wet Prep HPF POC Moderate (A) None, Too numerous to count   Trich by wet prep Absent Present, Absent   Bacteria Wet Prep HPF POC Many (A) None, Few, Too numerous to count   Epithelial Cells  By [employer] ([facility]) Few None, Few, Too numerous to count   RBC,UR,HPF,POC None None RBC/hpf  POCT Skin KOH  Result Value Ref Range   Skin KOH, POC Positive   POCT urinalysis dipstick  Result Value Ref Range   Color, UA yellow yellow   Clarity, UA cloudy (A) clear   Glucose, UA >=1,000 (A) negative   Bilirubin, UA negative negative   Ketones, POC UA negative negative   Spec Grav, UA 1.015    Blood, UA trace-lysed (A) negative   pH, UA 7.0    Protein Ur, POC negative negative   Urobilinogen, UA 0.2    Nitrite, UA Negative Negative   Leukocytes, UA small (1+) (A) Negative  POCT Microscopic Urinalysis ([facility])  Result Value Ref Range   WBC,UR,HPF,POC Moderate (A) None WBC/hpf   RBC,UR,HPF,POC Too numerous to count  (A) None RBC/hpf   Bacteria Moderate (A) None, Too numerous to count   Mucus Absent Absent   Epithelial Cells, UR Per Microscopy Many (A) None, Too numerous to count cells/hpf  POCT glucose (manual entry)  Result Value Ref Range   POC Glucose 102 (A) 70 - 99 mg/dl    Assessment & Plan:   1. Vaginitis and vulvovaginitis - due to candidiasis.  2. Perineal pain in female   3. Acute low back pain with radicular symptoms, duration less than 6 weeks - no improvement with meloxicam and could not tolerate flexeril 5. Couldn't get amrix w/ coupon so d/c prior meds and try diclofenac with prn robaxin.  Refer to PT.  Sxs have been present for 3 weeks so if no improvement at next OV may want to consider additional imaging (MRI) vs specialty referral.  Recheck in 1-2 wks.  Pt upset that her job has a lot of heavy lifting and no light duty options, she has not been then for over a yr so cannot get FMLA. They are keeping her out of work until she has no restrictions but might fire her if she cant get back soon - pt having such severe pain on exam I can't imaging that she will get any improvement if she returned to heavy lifting before sx improvement but ok to defer to her judgement.   4.  Uncontrolled type 2 diabetes mellitus without complication, without long-term current use of insulin ([place]) - follows with PCP Dr. Tamala Julian here at [facility]. Refilled amaryl.  5. Morbid obesity due to excess calories ([facility]) - root cause of pt's back pain and candidiasis in skin folds which I did discuss w/ pt.  6. 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 Candidiasis of perineum - SEVERE - reviewed hygeine, importance of keeping dry, use powder during day, expose to air at night, cover orally with 2 wk course of fluconazole  8. Muscle cramp, nocturnal - no improvement with flexeril, check labs, try robaxin (amrix coupon wouldn't go through at pharmacy)  9.  BV - flagyl  Orders Placed This Encounter  Procedures  . GC/Chlamydia Probe Amp    Order Specific Question:  Source    Answer:  vaginitis  . Trichomonas vaginalis, RNA  . Urine culture  . Comprehensive metabolic panel  . CK  . TSH  . Magnesium  . Ambulatory referral to Physical Therapy    Referral Priority:  Routine    Referral Type:  Physical Medicine    Referral Reason:  Specialty Services Required    Requested Specialty:  Physical Therapy    Number of Visits Requested:  1  . POCT Wet + KOH Prep  . POCT Skin KOH  . POCT urinalysis dipstick  . POCT Microscopic Urinalysis ([facility])  . POCT glucose (manual entry)    Meds ordered this encounter  Medications  . metroNIDAZOLE (FLAGYL) 500 MG tablet    Sig: Take 1 tablet (500 mg total) by mouth 2 (two) times daily.    Dispense:  14 tablet    Refill:  0  . fluconazole (DIFLUCAN) 150 MG tablet    Sig: Take 1 tablet (150 mg total) by mouth daily. Take 2 tabs on day 1    Dispense:  15 tablet    Refill:  0  . nystatin-triamcinolone ointment (MYCOLOG)    Sig: Apply 1 application topically 2 (two) times daily.    Dispense:  60 g    Refill:  1  . nystatin (MYCOSTATIN/NYSTOP) 100000 UNIT/GM POWD    Sig: Apply 500,000 g topically 2  (two) times daily.    Dispense:  180 g    Refill:  2  . diclofenac (VOLTAREN) 75 MG EC tablet    Sig: Take 1 tablet (75 mg total) by mouth 2 (two) times daily as needed for mild pain.    Dispense:  60 tablet    Refill:  0  . DISCONTD: cyclobenzaprine (AMRIX) 30 MG 24 hr capsule    Sig: Take 1 capsule (30 mg total) by mouth at bedtime.    Dispense:  30 capsule    Refill:  0  . glimepiride (AMARYL) 4 MG tablet    Sig: Take 2 tablets (8 mg total) by mouth 2 (two) times daily.    Dispense:  360 tablet    Refill:  0  . methocarbamol (ROBAXIN) 750 MG tablet    Sig: Take 1 tablet (750 mg total) by mouth every 6 (six) hours as needed for muscle spasms.    Dispense:  60 tablet    Refill:  0    I personally performed the services described in this documentation, which was scribed in my presence. The recorded information has been reviewed and considered, and addended by me as needed.   Delman Cheadle, M.D.  [facility] [address] [place], [place] [address] [phone] phone [phone] fax  04/16/2016 8:13 PM

## 2016-04-17 ENCOUNTER — Encounter: Payer: Self-pay | Admitting: Family Medicine

## 2016-04-17 LAB — GC/CHLAMYDIA PROBE AMP
CT Probe RNA: NOT DETECTED
GC Probe RNA: NOT DETECTED

## 2016-04-17 LAB — URINE CULTURE: Colony Count: >=100000

## 2016-04-18 ENCOUNTER — Ambulatory Visit (INDEPENDENT_AMBULATORY_CARE_PROVIDER_SITE_OTHER): Payer: BLUE CROSS/BLUE SHIELD | Admitting: Family Medicine

## 2016-04-18 VITALS — BP 118/76 | HR 83 | Temp 98.2°F | Resp 16 | Ht 62.0 in

## 2016-04-18 DIAGNOSIS — N390 Urinary tract infection, site not specified: Secondary | ICD-10-CM

## 2016-04-18 DIAGNOSIS — S39012D Strain of muscle, fascia and tendon of lower back, subsequent encounter: Secondary | ICD-10-CM

## 2016-04-18 DIAGNOSIS — E669 Obesity, unspecified: Secondary | ICD-10-CM

## 2016-04-18 DIAGNOSIS — B373 Candidiasis of vulva and vagina: Secondary | ICD-10-CM | POA: Diagnosis not present

## 2016-04-18 DIAGNOSIS — B3731 Acute candidiasis of vulva and vagina: Secondary | ICD-10-CM

## 2016-04-18 LAB — TRICHOMONAS VAGINALIS, PROBE AMP: TRICHOMONAS VAGINALIS PROBE APTIMA: NEGATIVE

## 2016-04-18 NOTE — Progress Notes (Signed)
Subjective:    Patient ID: [name], female    DOB: 04-20-1976, 40 y.o.   MRN: [id number]  04/18/2016  Follow-up   HPI This 40 y.o. female presents for surgical evaluation.    Lower back pain: returned 04/16/2016 for follow-up for pain; light duty restrictions provided due to ongoing lwoer back pain.  Onset of lower back pain one week ago; on day of onset, lifted three pallets alone; lifts a lot at work.  Frequently lifts 60 pounds.  Some carts need repair; must really push with a lot of pressure/weight.  Carts need maintenance.  Evaluated by Dr. Carlota Raspberry on 04/16/2016.  Provided note two days ago; unable to work three days last week; no light duty available.  Fine not to work for three days.  Supervisor wanted to send patient home but HR insisted that patient stay at work on light duty.  Had to walk excessively throughout the day.  Today was asked to lift forty pounds.  Coworkers bullying patient about light duty.  Lower back pain has improved; the R lateral leg burning is most bothersome symptoms.  Candidiasis: took Diflucan rx.  Has Metronidazole bid as well.   Obesity: has attended seminar for [place].  Must complete paperwork for six month monitoring by PCP which as been completed already in the past month; PCP must complete paperwork and provider letter of medical necessity to [facility].   Review of Systems  Constitutional: Negative for fever, chills, diaphoresis and fatigue.  Eyes: Negative for visual disturbance.  Respiratory: Negative for cough and shortness of breath.   Cardiovascular: Negative for chest pain, palpitations and leg swelling.  Gastrointestinal: Negative for nausea, vomiting, abdominal pain, diarrhea and constipation.  Endocrine: Negative for cold intolerance, heat intolerance, polydipsia, polyphagia and polyuria.  Genitourinary: Positive for vaginal discharge and vaginal pain. Negative for dysuria, urgency, frequency,  hematuria, flank pain, decreased urine volume, vaginal bleeding, genital sores, menstrual problem and pelvic pain.  Musculoskeletal: Positive for back pain.  Neurological: Negative for dizziness, tremors, seizures, syncope, facial asymmetry, speech difficulty, weakness, light-headedness, numbness and headaches.    Past Medical History  Diagnosis Date  . H. pylori infection     dx  09-02-2014-- treated w/ antibiotic  . GERD (gastroesophageal reflux disease)   . Type 2 diabetes mellitus ([place])   . Carpal tunnel syndrome, bilateral   . PCOS (polycystic ovarian syndrome)   . Menorrhagia   . Irregular menstrual cycle   . Hypertension   . Depression    Past Surgical History  Procedure Laterality Date  . Cesarean section  1995  . Carpal tunnel release Bilateral 10/07/2014    Procedure: CARPAL TUNNEL RELEASE BILATERAL;  Surgeon: [name], MD;  Location: [facility];  Service: Orthopedics;  Laterality: Bilateral;   Allergies  Allergen Reactions  . 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 Shrimp [Shellfish Allergy] Anaphylaxis    Social History   Social History  . Marital Status: Married    Spouse Name: N/A  . Number of Children: 2  . Years of Education: N/A   Occupational History  . Not on file.   Social History Main Topics  . Smoking status: Never Smoker   . Smokeless tobacco: Never Used  . Alcohol Use: 0.0 oz/week    0 Standard drinks or equivalent per week     Comment: socially  . Drug Use: No  . Sexual Activity:    Partners: Male    Birth Control/  Protection: None   Other Topics Concern  . Not on file   Social History Narrative   Marital status: married x 1 year; from [place].  Moved to [place] since 2000.       Children:  2 children (22, 22); one grandchildren (5)      Lives:  With husband      Employment:  [employer] full time;       Tobacco: none      Alcohol: social      Drugs: none      Exercise: none   No reported caffeine use    Family  History  Problem Relation Age of Onset  . Heart disease Mother 81    CAD  . Diabetes Mother   . Hypertension Mother   . Cancer Father 13    prostate cancer  . Diabetes Father   . Thyroid disease Neg Hx        Objective:    BP 118/76 mmHg  Pulse 83  Temp(Src) 98.2 F (36.8 C) (Oral)  Resp 16  Ht 5\' 2"  (1.575 m)  SpO2 97%  LMP 03/24/2016 Physical Exam  Constitutional: She is oriented to person, place, and time. She appears well-developed and well-nourished. No distress.  HENT:  Head: Normocephalic and atraumatic.  Right Ear: External ear normal.  Left Ear: External ear normal.  Nose: Nose normal.  Mouth/Throat: Oropharynx is clear and moist.  Eyes: Conjunctivae and EOM are normal. Pupils are equal, round, and reactive to light.  Neck: Normal range of motion. Neck supple. Carotid bruit is not present. No thyromegaly present.  Cardiovascular: Normal rate, regular rhythm, normal heart sounds and intact distal pulses.  Exam reveals no gallop and no friction rub.   No murmur heard. Pulmonary/Chest: Effort normal and breath sounds normal. She has no wheezes. She has no rales.  Abdominal: Soft. Bowel sounds are normal. She exhibits no distension and no mass. There is no tenderness. There is no rebound and no guarding.  Musculoskeletal:       Lumbar back: She exhibits decreased range of motion, pain and spasm. She exhibits no tenderness, no bony tenderness and normal pulse.  Lymphadenopathy:    She has no cervical adenopathy.  Neurological: She is alert and oriented to person, place, and time. No cranial nerve deficit.  Skin: Skin is warm and dry. No rash noted. She is not diaphoretic. No erythema. No pallor.  Psychiatric: She has a normal mood and affect. Her behavior is normal.   Results for orders placed or performed in visit on 04/16/16  GC/Chlamydia Probe Amp  Result Value Ref Range   CT Probe RNA NOT DETECTED    GC Probe RNA NOT DETECTED   Trichomonas vaginalis, RNA    Result Value Ref Range   T vaginalis RNA Negative   Urine culture  Result Value Ref Range   Colony Count >=100,000 COLONIES/ML    Organism ID, Bacteria GROUP B STREP (S.AGALACTIAE) ISOLATED   Comprehensive metabolic panel  Result Value Ref Range   Sodium 136 135 - 146 mmol/L   Potassium 4.0 3.5 - 5.3 mmol/L   Chloride 100 98 - 110 mmol/L   CO2 27 20 - 31 mmol/L   Glucose, Bld 102 (H) 65 - 99 mg/dL   BUN 11 7 - 25 mg/dL   Creat 0.55 0.50 - 1.10 mg/dL   Total Bilirubin 0.4 0.2 - 1.2 mg/dL   Alkaline Phosphatase 75 33 - 115 U/L   AST 14 10 - 30  U/L   ALT 24 6 - 29 U/L   Total Protein 7.6 6.1 - 8.1 g/dL   Albumin 4.2 3.6 - 5.1 g/dL   Calcium 9.4 8.6 - 10.2 mg/dL  CK  Result Value Ref Range   Total CK 48 7 - 177 U/L  TSH  Result Value Ref Range   TSH 3.57 mIU/L  Magnesium  Result Value Ref Range   Magnesium 2.1 1.5 - 2.5 mg/dL  POCT Wet + KOH Prep  Result Value Ref Range   Yeast by KOH Present Present, Absent   Yeast by wet prep Present Present, Absent   WBC by wet prep None None, Few, Too numerous to count   Clue Cells Wet Prep HPF POC Moderate (A) None, Too numerous to count   Trich by wet prep Absent Present, Absent   Bacteria Wet Prep HPF POC Many (A) None, Few, Too numerous to count   Epithelial Cells By [employer] ([facility]) Few None, Few, Too numerous to count   RBC,UR,HPF,POC None None RBC/hpf  POCT Skin KOH  Result Value Ref Range   Skin KOH, POC Positive   POCT urinalysis dipstick  Result Value Ref Range   Color, UA yellow yellow   Clarity, UA cloudy (A) clear   Glucose, UA >=1,000 (A) negative   Bilirubin, UA negative negative   Ketones, POC UA negative negative   Spec Grav, UA 1.015    Blood, UA trace-lysed (A) negative   pH, UA 7.0    Protein Ur, POC negative negative   Urobilinogen, UA 0.2    Nitrite, UA Negative Negative   Leukocytes, UA small (1+) (A) Negative  POCT Microscopic Urinalysis ([facility])  Result Value Ref Range   WBC,UR,HPF,POC Moderate (A)  None WBC/hpf   RBC,UR,HPF,POC Too numerous to count  (A) None RBC/hpf   Bacteria Moderate (A) None, Too numerous to count   Mucus Absent Absent   Epithelial Cells, UR Per Microscopy Many (A) None, Too numerous to count cells/hpf  POCT glucose (manual entry)  Result Value Ref Range   POC Glucose 102 (A) 70 - 99 mg/dl       Assessment & Plan:   1. Lumbar strain, subsequent encounter   2. Candidiasis of vulva and vagina   3. UTI (lower urinary tract infection)   4. Obesity    -improving with lumbar strain and candidiasis. -will start treatment for UTI today. -s/p six month trial of monthly monitoring by myself with weight loss attempts; will complete documentation  No orders of the defined types were placed in this encounter.   No orders of the defined types were placed in this encounter.    No Follow-up on file.    Nivia Gervase Elayne Guerin, M.D. [facility] [address] [place], [place]  [address] [phone] phone [phone] fax

## 2016-04-18 NOTE — Patient Instructions (Signed)
     IF you received an x-ray today, you will receive an invoice from [facility]. Please contact [facility] at [phone] with questions or concerns regarding your invoice.   IF you received labwork today, you will receive an invoice from [facility]. Please contact [facility] at [phone] with questions or concerns regarding your invoice.   Our billing staff will not be able to assist you with questions regarding bills from these companies.  You will be contacted with the lab results as soon as they are available. The fastest way to get your results is to activate your My Chart account. Instructions are located on the last page of this paperwork. If you have not heard from us regarding the results in 2 weeks, please contact this office.      

## 2016-04-20 MED ORDER — AMOXICILLIN 500 MG PO CAPS: 500.0000 mg | ORAL_CAPSULE | Freq: Three times a day (TID) | ORAL | Status: DC

## 2016-04-20 NOTE — Addendum Note (Signed)
Addended by: Delman Cheadle on: 04/20/2016 01:51 PM   Modules accepted: Orders

## 2016-04-21 ENCOUNTER — Ambulatory Visit (INDEPENDENT_AMBULATORY_CARE_PROVIDER_SITE_OTHER): Payer: BLUE CROSS/BLUE SHIELD | Admitting: Family Medicine

## 2016-04-21 VITALS — BP 110/74 | HR 86 | Temp 98.4°F | Resp 16 | Ht 62.0 in | Wt 280.6 lb

## 2016-04-21 DIAGNOSIS — N390 Urinary tract infection, site not specified: Secondary | ICD-10-CM

## 2016-04-21 DIAGNOSIS — B373 Candidiasis of vulva and vagina: Secondary | ICD-10-CM | POA: Diagnosis not present

## 2016-04-21 DIAGNOSIS — S39012D Strain of muscle, fascia and tendon of lower back, subsequent encounter: Secondary | ICD-10-CM

## 2016-04-21 DIAGNOSIS — B3731 Acute candidiasis of vulva and vagina: Secondary | ICD-10-CM

## 2016-04-21 NOTE — Progress Notes (Signed)
Subjective:    Patient ID: [name], female    DOB: 1976-08-20, 40 y.o.   MRN: [id number]  04/21/2016  Follow-up   HPI This 40 y.o. female presents for follow-up of the following:    1.  Lumbar strain:  Placed on light duty after last visit.  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  This week sent to pain room and lifted pain gallons all day.  Had to walk excessively for the last two days.  Lower back pain worsened.  Still having burning sensation in B thighs laterally.  Lower back pain has improved yet burning is most significant.  No pain below knee B.  Burning with numbness.  Normal bowel and bladder sensation.  Constipated.  No saddle paresthesias.   2.  Candidiasis vaginitis: improving.  Feeling better. With washing, still have area of pain.  Husband has been cleaning area.  Fissures present.  Still applying cream bid.  Powder applying bid.      Review of Systems  Constitutional: Negative for fever, chills, diaphoresis and fatigue.  Eyes: Negative for visual disturbance.  Respiratory: Negative for cough and shortness of breath.   Cardiovascular: Negative for chest pain, palpitations and leg swelling.  Gastrointestinal: Negative for nausea, vomiting, abdominal pain, diarrhea and constipation.  Endocrine: Negative for cold intolerance, heat intolerance, polydipsia, polyphagia and polyuria.  Genitourinary: Negative for decreased urine volume, vaginal discharge, difficulty urinating and vaginal pain.  Musculoskeletal: Positive for myalgias and back pain. Negative for neck pain and neck stiffness.  Neurological: Positive for numbness. Negative for dizziness, tremors, seizures, syncope, facial asymmetry, speech difficulty, weakness, light-headedness and headaches.    Past Medical History  Diagnosis Date  . H. pylori infection     dx  09-02-2014-- treated w/ antibiotic  . GERD (gastroesophageal reflux disease)   . Type 2 diabetes mellitus ([employer])   . Carpal tunnel  syndrome, bilateral   . PCOS (polycystic ovarian syndrome)   . Menorrhagia   . Irregular menstrual cycle   . Hypertension   . Depression    Past Surgical History  Procedure Laterality Date  . Cesarean section  1995  . Carpal tunnel release Bilateral 10/07/2014    Procedure: CARPAL TUNNEL RELEASE BILATERAL;  Surgeon: [name], MD;  Location: [facility];  Service: Orthopedics;  Laterality: Bilateral;   Allergies  Allergen Reactions  . 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 Shrimp [Shellfish Allergy] Anaphylaxis   Current Outpatient Prescriptions  Medication Sig Dispense Refill  . amoxicillin (AMOXIL) 500 MG capsule Take 1 capsule (500 mg total) by mouth 3 (three) times daily. 30 capsule 0  . canagliflozin (INVOKANA) 100 MG TABS tablet Take 1 tablet (100 mg total) by mouth daily. 30 tablet 5  . clindamycin (CLINDAGEL) 1 % gel Apply topically 2 (two) times daily. 30 g 0  . clobetasol cream (TEMOVATE) [id number] % APPLY ONE APPLICATION TOPICALLY TWO TIMES DAILY FOR 10 DAYS 60 g 0  . diclofenac (VOLTAREN) 75 MG EC tablet Take 1 tablet (75 mg total) by mouth 2 (two) times daily as needed for mild pain. 60 tablet 0  . EPINEPHrine (EPIPEN 2-PAK) 0.3 mg/0.3 mL IJ SOAJ injection Inject 0.3 mLs (0.3 mg total) into the muscle once. 1 Device 2  . fluconazole (DIFLUCAN) 150 MG tablet Take 1 tablet (150 mg total) by mouth daily. Take 2 tabs on day 1 15 tablet 0  . FLUoxetine (PROZAC) 20 MG tablet Take 2 tablets (40 mg total) by mouth  daily. 60 tablet 5  . fluticasone (FLONASE) 50 MCG/ACT nasal spray Place 2 sprays into both nostrils daily. 16 g 12  . glimepiride (AMARYL) 4 MG tablet Take 2 tablets (8 mg total) by mouth 2 (two) times daily. 360 tablet 0  . hydrochlorothiazide (HYDRODIURIL) 25 MG tablet Take 1 tablet (25 mg total) by mouth daily. 90 tablet 3  . lisinopril (PRINIVIL,ZESTRIL) 10 MG tablet Take 1 tablet (10 mg total) by mouth daily. 90 tablet 3  . loratadine (CLARITIN) 10  MG tablet Take 1 tablet (10 mg total) by mouth daily. 90 tablet 3  . medroxyPROGESTERone (PROVERA) 10 MG tablet Take one trablet daily for 10 days of the month if no spontaneous menses every 30 days IF negative home UPT 30 tablet 4  . metFORMIN (GLUCOPHAGE) 500 MG tablet Take 2 tabs by mouth (1000 mg ) 2 times daily. 120 tablet 11  . methocarbamol (ROBAXIN) 750 MG tablet Take 1 tablet (750 mg total) by mouth every 6 (six) hours as needed for muscle spasms. 60 tablet 0  . metoprolol succinate (TOPROL-XL) 25 MG 24 hr tablet Take 1 tablet (25 mg total) by mouth daily. 90 tablet 3  . metroNIDAZOLE (FLAGYL) 500 MG tablet Take 1 tablet (500 mg total) by mouth 2 (two) times daily. 14 tablet 0  . nystatin (MYCOSTATIN/NYSTOP) 100000 UNIT/GM POWD Apply 500,000 g topically 2 (two) times daily. 180 g 2  . nystatin cream (MYCOSTATIN) Apply 1 application topically 2 (two) times daily. 30 g 0  . nystatin-triamcinolone ointment (MYCOLOG) Apply 1 application topically 2 (two) times daily. 60 g 1  . saxagliptin HCl (ONGLYZA) 5 MG TABS tablet Take 1 tablet (5 mg total) by mouth daily. 30 tablet 11  . valACYclovir (VALTREX) 500 MG tablet Take 1 tablet (500 mg total) by mouth 2 (two) times daily. 6 tablet 2   No current facility-administered medications for this visit.   Social History   Social History  . Marital Status: Married    Spouse Name: N/A  . Number of Children: 2  . Years of Education: N/A   Occupational History  . Not on file.   Social History Main Topics  . Smoking status: Never Smoker   . Smokeless tobacco: Never Used  . Alcohol Use: 0.0 oz/week    0 Standard drinks or equivalent per week     Comment: socially  . Drug Use: No  . Sexual Activity:    Partners: Male    Birth Control/ Protection: None   Other Topics Concern  . Not on file   Social History Narrative   Marital status: married x 1 year; from [place].  Moved to [place] since 2000.       Children:  2 children (22, 22); one  grandchildren (5)      Lives:  With husband      Employment:  [employer] full time;       Tobacco: none      Alcohol: social      Drugs: none      Exercise: none   No reported caffeine use    Family History  Problem Relation Age of Onset  . Heart disease Mother 34    CAD  . Diabetes Mother   . Hypertension Mother   . Cancer Father 49    prostate cancer  . Diabetes Father   . Thyroid disease Neg Hx        Objective:    BP 110/74 mmHg  Pulse 86  Temp(Src) 98.4 F (36.9 C) (Oral)  Resp 16  Ht 5\' 2"  (1.575 m)  Wt 280 lb 9.6 oz (127.279 kg)  BMI 51.31 kg/m2  SpO2 97%  LMP 03/24/2016 Physical Exam  Constitutional: She is oriented to person, place, and time. She appears well-developed and well-nourished. No distress.  HENT:  Head: Normocephalic and atraumatic.  Right Ear: External ear normal.  Left Ear: External ear normal.  Nose: Nose normal.  Mouth/Throat: Oropharynx is clear and moist.  Eyes: Conjunctivae and EOM are normal. Pupils are equal, round, and reactive to light.  Neck: Normal range of motion. Neck supple. Carotid bruit is not present. No thyromegaly present.  Cardiovascular: Normal rate, regular rhythm, normal heart sounds and intact distal pulses.  Exam reveals no gallop and no friction rub.   No murmur heard. Pulmonary/Chest: Effort normal and breath sounds normal. She has no wheezes. She has no rales.  Abdominal: Soft. Bowel sounds are normal. She exhibits no distension and no mass. There is no tenderness. There is no rebound and no guarding.  Genitourinary: There is rash on the right labia. There is no tenderness or lesion on the right labia. There is rash on the left labia. There is no tenderness or lesion on the left labia.  Musculoskeletal:       Lumbar back: She exhibits pain and spasm. She exhibits normal range of motion, no tenderness, no bony tenderness and normal pulse.  Lumbar spine:  Non-tender midline; non-tender paraspinal regions B.  Straight leg  raises negative B; toe and heel walking intact; marching intact; motor 5/5 BLE.  Full ROM lumbar spine without limitation yet pain reproduced.   Lymphadenopathy:    She has no cervical adenopathy.  Neurological: She is alert and oriented to person, place, and time. No cranial nerve deficit.  Skin: Skin is warm and dry. No rash noted. She is not diaphoretic. No erythema. No pallor.  Psychiatric: She has a normal mood and affect. Her behavior is normal.   Results for orders placed or performed in visit on 04/16/16  GC/Chlamydia Probe Amp  Result Value Ref Range   CT Probe RNA NOT DETECTED    GC Probe RNA NOT DETECTED   Trichomonas vaginalis, RNA  Result Value Ref Range   T vaginalis RNA Negative   Urine culture  Result Value Ref Range   Colony Count >=100,000 COLONIES/ML    Organism ID, Bacteria GROUP B STREP (S.AGALACTIAE) ISOLATED   Comprehensive metabolic panel  Result Value Ref Range   Sodium 136 135 - 146 mmol/L   Potassium 4.0 3.5 - 5.3 mmol/L   Chloride 100 98 - 110 mmol/L   CO2 27 20 - 31 mmol/L   Glucose, Bld 102 (H) 65 - 99 mg/dL   BUN 11 7 - 25 mg/dL   Creat 0.55 0.50 - 1.10 mg/dL   Total Bilirubin 0.4 0.2 - 1.2 mg/dL   Alkaline Phosphatase 75 33 - 115 U/L   AST 14 10 - 30 U/L   ALT 24 6 - 29 U/L   Total Protein 7.6 6.1 - 8.1 g/dL   Albumin 4.2 3.6 - 5.1 g/dL   Calcium 9.4 8.6 - 10.2 mg/dL  CK  Result Value Ref Range   Total CK 48 7 - 177 U/L  TSH  Result Value Ref Range   TSH 3.57 mIU/L  Magnesium  Result Value Ref Range   Magnesium 2.1 1.5 - 2.5 mg/dL  POCT Wet + KOH Prep  Result Value Ref Range  Yeast by KOH Present Present, Absent   Yeast by wet prep Present Present, Absent   WBC by wet prep None None, Few, Too numerous to count   Clue Cells Wet Prep HPF POC Moderate (A) None, Too numerous to count   Trich by wet prep Absent Present, Absent   Bacteria Wet Prep HPF POC Many (A) None, Few, Too numerous to count   Epithelial Cells By [employer] ([facility]) Few  None, Few, Too numerous to count   RBC,UR,HPF,POC None None RBC/hpf  POCT Skin KOH  Result Value Ref Range   Skin KOH, POC Positive   POCT urinalysis dipstick  Result Value Ref Range   Color, UA yellow yellow   Clarity, UA cloudy (A) clear   Glucose, UA >=1,000 (A) negative   Bilirubin, UA negative negative   Ketones, POC UA negative negative   Spec Grav, UA 1.015    Blood, UA trace-lysed (A) negative   pH, UA 7.0    Protein Ur, POC negative negative   Urobilinogen, UA 0.2    Nitrite, UA Negative Negative   Leukocytes, UA small (1+) (A) Negative  POCT Microscopic Urinalysis ([facility])  Result Value Ref Range   WBC,UR,HPF,POC Moderate (A) None WBC/hpf   RBC,UR,HPF,POC Too numerous to count  (A) None RBC/hpf   Bacteria Moderate (A) None, Too numerous to count   Mucus Absent Absent   Epithelial Cells, UR Per Microscopy Many (A) None, Too numerous to count cells/hpf  POCT glucose (manual entry)  Result Value Ref Range   POC Glucose 102 (A) 70 - 99 mg/dl       Assessment & Plan:   1. Lumbar strain, subsequent encounter   2. Candidiasis of vulva and vagina   3. UTI (lower urinary tract infection)    -improving -continue light duty for ten more days. -anticipate return to regular duty at next visit. -continue current medications and home exercise program.   No orders of the defined types were placed in this encounter.   No orders of the defined types were placed in this encounter.    Return in about 10 days (around 05/01/2016) for recheck.    Jaan Fischel Elayne Guerin, M.D. [facility] [address] [place], [place]  [address] [phone] phone [phone] fax

## 2016-04-21 NOTE — Patient Instructions (Addendum)
IF you received an x-ray today, you will receive an invoice from [facility] Radiology. Please contact [facility] at [phone] with questions or concerns regarding your invoice.   IF you received labwork today, you will receive an invoice from [employer]. Please contact [facility] at [phone] with questions or concerns regarding your invoice.   Our billing staff will not be able to assist you with questions regarding bills from these companies.  You will be contacted with the lab results as soon as they are available. The fastest way to get your results is to activate your My Chart account. Instructions are located on the last page of this paperwork. If you have not heard from [place] regarding the results in 2 weeks, please contact this office.     Esguince de la cintura con rehabilitacin (Low Back Sprain With Rehab) Un esguince es una lesin en la que el ligamento se desgarra. Los ligamentos de la cintura son susceptibles de sufrir esguinces. Sin embargo, estos ligamentos son Orlene Erm fuertes y se requiere de una gran fuerza para lesionarlos. Son importantes para estabilizar la mdula [place] esguinces se clasifican en tres categoras. Los esguinces de grado 1 ocasionan dolor, pero el tendn no est alargado. En los esguinces de grado 2 hay un ligamento alargado, debido a un estiramiento o desgarro parcial. En el esguince de [place] 2 an se mantiene la funcin, aunque sta puede estar alterada. Un esguince en grado 3 es la ruptura completa del msculo o el tendn, y suele quedar incapacitada la funcin. SNTOMAS  Dolor intenso en la cintura.  Sensacin de estallido o ruptura en el momento de la lesin.  Sensibilidad y a veces hinchazn en la zona de la lesin.  Algunas veces, hematoma (contusin) en el lugar de la lesin dentro de las 48 horas.  Espasmos musculares en la espalda. CAUSAS El esguince se produce cuando se aplica una fuerza en el  ligamento que es mayor de lo que puede soportar. Las causas ms frecuentes de la lesin son:  Sherrye Payor actividad estresante en una posicin incmoda.  Actividades estresantes repetidas que implican movimiento de la cintura.  Golpe directo en la cintura (traumatismo). LOS RIESGOS AUMENTAN CON:  Deportes de contacto (ftbol, lucha).  Colisiones (principalmente accidentes de esqu).  Deportes que requieren arrojar o Retail banker elemento (levantamiento de pesas, bisbol).  Deportes que implican girar la columna (gimnasia, clavados, tenis, golf)  Poca fuerza y flexibilidad.  Proteccin inadecuada.  Cirugas previas en la espalda (especialmente fusin). PREVENCIN  Use el equipo protector adecuado y [employer]OK.  Precalentamiento adecuado y elongacin antes de la [place].  Descanso y recuperacin entre actividades.  Mantener la forma fsica:  Kerry Hough, flexibilidad y resistencia muscular.  Capacidad cardiovascular.  Mantenga un peso corporal adecuado. PRONSTICO Si se trata adecuadamente, estos esguinces pueden curarse con tratamiento no quirrgico. El tiempo de curacin depende de la gravedad de la lesin.  posibles complicaciones:  La recurrencia frecuente de los sntomas puede dar como resultado un problema crnico.  Inflamacin crnica y dolor en la cintura.  Retraso en la curacin o resolucin de los sntomas, en particular si se retoma la actividad rpidamente.  Discapacidad prolongada.  Articulacin inestable o artrtica en la cintura. TRATAMIENTO El tratamiento inicial incluye el uso de medicamentos y la aplicacin de hielo para reducir Conservation officer, historic buildings y la inflamacin. Los ejercicios de elongacin y fortalecimiento pueden ayudar a reducir Conservation officer, historic buildings con la [place]. Los ejercicios pueden Press photographer  o con un terapeuta. Los Apple Computer graves pueden requerir la derivacin a un fisioterapeuta para Film/video editor evaluacin y Medical laboratory scientific officer un tratamiento, como  ultrasonido. El profesional podr indicarle el uso de un dispositivo ortopdico para ayudar a Dietitian y la inflamacin. A menudo, demasiado reposo en cama podr resultar en ms daos que beneficios. Podrn prescribirle inyecciones de corticoides. Sin embargo, esto deber reservarse para los casos ms graves. Es Theatre manager uso de la espalda cuando se levantan objetos. Por la noche, se aconseja que usted [place], sobre un colchn firme y coloque una almohada debajo de las rodillas. Si no se obtiene xito con Music therapist, ser necesario someterse a [place].  MEDICAMENTOS   Si es necesaria la administracin de medicamentos para Conservation officer, historic buildings, se recomiendan los antiinflamatorios no esteroides, como aspirina e ibuprofeno y otros calmantes menores, como acetaminofeno.  No tome medicamentos para el dolor dentro de los 7 das previos a la [place].  El profesional podr prescribirle calmantes si lo considera necesario. Utilcelos como se le indique y slo cuando lo necesite.  Podr beneficiarse con Liz Claiborne.  En algunos casos se indica una inyeccin de corticosteroides. Estas inyecciones deben reservarse para los casos graves, porque slo se pueden administrar una determinada cantidad de veces. CALOR Y FRO   El fro (con hielo) debe aplicarse durante 10 a 15 minutos cada 2  3 horas para reducir la inflamacin y Conservation officer, historic buildings e inmediatamente despus de cualquier actividad que agrava los sntomas. Utilice bolsas o un masaje de hielo.  El calor puede usarse antes de Neurosurgeon y [place] fortalecimiento indicadas por el profesional, le fisioterapeuta o Industrial/product designer. Utilice una bolsa trmica o un pao hmedo. SOLICITE ATENCIN MDICA SI:   Los sntomas empeoran o no mejoran en 2 a 4 semanas, an realizando Lexicographer.  Presenta adormecimiento o debilidad en alguna de las piernas.  Prdida del control del intestino o de la  vejiga.  Luego de la ciruga observa lo siguiente: fiebre, dolor intenso, hinchazn, enrojecimiento, drena lquido o sangra en la regin de la herida.  Desarrolla nuevos e inexplicables sntomas. (Los [employer]negy utilizados en el tratamiento le ocasionan efectos secundarios). Lowes personas con dolor de espalda baja encuentran que sus sntomas empeoran al doblarse hacia adelante (flexin) o al arquear la regin inferior de la espalda (extensin). Los ejercicios que le ayudarn a Investment banker, operational sus sntomas se Furniture conservator/restorer.  El mdico, fisioterapeuta o Radiation protection practitioner ayudarn a Teacher, adult education qu ejercicios sern de ayuda para resolver su dolor de espalda. No realice ningn ejercicio sin consultarlo antes con el profesional. Discontine los ejercicios que empeoran sus sntomas, hasta que hable con el mdico. Si siente dolor, entumecimiento u hormigueo que Costco Wholesale glteos, piernas o pies, el objetivo de esta terapia es que estos sntomas se acerquen a la espalda y Occupational hygienist. A veces, estos sntomas en las piernas mejoran, pero el dolor de espalda empeora. Este suele ser un indicio de progreso en su rehabilitacin. Asegrese de que estar atento a cualquier cambio en sus sntomas y las actividades que ha General Electric 24 horas antes del cambio. Compartir esta informacin con su mdico le permitir un mejor tratamiento para tratar su enfermedad. Estos ejercicios le ayudarn en la recuperacin de la lesin. Los sntomas podrn aliviarse con o sin asistencia adicional de su mdico, fisioterapeuta o  entrenador. Al completar estos ejercicios, recuerde:   Restaurar la flexibilidad del tejido ayuda a que las articulaciones recuperen el movimiento normal. Esto permite que el movimiento y la actividad sea ms saludables y menos dolorosos.  Para que sea efectiva, cada elongacin  debe realizarse durante al menos 30 segundos.  La elongacin nunca debe ser dolorosa. Deber sentir slo un alargamiento o distensin suave del tejido que estira. EJERCICIOS DE AMPLITUD DE MOVIMIENTOS Y ELONGACIN: ELONGACION Flexin - una rodilla al pecho  Recustese en una cama dura o sobre el piso, con ambas piernas extendidas al frente.  Manteniendo una pierna en contacto con el piso, lleve la rodilla opuesta al pecho. Mantenga la pierna en esa posicin, sostenindola por la zona posterior del muslo o por la rodilla.  Presione hasta sentir un suave estiramiento en la cintura. Mantenga esta posicin durante __________ segundos.  Libere la pierna lentamente y repita el ejercicio con el lado opuesto. Reptalo __________ veces. Realice este ejercicio __________ veces por da.  ELONGACIN - Flexin, dos rodillas al pecho   Recustese en una cama dura o sobre el piso, con ambas piernas extendidas al frente.  Manteniendo una pierna en contacto con el piso, lleve la rodilla opuesta al pecho.  Tense los msculos del estmago para apoyar la espalda y levante la otra rodilla [place]. Mantenga las piernas en su lugar y tmese por detrs [place].  Con ambas rodillas en el pecho, tire hasta que sienta un estiramiento en la parte trasera de la espalda. Mantenga esta posicin durante __________ segundos.  Tense los msculos del estmago y baje las piernas de a una por vez. Reptalo __________ veces. Realice este ejercicio __________ veces por da.  ELONGACIN - Rotacin de la zona baja del tronco  Recustese sobre una cama firme o sobre el suelo. [place], doble las rodillas de modo que ambas apunten hacia el techo y los pies queden bien apoyados en el piso.  Extienda los brazos a Teaching laboratory technician. Esto estabilizar la zona superior del cuerpo, manteniendo los hombros en contacto con el piso.  Con cuidado y lentamente deje caer ambas rodillas juntas  hacia un lado, hasta que sienta un suave estiramiento en la espalda baja. Mantenga esta posicin durante __________ segundos.  Tensione los Apple Computer del estmago para Nature conservation officer la cintura mientras lleva las rodillas nuevamente a la posicin [place]. Reptalo __________ veces. Realice este ejercicio __________ veces por da. EJERCICIOS DE AMPLITUD DE MOVIMIENTOS Y FLEXIBILIDAD: ELONGACIN - Extensin posicin prona sobre los codos  Acustese sobre el estmago sobre el piso, una cama ser muy blanda. Coloque las palmas a una distancia igual al ancho de los hombres y a la altura de la cabeza.  Coloque los codos bajo los hombros. Si siente dolor, colquese almohadas debajo del pecho.  Deje que su cuerpo se relaje, de modo que las caderas queden ms abajo y tengan ms contacto con el piso.  Mantenga esta posicin durante __________ segundos.  Vuelva lentamente a la posicin plana sobre el piso. Reptalo __________ veces. Realice este ejercicio __________ veces por da.  Verona de brazos en posicin prona  Acustese sobre el [employer] piso, una cama ser [place]. Coloque las palmas a una distancia igual al ancho de los hombres y a la altura de la cabeza.  Mantenga la espalda tan relajada como pueda, enderece lentamente los codos [employer]  caderas contra el suelo. Puede modificar la posicin de las manos para estar ms cmodo. A medida que gana movimiento, sus manos quedarn ms por debajo de los hombros.  Mantenga cada posicin durante __________ segundos.  Vuelva lentamente a la posicin plana sobre el piso. Reptalo __________ veces. Realice este ejercicio __________ veces por da.  AMPLITUD DE MOVIMIENTOS - Cuadrpedo [place] vertebral neutral  [place] y las rodillas en una superficie firme. Las manos deben quedar a la altura de los hombros y las rodillas [place].  Puede colocar algo debajo las rodillas para estar ms cmodo.  Haga caer la cabeza y apunte el cccix hacia el suelo debajo de usted. De este modo se redondear la cintura, en [place] similar a un gato enojado. Mantenga esta posicin durante __________ segundos.  Lentamente levante la cabeza y afloje el cccix para que se hunda el cuerpo en un gran arco, como un caballo.  Mantenga esta posicin durante __________ segundos.  Reptalo hasta sentir calor en la cintura.  Ahora encuentre su "punto ideal". Ser la posicin ms cmoda Occidental Petroleum. En esta posicin es cuando su columna est neutral. Una vez que encuentre la posicin, tensione los msculos del estmago para sostener la zona inferior de la espalda.  Mantenga esta posicin durante __________ segundos. Reptalo __________ veces. Realice este ejercicio __________ veces por da.  EJERCICIOS DE FORTALECIMIENTO - Esguince de la cintura Estos ejercicios le ayudarn en la recuperacin de la lesin. Estos ejercicios deben hacerse cerca de su "punto dulce". Este es el arco neutro, de la parte baja de la espalda, en algn lugar entre la posicin completamente redondeada y arqueada plenamente, que es la posicin menos dolorosa. Cuando se realiza en [employer] de seguridad del movimiento, estos ejercicios se pueden Risk manager para las personas que tienen una lesin basada en flexin o extensin. Con estos ejercicios, los sntomas podrn desaparecer con o sin mayor intervencin del profesional, el fisioterapeuta o Industrial/product designer. Al completar estos ejercicios, recuerde:   Los msculos pueden ganar la resistencia y la fuerza necesarias para las actividades diarias a travs de ejercicios controlados.  Realice los ejercicios como se lo indic el mdico, el fisioterapeuta o Industrial/product designer. Aumente la resistencia y las repeticiones segn se le haya indicado.  Podr experimentar dolor o cansancio muscular, pero el dolor o molestia que  trata de eliminar a travs de los ejercicios nunca debe empeorar. Si el dolor empeora, detngase y asegrese de que est siguiendo las directivas correctamente. Si an siente dolor luego de Optometrist lo ajustes necesarios, deber discontinuar el ejercicio hasta que pueda conversar con el profesional sobre el problema. FORTALECIMIENTO - Abdominales profundos - Inclinacin plvica  Recustese sobre una cama firme o sobre el suelo. [place], doble las rodillas de modo que ambas apunten hacia el techo y los pies queden bien apoyados en el piso.  Tensione la zona baja de los msculos abdominales para presionar la Materials engineer. Este movimiento har rotar su pelvis de modo que el cccix quede hacia arriba y no apuntando a los pies o hacia el piso. Con una tensin suave y respiracin pareja, mantenga esta posicin durante __________ segundos. Reptalo __________ veces. Realice este ejercicio __________ veces por da.  FORTALECIMIENTO - Abdominales encogimiento abdominal.  Recustese sobre una cama firme o sobre el suelo. [place], doble las rodillas de modo que ambas apunten hacia el techo y los pies queden bien apoyados en  el piso. [place].  Apunte suavemente con la barbilla hacia abajo, sin doblar el cuello.  Tensione los abdominales y eleve lentamente el tronco la altura suficiente para despegar los omplatos. Si se eleva ms, pondr tensin excesiva en la cintura y esto no fortalecer ms los abdominales.  Controle la vuelta a la posicin inicial. Reptalo __________ veces. Realice este ejercicio __________ veces por da.  EN CUATRO MIEMBROS - Cuadrpedo, elevacin de miembro superior e inferior opuestos   [employer] y las rodillas en una superficie firme. Las manos deben quedar a la altura de los hombros y las rodillas [place]. Puede colocar algo debajo las rodillas para estar ms cmodo.  Encuentre la  posicin neutral de la columna vertebral y Heritage manager los msculos abdominales de modo que pueda mantener esta posicin. Los hombros y las caderas deben formar un rectngulo paralelo con el suelo y recto.  Manteniendo el tronco firme, eleve la mano derecha a la altura del hombro y luego eleve la pierna izquierda a la altura de la cadera. Asegrese de no contener la respiracin. Mantenga esta posicin durante __________ segundos.  Con los msculos abdominales en tensin y la espalda firme, vuelva lentamente a la posicin inicial. Repita con el otro brazo y la otra pierna.  Reptalo __________ veces. Realice este ejercicio __________ veces por da. FUERZA - Abdominales y cudriceps - Levantar las piernas rectas  Recustese en una cama dura o sobre el piso, con ambas piernas extendidas al frente.  Deje una pierna en contacto con el suelo y doble la otra rodilla de manera que el pie quede contra el suelo.  Encuentre la posicin neutral de la columna vertebral y Heritage manager los msculos abdominales de modo que pueda mantener esta posicin.  Levante lentamente la pierna del suelo una 6 pulgadas y cuente [place] [address], asegrese de no contener la respiracin.  Mantega la columna en posicin neutral, y baje lentamente la pierna hasta el suelo. Repita el ejercicio con cada pierna __________ veces. Realice este ejercicio __________ veces por da. CONSIDERACIONES ACERCA DE LA POSTURA Y LA MECNICA DEL CUERPO  Esguince de la cintura Si mantiene una postura correcta cuando se encuentre de pie, sentado o realizando sus actividades, reducir el estrs [employer] diferentes tejidos del cuerpo, y Advertising account executive a los tejidos lesionados la posibilidad de curarse y Engineering geologist las experiencias dolorosas. A continuacin se indican pautas generales para mejorar la postura. Su mdico o fisioterapeuta le dar instrucciones especficas segn sus necesidades. Al leer estas pautas recuerde:  Los ejercicios indicados  por su mdico lo ayudarn a Scientist, product/process development flexibilidad y la fuerza para Theatre manager las posturas correctas.  La postura correcta proporciona el mejor entorno de trabajo para las articulaciones. Las articulaciones se desgastan menos cuando estn sostenidas adecuadamente por una columna vertebral en buena postura. Esto significa que su cuerpo estar ms sano y Network engineer.  La correcta postura debe practicarse en todas las actividades, especialmente al estar sentado o de pie durante [place]. Tambin es importante al realizar actividades repetitivas de bajo estrs (tipeo) o una actividad nica y pesada. POSICIONES DE Cathe [place] Tenga en cuenta cules son las posturas que ms dolor le causan al elegir una posicin de descanso. Si siente dolor con las actividades en que deba realizar una flexin (sentarse, inclinarse, detenerse, ponerse en cuclillas), elija una posicin que le permita descansar en una postura menos flexionada. Evite curvarse en posicin fetal cuando se encuentre de lado. Si el dolor  empeora con las actividades basadas en la extensin (estar de pie durante un tiempo prolongado, trabajar con las manos por arriba de la cabeza) evite descansar en Ardelia Mems posicin extendida durante mucho tiempo, como dormir sobre el [place]. La State Farm de las Artist cmodo el descanso sobre la columna vertebral en una posicin neutral, ni muy redondeada ni [place]. Recustese sobre su lado en una cama que no est hundida con una almohada entre las rodillas o sobre la espalda con una almohada bajo las rodillas, y sentir [place]. Tenga en cuenta que cualquier posicin en General Electric, no importa si es una postura [place], puede provocarle rigidez. POSTURAS CORRECTAS PARA SENTARSE Con el fin de minimizar el estrs y Health and safety inspector en su columna, deber sentarse con la postura correcta. Sentarse con una buena postura debe ser algo sin esfuerzo para un cuerpo sano. Recuperar una buena postura es un proceso  gradual. Muchas personas pueden trabajar ms cmodas mediante el uso de diferentes soportes hasta que tengan la flexibilidad y la fuerza para mantener esta postura por su cuenta. Al sentarse con la [employer] Corporation, los odos deben estar sobre los hombros y los hombros [place]. Debe utilizar el respaldo de la silla para apoyar la espalda. La espalda estar en una posicin neutral, ligeramente arqueada. Puede colocar una pequea almohada o toalla doblada en la base de la espalda baja para apoyo.  Si trabaja en un escritorio, cree un ambiente que le proporciones un buen soporte y [place]. Sin apoyo adicional, msculos se cansan, lo que lleva a una tensin excesiva en las articulaciones y otros tejidos. Tenga en cuenta estas recomendaciones: SILLA:   La silla debe poder deslizarse por debajo del escritorio cuando su espalda tome contacto con el respaldo. Esto le permitir trabajar ms cerca.  La altura de la silla debe permitirle que los ojos tengan el nivel de la parte superior del monitor y las manos estn ms abajo que los codos. POSICIN DEL CUERPO  Los pies deben tener contacto con el piso. Si no es posible, use un posapies.  Mantenga las [employer] hombros. Esto reducir el estrs en el cuello y en la cintura. POSTURAS INCORRECTAS PARA SENTARSE Si se siente cansado e incapaz de asumir una postura sentada sana, no se eche hacia atrs. Esto pone una tensin excesiva en los tejidos de su espalda, y causa ms dao y Social research officer, government. [place] opciones ms saludables se incluyen:  El uso de ms apoyo, como una almohada lumbar.  Cambio de tareas, a algo que demande una posicin vertical o caminar.  Tomar una breve caminata.  Recostarse y Physicist, medical posicin neutral. DE PIE DURANTE UN TIEMPO PROLONGADO E INCLINADO LIGERAMENTE HACIA ADELANTE Cuando deba realizar una tarea que requiera inclinacin hacia adelante estando de pie en el mismo sitio durante mucho tiempo,  coloque un pie en un objeto de 2 a 4 pulgadas de alto, para [employer]. Cuando ambos pies estn en el piso, la zona inferior de la espalda tiene a perder su ligera curvatura hacia adentro. Si esta curva se aplana (o se pronuncia demasiado) la espalda y las articulaciones experimentarn demasiado estrs, se fatigarn ms rpidamente y Therapist, sports.  POSTURAS CORRECTAS PARA ESTAR DE PIE Una postura adecuada de pie realizarse en todas las actividades diarias, incluso si slo toman un momento, como al Mellon Financial. Como en la postura de sentado, los odos deben estar sobre los hombros y los hombros Kevin. Deber Cisco  una ligera tensin en sus msculos abdominales para asegurar la columna vertebral. El cccix debe apuntar hacia el suelo, no detrs de su cuerpo, ya que resultara en una curvatura de la espalda sobre-extendida.  [place] posturas incorrectas para estar de pie incluyen tener la cabeza hacia delante, las rodillas bloqueadas o una excesiva curvatura de la espalda. CAMINAR [place] en [place] erguida. Las [place], hombros y caderas deben estar alineados. ACTIVIDAD PROLONGADA EN UNA POSICIN FLEXIONADA Al completar una tarea que requiere que se doble la cintura hacia adelante o inclinarse sobre una superficie baja, trate de encontrar una manera de estabilizar 3 de cada 4 de sus miembros. Puede colocar una mano o el codo en el [place], o descansar una rodilla en la superficie en la que est apoyado. Esto le proporcionar ms estabilidad para que sus msculos no se cansen tan rpidamente. El [employer] Partners rodillas [place], o ligeramente dobladas, tambin reducir el estrs en la espalda baja. TCNICAS CORRECTAS PARA LEVANTAR OBJETOS SI:   Asumir una postura amplia. Esto le proporcionar ms estabilidad y la oportunidad de acercarse lo ms posible al objeto que se est levantando.  Tense los abdominales para asegurar la  columna vertebral. Doble las rodillas y las caderas. Manteniendo la espalda en una posicin neutral, haga el esfuerzo con los msculos de la pierna. Levntese con las piernas, manteniendo la espalda derecha.  Pruebe el peso de los objetos desconocidos antes de tratar de Special educational needs teacher.  Trate de [employer] Stores codos hacia abajo y a los lados, con el fin de obtener la fuerza de los hombros al llevar un objeto.  Siempre pida ayuda a otra persona cuando deba levantar objetos pesados o incmodos. TCNICAS INCORRECTAS PARA LEVANTAR OBJETOS NO:   Bloquee rodillas al levantar, aunque sea un objeto pequeo.  Se doble ni gire. Gire sobre los pies ni los mueva cuando necesite cambiar de direccin.  Considere que no puede levantar incluso un clip de papel con seguridad, sin Chiropodist.   Esta informacin no tiene Marine scientist el consejo del mdico. Asegrese de hacerle al mdico cualquier pregunta que tenga.   Document Released: 08/01/2006 Document Revised: 03/01/2015 Elsevier Interactive Patient Education [employer].

## 2016-04-29 ENCOUNTER — Other Ambulatory Visit: Payer: Self-pay | Admitting: Family Medicine

## 2016-04-30 ENCOUNTER — Telehealth: Payer: Self-pay | Admitting: *Deleted

## 2016-04-30 ENCOUNTER — Other Ambulatory Visit: Payer: Self-pay | Admitting: Family Medicine

## 2016-04-30 DIAGNOSIS — N979 Female infertility, unspecified: Secondary | ICD-10-CM

## 2016-04-30 MED ORDER — DOXYCYCLINE HYCLATE 100 MG PO CAPS: ORAL_CAPSULE | ORAL | Status: DC

## 2016-04-30 NOTE — Telephone Encounter (Signed)
Yes this will be fine. Make sure patient has prescription for Vibramycin 100 mg one by mouth twice a day for 3 days starting the day before the HSG.

## 2016-04-30 NOTE — Telephone Encounter (Signed)
Order placed, LMP:04/28/16, appointment on 05/04/16 @ 9:45am at [facility] hospital, Rx for vibramycin 100 mg. Left message for pt to call

## 2016-04-30 NOTE — Telephone Encounter (Signed)
Pt aware, Rx sent. Aware when to take Rx.

## 2016-04-30 NOTE — Telephone Encounter (Signed)
Pt called today stating her cycle started Saturday and to schedule HSG, this was discussed on 07/13/15 office visit. Annual due end of this month. Okay to proceed with scheduling HSG? Please advise

## 2016-05-04 ENCOUNTER — Ambulatory Visit (HOSPITAL_COMMUNITY)
Admission: RE | Admit: 2016-05-04 | Discharge: 2016-05-04 | Disposition: A | Discharge status: Home / Self Care | Payer: BLUE CROSS/BLUE SHIELD | Source: Ambulatory Visit | Attending: Gynecology | Admitting: Gynecology

## 2016-05-04 DIAGNOSIS — N979 Female infertility, unspecified: Secondary | ICD-10-CM | POA: Insufficient documentation

## 2016-05-04 MED ORDER — IOPAMIDOL (ISOVUE-300) INJECTION 61%
30.0000 mL | Freq: Once | INTRAVENOUS | Status: AC | PRN
  Administered 2016-05-04: 7 mL

## 2016-05-05 ENCOUNTER — Ambulatory Visit (INDEPENDENT_AMBULATORY_CARE_PROVIDER_SITE_OTHER): Payer: BLUE CROSS/BLUE SHIELD | Admitting: Family Medicine

## 2016-05-05 VITALS — BP 110/60 | HR 81 | Temp 98.4°F | Resp 18 | Wt 287.2 lb

## 2016-05-05 DIAGNOSIS — E1165 Type 2 diabetes mellitus with hyperglycemia: Secondary | ICD-10-CM

## 2016-05-05 DIAGNOSIS — S39012D Strain of muscle, fascia and tendon of lower back, subsequent encounter: Secondary | ICD-10-CM

## 2016-05-05 DIAGNOSIS — IMO0001 Reserved for inherently not codable concepts without codable children: Secondary | ICD-10-CM

## 2016-05-05 DIAGNOSIS — I10 Essential (primary) hypertension: Secondary | ICD-10-CM | POA: Diagnosis not present

## 2016-05-05 LAB — GLUCOSE, POCT (MANUAL RESULT ENTRY): POC GLUCOSE: 137 mg/dL — AB (ref 70–99)

## 2016-05-05 LAB — POCT GLYCOSYLATED HEMOGLOBIN (HGB A1C): Hemoglobin A1C: 6.8

## 2016-05-05 NOTE — Patient Instructions (Signed)
     IF you received an x-ray today, you will receive an invoice from [facility]. Please contact [facility] at [phone] with questions or concerns regarding your invoice.   IF you received labwork today, you will receive an invoice from [facility]. Please contact [facility] at [phone] with questions or concerns regarding your invoice.   Our billing staff will not be able to assist you with questions regarding bills from these companies.  You will be contacted with the lab results as soon as they are available. The fastest way to get your results is to activate your My Chart account. Instructions are located on the last page of this paperwork. If you have not heard from us regarding the results in 2 weeks, please contact this office.      

## 2016-05-05 NOTE — Progress Notes (Signed)
Subjective:    Patient ID: [name], female    DOB: 1976/10/23, 40 y.o.   MRN: [id number]  05/05/2016  Follow-up (recheck on lumbar strain (on light duty at work))   HPI This 40 y.o. female presents for ten day follow-up for lumbar strain.  Light duty. Had some mild pain yesterday. Doing very light duty all week; placed on same light duty less than lifting/pushing/pulling 20 pounds.  Grab piece by piece in 50 pound boxes; fine with light duty. Starts working 5am every day.  Took muscle relaxer one morning.  Was very sleepy at 12:00noon last week; left early.  Still taking medication daily.    Obesity: has appointment with General Surgery on 05/10/16.     S/p hysterogram yesterday WNL.    Review of Systems  Constitutional: Negative for fever, chills, diaphoresis and fatigue.  Eyes: Negative for visual disturbance.  Respiratory: Negative for cough and shortness of breath.   Cardiovascular: Negative for chest pain, palpitations and leg swelling.  Gastrointestinal: Negative for nausea, vomiting, abdominal pain, diarrhea and constipation.  Endocrine: Negative for cold intolerance, heat intolerance, polydipsia, polyphagia and polyuria.  Genitourinary: Negative for decreased urine volume and difficulty urinating.  Musculoskeletal: Positive for back pain.  Neurological: Negative for dizziness, tremors, seizures, syncope, facial asymmetry, speech difficulty, weakness, light-headedness, numbness and headaches.    Past Medical History:  Diagnosis Date  . Carpal tunnel syndrome, bilateral   . Depression   . GERD (gastroesophageal reflux disease)   . H. pylori infection    dx  09-02-2014-- treated w/ antibiotic  . Hypertension   . Irregular menstrual cycle   . Menorrhagia   . PCOS (polycystic ovarian syndrome)   . Type 2 diabetes mellitus ([place])    Past Surgical History:  Procedure Laterality Date  . CARPAL TUNNEL RELEASE Bilateral 10/07/2014   Procedure: CARPAL TUNNEL RELEASE  BILATERAL;  Surgeon: [name], MD;  Location[facility];  Service: Orthopedics;  Laterality: Bilateral;  . CESAREAN SECTION  1995   Allergies  Allergen Reactions  . 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 Shrimp [Shellfish Allergy] Anaphylaxis   Current Outpatient Prescriptions  Medication Sig Dispense Refill  . canagliflozin (INVOKANA) 100 MG TABS tablet Take 1 tablet (100 mg total) by mouth daily. 30 tablet 5  . clindamycin (CLINDAGEL) 1 % gel Apply topically 2 (two) times daily. 30 g 0  . clobetasol cream (TEMOVATE) 8.82 % APPLY ONE APPLICATION TOPICALLY TWO TIMES DAILY FOR 10 DAYS 60 g 0  . diclofenac (VOLTAREN) 75 MG EC tablet Take 1 tablet (75 mg total) by mouth 2 (two) times daily as needed for mild pain. 60 tablet 0  . doxycycline (VIBRAMYCIN) 100 MG capsule Take one tablet twice a day starting the day before procedure. (Patient not taking: Reported on 05/11/2016) 6 capsule 0  . EPINEPHrine (EPIPEN 2-PAK) 0.3 mg/0.3 mL IJ SOAJ injection Inject 0.3 mLs (0.3 mg total) into the muscle once. 1 Device 2  . fluconazole (DIFLUCAN) 150 MG tablet Take 1 tablet (150 mg total) by mouth daily. Take 2 tabs on day 1 15 tablet 0  . FLUoxetine (PROZAC) 20 MG tablet Take 2 tablets (40 mg total) by mouth daily. 60 tablet 5  . fluticasone (FLONASE) 50 MCG/ACT nasal spray Place 2 sprays into both nostrils daily. 16 g 12  . glimepiride (AMARYL) 4 MG tablet Take 2 tablets (8 mg total) by mouth 2 (two) times daily. 360 tablet 0  . hydrochlorothiazide (HYDRODIURIL) 25 MG  tablet TAKE ONE TABLET BY MOUTH ONCE DAILY 90 tablet 0  . lisinopril (PRINIVIL,ZESTRIL) 10 MG tablet Take 1 tablet (10 mg total) by mouth daily. 90 tablet 3  . loratadine (CLARITIN) 10 MG tablet Take 1 tablet (10 mg total) by mouth daily. 90 tablet 3  . medroxyPROGESTERone (PROVERA) 10 MG tablet Take one trablet daily for 10 days of the month if no spontaneous menses every 30 days IF negative home UPT 30 tablet 4  .  metFORMIN (GLUCOPHAGE) 500 MG tablet Take 2 tabs by mouth (1000 mg ) 2 times daily. 120 tablet 11  . methocarbamol (ROBAXIN) 750 MG tablet Take 1 tablet (750 mg total) by mouth every 6 (six) hours as needed for muscle spasms. 60 tablet 0  . metoprolol succinate (TOPROL-XL) 25 MG 24 hr tablet Take 1 tablet (25 mg total) by mouth daily. 90 tablet 3  . nystatin (MYCOSTATIN/NYSTOP) 100000 UNIT/GM POWD Apply 500,000 g topically 2 (two) times daily. 180 g 2  . nystatin cream (MYCOSTATIN) Apply 1 application topically 2 (two) times daily. 30 g 0  . nystatin-triamcinolone ointment (MYCOLOG) Apply 1 application topically 2 (two) times daily. 60 g 1  . ONGLYZA 5 MG TABS tablet TAKE ONE TABLET BY MOUTH ONCE DAILY 30 tablet 0  . valACYclovir (VALTREX) 500 MG tablet Take 1 tablet (500 mg total) by mouth 2 (two) times daily. 6 tablet 2  . levothyroxine (SYNTHROID, LEVOTHROID) 50 MCG tablet Take 1 tablet (50 mcg total) by mouth daily. 90 tablet 3   No current facility-administered medications for this visit.    Social History   Social History  . Marital status: Married    Spouse name: N/A  . Number of children: 2  . Years of education: N/A   Occupational History  . Not on file.   Social History Main Topics  . Smoking status: Never Smoker  . Smokeless tobacco: Never Used  . Alcohol use 0.0 oz/week     Comment: socially  . Drug use: No  . Sexual activity: Yes    Partners: Male    Birth control/ protection: None   Other Topics Concern  . Not on file   Social History Narrative   Marital status: married x 1 year; from [place].  Moved to [place] since 2000.       Children:  2 children (22, 22); one grandchildren (5)      Lives:  With husband      Employment:  [employer] full time;       Tobacco: none      Alcohol: social      Drugs: none      Exercise: none   No reported caffeine use    Family History  Problem Relation Age of Onset  . Heart disease Mother 34    CAD  . Diabetes Mother   .  Hypertension Mother   . Cancer Father 34    prostate cancer  . Diabetes Father   . Thyroid disease Neg Hx        Objective:    BP 110/60   Pulse 81   Temp 98.4 F (36.9 C) (Oral)   Resp 18   Wt 287 lb 4 oz (130.3 kg)   LMP 04/28/2016   SpO2 97%   BMI 52.54 kg/m  Physical Exam  Constitutional: She is oriented to person, place, and time. She appears well-developed and well-nourished. No distress.  HENT:  Head: Normocephalic and atraumatic.  Eyes: Conjunctivae are normal. Pupils are  equal, round, and reactive to light.  Neck: Normal range of motion. Neck supple.  Cardiovascular: Normal rate, regular rhythm and normal heart sounds.  Exam reveals no gallop and no friction rub.   No murmur heard. Pulmonary/Chest: Effort normal and breath sounds normal. She has no wheezes. She has no rales.  Musculoskeletal:       Lumbar back: Normal. She exhibits normal range of motion, no tenderness, no bony tenderness, no pain and no spasm.  Neurological: She is alert and oriented to person, place, and time.  Skin: She is not diaphoretic.  Psychiatric: She has a normal mood and affect. Her behavior is normal.  Nursing note and vitals reviewed.  Results for orders placed or performed in visit on 05/05/16  CBC with Differential/Platelet  Result Value Ref Range   WBC 9.8 3.8 - 10.8 K/uL   RBC 4.77 3.80 - 5.10 MIL/uL   Hemoglobin 13.7 11.7 - 15.5 g/dL   HCT 41.9 35.0 - 45.0 %   MCV 87.8 80.0 - 100.0 fL   MCH 28.7 27.0 - 33.0 pg   MCHC 32.7 32.0 - 36.0 g/dL   RDW 14.4 11.0 - 15.0 %   Platelets 400 140 - 400 K/uL   MPV 9.2 7.5 - 12.5 fL   Neutro Abs 6,076 1,500 - 7,800 cells/uL   Lymphs Abs 2,548 850 - 3,900 cells/uL   Monocytes Absolute 882 200 - 950 cells/uL   Eosinophils Absolute 294 15 - 500 cells/uL   Basophils Absolute 0 0 - 200 cells/uL   Neutrophils Relative % 62 %   Lymphocytes Relative 26 %   Monocytes Relative 9 %   Eosinophils Relative 3 %   Basophils Relative 0 %   Smear  Review Criteria for review not met   Comprehensive metabolic panel  Result Value Ref Range   Sodium 140 135 - 146 mmol/L   Potassium 3.5 3.5 - 5.3 mmol/L   Chloride 100 98 - 110 mmol/L   CO2 28 20 - 31 mmol/L   Glucose, Bld 143 (H) 65 - 99 mg/dL   BUN 14 7 - 25 mg/dL   Creat 0.93 0.50 - 1.10 mg/dL   Total Bilirubin 0.4 0.2 - 1.2 mg/dL   Alkaline Phosphatase 57 33 - 115 U/L   AST 18 10 - 30 U/L   ALT 22 6 - 29 U/L   Total Protein 6.8 6.1 - 8.1 g/dL   Albumin 3.9 3.6 - 5.1 g/dL   Calcium 8.7 8.6 - 10.2 mg/dL  POCT glycosylated hemoglobin (Hb A1C)  Result Value Ref Range   Hemoglobin A1C 6.8   POCT glucose (manual entry)  Result Value Ref Range   POC Glucose 137 (A) 70 - 99 mg/dl       Assessment & Plan:   1. Uncontrolled type 2 diabetes mellitus without complication, without long-term current use of insulin ([place])   2. Essential hypertension, benign   3. Lumbar strain, subsequent encounter   4. Morbid obesity due to excess calories ([place])    -diabetes under good control; obtain labs; continue current medications. -lumbar strain improving.  Advance activity at work. Can start regular duty on 05/07/16. -to undergo consultation with general surgery bariatric clinic in the upcoming week. -HTN well controlled.   Orders Placed This Encounter  Procedures  . CBC with Differential/Platelet  . Comprehensive metabolic panel  . POCT glycosylated hemoglobin (Hb A1C)  . POCT glucose (manual entry)   No orders of the defined types were placed in this  encounter.   Return in about 3 months (around 08/05/2016) for recheck.    Lincy Belles Elayne Guerin, M.D. [facility] [address] [place], [place]  [address] [phone] phone [phone] fax

## 2016-05-06 LAB — CBC WITH DIFFERENTIAL/PLATELET
BASOS ABS: 0 {cells}/uL (ref 0–200)
BASOS PCT: 0 %
EOS ABS: 294 {cells}/uL (ref 15–500)
EOS PCT: 3 %
HCT: 41.9 % (ref 35.0–45.0)
Hemoglobin: 13.7 g/dL (ref 11.7–15.5)
LYMPHS PCT: 26 %
Lymphs Abs: 2548 cells/uL (ref 850–3900)
MCH: 28.7 pg (ref 27.0–33.0)
MCHC: 32.7 g/dL (ref 32.0–36.0)
MCV: 87.8 fL (ref 80.0–100.0)
MONOS PCT: 9 %
MPV: 9.2 fL (ref 7.5–12.5)
Monocytes Absolute: 882 cells/uL (ref 200–950)
NEUTROS ABS: 6076 {cells}/uL (ref 1500–7800)
Neutrophils Relative %: 62 %
PLATELETS: 400 10*3/uL (ref 140–400)
RBC: 4.77 MIL/uL (ref 3.80–5.10)
RDW: 14.4 % (ref 11.0–15.0)
WBC: 9.8 10*3/uL (ref 3.8–10.8)

## 2016-05-06 LAB — COMPREHENSIVE METABOLIC PANEL
ALT: 22 U/L (ref 6–29)
AST: 18 U/L (ref 10–30)
Albumin: 3.9 g/dL (ref 3.6–5.1)
Alkaline Phosphatase: 57 U/L (ref 33–115)
BUN: 14 mg/dL (ref 7–25)
CHLORIDE: 100 mmol/L (ref 98–110)
CO2: 28 mmol/L (ref 20–31)
CREATININE: 0.93 mg/dL (ref 0.50–1.10)
Calcium: 8.7 mg/dL (ref 8.6–10.2)
GLUCOSE: 143 mg/dL — AB (ref 65–99)
POTASSIUM: 3.5 mmol/L (ref 3.5–5.3)
SODIUM: 140 mmol/L (ref 135–146)
Total Bilirubin: 0.4 mg/dL (ref 0.2–1.2)
Total Protein: 6.8 g/dL (ref 6.1–8.1)

## 2016-05-10 DIAGNOSIS — E119 Type 2 diabetes mellitus without complications: Secondary | ICD-10-CM | POA: Diagnosis not present

## 2016-05-10 DIAGNOSIS — G4733 Obstructive sleep apnea (adult) (pediatric): Secondary | ICD-10-CM | POA: Diagnosis not present

## 2016-05-10 DIAGNOSIS — I10 Essential (primary) hypertension: Secondary | ICD-10-CM | POA: Diagnosis not present

## 2016-05-11 ENCOUNTER — Ambulatory Visit: Payer: BLUE CROSS/BLUE SHIELD | Admitting: Endocrinology

## 2016-05-11 ENCOUNTER — Ambulatory Visit (INDEPENDENT_AMBULATORY_CARE_PROVIDER_SITE_OTHER): Payer: BLUE CROSS/BLUE SHIELD | Admitting: Endocrinology

## 2016-05-11 ENCOUNTER — Encounter: Payer: Self-pay | Admitting: Endocrinology

## 2016-05-11 ENCOUNTER — Telehealth: Payer: Self-pay | Admitting: Endocrinology

## 2016-05-11 VITALS — BP 98/60 | HR 96 | Temp 99.0°F | Wt 283.2 lb

## 2016-05-11 DIAGNOSIS — E0590 Thyrotoxicosis, unspecified without thyrotoxic crisis or storm: Secondary | ICD-10-CM

## 2016-05-11 LAB — TSH: TSH: 7.33 u[IU]/mL — AB (ref 0.35–4.50)

## 2016-05-11 MED ORDER — LEVOTHYROXINE SODIUM 50 MCG PO TABS: 50.0000 ug | ORAL_TABLET | Freq: Every day | ORAL | Status: DC

## 2016-05-11 NOTE — Telephone Encounter (Signed)
please call patient: Your blood pressure was a little low when you were here. Please have rechecked at Dr Thompson Caul office soon

## 2016-05-11 NOTE — Progress Notes (Signed)
Subjective:    Patient ID: [name], female    DOB: Apr 25, 1976, 40 y.o.   MRN: [id number]  HPI Pt returns for f/u of hyperthyroidism (due to [place]; dx'ed in early 2016 (TFT were normal in late 2015); she had RAI in Dec of 2016; she has not required any medication since the RAI). pt states she feels no different, and well in general.  Past Medical History  Diagnosis Date  . H. pylori infection     dx  09-02-2014-- treated w/ antibiotic  . GERD (gastroesophageal reflux disease)   . Type 2 diabetes mellitus ([place])   . Carpal tunnel syndrome, bilateral   . PCOS (polycystic ovarian syndrome)   . Menorrhagia   . Irregular menstrual cycle   . Hypertension   . Depression     Past Surgical History  Procedure Laterality Date  . Cesarean section  1995  . Carpal tunnel release Bilateral 10/07/2014    Procedure: CARPAL TUNNEL RELEASE BILATERAL;  Surgeon: [name], MD;  Location: [facility];  Service: Orthopedics;  Laterality: Bilateral;    Social History   Social History  . Marital Status: Married    Spouse Name: N/A  . Number of Children: 2  . Years of Education: N/A   Occupational History  . Not on file.   Social History Main Topics  . Smoking status: Never Smoker   . Smokeless tobacco: Never Used  . Alcohol Use: 0.0 oz/week    0 Standard drinks or equivalent per week     Comment: socially  . Drug Use: No  . Sexual Activity:    Partners: Male    Birth Control/ Protection: None   Other Topics Concern  . Not on file   Social History Narrative   Marital status: married x 1 year; from [place].  Moved to [place] since 2000.       Children:  2 children (22, 22); one grandchildren (5)      Lives:  With husband      Employment:  [employer] full time;       Tobacco: none      Alcohol: social      Drugs: none      Exercise: none   No reported caffeine use     Current Outpatient Prescriptions on File Prior to Visit  Medication Sig Dispense Refill    . canagliflozin (INVOKANA) 100 MG TABS tablet Take 1 tablet (100 mg total) by mouth daily. 30 tablet 5  . clindamycin (CLINDAGEL) 1 % gel Apply topically 2 (two) times daily. 30 g 0  . clobetasol cream (TEMOVATE) [id number] % APPLY ONE APPLICATION TOPICALLY TWO TIMES DAILY FOR 10 DAYS 60 g 0  . diclofenac (VOLTAREN) 75 MG EC tablet Take 1 tablet (75 mg total) by mouth 2 (two) times daily as needed for mild pain. 60 tablet 0  . EPINEPHrine (EPIPEN 2-PAK) 0.3 mg/0.3 mL IJ SOAJ injection Inject 0.3 mLs (0.3 mg total) into the muscle once. 1 Device 2  . fluconazole (DIFLUCAN) 150 MG tablet Take 1 tablet (150 mg total) by mouth daily. Take 2 tabs on day 1 15 tablet 0  . FLUoxetine (PROZAC) 20 MG tablet Take 2 tablets (40 mg total) by mouth daily. 60 tablet 5  . fluticasone (FLONASE) 50 MCG/ACT nasal spray Place 2 sprays into both nostrils daily. 16 g 12  . glimepiride (AMARYL) 4 MG tablet Take 2 tablets (8 mg total) by mouth 2 (two) times daily. [place]  tablet 0  . hydrochlorothiazide (HYDRODIURIL) 25 MG tablet TAKE ONE TABLET BY MOUTH ONCE DAILY 90 tablet 0  . lisinopril (PRINIVIL,ZESTRIL) 10 MG tablet Take 1 tablet (10 mg total) by mouth daily. 90 tablet 3  . loratadine (CLARITIN) 10 MG tablet Take 1 tablet (10 mg total) by mouth daily. 90 tablet 3  . medroxyPROGESTERone (PROVERA) 10 MG tablet Take one trablet daily for 10 days of the month if no spontaneous menses every 30 days IF negative home UPT 30 tablet 4  . metFORMIN (GLUCOPHAGE) 500 MG tablet Take 2 tabs by mouth (1000 mg ) 2 times daily. 120 tablet 11  . methocarbamol (ROBAXIN) 750 MG tablet Take 1 tablet (750 mg total) by mouth every 6 (six) hours as needed for muscle spasms. 60 tablet 0  . metoprolol succinate (TOPROL-XL) 25 MG 24 hr tablet Take 1 tablet (25 mg total) by mouth daily. 90 tablet 3  . nystatin (MYCOSTATIN/NYSTOP) 100000 UNIT/GM POWD Apply 500,000 g topically 2 (two) times daily. 180 g 2  . nystatin cream (MYCOSTATIN) Apply 1  application topically 2 (two) times daily. 30 g 0  . nystatin-triamcinolone ointment (MYCOLOG) Apply 1 application topically 2 (two) times daily. 60 g 1  . ONGLYZA 5 MG TABS tablet TAKE ONE TABLET BY MOUTH ONCE DAILY 30 tablet 0  . valACYclovir (VALTREX) 500 MG tablet Take 1 tablet (500 mg total) by mouth 2 (two) times daily. 6 tablet 2  . doxycycline (VIBRAMYCIN) 100 MG capsule Take one tablet twice a day starting the day before procedure. (Patient not taking: Reported on 05/11/2016) 6 capsule 0   No current facility-administered medications on file prior to visit.    Allergies  Allergen Reactions  . 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 Shrimp [Shellfish Allergy] Anaphylaxis    Family History  Problem Relation Age of Onset  . Heart disease Mother 24    CAD  . Diabetes Mother   . Hypertension Mother   . Cancer Father 9    prostate cancer  . Diabetes Father   . Thyroid disease Neg Hx     BP 98/60 mmHg  Pulse 96  Temp(Src) 99 F (37.2 C) (Oral)  Wt 283 lb 3.2 oz (128.459 kg)  LMP 04/28/2016   Review of Systems No weight change.     Objective:   Physical Exam VITAL SIGNS:  See vs page GENERAL: no distress NECK: There is no palpable thyroid enlargement.  No thyroid nodule is palpable.  No palpable lymphadenopathy at the anterior neck.  Lab Results  Component Value Date   TSH 7.33* 05/11/2016   T4TOTAL 8.5 01/29/2016      Assessment & Plan:  Post-RAI hypothyroidism, new.  I have sent a prescription to your pharmacy, for synthroid.  Patient is advised the following: Patient Instructions  blood tests are requested for you today.  We'll let you know about the results.   Please come back for a follow-up appointment in 4 months.

## 2016-05-11 NOTE — Patient Instructions (Addendum)
blood tests are requested for you today.  We'll let you know about the results. Please come back for a follow-up appointment in 4 months.     

## 2016-05-14 ENCOUNTER — Other Ambulatory Visit (HOSPITAL_COMMUNITY): Payer: Self-pay | Admitting: General Surgery

## 2016-05-15 ENCOUNTER — Telehealth: Payer: Self-pay

## 2016-05-15 NOTE — Telephone Encounter (Signed)
I contacted the pt and advised of note below. She voiced understanding.  

## 2016-05-15 NOTE — Telephone Encounter (Signed)
Called and spoke to patient about results, and medication that was sent in. Patient stated she would go get medication. No other questions or concerns.

## 2016-05-24 ENCOUNTER — Ambulatory Visit (HOSPITAL_COMMUNITY)
Admission: RE | Admit: 2016-05-24 | Discharge: 2016-05-24 | Disposition: A | Discharge status: Home / Self Care | Payer: BLUE CROSS/BLUE SHIELD | Source: Ambulatory Visit | Attending: General Surgery | Admitting: General Surgery

## 2016-05-24 ENCOUNTER — Other Ambulatory Visit: Payer: Self-pay

## 2016-05-24 DIAGNOSIS — Z01818 Encounter for other preprocedural examination: Secondary | ICD-10-CM | POA: Diagnosis not present

## 2016-05-24 LAB — PREGNANCY, URINE: PREG TEST UR: NEGATIVE

## 2016-05-29 ENCOUNTER — Encounter: Payer: Self-pay | Admitting: Gynecology

## 2016-05-29 ENCOUNTER — Ambulatory Visit (INDEPENDENT_AMBULATORY_CARE_PROVIDER_SITE_OTHER): Payer: BLUE CROSS/BLUE SHIELD | Admitting: Gynecology

## 2016-05-29 VITALS — BP 128/84 | Ht 62.0 in | Wt 284.0 lb

## 2016-05-29 DIAGNOSIS — E282 Polycystic ovarian syndrome: Secondary | ICD-10-CM | POA: Diagnosis not present

## 2016-05-29 DIAGNOSIS — Z01419 Encounter for gynecological examination (general) (routine) without abnormal findings: Secondary | ICD-10-CM | POA: Diagnosis not present

## 2016-05-29 NOTE — Progress Notes (Signed)
[name] 1976-06-11 [id number]   History:    40 y.o.  for annual gyn exam who is a gravida 2 para 2 and is morbidly obese with history of PCO S hypothyroidism and type 2 diabetes. She is asymptomatic today. She has had history in the past of oligomenorrhea and had been prescribed Provera 10 mg to take 10 days of each month if she did not have spontaneous menses every 30 days. She reports normal menstrual cycles and has not had to use the Provera. She had a normal sonohysterogram this year since she wants to get pregnant but now is in the process of having bariatric surgery done this year first. Her husband has never been checked for sperm count. This is patient's third marriage. She had 2 children with her first marriage benign with the second and 58 with this third marriage. Patient with no past history of any abnormal Pap smears.  Past medical history,surgical history, family history and social history were all reviewed and documented in the EPIC chart.  Gynecologic History Patient's last menstrual period was 04/29/2016. Contraception: none Last Pap: 2015. Results were: normal Last mammogram: Not indicated. Results were: Not indicated  Obstetric History OB History  Gravida Para Term Preterm AB Living  2 2 1 1   2   SAB TAB Ectopic Multiple Live Births          2    # Outcome Date GA Lbr Len/2nd Weight Sex Delivery Anes PTL Lv  2 Preterm 12/22/93 [redacted]w[redacted]d  3 lb 9 oz (1.616 kg) M CS-LTranv   LIV  1 Term 12/03/92 [redacted]w[redacted]d  6 lb 6 oz (2.892 kg) F Vag-Spont None  LIV       ROS: A ROS was performed and pertinent positives and negatives are included in the history.  GENERAL: No fevers or chills. HEENT: No change in vision, no earache, sore throat or sinus congestion. NECK: No pain or stiffness. CARDIOVASCULAR: No chest pain or pressure. No palpitations. PULMONARY: No shortness of breath, cough or wheeze. GASTROINTESTINAL: No abdominal pain, nausea, vomiting or diarrhea, melena or bright red  blood per rectum. GENITOURINARY: No urinary frequency, urgency, hesitancy or dysuria. MUSCULOSKELETAL: No joint or muscle pain, no back pain, no recent trauma. DERMATOLOGIC: No rash, no itching, no lesions. ENDOCRINE: No polyuria, polydipsia, no heat or cold intolerance. No recent change in weight. HEMATOLOGICAL: No anemia or easy bruising or bleeding. NEUROLOGIC: No headache, seizures, numbness, tingling or weakness. PSYCHIATRIC: No depression, no loss of interest in normal activity or change in sleep pattern.     Exam: chaperone present  BP 128/84   Ht 5\' 2"  (1.575 m)   Wt 284 lb (128.8 kg)   LMP 04/29/2016   BMI 51.94 kg/m   Body mass index is 51.94 kg/m.  General appearance : Well developed well nourished female. No acute distress HEENT: Eyes: no retinal hemorrhage or exudates,  Neck supple, trachea midline, no carotid bruits, no thyroidmegaly Lungs: Clear to auscultation, no rhonchi or wheezes, or rib retractions  Heart: Regular rate and rhythm, no murmurs or gallops Breast:Examined in sitting and supine position were symmetrical in appearance, no palpable masses or tenderness,  no skin retraction, no nipple inversion, no nipple discharge, no skin discoloration, no axillary or supraclavicular lymphadenopathy Abdomen: no palpable masses or tenderness, no rebound or guarding Extremities: no edema or skin discoloration or tenderness  Pelvic:  [place], Urethra, Skene Glands: Within normal limits  Vagina: No gross lesions or discharge  Cervix: No gross lesions or discharge  Uterus  anteverted, normal size, shape and consistency, non-tender and mobile  Adnexa  Without masses or tenderness  Anus and perineum  normal   Rectovaginal  normal sphincter tone without palpated masses or tenderness             Hemoccult not indicated     Assessment/Plan:  40 y.o. female for annual exam who is morbidly obese is in the process of scheduling for her bariatric surgery later this  year. She will put on hold her pregnancy attempts until 2 years after her surgery as per recommendation by her general surgeon. Her husband will need in the near future semen analysis since he has never had one. Her PCP has been doing her blood work. No Pap smear indicated this year.   Terrance Mass MD, 4:52 PM 05/29/2016

## 2016-05-30 DIAGNOSIS — F509 Eating disorder, unspecified: Secondary | ICD-10-CM | POA: Diagnosis not present

## 2016-05-31 ENCOUNTER — Encounter: Payer: BLUE CROSS/BLUE SHIELD | Attending: General Surgery | Admitting: Skilled Nursing Facility1

## 2016-05-31 ENCOUNTER — Encounter: Payer: Self-pay | Admitting: Skilled Nursing Facility1

## 2016-05-31 DIAGNOSIS — E119 Type 2 diabetes mellitus without complications: Secondary | ICD-10-CM | POA: Diagnosis not present

## 2016-05-31 DIAGNOSIS — Z713 Dietary counseling and surveillance: Secondary | ICD-10-CM | POA: Diagnosis not present

## 2016-05-31 DIAGNOSIS — I10 Essential (primary) hypertension: Secondary | ICD-10-CM | POA: Insufficient documentation

## 2016-05-31 DIAGNOSIS — G4733 Obstructive sleep apnea (adult) (pediatric): Secondary | ICD-10-CM | POA: Diagnosis not present

## 2016-05-31 NOTE — Progress Notes (Signed)
  Pre-Op Assessment Visit:  Pre-Operative Roux-EnY Surgery  Medical Nutrition Therapy:  Appt start time: 3:30  End time:  4:30.  Patient was seen on 05/31/2016 for Pre-Operative Nutrition Assessment. Assessment and letter of approval faxed to [facility] Bariatric Surgery Program coordinator on 05/31/2016.  Pt states her husband cleans her. Pt states her psychologist wanted her to [place] a book-Bariatric Diet for Dummies and also stated he went through the pre-op diet plan. Sample of premeir protein was given: strawberry and cream lot # [id number] 03-Mar-2017 Preferred Learning Style:   No preference indicated   Learning Readiness:   Ready  Handouts given during visit include:  Pre-Op Goals Bariatric Surgery Protein Shakes  During the appointment today the following Pre-Op Goals were reviewed with the patient: Maintain or lose weight as instructed by your surgeon Make healthy food choices Begin to limit portion sizes Limited concentrated sugars and fried foods Keep fat/sugar in the single digits per serving on   food labels Practice CHEWING your food  (aim for 30 chews per bite or until applesauce consistency) Practice not drinking 15 minutes before, during, and 30 minutes after each meal/snack Avoid all carbonated beverages  Avoid/limit caffeinated beverages  Avoid all sugar-sweetened beverages Consume 3 meals per day; eat every 3-5 hours Make a list of non-food related activities Aim for 64-100 ounces of FLUID daily  Aim for at least 60-80 grams of PROTEIN daily Look for a liquid protein source that contain ?15 g protein and ?5 g carbohydrate  (ex: shakes, drinks, shots)  Patient-Centered Goals: 10/10 specific/non-scale and confidence/importance scale 1-10  Demonstrated degree of understanding via:  Teach Back  Teaching Method Utilized: Visual Auditory Hands on  Barriers to learning/adherence to lifestyle change: none identified  Patient to call the  [facility] to enroll in Pre-Op and Post-Op Nutrition Education when surgery date is scheduled.

## 2016-06-19 ENCOUNTER — Ambulatory Visit (INDEPENDENT_AMBULATORY_CARE_PROVIDER_SITE_OTHER): Payer: BLUE CROSS/BLUE SHIELD | Admitting: Family Medicine

## 2016-06-19 ENCOUNTER — Encounter: Payer: Self-pay | Admitting: Family Medicine

## 2016-06-19 VITALS — BP 104/76 | HR 87 | Temp 98.2°F | Resp 18 | Ht 61.75 in | Wt 285.6 lb

## 2016-06-19 DIAGNOSIS — E162 Hypoglycemia, unspecified: Secondary | ICD-10-CM

## 2016-06-19 DIAGNOSIS — M5441 Lumbago with sciatica, right side: Secondary | ICD-10-CM

## 2016-06-19 MED ORDER — SAXAGLIPTIN HCL 5 MG PO TABS: 5.0000 mg | ORAL_TABLET | Freq: Every day | ORAL | 0 refills | Status: DC

## 2016-06-19 MED ORDER — DICLOFENAC SODIUM 75 MG PO TBEC: 75.0000 mg | DELAYED_RELEASE_TABLET | Freq: Two times a day (BID) | ORAL | 0 refills | Status: DC | PRN

## 2016-06-19 MED ORDER — METHOCARBAMOL 750 MG PO TABS: 750.0000 mg | ORAL_TABLET | Freq: Four times a day (QID) | ORAL | 0 refills | Status: DC | PRN

## 2016-06-19 NOTE — Patient Instructions (Addendum)
Okay to restart the diclofenac twice per day. Robaxin up to every 6 hours as needed for muscle spasm, but this medicine can cause sedation, so start with it at night or when you're not operating machinery or driving.  See information below on sciatica and exercises you can do at home. If you are not improving within the next 1 week, return for recheck as you may need to be seen by orthopedics or physical therapy. Return sooner if worse.   For diabetes, you are taking too much medicine as your blood sugars are dropping too low.  Decrease your glimepiride to 1 pill in the morning, 1 at night with meals. I refilled your Onglyza.  If you have any more low blood sugars - return here or emergency room if needed.  Follow up with Dr. Tamala Julian in next 1 week to discuss plan for diabetes and back pain.   Hypoglycemia Hypoglycemia occurs when the glucose in your blood is too low. Glucose is a type of sugar that is your body's main energy source. Hormones, such as insulin and glucagon, control the level of glucose in the blood. Insulin lowers blood glucose and glucagon increases blood glucose. Having too much insulin in your blood stream, or not eating enough food containing sugar, can result in hypoglycemia. Hypoglycemia can happen to people with or without diabetes. It can develop quickly and can be a medical emergency.  CAUSES   Missing or delaying meals.  Not eating enough carbohydrates at meals.  Taking too much diabetes medicine.  Not timing your oral diabetes medicine or insulin doses with meals, snacks, and exercise.  Nausea and vomiting.  Certain medicines.  Severe illnesses, such as hepatitis, kidney disorders, and certain eating disorders.  Increased activity or exercise without eating something extra or adjusting medicines.  Drinking too much alcohol.  A nerve disorder that affects body functions like your heart rate, blood pressure, and digestion (autonomic neuropathy).  A condition  where the stomach muscles do not function properly (gastroparesis). Therefore, medicines and food may not absorb properly.  Rarely, a tumor of the pancreas can produce too much insulin. SYMPTOMS   Hunger.  Sweating (diaphoresis).  Change in body temperature.  Shakiness.  Headache.  Anxiety.  Lightheadedness.  Irritability.  Difficulty concentrating.  Dry mouth.  Tingling or numbness in the hands or feet.  Restless sleep or sleep disturbances.  Altered speech and coordination.  Change in mental status.  Seizures or prolonged convulsions.  Combativeness.  Drowsiness (lethargic).  Weakness.  Increased heart rate or palpitations.  Confusion.  Pale, gray skin color.  Blurred or double vision.  Fainting. DIAGNOSIS  A physical exam and medical history will be performed. Your caregiver may make a diagnosis based on your symptoms. Blood tests and other lab tests may be performed to confirm a diagnosis. Once the diagnosis is made, your caregiver will see if your signs and symptoms go away once your blood glucose is raised.  TREATMENT  Usually, you can easily treat your hypoglycemia when you notice symptoms.  Check your blood glucose. If it is less than 70 mg/dl, take one of the following:   3-4 glucose tablets.    cup juice.    cup regular soda.   1 cup skim milk.   -1 tube of glucose gel.   5-6 hard candies.   Avoid high-fat drinks or food that may delay a rise in blood glucose levels.  Do not take more than the recommended amount of sugary foods, drinks, gel,  or tablets. Doing so will cause your blood glucose to go too high.   Wait 10-15 minutes and recheck your blood glucose. If it is still less than 70 mg/dl or below your target range, repeat treatment.   Eat a snack if it is more than 1 hour until your next meal.  There may be a time when your blood glucose may go so low that you are unable to treat yourself at home when you start to  notice symptoms. You may need someone to help you. You may even faint or be unable to swallow. If you cannot treat yourself, someone will need to bring you to the hospital.  [place]  If you have diabetes, follow your diabetes management plan by:  Taking your medicines as directed.  Following your exercise plan.  Following your meal plan. Do not skip meals. Eat on time.  Testing your blood glucose regularly. Check your blood glucose before and after exercise. If you exercise longer or different than usual, be sure to check blood glucose more frequently.  Wearing your medical alert jewelry that says you have diabetes.  Identify the cause of your hypoglycemia. Then, develop ways to prevent the recurrence of hypoglycemia.  Do not take a hot bath or shower right after an insulin shot.  Always carry treatment with you. Glucose tablets are the easiest to carry.  If you are going to drink alcohol, drink it only with meals.  Tell friends or family members ways to keep you safe during a seizure. This may include removing hard or sharp objects from the area or turning you on your side.  Maintain a healthy weight. SEEK MEDICAL CARE IF:   You are having problems keeping your blood glucose in your target range.  You are having frequent episodes of hypoglycemia.  You feel you might be having side effects from your medicines.  You are not sure why your blood glucose is dropping so low.  You notice a change in vision or a new problem with your vision. SEEK IMMEDIATE MEDICAL CARE IF:   Confusion develops.  A change in mental status occurs.  The inability to swallow develops.  Fainting occurs.   This information is not intended to replace advice given to you by your health care provider. Make sure you discuss any questions you have with your health care provider.   Document Released: 10/15/2005 Document Revised: 10/20/2013 Document Reviewed: 06/21/2015 [employer]  Interactive Patient Education 2016 [employer] The sciatic nerve runs from the back down the leg and is responsible for sensation and control of the muscles in the back (posterior) side of the thigh, lower leg, and foot. Sciatica is a condition that is characterized by inflammation of this nerve.  SYMPTOMS   Signs of nerve damage, including numbness and/or weakness along the posterior side of the lower extremity.  Pain in the back of the thigh that may also travel down the leg.  Pain that worsens when sitting for long periods of time.  Occasionally, pain in the back or buttock. CAUSES  Inflammation of the sciatic nerve is the cause of sciatica. The inflammation is due to something irritating the nerve. Common sources of irritation include:  Sitting for long periods of time.  Direct trauma to the nerve.  Arthritis of the spine.  Herniated or ruptured disk.  Slipping of the vertebrae (spondylolisthesis).  Pressure from soft tissues, such as muscles or ligament-like tissue (fascia). RISK INCREASES WITH:  Sports that place pressure or stress on the spine (football or weightlifting).  Poor strength and flexibility.  Failure to warm up properly before activity.  Family history of low back pain or disk disorders.  Previous back injury or surgery.  Poor body mechanics, especially when lifting, or poor posture. PREVENTION   Warm up and stretch properly before activity.  Maintain physical fitness:  Strength, flexibility, and endurance.  Cardiovascular fitness.  Learn and use proper technique, especially with posture and lifting. When possible, have coach correct improper technique.  Avoid activities that place stress on the spine. PROGNOSIS If treated properly, then sciatica usually resolves within 6 weeks. However, occasionally surgery is necessary.  RELATED COMPLICATIONS   Permanent nerve damage, including pain, numbness, tingle, or  weakness.  Chronic back pain.  Risks of surgery: infection, bleeding, nerve damage, or damage to surrounding tissues. TREATMENT Treatment initially involves resting from any activities that aggravate your symptoms. The use of ice and medication may help reduce pain and inflammation. The use of strengthening and stretching exercises may help reduce pain with activity. These exercises may be performed at home or with referral to a therapist. A therapist may recommend further treatments, such as transcutaneous electronic nerve stimulation (TENS) or ultrasound. Your caregiver may recommend corticosteroid injections to help reduce inflammation of the sciatic nerve. If symptoms persist despite non-surgical (conservative) treatment, then surgery may be recommended. MEDICATION  If pain medication is necessary, then nonsteroidal anti-inflammatory medications, such as aspirin and ibuprofen, or other minor pain relievers, such as acetaminophen, are often recommended.  Do not take pain medication for 7 days before surgery.  Prescription pain relievers may be given if deemed necessary by your caregiver. Use only as directed and only as much as you need.  Ointments applied to the skin may be helpful.  Corticosteroid injections may be given by your caregiver. These injections should be reserved for the most serious cases, because they may only be given a certain number of times. HEAT AND COLD  Cold treatment (icing) relieves pain and reduces inflammation. Cold treatment should be applied for 10 to 15 minutes every 2 to 3 hours for inflammation and pain and immediately after any activity that aggravates your symptoms. Use ice packs or massage the area with a piece of ice (ice massage).  Heat treatment may be used prior to performing the stretching and strengthening activities prescribed by your caregiver, physical therapist, or athletic trainer. Use a heat pack or soak the injury in warm water. SEEK MEDICAL  CARE IF:  Treatment seems to offer no benefit, or the condition worsens.  Any medications produce adverse side effects. EXERCISES  RANGE OF MOTION (ROM) AND STRETCHING EXERCISES - Sciatica Most people with sciatic will find that their symptoms worsen with either excessive bending forward (flexion) or arching at the low back (extension). The exercises which will help resolve your symptoms will focus on the opposite motion. Your physician, physical therapist or athletic trainer will help you determine which exercises will be most helpful to resolve your low back pain. Do not complete any exercises without first consulting with your clinician. Discontinue any exercises which worsen your symptoms until you speak to your clinician. If you have pain, numbness or tingling which travels down into your buttocks, leg or foot, the goal of the therapy is for these symptoms to move closer to your back and eventually resolve. Occasionally, these leg symptoms will get better, but your low back pain may worsen; this is typically an indication of  progress in your rehabilitation. Be certain to be very alert to any changes in your symptoms and the activities in which you participated in the 24 hours prior to the change. Sharing this information with your clinician will allow him/her to most efficiently treat your condition. These exercises may help you when beginning to rehabilitate your injury. Your symptoms may resolve with or without further involvement from your physician, physical therapist or athletic trainer. While completing these exercises, remember:   Restoring tissue flexibility helps normal motion to return to the joints. This allows healthier, less painful movement and activity.  An effective stretch should be held for at least 30 seconds.  A stretch should never be painful. You should only feel a gentle lengthening or release in the stretched tissue. FLEXION RANGE OF MOTION AND STRETCHING  EXERCISES: STRETCH - Flexion, Single Knee to Chest   Lie on a firm bed or floor with both legs extended in front of you.  Keeping one leg in contact with the floor, bring your opposite knee to your chest. Hold your leg in place by either grabbing behind your thigh or at your knee.  Pull until you feel a gentle stretch in your low back. Hold __________ seconds.  Slowly release your grasp and repeat the exercise with the opposite side. Repeat __________ times. Complete this exercise __________ times per day.  STRETCH - Flexion, Double Knee to Chest  Lie on a firm bed or floor with both legs extended in front of you.  Keeping one leg in contact with the floor, bring your opposite knee to your chest.  Tense your stomach muscles to support your back and then lift your other knee to your chest. Hold your legs in place by either grabbing behind your thighs or at your knees.  Pull both knees toward your chest until you feel a gentle stretch in your low back. Hold __________ seconds.  Tense your stomach muscles and slowly return one leg at a time to the floor. Repeat __________ times. Complete this exercise __________ times per day.  STRETCH - Low Trunk Rotation   Lie on a firm bed or floor. Keeping your legs in front of you, bend your knees so they are both pointed toward the ceiling and your feet are flat on the floor.  Extend your arms out to the side. This will stabilize your upper body by keeping your shoulders in contact with the floor.  Gently and slowly drop both knees together to one side until you feel a gentle stretch in your low back. Hold for __________ seconds.  Tense your stomach muscles to support your low back as you bring your knees back to the starting position. Repeat the exercise to the other side. Repeat __________ times. Complete this exercise __________ times per day  EXTENSION RANGE OF MOTION AND FLEXIBILITY EXERCISES: STRETCH - Extension, Prone on Elbows  Lie on  your stomach on the floor, a bed will be too soft. Place your palms about shoulder width apart and at the height of your head.  Place your elbows under your shoulders. If this is too painful, stack pillows under your chest.  Allow your body to relax so that your hips drop lower and make contact more completely with the floor.  Hold this position for __________ seconds.  Slowly return to lying flat on the floor. Repeat __________ times. Complete this exercise __________ times per day.  RANGE OF MOTION - Extension, Prone Press Ups  Lie on your stomach on the  floor, a bed will be too soft. Place your palms about shoulder width apart and at the height of your head.  Keeping your back as relaxed as possible, slowly straighten your elbows while keeping your hips on the floor. You may adjust the placement of your hands to maximize your comfort. As you gain motion, your hands will come more underneath your shoulders.  Hold this position __________ seconds.  Slowly return to lying flat on the floor. Repeat __________ times. Complete this exercise __________ times per day.  STRENGTHENING EXERCISES - Sciatica  These exercises may help you when beginning to rehabilitate your injury. These exercises should be done near your "sweet spot." This is the neutral, low-back arch, somewhere between fully rounded and fully arched, that is your least painful position. When performed in this safe range of motion, these exercises can be used for people who have either a flexion or extension based injury. These exercises may resolve your symptoms with or without further involvement from your physician, physical therapist or athletic trainer. While completing these exercises, remember:   Muscles can gain both the endurance and the strength needed for everyday activities through controlled exercises.  Complete these exercises as instructed by your physician, physical therapist or athletic trainer. Progress with the  resistance and repetition exercises only as your caregiver advises.  You may experience muscle soreness or fatigue, but the pain or discomfort you are trying to eliminate should never worsen during these exercises. If this pain does worsen, stop and make certain you are following the directions exactly. If the pain is still present after adjustments, discontinue the exercise until you can discuss the trouble with your clinician. STRENGTHENING - Deep Abdominals, Pelvic Tilt   Lie on a firm bed or floor. Keeping your legs in front of you, bend your knees so they are both pointed toward the ceiling and your feet are flat on the floor.  Tense your lower abdominal muscles to press your low back into the floor. This motion will rotate your pelvis so that your tail bone is scooping upwards rather than pointing at your feet or into the floor.  With a gentle tension and even breathing, hold this position for __________ seconds. Repeat __________ times. Complete this exercise __________ times per day.  STRENGTHENING - Abdominals, Crunches   Lie on a firm bed or floor. Keeping your legs in front of you, bend your knees so they are both pointed toward the ceiling and your feet are flat on the floor. Cross your arms over your chest.  Slightly tip your chin down without bending your neck.  Tense your abdominals and slowly lift your trunk high enough to just clear your shoulder blades. Lifting higher can put excessive stress on the low back and does not further strengthen your abdominal muscles.  Control your return to the starting position. Repeat __________ times. Complete this exercise __________ times per day.  STRENGTHENING - Quadruped, Opposite UE/LE Lift  Assume a hands and knees position on a firm surface. Keep your hands under your shoulders and your knees under your hips. You may place padding under your knees for comfort.  Find your neutral spine and gently tense your abdominal muscles so that  you can maintain this position. Your shoulders and hips should form a rectangle that is parallel with the floor and is not twisted.  Keeping your trunk steady, lift your right hand no higher than your shoulder and then your left leg no higher than your hip. Make sure  you are not holding your breath. Hold this position __________ seconds.  Continuing to keep your abdominal muscles tense and your back steady, slowly return to your starting position. Repeat with the opposite arm and leg. Repeat __________ times. Complete this exercise __________ times per day.  STRENGTHENING - Abdominals and Quadriceps, Straight Leg Raise   Lie on a firm bed or floor with both legs extended in front of you.  Keeping one leg in contact with the floor, bend the other knee so that your foot can rest flat on the floor.  Find your neutral spine, and tense your abdominal muscles to maintain your spinal position throughout the exercise.  Slowly lift your straight leg off the floor about 6 inches for a count of 15, making sure to not hold your breath.  Still keeping your neutral spine, slowly lower your leg all the way to the floor. Repeat this exercise with each leg __________ times. Complete this exercise __________ times per day. POSTURE AND BODY MECHANICS CONSIDERATIONS - Sciatica Keeping correct posture when sitting, standing or completing your activities will reduce the stress put on different body tissues, allowing injured tissues a chance to heal and limiting painful experiences. The following are general guidelines for improved posture. Your physician or physical therapist will provide you with any instructions specific to your needs. While reading these guidelines, remember:  The exercises prescribed by your provider will help you have the flexibility and strength to maintain correct postures.  The correct posture provides the optimal environment for your joints to work. All of your joints have less wear and  tear when properly supported by a spine with good posture. This means you will experience a healthier, less painful body.  Correct posture must be practiced with all of your activities, especially prolonged sitting and standing. Correct posture is as important when doing repetitive low-stress activities (typing) as it is when doing a single heavy-load activity (lifting). RESTING POSITIONS Consider which positions are most painful for you when choosing a resting position. If you have pain with flexion-based activities (sitting, bending, stooping, squatting), choose a position that allows you to rest in a less flexed posture. You would want to avoid curling into a fetal position on your side. If your pain worsens with extension-based activities (prolonged standing, working overhead), avoid resting in an extended position such as sleeping on your stomach. Most people will find more comfort when they rest with their spine in a more neutral position, neither too rounded nor too arched. Lying on a non-sagging bed on your side with a pillow between your knees, or on your back with a pillow under your knees will often provide some relief. Keep in mind, being in any one position for a prolonged period of time, no matter how correct your posture, can still lead to stiffness. PROPER SITTING POSTURE In order to minimize stress and discomfort on your spine, you must sit with correct posture Sitting with good posture should be effortless for a healthy body. Returning to good posture is a gradual process. Many people can work toward this most comfortably by using various supports until they have the flexibility and strength to maintain this posture on their own. When sitting with proper posture, your ears will fall over your shoulders and your shoulders will fall over your hips. You should use the back of the chair to support your upper back. Your low back will be in a neutral position, just slightly arched. You may place a  small pillow or  folded towel at the base of your low back for support.  When working at a desk, create an environment that supports good, upright posture. Without extra support, muscles fatigue and lead to excessive strain on joints and other tissues. Keep these recommendations in mind: CHAIR:   A chair should be able to slide under your desk when your back makes contact with the back of the chair. This allows you to work closely.  The chair's height should allow your eyes to be level with the upper part of your monitor and your hands to be slightly lower than your elbows. BODY POSITION  Your feet should make contact with the floor. If this is not possible, use a foot rest.  Keep your ears over your shoulders. This will reduce stress on your neck and low back. INCORRECT SITTING POSTURES   If you are feeling tired and unable to assume a healthy sitting posture, do not slouch or slump. This puts excessive strain on your back tissues, causing more damage and pain. Healthier options include:  Using more support, like a lumbar pillow.  Switching tasks to something that requires you to be upright or walking.  Talking a brief walk.  Lying down to rest in a neutral-spine position. PROLONGED STANDING WHILE SLIGHTLY LEANING FORWARD  When completing a task that requires you to lean forward while standing in one place for a long time, place either foot up on a stationary 2-4 inch high object to help maintain the best posture. When both feet are on the ground, the low back tends to lose its slight inward curve. If this curve flattens (or becomes too large), then the back and your other joints will experience too much stress, fatigue more quickly and can cause pain.  CORRECT STANDING POSTURES Proper standing posture should be assumed with all daily activities, even if they only take a few moments, like when brushing your teeth. As in sitting, your ears should fall over your shoulders and your shoulders  should fall over your hips. You should keep a slight tension in your abdominal muscles to brace your spine. Your tailbone should point down to the ground, not behind your body, resulting in an over-extended swayback posture.  INCORRECT STANDING POSTURES  Common incorrect standing postures include a forward head, locked knees and/or an excessive swayback. WALKING Walk with an upright posture. Your ears, shoulders and hips should all line-up. PROLONGED ACTIVITY IN A FLEXED POSITION When completing a task that requires you to bend forward at your waist or lean over a low surface, try to find a way to stabilize 3 of 4 of your limbs. You can place a hand or elbow on your thigh or rest a knee on the surface you are reaching across. This will provide you more stability so that your muscles do not fatigue as quickly. By keeping your knees relaxed, or slightly bent, you will also reduce stress across your low back. CORRECT LIFTING TECHNIQUES DO :   Assume a wide stance. This will provide you more stability and the opportunity to get as close as possible to the object which you are lifting.  Tense your abdominals to brace your spine; then bend at the knees and hips. Keeping your back locked in a neutral-spine position, lift using your leg muscles. Lift with your legs, keeping your back straight.  Test the weight of unknown objects before attempting to lift them.  Try to keep your elbows locked down at your sides in order get the best  strength from your shoulders when carrying an object.  Always ask for help when lifting heavy or awkward objects. INCORRECT LIFTING TECHNIQUES DO NOT:   Lock your knees when lifting, even if it is a small object.  Bend and twist. Pivot at your feet or move your feet when needing to change directions.  Assume that you cannot safely pick up a paperclip without proper posture.   This information is not intended to replace advice given to you by your health care provider.  Make sure you discuss any questions you have with your health care provider.   Document Released: 10/15/2005 Document Revised: 03/01/2015 Document Reviewed: 01/27/2009 Else[employer] Interactive Patient Education 2016 [employer].   IF you received an x-ray today, you will receive an invoice from [facility]. Please contact [facility] at [phone] with questions or concerns regarding your invoice.   IF you received labwork today, you will receive an invoice from [employer]. Please contact [facility] at [phone] with questions or concerns regarding your invoice.   Our billing staff will not be able to assist you with questions regarding bills from these companies.  You will be contacted with the lab results as soon as they are available. The fastest way to get your results is to activate your My Chart account. Instructions are located on the last page of this paperwork. If you have not heard from [place] regarding the results in 2 weeks, please contact this office.

## 2016-06-19 NOTE — Progress Notes (Signed)
By signing my name below I, Tereasa Coop, attest that this documentation has been prepared under the direction and in the presence of Wendie Agreste, MD. Electonically Signed. Tereasa Coop, Scribe 06/19/2016 at 5:43 PM  Subjective:    Patient ID: [name], female    DOB: 1975/11/08, 40 y.o.   MRN: [id number]  Chief Complaint  Patient presents with  . Back Pain    x this morning    HPI [name] is a 40 y.o. female who presents to the [facility] c/o rt sided low back pain that started today and radiates down her rt leg. Pt denies any fall, trauma, injury, or strain. Pt denies any urinary or bowel incontinence, weakness, or saddle anesthesia. Pt tried her prescribed diclofenac once from last low back pain episode with mild relief. Pt reports that for the past 4 days she has increased her work hours to 10 hr shifts.  Pt was seen for lumbar strain on June 13th, 2017 with a 3 day history of low back pain. At that time pt endorsed hx of fall 2 weeks prior with no initial pain. Pt had an xray of lumbar spine that showed minimal mid to lower lumbar spine DDD, no fracture. Pt was treated for sciatica with mobic and flexeril. Pt seen 6 days later with no improvement with mobic and could not tolerate flexeril. Started diclofenac and robaxin. Pt was referred to physical therapy, but did not end up going to PT.  Seen 2 days later by her PCP Dr. Tamala Julian. Lumbar strain was improving at that time. Seen again June 24th, 2017. Planned on return to regular duty after 10 additional days of light duty. Next office visit July 8th, 2017. Lumbar strain continued to improve and recommended started regular duty July 10th. Pt states she has been doing regular duty at work with no difficultly since July 10th, 2017.     Pt also reports having pruritis on her back at night.   DM At the end of pt's visit, when giving her her after visit summery, pt noted that she has been out of her  onglyza for the past 4 days. Pt was last evaluated for DM on July 8th, 2017. Based on chart review - it appears that she had 30 tablets provided by Dr Tamala Julian in July, but on Rx it was recommended to pt have her endocrinologist, Dr Loanne Drilling manage that medication in the future. However on her endocrinology visit on July 14th, pt was evaluated for hyperthyroidism, and it does not appear diabetes was discussed at that [place].   Additionally pt also reports that she was taking 2 of the 4mg  glimepiride in the morning only until 2 weeks ago when pt noticed the prescription instructions from June Rx provided by Dr. Brigitte Pulse read to take 2 pills BID, so pt started taking 2 glimepiride in the morning and 2 more at night 2 weeks ago. Pt reports she filled the prescription (04/16/16). Pt noticed that she started having sugars in the 50s and 60s most days since she started having 2 glimepiride BID. Pt reports that she has been feeling more fatigued in the past 2 weeks than usual, and sleeping more. Has not had syncopal events or need for ER/EMS treatment. Verifying the timing of the glimepiride changes was initially difficult, but on further discussion, clarified that she has been taking 2 pills twice per day of glimepiride past 4 weeks, and having hypoglycemic symptoms. A1C on July 8th  was 6.8 (pt taking 2 glimepiride in the morning only)    Patient Active Problem List   Diagnosis Date Noted  . Hyperthyroidism 07/28/2015  . Essential hypertension, benign 06/29/2015  . Allergic rhinitis due to pollen 06/29/2015  . Anxiety and depression 06/29/2015  . Gastroesophageal reflux disease without esophagitis 06/29/2015  . OSA on CPAP 06/29/2015  . History of syphilis 05/23/2015  . Amenorrhea 05/23/2015  . Severe obesity (BMI >= 40) ([place]) 09/14/2014  . Irregular menstrual cycle 11/12/2013  . Type II diabetes mellitus, uncontrolled ([place]) 06/20/2009  . POLYCYSTIC OVARIAN DISEASE 06/20/2009  . CARPAL TUNNEL SYNDROME 06/20/2009    Past Medical History:  Diagnosis Date  . Carpal tunnel syndrome, bilateral   . Depression   . GERD (gastroesophageal reflux disease)   . H. pylori infection    dx  09-02-2014-- treated w/ antibiotic  . Hypertension   . Irregular menstrual cycle   . Menorrhagia   . PCOS (polycystic ovarian syndrome)   . Type 2 diabetes mellitus ([place])    Past Surgical History:  Procedure Laterality Date  . CARPAL TUNNEL RELEASE Bilateral 10/07/2014   Procedure: CARPAL TUNNEL RELEASE BILATERAL;  Surgeon: [name], MD;  [facility];  Service: Orthopedics;  Laterality: Bilateral;  . CESAREAN SECTION  1995   Allergies  Allergen Reactions  . 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 Shrimp [Shellfish Allergy] Anaphylaxis    Pt states she no longer has a reaction to shrimp   Prior to Admission medications   Medication Sig Start Date End Date Taking? Authorizing Provider  canagliflozin (INVOKANA) 100 MG TABS tablet Take 1 tablet (100 mg total) by mouth daily. 11/01/15  Yes Ezekiel Slocumb, PA-C  clobetasol cream (TEMOVATE) [id number] % APPLY ONE APPLICATION TOPICALLY TWO TIMES DAILY FOR 10 DAYS 03/24/16  Yes Wardell Honour, MD  EPINEPHrine (EPIPEN 2-PAK) 0.3 mg/0.3 mL IJ SOAJ injection Inject 0.3 mLs (0.3 mg total) into the muscle once. 05/09/15  Yes Orpah Greek, MD  FLUoxetine (PROZAC) 20 MG tablet Take 2 tablets (40 mg total) by mouth daily. 11/01/15  Yes Ezekiel Slocumb, PA-C  glimepiride (AMARYL) 4 MG tablet Take 2 tablets (8 mg total) by mouth 2 (two) times daily. 04/16/16  Yes Shawnee Knapp, MD  hydrochlorothiazide (HYDRODIURIL) 25 MG tablet TAKE ONE TABLET BY MOUTH ONCE DAILY 05/02/16  Yes Wardell Honour, MD  levothyroxine (SYNTHROID, LEVOTHROID) 50 MCG tablet Take 1 tablet (50 mcg total) by mouth daily. 05/11/16  Yes Renato Shin, MD  lisinopril (PRINIVIL,ZESTRIL) 10 MG tablet Take 1 tablet (10 mg total) by mouth daily. 11/01/15  Yes Ezekiel Slocumb, PA-C  loratadine (CLARITIN) 10 MG  tablet Take 1 tablet (10 mg total) by mouth daily. 02/28/16  Yes Wardell Honour, MD  medroxyPROGESTERone (PROVERA) 10 MG tablet Take one trablet daily for 10 days of the month if no spontaneous menses every 30 days IF negative home UPT 05/23/15  Yes Terrance Mass, MD  metFORMIN (GLUCOPHAGE) 500 MG tablet Take 2 tabs by mouth (1000 mg ) 2 times daily. 06/29/15  Yes Wardell Honour, MD  nystatin (MYCOSTATIN/NYSTOP) 100000 UNIT/GM POWD Apply 500,000 g topically 2 (two) times daily. 04/16/16  Yes Shawnee Knapp, MD  nystatin cream (MYCOSTATIN) Apply 1 application topically 2 (two) times daily. 12/22/15  Yes Wardell Honour, MD  nystatin-triamcinolone ointment [facility]) Apply 1 application topically 2 (two) times daily. 04/16/16  Yes Shawnee Knapp, MD  ONGLYZA 5 MG TABS  tablet TAKE ONE TABLET BY MOUTH ONCE DAILY 05/02/16  Yes Wardell Honour, MD  clindamycin (CLINDAGEL) 1 % gel Apply topically 2 (two) times daily. Patient not taking: Reported on 06/19/2016 09/14/15   Wardell Honour, MD  diclofenac (VOLTAREN) 75 MG EC tablet Take 1 tablet (75 mg total) by mouth 2 (two) times daily as needed for mild pain. Patient not taking: Reported on 06/19/2016 04/16/16   Shawnee Knapp, MD  fluconazole (DIFLUCAN) 150 MG tablet Take 1 tablet (150 mg total) by mouth daily. Take 2 tabs on day 1 Patient not taking: Reported on 06/19/2016 04/16/16   Shawnee Knapp, MD  methocarbamol (ROBAXIN) 750 MG tablet Take 1 tablet (750 mg total) by mouth every 6 (six) hours as needed for muscle spasms. Patient not taking: Reported on 06/19/2016 04/16/16   Shawnee Knapp, MD  metoprolol succinate (TOPROL-XL) 25 MG 24 hr tablet Take 1 tablet (25 mg total) by mouth daily. Patient not taking: Reported on 06/19/2016 08/12/15   Wardell Honour, MD  valACYclovir (VALTREX) 500 MG tablet Take 1 tablet (500 mg total) by mouth 2 (two) times daily. Patient not taking: Reported on 06/19/2016 04/10/16   Wendie Agreste, MD   Social History   Social History  . Marital status:  Married    Spouse name: N/A  . Number of children: 2  . Years of education: N/A   Occupational History  . Not on file.   Social History Main Topics  . Smoking status: Never Smoker  . Smokeless tobacco: Never Used  . Alcohol use 0.0 oz/week     Comment: socially  . Drug use: No  . Sexual activity: Yes    Partners: Male    Birth control/ protection: None   Other Topics Concern  . Not on file   Social History Narrative   Marital status: married x 1 year; from [place].  Moved to [place] since 2000.       Children:  2 children (22, 22); one grandchildren (5)      Lives:  With husband      Employment:  [employer] full time;       Tobacco: none      Alcohol: social      Drugs: none      Exercise: none   No reported caffeine use       Review of Systems  Constitutional: Positive for fatigue. Negative for fever.  Musculoskeletal: Positive for back pain (low rt sided, radiating into rt leg).  Skin:       Positive for pruritis on back  Neurological: Negative for weakness and numbness.       Objective:   Physical Exam  Constitutional: She is oriented to person, place, and time. She appears well-developed and well-nourished. No distress.  HENT:  Head: Normocephalic and atraumatic.  Eyes: Conjunctivae are normal. Pupils are equal, round, and reactive to light.  Neck: Neck supple.  Cardiovascular: Normal rate.   Pulmonary/Chest: Effort normal.  Musculoskeletal: Normal range of motion.  Pt has tenderness on rt paralumbar muscles down into her rt sciatic notch. Pt has full flexion at 90 degrees, minimal discomformt rt lateral flexion, otherwise full ROM of back.  Neurological: She is alert and oriented to person, place, and time. She displays no Babinski's sign on the right side. She displays no Babinski's sign on the left side.  Reflex Scores:      Patellar reflexes are 2+ on the right side and 2+ on the  left side.      Achilles reflexes are 2+ on the right side and 2+ on the left  side. Pt has negative seated straight leg raise.  Able to heel and toe walk without difficultly.   Skin: Skin is warm and dry. No rash noted.  Pt has no rash or excoriation on her back.   Psychiatric: She has a normal mood and affect. Her behavior is normal.  Nursing note and vitals reviewed.     Vitals:   06/19/16 1620  BP: 104/76  Pulse: 87  Resp: 18  Temp: 98.2 F (36.8 C)  TempSrc: Oral  SpO2: 96%  Weight: 285 lb 9.6 oz (129.5 kg)  Height: 5' 1.75" (1.568 m)         Assessment & Plan:    [name] is a 40 y.o. female Right-sided low back pain with right-sided sciatica - Plan: methocarbamol (ROBAXIN) 750 MG tablet, diclofenac (VOLTAREN) 75 MG EC tablet  -Recurrent low back pain, that had been improved, now slight return of similar pain, possibly due to increased work hours.  - She states the employer will be working back towards 8 hour shifts as opposed to 10 hour shifts. However I did provide a note to that effect.   -Restart Robaxin and Voltaren as prescribed previously as she seemed to improve most with those medications.  - Follow-up with her primary care provider in one week, and if not improving at that time, consider physical therapy or orthopedic evaluation based on recurrence of symptoms. RTC precautions if worsening sooner.  Hypoglycemia  -History of diabetes, overall well controlled based on last A1c, and appears she was only taking glimepiride 2 pills in the morning at that time. Recent hypoglycemia based on increased dose of glimepiride. Advised her to cut back to 2 pills of glimepiride, but would recommend splitting these to take 1 in the morning and 1 in the evening with meals to lessen chance of hypoglycemia and for more consistent control. I also refilled her Onglyza, but asked her to follow-up with her primary care provider in one week to discuss plan for diabetes (whether it will be followed here or with endocrinology).  RTC/ER precautions if further  hypoglycemic events.  Meds ordered this encounter  Medications  . methocarbamol (ROBAXIN) 750 MG tablet    Sig: Take 1 tablet (750 mg total) by mouth every 6 (six) hours as needed for muscle spasms.    Dispense:  30 tablet    Refill:  0  . diclofenac (VOLTAREN) 75 MG EC tablet    Sig: Take 1 tablet (75 mg total) by mouth 2 (two) times daily as needed for mild pain.    Dispense:  30 tablet    Refill:  0  . saxagliptin HCl (ONGLYZA) 5 MG TABS tablet    Sig: Take 1 tablet (5 mg total) by mouth daily.    Dispense:  30 tablet    Refill:  0    Dr Renato Shin, endocrinologist should manage this after appt 05/11/16.   Patient Instructions   Okay to restart the diclofenac twice per day. Robaxin up to every 6 hours as needed for muscle spasm, but this medicine can cause sedation, so start with it at night or when you're not operating machinery or driving.  See information below on sciatica and exercises you can do at home. If you are not improving within the next 1 week, return for recheck as you may need to be seen by orthopedics or  physical therapy. Return sooner if worse.   For diabetes, you are taking too much medicine as your blood sugars are dropping too low.  Decrease your glimepiride to 1 pill in the morning, 1 at night with meals. I refilled your Onglyza.  If you have any more low blood sugars - return here or emergency room if needed.  Follow up with Dr. Tamala Julian in next 1 week to discuss plan for diabetes and back pain.   Hypoglycemia Hypoglycemia occurs when the glucose in your blood is too low. Glucose is a type of sugar that is your body's main energy source. Hormones, such as insulin and glucagon, control the level of glucose in the blood. Insulin lowers blood glucose and glucagon increases blood glucose. Having too much insulin in your blood stream, or not eating enough food containing sugar, can result in hypoglycemia. Hypoglycemia can happen to people with or without diabetes. It can  develop quickly and can be a medical emergency.  CAUSES   Missing or delaying meals.  Not eating enough carbohydrates at meals.  Taking too much diabetes medicine.  Not timing your oral diabetes medicine or insulin doses with meals, snacks, and exercise.  Nausea and vomiting.  Certain medicines.  Severe illnesses, such as hepatitis, kidney disorders, and certain eating disorders.  Increased activity or exercise without eating something extra or adjusting medicines.  Drinking too much alcohol.  A nerve disorder that affects body functions like your heart rate, blood pressure, and digestion (autonomic neuropathy).  A condition where the stomach muscles do not function properly (gastroparesis). Therefore, medicines and food may not absorb properly.  Rarely, a tumor of the pancreas can produce too much insulin. SYMPTOMS   Hunger.  Sweating (diaphoresis).  Change in body temperature.  Shakiness.  Headache.  Anxiety.  Lightheadedness.  Irritability.  Difficulty concentrating.  Dry mouth.  Tingling or numbness in the hands or feet.  Restless sleep or sleep disturbances.  Altered speech and coordination.  Change in mental status.  Seizures or prolonged convulsions.  Combativeness.  Drowsiness (lethargic).  Weakness.  Increased heart rate or palpitations.  Confusion.  Pale, gray skin color.  Blurred or double vision.  Fainting. DIAGNOSIS  A physical exam and medical history will be performed. Your caregiver may make a diagnosis based on your symptoms. Blood tests and other lab tests may be performed to confirm a diagnosis. Once the diagnosis is made, your caregiver will see if your signs and symptoms go away once your blood glucose is raised.  TREATMENT  Usually, you can easily treat your hypoglycemia when you notice symptoms.  Check your blood glucose. If it is less than 70 mg/dl, take one of the following:   3-4 glucose tablets.    cup  juice.    cup regular soda.   1 cup skim milk.   -1 tube of glucose gel.   5-6 hard candies.   Avoid high-fat drinks or food that may delay a rise in blood glucose levels.  Do not take more than the recommended amount of sugary foods, drinks, gel, or tablets. Doing so will cause your blood glucose to go too high.   Wait 10-15 minutes and recheck your blood glucose. If it is still less than 70 mg/dl or below your target range, repeat treatment.   Eat a snack if it is more than 1 hour until your next meal.  There may be a time when your blood glucose may go so low that you are unable to treat  yourself at home when you start to notice symptoms. You may need someone to help you. You may even faint or be unable to swallow. If you cannot treat yourself, someone will need to bring you to the hospital.  [place]  If you have diabetes, follow your diabetes management plan by:  Taking your medicines as directed.  Following your exercise plan.  Following your meal plan. Do not skip meals. Eat on time.  Testing your blood glucose regularly. Check your blood glucose before and after exercise. If you exercise longer or different than usual, be sure to check blood glucose more frequently.  Wearing your medical alert jewelry that says you have diabetes.  Identify the cause of your hypoglycemia. Then, develop ways to prevent the recurrence of hypoglycemia.  Do not take a hot bath or shower right after an insulin shot.  Always carry treatment with you. Glucose tablets are the easiest to carry.  If you are going to drink alcohol, drink it only with meals.  Tell friends or family members ways to keep you safe during a seizure. This may include removing hard or sharp objects from the area or turning you on your side.  Maintain a healthy weight. SEEK MEDICAL CARE IF:   You are having problems keeping your blood glucose in your target range.  You are having frequent  episodes of hypoglycemia.  You feel you might be having side effects from your medicines.  You are not sure why your blood glucose is dropping so low.  You notice a change in vision or a new problem with your vision. SEEK IMMEDIATE MEDICAL CARE IF:   Confusion develops.  A change in mental status occurs.  The inability to swallow develops.  Fainting occurs.   This information is not intended to replace advice given to you by your health care provider. Make sure you discuss any questions you have with your health care provider.   Document Released: 10/15/2005 Document Revised: 10/20/2013 Document Reviewed: 06/21/2015 Elsevier Interactive Patient Education 2016 [place] The sciatic nerve runs from the back down the leg and is responsible for sensation and control of the muscles in the back (posterior) side of the thigh, lower leg, and foot. Sciatica is a condition that is characterized by inflammation of this nerve.  SYMPTOMS   Signs of nerve damage, including numbness and/or weakness along the posterior side of the lower extremity.  Pain in the back of the thigh that may also travel down the leg.  Pain that worsens when sitting for long periods of time.  Occasionally, pain in the back or buttock. CAUSES  Inflammation of the sciatic nerve is the cause of sciatica. The inflammation is due to something irritating the nerve. Common sources of irritation include:  Sitting for long periods of time.  Direct trauma to the nerve.  Arthritis of the spine.  Herniated or ruptured disk.  Slipping of the vertebrae (spondylolisthesis).  Pressure from soft tissues, such as muscles or ligament-like tissue (fascia). RISK INCREASES WITH:  Sports that place pressure or stress on the spine (football or weightlifting).  Poor strength and flexibility.  Failure to warm up properly before activity.  Family history of low back pain or disk  disorders.  Previous back injury or surgery.  Poor body mechanics, especially when lifting, or poor posture. PREVENTION   Warm up and stretch properly before activity.  Maintain physical fitness:  Strength, flexibility, and endurance.  Cardiovascular fitness.  Learn and use proper technique, especially with posture and lifting. When possible, have coach correct improper technique.  Avoid activities that place stress on the spine. PROGNOSIS If treated properly, then sciatica usually resolves within 6 weeks. However, occasionally surgery is necessary.  RELATED COMPLICATIONS   Permanent nerve damage, including pain, numbness, tingle, or weakness.  Chronic back pain.  Risks of surgery: infection, bleeding, nerve damage, or damage to surrounding tissues. TREATMENT Treatment initially involves resting from any activities that aggravate your symptoms. The use of ice and medication may help reduce pain and inflammation. The use of strengthening and stretching exercises may help reduce pain with activity. These exercises may be performed at home or with referral to a therapist. A therapist may recommend further treatments, such as transcutaneous electronic nerve stimulation (TENS) or ultrasound. Your caregiver may recommend corticosteroid injections to help reduce inflammation of the sciatic nerve. If symptoms persist despite non-surgical (conservative) treatment, then surgery may be recommended. MEDICATION  If pain medication is necessary, then nonsteroidal anti-inflammatory medications, such as aspirin and ibuprofen, or other minor pain relievers, such as acetaminophen, are often recommended.  Do not take pain medication for 7 days before surgery.  Prescription pain relievers may be given if deemed necessary by your caregiver. Use only as directed and only as much as you need.  Ointments applied to the skin may be helpful.  Corticosteroid injections may be given by your caregiver.  These injections should be reserved for the most serious cases, because they may only be given a certain number of times. HEAT AND COLD  Cold treatment (icing) relieves pain and reduces inflammation. Cold treatment should be applied for 10 to 15 minutes every 2 to 3 hours for inflammation and pain and immediately after any activity that aggravates your symptoms. Use ice packs or massage the area with a piece of ice (ice massage).  Heat treatment may be used prior to performing the stretching and strengthening activities prescribed by your caregiver, physical therapist, or athletic trainer. Use a heat pack or soak the injury in warm water. SEEK MEDICAL CARE IF:  Treatment seems to offer no benefit, or the condition worsens.  Any medications produce adverse side effects. EXERCISES  RANGE OF MOTION (ROM) AND STRETCHING EXERCISES - Sciatica Most people with sciatic will find that their symptoms worsen with either excessive bending forward (flexion) or arching at the low back (extension). The exercises which will help resolve your symptoms will focus on the opposite motion. Your physician, physical therapist or athletic trainer will help you determine which exercises will be most helpful to resolve your low back pain. Do not complete any exercises without first consulting with your clinician. Discontinue any exercises which worsen your symptoms until you speak to your clinician. If you have pain, numbness or tingling which travels down into your buttocks, leg or foot, the goal of the therapy is for these symptoms to move closer to your back and eventually resolve. Occasionally, these leg symptoms will get better, but your low back pain may worsen; this is typically an indication of progress in your rehabilitation. Be certain to be very alert to any changes in your symptoms and the activities in which you participated in the 24 hours prior to the change. Sharing this information with your clinician will allow  him/her to most efficiently treat your condition. These exercises may help you when beginning to rehabilitate your injury. Your symptoms may resolve with or without further involvement from your physician, physical therapist or athletic trainer.  While completing these exercises, remember:   Restoring tissue flexibility helps normal motion to return to the joints. This allows healthier, less painful movement and activity.  An effective stretch should be held for at least 30 seconds.  A stretch should never be painful. You should only feel a gentle lengthening or release in the stretched tissue. FLEXION RANGE OF MOTION AND STRETCHING EXERCISES: STRETCH - Flexion, Single Knee to Chest   Lie on a firm bed or floor with both legs extended in front of you.  Keeping one leg in contact with the floor, bring your opposite knee to your chest. Hold your leg in place by either grabbing behind your thigh or at your knee.  Pull until you feel a gentle stretch in your low back. Hold __________ seconds.  Slowly release your grasp and repeat the exercise with the opposite side. Repeat __________ times. Complete this exercise __________ times per day.  STRETCH - Flexion, Double Knee to Chest  Lie on a firm bed or floor with both legs extended in front of you.  Keeping one leg in contact with the floor, bring your opposite knee to your chest.  Tense your stomach muscles to support your back and then lift your other knee to your chest. Hold your legs in place by either grabbing behind your thighs or at your knees.  Pull both knees toward your chest until you feel a gentle stretch in your low back. Hold __________ seconds.  Tense your stomach muscles and slowly return one leg at a time to the floor. Repeat __________ times. Complete this exercise __________ times per day.  STRETCH - Low Trunk Rotation   Lie on a firm bed or floor. Keeping your legs in front of you, bend your knees so they are both  pointed toward the ceiling and your feet are flat on the floor.  Extend your arms out to the side. This will stabilize your upper body by keeping your shoulders in contact with the floor.  Gently and slowly drop both knees together to one side until you feel a gentle stretch in your low back. Hold for __________ seconds.  Tense your stomach muscles to support your low back as you bring your knees back to the starting position. Repeat the exercise to the other side. Repeat __________ times. Complete this exercise __________ times per day  EXTENSION RANGE OF MOTION AND FLEXIBILITY EXERCISES: STRETCH - Extension, Prone on Elbows  Lie on your stomach on the floor, a bed will be too soft. Place your palms about shoulder width apart and at the height of your head.  Place your elbows under your shoulders. If this is too painful, stack pillows under your chest.  Allow your body to relax so that your hips drop lower and make contact more completely with the floor.  Hold this position for __________ seconds.  Slowly return to lying flat on the floor. Repeat __________ times. Complete this exercise __________ times per day.  RANGE OF MOTION - Extension, Prone Press Ups  Lie on your stomach on the floor, a bed will be too soft. Place your palms about shoulder width apart and at the height of your head.  Keeping your back as relaxed as possible, slowly straighten your elbows while keeping your hips on the floor. You may adjust the placement of your hands to maximize your comfort. As you gain motion, your hands will come more underneath your shoulders.  Hold this position __________ seconds.  Slowly return to lying  flat on the floor. Repeat __________ times. Complete this exercise __________ times per day.  STRENGTHENING EXERCISES - Sciatica  These exercises may help you when beginning to rehabilitate your injury. These exercises should be done near your "sweet spot." This is the neutral, low-back  arch, somewhere between fully rounded and fully arched, that is your least painful position. When performed in this safe range of motion, these exercises can be used for people who have either a flexion or extension based injury. These exercises may resolve your symptoms with or without further involvement from your physician, physical therapist or athletic trainer. While completing these exercises, remember:   Muscles can gain both the endurance and the strength needed for everyday activities through controlled exercises.  Complete these exercises as instructed by your physician, physical therapist or athletic trainer. Progress with the resistance and repetition exercises only as your caregiver advises.  You may experience muscle soreness or fatigue, but the pain or discomfort you are trying to eliminate should never worsen during these exercises. If this pain does worsen, stop and make certain you are following the directions exactly. If the pain is still present after adjustments, discontinue the exercise until you can discuss the trouble with your clinician. STRENGTHENING - Deep Abdominals, Pelvic Tilt   Lie on a firm bed or floor. Keeping your legs in front of you, bend your knees so they are both pointed toward the ceiling and your feet are flat on the floor.  Tense your lower abdominal muscles to press your low back into the floor. This motion will rotate your pelvis so that your tail bone is scooping upwards rather than pointing at your feet or into the floor.  With a gentle tension and even breathing, hold this position for __________ seconds. Repeat __________ times. Complete this exercise __________ times per day.  STRENGTHENING - Abdominals, Crunches   Lie on a firm bed or floor. Keeping your legs in front of you, bend your knees so they are both pointed toward the ceiling and your feet are flat on the floor. Cross your arms over your chest.  Slightly tip your chin down without bending  your neck.  Tense your abdominals and slowly lift your trunk high enough to just clear your shoulder blades. Lifting higher can put excessive stress on the low back and does not further strengthen your abdominal muscles.  Control your return to the starting position. Repeat __________ times. Complete this exercise __________ times per day.  STRENGTHENING - Quadruped, Opposite UE/LE Lift  Assume a hands and knees position on a firm surface. Keep your hands under your shoulders and your knees under your hips. You may place padding under your knees for comfort.  Find your neutral spine and gently tense your abdominal muscles so that you can maintain this position. Your shoulders and hips should form a rectangle that is parallel with the floor and is not twisted.  Keeping your trunk steady, lift your right hand no higher than your shoulder and then your left leg no higher than your hip. Make sure you are not holding your breath. Hold this position __________ seconds.  Continuing to keep your abdominal muscles tense and your back steady, slowly return to your starting position. Repeat with the opposite arm and leg. Repeat __________ times. Complete this exercise __________ times per day.  STRENGTHENING - Abdominals and Quadriceps, Straight Leg Raise   Lie on a firm bed or floor with both legs extended in front of you.  Keeping one  leg in contact with the floor, bend the other knee so that your foot can rest flat on the floor.  Find your neutral spine, and tense your abdominal muscles to maintain your spinal position throughout the exercise.  Slowly lift your straight leg off the floor about 6 inches for a count of 15, making sure to not hold your breath.  Still keeping your neutral spine, slowly lower your leg all the way to the floor. Repeat this exercise with each leg __________ times. Complete this exercise __________ times per day. POSTURE AND BODY MECHANICS CONSIDERATIONS -  Sciatica Keeping correct posture when sitting, standing or completing your activities will reduce the stress put on different body tissues, allowing injured tissues a chance to heal and limiting painful experiences. The following are general guidelines for improved posture. Your physician or physical therapist will provide you with any instructions specific to your needs. While reading these guidelines, remember:  The exercises prescribed by your provider will help you have the flexibility and strength to maintain correct postures.  The correct posture provides the optimal environment for your joints to work. All of your joints have less wear and tear when properly supported by a spine with good posture. This means you will experience a healthier, less painful body.  Correct posture must be practiced with all of your activities, especially prolonged sitting and standing. Correct posture is as important when doing repetitive low-stress activities (typing) as it is when doing a single heavy-load activity (lifting). RESTING POSITIONS Consider which positions are most painful for you when choosing a resting position. If you have pain with flexion-based activities (sitting, bending, stooping, squatting), choose a position that allows you to rest in a less flexed posture. You would want to avoid curling into a fetal position on your side. If your pain worsens with extension-based activities (prolonged standing, working overhead), avoid resting in an extended position such as sleeping on your stomach. Most people will find more comfort when they rest with their spine in a more neutral position, neither too rounded nor too arched. Lying on a non-sagging bed on your side with a pillow between your knees, or on your back with a pillow under your knees will often provide some relief. Keep in mind, being in any one position for a prolonged period of time, no matter how correct your posture, can still lead to  stiffness. PROPER SITTING POSTURE In order to minimize stress and discomfort on your spine, you must sit with correct posture Sitting with good posture should be effortless for a healthy body. Returning to good posture is a gradual process. Many people can work toward this most comfortably by using various supports until they have the flexibility and strength to maintain this posture on their own. When sitting with proper posture, your ears will fall over your shoulders and your shoulders will fall over your hips. You should use the back of the chair to support your upper back. Your low back will be in a neutral position, just slightly arched. You may place a small pillow or folded towel at the base of your low back for support.  When working at a desk, create an environment that supports good, upright posture. Without extra support, muscles fatigue and lead to excessive strain on joints and other tissues. Keep these recommendations in mind: CHAIR:   A chair should be able to slide under your desk when your back makes contact with the back of the chair. This allows you to work closely.  The chair's height should allow your eyes to be level with the upper part of your monitor and your hands to be slightly lower than your elbows. BODY POSITION  Your feet should make contact with the floor. If this is not possible, use a foot rest.  Keep your ears over your shoulders. This will reduce stress on your neck and low back. INCORRECT SITTING POSTURES   If you are feeling tired and unable to assume a healthy sitting posture, do not slouch or slump. This puts excessive strain on your back tissues, causing more damage and pain. Healthier options include:  Using more support, like a lumbar pillow.  Switching tasks to something that requires you to be upright or walking.  Talking a brief walk.  Lying down to rest in a neutral-spine position. PROLONGED STANDING WHILE SLIGHTLY LEANING FORWARD  When  completing a task that requires you to lean forward while standing in one place for a long time, place either foot up on a stationary 2-4 inch high object to help maintain the best posture. When both feet are on the ground, the low back tends to lose its slight inward curve. If this curve flattens (or becomes too large), then the back and your other joints will experience too much stress, fatigue more quickly and can cause pain.  CORRECT STANDING POSTURES Proper standing posture should be assumed with all daily activities, even if they only take a few moments, like when brushing your teeth. As in sitting, your ears should fall over your shoulders and your shoulders should fall over your hips. You should keep a slight tension in your abdominal muscles to brace your spine. Your tailbone should point down to the ground, not behind your body, resulting in an over-extended swayback posture.  INCORRECT STANDING POSTURES  Common incorrect standing postures include a forward head, locked knees and/or an excessive swayback. WALKING Walk with an upright posture. Your ears, shoulders and hips should all line-up. PROLONGED ACTIVITY IN A FLEXED POSITION When completing a task that requires you to bend forward at your waist or lean over a low surface, try to find a way to stabilize 3 of 4 of your limbs. You can place a hand or elbow on your thigh or rest a knee on the surface you are reaching across. This will provide you more stability so that your muscles do not fatigue as quickly. By keeping your knees relaxed, or slightly bent, you will also reduce stress across your low back. CORRECT LIFTING TECHNIQUES DO :   Assume a wide stance. This will provide you more stability and the opportunity to get as close as possible to the object which you are lifting.  Tense your abdominals to brace your spine; then bend at the knees and hips. Keeping your back locked in a neutral-spine position, lift using your leg muscles.  Lift with your legs, keeping your back straight.  Test the weight of unknown objects before attempting to lift them.  Try to keep your elbows locked down at your sides in order get the best strength from your shoulders when carrying an object.  Always ask for help when lifting heavy or awkward objects. INCORRECT LIFTING TECHNIQUES DO NOT:   Lock your knees when lifting, even if it is a small object.  Bend and twist. Pivot at your feet or move your feet when needing to change directions.  Assume that you cannot safely pick up a paperclip without proper posture.   This information is not intended  to replace advice given to you by your health care provider. Make sure you discuss any questions you have with your health care provider.   Document Released: 10/15/2005 Document Revised: 03/01/2015 Document Reviewed: 01/27/2009 Elsevier Interactive Patient Education 2016 [employer].   IF you received an x-ray today, you will receive an invoice from [facility]. Please contact [facility] at [phone] with questions or concerns regarding your invoice.   IF you received labwork today, you will receive an invoice from [employer]. Please contact [facility] at [phone] with questions or concerns regarding your invoice.   Our billing staff will not be able to assist you with questions regarding bills from these companies.  You will be contacted with the lab results as soon as they are available. The fastest way to get your results is to activate your My Chart account. Instructions are located on the last page of this paperwork. If you have not heard from [place] regarding the results in 2 weeks, please contact this office.        I personally performed the services described in this documentation, which was scribed in my presence. The recorded information has been reviewed and considered, and addended by me as needed.   Signed,   Merri Ray,  MD [facility].  06/20/16 4:36 PM

## 2016-06-20 ENCOUNTER — Encounter: Payer: Self-pay | Admitting: Family Medicine

## 2016-06-20 NOTE — Progress Notes (Signed)
By signing my name below I, Tereasa Coop, attest that this documentation has been prepared under the direction and in the presence of Wendie Agreste, MD. Electonically Signed. Tereasa Coop, Scribe 06/20/2016 at 5:43 PM  Subjective:    Patient ID: [name], female    DOB: [birth year], 40 y.o.   MRN: [id number]  Chief Complaint  Patient presents with  . Back Pain    x this morning    Back Pain  Pertinent negatives include no fever, numbness or weakness.   [name] is a 40 y.o. female who presents to the [facility] c/o rt sided low back pain that started today and radiates down her rt leg. Pt denies any fall, trauma, injury, or strain. Pt denies any urinary or bowel incontinence, weakness, or saddle anesthesia. Pt tried her prescribed diclofenac once from last low back pain episode with mild relief. Pt reports that for the past 4 days she has increased her work hours to 10 hr shifts.  Pt was seen for lumbar strain on June 13th, 2017 with a 3 day history of low back pain. At that time pt endorsed hx of fall 2 weeks prior with no initial pain. Pt had an xray of lumbar spine that showed minimal mid to lower lumbar spine DDD, no fracture. Pt was treated for sciatica with mobic and flexeril. Pt seen 6 days later with no improvement with mobic and could not tolerate flexeril. Started diclofenac and robaxin. Pt was referred to physical therapy, but did not end up going to PT.  Seen 2 days later by her PCP Dr. Tamala Julian. Lumbar strain was improving at that time. Seen again June 24th, 2017. Planned on return to regular duty after 10 additional days of light duty. Next office visit July 8th, 2017. Lumbar strain continued to improve and recommended started regular duty July 10th. Pt states she has been doing regular duty at work with no difficultly since July 10th, 2017.     Pt also reports having pruritis on her back at night.   DM At the end of pt's visit, when giving  her her after visit summery, pt noted that she has been out of her onglyza for the past 4 days. Pt was last evaluated for DM on July 8th, 2017. Based on chart review - it appears that she had 30 tablets provided by Dr Tamala Julian in July, but on Rx it was recommended to pt have her endocrinologist, Dr Loanne Drilling manage that medication in the future. However on her endocrinology visit on July 14th, pt was evaluated for hyperthyroidism, and it does not appear diabetes was discussed at that [place].   Additionally pt also reports that she was taking 2 of the 4mg  glimepiride in the morning only until 2 weeks ago when pt noticed the prescription instructions from June Rx provided by Dr. Brigitte Pulse read to take 2 pills BID, so pt started taking 2 glimepiride in the morning and 2 more at night 2 weeks ago. Pt reports she filled the prescription (04/16/16). Pt noticed that she started having sugars in the 50s and 60s most days since she started having 2 glimepiride BID. Pt reports that she has been feeling more fatigued in the past 2 weeks than usual, and sleeping more. Has not had syncopal events or need for ER/EMS treatment. Verifying the timing of the glimepiride changes was initially difficult, but on further discussion, clarified that she has been taking 2 pills twice per day of  glimepiride past 4 weeks, and having hypoglycemic symptoms. A1C on July 8th was 6.8 (pt taking 2 glimepiride in the morning only)    Patient Active Problem List   Diagnosis Date Noted  . Hyperthyroidism 07/28/2015  . Essential hypertension, benign 06/29/2015  . Allergic rhinitis due to pollen 06/29/2015  . Anxiety and depression 06/29/2015  . Gastroesophageal reflux disease without esophagitis 06/29/2015  . OSA on CPAP 06/29/2015  . History of syphilis 05/23/2015  . Amenorrhea 05/23/2015  . Severe obesity (BMI >= 40) ([place]) 09/14/2014  . Irregular menstrual cycle 11/12/2013  . Type II diabetes mellitus, uncontrolled ([place]) 06/20/2009  . POLYCYSTIC  OVARIAN DISEASE 06/20/2009  . CARPAL TUNNEL SYNDROME 06/20/2009   Past Medical History:  Diagnosis Date  . Carpal tunnel syndrome, bilateral   . Depression   . GERD (gastroesophageal reflux disease)   . H. pylori infection    dx  09-02-2014-- treated w/ antibiotic  . Hypertension   . Irregular menstrual cycle   . Menorrhagia   . PCOS (polycystic ovarian syndrome)   . Type 2 diabetes mellitus ([place])    Past Surgical History:  Procedure Laterality Date  . CARPAL TUNNEL RELEASE Bilateral 10/07/2014   Procedure: CARPAL TUNNEL RELEASE BILATERAL;  Surgeon: [name], MD;  Location: [facility];  Service: Orthopedics;  Laterality: Bilateral;  . CESAREAN SECTION  1995   Allergies  Allergen Reactions  . 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 Shrimp [Shellfish Allergy] Anaphylaxis    Pt states she no longer has a reaction to shrimp   Prior to Admission medications   Medication Sig Start Date End Date Taking? Authorizing Provider  canagliflozin (INVOKANA) 100 MG TABS tablet Take 1 tablet (100 mg total) by mouth daily. 11/01/15  Yes Ezekiel Slocumb, PA-C  clobetasol cream (TEMOVATE) [id number] % APPLY ONE APPLICATION TOPICALLY TWO TIMES DAILY FOR 10 DAYS 03/24/16  Yes Wardell Honour, MD  EPINEPHrine (EPIPEN 2-PAK) 0.3 mg/0.3 mL IJ SOAJ injection Inject 0.3 mLs (0.3 mg total) into the muscle once. 05/09/15  Yes Orpah Greek, MD  FLUoxetine (PROZAC) 20 MG tablet Take 2 tablets (40 mg total) by mouth daily. 11/01/15  Yes Ezekiel Slocumb, PA-C  glimepiride (AMARYL) 4 MG tablet Take 2 tablets (8 mg total) by mouth 2 (two) times daily. 04/16/16  Yes Shawnee Knapp, MD  hydrochlorothiazide (HYDRODIURIL) 25 MG tablet TAKE ONE TABLET BY MOUTH ONCE DAILY 05/02/16  Yes Wardell Honour, MD  levothyroxine (SYNTHROID, LEVOTHROID) 50 MCG tablet Take 1 tablet (50 mcg total) by mouth daily. 05/11/16  Yes Renato Shin, MD  lisinopril (PRINIVIL,ZESTRIL) 10 MG tablet Take 1 tablet (10 mg total) by mouth  daily. 11/01/15  Yes Ezekiel Slocumb, PA-C  loratadine (CLARITIN) 10 MG tablet Take 1 tablet (10 mg total) by mouth daily. 02/28/16  Yes Wardell Honour, MD  medroxyPROGESTERone (PROVERA) 10 MG tablet Take one trablet daily for 10 days of the month if no spontaneous menses every 30 days IF negative home UPT 05/23/15  Yes Terrance Mass, MD  metFORMIN (GLUCOPHAGE) 500 MG tablet Take 2 tabs by mouth (1000 mg ) 2 times daily. 06/29/15  Yes Wardell Honour, MD  nystatin (MYCOSTATIN/NYSTOP) 100000 UNIT/GM POWD Apply 500,000 g topically 2 (two) times daily. 04/16/16  Yes Shawnee Knapp, MD  nystatin cream (MYCOSTATIN) Apply 1 application topically 2 (two) times daily. 12/22/15  Yes Wardell Honour, MD  nystatin-triamcinolone ointment [facility]) Apply 1 application topically 2 (two) times daily.  04/16/16  Yes Shawnee Knapp, MD  ONGLYZA 5 MG TABS tablet TAKE ONE TABLET BY MOUTH ONCE DAILY 05/02/16  Yes Wardell Honour, MD  clindamycin (CLINDAGEL) 1 % gel Apply topically 2 (two) times daily. Patient not taking: Reported on 06/19/2016 09/14/15   Wardell Honour, MD  diclofenac (VOLTAREN) 75 MG EC tablet Take 1 tablet (75 mg total) by mouth 2 (two) times daily as needed for mild pain. Patient not taking: Reported on 06/19/2016 04/16/16   Shawnee Knapp, MD  fluconazole (DIFLUCAN) 150 MG tablet Take 1 tablet (150 mg total) by mouth daily. Take 2 tabs on day 1 Patient not taking: Reported on 06/19/2016 04/16/16   Shawnee Knapp, MD  methocarbamol (ROBAXIN) 750 MG tablet Take 1 tablet (750 mg total) by mouth every 6 (six) hours as needed for muscle spasms. Patient not taking: Reported on 06/19/2016 04/16/16   Shawnee Knapp, MD  metoprolol succinate (TOPROL-XL) 25 MG 24 hr tablet Take 1 tablet (25 mg total) by mouth daily. Patient not taking: Reported on 06/19/2016 08/12/15   Wardell Honour, MD  valACYclovir (VALTREX) 500 MG tablet Take 1 tablet (500 mg total) by mouth 2 (two) times daily. Patient not taking: Reported on 06/19/2016 04/10/16   Wendie Agreste, MD   Social History   Social History  . Marital status: Married    Spouse name: N/A  . Number of children: 2  . Years of education: N/A   Occupational History  . Not on file.   Social History Main Topics  . Smoking status: Never Smoker  . Smokeless tobacco: Never Used  . Alcohol use 0.0 oz/week     Comment: socially  . Drug use: No  . Sexual activity: Yes    Partners: Male    Birth control/ protection: None   Other Topics Concern  . Not on file   Social History Narrative   Marital status: married x 1 year; from [place].  Moved to [place] since 2000.       Children:  2 children (22, 22); one grandchildren (5)      Lives:  With husband      Employment:  [employer] full time;       Tobacco: none      Alcohol: social      Drugs: none      Exercise: none   No reported caffeine use       Review of Systems  Constitutional: Positive for fatigue. Negative for fever.  Musculoskeletal: Positive for back pain.  Skin:       Positive for pruritis on back  Neurological: Negative for weakness and numbness.       Objective:   Physical Exam  Constitutional: She is oriented to person, place, and time. She appears well-developed and well-nourished. No distress.  HENT:  Head: Normocephalic and atraumatic.  Eyes: Conjunctivae are normal. Pupils are equal, round, and reactive to light.  Neck: Neck supple.  Cardiovascular: Normal rate.   Pulmonary/Chest: Effort normal.  Musculoskeletal: Normal range of motion.  Pt has tenderness on rt paralumbar muscles down into her rt sciatic notch. Pt has full flexion at 90 degrees, minimal discomformt rt lateral flexion, otherwise full ROM of back.  Neurological: She is alert and oriented to person, place, and time. She displays no Babinski's sign on the right side. She displays no Babinski's sign on the left side.  Reflex Scores:      Patellar reflexes are 2+ on the right  side and 2+ on the left side.      Achilles reflexes are 2+ on the  right side and 2+ on the left side. Pt has negative seated straight leg raise.  Able to heel and toe walk without difficultly.   Skin: Skin is warm and dry. No rash noted.  Pt has no rash or excoriation on her back.   Psychiatric: She has a normal mood and affect. Her behavior is normal.  Nursing note and vitals reviewed.     Vitals:   06/19/16 1620  BP: 104/76  Pulse: 87  Resp: 18  Temp: 98.2 F (36.8 C)  TempSrc: Oral  SpO2: 96%  Weight: 285 lb 9.6 oz (129.5 kg)  Height: 5' 1.75" (1.568 m)         Assessment & Plan:    [name] is a 40 y.o. female Right-sided low back pain with right-sided sciatica - Plan: methocarbamol (ROBAXIN) 750 MG tablet, diclofenac (VOLTAREN) 75 MG EC tablet  -Recurrent low back pain, that had been improved, now slight return of similar pain, possibly due to increased work hours.  - She states the employer will be working back towards 8 hour shifts as opposed to 10 hour shifts. However I did provide a note to that effect.   -Restart Robaxin and Voltaren as prescribed previously as she seemed to improve most with those medications.  - Follow-up with her primary care provider in one week, and if not improving at that time, consider physical therapy or orthopedic evaluation based on recurrence of symptoms. RTC precautions if worsening sooner.  Hypoglycemia  -History of diabetes, overall well controlled based on last A1c, and appears she was only taking glimepiride 2 pills in the morning at that time. Recent hypoglycemia based on increased dose of glimepiride. Advised her to cut back to 2 pills of glimepiride, but would recommend splitting these to take 1 in the morning and 1 in the evening with meals to lessen chance of hypoglycemia and for more consistent control. I also refilled her Onglyza, but asked her to follow-up with her primary care provider in one week to discuss plan for diabetes (whether it will be followed here or with endocrinology).   RTC/ER precautions if further hypoglycemic events.  Meds ordered this encounter  Medications  . methocarbamol (ROBAXIN) 750 MG tablet    Sig: Take 1 tablet (750 mg total) by mouth every 6 (six) hours as needed for muscle spasms.    Dispense:  30 tablet    Refill:  0  . diclofenac (VOLTAREN) 75 MG EC tablet    Sig: Take 1 tablet (75 mg total) by mouth 2 (two) times daily as needed for mild pain.    Dispense:  30 tablet    Refill:  0  . saxagliptin HCl (ONGLYZA) 5 MG TABS tablet    Sig: Take 1 tablet (5 mg total) by mouth daily.    Dispense:  30 tablet    Refill:  0    Dr Renato Shin, endocrinologist should manage this after appt 05/11/16.   Patient Instructions   Okay to restart the diclofenac twice per day. Robaxin up to every 6 hours as needed for muscle spasm, but this medicine can cause sedation, so start with it at night or when you're not operating machinery or driving.  See information below on sciatica and exercises you can do at home. If you are not improving within the next 1 week, return for recheck as you may need to  be seen by orthopedics or physical therapy. Return sooner if worse.   For diabetes, you are taking too much medicine as your blood sugars are dropping too low.  Decrease your glimepiride to 1 pill in the morning, 1 at night with meals. I refilled your Onglyza.  If you have any more low blood sugars - return here or emergency room if needed.  Follow up with Dr. Tamala Julian in next 1 week to discuss plan for diabetes and back pain.   Hypoglycemia Hypoglycemia occurs when the glucose in your blood is too low. Glucose is a type of sugar that is your body's main energy source. Hormones, such as insulin and glucagon, control the level of glucose in the blood. Insulin lowers blood glucose and glucagon increases blood glucose. Having too much insulin in your blood stream, or not eating enough food containing sugar, can result in hypoglycemia. Hypoglycemia can happen to people  with or without diabetes. It can develop quickly and can be a medical emergency.  CAUSES   Missing or delaying meals.  Not eating enough carbohydrates at meals.  Taking too much diabetes medicine.  Not timing your oral diabetes medicine or insulin doses with meals, snacks, and exercise.  Nausea and vomiting.  Certain medicines.  Severe illnesses, such as hepatitis, kidney disorders, and certain eating disorders.  Increased activity or exercise without eating something extra or adjusting medicines.  Drinking too much alcohol.  A nerve disorder that affects body functions like your heart rate, blood pressure, and digestion (autonomic neuropathy).  A condition where the stomach muscles do not function properly (gastroparesis). Therefore, medicines and food may not absorb properly.  Rarely, a tumor of the pancreas can produce too much insulin. SYMPTOMS   Hunger.  Sweating (diaphoresis).  Change in body temperature.  Shakiness.  Headache.  Anxiety.  Lightheadedness.  Irritability.  Difficulty concentrating.  Dry mouth.  Tingling or numbness in the hands or feet.  Restless sleep or sleep disturbances.  Altered speech and coordination.  Change in mental status.  Seizures or prolonged convulsions.  Combativeness.  Drowsiness (lethargic).  Weakness.  Increased heart rate or palpitations.  Confusion.  Pale, gray skin color.  Blurred or double vision.  Fainting. DIAGNOSIS  A physical exam and medical history will be performed. Your caregiver may make a diagnosis based on your symptoms. Blood tests and other lab tests may be performed to confirm a diagnosis. Once the diagnosis is made, your caregiver will see if your signs and symptoms go away once your blood glucose is raised.  TREATMENT  Usually, you can easily treat your hypoglycemia when you notice symptoms.  Check your blood glucose. If it is less than 70 mg/dl, take one of the following:    3-4 glucose tablets.    cup juice.    cup regular soda.   1 cup skim milk.   -1 tube of glucose gel.   5-6 hard candies.   Avoid high-fat drinks or food that may delay a rise in blood glucose levels.  Do not take more than the recommended amount of sugary foods, drinks, gel, or tablets. Doing so will cause your blood glucose to go too high.   Wait 10-15 minutes and recheck your blood glucose. If it is still less than 70 mg/dl or below your target range, repeat treatment.   Eat a snack if it is more than 1 hour until your next meal.  There may be a time when your blood glucose may go so low that  you are unable to treat yourself at home when you start to notice symptoms. You may need someone to help you. You may even faint or be unable to swallow. If you cannot treat yourself, someone will need to bring you to the hospital.  [place]  If you have diabetes, follow your diabetes management plan by:  Taking your medicines as directed.  Following your exercise plan.  Following your meal plan. Do not skip meals. Eat on time.  Testing your blood glucose regularly. Check your blood glucose before and after exercise. If you exercise longer or different than usual, be sure to check blood glucose more frequently.  Wearing your medical alert jewelry that says you have diabetes.  Identify the cause of your hypoglycemia. Then, develop ways to prevent the recurrence of hypoglycemia.  Do not take a hot bath or shower right after an insulin shot.  Always carry treatment with you. Glucose tablets are the easiest to carry.  If you are going to drink alcohol, drink it only with meals.  Tell friends or family members ways to keep you safe during a seizure. This may include removing hard or sharp objects from the area or turning you on your side.  Maintain a healthy weight. SEEK MEDICAL CARE IF:   You are having problems keeping your blood glucose in your target  range.  You are having frequent episodes of hypoglycemia.  You feel you might be having side effects from your medicines.  You are not sure why your blood glucose is dropping so low.  You notice a change in vision or a new problem with your vision. SEEK IMMEDIATE MEDICAL CARE IF:   Confusion develops.  A change in mental status occurs.  The inability to swallow develops.  Fainting occurs.   This information is not intended to replace advice given to you by your health care provider. Make sure you discuss any questions you have with your health care provider.   Document Released: 10/15/2005 Document Revised: 10/20/2013 Document Reviewed: 06/21/2015 Elsevier Interactive Patient Education 2016 [place] The sciatic nerve runs from the back down the leg and is responsible for sensation and control of the muscles in the back (posterior) side of the thigh, lower leg, and foot. Sciatica is a condition that is characterized by inflammation of this nerve.  SYMPTOMS   Signs of nerve damage, including numbness and/or weakness along the posterior side of the lower extremity.  Pain in the back of the thigh that may also travel down the leg.  Pain that worsens when sitting for long periods of time.  Occasionally, pain in the back or buttock. CAUSES  Inflammation of the sciatic nerve is the cause of sciatica. The inflammation is due to something irritating the nerve. Common sources of irritation include:  Sitting for long periods of time.  Direct trauma to the nerve.  Arthritis of the spine.  Herniated or ruptured disk.  Slipping of the vertebrae (spondylolisthesis).  Pressure from soft tissues, such as muscles or ligament-like tissue (fascia). RISK INCREASES WITH:  Sports that place pressure or stress on the spine (football or weightlifting).  Poor strength and flexibility.  Failure to warm up properly before activity.  Family history of low  back pain or disk disorders.  Previous back injury or surgery.  Poor body mechanics, especially when lifting, or poor posture. PREVENTION   Warm up and stretch properly before activity.  Maintain physical fitness:  Strength, flexibility,  and endurance.  Cardiovascular fitness.  Learn and use proper technique, especially with posture and lifting. When possible, have coach correct improper technique.  Avoid activities that place stress on the spine. PROGNOSIS If treated properly, then sciatica usually resolves within 6 weeks. However, occasionally surgery is necessary.  RELATED COMPLICATIONS   Permanent nerve damage, including pain, numbness, tingle, or weakness.  Chronic back pain.  Risks of surgery: infection, bleeding, nerve damage, or damage to surrounding tissues. TREATMENT Treatment initially involves resting from any activities that aggravate your symptoms. The use of ice and medication may help reduce pain and inflammation. The use of strengthening and stretching exercises may help reduce pain with activity. These exercises may be performed at home or with referral to a therapist. A therapist may recommend further treatments, such as transcutaneous electronic nerve stimulation (TENS) or ultrasound. Your caregiver may recommend corticosteroid injections to help reduce inflammation of the sciatic nerve. If symptoms persist despite non-surgical (conservative) treatment, then surgery may be recommended. MEDICATION  If pain medication is necessary, then nonsteroidal anti-inflammatory medications, such as aspirin and ibuprofen, or other minor pain relievers, such as acetaminophen, are often recommended.  Do not take pain medication for 7 days before surgery.  Prescription pain relievers may be given if deemed necessary by your caregiver. Use only as directed and only as much as you need.  Ointments applied to the skin may be helpful.  Corticosteroid injections may be given by  your caregiver. These injections should be reserved for the most serious cases, because they may only be given a certain number of times. HEAT AND COLD  Cold treatment (icing) relieves pain and reduces inflammation. Cold treatment should be applied for 10 to 15 minutes every 2 to 3 hours for inflammation and pain and immediately after any activity that aggravates your symptoms. Use ice packs or massage the area with a piece of ice (ice massage).  Heat treatment may be used prior to performing the stretching and strengthening activities prescribed by your caregiver, physical therapist, or athletic trainer. Use a heat pack or soak the injury in warm water. SEEK MEDICAL CARE IF:  Treatment seems to offer no benefit, or the condition worsens.  Any medications produce adverse side effects. EXERCISES  RANGE OF MOTION (ROM) AND STRETCHING EXERCISES - Sciatica Most people with sciatic will find that their symptoms worsen with either excessive bending forward (flexion) or arching at the low back (extension). The exercises which will help resolve your symptoms will focus on the opposite motion. Your physician, physical therapist or athletic trainer will help you determine which exercises will be most helpful to resolve your low back pain. Do not complete any exercises without first consulting with your clinician. Discontinue any exercises which worsen your symptoms until you speak to your clinician. If you have pain, numbness or tingling which travels down into your buttocks, leg or foot, the goal of the therapy is for these symptoms to move closer to your back and eventually resolve. Occasionally, these leg symptoms will get better, but your low back pain may worsen; this is typically an indication of progress in your rehabilitation. Be certain to be very alert to any changes in your symptoms and the activities in which you participated in the 24 hours prior to the change. Sharing this information with your  clinician will allow him/her to most efficiently treat your condition. These exercises may help you when beginning to rehabilitate your injury. Your symptoms may resolve with or without further involvement from your  physician, physical therapist or athletic trainer. While completing these exercises, remember:   Restoring tissue flexibility helps normal motion to return to the joints. This allows healthier, less painful movement and activity.  An effective stretch should be held for at least 30 seconds.  A stretch should never be painful. You should only feel a gentle lengthening or release in the stretched tissue. FLEXION RANGE OF MOTION AND STRETCHING EXERCISES: STRETCH - Flexion, Single Knee to Chest   Lie on a firm bed or floor with both legs extended in front of you.  Keeping one leg in contact with the floor, bring your opposite knee to your chest. Hold your leg in place by either grabbing behind your thigh or at your knee.  Pull until you feel a gentle stretch in your low back. Hold __________ seconds.  Slowly release your grasp and repeat the exercise with the opposite side. Repeat __________ times. Complete this exercise __________ times per day.  STRETCH - Flexion, Double Knee to Chest  Lie on a firm bed or floor with both legs extended in front of you.  Keeping one leg in contact with the floor, bring your opposite knee to your chest.  Tense your stomach muscles to support your back and then lift your other knee to your chest. Hold your legs in place by either grabbing behind your thighs or at your knees.  Pull both knees toward your chest until you feel a gentle stretch in your low back. Hold __________ seconds.  Tense your stomach muscles and slowly return one leg at a time to the floor. Repeat __________ times. Complete this exercise __________ times per day.  STRETCH - Low Trunk Rotation   Lie on a firm bed or floor. Keeping your legs in front of you, bend your knees so  they are both pointed toward the ceiling and your feet are flat on the floor.  Extend your arms out to the side. This will stabilize your upper body by keeping your shoulders in contact with the floor.  Gently and slowly drop both knees together to one side until you feel a gentle stretch in your low back. Hold for __________ seconds.  Tense your stomach muscles to support your low back as you bring your knees back to the starting position. Repeat the exercise to the other side. Repeat __________ times. Complete this exercise __________ times per day  EXTENSION RANGE OF MOTION AND FLEXIBILITY EXERCISES: STRETCH - Extension, Prone on Elbows  Lie on your stomach on the floor, a bed will be too soft. Place your palms about shoulder width apart and at the height of your head.  Place your elbows under your shoulders. If this is too painful, stack pillows under your chest.  Allow your body to relax so that your hips drop lower and make contact more completely with the floor.  Hold this position for __________ seconds.  Slowly return to lying flat on the floor. Repeat __________ times. Complete this exercise __________ times per day.  RANGE OF MOTION - Extension, Prone Press Ups  Lie on your stomach on the floor, a bed will be too soft. Place your palms about shoulder width apart and at the height of your head.  Keeping your back as relaxed as possible, slowly straighten your elbows while keeping your hips on the floor. You may adjust the placement of your hands to maximize your comfort. As you gain motion, your hands will come more underneath your shoulders.  Hold this position __________  seconds.  Slowly return to lying flat on the floor. Repeat __________ times. Complete this exercise __________ times per day.  STRENGTHENING EXERCISES - Sciatica  These exercises may help you when beginning to rehabilitate your injury. These exercises should be done near your "sweet spot." This is the  neutral, low-back arch, somewhere between fully rounded and fully arched, that is your least painful position. When performed in this safe range of motion, these exercises can be used for people who have either a flexion or extension based injury. These exercises may resolve your symptoms with or without further involvement from your physician, physical therapist or athletic trainer. While completing these exercises, remember:   Muscles can gain both the endurance and the strength needed for everyday activities through controlled exercises.  Complete these exercises as instructed by your physician, physical therapist or athletic trainer. Progress with the resistance and repetition exercises only as your caregiver advises.  You may experience muscle soreness or fatigue, but the pain or discomfort you are trying to eliminate should never worsen during these exercises. If this pain does worsen, stop and make certain you are following the directions exactly. If the pain is still present after adjustments, discontinue the exercise until you can discuss the trouble with your clinician. STRENGTHENING - Deep Abdominals, Pelvic Tilt   Lie on a firm bed or floor. Keeping your legs in front of you, bend your knees so they are both pointed toward the ceiling and your feet are flat on the floor.  Tense your lower abdominal muscles to press your low back into the floor. This motion will rotate your pelvis so that your tail bone is scooping upwards rather than pointing at your feet or into the floor.  With a gentle tension and even breathing, hold this position for __________ seconds. Repeat __________ times. Complete this exercise __________ times per day.  STRENGTHENING - Abdominals, Crunches   Lie on a firm bed or floor. Keeping your legs in front of you, bend your knees so they are both pointed toward the ceiling and your feet are flat on the floor. Cross your arms over your chest.  Slightly tip your chin  down without bending your neck.  Tense your abdominals and slowly lift your trunk high enough to just clear your shoulder blades. Lifting higher can put excessive stress on the low back and does not further strengthen your abdominal muscles.  Control your return to the starting position. Repeat __________ times. Complete this exercise __________ times per day.  STRENGTHENING - Quadruped, Opposite UE/LE Lift  Assume a hands and knees position on a firm surface. Keep your hands under your shoulders and your knees under your hips. You may place padding under your knees for comfort.  Find your neutral spine and gently tense your abdominal muscles so that you can maintain this position. Your shoulders and hips should form a rectangle that is parallel with the floor and is not twisted.  Keeping your trunk steady, lift your right hand no higher than your shoulder and then your left leg no higher than your hip. Make sure you are not holding your breath. Hold this position __________ seconds.  Continuing to keep your abdominal muscles tense and your back steady, slowly return to your starting position. Repeat with the opposite arm and leg. Repeat __________ times. Complete this exercise __________ times per day.  STRENGTHENING - Abdominals and Quadriceps, Straight Leg Raise   Lie on a firm bed or floor with both legs extended in  front of you.  Keeping one leg in contact with the floor, bend the other knee so that your foot can rest flat on the floor.  Find your neutral spine, and tense your abdominal muscles to maintain your spinal position throughout the exercise.  Slowly lift your straight leg off the floor about 6 inches for a count of 15, making sure to not hold your breath.  Still keeping your neutral spine, slowly lower your leg all the way to the floor. Repeat this exercise with each leg __________ times. Complete this exercise __________ times per day. POSTURE AND BODY MECHANICS  CONSIDERATIONS - Sciatica Keeping correct posture when sitting, standing or completing your activities will reduce the stress put on different body tissues, allowing injured tissues a chance to heal and limiting painful experiences. The following are general guidelines for improved posture. Your physician or physical therapist will provide you with any instructions specific to your needs. While reading these guidelines, remember:  The exercises prescribed by your provider will help you have the flexibility and strength to maintain correct postures.  The correct posture provides the optimal environment for your joints to work. All of your joints have less wear and tear when properly supported by a spine with good posture. This means you will experience a healthier, less painful body.  Correct posture must be practiced with all of your activities, especially prolonged sitting and standing. Correct posture is as important when doing repetitive low-stress activities (typing) as it is when doing a single heavy-load activity (lifting). RESTING POSITIONS Consider which positions are most painful for you when choosing a resting position. If you have pain with flexion-based activities (sitting, bending, stooping, squatting), choose a position that allows you to rest in a less flexed posture. You would want to avoid curling into a fetal position on your side. If your pain worsens with extension-based activities (prolonged standing, working overhead), avoid resting in an extended position such as sleeping on your stomach. Most people will find more comfort when they rest with their spine in a more neutral position, neither too rounded nor too arched. Lying on a non-sagging bed on your side with a pillow between your knees, or on your back with a pillow under your knees will often provide some relief. Keep in mind, being in any one position for a prolonged period of time, no matter how correct your posture, can still  lead to stiffness. PROPER SITTING POSTURE In order to minimize stress and discomfort on your spine, you must sit with correct posture Sitting with good posture should be effortless for a healthy body. Returning to good posture is a gradual process. Many people can work toward this most comfortably by using various supports until they have the flexibility and strength to maintain this posture on their own. When sitting with proper posture, your ears will fall over your shoulders and your shoulders will fall over your hips. You should use the back of the chair to support your upper back. Your low back will be in a neutral position, just slightly arched. You may place a small pillow or folded towel at the base of your low back for support.  When working at a desk, create an environment that supports good, upright posture. Without extra support, muscles fatigue and lead to excessive strain on joints and other tissues. Keep these recommendations in mind: CHAIR:   A chair should be able to slide under your desk when your back makes contact with the back of the chair.  This allows you to work closely.  The chair's height should allow your eyes to be level with the upper part of your monitor and your hands to be slightly lower than your elbows. BODY POSITION  Your feet should make contact with the floor. If this is not possible, use a foot rest.  Keep your ears over your shoulders. This will reduce stress on your neck and low back. INCORRECT SITTING POSTURES   If you are feeling tired and unable to assume a healthy sitting posture, do not slouch or slump. This puts excessive strain on your back tissues, causing more damage and pain. Healthier options include:  Using more support, like a lumbar pillow.  Switching tasks to something that requires you to be upright or walking.  Talking a brief walk.  Lying down to rest in a neutral-spine position. PROLONGED STANDING WHILE SLIGHTLY LEANING FORWARD    When completing a task that requires you to lean forward while standing in one place for a long time, place either foot up on a stationary 2-4 inch high object to help maintain the best posture. When both feet are on the ground, the low back tends to lose its slight inward curve. If this curve flattens (or becomes too large), then the back and your other joints will experience too much stress, fatigue more quickly and can cause pain.  CORRECT STANDING POSTURES Proper standing posture should be assumed with all daily activities, even if they only take a few moments, like when brushing your teeth. As in sitting, your ears should fall over your shoulders and your shoulders should fall over your hips. You should keep a slight tension in your abdominal muscles to brace your spine. Your tailbone should point down to the ground, not behind your body, resulting in an over-extended swayback posture.  INCORRECT STANDING POSTURES  Common incorrect standing postures include a forward head, locked knees and/or an excessive swayback. WALKING Walk with an upright posture. Your ears, shoulders and hips should all line-up. PROLONGED ACTIVITY IN A FLEXED POSITION When completing a task that requires you to bend forward at your waist or lean over a low surface, try to find a way to stabilize 3 of 4 of your limbs. You can place a hand or elbow on your thigh or rest a knee on the surface you are reaching across. This will provide you more stability so that your muscles do not fatigue as quickly. By keeping your knees relaxed, or slightly bent, you will also reduce stress across your low back. CORRECT LIFTING TECHNIQUES DO :   Assume a wide stance. This will provide you more stability and the opportunity to get as close as possible to the object which you are lifting.  Tense your abdominals to brace your spine; then bend at the knees and hips. Keeping your back locked in a neutral-spine position, lift using your leg  muscles. Lift with your legs, keeping your back straight.  Test the weight of unknown objects before attempting to lift them.  Try to keep your elbows locked down at your sides in order get the best strength from your shoulders when carrying an object.  Always ask for help when lifting heavy or awkward objects. INCORRECT LIFTING TECHNIQUES DO NOT:   Lock your knees when lifting, even if it is a small object.  Bend and twist. Pivot at your feet or move your feet when needing to change directions.  Assume that you cannot safely pick up a paperclip without proper  posture.   This information is not intended to replace advice given to you by your health care provider. Make sure you discuss any questions you have with your health care provider.   Document Released: 10/15/2005 Document Revised: 03/01/2015 Document Reviewed: 01/27/2009 Elsevier Interactive Patient Education 2016 [employer].   IF you received an x-ray today, you will receive an invoice from [facility]. Please contact [facility] at [phone] with questions or concerns regarding your invoice.   IF you received labwork today, you will receive an invoice from [employer]. Please contact [facility] at [phone] with questions or concerns regarding your invoice.   Our billing staff will not be able to assist you with questions regarding bills from these companies.  You will be contacted with the lab results as soon as they are available. The fastest way to get your results is to activate your My Chart account. Instructions are located on the last page of this paperwork. If you have not heard from [place] regarding the results in 2 weeks, please contact this office.        I personally performed the services described in this documentation, which was scribed in my presence. The recorded information has been reviewed and considered, and addended by me as needed.   Signed,    Merri Ray, MD [facility].  06/20/16 4:47 PM

## 2016-06-27 ENCOUNTER — Other Ambulatory Visit: Payer: Self-pay | Admitting: Urgent Care

## 2016-06-27 ENCOUNTER — Other Ambulatory Visit: Payer: Self-pay | Admitting: Family Medicine

## 2016-06-27 ENCOUNTER — Ambulatory Visit (INDEPENDENT_AMBULATORY_CARE_PROVIDER_SITE_OTHER): Payer: BLUE CROSS/BLUE SHIELD | Admitting: Urgent Care

## 2016-06-27 VITALS — BP 118/70 | HR 81 | Temp 97.8°F | Resp 18 | Ht 61.75 in | Wt 288.0 lb

## 2016-06-27 DIAGNOSIS — R531 Weakness: Secondary | ICD-10-CM | POA: Diagnosis not present

## 2016-06-27 DIAGNOSIS — R112 Nausea with vomiting, unspecified: Secondary | ICD-10-CM | POA: Diagnosis not present

## 2016-06-27 DIAGNOSIS — R519 Headache, unspecified: Secondary | ICD-10-CM

## 2016-06-27 DIAGNOSIS — R51 Headache: Secondary | ICD-10-CM | POA: Diagnosis not present

## 2016-06-27 LAB — GLUCOSE, POCT (MANUAL RESULT ENTRY): POC Glucose: 126 mg/dL — AB (ref 70–99)

## 2016-06-27 LAB — POCT CBC
Granulocyte percent: 62.9 %G (ref 37–80)
HCT, POC: 41.7 % (ref 37.7–47.9)
HEMOGLOBIN: 14.3 g/dL (ref 12.2–16.2)
Lymph, poc: 2.3 (ref 0.6–3.4)
MCH: 29.6 pg (ref 27–31.2)
MCHC: 34.4 g/dL (ref 31.8–35.4)
MCV: 86 fL (ref 80–97)
MID (cbc): 0.6 (ref 0–0.9)
MPV: 7.4 fL (ref 0–99.8)
POC Granulocyte: 4.9 (ref 2–6.9)
POC LYMPH PERCENT: 30 %L (ref 10–50)
POC MID %: 7.1 % (ref 0–12)
Platelet Count, POC: 311 10*3/uL (ref 142–424)
RBC: 4.85 M/uL (ref 4.04–5.48)
RDW, POC: 13.5 %
WBC: 7.8 10*3/uL (ref 4.6–10.2)

## 2016-06-27 LAB — POCT URINALYSIS DIP (MANUAL ENTRY)
Bilirubin, UA: NEGATIVE
GLUCOSE UA: NEGATIVE
Ketones, POC UA: NEGATIVE
Leukocytes, UA: NEGATIVE
NITRITE UA: NEGATIVE
Protein Ur, POC: NEGATIVE
SPEC GRAV UA: 1.01
UROBILINOGEN UA: 0.2
pH, UA: 5

## 2016-06-27 LAB — POC MICROSCOPIC URINALYSIS (UMFC): Mucus: ABSENT

## 2016-06-27 LAB — TSH: TSH: 7.78 m[IU]/L — AB

## 2016-06-27 MED ORDER — METOPROLOL SUCCINATE ER 25 MG PO TB24: 25.0000 mg | ORAL_TABLET | Freq: Every day | ORAL | 3 refills | Status: DC

## 2016-06-27 NOTE — Patient Instructions (Addendum)
Por favor tome 500mg  de Tylenol cada 6-8 horas.     IF you received an x-ray today, you will receive an invoice from [facility]. Please contact [facility] at [phone] with questions or concerns regarding your invoice.   IF you received labwork today, you will receive an invoice from [employer]. Please contact [facility] at [phone] with questions or concerns regarding your invoice.   Our billing staff will not be able to assist you with questions regarding bills from these companies.  You will be contacted with the lab results as soon as they are available. The fastest way to get your results is to activate your My Chart account. Instructions are located on the last page of this paperwork. If you have not heard from [place] regarding the results in 2 weeks, please contact this office.

## 2016-06-27 NOTE — Progress Notes (Signed)
MRN: [id number] DOB: [birth year]  Subjective:   [name] is a 40 y.o. female presenting for chief complaint of Leg Pain (both legs feel heavy, since 4am) and Headache (noticed this morning around 4am, pressure)  Reports onset of fatigue, lower leg pain and weakness. Patient felt nauseated, vomited once today, had dizziness and felt faint, has pressure type headache over occiput and neck. 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 Has not tried any medications for relief. Denies fever, cough, chest pain, abdominal pain, syncope. She has tried to hydrate this morning. She is currently on her cycle, is regular. Reports compliance with her medical therapy.  Carmilita has a current medication list which includes the following prescription(s): canagliflozin, clobetasol cream, diclofenac, epinephrine, fluoxetine, glimepiride, hydrochlorothiazide, levothyroxine, lisinopril, loratadine, medroxyprogesterone, metformin, methocarbamol, nystatin, nystatin cream, nystatin-triamcinolone ointment, saxagliptin hcl, metoprolol succinate, and valacyclovir. 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  Jonathan  has a past medical history of Carpal tunnel syndrome, bilateral; Depression; GERD (gastroesophageal reflux disease); H. pylori infection; Hypertension; Irregular menstrual cycle; Menorrhagia; PCOS (polycystic ovarian syndrome); and Type 2 diabetes mellitus ([place]). Also  has a past surgical history that includes Cesarean section (1995) and Carpal tunnel release (Bilateral, 10/07/2014).  Objective:   Vitals: BP 118/70   Pulse 81   Temp 97.8 F (36.6 C) (Oral)   Resp 18   Ht 5' 1.75" (1.568 m)   Wt 288 lb (130.6 kg)   LMP 06/22/2016   SpO2 98%   BMI 53.10 kg/m   Physical Exam  Constitutional: She is oriented to person, place, and time. She appears well-developed and well-nourished.  HENT:  Mouth/Throat: Oropharynx is clear and  moist.  Eyes: EOM are normal. Pupils are equal, round, and reactive to light. No scleral icterus.  Neck: Normal range of motion. Neck supple. No thyromegaly present.  Cardiovascular: Normal rate, regular rhythm and intact distal pulses.  Exam reveals no gallop and no friction rub.   No murmur heard. Pulmonary/Chest: No respiratory distress. She has no wheezes. She has no rales.  Abdominal: Soft. Bowel sounds are normal. She exhibits no distension and no mass. There is no tenderness.  Musculoskeletal: She exhibits no edema.  Neurological: She is alert and oriented to person, place, and time.  Skin: Skin is warm and dry.   Results for orders placed or performed in visit on 06/27/16 (from the past 24 hour(s))  POCT CBC     Status: None   Collection Time: 06/27/16  9:31 AM  Result Value Ref Range   WBC 7.8 4.6 - 10.2 K/uL   Lymph, poc 2.3 0.6 - 3.4   POC LYMPH PERCENT 30.0 10 - 50 %L   MID (cbc) 0.6 0 - 0.9   POC MID % 7.1 0 - 12 %M   POC Granulocyte 4.9 2 - 6.9   Granulocyte percent 62.9 37 - 80 %G   RBC 4.85 4.04 - 5.48 M/uL   Hemoglobin 14.3 12.2 - 16.2 g/dL   HCT, POC 41.7 37.7 - 47.9 %   MCV 86.0 80 - 97 fL   MCH, POC 29.6 27 - 31.2 pg   MCHC 34.4 31.8 - 35.4 g/dL   RDW, POC 13.5 %   Platelet Count, POC 311 142 - 424 K/uL   MPV 7.4 0 - 99.8 fL  POCT glucose (manual entry)     Status: Abnormal   Collection Time: 06/27/16  9:32 AM  Result Value Ref Range   POC Glucose 126 (A) 70 - 99 mg/dl  POCT urinalysis dipstick     Status: Abnormal   Collection Time: 06/27/16  9:47 AM  Result Value Ref Range   Color, UA yellow yellow   Clarity, UA cloudy (A) clear   Glucose, UA negative negative   Bilirubin, UA negative negative   Ketones, POC UA negative negative   Spec Grav, UA 1.010    Blood, UA large (A) negative   pH, UA 5.0    Protein Ur, POC negative negative   Urobilinogen, UA 0.2    Nitrite, UA Negative Negative   Leukocytes, UA Negative Negative  POCT Microscopic  Urinalysis ([facility])     Status: Abnormal   Collection Time: 06/27/16  9:58 AM  Result Value Ref Range   WBC,UR,HPF,POC Few (A) None WBC/hpf   RBC,UR,HPF,POC Too numerous to count  (A) None RBC/hpf   Bacteria Many (A) None, Too numerous to count   Mucus Absent Absent   Epithelial Cells, UR Per Microscopy Moderate (A) None, Too numerous to count cells/hpf   Assessment and Plan :   1. Nausea and vomiting, intractability of vomiting not specified, unspecified vomiting type 2. Weakness 3. Headache, unspecified headache type - Labs reassuring, discussed results with patient. TSH is pending. I advised supportive care at home, patient requested today and tomorrow off. I advised she rtc tomorrow afternoon if she continues to feel malaise. She verbalized understanding.  Jaynee Eagles, PA-C [facility] [phone] 06/27/2016 9:16 AM

## 2016-06-30 LAB — T3, FREE: T3, Free: 2.6 pg/mL (ref 2.3–4.2)

## 2016-06-30 LAB — T4, FREE: FREE T4: 1 ng/dL (ref 0.8–1.8)

## 2016-07-03 ENCOUNTER — Ambulatory Visit: Payer: BLUE CROSS/BLUE SHIELD | Admitting: Skilled Nursing Facility1

## 2016-07-09 ENCOUNTER — Ambulatory Visit: Payer: BLUE CROSS/BLUE SHIELD

## 2016-07-18 ENCOUNTER — Other Ambulatory Visit: Payer: Self-pay | Admitting: Physician Assistant

## 2016-07-18 ENCOUNTER — Other Ambulatory Visit: Payer: Self-pay | Admitting: Family Medicine

## 2016-07-18 DIAGNOSIS — E119 Type 2 diabetes mellitus without complications: Secondary | ICD-10-CM

## 2016-07-23 ENCOUNTER — Other Ambulatory Visit: Payer: Self-pay | Admitting: Family Medicine

## 2016-07-23 DIAGNOSIS — E119 Type 2 diabetes mellitus without complications: Secondary | ICD-10-CM

## 2016-07-24 ENCOUNTER — Ambulatory Visit (INDEPENDENT_AMBULATORY_CARE_PROVIDER_SITE_OTHER): Payer: BLUE CROSS/BLUE SHIELD | Admitting: Family Medicine

## 2016-07-24 ENCOUNTER — Encounter: Payer: Self-pay | Admitting: Family Medicine

## 2016-07-24 VITALS — BP 138/82 | HR 85 | Resp 16 | Wt 287.0 lb

## 2016-07-24 DIAGNOSIS — I10 Essential (primary) hypertension: Secondary | ICD-10-CM | POA: Diagnosis not present

## 2016-07-24 DIAGNOSIS — L659 Nonscarring hair loss, unspecified: Secondary | ICD-10-CM

## 2016-07-24 DIAGNOSIS — Z23 Encounter for immunization: Secondary | ICD-10-CM

## 2016-07-24 DIAGNOSIS — E119 Type 2 diabetes mellitus without complications: Secondary | ICD-10-CM

## 2016-07-24 DIAGNOSIS — K219 Gastro-esophageal reflux disease without esophagitis: Secondary | ICD-10-CM

## 2016-07-24 DIAGNOSIS — F329 Major depressive disorder, single episode, unspecified: Secondary | ICD-10-CM

## 2016-07-24 LAB — CBC WITH DIFFERENTIAL/PLATELET
Basophils Absolute: 112 cells/uL (ref 0–200)
Basophils Relative: 1 %
EOS PCT: 2 %
Eosinophils Absolute: 224 cells/uL (ref 15–500)
HCT: 44.1 % (ref 35.0–45.0)
HEMOGLOBIN: 14.6 g/dL (ref 11.7–15.5)
LYMPHS PCT: 26 %
Lymphs Abs: 2912 cells/uL (ref 850–3900)
MCH: 29 pg (ref 27.0–33.0)
MCHC: 33.1 g/dL (ref 32.0–36.0)
MCV: 87.5 fL (ref 80.0–100.0)
MONO ABS: 896 {cells}/uL (ref 200–950)
MONOS PCT: 8 %
MPV: 10 fL (ref 7.5–12.5)
NEUTROS PCT: 63 %
Neutro Abs: 7056 cells/uL (ref 1500–7800)
PLATELETS: 384 10*3/uL (ref 140–400)
RBC: 5.04 MIL/uL (ref 3.80–5.10)
RDW: 13.7 % (ref 11.0–15.0)
WBC: 11.2 10*3/uL — AB (ref 3.8–10.8)

## 2016-07-24 LAB — COMPREHENSIVE METABOLIC PANEL
ALBUMIN: 4.2 g/dL (ref 3.6–5.1)
ALT: 33 U/L — ABNORMAL HIGH (ref 6–29)
AST: 25 U/L (ref 10–30)
Alkaline Phosphatase: 69 U/L (ref 33–115)
BILIRUBIN TOTAL: 0.4 mg/dL (ref 0.2–1.2)
BUN: 14 mg/dL (ref 7–25)
CO2: 28 mmol/L (ref 20–31)
CREATININE: 0.75 mg/dL (ref 0.50–1.10)
Calcium: 9.6 mg/dL (ref 8.6–10.2)
Chloride: 100 mmol/L (ref 98–110)
GLUCOSE: 106 mg/dL — AB (ref 65–99)
Potassium: 3.5 mmol/L (ref 3.5–5.3)
SODIUM: 140 mmol/L (ref 135–146)
Total Protein: 7 g/dL (ref 6.1–8.1)

## 2016-07-24 LAB — LIPID PANEL
Cholesterol: 198 mg/dL (ref 125–200)
HDL: 40 mg/dL — ABNORMAL LOW (ref >=46)
LDL CALC: 111 mg/dL (ref <130)
Total CHOL/HDL Ratio: 5 Ratio (ref <=5.0)
Triglycerides: 233 mg/dL — ABNORMAL HIGH (ref <150)
VLDL: 47 mg/dL — AB (ref <30)

## 2016-07-24 LAB — TSH: TSH: 19.76 m[IU]/L — AB

## 2016-07-24 LAB — IRON: IRON: 54 ug/dL (ref 40–190)

## 2016-07-24 LAB — T4, FREE: FREE T4: 0.9 ng/dL (ref 0.8–1.8)

## 2016-07-24 MED ORDER — CANAGLIFLOZIN 100 MG PO TABS: 100.0000 mg | ORAL_TABLET | Freq: Every day | ORAL | 3 refills | Status: DC

## 2016-07-24 MED ORDER — HYDROCHLOROTHIAZIDE 12.5 MG PO TABS: 12.5000 mg | ORAL_TABLET | Freq: Every day | ORAL | 1 refills | Status: DC

## 2016-07-24 MED ORDER — SAXAGLIPTIN HCL 5 MG PO TABS: 5.0000 mg | ORAL_TABLET | Freq: Every day | ORAL | 3 refills | Status: DC

## 2016-07-24 MED ORDER — METFORMIN HCL 500 MG PO TABS: ORAL_TABLET | ORAL | 3 refills | Status: DC

## 2016-07-24 MED ORDER — METOPROLOL SUCCINATE ER 25 MG PO TB24: 25.0000 mg | ORAL_TABLET | Freq: Every day | ORAL | 3 refills | Status: DC

## 2016-07-24 MED ORDER — LISINOPRIL 10 MG PO TABS: 10.0000 mg | ORAL_TABLET | Freq: Every day | ORAL | 3 refills | Status: DC

## 2016-07-24 MED ORDER — LORATADINE 10 MG PO TABS: 10.0000 mg | ORAL_TABLET | Freq: Every day | ORAL | 3 refills | Status: DC

## 2016-07-24 NOTE — Progress Notes (Signed)
Subjective:    Patient ID: [name], female    DOB: 04-22-1976, 40 y.o.   MRN: [id number]  07/24/2016  Follow-up (pt wouldn't tell what exactly she wanted to be seen for)   HPI This 40 y.o. female presents for evaluation of the following:  1.  B knee pain: one week ago, sat down to clean for several hours.  Upon awakening, knees were numb.  Put weight on L knee; felt something move on inside.  From that day, having L knee pain laterally and inferior to patella.  Feels heaviness.  No swelling now; was swollen with first day.  No ice.  Took Naproxen.   2.  Obesity: has follow-up appointment on 07/27/16 with bariatric surgery.  S/p nutrition consultation; s/p UGI. Psychologist.    3.  HTN: Patient reports good compliance with medication, good tolerance to medication, and good symptom control.    4.  Family planning: using condoms.  Does not want to get pregnant since   5. DMII: Patient reports good compliance with medication, good tolerance to medication, and good symptom control.  Home readings are good.  Hair loss: having excessive hair loss for the past three months; recent treatment for hyperthyroidism; worried that thyroid contributing to rapid hair loss.  Review of Systems  Constitutional: Negative for chills, diaphoresis, fatigue and fever.  Eyes: Negative for visual disturbance.  Respiratory: Negative for cough and shortness of breath.   Cardiovascular: Negative for chest pain, palpitations and leg swelling.  Gastrointestinal: Negative for abdominal pain, constipation, diarrhea, nausea and vomiting.  Endocrine: Negative for cold intolerance, heat intolerance, polydipsia, polyphagia and polyuria.  Musculoskeletal: Positive for arthralgias and gait problem. Negative for joint swelling.  Neurological: Negative for dizziness, tremors, seizures, syncope, facial asymmetry, speech difficulty, weakness, light-headedness, numbness and headaches.    Past Medical History:    Diagnosis Date  . Carpal tunnel syndrome, bilateral   . Depression   . GERD (gastroesophageal reflux disease)   . H. pylori infection    dx  09-02-2014-- treated w/ antibiotic  . Hypertension   . Irregular menstrual cycle   . Menorrhagia   . PCOS (polycystic ovarian syndrome)   . Type 2 diabetes mellitus ([place])    Past Surgical History:  Procedure Laterality Date  . CARPAL TUNNEL RELEASE Bilateral 10/07/2014   Procedure: CARPAL TUNNEL RELEASE BILATERAL;  Surgeon: [name], MD;  Location: [facility];  Service: Orthopedics;  Laterality: Bilateral;  . CESAREAN SECTION  1995   Allergies  Allergen Reactions  . 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 Shrimp [Shellfish Allergy] Anaphylaxis    Pt states she no longer has a reaction to shrimp    Social History   Social History  . Marital status: Married    Spouse name: N/A  . Number of children: 2  . Years of education: N/A   Occupational History  . Not on file.   Social History Main Topics  . Smoking status: Never Smoker  . Smokeless tobacco: Never Used  . Alcohol use 0.0 oz/week     Comment: socially  . Drug use: No  . Sexual activity: Yes    Partners: Male    Birth control/ protection: None   Other Topics Concern  . Not on file   Social History Narrative   Marital status: married x 1 year; from [place].  Moved to [place] since 2000.       Children:  2 children (22, 22); one grandchildren (5)  Lives:  With husband      Employment:  [employer] full time;       Tobacco: none      Alcohol: social      Drugs: none      Exercise: none   No reported caffeine use    Family History  Problem Relation Age of Onset  . Heart disease Mother 80    CAD  . Diabetes Mother   . Hypertension Mother   . Cancer Father 11    prostate cancer  . Diabetes Father   . Thyroid disease Neg Hx        Objective:    BP 138/82 (BP Location: Right Arm, Patient Position: Sitting, Cuff Size: Large)   Pulse 85   Resp  16   Wt 287 lb (130.2 kg)   LMP 07/24/2016   SpO2 97%   BMI 52.92 kg/m  Physical Exam  Constitutional: She is oriented to person, place, and time. She appears well-developed and well-nourished. No distress.  HENT:  Head: Normocephalic and atraumatic.  Right Ear: External ear normal.  Left Ear: External ear normal.  Nose: Nose normal.  Mouth/Throat: Oropharynx is clear and moist.  Eyes: Conjunctivae and EOM are normal. Pupils are equal, round, and reactive to light.  Neck: Normal range of motion. Neck supple. Carotid bruit is not present. No thyromegaly present.  Cardiovascular: Normal rate, regular rhythm, normal heart sounds and intact distal pulses.  Exam reveals no gallop and no friction rub.   No murmur heard. Pulmonary/Chest: Effort normal and breath sounds normal. She has no wheezes. She has no rales.  Abdominal: Soft. Bowel sounds are normal. She exhibits no distension and no mass. There is no tenderness. There is no rebound and no guarding.  Musculoskeletal:       Right knee: Normal. She exhibits normal range of motion and no swelling. No tenderness found.       Left knee: Normal. She exhibits normal range of motion, no swelling and no effusion. No tenderness found.  Lymphadenopathy:    She has no cervical adenopathy.  Neurological: She is alert and oriented to person, place, and time. No cranial nerve deficit.  Skin: Skin is warm and dry. No rash noted. She is not diaphoretic. No erythema. No pallor.  Psychiatric: She has a normal mood and affect. Her behavior is normal.        Assessment & Plan:   1. Essential hypertension   2. Type 2 diabetes mellitus without complication, without long-term current use of insulin ([place])   3. Depression   4. Gastroesophageal reflux disease without esophagitis   5. Hair loss   6. Need for prophylactic vaccination and inoculation against influenza    -controlled; obtain labs; refills provided. -obtain labs including thyroid labs with  recent hair los.    Orders Placed This Encounter  Procedures  . Flu Vaccine QUAD 36+ mos IM  . CBC with Differential/Platelet  . Comprehensive metabolic panel    Order Specific Question:   Has the patient fasted?    Answer:   Yes  . Lipid panel    Order Specific Question:   Has the patient fasted?    Answer:   Yes  . Iron  . TSH  . T4, free  . EKG 12-Lead   Meds ordered this encounter  Medications  . lisinopril (PRINIVIL,ZESTRIL) 10 MG tablet    Sig: Take 1 tablet (10 mg total) by mouth daily.    Dispense:  90 tablet  Refill:  3  . canagliflozin (INVOKANA) 100 MG TABS tablet    Sig: Take 1 tablet (100 mg total) by mouth daily.    Dispense:  90 tablet    Refill:  3  . saxagliptin HCl (ONGLYZA) 5 MG TABS tablet    Sig: Take 1 tablet (5 mg total) by mouth daily.    Dispense:  90 tablet    Refill:  3  . metoprolol succinate (TOPROL-XL) 25 MG 24 hr tablet    Sig: Take 1 tablet (25 mg total) by mouth daily.    Dispense:  90 tablet    Refill:  3  . metFORMIN (GLUCOPHAGE) 500 MG tablet    Sig: Take 2 tabs by mouth (1000 mg ) 2 times daily.    Dispense:  360 tablet    Refill:  3  . loratadine (CLARITIN) 10 MG tablet    Sig: Take 1 tablet (10 mg total) by mouth daily.    Dispense:  90 tablet    Refill:  3  . hydrochlorothiazide (HYDRODIURIL) 12.5 MG tablet    Sig: Take 1 tablet (12.5 mg total) by mouth daily.    Dispense:  90 tablet    Refill:  1    Return in about 3 months (around 10/23/2016) for recheck diabetes.   Krystyn Picking Elayne Guerin, M.D. [facility] [address] [place], [place]  [address] [phone] phone [phone] fax

## 2016-07-24 NOTE — Patient Instructions (Signed)
     IF you received an x-ray today, you will receive an invoice from [facility]. Please contact [facility] at [phone] with questions or concerns regarding your invoice.   IF you received labwork today, you will receive an invoice from [facility]. Please contact [facility] at [phone] with questions or concerns regarding your invoice.   Our billing staff will not be able to assist you with questions regarding bills from these companies.  You will be contacted with the lab results as soon as they are available. The fastest way to get your results is to activate your My Chart account. Instructions are located on the last page of this paperwork. If you have not heard from us regarding the results in 2 weeks, please contact this office.      

## 2016-07-31 ENCOUNTER — Ambulatory Visit (INDEPENDENT_AMBULATORY_CARE_PROVIDER_SITE_OTHER): Payer: BLUE CROSS/BLUE SHIELD | Admitting: Family Medicine

## 2016-07-31 ENCOUNTER — Encounter: Payer: Self-pay | Admitting: Family Medicine

## 2016-07-31 VITALS — BP 124/80 | HR 88 | Temp 98.7°F | Resp 18 | Ht 61.75 in | Wt 283.0 lb

## 2016-07-31 DIAGNOSIS — Z91013 Allergy to seafood: Secondary | ICD-10-CM | POA: Diagnosis not present

## 2016-07-31 DIAGNOSIS — R112 Nausea with vomiting, unspecified: Secondary | ICD-10-CM

## 2016-07-31 DIAGNOSIS — L500 Allergic urticaria: Secondary | ICD-10-CM | POA: Diagnosis not present

## 2016-07-31 LAB — CBC WITH DIFFERENTIAL/PLATELET
BASOS PCT: 0 %
Basophils Absolute: 0 cells/uL (ref 0–200)
EOS ABS: 194 {cells}/uL (ref 15–500)
EOS PCT: 2 %
HCT: 43.2 % (ref 35.0–45.0)
Hemoglobin: 14.4 g/dL (ref 11.7–15.5)
LYMPHS PCT: 31 %
Lymphs Abs: 3007 cells/uL (ref 850–3900)
MCH: 29 pg (ref 27.0–33.0)
MCHC: 33.3 g/dL (ref 32.0–36.0)
MCV: 86.9 fL (ref 80.0–100.0)
MONOS PCT: 7 %
MPV: 9.6 fL (ref 7.5–12.5)
Monocytes Absolute: 679 cells/uL (ref 200–950)
Neutro Abs: 5820 cells/uL (ref 1500–7800)
Neutrophils Relative %: 60 %
PLATELETS: 337 10*3/uL (ref 140–400)
RBC: 4.97 MIL/uL (ref 3.80–5.10)
RDW: 13.3 % (ref 11.0–15.0)
WBC: 9.7 10*3/uL (ref 3.8–10.8)

## 2016-07-31 MED ORDER — RANITIDINE HCL 150 MG PO TABS
150.0000 mg | ORAL_TABLET | Freq: Once | ORAL | Status: AC
  Administered 2016-07-31: 150 mg via ORAL

## 2016-07-31 MED ORDER — PREDNISONE 20 MG PO TABS: ORAL_TABLET | ORAL | 0 refills | Status: DC

## 2016-07-31 MED ORDER — EPINEPHRINE 0.3 MG/0.3ML IJ SOAJ: 0.3000 mg | Freq: Once | INTRAMUSCULAR | 2 refills | Status: DC

## 2016-07-31 NOTE — Patient Instructions (Addendum)
1. Present to emergency department for worsening shortness of breath, throat swelling, or tongue swelling.    IF you received an x-ray today, you will receive an invoice from [facility]. Please contact [facility] at [phone] with questions or concerns regarding your invoice.   IF you received labwork today, you will receive an invoice from [facility]. Please contact [facility] at [phone] with questions or concerns regarding your invoice.   Our billing staff will not be able to assist you with questions regarding bills from these companies.  You will be contacted with the lab results as soon as they are available. The fastest way to get your results is to activate your My Chart account. Instructions are located on the last page of this paperwork. If you have not heard from [place] regarding the results in 2 weeks, please contact this office.     Alergia a los frutos de mar (Seafood Allergy) La alergia a los frutos de mar consiste en una reaccin al pescado o a los mariscos. Al tener una alergia, el sistema de defensa del cuerpo (sistema inmunitario) reacciona en exceso a una sustancia que normalmente no es perjudicial (alrgeno). Si usted es Air cabin crew a los frutos de mar, al consumir pescado o mariscos su cuerpo Firefighter como si estos fuesen sustancias peligrosas. En algunos casos, la alergia al pescado o a los mariscos puede causar una reaccin grave y potencialmente mortal que dificulte la respiracin (anafilaxia). La alergia a los mariscos es una de las ms frecuentes. Este tipo de reaccin es ms probable con determinados tipos de mariscos, entre ellos, cangrejo, [place] y camarones. A menudo, la alergia a los mariscos no comienza [employer] edad Ida. Es menos frecuente tener una alergia a los pescados con [place], como el atn, el halibut o el salmn. La alergia al pescado, en general, tampoco comienza [employer]. El hecho de tener  alergia a los mariscos no significa que la persona sea IT consultant a los pescados con [employer], y Secondary school teacher. Es posible Best boy solo una de Engineer, maintenance o ser alrgico tanto al pescado como a los mariscos. CAUSAS  La alergia a los frutos de mar se produce por la reaccin excesiva del sistema inmunitario frente a una protena de los frutos de mar. Los sntomas de Buyer, retail aparecen cuando el sistema inmunitario libera sustancias qumicas en respuesta a esa protena. [place] riesgo de [place] a los frutos de mar puede ser mayor si ya se tiene otro tipo de Buyer, retail. SIGNOS Y SNTOMAS  [employer] sntomas de la alergia a los [place], se incluyen los siguientes:  Picazn o hinchazn alrededor de la boca.  Sibilancias.  Tos.  Sensacin de que se Advertising account executive.  Dificultad para tragar.  Ronchas.  Clicos intestinales.  Vmitos.  Diarrea.  Pulso dbil.  Palidez.  Sensacin de confusin o mareos. Entre los sntomas de la alergia al pescado, se incluyen los siguientes:  Erupcin o ronchas en la piel.  Clicos intestinales.  Nuseas y vmitos.  Diarrea.  Congestin nasal.  Estornudos.  Dolores de [place].  Asma.  Problemas respiratorios.  Sensacin de confusin o mareos. DIAGNSTICO  El mdico puede sospechar una alergia a los frutos de mar segn los signos y sntomas. Se le practicar un examen fsico. Es posible que deba ver a un mdico especialista en el tratamiento de Set designer (Social worker). Este mdico puede solicitar estudios como ayuda para confirmar el diagnstico. Estos pueden incluir:  Anlisis de [place].  Pueden hacerse anlisis  de sangre para Hydrographic surveyor anticuerpos contra el pescado o los mariscos evaluados. Los anticuerpos son protenas que el cuerpo [place] para protegerlo contra los microbios y otras cosas que pueden enfermarlo.  Pruebas de puncin.  En esta prueba, se coloca en el brazo una pequea cantidad de un lquido que contiene la sustancia  causante de la Buyer, retail.  Luego, esta zona se punza con una aguja libre de microbios (estril) para que algo del lquido se introduzca en la piel.  Si aparece una zona rojiza despus de alrededor de 51minutos, esto podra significar que usted es alrgico a esa sustancia.  Prueba de tolerancia oral a los alimentos.  Esta prueba, que se hace bajo supervisin, demuestra cmo responde el cuerpo frente a pequeas cantidades de alimentos que podran estar causando los sntomas. TRATAMIENTO El tratamiento principal de [place] a los frutos de Chief Financial Officer los alimentos que causan [place]. El tratamiento tambin puede incluir lo siguiente:  Un medicamento (antihistamnico o corticoide) que [employer].  Un plan de tratamiento de emergencia que incluya inyectar epinefrina si surge una reaccin anafilctica.  El mdico le dar un plan de emergencia y Surveyor, minerals a usar el inyector de epinefrina. INSTRUCCIONES PARA EL CUIDADO EN EL HOGAR  No coma el pescado o los mariscos que le causen Buyer, retail.  Lea con cuidado las etiquetas de los alimentos. El pescado y los mariscos pueden ser ingredientes de salsas, caldos y otros productos.  Si sale a Counselling psychologist, avsele al mozo que es alrgico a determinados alimentos. Pregunte cmo se preparan los alimentos.  Tome los medicamentos solamente como se lo haya indicado el mdico.  Asegrese de entender el plan de tratamiento de emergencia.  Asegrese de saber cmo se [place] el inyector de epinefrina.  Lleve siempre con usted dos dosis por si tiene una reaccin anafilctica. Asegrese de que el medicamento no haya vencido. SOLICITE ATENCIN MDICA SI: Contina teniendo sntomas despus del tratamiento. SOLICITE ATENCIN MDICA DE INMEDIATO SI:  Tiene dificultad para respirar.  Comi accidentalmente algo de pescado o mariscos que le causan Buyer, retail.  [place] el inyector de epinefrina. Debe solicitar atencin de emergencia lo antes  posible despus de Therapist, music. ASEGRESE DE QUE:  Comprende estas instrucciones.  Controlar su afeccin.  Recibir ayuda de inmediato si no mejora o si empeora.   Esta informacin no tiene Marine scientist el consejo del mdico. Asegrese de hacerle al mdico cualquier pregunta que tenga.   Document Released: 07/25/2005 Document Revised: 11/05/2014 Elsevier Interactive Patient Education [employer].

## 2016-07-31 NOTE — Progress Notes (Signed)
Subjective:    Patient ID: [name], female    DOB: [birth year], 40 y.o.   MRN: [id number]  07/31/2016  Allergic Reaction ([place]) and Medication Refill (EPI-PEN)   HPI This 40 y.o. female presents for evaluation of shrimp exposure/allergy last night.  Friends brought over food containing shrimp.  Developed diffuse itching on hands, legs, back.  Developed rash all over.  +facial swelling.  Face became really red. Developed swelling in lips.  Husband noticed swelling.  Occurred yesterday. Used Epipen but it was expired.  Leg hurt after injecting Epipen. Ambulance evaluated patient at home; oxygen was normal; BP was normal.  Not transported to ED because started improving.  Spoke with HR today; did not feel well; recommended leaving after four hours today at work.  Having vomiting today.  Ate half sandwich at 11:15am; has not vomited it yet.  Breathing seems slightly abnormal.  No Benadryl. Did take Spanish Benadryl but vomited afterwards; took another one with milk last night. Still itching.     Review of Systems  Constitutional: Negative for chills, diaphoresis, fatigue and fever.  HENT: Positive for facial swelling. Negative for mouth sores and trouble swallowing.   Eyes: Negative for visual disturbance.  Respiratory: Positive for shortness of breath. Negative for cough and wheezing.   Cardiovascular: Negative for chest pain, palpitations and leg swelling.  Gastrointestinal: Positive for vomiting. Negative for abdominal pain, constipation, diarrhea and nausea.  Endocrine: Negative for cold intolerance, heat intolerance, polydipsia, polyphagia and polyuria.  Skin: Positive for rash.  Neurological: Negative for dizziness, tremors, seizures, syncope, facial asymmetry, speech difficulty, weakness, light-headedness, numbness and headaches.    Past Medical History:  Diagnosis Date  . Carpal tunnel syndrome, bilateral   . Depression   . GERD (gastroesophageal reflux disease)   . H.  pylori infection    dx  09-02-2014-- treated w/ antibiotic  . Hypertension   . Irregular menstrual cycle   . Menorrhagia   . PCOS (polycystic ovarian syndrome)   . Type 2 diabetes mellitus ([place])    Past Surgical History:  Procedure Laterality Date  . CARPAL TUNNEL RELEASE Bilateral 10/07/2014   Procedure: CARPAL TUNNEL RELEASE BILATERAL;  Surgeon: [name], MD;  Location: [facility];  Service: Orthopedics;  Laterality: Bilateral;  . CESAREAN SECTION  1995   Allergies  Allergen Reactions  . 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 Shrimp [Shellfish Allergy] Anaphylaxis    Pt states she no longer has a reaction to shrimp    Social History   Social History  . Marital status: Married    Spouse name: N/A  . Number of children: 2  . Years of education: N/A   Occupational History  . Not on file.   Social History Main Topics  . Smoking status: Never Smoker  . Smokeless tobacco: Never Used  . Alcohol use 0.0 oz/week     Comment: socially  . Drug use: No  . Sexual activity: Yes    Partners: Male    Birth control/ protection: None   Other Topics Concern  . Not on file   Social History Narrative   Marital status: married x 1 year; from [place].  Moved to [place] since 2000.       Children:  2 children (22, 22); one grandchildren (5)      Lives:  With husband      Employment:  [employer] full time;       Tobacco: none      Alcohol:  social      Drugs: none      Exercise: none   No reported caffeine use    Family History  Problem Relation Age of Onset  . Heart disease Mother 65    CAD  . Diabetes Mother   . Hypertension Mother   . Cancer Father 108    prostate cancer  . Diabetes Father   . Thyroid disease Neg Hx        Objective:    BP 124/80 (BP Location: Right Arm, Patient Position: Sitting, Cuff Size: Large)   Pulse 88   Temp 98.7 F (37.1 C) (Oral)   Resp 18   Ht 5' 1.75" (1.568 m)   Wt 283 lb (128.4 kg)   LMP 07/24/2016   SpO2 98%   BMI  52.18 kg/m  Physical Exam  Constitutional: She is oriented to person, place, and time. She appears well-developed and well-nourished. No distress.  HENT:  Head: Normocephalic and atraumatic.  Right Ear: Tympanic membrane, external ear and ear canal normal.  Left Ear: Tympanic membrane, external ear and ear canal normal.  Nose: Nose normal.  Mouth/Throat: Uvula is midline, oropharynx is clear and moist and mucous membranes are normal. No oral lesions. No uvula swelling. No oropharyngeal exudate, posterior oropharyngeal edema or posterior oropharyngeal erythema.  +lower lip with mild swelling diffusely.   Eyes: Conjunctivae and EOM are normal. Pupils are equal, round, and reactive to light.  Neck: Normal range of motion. Neck supple. Carotid bruit is not present. No thyromegaly present.  Cardiovascular: Normal rate, regular rhythm, normal heart sounds and intact distal pulses.  Exam reveals no gallop and no friction rub.   No murmur heard. Pulmonary/Chest: Effort normal and breath sounds normal. She has no wheezes. She has no rales.  Abdominal: Soft. Bowel sounds are normal. She exhibits no distension and no mass. There is no tenderness. There is no rebound and no guarding.  Lymphadenopathy:    She has no cervical adenopathy.  Neurological: She is alert and oriented to person, place, and time. No cranial nerve deficit.  Skin: Skin is warm and dry. Rash noted. She is not diaphoretic. No erythema. No pallor.  Scattered mild maculopapular rash along B forearms.  Psychiatric: She has a normal mood and affect. Her behavior is normal.   Results for orders placed or performed in visit on 07/31/16  CBC with Differential/Platelet  Result Value Ref Range   WBC 9.7 3.8 - 10.8 K/uL   RBC 4.97 3.80 - 5.10 MIL/uL   Hemoglobin 14.4 11.7 - 15.5 g/dL   HCT 43.2 35.0 - 45.0 %   MCV 86.9 80.0 - 100.0 fL   MCH 29.0 27.0 - 33.0 pg   MCHC 33.3 32.0 - 36.0 g/dL   RDW 13.3 11.0 - 15.0 %   Platelets 337 140  - 400 K/uL   MPV 9.6 7.5 - 12.5 fL   Neutro Abs 5,820 1,500 - 7,800 cells/uL   Lymphs Abs 3,007 850 - 3,900 cells/uL   Monocytes Absolute 679 200 - 950 cells/uL   Eosinophils Absolute 194 15 - 500 cells/uL   Basophils Absolute 0 0 - 200 cells/uL   Neutrophils Relative % 60 %   Lymphocytes Relative 31 %   Monocytes Relative 7 %   Eosinophils Relative 2 %   Basophils Relative 0 %   Smear Review Criteria for review not met   Comprehensive metabolic panel  Result Value Ref Range   Sodium  135 - 146 mmol/L  Potassium  3.5 - 5.3 mmol/L   Chloride  98 - 110 mmol/L   CO2  20 - 31 mmol/L   Glucose, Bld  65 - 99 mg/dL   BUN  7 - 25 mg/dL   Creat  0.60 - 1.35 mg/dL   Total Bilirubin  0.2 - 1.2 mg/dL   Alkaline Phosphatase  40 - 115 U/L   AST  10 - 40 U/L   ALT  9 - 46 U/L   Total Protein  6.1 - 8.1 g/dL   Albumin  3.6 - 5.1 g/dL   Calcium  8.6 - 10.3 mg/dL       Assessment & Plan:   1. Shrimp allergy   2. Nausea and vomiting, intractability of vomiting not specified, unspecified vomiting type   3. Allergic urticaria    -recurrent; possible calamari versus shrimp allergic reaction. -s/p Zantac in office -continue Claritin daily. -rx for Prednisone and Epipen provided. -recommend BRAT diet, hydration. -To ED for worsening facial swelling, tongue swelling, throat swelling, SOB.   Orders Placed This Encounter  Procedures  . CBC with Differential/Platelet  . Comprehensive metabolic panel   Meds ordered this encounter  Medications  . ranitidine (ZANTAC) tablet 150 mg  . EPINEPHrine (EPIPEN 2-PAK) 0.3 mg/0.3 mL IJ SOAJ injection    Sig: Inject 0.3 mLs (0.3 mg total) into the muscle once.    Dispense:  1 Device    Refill:  2  . predniSONE (DELTASONE) 20 MG tablet    Sig: Take 3 PO QAM x 1 day, 2 PO QAM x 5 days, 1 PO QAM x 5 days    Dispense:  18 tablet    Refill:  0    No Follow-up on file.   Kristi Elayne Guerin, M.D. [facility] [address] [place], [place]  [address] [phone] phone [phone] fax

## 2016-08-01 ENCOUNTER — Telehealth: Payer: Self-pay

## 2016-08-01 ENCOUNTER — Ambulatory Visit: Payer: BLUE CROSS/BLUE SHIELD | Admitting: Family Medicine

## 2016-08-01 LAB — COMPREHENSIVE METABOLIC PANEL
ALT: 24 U/L (ref 6–29)
AST: 21 U/L (ref 10–30)
Albumin: 4.1 g/dL (ref 3.6–5.1)
Alkaline Phosphatase: 68 U/L (ref 33–115)
BILIRUBIN TOTAL: 0.4 mg/dL (ref 0.2–1.2)
BUN: 15 mg/dL (ref 7–25)
CHLORIDE: 101 mmol/L (ref 98–110)
CO2: 28 mmol/L (ref 20–31)
Calcium: 9.5 mg/dL (ref 8.6–10.2)
Creat: 0.7 mg/dL (ref 0.50–1.10)
Glucose, Bld: 87 mg/dL (ref 65–99)
POTASSIUM: 4.1 mmol/L (ref 3.5–5.3)
SODIUM: 139 mmol/L (ref 135–146)
Total Protein: 7.1 g/dL (ref 6.1–8.1)

## 2016-08-01 MED ORDER — EPINEPHRINE 0.3 MG/0.3ML IJ SOAJ: 0.3000 mg | Freq: Once | INTRAMUSCULAR | 2 refills | Status: AC

## 2016-08-01 NOTE — Telephone Encounter (Signed)
PATIENT STATES SHE WAS IN THE OFFICE YESTERDAY (TUES) AND SAW DR. Tamala Julian. SHE CALLED IN ALL OF HER MEDICATIONS BUT WHEN SHE WENT TO THE PHARMACY SHE HAD NOT REFILLED HER EPI-PEN. SHE WOULD LIKE TO GET THAT DONE AS SOON AS POSSIBLE.  BEST PHONE [phone] (CELL-PLEASE LEAVE HER A MESSAGE BECAUSE SHE WILL BE AT WORK AND CAN NOT ANSWER THE PHONE)  [facility]ICE IS [facility] ON [address]  [place]

## 2016-08-01 NOTE — Telephone Encounter (Signed)
I see that it was ok yesterday, resent today by me.[place] Left message for patient.

## 2016-08-14 ENCOUNTER — Encounter: Payer: Self-pay | Admitting: Family Medicine

## 2016-08-14 ENCOUNTER — Ambulatory Visit (INDEPENDENT_AMBULATORY_CARE_PROVIDER_SITE_OTHER): Payer: BLUE CROSS/BLUE SHIELD | Admitting: Family Medicine

## 2016-08-14 VITALS — BP 128/80 | HR 81 | Temp 98.2°F | Resp 17 | Ht 62.0 in | Wt 287.0 lb

## 2016-08-14 DIAGNOSIS — N939 Abnormal uterine and vaginal bleeding, unspecified: Secondary | ICD-10-CM

## 2016-08-14 LAB — POC MICROSCOPIC URINALYSIS (UMFC): MUCUS RE: ABSENT

## 2016-08-14 LAB — POCT URINALYSIS DIP (MANUAL ENTRY)
Ketones, POC UA: NEGATIVE
LEUKOCYTES UA: NEGATIVE
NITRITE UA: NEGATIVE
PH UA: 5
Protein Ur, POC: 100 — AB
Spec Grav, UA: 1.025
UROBILINOGEN UA: 0.2

## 2016-08-14 LAB — POCT URINE PREGNANCY: PREG TEST UR: NEGATIVE

## 2016-08-14 LAB — POCT CBC
GRANULOCYTE PERCENT: 59.1 % (ref 37–80)
HEMATOCRIT: 42 % (ref 37.7–47.9)
HEMOGLOBIN: 14.7 g/dL (ref 12.2–16.2)
LYMPH, POC: 3.2 (ref 0.6–3.4)
MCH, POC: 29.6 pg (ref 27–31.2)
MCHC: 35 g/dL (ref 31.8–35.4)
MCV: 84.6 fL (ref 80–97)
MID (CBC): 0.8 (ref 0–0.9)
MPV: 6.6 fL (ref 0–99.8)
POC GRANULOCYTE: 5.9 (ref 2–6.9)
POC LYMPH PERCENT: 32.7 %L (ref 10–50)
POC MID %: 8.2 % (ref 0–12)
Platelet Count, POC: 337 10*3/uL (ref 142–424)
RBC: 4.96 M/uL (ref 4.04–5.48)
RDW, POC: 13.7 %
WBC: 9.9 10*3/uL (ref 4.6–10.2)

## 2016-08-14 NOTE — Progress Notes (Signed)
Patient ID: [name], female    DOB: January 11, 1976, 40 y.o.   MRN: [id number]  PCP: Reginia Forts, MD  Chief Complaint  Patient presents with  . Vaginal Bleeding    Subjective:   HPI 40 year old presents for evaluation of vaginal bleeding. She reports that her last menstrual cycle was September 26 and ended around October 2nd.  She reports around noon on October 14th she began to experience vaginal bleeding which has increased in flow over the last three days. She reports thicken clots and continued heavy flow. The patient suffers from hypothyroidism and stopped taking her medication in later September as she felt is causing hair loss. Her last TSH was 19.76 which was taken 3 weeks ago. She last saw her OB/GYN. Denies burning with urination. She complains of urination urgency.  She became angry subsequently decided to leave the office. She spoke with another provider and decided to return and have labs drawn and urine checked. She reports that she doesn't understand why she is having to take Synthroid/Levothyroxine. She thought once the hyperthyroidism was corrected she would not need to take medication. She is very frustrated due to missing work to come into the clinic today.   Social History   Social History  . Marital status: Married    Spouse name: N/A  . Number of children: 2  . Years of education: N/A   Occupational History  . Not on file.   Social History Main Topics  . Smoking status: Never Smoker  . Smokeless tobacco: Never Used  . Alcohol use 0.0 oz/week     Comment: socially  . Drug use: No  . Sexual activity: Yes    Partners: Male    Birth control/ protection: None   Other Topics Concern  . Not on file   Social History Narrative   Marital status: married x 1 year; from [place].  Moved to [place] since 2000.       Children:  2 children (22, 22); one grandchildren (5)      Lives:  With husband      Employment:  [employer] full time;       Tobacco: none   Alcohol: social      Drugs: none      Exercise: none   No reported caffeine use    Family History  Problem Relation Age of Onset  . Heart disease Mother 60    CAD  . Diabetes Mother   . Hypertension Mother   . Cancer Father 1    prostate cancer  . Diabetes Father   . Thyroid disease Neg Hx    Review of Systems See HPI   Patient Active Problem List   Diagnosis Date Noted  . Hyperthyroidism 07/28/2015  . Essential hypertension, benign 06/29/2015  . Allergic rhinitis due to pollen 06/29/2015  . Anxiety and depression 06/29/2015  . Gastroesophageal reflux disease without esophagitis 06/29/2015  . OSA on CPAP 06/29/2015  . History of syphilis 05/23/2015  . Amenorrhea 05/23/2015  . Severe obesity (BMI >= 40) ([place]) 09/14/2014  . Irregular menstrual cycle 11/12/2013  . Type II diabetes mellitus, uncontrolled ([place]) 06/20/2009  . POLYCYSTIC OVARIAN DISEASE 06/20/2009  . CARPAL TUNNEL SYNDROME 06/20/2009     Prior to Admission medications   Medication Sig Start Date End Date Taking? Authorizing Provider  canagliflozin (INVOKANA) 100 MG TABS tablet Take 1 tablet (100 mg total) by mouth daily. 07/24/16  Yes Wardell Honour, MD  clobetasol cream (TEMOVATE) 0.05 %  APPLY ONE APPLICATION TOPICALLY TWO TIMES DAILY FOR 10 DAYS 03/24/16  Yes Wardell Honour, MD  diclofenac (VOLTAREN) 75 MG EC tablet Take 1 tablet (75 mg total) by mouth 2 (two) times daily as needed for mild pain. 06/19/16  Yes Wendie Agreste, MD  FLUoxetine (PROZAC) 20 MG tablet Take 2 tablets (40 mg total) by mouth daily. 11/01/15  Yes Ezekiel Slocumb, PA-C  glimepiride (AMARYL) 4 MG tablet Take 2 tablets (8 mg total) by mouth 2 (two) times daily. 04/16/16  Yes Shawnee Knapp, MD  hydrochlorothiazide (HYDRODIURIL) 12.5 MG tablet Take 1 tablet (12.5 mg total) by mouth daily. 07/24/16  Yes Wardell Honour, MD  levothyroxine (SYNTHROID, LEVOTHROID) 50 MCG tablet Take 1 tablet (50 mcg total) by mouth daily. 05/11/16  Yes Renato Shin, MD    lisinopril (PRINIVIL,ZESTRIL) 10 MG tablet Take 1 tablet (10 mg total) by mouth daily. 07/24/16  Yes Wardell Honour, MD  loratadine (CLARITIN) 10 MG tablet Take 1 tablet (10 mg total) by mouth daily. 07/24/16  Yes Wardell Honour, MD  medroxyPROGESTERone (PROVERA) 10 MG tablet Take one trablet daily for 10 days of the month if no spontaneous menses every 30 days IF negative home UPT 05/23/15  Yes Terrance Mass, MD  metFORMIN (GLUCOPHAGE) 500 MG tablet Take 2 tabs by mouth (1000 mg ) 2 times daily. 07/24/16  Yes Wardell Honour, MD  methocarbamol (ROBAXIN) 750 MG tablet Take 1 tablet (750 mg total) by mouth every 6 (six) hours as needed for muscle spasms. 06/19/16  Yes Wendie Agreste, MD  metoprolol succinate (TOPROL-XL) 25 MG 24 hr tablet Take 1 tablet (25 mg total) by mouth daily. 07/24/16  Yes Wardell Honour, MD  nystatin (MYCOSTATIN/NYSTOP) 100000 UNIT/GM POWD Apply 500,000 g topically 2 (two) times daily. 04/16/16  Yes Shawnee Knapp, MD  nystatin cream (MYCOSTATIN) Apply 1 application topically 2 (two) times daily. 12/22/15  Yes Wardell Honour, MD  nystatin-triamcinolone ointment [facility]) Apply 1 application topically 2 (two) times daily. 04/16/16  Yes Shawnee Knapp, MD  predniSONE (DELTASONE) 20 MG tablet Take 3 PO QAM x 1 day, 2 PO QAM x 5 days, 1 PO QAM x 5 days 07/31/16  Yes Wardell Honour, MD  saxagliptin HCl (ONGLYZA) 5 MG TABS tablet Take 1 tablet (5 mg total) by mouth daily. 07/24/16  Yes Wardell Honour, MD  valACYclovir (VALTREX) 500 MG tablet Take 1 tablet (500 mg total) by mouth 2 (two) times daily. 04/10/16  Yes Wendie Agreste, MD   Allergies  Allergen Reactions  . 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 Shrimp [Shellfish Allergy] Anaphylaxis    Pt states she no longer has a reaction to shrimp      Objective:  Physical Exam  Constitutional: She is oriented to person, place, and time. She appears well-developed and well-nourished.  HENT:  Head: Normocephalic and atraumatic.  Right Ear:  External ear normal.  Left Ear: External ear normal.  Mouth/Throat: Oropharynx is clear and moist.  Eyes: Conjunctivae are normal. Pupils are equal, round, and reactive to light.  Neck: Normal range of motion. Neck supple.  Cardiovascular: Normal rate, regular rhythm, normal heart sounds and intact distal pulses.   Pulmonary/Chest: Effort normal and breath sounds normal.  Musculoskeletal: Normal range of motion.  Neurological: She is alert and oriented to person, place, and time.  Skin: Skin is warm and dry.  Psychiatric: She has a normal mood and affect. Her behavior is  normal. Thought content normal.       Vitals:   08/14/16 1136  BP: 128/80  Pulse: 81  Resp: 17  Temp: 98.2 F (36.8 C)   Assessment & Plan:  1. Vaginal bleeding - FSH/LH - Thyroid Panel With TSH - Ferritin - CBC with Differential/Platelet - Ambulatory referral to Obstetrics / Gynecology - POCT Microscopic Urinalysis ([facility]) - POCT urinalysis dipstick - POCT urine pregnancy  40 year old female presents with irregular vaginal bleeding. She recently completed her menstrual cycle and experienced an acute onset of bleeding times 4 days. She has a history of hypothyroidism and has stopped taking her  Levothyroxine/Synthroid due to she felt as the medication was causing her hair to fall.  Her last TSH 19.76. She is being referred to OB/GYN stat, Dr. Toney Rakes as she is established with him. Checking hemoglobin to ensure stable due to acute bleeding.  Plan: Referred to OB/GYN Urgently. Hemoglobin stable. Labs results pending. Letter provided for patient's employer advising of the need for frequent restroom breaks.    Carroll Sage. Tonnette Zwiebel, MSN, FNP-C [facility]   Total of 30 minutes  spent with pt., greater than 50% which was spent in discussion of indications for thyroid replacement, hyperthyroidism vs. Hypothyroidism, and blood infusion indications related to  hemoglobin.

## 2016-08-14 NOTE — Patient Instructions (Addendum)
I have referred you back to your GYN to evaluate irregular bleeding.  I will notify you of your labs results.    IF you received an x-ray today, you will receive an invoice from [facility]. Please contact [facility] at [phone] with questions or concerns regarding your invoice.   IF you received labwork today, you will receive an invoice from [employer]. Please contact [facility] at [phone] with questions or concerns regarding your invoice.   Our billing staff will not be able to assist you with questions regarding bills from these companies.  You will be contacted with the lab results as soon as they are available. The fastest way to get your results is to activate your My Chart account. Instructions are located on the last page of this paperwork. If you have not heard from [place] regarding the results in 2 weeks, please contact this office.     Primary Amenorrhea Primary amenorrhea is the absence of any menstrual flow in a female by the age of 55 years. An average age for the start of menstruation is the age of 67 years. Primary amenorrhea is not considered to have occurred until a female is older than 15 years and has never menstruated. This may occur with or without other signs of puberty. CAUSES  Some common causes of not menstruating include:  Chromosomal abnormality causing the ovaries to malfunction is the most common cause of primary amenorrhea.  Malnutrition.  Low blood sugar (hypoglycemia).  Polycystic ovary syndrome (cysts in the ovaries, not ovulating).  Absence of the vagina, uterus, or ovaries since birth (congenital).  Extreme obesity.  Cystic fibrosis.  Drastic weight loss from any cause.  Over-exercising (running, biking) causing loss of body fat.  Pituitary gland tumor in the brain.  Long-term (chronic) illnesses.  Cushing disease.  Thyroid disease (hypothyroidism, hyperthyroidism).  Part of the brain  (hypothalamus) not functioning normally.  Premature ovarian failure. SYMPTOMS  No menstruation by age 37 years in normally developed females is the primary symptom. Other symptoms may include:  Discharge from the breasts.  Hot flashes.  Adult acne.  Facial or chest hair.  Headaches.  Impaired vision.  Recent stress.  Changes in weight, diet, or exercise patterns. DIAGNOSIS  Primary amenorrhea is diagnosed with the help of a medical history and a physical exam. Other tests that may be recommended include:  Blood tests to check for pregnancy, hormonal changes, a bleeding or thyroid disorder, low iron levels (anemia), or other problems.  Urine tests.  Specialized X-ray exams. TREATMENT  Treatment will depend on the cause. For example, some of the causes of primary amenorrhea, such as congenital absence of sex organs, will require surgery to correct. Others may respond to treatment with medicine. SEEK MEDICAL CARE IF:  There has not been any menstrual flow by age 50 years.  Body maturation does not occur at a level typical of peers.  Pelvic area pain occurs.  There is unusual weight gain or hair growth.   This information is not intended to replace advice given to you by your health care provider. Make sure you discuss any questions you have with your health care provider.   Document Released: 10/15/2005 Document Revised: 11/05/2014 Document Reviewed: 05/27/2013 Elsevier Interactive Patient Education [employer].

## 2016-08-15 LAB — THYROID PANEL WITH TSH
Free Thyroxine Index: 1.1 — ABNORMAL LOW (ref 1.4–3.8)
T3 Uptake: 24 % (ref 22–35)
T4 TOTAL: 4.7 ug/dL (ref 4.5–12.0)
TSH: 24.1 mIU/L — ABNORMAL HIGH

## 2016-08-15 LAB — FSH/LH
FSH: 4.5 m[IU]/mL
LH: 2.5 m[IU]/mL

## 2016-08-15 LAB — FERRITIN: Ferritin: 65 ng/mL (ref 10–232)

## 2016-08-22 ENCOUNTER — Telehealth: Payer: Self-pay | Admitting: Emergency Medicine

## 2016-08-22 NOTE — Telephone Encounter (Signed)
-----   Message from [name], [place] sent at 08/16/2016 11:38 AM EDT ----- Please call patient to advise TSH is steadily progressively increasing. She should resume of levothyroxine or follow-up and make an appointment with Dr. Renato Shin, her endocrinologist.  Carroll Sage. Kenton Kingfisher, MSN, FNP-C [facility]

## 2016-09-03 ENCOUNTER — Other Ambulatory Visit: Payer: Self-pay | Admitting: Family Medicine

## 2016-09-05 NOTE — Telephone Encounter (Signed)
07/2016 last ov 

## 2016-09-09 NOTE — Progress Notes (Signed)
Subjective:    Patient ID: [name], female    DOB: 09/09/1976, 40 y.o.   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 pt states she feels no different, and well in general.  She has moderate hair loss on the head, and assoc weight gain.  She resumed the synthroid 4 weeks ago.   Past Medical History:  Diagnosis Date  . Carpal tunnel syndrome, bilateral   . Depression   . GERD (gastroesophageal reflux disease)   . H. pylori infection    dx  09-02-2014-- treated w/ antibiotic  . Hypertension   . Irregular menstrual cycle   . Menorrhagia   . PCOS (polycystic ovarian syndrome)   . Type 2 diabetes mellitus ([place])     Past Surgical History:  Procedure Laterality Date  . CARPAL TUNNEL RELEASE Bilateral 10/07/2014   Procedure: CARPAL TUNNEL RELEASE BILATERAL;  Surgeon: [name], MD;  Location[facility];  Service: Orthopedics;  Laterality: Bilateral;  . [place]    Social History   Social History  . Marital status: Married    Spouse name: N/A  . Number of children: 2  . Years of education: N/A   Occupational History  . Not on file.   Social History Main Topics  . Smoking status: Never Smoker  . Smokeless tobacco: Never Used  . Alcohol use 0.0 oz/week     Comment: socially  . Drug use: No  . Sexual activity: Yes    Partners: Male    Birth control/ protection: None   Other Topics Concern  . Not on file   Social History Narrative   Marital status: married x 1 year; from [place].  Moved to [place] since 2000.       Children:  2 children (22, 22); one grandchildren (5)      Lives:  With husband      Employment:  [employer] full time;       Tobacco: none      Alcohol: social      Drugs: none      Exercise: none   No reported caffeine use     Current Outpatient  Prescriptions on File Prior to Visit  Medication Sig Dispense Refill  . canagliflozin (INVOKANA) 100 MG TABS tablet Take 1 tablet (100 mg total) by mouth daily. 90 tablet 3  . clobetasol cream (TEMOVATE) [id number] % APPLY ONE APPLICATION TOPICALLY TWICE DAILY FOR 10 DAYS 60 g 0  . diclofenac (VOLTAREN) 75 MG EC tablet Take 1 tablet (75 mg total) by mouth 2 (two) times daily as needed for mild pain. 30 tablet 0  . FLUoxetine (PROZAC) 20 MG tablet Take 2 tablets (40 mg total) by mouth daily. 60 tablet 5  . glimepiride (AMARYL) 4 MG tablet Take 2 tablets (8 mg total) by mouth 2 (two) times daily. 360 tablet 0  . hydrochlorothiazide (HYDRODIURIL) 12.5 MG tablet Take 1 tablet (12.5 mg total) by mouth daily. 90 tablet 1  . lisinopril (PRINIVIL,ZESTRIL) 10 MG tablet Take 1 tablet (10 mg total) by mouth daily. 90 tablet 3  . loratadine (CLARITIN) 10 MG tablet Take 1 tablet (10 mg total) by mouth daily. 90 tablet 3  . medroxyPROGESTERone (PROVERA) 10 MG tablet Take one trablet daily for 10 days of the month if  no spontaneous menses every 30 days IF negative home UPT 30 tablet 4  . metFORMIN (GLUCOPHAGE) 500 MG tablet Take 2 tabs by mouth (1000 mg ) 2 times daily. 360 tablet 3  . methocarbamol (ROBAXIN) 750 MG tablet Take 1 tablet (750 mg total) by mouth every 6 (six) hours as needed for muscle spasms. 30 tablet 0  . metoprolol succinate (TOPROL-XL) 25 MG 24 hr tablet Take 1 tablet (25 mg total) by mouth daily. 90 tablet 3  . nystatin (MYCOSTATIN/NYSTOP) 100000 UNIT/GM POWD Apply 500,000 g topically 2 (two) times daily. 180 g 2  . nystatin cream (MYCOSTATIN) Apply 1 application topically 2 (two) times daily. 30 g 0  . nystatin-triamcinolone ointment (MYCOLOG) Apply 1 application topically 2 (two) times daily. 60 g 1  . omeprazole (PRILOSEC) 20 MG capsule TAKE ONE CAPSULE BY MOUTH ONCE DAILY 90 capsule 1  . saxagliptin HCl (ONGLYZA) 5 MG TABS tablet Take 1 tablet (5 mg total) by mouth daily. 90 tablet 3  .  valACYclovir (VALTREX) 500 MG tablet Take 1 tablet (500 mg total) by mouth 2 (two) times daily. 6 tablet 2   No current facility-administered medications on file prior to visit.     Allergies  Allergen Reactions  . 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 Shrimp [Shellfish Allergy] Anaphylaxis    Pt states she no longer has a reaction to shrimp    Family History  Problem Relation Age of Onset  . Heart disease Mother 21    CAD  . Diabetes Mother   . Hypertension Mother   . Cancer Father 26    prostate cancer  . Diabetes Father   . Thyroid disease Neg Hx    BP 128/86   Pulse 87   Ht 5\' 2"  (1.575 m)   Wt 283 lb (128.4 kg)   SpO2 98%   BMI 51.76 kg/m   Review of Systems Denies edema.      Objective:   Physical Exam VITAL SIGNS:  See vs page GENERAL: no distress NECK: There is no palpable thyroid enlargement.  No thyroid nodule is palpable.  No palpable lymphadenopathy at the anterior neck.   Lab Results  Component Value Date   TSH 19.51 (H) 09/11/2016   T4TOTAL 4.7 08/14/2016      Assessment & Plan:  Hypothyroidism: she needs increased rx.  I have sent a prescription to your pharmacy.

## 2016-09-11 ENCOUNTER — Ambulatory Visit (INDEPENDENT_AMBULATORY_CARE_PROVIDER_SITE_OTHER): Payer: BLUE CROSS/BLUE SHIELD | Admitting: Endocrinology

## 2016-09-11 ENCOUNTER — Encounter: Payer: Self-pay | Admitting: Endocrinology

## 2016-09-11 DIAGNOSIS — E039 Hypothyroidism, unspecified: Secondary | ICD-10-CM | POA: Insufficient documentation

## 2016-09-11 DIAGNOSIS — E890 Postprocedural hypothyroidism: Secondary | ICD-10-CM

## 2016-09-11 LAB — T4, FREE: Free T4: 0.63 ng/dL (ref 0.60–1.60)

## 2016-09-11 LAB — TSH: TSH: 19.51 u[IU]/mL — AB (ref 0.35–4.50)

## 2016-09-11 NOTE — Patient Instructions (Addendum)
blood tests are requested for you today.  We'll let you know about the results. The results will probably say your thyroid is still too low.  This is probably why you have the symptoms.  Increasing the medication will help.  Please come back for a follow-up appointment in 2 months.

## 2016-09-12 MED ORDER — LEVOTHYROXINE SODIUM 100 MCG PO TABS: 100.0000 ug | ORAL_TABLET | Freq: Every day | ORAL | 3 refills | Status: DC

## 2016-09-27 ENCOUNTER — Other Ambulatory Visit: Payer: Self-pay | Admitting: Family Medicine

## 2016-09-30 NOTE — Telephone Encounter (Signed)
06/2016 last ov 07/2016 last lab

## 2016-10-04 ENCOUNTER — Ambulatory Visit (INDEPENDENT_AMBULATORY_CARE_PROVIDER_SITE_OTHER): Payer: BLUE CROSS/BLUE SHIELD | Admitting: Family Medicine

## 2016-10-04 VITALS — BP 114/74 | HR 86 | Temp 98.3°F | Resp 17 | Ht 62.0 in | Wt 283.0 lb

## 2016-10-04 DIAGNOSIS — F430 Acute stress reaction: Secondary | ICD-10-CM | POA: Diagnosis not present

## 2016-10-04 DIAGNOSIS — I10 Essential (primary) hypertension: Secondary | ICD-10-CM | POA: Diagnosis not present

## 2016-10-04 DIAGNOSIS — E119 Type 2 diabetes mellitus without complications: Secondary | ICD-10-CM

## 2016-10-04 DIAGNOSIS — N939 Abnormal uterine and vaginal bleeding, unspecified: Secondary | ICD-10-CM | POA: Diagnosis not present

## 2016-10-04 DIAGNOSIS — R0789 Other chest pain: Secondary | ICD-10-CM | POA: Diagnosis not present

## 2016-10-04 LAB — POCT GLYCOSYLATED HEMOGLOBIN (HGB A1C): Hemoglobin A1C: 7.4

## 2016-10-04 LAB — POCT URINALYSIS DIP (MANUAL ENTRY)
Bilirubin, UA: NEGATIVE
Blood, UA: NEGATIVE
Ketones, POC UA: NEGATIVE
LEUKOCYTES UA: NEGATIVE
Nitrite, UA: NEGATIVE
PROTEIN UA: NEGATIVE
SPEC GRAV UA: 1.02
UROBILINOGEN UA: 0.2
pH, UA: 5

## 2016-10-04 LAB — POCT URINE PREGNANCY: PREG TEST UR: NEGATIVE

## 2016-10-04 NOTE — Progress Notes (Signed)
Subjective:    Patient ID: [name], female    DOB: [birth year], 40 y.o.   MRN: [id number]  10/04/2016  Medication Refill (Pt states medication based on discussion with you) and Mass (On back. )   HPI This 40 y.o. female presents for evaluation for recent chest pain, headache, stress reaction. Had an argument with coworker that was very upsetting to patient.  Evaluated by nurse practitioner at work; Freight forwarder insisted on being evaluated at work.  Out of work until 09-28-2016.  Has been having bad dreams every day.  Dreamed that had an MVA with family members in the car.  Every time pt sees the coworker, pt develops headache and tachycardia. Married x 2 years this week.  Took blood pressure daily; has been taking extra blood pressure medication for past three days due to blood pressure 146/82.   Feeling well today.  Presented on 08/14/16 due to irregular vaginal bleeding.   FNP Kenton Kingfisher referred to gynecology for consultation.  Had been non-compliant with thyroid supplementation around the time of irregular vaginal bleeding.    BP Readings from Last 3 Encounters:  10/04/16 114/74  09/11/16 128/86  08/14/16 128/80   Wt Readings from Last 3 Encounters:  10/04/16 283 lb (128.4 kg)  09/11/16 283 lb (128.4 kg)  08/14/16 287 lb (130.2 kg)   Immunization History  Administered Date(s) Administered  . Influenza,inj,Quad PF,36+ Mos 06/29/2015, 07/24/2016  . Pneumococcal Polysaccharide-23 06/29/2015  . Tdap 06/29/2015    Irregular vaginal menses: menses 07-26-16; then started bleeding again 09-11-16 with recurrent menses but very heavy.  Had really heavy menses for four straight days.  Stopped bleeding after seven days.  Then had regular menses 09-19-16; then bled 3 days like normal.  Dr. Toney Rakes called two weeks later to offer an appointment.    Review of Systems  Constitutional: Negative for chills, diaphoresis, fatigue and fever.  Eyes: Negative for visual disturbance.  Respiratory:  Negative for cough and shortness of breath.   Cardiovascular: Negative for chest pain, palpitations and leg swelling.  Gastrointestinal: Negative for abdominal pain, constipation, diarrhea, nausea and vomiting.  Endocrine: Negative for cold intolerance, heat intolerance, polydipsia, polyphagia and polyuria.  Genitourinary: Positive for menstrual problem.  Neurological: Negative for dizziness, tremors, seizures, syncope, facial asymmetry, speech difficulty, weakness, light-headedness, numbness and headaches.  Psychiatric/Behavioral: Positive for dysphoric mood and sleep disturbance. The patient is nervous/anxious.     Past Medical History:  Diagnosis Date  . Carpal tunnel syndrome, bilateral   . Depression   . GERD (gastroesophageal reflux disease)   . H. pylori infection    dx  09-02-2014-- treated w/ antibiotic  . Hypertension   . Irregular menstrual cycle   . Menorrhagia   . PCOS (polycystic ovarian syndrome)   . Type 2 diabetes mellitus ([place])    Past Surgical History:  Procedure Laterality Date  . CARPAL TUNNEL RELEASE Bilateral 10/07/2014   Procedure: CARPAL TUNNEL RELEASE BILATERAL;  Surgeon: [name], MD;  Location[facility];  Service: Orthopedics;  Laterality: Bilateral;  . CESAREAN SECTION  1995   Allergies  Allergen Reactions  . 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 Shrimp [Shellfish Allergy] Anaphylaxis    Pt states she no longer has a reaction to shrimp   Current Outpatient Prescriptions  Medication Sig Dispense Refill  . canagliflozin (INVOKANA) 100 MG TABS tablet Take 1 tablet (100 mg total) by mouth daily. 90 tablet 3  . glimepiride (AMARYL) 4 MG tablet Take 2 tablets (8  mg total) by mouth 2 (two) times daily. 360 tablet 0  . hydrochlorothiazide (HYDRODIURIL) 12.5 MG tablet Take 1 tablet (12.5 mg total) by mouth daily. 90 tablet 1  . hydrochlorothiazide (HYDRODIURIL) 25 MG tablet TAKE ONE TABLET BY MOUTH ONCE DAILY 90 tablet 0  .  levothyroxine (SYNTHROID, LEVOTHROID) 100 MCG tablet Take 1 tablet (100 mcg total) by mouth daily. 90 tablet 3  . lisinopril (PRINIVIL,ZESTRIL) 10 MG tablet Take 1 tablet (10 mg total) by mouth daily. 90 tablet 3  . loratadine (CLARITIN) 10 MG tablet Take 1 tablet (10 mg total) by mouth daily. 90 tablet 3  . medroxyPROGESTERone (PROVERA) 10 MG tablet Take one trablet daily for 10 days of the month if no spontaneous menses every 30 days IF negative home UPT 30 tablet 4  . metFORMIN (GLUCOPHAGE) 500 MG tablet Take 2 tabs by mouth (1000 mg ) 2 times daily. 360 tablet 3  . nystatin (MYCOSTATIN/NYSTOP) 100000 UNIT/GM POWD Apply 500,000 g topically 2 (two) times daily. 180 g 2  . nystatin cream (MYCOSTATIN) Apply 1 application topically 2 (two) times daily. 30 g 0  . nystatin-triamcinolone ointment (MYCOLOG) Apply 1 application topically 2 (two) times daily. 60 g 1  . saxagliptin HCl (ONGLYZA) 5 MG TABS tablet Take 1 tablet (5 mg total) by mouth daily. 90 tablet 3  . FLUoxetine (PROZAC) 20 MG tablet Take 2 tablets (40 mg total) by mouth daily. (Patient not taking: Reported on 10/04/2016) 60 tablet 5  . methocarbamol (ROBAXIN) 750 MG tablet Take 1 tablet (750 mg total) by mouth every 6 (six) hours as needed for muscle spasms. (Patient not taking: Reported on 10/04/2016) 30 tablet 0   No current facility-administered medications for this visit.    Social History   Social History  . Marital status: Married    Spouse name: N/A  . Number of children: 2  . Years of education: N/A   Occupational History  . Not on file.   Social History Main Topics  . Smoking status: Never Smoker  . Smokeless tobacco: Never Used  . Alcohol use 0.0 oz/week     Comment: socially  . Drug use: No  . Sexual activity: Yes    Partners: Male    Birth control/ protection: None   Other Topics Concern  . Not on file   Social History Narrative   Marital status: married x 1 year; from [place].  Moved to [place] since 2000.        Children:  2 children (22, 22); one grandchildren (5)      Lives:  With husband      Employment:  [employer] full time;       Tobacco: none      Alcohol: social      Drugs: none      Exercise: none   No reported caffeine use    Family History  Problem Relation Age of Onset  . Heart disease Mother 45    CAD  . Diabetes Mother   . Hypertension Mother   . Cancer Father 79    prostate cancer  . Diabetes Father   . Thyroid disease Neg Hx        Objective:    BP 114/74 (BP Location: Left Arm, Patient Position: Sitting, Cuff Size: Large)   Pulse 86   Temp 98.3 F (36.8 C) (Oral)   Resp 17   Ht 5\' 2"  (1.575 m)   Wt 283 lb (128.4 kg)   SpO2 97%  BMI 51.76 kg/m  Physical Exam  Constitutional: She is oriented to person, place, and time. She appears well-developed and well-nourished. No distress.  HENT:  Head: Normocephalic and atraumatic.  Right Ear: External ear normal.  Left Ear: External ear normal.  Nose: Nose normal.  Mouth/Throat: Oropharynx is clear and moist.  Eyes: Conjunctivae and EOM are normal. Pupils are equal, round, and reactive to light.  Neck: Normal range of motion. Neck supple. Carotid bruit is not present. No thyromegaly present.  Cardiovascular: Normal rate, regular rhythm, normal heart sounds and intact distal pulses.  Exam reveals no gallop and no friction rub.   No murmur heard. Pulmonary/Chest: Effort normal and breath sounds normal. She has no wheezes. She has no rales.  Abdominal: Soft. Bowel sounds are normal. She exhibits no distension and no mass. There is no tenderness. There is no rebound and no guarding.  Lymphadenopathy:    She has no cervical adenopathy.  Neurological: She is alert and oriented to person, place, and time. No cranial nerve deficit.  Skin: Skin is warm and dry. No rash noted. She is not diaphoretic. No erythema. No pallor.  Psychiatric: She has a normal mood and affect. Her behavior is normal.   Results for orders placed or  performed in visit on 10/04/16  CBC with Differential/Platelet  Result Value Ref Range   WBC 9.2 3.4 - 10.8 x10E3/uL   RBC 4.80 3.77 - 5.28 x10E6/uL   Hemoglobin 13.7 11.1 - 15.9 g/dL   Hematocrit 42.2 34.0 - 46.6 %   MCV 88 79 - 97 fL   MCH 28.5 26.6 - 33.0 pg   MCHC 32.5 31.5 - 35.7 g/dL   RDW 14.0 12.3 - 15.4 %   Platelets 365 150 - 379 x10E3/uL   Neutrophils 58 Not Estab. %   Lymphs 31 Not Estab. %   Monocytes 7 Not Estab. %   Eos 3 Not Estab. %   Basos 0 Not Estab. %   Neutrophils Absolute 5.4 1.4 - 7.0 x10E3/uL   Lymphocytes Absolute 2.8 0.7 - 3.1 x10E3/uL   Monocytes Absolute 0.6 0.1 - 0.9 x10E3/uL   EOS (ABSOLUTE) 0.2 0.0 - 0.4 x10E3/uL   Basophils Absolute 0.0 0.0 - 0.2 x10E3/uL   Immature Granulocytes 1 Not Estab. %   Immature Grans (Abs) 0.1 0.0 - 0.1 x10E3/uL  Comprehensive metabolic panel  Result Value Ref Range   Glucose 110 (H) 65 - 99 mg/dL   BUN 16 6 - 24 mg/dL   Creatinine, Ser 0.79 0.57 - 1.00 mg/dL   GFR calc non Af Amer 94 >59 mL/min/1.73   GFR calc Af Amer 108 >59 mL/min/1.73   BUN/Creatinine Ratio 20 9 - 23   Sodium 142 134 - 144 mmol/L   Potassium 4.0 3.5 - 5.2 mmol/L   Chloride 99 96 - 106 mmol/L   CO2 28 18 - 29 mmol/L   Calcium 9.4 8.7 - 10.2 mg/dL   Total Protein 7.0 6.0 - 8.5 g/dL   Albumin 4.3 3.5 - 5.5 g/dL   Globulin, Total 2.7 1.5 - 4.5 g/dL   Albumin/Globulin Ratio 1.6 1.2 - 2.2   Bilirubin Total 0.2 0.0 - 1.2 mg/dL   Alkaline Phosphatase 75 39 - 117 IU/L   AST 17 0 - 40 IU/L   ALT 26 0 - 32 IU/L  POCT glycosylated hemoglobin (Hb A1C)  Result Value Ref Range   Hemoglobin A1C 7.4   POCT urinalysis dipstick  Result Value Ref Range   Color, UA  yellow yellow   Clarity, UA clear clear   Glucose, UA >=1,000 (A) negative   Bilirubin, UA negative negative   Ketones, POC UA negative negative   Spec Grav, UA 1.020    Blood, UA negative negative   pH, UA 5.0    Protein Ur, POC negative negative   Urobilinogen, UA 0.2    Nitrite, UA  Negative Negative   Leukocytes, UA Negative Negative  POCT urine pregnancy  Result Value Ref Range   Preg Test, Ur Negative Negative       Assessment & Plan:   1. Type 2 diabetes mellitus without complication, without long-term current use of insulin (HCC)   2. Other chest pain   3. Essential hypertension, benign   4. Vaginal bleeding   5. Acute stress reaction    -new onset acute stress reaction due to argument with coworker; blood pressure elevated at work and developed chest pain; EKG stable in office today; has been chest pain free since event; no exertional component to chest pain.  Consistent with stress reaction.  Blood pressure now improved. -irregular vaginal bleeding onset in 06/2016.   Urine pregnancy negative; warrants gynecological consultation for endometrial bx and pelvic US.  Likely due to recent thyroid fluctuation.   -HgbA1c has worsened since last visit; encourage improved compliance with dietary modification.  -continue current antihypertensive regimen; continue to monitor blood pressure daily at home.  Orders Placed This Encounter  Procedures  . CBC with Differential/Platelet  . Comprehensive metabolic panel  . POCT glycosylated hemoglobin (Hb A1C)  . POCT urinalysis dipstick  . POCT urine pregnancy  . EKG 12-Lead   No orders of the defined types were placed in this encounter.   No Follow-up on file.   Rollande Thursby Elayne Guerin, M.D. [facility] [address] [place], [place]  [address] [phone] phone [phone] fax

## 2016-10-04 NOTE — Patient Instructions (Signed)
     IF you received an x-ray today, you will receive an invoice from [facility]. Please contact [facility] at [phone] with questions or concerns regarding your invoice.   IF you received labwork today, you will receive an invoice from [facility]. Please contact [facility] at [phone] with questions or concerns regarding your invoice.   Our billing staff will not be able to assist you with questions regarding bills from these companies.  You will be contacted with the lab results as soon as they are available. The fastest way to get your results is to activate your My Chart account. Instructions are located on the last page of this paperwork. If you have not heard from us regarding the results in 2 weeks, please contact this office.      

## 2016-10-05 LAB — COMPREHENSIVE METABOLIC PANEL
A/G RATIO: 1.6 (ref 1.2–2.2)
ALBUMIN: 4.3 g/dL (ref 3.5–5.5)
ALT: 26 IU/L (ref 0–32)
AST: 17 IU/L (ref 0–40)
Alkaline Phosphatase: 75 IU/L (ref 39–117)
BUN / CREAT RATIO: 20 (ref 9–23)
BUN: 16 mg/dL (ref 6–24)
Bilirubin Total: 0.2 mg/dL (ref 0.0–1.2)
CALCIUM: 9.4 mg/dL (ref 8.7–10.2)
CO2: 28 mmol/L (ref 18–29)
CREATININE: 0.79 mg/dL (ref 0.57–1.00)
Chloride: 99 mmol/L (ref 96–106)
GFR, EST AFRICAN AMERICAN: 108 mL/min/{1.73_m2} (ref >59)
GFR, EST NON AFRICAN AMERICAN: 94 mL/min/{1.73_m2} (ref >59)
GLOBULIN, TOTAL: 2.7 g/dL (ref 1.5–4.5)
Glucose: 110 mg/dL — ABNORMAL HIGH (ref 65–99)
POTASSIUM: 4 mmol/L (ref 3.5–5.2)
SODIUM: 142 mmol/L (ref 134–144)
Total Protein: 7 g/dL (ref 6.0–8.5)

## 2016-10-05 LAB — CBC WITH DIFFERENTIAL/PLATELET
BASOS: 0 %
Basophils Absolute: 0 10*3/uL (ref 0.0–0.2)
EOS (ABSOLUTE): 0.2 10*3/uL (ref 0.0–0.4)
EOS: 3 %
HEMATOCRIT: 42.2 % (ref 34.0–46.6)
HEMOGLOBIN: 13.7 g/dL (ref 11.1–15.9)
IMMATURE GRANULOCYTES: 1 %
Immature Grans (Abs): 0.1 10*3/uL (ref 0.0–0.1)
LYMPHS ABS: 2.8 10*3/uL (ref 0.7–3.1)
Lymphs: 31 %
MCH: 28.5 pg (ref 26.6–33.0)
MCHC: 32.5 g/dL (ref 31.5–35.7)
MCV: 88 fL (ref 79–97)
MONOCYTES: 7 %
MONOS ABS: 0.6 10*3/uL (ref 0.1–0.9)
NEUTROS PCT: 58 %
Neutrophils Absolute: 5.4 10*3/uL (ref 1.4–7.0)
Platelets: 365 10*3/uL (ref 150–379)
RBC: 4.8 x10E6/uL (ref 3.77–5.28)
RDW: 14 % (ref 12.3–15.4)
WBC: 9.2 10*3/uL (ref 3.4–10.8)

## 2016-10-09 DIAGNOSIS — H25013 Cortical age-related cataract, bilateral: Secondary | ICD-10-CM | POA: Diagnosis not present

## 2016-10-09 DIAGNOSIS — H40013 Open angle with borderline findings, low risk, bilateral: Secondary | ICD-10-CM | POA: Diagnosis not present

## 2016-10-09 DIAGNOSIS — H11001 Unspecified pterygium of right eye: Secondary | ICD-10-CM | POA: Diagnosis not present

## 2016-10-09 DIAGNOSIS — E119 Type 2 diabetes mellitus without complications: Secondary | ICD-10-CM | POA: Diagnosis not present

## 2016-10-09 LAB — HM DIABETES EYE EXAM

## 2016-10-28 ENCOUNTER — Other Ambulatory Visit: Payer: Self-pay | Admitting: Family Medicine

## 2016-11-16 ENCOUNTER — Ambulatory Visit: Payer: BLUE CROSS/BLUE SHIELD | Admitting: Endocrinology

## 2016-12-12 ENCOUNTER — Ambulatory Visit: Payer: BLUE CROSS/BLUE SHIELD | Admitting: Family Medicine

## 2016-12-18 ENCOUNTER — Telehealth: Payer: Self-pay

## 2016-12-18 NOTE — Telephone Encounter (Signed)
invokana needs PA Id [id number]

## 2016-12-19 NOTE — Telephone Encounter (Signed)
Request sent to cover my meds.  Key code is [id number].  Waiting for response.

## 2016-12-25 NOTE — Telephone Encounter (Signed)
farixiga or jardiance is covered

## 2016-12-26 ENCOUNTER — Other Ambulatory Visit: Payer: Self-pay | Admitting: Family Medicine

## 2016-12-26 NOTE — Telephone Encounter (Signed)
See below regarding invokana -

## 2016-12-26 NOTE — Telephone Encounter (Signed)
Called patient and advised her that the invkana is not approved at this time, and the doctor will probably prescribe her another medication.  We will call her back after we get information from the provider.  She said that would be fine.

## 2016-12-27 NOTE — Telephone Encounter (Signed)
Meds ordered this encounter  Medications  . hydrochlorothiazide (HYDRODIURIL) 25 MG tablet    Sig: TAKE ONE TABLET BY MOUTH ONCE DAILY    Dispense:  90 tablet    Refill:  3    Patient has visit with Dr. Tamala Julian 01/02/2017

## 2016-12-28 MED ORDER — EMPAGLIFLOZIN 25 MG PO TABS: 25.0000 mg | ORAL_TABLET | Freq: Every day | ORAL | 3 refills | Status: DC

## 2016-12-28 NOTE — Telephone Encounter (Signed)
Insurance will no longer cover Invokana.  I have sent in [place] to replace [place].  Please call and advise pt.

## 2016-12-28 NOTE — Telephone Encounter (Signed)
Called patient and advised her that Dr. Tamala Julian had called her in another medication.  She said she knows because her pharmacy called her.  It is more expensive than the first medication.  She is going to call her insurance to find out if there is anything cheaper.  I told her if she finds anything then for her to call me back and I will get a message to Dr. Tamala Julian.  She said she will do this.

## 2017-01-02 ENCOUNTER — Ambulatory Visit (INDEPENDENT_AMBULATORY_CARE_PROVIDER_SITE_OTHER): Payer: Managed Care, Other (non HMO) | Admitting: Family Medicine

## 2017-01-02 ENCOUNTER — Encounter: Payer: Self-pay | Admitting: Family Medicine

## 2017-01-02 VITALS — BP 130/81 | HR 97 | Temp 98.6°F | Resp 18 | Ht 62.0 in | Wt 285.0 lb

## 2017-01-02 DIAGNOSIS — B372 Candidiasis of skin and nail: Secondary | ICD-10-CM | POA: Diagnosis not present

## 2017-01-02 DIAGNOSIS — E119 Type 2 diabetes mellitus without complications: Secondary | ICD-10-CM

## 2017-01-02 DIAGNOSIS — E890 Postprocedural hypothyroidism: Secondary | ICD-10-CM

## 2017-01-02 DIAGNOSIS — I10 Essential (primary) hypertension: Secondary | ICD-10-CM | POA: Diagnosis not present

## 2017-01-02 MED ORDER — GLIMEPIRIDE 4 MG PO TABS: 4.0000 mg | ORAL_TABLET | Freq: Two times a day (BID) | ORAL | 3 refills | Status: DC

## 2017-01-02 MED ORDER — NYSTATIN 100000 UNIT/GM EX CREA
1.0000 | TOPICAL_CREAM | Freq: Two times a day (BID) | CUTANEOUS | 0 refills | Status: DC
Start: 2017-01-02 — End: 2017-04-29

## 2017-01-02 MED ORDER — METFORMIN HCL 500 MG PO TABS: ORAL_TABLET | ORAL | 3 refills | Status: DC

## 2017-01-02 MED ORDER — GLIMEPIRIDE 4 MG PO TABS: 8.0000 mg | ORAL_TABLET | Freq: Two times a day (BID) | ORAL | 1 refills | Status: DC

## 2017-01-02 NOTE — Patient Instructions (Signed)
     IF you received an x-ray today, you will receive an invoice from [facility]. Please contact [facility] at [phone] with questions or concerns regarding your invoice.   IF you received labwork today, you will receive an invoice from [facility]. Please contact [facility] at [phone] with questions or concerns regarding your invoice.   Our billing staff will not be able to assist you with questions regarding bills from these companies.  You will be contacted with the lab results as soon as they are available. The fastest way to get your results is to activate your My Chart account. Instructions are located on the last page of this paperwork. If you have not heard from us regarding the results in 2 weeks, please contact this office.     

## 2017-01-02 NOTE — Progress Notes (Signed)
Subjective:    Patient ID: [name], female    DOB: [birth year], 41 y.o.   MRN: [id number]  01/02/2017  Follow-up (diabetes/medication)   HPI This 41 y.o. female presents for evaluation of DMII, hypertension, hypercholesterolemia.  Has been more compliant with medications for the past year.  Sugars have been much better controlled.  Previously, lots of medications added due to elevated sugars. Has improved compliance with diet.  Undergoing evaluation for gastric bypass.  Surgeon needs information from PCP.  Not exercising but work is very physical.  Has actually stopped taking Invokana and Jardiance.  Immunization History  Administered Date(s) Administered  . Influenza,inj,Quad PF,36+ Mos 06/29/2015, 07/24/2016  . Pneumococcal Polysaccharide-23 06/29/2015  . Tdap 06/29/2015   BP Readings from Last 3 Encounters:  01/02/17 130/81  10/04/16 114/74  09/11/16 128/86   Wt Readings from Last 3 Encounters:  01/02/17 285 lb (129.3 kg)  10/04/16 283 lb (128.4 kg)  09/11/16 283 lb (128.4 kg)    Review of Systems  Constitutional: Negative for chills, diaphoresis, fatigue and fever.  Eyes: Negative for visual disturbance.  Respiratory: Negative for cough and shortness of breath.   Cardiovascular: Negative for chest pain, palpitations and leg swelling.  Gastrointestinal: Negative for abdominal pain, constipation, diarrhea, nausea and vomiting.  Endocrine: Negative for cold intolerance, heat intolerance, polydipsia, polyphagia and polyuria.  Neurological: Negative for dizziness, tremors, seizures, syncope, facial asymmetry, speech difficulty, weakness, light-headedness, numbness and headaches.    Past Medical History:  Diagnosis Date  . Carpal tunnel syndrome, bilateral   . Depression   . GERD (gastroesophageal reflux disease)   . H. pylori infection    dx  09-02-2014-- treated w/ antibiotic  . Hypertension   . Irregular menstrual cycle   . Menorrhagia   . PCOS (polycystic  ovarian syndrome)   . Type 2 diabetes mellitus ([place])    Past Surgical History:  Procedure Laterality Date  . CARPAL TUNNEL RELEASE Bilateral 10/07/2014   Procedure: CARPAL TUNNEL RELEASE BILATERAL;  Surgeon: [name], MD;  Location: [facility];  Service: Orthopedics;  Laterality: Bilateral;  . CESAREAN SECTION  1995   Allergies  Allergen Reactions  . 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 Shrimp [Shellfish Allergy] Anaphylaxis    Pt states she no longer has a reaction to shrimp   Current Outpatient Prescriptions  Medication Sig Dispense Refill  . FLUoxetine (PROZAC) 20 MG tablet Take 2 tablets (40 mg total) by mouth daily. 60 tablet 5  . glimepiride (AMARYL) 4 MG tablet Take 1 tablet (4 mg total) by mouth 2 (two) times daily. 180 tablet 3  . levothyroxine (SYNTHROID, LEVOTHROID) 100 MCG tablet Take 1 tablet (100 mcg total) by mouth daily. 90 tablet 3  . lisinopril (PRINIVIL,ZESTRIL) 10 MG tablet Take 1 tablet (10 mg total) by mouth daily. 90 tablet 3  . loratadine (CLARITIN) 10 MG tablet Take 1 tablet (10 mg total) by mouth daily. 90 tablet 3  . medroxyPROGESTERone (PROVERA) 10 MG tablet Take one trablet daily for 10 days of the month if no spontaneous menses every 30 days IF negative home UPT 30 tablet 4  . metFORMIN (GLUCOPHAGE) 500 MG tablet Take 2 tabs by mouth (1000 mg ) 2 times daily. 360 tablet 3  . nystatin (MYCOSTATIN/NYSTOP) 100000 UNIT/GM POWD Apply 500,000 g topically 2 (two) times daily. 180 g 2  . saxagliptin HCl (ONGLYZA) 5 MG TABS tablet Take 1 tablet (5 mg total) by mouth daily. 90 tablet 3  .  canagliflozin (INVOKANA) 100 MG TABS tablet Take 1 tablet (100 mg total) by mouth daily. (Patient not taking: Reported on 01/02/2017) 90 tablet 3  . empagliflozin (JARDIANCE) 25 MG TABS tablet Take 25 mg by mouth daily. (Patient not taking: Reported on 01/02/2017) 90 tablet 3  . hydrochlorothiazide (HYDRODIURIL) 12.5 MG tablet Take 1 tablet (12.5 mg total) by mouth  daily. 90 tablet 1  . hydrochlorothiazide (HYDRODIURIL) 25 MG tablet TAKE ONE TABLET BY MOUTH ONCE DAILY (Patient not taking: Reported on 01/02/2017) 90 tablet 3  . methocarbamol (ROBAXIN) 750 MG tablet Take 1 tablet (750 mg total) by mouth every 6 (six) hours as needed for muscle spasms. (Patient not taking: Reported on 01/02/2017) 30 tablet 0  . nystatin cream (MYCOSTATIN) Apply 1 application topically 2 (two) times daily. 30 g 0  . nystatin-triamcinolone ointment (MYCOLOG) Apply 1 application topically 2 (two) times daily. (Patient not taking: Reported on 01/02/2017) 60 g 1   No current facility-administered medications for this visit.    Social History   Social History  . Marital status: Married    Spouse name: N/A  . Number of children: 2  . Years of education: N/A   Occupational History  . Not on file.   Social History Main Topics  . Smoking status: Never Smoker  . Smokeless tobacco: Never Used  . Alcohol use 0.0 oz/week     Comment: socially  . Drug use: No  . Sexual activity: Yes    Partners: Male    Birth control/ protection: None   Other Topics Concern  . Not on file   Social History Narrative   Marital status: married x 1 year; from [place].  Moved to [place] since 2000.       Children:  2 children (22, 22); one grandchildren (5)      Lives:  With husband      Employment:  [employer] full time;       Tobacco: none      Alcohol: social      Drugs: none      Exercise: none   No reported caffeine use    Family History  Problem Relation Age of Onset  . Heart disease Mother 31    CAD  . Diabetes Mother   . Hypertension Mother   . Cancer Father 2    prostate cancer  . Diabetes Father   . Thyroid disease Neg Hx        Objective:    BP 130/81 (BP Location: Right Arm, Patient Position: Sitting, Cuff Size: Large)   Pulse 97   Temp 98.6 F (37 C) (Oral)   Resp 18   Ht 5\' 2"  (1.575 m)   Wt 285 lb (129.3 kg)   LMP 12/26/2016   SpO2 97%   BMI 52.13 kg/m  Physical  Exam  Constitutional: She is oriented to person, place, and time. She appears well-developed and well-nourished. No distress.  HENT:  Head: Normocephalic and atraumatic.  Right Ear: External ear normal.  Left Ear: External ear normal.  Nose: Nose normal.  Mouth/Throat: Oropharynx is clear and moist.  Eyes: Conjunctivae and EOM are normal. Pupils are equal, round, and reactive to light.  Neck: Normal range of motion. Neck supple. Carotid bruit is not present. No thyromegaly present.  Cardiovascular: Normal rate, regular rhythm, normal heart sounds and intact distal pulses.  Exam reveals no gallop and no friction rub.   No murmur heard. Pulmonary/Chest: Effort normal and breath sounds normal. She has no  wheezes. She has no rales.  Abdominal: Soft. Bowel sounds are normal. She exhibits no distension and no mass. There is no tenderness. There is no rebound and no guarding.  Lymphadenopathy:    She has no cervical adenopathy.  Neurological: She is alert and oriented to person, place, and time. No cranial nerve deficit.  Skin: Skin is warm and dry. No rash noted. She is not diaphoretic. No erythema. No pallor.  Psychiatric: She has a normal mood and affect. Her behavior is normal.        Assessment & Plan:   1. Type 2 diabetes mellitus without complication, without long-term current use of insulin ([place])   2. Severe obesity (BMI >= 40) (HCC)   3. Postablative hypothyroidism   4. Essential hypertension, benign   5. Candidiasis of skin    -improved compliance with medications for the past year; thus, pt is requesting a decrease in oral diabetes medications; agreeable to three month trial off of Jardiance and Onglyza.  Obtain labs. -refill of nystatin cream for recurrent candidiasis.  -continues to suffer with severe obesity; has been monitoring ongoing attempts of weight loss and exercise.  Recommend ongoing weight loss attempts; highly encourage gastric bypass due to chronic obesity.   Recommend 1500kcal limitation per day; recommend protein 15grams per meal with a goal of 60 grams of protein per day.  Recommend limiting carbohydrate intake to 45 grams per meal.  Recommend exercising 45-60 minutes per day; current job is very physically demanding. Avoid any sweetened beverages.   Orders Placed This Encounter  Procedures  . Microalbumin/Creatinine Ratio, Urine  . Hemoglobin A1c  . Comprehensive metabolic panel  . TSH  . T4, free   Meds ordered this encounter  Medications  . metFORMIN (GLUCOPHAGE) 500 MG tablet    Sig: Take 2 tabs by mouth (1000 mg ) 2 times daily.    Dispense:  360 tablet    Refill:  3  . DISCONTD: glimepiride (AMARYL) 4 MG tablet    Sig: Take 2 tablets (8 mg total) by mouth 2 (two) times daily.    Dispense:  360 tablet    Refill:  1  . glimepiride (AMARYL) 4 MG tablet    Sig: Take 1 tablet (4 mg total) by mouth 2 (two) times daily.    Dispense:  180 tablet    Refill:  3  . nystatin cream (MYCOSTATIN)    Sig: Apply 1 application topically 2 (two) times daily.    Dispense:  30 g    Refill:  0    Return in about 4 weeks (around 01/30/2017) for recheck obesity.   [name], M.D. Primary Care at [facility] previously [facility] [address] [place], [place]  [address] [phone] phone [phone] fax

## 2017-01-02 NOTE — Progress Notes (Signed)
Patient ID: [name], female    DOB: [birth year], 41 y.o.   MRN: [id number]  PCP: Reginia Forts, MD  Chief Complaint  Patient presents with  . Follow-up    diabetes/medication    Subjective:   Presents for diabetes follow-up and medication refills.  She states her Ruben Gottron is too expensive so she hasn't gotten that filled. She also states she actually wasn't taking her medications for her diabetes so when her A1C came back elevated and additional medications were started she chose not to start the medications because she felt "she just needed to start taking the old ones". Currently she is taking her Metformin 1000mg  BID, the Glimepiride 8mg  BID, and Saxagliptin every other day. She also states her sugars at home are running constantly below 120.  She also had a question in regards to a surgery referal which they had said they contacted her PCP (DR.  Reginia Forts)  Review of Systems  All other systems reviewed and are negative.   Patient Active Problem List   Diagnosis Date Noted  . Hypothyroidism 09/11/2016  . Essential hypertension, benign 06/29/2015  . Allergic rhinitis due to pollen 06/29/2015  . Anxiety and depression 06/29/2015  . Gastroesophageal reflux disease without esophagitis 06/29/2015  . OSA on CPAP 06/29/2015  . History of syphilis 05/23/2015  . Amenorrhea 05/23/2015  . Severe obesity (BMI >= 40) ([place]) 09/14/2014  . Irregular menstrual cycle 11/12/2013  . Type II diabetes mellitus, uncontrolled ([place]) 06/20/2009  . POLYCYSTIC OVARIAN DISEASE 06/20/2009  . CARPAL TUNNEL SYNDROME 06/20/2009     Prior to Admission medications   Medication Sig Start Date End Date Taking? Authorizing Provider  FLUoxetine (PROZAC) 20 MG tablet Take 2 tablets (40 mg total) by mouth daily. 11/01/15  Yes Bennett Scrape V, PA-C  glimepiride (AMARYL) 4 MG tablet TAKE TWO TABLETS BY MOUTH TWICE DAILY 10/29/16  Yes Wardell Honour, MD  hydrochlorothiazide (HYDRODIURIL) 12.5 MG tablet Take  1 tablet (12.5 mg total) by mouth daily. 07/24/16  Yes Wardell Honour, MD  hydrochlorothiazide (HYDRODIURIL) 25 MG tablet TAKE ONE TABLET BY MOUTH ONCE DAILY 12/27/16  Yes Chelle Jeffery, PA-C  levothyroxine (SYNTHROID, LEVOTHROID) 100 MCG tablet Take 1 tablet (100 mcg total) by mouth daily. 09/12/16  Yes Renato Shin, MD  lisinopril (PRINIVIL,ZESTRIL) 10 MG tablet Take 1 tablet (10 mg total) by mouth daily. 07/24/16  Yes Wardell Honour, MD  loratadine (CLARITIN) 10 MG tablet Take 1 tablet (10 mg total) by mouth daily. 07/24/16  Yes Wardell Honour, MD  medroxyPROGESTERone (PROVERA) 10 MG tablet Take one trablet daily for 10 days of the month if no spontaneous menses every 30 days IF negative home UPT 05/23/15  Yes Terrance Mass, MD  metFORMIN (GLUCOPHAGE) 500 MG tablet Take 2 tabs by mouth (1000 mg ) 2 times daily. 07/24/16  Yes Wardell Honour, MD  methocarbamol (ROBAXIN) 750 MG tablet Take 1 tablet (750 mg total) by mouth every 6 (six) hours as needed for muscle spasms. 06/19/16  Yes Wendie Agreste, MD  nystatin (MYCOSTATIN/NYSTOP) 100000 UNIT/GM POWD Apply 500,000 g topically 2 (two) times daily. 04/16/16  Yes Shawnee Knapp, MD  nystatin cream (MYCOSTATIN) Apply 1 application topically 2 (two) times daily. 12/22/15  Yes Wardell Honour, MD  nystatin-triamcinolone ointment [facility]) Apply 1 application topically 2 (two) times daily. 04/16/16  Yes Shawnee Knapp, MD  saxagliptin HCl (ONGLYZA) 5 MG TABS tablet Take 1 tablet (5 mg total) by mouth  daily. 07/24/16  Yes Wardell Honour, MD  canagliflozin (INVOKANA) 100 MG TABS tablet Take 1 tablet (100 mg total) by mouth daily. Patient not taking: Reported on 01/02/2017 07/24/16   Wardell Honour, MD  empagliflozin (JARDIANCE) 25 MG TABS tablet Take 25 mg by mouth daily. Patient not taking: Reported on 01/02/2017 12/28/16   Wardell Honour, MD     Allergies  Allergen Reactions  . 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 Shrimp [Shellfish Allergy] Anaphylaxis    Pt states she  no longer has a reaction to shrimp       Objective:  Physical Exam  Constitutional: She is oriented to person, place, and time. She appears well-developed and well-nourished.  Eyes: Pupils are equal, round, and reactive to light.  Cardiovascular: Normal rate, regular rhythm and normal heart sounds.   Pulmonary/Chest: Effort normal and breath sounds normal.  Neurological: She is alert and oriented to person, place, and time.  Psychiatric: She has a normal mood and affect. Her behavior is normal. Judgment and thought content normal.        Assessment & Plan:  1. Type 2 diabetes mellitus without complication, without long-term current use of insulin (HCC) - metFORMIN (GLUCOPHAGE) 500 MG tablet; Take 2 tabs by mouth (1000 mg ) 2 times daily.  Dispense: 360 tablet; Refill: 3 - glimepiride (AMARYL) 4 MG tablet; Take 2 tablets (8 mg total) by mouth 2 (two) times daily.  Dispense: 360 tablet; Refill: 0 - saxagliptin HCl (ONGLYZA) 5 MG TABS tablet; Take 1 tablet (5 mg total) by mouth daily.  Dispense: 90 tablet; Refill: 3 - Microalbumin/Creatinine Ratio, Urine - Hemoglobin A1c  2. Severe obesity (BMI >= 40) (HCC) Gastric bypass appointment scheduled / in the process of conducting

## 2017-01-03 ENCOUNTER — Other Ambulatory Visit: Payer: Self-pay

## 2017-01-03 LAB — COMPREHENSIVE METABOLIC PANEL
ALBUMIN: 4.1 g/dL (ref 3.5–5.5)
ALK PHOS: 83 IU/L (ref 39–117)
ALT: 42 IU/L — ABNORMAL HIGH (ref 0–32)
AST: 27 IU/L (ref 0–40)
Albumin/Globulin Ratio: 1.4 (ref 1.2–2.2)
BUN / CREAT RATIO: 21 (ref 9–23)
BUN: 13 mg/dL (ref 6–24)
Bilirubin Total: <0.2 mg/dL (ref 0.0–1.2)
CALCIUM: 9.5 mg/dL (ref 8.7–10.2)
CO2: 25 mmol/L (ref 18–29)
CREATININE: 0.63 mg/dL (ref 0.57–1.00)
Chloride: 96 mmol/L (ref 96–106)
GFR, EST AFRICAN AMERICAN: 130 mL/min/{1.73_m2} (ref >59)
GFR, EST NON AFRICAN AMERICAN: 113 mL/min/{1.73_m2} (ref >59)
GLOBULIN, TOTAL: 2.9 g/dL (ref 1.5–4.5)
GLUCOSE: 146 mg/dL — AB (ref 65–99)
Potassium: 3.9 mmol/L (ref 3.5–5.2)
SODIUM: 139 mmol/L (ref 134–144)
TOTAL PROTEIN: 7 g/dL (ref 6.0–8.5)

## 2017-01-03 LAB — HEMOGLOBIN A1C
Est. average glucose Bld gHb Est-mCnc: 186 mg/dL
Hgb A1c MFr Bld: 8.1 % — ABNORMAL HIGH (ref 4.8–5.6)

## 2017-01-03 LAB — T4, FREE: Free T4: 1.03 ng/dL (ref 0.82–1.77)

## 2017-01-03 LAB — TSH: TSH: 4.64 u[IU]/mL — ABNORMAL HIGH (ref 0.450–4.500)

## 2017-01-03 NOTE — Telephone Encounter (Signed)
GLIMEPIRIDE 4MG  IS ON BACK ORDER CAN WE CHANGE TO 2MG  2 BID?  VERBAL OK GIVEN TO SUB

## 2017-01-14 ENCOUNTER — Telehealth: Payer: Self-pay | Admitting: Family Medicine

## 2017-01-14 NOTE — Telephone Encounter (Signed)
Pt to undergo bariatric weight loss surgery. [facility] faxed over a medically supervised weight loss documentation form to be filled out and faxed back. Needs completed asap as her new ins next month will not cover this procedure.   Placed in Dr Thompson Caul box with coordinating OV notes to be signed.

## 2017-01-15 ENCOUNTER — Telehealth: Payer: Self-pay | Admitting: Family Medicine

## 2017-01-15 NOTE — Telephone Encounter (Signed)
PATIENT STATES SOMEONE TRIED TO CALL HER TODAY REGARDING HER LAB RESULTS. SHE IS VERY PERSISTENT THAT SHE WANTS A CALL BACK FROM DR. SMITH. BEST PHONE [phone] (CELL) [place]

## 2017-01-16 NOTE — Telephone Encounter (Signed)
See abn results

## 2017-01-21 NOTE — Telephone Encounter (Signed)
Paperwork completed; will drop off at Referrals desk this afternoon for processing.

## 2017-01-29 DIAGNOSIS — F509 Eating disorder, unspecified: Secondary | ICD-10-CM | POA: Diagnosis not present

## 2017-01-30 ENCOUNTER — Encounter: Payer: Self-pay | Admitting: Family Medicine

## 2017-01-30 ENCOUNTER — Ambulatory Visit (INDEPENDENT_AMBULATORY_CARE_PROVIDER_SITE_OTHER): Payer: BLUE CROSS/BLUE SHIELD | Admitting: Family Medicine

## 2017-01-30 VITALS — BP 125/80 | HR 82 | Temp 98.3°F | Resp 16 | Ht 62.5 in | Wt 282.4 lb

## 2017-01-30 DIAGNOSIS — E119 Type 2 diabetes mellitus without complications: Secondary | ICD-10-CM | POA: Diagnosis not present

## 2017-01-30 DIAGNOSIS — J301 Allergic rhinitis due to pollen: Secondary | ICD-10-CM | POA: Diagnosis not present

## 2017-01-30 DIAGNOSIS — H1013 Acute atopic conjunctivitis, bilateral: Secondary | ICD-10-CM | POA: Diagnosis not present

## 2017-01-30 DIAGNOSIS — K219 Gastro-esophageal reflux disease without esophagitis: Secondary | ICD-10-CM | POA: Diagnosis not present

## 2017-01-30 DIAGNOSIS — Z9989 Dependence on other enabling machines and devices: Secondary | ICD-10-CM | POA: Diagnosis not present

## 2017-01-30 DIAGNOSIS — E282 Polycystic ovarian syndrome: Secondary | ICD-10-CM | POA: Diagnosis not present

## 2017-01-30 DIAGNOSIS — G4733 Obstructive sleep apnea (adult) (pediatric): Secondary | ICD-10-CM | POA: Diagnosis not present

## 2017-01-30 DIAGNOSIS — I10 Essential (primary) hypertension: Secondary | ICD-10-CM | POA: Diagnosis not present

## 2017-01-30 MED ORDER — OLOPATADINE HCL 0.2 % OP SOLN: 1.0000 [drp] | Freq: Every day | OPHTHALMIC | 2 refills | Status: DC

## 2017-01-30 NOTE — Progress Notes (Signed)
Subjective:    Patient ID: [name], female    DOB: Jul 18, 1976, 41 y.o.   MRN: [id number]  01/30/2017  Follow-up (DIABETES and OBESITY); Eye Problem ( BOTH mucus since last Friday morning); and Medication Refill (PATIENT WILL DISCUSS )   HPI This 41 y.o. female presents for one month follow-up of DMII, OBESITY, EYE PROBLEMS. Not taking Jardiance or Onglyza every day. Would like to wean off of medications as loses weight.  Sugars are controlled.  Checking sugars once daily; sugars run 100-140 fasting.  Not checking BP at home.    Having burning and itching of B eyes.  No fever/chills/sweats.  No eye redness; no mucous production; does have crusting in the mornings.  Tracks steps on phone. 12, 354 steps today; 5.5 miles today.  Work is very physically demanding. Monitoring food intake. Eating three meals per day. Snacks 2-3 snacks. Drinks 4 bottles of water per day at work. Drinks one soda per week on average.   Wt Readings from Last 3 Encounters:  02/25/17 289 lb 4.8 oz (131.2 kg)  01/30/17 282 lb 6.4 oz (128.1 kg)  01/02/17 285 lb (129.3 kg)   BP Readings from Last 3 Encounters:  01/30/17 125/80  01/02/17 130/81  10/04/16 114/74    Immunization History  Administered Date(s) Administered  . Influenza,inj,Quad PF,36+ Mos 06/29/2015, 07/24/2016  . Pneumococcal Polysaccharide-23 06/29/2015  . Tdap 06/29/2015    Review of Systems  Constitutional: Negative for chills, diaphoresis, fatigue and fever.  Eyes: Negative for visual disturbance.  Respiratory: Negative for cough and shortness of breath.   Cardiovascular: Negative for chest pain, palpitations and leg swelling.  Gastrointestinal: Negative for abdominal pain, constipation, diarrhea, nausea and vomiting.  Endocrine: Negative for cold intolerance, heat intolerance, polydipsia, polyphagia and polyuria.  Neurological: Negative for dizziness, tremors, seizures, syncope, facial asymmetry, speech difficulty, weakness,  light-headedness, numbness and headaches.    Past Medical History:  Diagnosis Date  . Carpal tunnel syndrome, bilateral   . Depression   . GERD (gastroesophageal reflux disease)   . H. pylori infection    dx  09-02-2014-- treated w/ antibiotic  . Hypertension   . Irregular menstrual cycle   . Menorrhagia   . PCOS (polycystic ovarian syndrome)   . Type 2 diabetes mellitus ([place])    Past Surgical History:  Procedure Laterality Date  . CARPAL TUNNEL RELEASE Bilateral 10/07/2014   Procedure: CARPAL TUNNEL RELEASE BILATERAL;  Surgeon: [name], MD;  Location: [facility];  Service: Orthopedics;  Laterality: Bilateral;  . CESAREAN SECTION  1995   Allergies  Allergen Reactions  . 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 Shrimp [Shellfish Allergy] Anaphylaxis    Pt states she no longer has a reaction to shrimp    Social History   Social History  . Marital status: Married    Spouse name: N/A  . Number of children: 2  . Years of education: N/A   Occupational History  . Not on file.   Social History Main Topics  . Smoking status: Never Smoker  . Smokeless tobacco: Never Used  . Alcohol use 0.0 oz/week     Comment: socially  . Drug use: No  . Sexual activity: Yes    Partners: Male    Birth control/ protection: None   Other Topics Concern  . Not on file   Social History Narrative   Marital status: married x 1 year; from [place].  Moved to [place] since 2000.  Children:  2 children (22, 22); one grandchildren (5)      Lives:  With husband      Employment:  [employer] full time;       Tobacco: none      Alcohol: social      Drugs: none      Exercise: none   No reported caffeine use    Family History  Problem Relation Age of Onset  . Heart disease Mother 82    CAD  . Diabetes Mother   . Hypertension Mother   . Cancer Father 66    prostate cancer  . Diabetes Father   . Thyroid disease Neg Hx        Objective:    BP 125/80 (BP Location: Right  Arm, Patient Position: Sitting, Cuff Size: Large)   Pulse 82   Temp 98.3 F (36.8 C) (Oral)   Resp 16   Ht 5' 2.5" (1.588 m)   Wt 282 lb 6.4 oz (128.1 kg)   LMP 01/02/2017   SpO2 99%   BMI 50.83 kg/m  Physical Exam  Constitutional: She is oriented to person, place, and time. She appears well-developed and well-nourished. No distress.  HENT:  Head: Normocephalic and atraumatic.  Right Ear: External ear normal.  Left Ear: External ear normal.  Nose: Nose normal.  Mouth/Throat: Oropharynx is clear and moist.  Eyes: Conjunctivae and EOM are normal. Pupils are equal, round, and reactive to light.  Neck: Normal range of motion. Neck supple. Carotid bruit is not present. No thyromegaly present.  Cardiovascular: Normal rate, regular rhythm, normal heart sounds and intact distal pulses.  Exam reveals no gallop and no friction rub.   No murmur heard. Pulmonary/Chest: Effort normal and breath sounds normal. She has no wheezes. She has no rales.  Abdominal: Soft. Bowel sounds are normal. She exhibits no distension and no mass. There is no tenderness. There is no rebound and no guarding.  Lymphadenopathy:    She has no cervical adenopathy.  Neurological: She is alert and oriented to person, place, and time. No cranial nerve deficit.  Skin: Skin is warm and dry. No rash noted. She is not diaphoretic. No erythema. No pallor.  Psychiatric: She has a normal mood and affect. Her behavior is normal.        Assessment & Plan:   1. Type 2 diabetes mellitus without complication, without long-term current use of insulin ([place])   2. Allergic conjunctivitis of both eyes   3. Essential hypertension, benign   4. Seasonal allergic rhinitis due to pollen   5. OSA on CPAP   6. Gastroesophageal reflux disease without esophagitis   7. POLYCYSTIC OVARIAN DISEASE   8. Severe obesity (BMI >= 40) (HCC)    -moderately controlled DMII; recommend weight loss, exercise, and low-carbohydrate food choices.   Agreeable to weaning Onglyza in future as loses further weight. -morbid obesity contributing to multiple comorbidities including HTN, DMII, PCOS, GERD, OSA.  Highly recommend limiting caloric intake to 1200-1500 kcal/day.  Recommend bariatric surgery for assistance.  Continue with daily exercise. Upcoming appointment with nutritionist to assist with weight loss and dietary modification for comorbidities. -rx for new onset allergic conjuntivitis; rx for Olopatadine solution to use daily.   Orders Placed This Encounter  Procedures  . HM Diabetes Foot Exam   Meds ordered this encounter  Medications  . Olopatadine HCl 0.2 % SOLN    Sig: Apply 1 drop to eye daily.    Dispense:  2.5 mL  Refill:  2    Return in about 4 weeks (around 02/27/2017) for recheck.  Artha Chiasson Elayne Guerin, M.D. Primary [facility] at [facility] [address] [place], [place]  [address] [phone] phone [phone] fax

## 2017-01-30 NOTE — Patient Instructions (Addendum)
IF you received an x-ray today, you will receive an invoice from [facility]. Please contact [facility] at [phone] with questions or concerns regarding your invoice.   IF you received labwork today, you will receive an invoice from [facility]. Please contact [facility] at [phone] with questions or concerns regarding your invoice.   Our billing staff will not be able to assist you with questions regarding bills from these companies.  You will be contacted with the lab results as soon as they are available. The fastest way to get your results is to activate your My Chart account. Instructions are located on the last page of this paperwork. If you have not heard from [place] regarding the results in 2 weeks, please contact this office.    We recommend that you schedule a mammogram for breast cancer screening. Typically, you do not need a referral to do this. Please contact a local imaging center to schedule your mammogram.  [facility] - [phone]  *ask for the Radiology Department [facility]) - [phone] or [phone]  [facility] - [phone] [place] [phone] [facility] - [phone]  *ask for the [facility] - [phone]  *ask for the Radiology Department [facility] - [phone]  *ask for the [place] - [phone] recommend that you schedule a mammogram for breast cancer screening. Typically, you do not need a referral to do this. Please contact a local imaging center to schedule your mammogram.  [facility] - [phone]  *ask for the Radiology Department [facility] ([place] - [phone] or [phone]  [facility] - [phone] [place] [phone] [facility] - [phone]  *ask for the [facility] - [phone]  *ask for the Radiology Department [facility] - [phone]  *ask for the Mammography Department [facility] - [phone]  Recuento de caloras para bajar de peso (Calorie Counting for [place] [employer] Life Loss) Las caloras son energa que se obtiene de lo que se come y se bebe. El organismo [place] esta energa para mantenerlo Curator. La cantidad de caloras que come tiene incidencia Lubrizol Corporation. Cuando come ms caloras de las que el cuerpo necesita, este acumula las caloras extra [place]. Cuando come Universal Health de las que el cuerpo [place], este quema grasa para obtener la energa que requiere. El recuento de caloras es el registro de la cantidad de caloras que come y Pharmacologist. Si se asegura de comer menos caloras de las que el cuerpo necesita, debe bajar de [place]. Para que el recuento de caloras funcione, tendr que comer la cantidad de caloras adecuadas para usted en un da, para bajar una cantidad de peso saludable por semana. Una cantidad de peso saludable para bajar por semana suele ser [facility] y Ivar Drape (0,5 a 0,9kg). Un nutricionista puede determinar la cantidad de caloras que necesita por da y sugerirle cmo alcanzar su objetivo calrico. CUL ES MI PLAN? Mi objetivo es comer __________ Raenette Rover da. Si como esta cantidad de caloras por da, debo bajar unas __________ Terrall Laity. QU DEBO SABER ACERCA DEL [place]? A fin de alcanzar su objetivo diario de caloras, tendr que:  Averiguar cuntas caloras hay en cada alimento que le Therapist, occupational. Intente hacerlo antes de comer.  Decida la cantidad que puede comer del alimento.  Anote lo que comi y cuntas caloras tena. Esta tarea se conoce como llevar un registro de comidas. DNDE ENCUENTRO INFORMACIN SOBRE LAS CALORAS? Es posible Animator cantidad de caloras que contiene un alimento en la etiqueta  de informacin nutricional. Tenga en cuenta que toda la informacin que se incluye en una etiqueta se basa en una porcin especfica del alimento. Si un alimento no tiene una etiqueta de informacin nutricional, intente buscar las caloras en Internet o pida ayuda al nutricionista. CMO DECIDO CUNTO COMER? Para decidir qu cantidad del alimento puede comer, tendr que tener en cuenta el nmero de caloras en una porcin y el tamao de una porcin. Es posible Financial trader en la etiqueta de informacin nutricional. Si un alimento no tiene una etiqueta de informacin nutricional, busque los [place] en Internet o pida ayuda al nutricionista. Recuerde que las caloras se calculan por porcin. Si opta por comer ms de una porcin de un alimento, tendr Tenneco Inc las caloras por porcin por la cantidad de porciones que planea comer. Por ejemplo, la etiqueta de un envase de pan puede decir que el tamao de una porcin es [place], y que una porcin tiene 90caloras. Si come 1rodaja, habr comido 90caloras. Si come 2rodajas, habr comido 180caloras. [place]? Despus de cada comida, registre la siguiente informacin en el registro de comidas:  Lo que comi.  La cantidad que comi.  La cantidad de caloras que tena.  Luego, sume las caloras. Tenga a Materials engineer de comidas, por ejemplo, en un anotador de bolsillo. Otra alternativa es usar una aplicacin en el telfono mvil o un sitio web. Algunos programas calcularn las caloras y [employer] Stores la cantidad de caloras que le [place], [place] vez que agregue un alimento al Control and instrumentation engineer. CULES SON ALGUNOS CONSEJOS PARA EL [place]?  Use las caloras de los alimentos y las bebidas que lo sacien y no lo dejen con apetito, por ejemplo, frutos secos y Engineer, mining de frutos secos, verduras, protenas magras y alimentos con alto contenido de fibra (ms de 5g de fibra por porcin).  Coma alimentos  nutritivos y evite las caloras vacas. Las caloras vacas son aquellas que se obtienen de los alimentos o las bebidas que no contienen muchos nutrientes, como los dulces y los refrescos. Es mejor comer una comida nutritiva altamente calrica (como un aguacate) que una con pocos nutrientes (como una bolsa de patatas fritas).  Sepa cuntas caloras tienen los alimentos que come con ms frecuencia. eBay, no tiene que buscar este dato cada vez que los come.  Est atento a los Chiropodist hipocalricos, pero que en realidad contienen muchas caloras, como los productos de [place], los refrescos y los dulces sin Lobbyist.  Preste atencin a las Automatic Data, Franklin Resources refrescos, las bebidas a base de [place], las bebidas con alcohol y los jugos, que contienen muchas caloras, pero no le dan saciedad. Opte por las bebidas bajas en caloras, como el agua y las bebidas dietticas.  Concntrese en tratar de contar las caloras de los alimentos que tienen la mayor cantidad de caloras. Registrar las caloras de una ensalada que solo contiene hortalizas es menos importante que calcular las de un batido de [place].  Encuentre un mtodo para controlar las caloras que funcione para usted. Sea creativo. La State Farm de las personas que alcanzan el xito encuentran mtodos para llevar un registro de cunto  comen en un da, incluso si no cuentan cada calora. CULES SON ALGUNOS CONSEJOS PARA CONTROLAR LAS PORCIONES?  Sepa cuntas caloras hay en una porcin. Esto lo ayudar a saber cuntas porciones de un alimento determinado puede comer.  Use una taza medidora para medir los Northrop Grumman, lo que es muy til al principio. Con el tiempo, podr hacer un clculo estimativo de los tamaos de las porciones de algunos alimentos.  Dedique tiempo a poner porciones de diferentes alimentos en sus platos, tazones y tazas predilectos, a fin de saber cmo se ve una porcin.  Intente no  comer directamente de [place] bolsa o una caja, ya que esto puede llevarlo a comer en exceso. Ponga la cantidad Land O'Lakes gustara comer en una taza o un plato, a fin de asegurarse de que est comiendo la porcin correcta.  Use platos, vasos y tazones ms pequeos para no comer en exceso. Esta es una forma rpida y sencilla de poner en prctica el control de las porciones. Si el plato es ms pequeo, le caben menos alimentos.  Intente no hacer muchas tareas mientras come, como ver televisin o usar la computadora. Si es la hora de comer, sintese a Conservation officer, nature y disfrute de Environmental education officer. Esto lo ayudar a que empiece a Marine scientist cundo est satisfecho. Tambin le permitir estar ms consciente de lo que come y cunto est comiendo. [place]?  Pida porciones ms pequeas o porciones para nios.  Considere la posibilidad de Publishing rights manager un plato principal y las guarniciones, en lugar de pedir su propio plato principal.  Si pide su propio plato principal, coma solo la mitad. Pida una caja al comienzo de la comida y ponga all el resto del plato principal, para no sentir la tentacin de comerlo.  Busque las caloras en el men. Si se detallan las caloras, elija las opciones que contengan la menor cantidad.  Elija platos que incluyan verduras, frutas, cereales integrales, productos lcteos con bajo contenido de grasa y Advertising account planner. Centrarse en elegir con inteligencia alimentos de cada uno de los 5grupos que puede ayudarlo a seguir por el buen camino en los restaurantes.  Opte por los alimentos hervidos, asados, cocidos a la parrilla o al vapor.  Elija el agua, la [place] t helado sin azcar u otras bebidas que no contengan azcares agregados. Si desea una bebida alcohlica, escoja una opcin con menos caloras. Por ejemplo, un margarita normal puede The Sherwin-Williams, y un vaso de vino tiene unas 150.  No coma alimentos que contengan mantequilla,  estn empanados, fritos o que se sirvan con salsa a base de crema. Generalmente, los alimentos que se etiquetan como "crujientes" estn fritos, a menos que se indique lo contrario.  Ordene los Kimberly-Clark, las salsas y los jarabes aparte, ya que suelen tener muchas caloras; por lo tanto, no los consuma en grandes cantidades.  Tenga cuidado con las [place]. Muchas personas piensan que las ensaladas son Ardelia Mems opcin saludable, pero en muchas cosas, esto no es as. Hay muchas ensaladas que contienen tocino, pollo frito, grandes cantidades de [place], patatas fritas y Lexicographer. Todos estos productos son altamente calricos. Si desea Katherine Mantle, elija una de hortalizas y pida carnes a la parrilla o un filete. Ordene el aderezo aparte o pida aceite de [place] y vinagre o limn para Haematologist.  Haga un clculo estimativo de la cantidad de porciones que le sirven. Por ejemplo, una porcin de arroz cocido equivale a media  taza o tiene el tamao aproximado de un molde de [place], o de media pelota de tenis. Conocer el tamao de las porciones lo ayudar a Personnel officer atento a la cantidad de comida que come Occidental Petroleum. La lista que sigue le [place] el tamao de algunas porciones comunes a partir de objetos cotidianos.  1onza (28g) = 4dados apilados.  3onzas (85g) = 15mazo de cartas.  1cucharadita = 1dado.  1cucharada = media pelota de tenis de mesa.  2cucharadas = 1pelota de tenis de mesa.  Media taza = 1pelota de tenis o 57molde de magdalena.  1taza = 1 pelota de bisbol. Esta informacin no tiene Marine scientist el consejo del mdico. Asegrese de hacerle al mdico cualquier pregunta que tenga. Document Released: 01/31/2009 Document Revised: 11/05/2014 Document Reviewed: 08/20/2013 Elsevier Interactive Patient Education  2017 Reynolds [employer].

## 2017-02-02 ENCOUNTER — Other Ambulatory Visit: Payer: Self-pay | Admitting: Family Medicine

## 2017-02-02 DIAGNOSIS — E119 Type 2 diabetes mellitus without complications: Secondary | ICD-10-CM

## 2017-02-02 NOTE — Telephone Encounter (Signed)
Should she be checking her sugars?  please complete rx if appropriate

## 2017-02-02 NOTE — Telephone Encounter (Signed)
Pt called stated she needs a prescription for strips to check her sugar.. I didn't see one in her meds..  Please advise: [phone]

## 2017-02-03 MED ORDER — GLUCOSE BLOOD VI STRP
ORAL_STRIP | 4 refills | Status: DC
Start: 2017-02-03 — End: 2019-03-31

## 2017-02-03 MED ORDER — EMPAGLIFLOZIN 25 MG PO TABS: 25.0000 mg | ORAL_TABLET | Freq: Every day | ORAL | 3 refills | Status: DC

## 2017-02-03 MED ORDER — CANAGLIFLOZIN 100 MG PO TABS: 100.0000 mg | ORAL_TABLET | Freq: Every day | ORAL | 3 refills | Status: DC

## 2017-02-03 NOTE — Telephone Encounter (Signed)
Spoke to patient at visit 01/30/17.

## 2017-02-09 DIAGNOSIS — F509 Eating disorder, unspecified: Secondary | ICD-10-CM | POA: Diagnosis not present

## 2017-02-18 ENCOUNTER — Ambulatory Visit: Payer: BLUE CROSS/BLUE SHIELD

## 2017-02-25 ENCOUNTER — Encounter: Payer: Self-pay | Admitting: Skilled Nursing Facility1

## 2017-02-25 ENCOUNTER — Telehealth: Payer: Self-pay | Admitting: Endocrinology

## 2017-02-25 ENCOUNTER — Encounter: Payer: BLUE CROSS/BLUE SHIELD | Attending: General Surgery | Admitting: Skilled Nursing Facility1

## 2017-02-25 DIAGNOSIS — IMO0001 Reserved for inherently not codable concepts without codable children: Secondary | ICD-10-CM

## 2017-02-25 DIAGNOSIS — E1165 Type 2 diabetes mellitus with hyperglycemia: Secondary | ICD-10-CM

## 2017-02-25 DIAGNOSIS — Z713 Dietary counseling and surveillance: Secondary | ICD-10-CM | POA: Insufficient documentation

## 2017-02-25 NOTE — Progress Notes (Signed)
  Pre-Operative Nutrition Class:  Appt start time: 4718   End time:  1830.  Patient was seen on 02/25/2017 for Pre-Operative [facility]ric [facility]   Surgery date: 03/12/2017 Surgery type: RYGB Start weight at [facility]: 282 Weight today: 289.3  TANITA  BODY COMP RESULTS  N/A   BMI (kg/m^2)    Fat Mass (lbs)    Fat Free Mass (lbs)    Total Body Water (lbs)    Samples given per MNT protocol. Patient educated on appropriate usage: Opurity Multivitamin Lot # [id number] Exp: 04/19  Bariatric Advantage Calcium Citrate Lot # [id number] Exp:  [place]ier Protein  Lot #[id number] Exp: 21/dec/2018  The following the learning objectives were met by the patient during this course:  Identify Pre-Op Dietary Goals and will begin 2 weeks pre-operatively  Identify appropriate sources of fluids and proteins   State protein recommendations and appropriate sources pre and post-operatively  Identify Post-Operative Dietary Goals and will follow for 2 weeks post-operatively  Identify appropriate multivitamin and calcium sources  Describe the need for physical activity post-operatively and will follow MD recommendations  State when to call healthcare provider regarding medication questions or post-operative complications  Handouts given during class include:  Pre-Op Bariatric Surgery Diet Handout  Protein Shake Handout  Post-Op Bariatric Surgery Nutrition Handout  BELT Program Information Flyer  Support Group Information Flyer  [facility] Bariatric Supplements Price List  Follow-Up Plan: [facility] advancement per MD.

## 2017-02-25 NOTE — Telephone Encounter (Signed)
Pt came by and wanted to inform us that she is having the Gastric Bypass surgery on 03/12/17, she wanted to know if she needs to change anything with any of her medications before her surgery or after.  Please advise and call patient.

## 2017-02-25 NOTE — Telephone Encounter (Signed)
Spoke with the patient and she stated an understanding

## 2017-02-25 NOTE — Telephone Encounter (Signed)
No need to change before. When you go home, they will reduce your meds then. Please come back for a follow-up appointment 1-2 weeks after you leave the hospital.

## 2017-02-28 ENCOUNTER — Ambulatory Visit: Payer: Self-pay | Admitting: General Surgery

## 2017-02-28 DIAGNOSIS — E119 Type 2 diabetes mellitus without complications: Secondary | ICD-10-CM | POA: Diagnosis not present

## 2017-02-28 DIAGNOSIS — G4733 Obstructive sleep apnea (adult) (pediatric): Secondary | ICD-10-CM | POA: Diagnosis not present

## 2017-02-28 DIAGNOSIS — I10 Essential (primary) hypertension: Secondary | ICD-10-CM | POA: Diagnosis not present

## 2017-02-28 NOTE — H&P (Signed)
History of Present Illness Geoffery Spruce MD; 02/28/2017 12:13 PM) Patient words: pre-op.  The patient is a 41 year old female who presents for a bariatric surgery evaluation. Patient has completed all necessary workup in the preoperative phase for bariatric surgery. The patient is now ready to proceed with surgery. The patient has made moderate improvements in her diet and is exercising well and has no new medical issues or new medications.    Allergies Malachi Bonds, CMA; 02/28/2017 11:52 AM) No Known Drug Allergies 05/10/2016  Medication History (Chemira Jones, CMA; 02/28/2017 11:53 AM) HydroCHLOROthiazide (25MG  Tablet, Oral) Active. Onglyza (5MG  Tablet, Oral) Active. Diclofenac Sodium (75MG  Tablet DR, Oral) Active. Glimepiride (4MG  Tablet, Oral) Active. Methocarbamol (750MG  Tablet, Oral) Active. Nystatin (100000 UNIT/GM Powder, External) Active. ValACYclovir HCl (500MG  Tablet, Oral) Active. Fluticasone Propionate (50MCG/ACT Suspension, Nasal) Active. Invokana (100MG  Tablet, Oral) Active. Lisinopril (10MG  Tablet, Oral) Active. Cyclobenzaprine HCl (5MG  Tablet, Oral) Active. Clobetasol Propionate (0.05% Cream, External) Active. MetFORMIN HCl (500MG  Tablet, Oral) Active. Olopatadine HCl (0.2% Solution, Ophthalmic) Active. Medications Reconciled    Review of Systems Geoffery Spruce MD; 02/28/2017 12:13 PM) General Present- Weight Gain. Not Present- Appetite Loss, Chills, Fatigue, Fever, Night Sweats and Weight Loss. Skin Not Present- Change in Wart/Mole, Dryness, Hives, Jaundice, New Lesions, Non-Healing Wounds, Rash and Ulcer. HEENT Present- Wears glasses/contact lenses. Not Present- Earache, Hearing Loss, Hoarseness, Nose Bleed, Oral Ulcers, Ringing in the Ears, Seasonal Allergies, Sinus Pain, Sore Throat, Visual Disturbances and Yellow Eyes. Respiratory Present- Snoring. Not Present- Bloody sputum, Chronic Cough, Difficulty Breathing and Wheezing. Breast Not  Present- Breast Mass, Breast Pain, Nipple Discharge and Skin Changes. Cardiovascular Present- Leg Cramps. Not Present- Chest Pain, Difficulty Breathing Lying Down, Palpitations, Rapid Heart Rate, Shortness of Breath and Swelling of Extremities. Gastrointestinal Not Present- Abdominal Pain, Bloating, Bloody Stool, Change in Bowel Habits, Chronic diarrhea, Constipation, Difficulty Swallowing, Excessive gas, Gets full quickly at meals, Hemorrhoids, Indigestion, Nausea, Rectal Pain and Vomiting. Female Genitourinary Present- Frequency. Not Present- Nocturia, Painful Urination, Pelvic Pain and Urgency. Musculoskeletal Present- Back Pain and Muscle Pain. Not Present- Joint Pain, Joint Stiffness, Muscle Weakness and Swelling of Extremities. Endocrine Present- New Diabetes. Not Present- Cold Intolerance, Excessive Hunger, Hair Changes, Heat Intolerance and Hot flashes.  Vitals (Chemira Jones CMA; 02/28/2017 11:51 AM) 02/28/2017 11:51 AM Weight: 277.6 lb Height: 62in Body Surface Area: 2.2 m Body Mass Index: 50.77 kg/m  Temp.: 98.81F(Oral)  BP: 130/80 (Sitting, Left Arm, Standard)       Physical Exam Geoffery Spruce MD; 02/28/2017 12:13 PM) General Mental Status-Alert. General Appearance-Cooperative. Orientation-Oriented X4. Build & Nutrition-Obese. Posture-Normal posture.  Integumentary Global Assessment Upon inspection and palpation of skin surfaces of the - Head/Face: no rashes, ulcers, lesions or evidence of photo damage. No palpable nodules or masses and Neck: no visible lesions or palpable masses.  Head and Neck Head-normocephalic, atraumatic with no lesions or palpable masses. Face Global Assessment - atraumatic. Thyroid Gland Characteristics - normal size and consistency.  Eye Eyeball - Bilateral-Extraocular movements intact. Sclera/Conjunctiva - Bilateral-No scleral icterus, No Discharge.  ENMT Nose and Sinuses External Inspection of the Nose - no  deformities observed, no swelling present.  Chest and Lung Exam Palpation Palpation of the chest reveals - Non-tender. Auscultation Breath sounds - Normal.  Cardiovascular Auscultation Rhythm - Regular. Heart Sounds - S1 WNL and S2 WNL. Carotid arteries - No Carotid bruit.  Abdomen Inspection Inspection of the abdomen reveals - No Visible peristalsis, No Abnormal pulsations and No Paradoxical movements. Palpation/Percussion Palpation and Percussion  of the abdomen reveal - Soft, Non Tender, No Rebound tenderness, No Rigidity (guarding), No hepatosplenomegaly and No Palpable abdominal masses. Note: large abdominal obesity, no skin break down in folds, no umbilical hernia   Peripheral Vascular Upper Extremity Palpation - Pulses bilaterally normal. Lower Extremity Palpation - Edema - Bilateral - No edema.  Neurologic Neurologic evaluation reveals -normal sensation and normal coordination.  Neuropsychiatric Mental status exam performed with findings of-able to articulate well with normal speech/language, rate, volume and coherence and thought content normal with ability to perform basic computations and apply abstract reasoning.  Musculoskeletal Normal Exam - Bilateral-Upper Extremity Strength Normal and Lower Extremity Strength Normal.    Assessment & Plan Geoffery Spruce MD; 02/28/2017 12:14 PM) MORBID OBESITY (E66.01) Story: 41 yo female with obesity class III. She has dm, OSA, HTN. She has completed all requirements and is ready to proceed with RNY gastric bypass Impression: The patient meets weight loss surgery criteria. Due to the above reasons, I think laparoscopic RNY gastric bypass is the best option for the patient.  We discussed RNY gastric bypass. We discussed the preoperative, operative and postoperative process. I explained the surgery in detail including the performance of an EGD near the end of the surgery to test for leak. We discussed the typical  hospital course including a 2-3 day stay baring any complications. The patient was given educational material. I quoted the patient that most patients can lose up to 50-70% of their excess weight. We did discuss the possibility of weight regain several years after the procedure.  The risks of infection, bleeding, pain, scarring, weight regain, too little or too much weight loss, vitamin deficiencies and need for lifelong vitamin supplementation, hair loss, need for protein supplementation, leaks, stricture, reflux, food intolerance, gallstone formation, hernia, need for reoperation, need for open surgery, injury to spleen or surrounding structures, DVT's, PE, and death again discussed with the patient and the patient expressed understanding and desires to proceed with laparoscopic Roux en Y gastric bypass, possible open, intraoperative endoscopy.  We discussed that before and after surgery that there would be an alteration in their diet. I explained that we have put them on a diet 2 weeks before surgery. I also explained that they would be on a liquid diet for 2 weeks after surgery. We discussed that they would have to avoid certain foods after surgery. We discussed the importance of physical activity as well as compliance with our dietary and supplement recommendations and routine follow-up. ESSENTIAL HYPERTENSION (I10) Impression: On one medication with good blood pressure here today. DIABETES MELLITUS (E11.9) Impression: On 3 oral medications. OBSTRUCTIVE SLEEP APNEA (G47.33) Impression: does not wear mask on regularity due to social stigma

## 2017-03-05 ENCOUNTER — Telehealth: Payer: Self-pay | Admitting: Family Medicine

## 2017-03-05 NOTE — Telephone Encounter (Signed)
fyi

## 2017-03-05 NOTE — Telephone Encounter (Signed)
Sherry from [facility] called to let Dr Tamala Julian know that she will be working with this patient after her bypass which is on 03/12/17.  If you have any questions please call [phone]

## 2017-03-06 ENCOUNTER — Encounter: Payer: Self-pay | Admitting: Family Medicine

## 2017-03-06 ENCOUNTER — Ambulatory Visit (INDEPENDENT_AMBULATORY_CARE_PROVIDER_SITE_OTHER): Payer: BLUE CROSS/BLUE SHIELD | Admitting: Family Medicine

## 2017-03-06 VITALS — BP 101/56 | HR 71 | Temp 98.2°F | Resp 18 | Ht 62.0 in | Wt 276.0 lb

## 2017-03-06 DIAGNOSIS — I10 Essential (primary) hypertension: Secondary | ICD-10-CM

## 2017-03-06 DIAGNOSIS — E119 Type 2 diabetes mellitus without complications: Secondary | ICD-10-CM | POA: Diagnosis not present

## 2017-03-06 DIAGNOSIS — K219 Gastro-esophageal reflux disease without esophagitis: Secondary | ICD-10-CM

## 2017-03-06 LAB — GLUCOSE, POCT (MANUAL RESULT ENTRY): POC GLUCOSE: 66 mg/dL — AB (ref 70–99)

## 2017-03-06 NOTE — Patient Instructions (Addendum)
   Stop LISINOPRIL.   Stop GLIMIPERIDE.  CHECK SUGAR EVERY MORNING BEFORE BREAKFAST. CHECK SUGAR EVERY NIGHT AT BEDTIME.   IF you received an x-ray today, you will receive an invoice from [facility]. Please contact [facility] at [phone] with questions or concerns regarding your invoice.   IF you received labwork today, you will receive an invoice from [facility]. Please contact [facility] at [phone] with questions or concerns regarding your invoice.   Our billing staff will not be able to assist you with questions regarding bills from these companies.  You will be contacted with the lab results as soon as they are available. The fastest way to get your results is to activate your My Chart account. Instructions are located on the last page of this paperwork. If you have not heard from [place] regarding the results in 2 weeks, please contact this office.

## 2017-03-06 NOTE — Progress Notes (Signed)
Subjective:    Patient ID: [name], female    DOB: 07-Jul-1976, 41 y.o.   MRN: [id number]  03/06/2017  Follow-up (patient states she is here for 1 month f/u for check up)   HPI This 41 y.o. female presents for one month follow-up of obesity, DMII, hypertension.  Scheduled for gastric bypass on 03/12/17.  Will be out of work for eight weeks.  After surgery, will need to see PCP.   Will be rapid weight loss.  Went to nutritionist last week.  Last 13 pounds in one week.  Currently eating 2 shakes per day.  After everyone left, discussed with nutritionist that Spanish food is different.   B: shake at 4:50am. L: tuna 300 calorie can;  S: slice of cheese L: grilled chicken with lime and salsa or health choice food under 300 calories and more 14 proteins. A lot of sodium in it. S: cheese; cannot have fruit/corn/peas/breads before surgery Dinner: grilled fish 3 ounces, tuna or shake.   Lollipops fat free.    After surgery, can only eat cream of mushroom (without mushrooms).  Must buy food prior to surgery.   Yesterday reviewed medications.  Before bedtime, takes 2 Glimeperide and 2 Metformin.  Drinking lots of water.  Feeling tired and shay since on diet.  Wt Readings from Last 3 Encounters:  03/14/17 286 lb 13.1 oz (130.1 kg)  03/07/17 278 lb 3.2 oz (126.2 kg)  03/06/17 276 lb (125.2 kg)   BP Readings from Last 3 Encounters:  03/14/17 120/68  03/07/17 101/79  03/06/17 (!) 101/56   Immunization History  Administered Date(s) Administered  . Influenza,inj,Quad PF,36+ Mos 06/29/2015, 07/24/2016  . Pneumococcal Polysaccharide-23 06/29/2015  . Tdap 06/29/2015    Review of Systems  Constitutional: Positive for fatigue. Negative for chills, diaphoresis and fever.  Eyes: Negative for visual disturbance.  Respiratory: Negative for cough and shortness of breath.   Cardiovascular: Negative for chest pain, palpitations and leg swelling.  Gastrointestinal: Negative for abdominal pain,  constipation, diarrhea, nausea and vomiting.  Endocrine: Negative for cold intolerance, heat intolerance, polydipsia, polyphagia and polyuria.  Neurological: Positive for dizziness. Negative for tremors, seizures, syncope, facial asymmetry, speech difficulty, weakness, light-headedness, numbness and headaches.    Past Medical History:  Diagnosis Date  . Carpal tunnel syndrome, bilateral   . Depression   . GERD (gastroesophageal reflux disease)   . H. pylori infection    dx  09-02-2014-- treated w/ antibiotic  . Hypertension    recently low pressures   . Irregular menstrual cycle   . Menorrhagia   . PCOS (polycystic ovarian syndrome)   . Type 2 diabetes mellitus ([place])    Past Surgical History:  Procedure Laterality Date  . CARPAL TUNNEL RELEASE Bilateral 10/07/2014   Procedure: CARPAL TUNNEL RELEASE BILATERAL;  Surgeon: [name], MD;  [facility];  Service: Orthopedics;  Laterality: Bilateral;  . CESAREAN SECTION  1995  . GASTRIC ROUX-EN-Y N/A 03/12/2017   Procedure: LAPAROSCOPIC ROUX-EN-Y GASTRIC BYPASS WITH UPPER ENDOSCOPY;  Surgeon: Kieth Brightly, Arta Bruce, MD;  Location: [facility];  Service: General;  Laterality: N/A;  . UPPER GI ENDOSCOPY  03/12/2017   Procedure: UPPER GI ENDOSCOPY;  Surgeon: Kieth Brightly Arta Bruce, MD;  Location: [facility];  Service: General;;   Allergies  Allergen Reactions  . Food     RAW/FRESH FRUIT--PEACHES, PEARS, APPLES=THROAT CLOSES/SWELLING & COUGHING SHORTNESS OF BREATH  . 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 Shrimp [Shellfish Allergy] Anaphylaxis  Social History   Social History  . Marital status: Married    Spouse name: N/A  . Number of children: 2  . Years of education: N/A   Occupational History  . Not on file.   Social History Main Topics  . Smoking status: Never Smoker  . Smokeless tobacco: Never Used  . Alcohol use 0.0 oz/week     Comment: socially  . Drug use: No  . Sexual activity: Yes    Partners:  Male    Birth control/ protection: None   Other Topics Concern  . Not on file   Social History Narrative   Marital status: married x 1 year; from [place].  Moved to [place] since 2000 from [place]; born in [place].      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        Employment:  [employer] full time;       Tobacco: none      Alcohol: social      Drugs: none      Exercise: none   No reported caffeine use    Family History  Problem Relation Age of Onset  . Heart disease Mother 23       CAD  . Diabetes Mother   . Hypertension Mother   . Cancer Father 15       prostate cancer  . Diabetes Father   . Thyroid disease Neg Hx        Objective:    BP (!) 101/56 (BP Location: Left Arm, Patient Position: Sitting, Cuff Size: Small)   Pulse 71   Temp 98.2 F (36.8 C) (Oral)   Resp 18   Ht 5\' 2"  (1.575 m)   Wt 276 lb (125.2 kg)   LMP 02/14/2017 (Approximate)   SpO2 95%   BMI 50.48 kg/m  Physical Exam  Constitutional: She is oriented to person, place, and time. She appears well-developed and well-nourished. No distress.  HENT:  Head: Normocephalic and atraumatic.  Right Ear: External ear normal.  Left Ear: External ear normal.  Nose: Nose normal.  Mouth/Throat: Oropharynx is clear and moist.  Eyes: Conjunctivae and EOM are normal. Pupils are equal, round, and reactive to light.  Neck: Normal range of motion. Neck supple. Carotid bruit is not present. No thyromegaly present.  Cardiovascular: Normal rate, regular rhythm, normal heart sounds and intact distal pulses.  Exam reveals no gallop and no friction rub.   No murmur heard. Pulmonary/Chest: Effort normal and breath sounds normal. She has no wheezes. She has no rales.  Abdominal: Soft. Bowel sounds are normal. She exhibits no distension and no mass. There is no tenderness. There is no rebound and no guarding.  Lymphadenopathy:     She has no cervical adenopathy.  Neurological: She is alert and oriented to person, place, and time. No cranial nerve deficit.  Skin: Skin is warm and dry. No rash noted. She is not diaphoretic. No erythema. No pallor.  Psychiatric: She has a normal mood and affect. Her behavior is normal.   Results for orders placed or performed in visit on 03/06/17  POCT glucose (manual entry)  Result Value Ref Range   POC Glucose 66 (A) 70 - 99 mg/dl       Assessment & Plan:   1. Type 2 diabetes mellitus without complication, without long-term current use of insulin ([place])  2. Essential hypertension, benign   3. Gastroesophageal reflux disease without esophagitis   4. Severe obesity (BMI >= 40) (HCC)    -scheduled for upcoming gastric bypass and presenting today with hypotension and hypoglcyemia. -STOP Lisinopril. -stop Glimiperide. -check sugars every morning and at bedtime.   Orders Placed This Encounter  Procedures  . POCT glucose (manual entry)   No orders of the defined types were placed in this encounter.   Return in about 4 weeks (around 04/03/2017) for recheck.   Jeffree Cazeau Elayne Guerin, M.D. Primary Care at [facility] [address] [place], [place]  [address] [phone] phone [phone] fax

## 2017-03-06 NOTE — Progress Notes (Signed)
EKG 10-04-16 epic CXR 05-24-16 epic

## 2017-03-06 NOTE — Patient Instructions (Addendum)
[name]  03/06/2017   Your procedure is scheduled on: 03-12-17  Report to [facility] Main  Entrance Take [place]  elevators to 3rd floor to  [place] at 530AM.   Call this number if you have problems the morning of surgery [phone]    Remember: ONLY 1 PERSON MAY GO WITH YOU TO SHORT STAY TO GET  READY MORNING OF [place].  Do not eat food or drink liquids :After Midnight.     Take these medicines the morning of surgery with A SIP OF WATER: synthroid, loratadine(claritin)  DO NOT TAKE ANY DIABETIC MEDICATIONS DAY OF YOUR SURGERY                               You may not have any metal on your body including hair pins and              piercings  Do not wear jewelry, make-up, lotions, powders or perfumes, deodorant             Do not wear nail polish.  Do not shave  48 hours prior to surgery.     Do not bring valuables to the hospital. [place].  Contacts, dentures or bridgework may not be worn into surgery.  Leave suitcase in the car. After surgery it may be brought to your room.               Please read over the following fact sheets you were given: _____________________________________________________________________             How to Manage Your Diabetes Before and After Surgery  Why is it important to control my blood sugar before and after surgery? . Improving blood sugar levels before and after surgery helps healing and can limit problems. . A way of improving blood sugar control is eating a healthy diet by: o  Eating less sugar and carbohydrates o  Increasing activity/exercise o  Talking with your doctor about reaching your blood sugar goals . High blood sugars (greater than 180 mg/dL) can raise your risk of infections and slow your recovery, so you will need to focus on controlling your diabetes during the weeks before surgery. . Make sure that the doctor who takes care of  your diabetes knows about your planned surgery including the date and location.  How do I manage my blood sugar before surgery? . Check your blood sugar at least 4 times a day, starting 2 days before surgery, to make sure that the level is not too high or low. o Check your blood sugar the morning of your surgery when you wake up and every 2 hours until you get to the Short Stay unit. . If your blood sugar is less than 70 mg/dL, you will need to treat for low blood sugar: o Do not take insulin. o Treat a low blood sugar (less than 70 mg/dL) with  cup of clear juice (cranberry or apple), 4 glucose tablets, OR glucose gel. o Recheck blood sugar in 15 minutes after treatment (to make sure it is greater than 70 mg/dL). If your blood sugar is not greater than 70 mg/dL on recheck, call [phone] for further instructions. . Report your blood sugar to  the short stay nurse when you get to Short Stay.  . If you are admitted to the hospital after surgery: o Your blood sugar will be checked by the staff and you will probably be given insulin after surgery (instead of oral diabetes medicines) to make sure you have good blood sugar levels. o The goal for blood sugar control after surgery is 80-180 mg/dL.   WHAT DO I DO ABOUT MY DIABETES MEDICATION?  Marland Kitchen Do not take oral diabetes medicines (pills) the morning of surgery.  . THE DAY BEFORE SURGERY 03-11-17,   Take Metformin as usual   Take canagliflozin [facility]) as usual        . THE MORNING OF SURGERY 03-12-17, do not take any diabetes medication    Patient Signature:  Date:   Nurse Signature:  Date:   Reviewed and Endorsed by [facility] Patient Education Committee, August 2015  [facility] - Preparing for Surgery Before surgery, you can play an important role.  Because skin is not sterile, your skin needs to be as free of germs as possible.  You can reduce the number of germs on your skin by washing with CHG (chlorahexidine gluconate) soap  before surgery.  CHG is an antiseptic cleaner which kills germs and bonds with the skin to continue killing germs even after washing. Please DO NOT use if you have an allergy to CHG or antibacterial soaps.  If your skin becomes reddened/irritated stop using the CHG and inform your nurse when you arrive at Short Stay. Do not shave (including legs and underarms) for at least 48 hours prior to the first CHG shower.  You may shave your face/neck. Please follow these instructions carefully:  1.  Shower with CHG Soap the night before surgery and the  morning of Surgery.  2.  If you choose to wash your hair, wash your hair first as usual with your  normal  shampoo.  3.  After you shampoo, rinse your hair and body thoroughly to remove the  shampoo.                           4.  Use CHG as you would any other liquid soap.  You can apply chg directly  to the skin and wash                       Gently with a scrungie or clean washcloth.  5.  Apply the CHG Soap to your body ONLY FROM THE NECK DOWN.   Do not use on face/ open                           Wound or open sores. Avoid contact with eyes, ears mouth and genitals (private parts).                       Wash face,  Genitals (private parts) with your normal soap.             6.  Wash thoroughly, paying special attention to the area where your surgery  will be performed.  7.  Thoroughly rinse your body with warm water from the neck down.  8.  DO NOT shower/wash with your normal soap after using and rinsing off  the CHG Soap.                9.  Fraser Din  yourself dry with a clean towel.            10.  Wear clean pajamas.            11.  Place clean sheets on your bed the night of your first shower and do not  sleep with pets. Day of Surgery : Do not apply any lotions/deodorants the morning of surgery.  Please wear clean clothes to the hospital/surgery center.  FAILURE TO FOLLOW THESE INSTRUCTIONS MAY RESULT IN THE CANCELLATION OF YOUR  SURGERY  ________________________________________________________________________   Incentive Spirometer  An incentive spirometer is a tool that can help keep your lungs clear and active. This tool measures how well you are filling your lungs with each breath. Taking long deep breaths may help reverse or decrease the chance of developing breathing (pulmonary) problems (especially infection) following:  A long period of time when you are unable to move or be active. BEFORE THE PROCEDURE   If the spirometer includes an indicator to show your best effort, your nurse or respiratory therapist will set it to a desired goal.  If possible, sit up straight or lean slightly forward. Try not to slouch.  Hold the incentive spirometer in an upright position. INSTRUCTIONS FOR USE  1. Sit on the edge of your bed if possible, or sit up as far as you can in bed or on a chair. 2. Hold the incentive spirometer in an upright position. 3. Breathe out normally. 4. Place the mouthpiece in your mouth and seal your lips tightly around it. 5. Breathe in slowly and as deeply as possible, raising the piston or the ball toward the top of the column. 6. Hold your breath for 3-5 seconds or for as long as possible. Allow the piston or ball to fall to the bottom of the column. 7. Remove the mouthpiece from your mouth and breathe out normally. 8. Rest for a few seconds and repeat Steps 1 through 7 at least 10 times every 1-2 hours when you are awake. Take your time and take a few normal breaths between deep breaths. 9. The spirometer may include an indicator to show your best effort. Use the indicator as a goal to work toward during each repetition. 10. After each set of 10 deep breaths, practice coughing to be sure your lungs are clear. If you have an incision (the cut made at the time of surgery), support your incision when coughing by placing a pillow or rolled up towels firmly against it. Once you are able to get out  of bed, walk around indoors and cough well. You may stop using the incentive spirometer when instructed by your caregiver.  RISKS AND COMPLICATIONS  Take your time so you do not get dizzy or light-headed.  If you are in pain, you may need to take or ask for pain medication before doing incentive spirometry. It is harder to take a deep breath if you are having pain. AFTER USE  Rest and breathe slowly and easily.  It can be helpful to keep track of a log of your progress. Your caregiver can provide you with a simple table to help with this. If you are using the spirometer at home, follow these instructions: [place] IF:   You are having difficultly using the spirometer.  You have trouble using the spirometer as often as instructed.  Your pain medication is not giving enough relief while using the spirometer.  You develop fever of 100.5 F (38.1 C) or  higher. SEEK IMMEDIATE MEDICAL CARE IF:   You cough up bloody sputum that had not been present before.  You develop fever of 102 F (38.9 C) or greater.  You develop worsening pain at or near the incision site. MAKE SURE YOU:   Understand these instructions.  Will watch your condition.  Will get help right away if you are not doing well or get worse. Document Released: 02/25/2007 Document Revised: 01/07/2012 Document Reviewed: 04/28/2007 [facility] Patient Information 2014 [place], [place].   ________________________________________________________________________

## 2017-03-07 ENCOUNTER — Encounter (HOSPITAL_COMMUNITY)
Admission: RE | Admit: 2017-03-07 | Discharge: 2017-03-07 | Disposition: A | Discharge status: Home / Self Care | Payer: BLUE CROSS/BLUE SHIELD | Source: Ambulatory Visit | Attending: General Surgery | Admitting: General Surgery

## 2017-03-07 ENCOUNTER — Encounter (HOSPITAL_COMMUNITY): Payer: Self-pay

## 2017-03-07 DIAGNOSIS — Z6841 Body Mass Index (BMI) 40.0 and over, adult: Secondary | ICD-10-CM | POA: Diagnosis not present

## 2017-03-07 DIAGNOSIS — E669 Obesity, unspecified: Secondary | ICD-10-CM | POA: Diagnosis not present

## 2017-03-07 DIAGNOSIS — Z01812 Encounter for preprocedural laboratory examination: Secondary | ICD-10-CM | POA: Diagnosis not present

## 2017-03-07 LAB — GLUCOSE, CAPILLARY: Glucose-Capillary: 111 mg/dL — ABNORMAL HIGH (ref 65–99)

## 2017-03-07 LAB — CBC WITH DIFFERENTIAL/PLATELET
Basophils Absolute: 0.1 10*3/uL (ref 0.0–0.1)
Basophils Relative: 1 %
EOS ABS: 0.3 10*3/uL (ref 0.0–0.7)
EOS PCT: 3 %
HCT: 41.3 % (ref 36.0–46.0)
Hemoglobin: 13.4 g/dL (ref 12.0–15.0)
LYMPHS ABS: 3.4 10*3/uL (ref 0.7–4.0)
LYMPHS PCT: 32 %
MCH: 29 pg (ref 26.0–34.0)
MCHC: 32.4 g/dL (ref 30.0–36.0)
MCV: 89.4 fL (ref 78.0–100.0)
MONOS PCT: 6 %
Monocytes Absolute: 0.7 10*3/uL (ref 0.1–1.0)
Neutro Abs: 6 10*3/uL (ref 1.7–7.7)
Neutrophils Relative %: 58 %
PLATELETS: 346 10*3/uL (ref 150–400)
RBC: 4.62 MIL/uL (ref 3.87–5.11)
RDW: 14 % (ref 11.5–15.5)
WBC: 10.4 10*3/uL (ref 4.0–10.5)

## 2017-03-07 LAB — COMPREHENSIVE METABOLIC PANEL
ALT: 38 U/L (ref 14–54)
ANION GAP: 7 (ref 5–15)
AST: 30 U/L (ref 15–41)
Albumin: 3.8 g/dL (ref 3.5–5.0)
Alkaline Phosphatase: 59 U/L (ref 38–126)
BUN: 22 mg/dL — ABNORMAL HIGH (ref 6–20)
CALCIUM: 8.8 mg/dL — AB (ref 8.9–10.3)
CHLORIDE: 107 mmol/L (ref 101–111)
CO2: 26 mmol/L (ref 22–32)
CREATININE: 0.85 mg/dL (ref 0.44–1.00)
Glucose, Bld: 102 mg/dL — ABNORMAL HIGH (ref 65–99)
Potassium: 4 mmol/L (ref 3.5–5.1)
SODIUM: 140 mmol/L (ref 135–145)
Total Bilirubin: 0.4 mg/dL (ref 0.3–1.2)
Total Protein: 7.2 g/dL (ref 6.5–8.1)

## 2017-03-07 NOTE — Progress Notes (Signed)
At pre-op appt; pt reported to RN that she had noticed blood in her stool this morning and another occurrence this afternoon; unsure of hue of blood. Vital signs stable today.She denied any abdominal pain nor SOB. She did endorse some tiredness but related this to not getting enough sleep. RN encouraged patient to go to the emergency room, however patient refused and said that she would call her PCP and ask to come in. RN told patient that if she couldn't get an appt with PCP today, she will need to go to nearest ED. Patient verbalized understanding and agreeable to this.   After visit RN called over to [facility] [employer] account [employer]) office. Spoke with Armon in triage and informed her of aforementioned encounter. Armon states she will get in touch with [facility] and contact patient as necessary. RN left name and call back number in case need to be reached .

## 2017-03-08 ENCOUNTER — Telehealth: Payer: Self-pay | Admitting: General Surgery

## 2017-03-08 LAB — HEMOGLOBIN A1C
Hgb A1c MFr Bld: 7.6 % — ABNORMAL HIGH (ref 4.8–5.6)
MEAN PLASMA GLUCOSE: 171 mg/dL

## 2017-03-08 NOTE — Telephone Encounter (Signed)
-----   Message from Drucie Ip sent at 03/07/2017  3:25 PM EDT ----- Patient is scheduled to have a gastric bypass surgery on 5/15 with [facility]. At her pre-op appointment, patient informed the nurse Ichachi that she had noticed blood in her stools this morning. Advised to go to the ED but stated that she would contact her PCP to see if they could see her today. Advised to go to still go to the ED, if her patient could not get an appointment today. Patient appeared reluctant to go. You can call [phone] to speak with the nurse. Will not be available after 4:30 but will be back on duty at 8 am tomorrow.

## 2017-03-12 ENCOUNTER — Encounter (HOSPITAL_COMMUNITY): Payer: Self-pay | Admitting: *Deleted

## 2017-03-12 ENCOUNTER — Inpatient Hospital Stay (HOSPITAL_COMMUNITY): Payer: BLUE CROSS/BLUE SHIELD | Admitting: Anesthesiology

## 2017-03-12 ENCOUNTER — Encounter (HOSPITAL_COMMUNITY)
Admission: RE | Disposition: A | Discharge status: Home / Self Care | Payer: Self-pay | Source: Ambulatory Visit | Attending: General Surgery

## 2017-03-12 ENCOUNTER — Inpatient Hospital Stay (HOSPITAL_COMMUNITY)
Admission: RE | Admit: 2017-03-12 | Discharge: 2017-03-14 | DRG: 620 | Disposition: A | Discharge status: Home / Self Care | Payer: BLUE CROSS/BLUE SHIELD | Source: Ambulatory Visit | Attending: General Surgery | Admitting: General Surgery

## 2017-03-12 DIAGNOSIS — Z6841 Body Mass Index (BMI) 40.0 and over, adult: Secondary | ICD-10-CM

## 2017-03-12 DIAGNOSIS — E039 Hypothyroidism, unspecified: Secondary | ICD-10-CM | POA: Diagnosis not present

## 2017-03-12 DIAGNOSIS — E119 Type 2 diabetes mellitus without complications: Secondary | ICD-10-CM | POA: Diagnosis not present

## 2017-03-12 DIAGNOSIS — Z79899 Other long term (current) drug therapy: Secondary | ICD-10-CM

## 2017-03-12 DIAGNOSIS — D649 Anemia, unspecified: Secondary | ICD-10-CM

## 2017-03-12 DIAGNOSIS — Z7984 Long term (current) use of oral hypoglycemic drugs: Secondary | ICD-10-CM | POA: Diagnosis not present

## 2017-03-12 DIAGNOSIS — Z9884 Bariatric surgery status: Secondary | ICD-10-CM

## 2017-03-12 DIAGNOSIS — G4733 Obstructive sleep apnea (adult) (pediatric): Secondary | ICD-10-CM | POA: Diagnosis present

## 2017-03-12 DIAGNOSIS — I10 Essential (primary) hypertension: Secondary | ICD-10-CM | POA: Diagnosis not present

## 2017-03-12 DIAGNOSIS — D62 Acute posthemorrhagic anemia: Secondary | ICD-10-CM | POA: Diagnosis not present

## 2017-03-12 DIAGNOSIS — I7102 Dissection of abdominal aorta: Secondary | ICD-10-CM | POA: Diagnosis not present

## 2017-03-12 HISTORY — DX: Bariatric surgery status: Z98.84

## 2017-03-12 HISTORY — PX: UPPER GI ENDOSCOPY: SHX6162

## 2017-03-12 HISTORY — PX: GASTRIC ROUX-EN-Y: SHX5262

## 2017-03-12 LAB — CBC
HCT: 43.2 % (ref 36.0–46.0)
HEMOGLOBIN: 14 g/dL (ref 12.0–15.0)
MCH: 29 pg (ref 26.0–34.0)
MCHC: 32.4 g/dL (ref 30.0–36.0)
MCV: 89.4 fL (ref 78.0–100.0)
Platelets: 354 10*3/uL (ref 150–400)
RBC: 4.83 MIL/uL (ref 3.87–5.11)
RDW: 13.9 % (ref 11.5–15.5)
WBC: 15 10*3/uL — ABNORMAL HIGH (ref 4.0–10.5)

## 2017-03-12 LAB — CREATININE, SERUM
Creatinine, Ser: 1 mg/dL (ref 0.44–1.00)
GFR calc non Af Amer: >60 mL/min (ref >60)

## 2017-03-12 LAB — GLUCOSE, CAPILLARY
Glucose-Capillary: 99 mg/dL (ref 65–99)
Glucose-Capillary: 202 mg/dL — ABNORMAL HIGH (ref 65–99)

## 2017-03-12 LAB — PREGNANCY, URINE: Preg Test, Ur: NEGATIVE

## 2017-03-12 SURGERY — LAPAROSCOPIC ROUX-EN-Y GASTRIC BYPASS WITH UPPER ENDOSCOPY
Anesthesia: General | Site: Abdomen

## 2017-03-12 MED ORDER — PANTOPRAZOLE SODIUM 40 MG IV SOLR
40.0000 mg | Freq: Every day | INTRAVENOUS | Status: DC
  Administered 2017-03-12 – 2017-03-13 (×2): 40 mg via INTRAVENOUS
  Filled 2017-03-12 (×2): qty 40

## 2017-03-12 MED ORDER — ROCURONIUM BROMIDE 50 MG/5ML IV SOSY
PREFILLED_SYRINGE | INTRAVENOUS | Status: AC
  Filled 2017-03-12: qty 5

## 2017-03-12 MED ORDER — CEFOTETAN DISODIUM-DEXTROSE 2-2.08 GM-% IV SOLR
INTRAVENOUS | Status: AC
Start: 2017-03-12 — End: 2017-03-12
  Filled 2017-03-12: qty 50

## 2017-03-12 MED ORDER — ONDANSETRON HCL 4 MG/2ML IJ SOLN: 4.0000 mg | Freq: Once | INTRAMUSCULAR | Status: DC | PRN

## 2017-03-12 MED ORDER — BUPIVACAINE LIPOSOME 1.3 % IJ SUSP
INTRAMUSCULAR | Status: DC | PRN
  Administered 2017-03-12: 20 mL

## 2017-03-12 MED ORDER — SCOPOLAMINE 1 MG/3DAYS TD PT72
1.0000 | MEDICATED_PATCH | TRANSDERMAL | Status: DC
  Administered 2017-03-12: 1.5 mg via TRANSDERMAL
  Filled 2017-03-12: qty 1

## 2017-03-12 MED ORDER — CEFOTETAN DISODIUM-DEXTROSE 2-2.08 GM-% IV SOLR
2.0000 g | INTRAVENOUS | Status: AC
  Administered 2017-03-12: 2 g via INTRAVENOUS

## 2017-03-12 MED ORDER — GABAPENTIN 300 MG PO CAPS
300.0000 mg | ORAL_CAPSULE | ORAL | Status: AC
  Administered 2017-03-12: 300 mg via ORAL
  Filled 2017-03-12: qty 1

## 2017-03-12 MED ORDER — SUGAMMADEX SODIUM 500 MG/5ML IV SOLN
INTRAVENOUS | Status: AC
  Filled 2017-03-12: qty 5

## 2017-03-12 MED ORDER — ACETAMINOPHEN 500 MG PO TABS
1000.0000 mg | ORAL_TABLET | ORAL | Status: AC
  Administered 2017-03-12: 1000 mg via ORAL
  Filled 2017-03-12: qty 2

## 2017-03-12 MED ORDER — ENOXAPARIN SODIUM 40 MG/0.4ML ~~LOC~~ SOLN
40.0000 mg | SUBCUTANEOUS | Status: AC
  Administered 2017-03-12: 40 mg via SUBCUTANEOUS
  Filled 2017-03-12: qty 0.4

## 2017-03-12 MED ORDER — FENTANYL CITRATE (PF) 100 MCG/2ML IJ SOLN
INTRAMUSCULAR | Status: AC
  Filled 2017-03-12: qty 2

## 2017-03-12 MED ORDER — LIDOCAINE 2% (20 MG/ML) 5 ML SYRINGE
INTRAMUSCULAR | Status: AC
  Filled 2017-03-12: qty 5

## 2017-03-12 MED ORDER — 0.9 % SODIUM CHLORIDE (POUR BTL) OPTIME
TOPICAL | Status: DC | PRN
  Administered 2017-03-12: 1000 mL

## 2017-03-12 MED ORDER — MIDAZOLAM HCL 2 MG/2ML IJ SOLN
INTRAMUSCULAR | Status: DC | PRN
  Administered 2017-03-12 (×2): 1 mg via INTRAVENOUS

## 2017-03-12 MED ORDER — OXYCODONE HCL 5 MG/5ML PO SOLN
5.0000 mg | ORAL | Status: DC | PRN
  Administered 2017-03-13 – 2017-03-14 (×3): 5 mg via ORAL
  Filled 2017-03-12 (×3): qty 5

## 2017-03-12 MED ORDER — MIDAZOLAM HCL 2 MG/2ML IJ SOLN
INTRAMUSCULAR | Status: AC
  Filled 2017-03-12: qty 2

## 2017-03-12 MED ORDER — SUGAMMADEX SODIUM 200 MG/2ML IV SOLN
INTRAVENOUS | Status: DC | PRN
  Administered 2017-03-12: 300 mg via INTRAVENOUS

## 2017-03-12 MED ORDER — CHLORHEXIDINE GLUCONATE 4 % EX LIQD: 60.0000 mL | Freq: Once | CUTANEOUS | Status: DC

## 2017-03-12 MED ORDER — SUCCINYLCHOLINE CHLORIDE 200 MG/10ML IV SOSY
PREFILLED_SYRINGE | INTRAVENOUS | Status: DC | PRN
  Administered 2017-03-12: 120 mg via INTRAVENOUS

## 2017-03-12 MED ORDER — BUPIVACAINE LIPOSOME 1.3 % IJ SUSP
20.0000 mL | Freq: Once | INTRAMUSCULAR | Status: DC
  Filled 2017-03-12: qty 20

## 2017-03-12 MED ORDER — FENTANYL CITRATE (PF) 100 MCG/2ML IJ SOLN
25.0000 ug | INTRAMUSCULAR | Status: DC | PRN
  Administered 2017-03-12 (×2): 50 ug via INTRAVENOUS

## 2017-03-12 MED ORDER — DEXAMETHASONE SODIUM PHOSPHATE 10 MG/ML IJ SOLN
INTRAMUSCULAR | Status: DC | PRN
  Administered 2017-03-12: 10 mg via INTRAVENOUS

## 2017-03-12 MED ORDER — FENTANYL CITRATE (PF) 250 MCG/5ML IJ SOLN
INTRAMUSCULAR | Status: DC | PRN
  Administered 2017-03-12 (×2): 50 ug via INTRAVENOUS

## 2017-03-12 MED ORDER — ACETAMINOPHEN 325 MG PO TABS
650.0000 mg | ORAL_TABLET | ORAL | Status: DC | PRN
  Administered 2017-03-13: 650 mg via ORAL
  Filled 2017-03-12: qty 2

## 2017-03-12 MED ORDER — BUPIVACAINE HCL 0.25 % IJ SOLN
INTRAMUSCULAR | Status: DC | PRN
  Administered 2017-03-12: 30 mL

## 2017-03-12 MED ORDER — APREPITANT 40 MG PO CAPS
40.0000 mg | ORAL_CAPSULE | ORAL | Status: AC
  Administered 2017-03-12: 40 mg via ORAL
  Filled 2017-03-12: qty 1

## 2017-03-12 MED ORDER — LACTATED RINGERS IV SOLN
INTRAVENOUS | Status: DC | PRN
  Administered 2017-03-12 (×2): via INTRAVENOUS

## 2017-03-12 MED ORDER — EPHEDRINE SULFATE 50 MG/ML IJ SOLN
INTRAMUSCULAR | Status: DC | PRN
  Administered 2017-03-12 (×3): 10 mg via INTRAVENOUS

## 2017-03-12 MED ORDER — PROPOFOL 10 MG/ML IV BOLUS
INTRAVENOUS | Status: AC
  Filled 2017-03-12: qty 20

## 2017-03-12 MED ORDER — MORPHINE SULFATE (PF) 4 MG/ML IV SOLN
1.0000 mg | INTRAVENOUS | Status: DC | PRN
  Administered 2017-03-12 – 2017-03-13 (×3): 2 mg via INTRAVENOUS
  Filled 2017-03-12: qty 0.8
  Filled 2017-03-12: qty 1
  Filled 2017-03-12: qty 0.8
  Filled 2017-03-12: qty 1
  Filled 2017-03-12: qty 0.8
  Filled 2017-03-12: qty 1

## 2017-03-12 MED ORDER — ENOXAPARIN SODIUM 30 MG/0.3ML ~~LOC~~ SOLN
30.0000 mg | Freq: Two times a day (BID) | SUBCUTANEOUS | Status: DC
  Administered 2017-03-13 (×2): 30 mg via SUBCUTANEOUS
  Filled 2017-03-12 (×2): qty 0.3

## 2017-03-12 MED ORDER — ONDANSETRON HCL 4 MG/2ML IJ SOLN: 4.0000 mg | INTRAMUSCULAR | Status: DC | PRN

## 2017-03-12 MED ORDER — ONDANSETRON HCL 4 MG/2ML IJ SOLN
INTRAMUSCULAR | Status: AC
  Filled 2017-03-12: qty 2

## 2017-03-12 MED ORDER — LACTATED RINGERS IR SOLN
Status: DC | PRN
  Administered 2017-03-12: 1000 mL

## 2017-03-12 MED ORDER — FENTANYL CITRATE (PF) 250 MCG/5ML IJ SOLN
INTRAMUSCULAR | Status: AC
  Filled 2017-03-12: qty 5

## 2017-03-12 MED ORDER — ACETAMINOPHEN 160 MG/5ML PO SOLN
325.0000 mg | ORAL | Status: DC | PRN
Start: 2017-03-12 — End: 2017-03-14

## 2017-03-12 MED ORDER — LIDOCAINE 2% (20 MG/ML) 5 ML SYRINGE
INTRAMUSCULAR | Status: DC | PRN
  Administered 2017-03-12: 100 mg via INTRAVENOUS

## 2017-03-12 MED ORDER — DEXAMETHASONE SODIUM PHOSPHATE 10 MG/ML IJ SOLN
INTRAMUSCULAR | Status: AC
  Filled 2017-03-12: qty 1

## 2017-03-12 MED ORDER — DEXAMETHASONE SODIUM PHOSPHATE 4 MG/ML IJ SOLN: 4.0000 mg | INTRAMUSCULAR | Status: DC

## 2017-03-12 MED ORDER — ONDANSETRON HCL 4 MG/2ML IJ SOLN
INTRAMUSCULAR | Status: DC | PRN
  Administered 2017-03-12: 4 mg via INTRAVENOUS

## 2017-03-12 MED ORDER — SUCCINYLCHOLINE CHLORIDE 200 MG/10ML IV SOSY
PREFILLED_SYRINGE | INTRAVENOUS | Status: AC
  Filled 2017-03-12: qty 10

## 2017-03-12 MED ORDER — PREMIER PROTEIN SHAKE
2.0000 [oz_av] | ORAL | Status: DC
  Administered 2017-03-14 (×2): 2 [oz_av] via ORAL
  Filled 2017-03-12: qty 325.31

## 2017-03-12 MED ORDER — LACTATED RINGERS IV SOLN: INTRAVENOUS | Status: DC

## 2017-03-12 MED ORDER — ROCURONIUM BROMIDE 50 MG/5ML IV SOSY
PREFILLED_SYRINGE | INTRAVENOUS | Status: DC | PRN
  Administered 2017-03-12: 10 mg via INTRAVENOUS
  Administered 2017-03-12 (×2): 20 mg via INTRAVENOUS
  Administered 2017-03-12: 50 mg via INTRAVENOUS

## 2017-03-12 MED ORDER — PROPOFOL 10 MG/ML IV BOLUS
INTRAVENOUS | Status: DC | PRN
  Administered 2017-03-12: 200 mg via INTRAVENOUS

## 2017-03-12 MED ORDER — SODIUM CHLORIDE 0.9 % IV SOLN
INTRAVENOUS | Status: DC
  Administered 2017-03-13: 20:00:00 via INTRAVENOUS
  Administered 2017-03-13: 1000 mL via INTRAVENOUS
  Administered 2017-03-14: 06:00:00 via INTRAVENOUS

## 2017-03-12 MED ORDER — SUGAMMADEX SODIUM 200 MG/2ML IV SOLN
INTRAVENOUS | Status: AC
  Filled 2017-03-12: qty 2

## 2017-03-12 MED ORDER — BUPIVACAINE HCL (PF) 0.25 % IJ SOLN
INTRAMUSCULAR | Status: AC
  Filled 2017-03-12: qty 30

## 2017-03-12 SURGICAL SUPPLY — 72 items
APL SKNCLS STERI-STRIP NONHPOA (GAUZE/BANDAGES/DRESSINGS) ×2
APPLIER CLIP 5 13 M/L LIGAMAX5 (MISCELLANEOUS)
APPLIER CLIP ROT 10 11.4 M/L (STAPLE)
APPLIER CLIP ROT 13.4 12 LRG (CLIP)
APR CLP LRG 13.4X12 ROT 20 MLT (CLIP)
APR CLP MED LRG 11.4X10 (STAPLE)
APR CLP MED LRG 5 ANG JAW (MISCELLANEOUS)
BANDAGE ADH SHEER 1  50/CT (GAUZE/BANDAGES/DRESSINGS) IMPLANT
BENZOIN TINCTURE PRP APPL 2/3 (GAUZE/BANDAGES/DRESSINGS) ×1 IMPLANT
BLADE SURG SZ11 CARB STEEL (BLADE) ×3 IMPLANT
CABLE HIGH FREQUENCY MONO STRZ (ELECTRODE) ×3 IMPLANT
CHLORAPREP W/TINT 26ML (MISCELLANEOUS) ×4 IMPLANT
CLIP APPLIE 5 13 M/L LIGAMAX5 (MISCELLANEOUS) IMPLANT
CLIP APPLIE ROT 10 11.4 M/L (STAPLE) IMPLANT
CLIP APPLIE ROT 13.4 12 LRG (CLIP) IMPLANT
COVER SURGICAL LIGHT HANDLE (MISCELLANEOUS) ×3 IMPLANT
DEVICE SUTURE ENDOST 10MM (ENDOMECHANICALS) ×3 IMPLANT
DRAIN CHANNEL 19F RND (DRAIN) IMPLANT
DRAIN PENROSE 18X1/4 LTX STRL (WOUND CARE) ×3 IMPLANT
ELECT L-HOOK LAP 45CM DISP (ELECTROSURGICAL) ×3
ELECT PENCIL ROCKER SW 15FT (MISCELLANEOUS) ×3 IMPLANT
ELECTRODE L-HOOK LAP 45CM DISP (ELECTROSURGICAL) ×2 IMPLANT
EVACUATOR SILICONE 100CC (DRAIN) IMPLANT
GAUZE SPONGE 4X4 12PLY STRL (GAUZE/BANDAGES/DRESSINGS) IMPLANT
GAUZE SPONGE 4X4 16PLY XRAY LF (GAUZE/BANDAGES/DRESSINGS) ×3 IMPLANT
GLOVE BIOGEL PI IND STRL 7.0 (GLOVE) ×2 IMPLANT
GLOVE BIOGEL PI INDICATOR 7.0 (GLOVE) ×1
GLOVE SURG SS PI 7.0 STRL IVOR (GLOVE) ×3 IMPLANT
GOWN STRL REUS W/TWL LRG LVL3 (GOWN DISPOSABLE) ×3 IMPLANT
GOWN STRL REUS W/TWL XL LVL3 (GOWN DISPOSABLE) ×9 IMPLANT
HANDLE STAPLE EGIA 4 XL (STAPLE) ×3 IMPLANT
HOVERMATT SINGLE USE (MISCELLANEOUS) ×3 IMPLANT
IRRIG SUCT STRYKERFLOW 2 WTIP (MISCELLANEOUS)
IRRIGATION SUCT STRKRFLW 2 WTP (MISCELLANEOUS) ×2 IMPLANT
KIT BASIN OR (CUSTOM PROCEDURE TRAY) ×3 IMPLANT
KIT GASTRIC LAVAGE 34FR ADT (SET/KITS/TRAYS/PACK) IMPLANT
MARKER SKIN DUAL TIP RULER LAB (MISCELLANEOUS) ×3 IMPLANT
NDL SPNL 22GX3.5 QUINCKE BK (NEEDLE) ×2 IMPLANT
NEEDLE SPNL 22GX3.5 QUINCKE BK (NEEDLE) ×3 IMPLANT
PACK CARDIOVASCULAR III (CUSTOM PROCEDURE TRAY) ×3 IMPLANT
RELOAD EGIA 45 MED/THCK PURPLE (STAPLE) ×3 IMPLANT
RELOAD EGIA 45 TAN VASC (STAPLE) IMPLANT
RELOAD EGIA 60 MED/THCK PURPLE (STAPLE) ×9 IMPLANT
RELOAD EGIA 60 TAN VASC (STAPLE) ×9 IMPLANT
RELOAD ENDO STITCH 2.0 (ENDOMECHANICALS) ×33
RELOAD STAPLE 60 MED/THCK ART (STAPLE) ×8 IMPLANT
RELOAD SUT SNGL STCH ABSRB 2-0 (ENDOMECHANICALS) ×10 IMPLANT
RELOAD SUT SNGL STCH BLK 2-0 (ENDOMECHANICALS) ×10 IMPLANT
SCISSORS LAP 5X45 EPIX DISP (ENDOMECHANICALS) ×3 IMPLANT
SHEARS HARMONIC ACE PLUS 45CM (MISCELLANEOUS) ×3 IMPLANT
SLEEVE XCEL OPT CAN 5 100 (ENDOMECHANICALS) ×9 IMPLANT
SOLUTION ANTI FOG 6CC (MISCELLANEOUS) ×3 IMPLANT
STAPLER VISISTAT 35W (STAPLE) IMPLANT
STRIP CLOSURE SKIN 1/2X4 (GAUZE/BANDAGES/DRESSINGS) ×1 IMPLANT
SUT ETHILON 2 0 PS N (SUTURE) IMPLANT
SUT MNCRL AB 4-0 PS2 18 (SUTURE) ×3 IMPLANT
SUT RELOAD ENDO STITCH 2 48X1 (ENDOMECHANICALS) ×12
SUT RELOAD ENDO STITCH 2.0 (ENDOMECHANICALS) ×10
SUT SILK 0 SH 30 (SUTURE) IMPLANT
SUTURE RELOAD END STTCH 2 48X1 (ENDOMECHANICALS) ×12 IMPLANT
SUTURE RELOAD ENDO STITCH 2.0 (ENDOMECHANICALS) ×10 IMPLANT
SYR 20CC LL (SYRINGE) ×3 IMPLANT
SYR 50ML LL SCALE MARK (SYRINGE) ×3 IMPLANT
TOWEL OR 17X26 10 PK STRL BLUE (TOWEL DISPOSABLE) ×3 IMPLANT
TOWEL OR NON WOVEN STRL DISP B (DISPOSABLE) ×3 IMPLANT
TRAY FOLEY W/METER SILVER 14FR (SET/KITS/TRAYS/PACK) ×1 IMPLANT
TRAY FOLEY W/METER SILVER 16FR (SET/KITS/TRAYS/PACK) ×2 IMPLANT
TROCAR BLADELESS OPT 5 100 (ENDOMECHANICALS) ×3 IMPLANT
TROCAR XCEL 12X100 BLDLESS (ENDOMECHANICALS) ×3 IMPLANT
TUBING CONNECTING 10 (TUBING) ×4 IMPLANT
TUBING ENDO SMARTCAP PENTAX (MISCELLANEOUS) ×3 IMPLANT
TUBING INSUF HEATED (TUBING) ×3 IMPLANT

## 2017-03-12 NOTE — H&P (View-Only) (Signed)
History of Present Illness Geoffery Spruce MD; 02/28/2017 12:13 PM) Patient words: pre-op.  The patient is a 41 year old female who presents for a bariatric surgery evaluation. Patient has completed all necessary workup in the preoperative phase for bariatric surgery. The patient is now ready to proceed with surgery. The patient has made moderate improvements in her diet and is exercising well and has no new medical issues or new medications.    Allergies Malachi Bonds, CMA; 02/28/2017 11:52 AM) No Known Drug Allergies 05/10/2016  Medication History (Chemira Jones, CMA; 02/28/2017 11:53 AM) HydroCHLOROthiazide (25MG  Tablet, Oral) Active. Onglyza (5MG  Tablet, Oral) Active. Diclofenac Sodium (75MG  Tablet DR, Oral) Active. Glimepiride (4MG  Tablet, Oral) Active. Methocarbamol (750MG  Tablet, Oral) Active. Nystatin (100000 UNIT/GM Powder, External) Active. ValACYclovir HCl (500MG  Tablet, Oral) Active. Fluticasone Propionate (50MCG/ACT Suspension, Nasal) Active. Invokana (100MG  Tablet, Oral) Active. Lisinopril (10MG  Tablet, Oral) Active. Cyclobenzaprine HCl (5MG  Tablet, Oral) Active. Clobetasol Propionate (0.05% Cream, External) Active. MetFORMIN HCl (500MG  Tablet, Oral) Active. Olopatadine HCl (0.2% Solution, Ophthalmic) Active. Medications Reconciled    Review of Systems Geoffery Spruce MD; 02/28/2017 12:13 PM) General Present- Weight Gain. Not Present- Appetite Loss, Chills, Fatigue, Fever, Night Sweats and Weight Loss. Skin Not Present- Change in Wart/Mole, Dryness, Hives, Jaundice, New Lesions, Non-Healing Wounds, Rash and Ulcer. HEENT Present- Wears glasses/contact lenses. Not Present- Earache, Hearing Loss, Hoarseness, Nose Bleed, Oral Ulcers, Ringing in the Ears, Seasonal Allergies, Sinus Pain, Sore Throat, Visual Disturbances and Yellow Eyes. Respiratory Present- Snoring. Not Present- Bloody sputum, Chronic Cough, Difficulty Breathing and Wheezing. Breast Not  Present- Breast Mass, Breast Pain, Nipple Discharge and Skin Changes. Cardiovascular Present- Leg Cramps. Not Present- Chest Pain, Difficulty Breathing Lying Down, Palpitations, Rapid Heart Rate, Shortness of Breath and Swelling of Extremities. Gastrointestinal Not Present- Abdominal Pain, Bloating, Bloody Stool, Change in Bowel Habits, Chronic diarrhea, Constipation, Difficulty Swallowing, Excessive gas, Gets full quickly at meals, Hemorrhoids, Indigestion, Nausea, Rectal Pain and Vomiting. Female Genitourinary Present- Frequency. Not Present- Nocturia, Painful Urination, Pelvic Pain and Urgency. Musculoskeletal Present- Back Pain and Muscle Pain. Not Present- Joint Pain, Joint Stiffness, Muscle Weakness and Swelling of Extremities. Endocrine Present- New Diabetes. Not Present- Cold Intolerance, Excessive Hunger, Hair Changes, Heat Intolerance and Hot flashes.  Vitals (Chemira Jones CMA; 02/28/2017 11:51 AM) 02/28/2017 11:51 AM Weight: 277.6 lb Height: 62in Body Surface Area: 2.2 m Body Mass Index: 50.77 kg/m  Temp.: 98.40F(Oral)  BP: 130/80 (Sitting, Left Arm, Standard)       Physical Exam Geoffery Spruce MD; 02/28/2017 12:13 PM) General Mental Status-Alert. General Appearance-Cooperative. Orientation-Oriented X4. Build & Nutrition-Obese. Posture-Normal posture.  Integumentary Global Assessment Upon inspection and palpation of skin surfaces of the - Head/Face: no rashes, ulcers, lesions or evidence of photo damage. No palpable nodules or masses and Neck: no visible lesions or palpable masses.  Head and Neck Head-normocephalic, atraumatic with no lesions or palpable masses. Face Global Assessment - atraumatic. Thyroid Gland Characteristics - normal size and consistency.  Eye Eyeball - Bilateral-Extraocular movements intact. Sclera/Conjunctiva - Bilateral-No scleral icterus, No Discharge.  ENMT Nose and Sinuses External Inspection of the Nose - no  deformities observed, no swelling present.  Chest and Lung Exam Palpation Palpation of the chest reveals - Non-tender. Auscultation Breath sounds - Normal.  Cardiovascular Auscultation Rhythm - Regular. Heart Sounds - S1 WNL and S2 WNL. Carotid arteries - No Carotid bruit.  Abdomen Inspection Inspection of the abdomen reveals - No Visible peristalsis, No Abnormal pulsations and No Paradoxical movements. Palpation/Percussion Palpation and Percussion  of the abdomen reveal - Soft, Non Tender, No Rebound tenderness, No Rigidity (guarding), No hepatosplenomegaly and No Palpable abdominal masses. Note: large abdominal obesity, no skin break down in folds, no umbilical hernia   Peripheral Vascular Upper Extremity Palpation - Pulses bilaterally normal. Lower Extremity Palpation - Edema - Bilateral - No edema.  Neurologic Neurologic evaluation reveals -normal sensation and normal coordination.  Neuropsychiatric Mental status exam performed with findings of-able to articulate well with normal speech/language, rate, volume and coherence and thought content normal with ability to perform basic computations and apply abstract reasoning.  Musculoskeletal Normal Exam - Bilateral-Upper Extremity Strength Normal and Lower Extremity Strength Normal.    Assessment & Plan Geoffery Spruce MD; 02/28/2017 12:14 PM) MORBID OBESITY (E66.01) Story: 41 yo female with obesity class III. She has dm, OSA, HTN. She has completed all requirements and is ready to proceed with RNY gastric bypass Impression: The patient meets weight loss surgery criteria. Due to the above reasons, I think laparoscopic RNY gastric bypass is the best option for the patient.  We discussed RNY gastric bypass. We discussed the preoperative, operative and postoperative process. I explained the surgery in detail including the performance of an EGD near the end of the surgery to test for leak. We discussed the typical  hospital course including a 2-3 day stay baring any complications. The patient was given educational material. I quoted the patient that most patients can lose up to 50-70% of their excess weight. We did discuss the possibility of weight regain several years after the procedure.  The risks of infection, bleeding, pain, scarring, weight regain, too little or too much weight loss, vitamin deficiencies and need for lifelong vitamin supplementation, hair loss, need for protein supplementation, leaks, stricture, reflux, food intolerance, gallstone formation, hernia, need for reoperation, need for open surgery, injury to spleen or surrounding structures, DVT's, PE, and death again discussed with the patient and the patient expressed understanding and desires to proceed with laparoscopic Roux en Y gastric bypass, possible open, intraoperative endoscopy.  We discussed that before and after surgery that there would be an alteration in their diet. I explained that we have put them on a diet 2 weeks before surgery. I also explained that they would be on a liquid diet for 2 weeks after surgery. We discussed that they would have to avoid certain foods after surgery. We discussed the importance of physical activity as well as compliance with our dietary and supplement recommendations and routine follow-up. ESSENTIAL HYPERTENSION (I10) Impression: On one medication with good blood pressure here today. DIABETES MELLITUS (E11.9) Impression: On 3 oral medications. OBSTRUCTIVE SLEEP APNEA (G47.33) Impression: does not wear mask on regularity due to social stigma

## 2017-03-12 NOTE — Op Note (Signed)
[name] [id number] 18-Jan-1976 03/12/2017  Preoperative diagnosis: morbid obesity  Postoperative diagnosis: Same   Procedure: Upper endoscopy   Surgeon: Gayland Curry M.D., FACS   Anesthesia: Gen.   Indications for procedure: 41 y.o. yo female undergoing a laparoscopic roux en y gastric bypass and an upper endoscopy was requested to evaluate the anastomosis.  Description of procedure: After we have completed the new gastrojejunostomy, I scrubbed out and obtained the Olympus endoscope. I gently placed endoscope in the patient's oropharynx and gently glided it down the esophagus without any difficulty under direct visualization. Once I was in the gastric pouch, I insufflated the pouch was air. The pouch was approximately 3.5 cm vertical in size. I was able to cannulate and advanced the scope through the gastrojejunostomy. Dr. Kieth Brightly had placed saline in the upper abdomen. Upon further insufflation of the gastric pouch there was no evidence of bubbles. Upon further inspection of the gastric pouch, the mucosa appeared normal. There is no evidence of any mucosal abnormality. The gastric pouch and Roux limb were decompressed. The width of the gastrojejunal anastomosis was at least 2 cm. The scope was withdrawn. The patient tolerated this portion of the procedure well. Please see Dr Amie Portland operative note for details regarding the laparoscopic roux-en-y gastric bypass.  Leighton Ruff. Redmond Pulling, MD, FACS General, Bariatric, & Minimally Invasive Surgery [facility], [place]

## 2017-03-12 NOTE — Interval H&P Note (Signed)
History and Physical Interval Note:  03/12/2017 7:08 AM  [name]  has presented today for surgery, with the diagnosis of MORBID OBESITY  The various methods of treatment have been discussed with the patient and family. After consideration of risks, benefits and other options for treatment, the patient has consented to  Procedure(s): LAPAROSCOPIC ROUX-EN-Y GASTRIC BYPASS WITH UPPER ENDOSCOPY (N/A) as a surgical intervention .  The patient's history has been reviewed, patient examined, no change in status, stable for surgery.  I have reviewed the patient's chart and labs.  Questions were answered to the patient's satisfaction.     Arta Bruce Alona Danford

## 2017-03-12 NOTE — Anesthesia Procedure Notes (Signed)
Procedure Name: Intubation Date/Time: 03/12/2017 7:33 AM Performed by: Dione Booze Pre-anesthesia Checklist: Patient identified, Emergency Drugs available, Suction available and Patient being monitored Patient Re-evaluated:Patient Re-evaluated prior to inductionOxygen Delivery Method: Circle system utilized Preoxygenation: Pre-oxygenation with 100% oxygen Intubation Type: IV induction Laryngoscope Size: Glidescope and 4 Grade View: Grade II Tube type: Parker flex tip Tube size: 7.5 mm Number of attempts: 1 Airway Equipment and Method: Video-laryngoscopy and Stylet Placement Confirmation: ETT inserted through vocal cords under direct vision,  positive ETCO2 and breath sounds checked- equal and bilateral Tube secured with: Tape Difficulty Due To: Difficulty was anticipated, Difficult Airway- due to large tongue and Difficult Airway- due to limited oral opening Comments: Intubated by Dr. Linna Caprice. Pt with small mouth, elective glidescope.

## 2017-03-12 NOTE — Anesthesia Preprocedure Evaluation (Addendum)
Anesthesia Evaluation  Patient identified by MRN, date of birth, ID band Patient awake    Reviewed: Allergy & Precautions, NPO status , Patient's Chart, lab work & pertinent test results  Airway Mallampati: III  TM Distance: >3 FB Neck ROM: Full    Dental  (+) Teeth Intact, Dental Advisory Given   Pulmonary    breath sounds clear to auscultation       Cardiovascular hypertension,  Rhythm:Regular Rate:Normal     Neuro/Psych    GI/Hepatic   Endo/Other  diabetes  Renal/GU      Musculoskeletal   Abdominal   Peds  Hematology   Anesthesia Other Findings   Reproductive/Obstetrics                            Anesthesia Physical Anesthesia Plan  ASA: III  Anesthesia Plan: General   Post-op Pain Management:    Induction: Intravenous  Airway Management Planned: Oral ETT  Additional Equipment:   Intra-op Plan:   Post-operative Plan: Extubation in OR  Informed Consent: I have reviewed the patients History and Physical, chart, labs and discussed the procedure including the risks, benefits and alternatives for the proposed anesthesia with the patient or authorized representative who has indicated his/her understanding and acceptance.   Dental advisory given  Plan Discussed with: CRNA and Anesthesiologist  Anesthesia Plan Comments: (Morbid Obesity  Type 2 DM cbg 99 hypertension Sleep apnea untreated  Plan GA with glide scope)       Anesthesia Quick Evaluation

## 2017-03-12 NOTE — Transfer of Care (Signed)
Immediate Anesthesia Transfer of Care Note  Patient: [name]  Procedure(s) Performed: Procedure(s): LAPAROSCOPIC ROUX-EN-Y GASTRIC BYPASS WITH UPPER ENDOSCOPY (N/A) UPPER GI ENDOSCOPY  Patient Location: PACU  Anesthesia Type:General  Level of Consciousness: awake, alert  and patient cooperative  Airway & Oxygen Therapy: Patient Spontanous Breathing and Patient connected to face mask oxygen  Post-op Assessment: Report given to RN and Post -op Vital signs reviewed and stable  Post vital signs: Reviewed and stable  Last Vitals:  Vitals:   03/12/17 0535 03/12/17 1009  BP: 134/74 (P) 135/72  Pulse: 67 76  Resp: 16 (P) 16  Temp: 37 C     Last Pain:  Vitals:   03/12/17 0535  TempSrc: Oral         Complications: No apparent anesthesia complications

## 2017-03-12 NOTE — Progress Notes (Signed)
Pressure bandage checked no additional bleeding noted since applied.

## 2017-03-12 NOTE — Discharge Instructions (Signed)

## 2017-03-12 NOTE — Progress Notes (Signed)
Patient complains of pain, asked to stand by bed.  Patient stood by bed took several steps then felt dizzy.  Noticed blood on floor.  Patient states on cycle.  Feminine products provided, patient right port noticed to be bleeding.  Pressure dressing applied to right port site.  Bedside RN Thurmond Butts made aware and will continue to check incision for further bleeding. Discussed above with patient family, questions answered

## 2017-03-12 NOTE — Anesthesia Postprocedure Evaluation (Addendum)
Anesthesia Post Note  Patient: [name]  Procedure(s) Performed: Procedure(s) (LRB): LAPAROSCOPIC ROUX-EN-Y GASTRIC BYPASS WITH UPPER ENDOSCOPY (N/A) UPPER GI ENDOSCOPY  Patient location during evaluation: PACU Anesthesia Type: General Level of consciousness: awake, awake and alert and oriented Pain management: pain level controlled Vital Signs Assessment: post-procedure vital signs reviewed and stable Respiratory status: spontaneous breathing, nonlabored ventilation and respiratory function stable Cardiovascular status: blood pressure returned to baseline Anesthetic complications: no       Last Vitals:  Vitals:   03/12/17 1230 03/12/17 1330  BP: 125/83 122/71  Pulse: 76 81  Resp: 17 18  Temp: 36.4 C 36.4 C    Last Pain:  Vitals:   03/12/17 1330  TempSrc: Oral  PainSc:                  [name]

## 2017-03-12 NOTE — Progress Notes (Signed)
RT placed patient on cpap machine. Patient setting is 5-20 cmH2O. 3 liter oxygen bleed into tubing. Sterile water added to water chamber for humidification. Patient is tolerating well .

## 2017-03-12 NOTE — Op Note (Addendum)
Preop Diagnosis: Obesity Class III  Postop Diagnosis: same  Procedure performed: laparoscopic Roux en Y gastric bypass  Assitant: Greer Pickerel  Indications:  The patient is a 41 y.o. year-old morbidly obese female who has been followed in the Bariatric Clinic as an outpatient. This patient was diagnosed with morbid obesity with a BMI of Body mass index is 49.57 kg/m. and significant co-morbidities including hypertension and non-insulin dependent diabetes.  The patient was counseled extensively in the Bariatric Outpatient Clinic and after a thorough explanation of the risks and benefits of surgery (including death from complications, bowel leak, infection such as peritonitis and/or sepsis, internal hernia, bleeding, need for blood transfusion, bowel obstruction, organ failure, pulmonary embolus, deep venous thrombosis, wound infection, incisional hernia, skin breakdown, and others entailed on the consent form) and after a compliant diet and exercise program, the patient was scheduled for an elective laparoscopic sleeve gastrectomy.  Description of Operation:  Following informed consent, the patient was taken to the operating room and placed on the operating table in the supine position.  She had previously received prophylactic antibiotics and subcutaneous heparin for DVT prophylaxis in the pre-op holding area.  After induction of general endotracheal anesthesia by the anesthesiologist, the patient underwent placement of sequential compression devices, Foley catheter and an oro-gastric tube.  A timeout was confirmed by the surgery and anesthesia teams.  The patient was adequately padded at all pressure points and placed on a footboard to prevent slippage from the OR table during extremes of position during surgery.  She underwent a routine sterile prep and drape of her entire abdomen.    Next, A transverse incision was made under the left subcostal area and a 67mm optical viewing trocar was introduced  into the peritoneal cavity. Pneumoperitoneum was applied with a high flow and low pressure. A laparoscope was inserted to confirm placement. A extraperitoneal block was then placed at the lateral abdominal wall using exparel diluted with marcaine . 5 additional trocars were placed: 1 63mm trocar to the left of the midline. 1 additional 55mm trocar in the left lateral area, 1 30mm trocar in the right mid abdomen, and 1 41mm trocar in the right subcostal area.  The greater omentum was flipped over the transverse colon and under the left lobe of the liver. The ligament of trietz was identified. 30cm of jejunum was measured starting from the ligament of Trietz. The mesentery was checked to ensure mobility. Next, a 2mm 2-48mm tristapler was used to divide the jejunum at this location. The harmonic scalpel was used to divide the mesentery down to the origin. A 1/2" penrose was sutured to the distal side. 100cm of jejunum was measured starting at the division. 2-0 silk was used to appose the biliary limb to the 100cm mark of jejunum in 2 places. Enterotomies were made in the biliary and common channels and a 70mm 2-3 tristapler was used to create the J-J anastomosis. A 2-0 silk was used to appose the enterotomy edges and a 637mm 2-3 tristapler was used to close the enterotomy. An anti-obstruction 2-0 silk suture was placed. Next, the mesenteric defect was closed with a 2-0 silk in running fashion.The J-J appeared patent and in neutral position.  Next, the omentum was divided using the Harmonic scalpel. The patient was placed in steep Reverse Trendelenberg position. A Nathanson retracted was placed through a subxiphoid incision and used to retract the liver. The liver was large and thin with some fatty changes. The fat pad over the fundus was  incised to free the fundus. Next, a position along the lesser curve 6cm from GE junction was identified. The pars flaccida was entered and the fat over the lesser curve divided to  enter the lesser sac. Multiple 7mm 3-35mm tristaple firings were peformed to create a 6cm pouch. The Roux limb was identified using the placed penrose and brought up to the stomach in antecolic fashion. The limb was inspected to ensure a neutral position. A 2-0 vicryl suture was then used to create a posterior layer connecting the stomach to the Roux limb jejunum in running fashion. Next cautery was used to create an enterotomy along the medial aspect of this suture line and Harmonic scalpel used to create gastotomy. A 23mm 3-68mm tristapler was then used to create a 25-83mm anastomosis. 2 2-0 vicryl sutures were used in running fashion to close the gastrotomy. Finally, a 2-0 vicryl suture was used to close an anterior layer of stomach and jejunum over the anastomosis in running fashion. The penrose was removed from the Roux limb.  The assistant then went and performed an upper endoscopy and leak test. No bubbles were seen and the pouch and limb distended appropriately. The limb and pouch were deflated, the endoscope was removed. Hemostasis was ensured. Pneumoperitoneum was evacuated, all ports were removed and all incisions closed with 4-0 monocryl suture in subcuticular fashion. Glue was put in place for dressing. The patient awoke from anesthesia and was brought to pacu in stable condition. All counts were correct.  Findings:     Specimens:  None  Local Anesthesia: 50 ml Exparel: 0.5% Marcaine Mix  Post-Op Plan:       Pain Management: PO, prn      Antibiotics: Prophylactic      Anticoagulation: Prophylactic, Starting now      Post Op Studies/Consults: Not applicable      Intended Discharge: within 48h      Intended Outpatient Follow-Up: Two Week      Intended Outpatient Studies: Not Applicable      Other: Not Applicable   [name] [name]

## 2017-03-13 ENCOUNTER — Telehealth: Payer: Self-pay

## 2017-03-13 ENCOUNTER — Encounter: Payer: Self-pay | Admitting: Gynecology

## 2017-03-13 LAB — COMPREHENSIVE METABOLIC PANEL
ALBUMIN: 3.4 g/dL — AB (ref 3.5–5.0)
ALT: 84 U/L — ABNORMAL HIGH (ref 14–54)
ANION GAP: 11 (ref 5–15)
AST: 57 U/L — AB (ref 15–41)
Alkaline Phosphatase: 48 U/L (ref 38–126)
BUN: 21 mg/dL — AB (ref 6–20)
CHLORIDE: 103 mmol/L (ref 101–111)
CO2: 23 mmol/L (ref 22–32)
Calcium: 8.3 mg/dL — ABNORMAL LOW (ref 8.9–10.3)
Creatinine, Ser: 0.83 mg/dL (ref 0.44–1.00)
GFR calc Af Amer: >60 mL/min (ref >60)
GFR calc non Af Amer: >60 mL/min (ref >60)
GLUCOSE: 181 mg/dL — AB (ref 65–99)
POTASSIUM: 3.9 mmol/L (ref 3.5–5.1)
SODIUM: 137 mmol/L (ref 135–145)
Total Bilirubin: 0.6 mg/dL (ref 0.3–1.2)
Total Protein: 6.6 g/dL (ref 6.5–8.1)

## 2017-03-13 LAB — CBC WITH DIFFERENTIAL/PLATELET
BASOS ABS: 0 10*3/uL (ref 0.0–0.1)
BASOS PCT: 0 %
EOS ABS: 0 10*3/uL (ref 0.0–0.7)
Eosinophils Relative: 0 %
HEMATOCRIT: 31.7 % — AB (ref 36.0–46.0)
Hemoglobin: 10.4 g/dL — ABNORMAL LOW (ref 12.0–15.0)
Lymphocytes Relative: 13 %
Lymphs Abs: 1.6 10*3/uL (ref 0.7–4.0)
MCH: 29.2 pg (ref 26.0–34.0)
MCHC: 32.8 g/dL (ref 30.0–36.0)
MCV: 89 fL (ref 78.0–100.0)
MONO ABS: 1.2 10*3/uL — AB (ref 0.1–1.0)
MONOS PCT: 10 %
NEUTROS ABS: 9.8 10*3/uL — AB (ref 1.7–7.7)
NEUTROS PCT: 77 %
Platelets: 365 10*3/uL (ref 150–400)
RBC: 3.56 MIL/uL — ABNORMAL LOW (ref 3.87–5.11)
RDW: 14.2 % (ref 11.5–15.5)
WBC: 12.6 10*3/uL — ABNORMAL HIGH (ref 4.0–10.5)

## 2017-03-13 LAB — HEMOGLOBIN AND HEMATOCRIT, BLOOD
HEMATOCRIT: 29 % — AB (ref 36.0–46.0)
HEMOGLOBIN: 9.5 g/dL — AB (ref 12.0–15.0)

## 2017-03-13 LAB — GLUCOSE, CAPILLARY
GLUCOSE-CAPILLARY: 172 mg/dL — AB (ref 65–99)
GLUCOSE-CAPILLARY: 132 mg/dL — AB (ref 65–99)
Glucose-Capillary: 128 mg/dL — ABNORMAL HIGH (ref 65–99)

## 2017-03-13 MED ORDER — INSULIN ASPART 100 UNIT/ML ~~LOC~~ SOLN
0.0000 [IU] | Freq: Three times a day (TID) | SUBCUTANEOUS | Status: DC
  Administered 2017-03-13: 3 [IU] via SUBCUTANEOUS
  Administered 2017-03-13: 4 [IU] via SUBCUTANEOUS
  Administered 2017-03-14: 3 [IU] via SUBCUTANEOUS

## 2017-03-13 MED ORDER — LEVOTHYROXINE SODIUM 100 MCG PO TABS
100.0000 ug | ORAL_TABLET | Freq: Every day | ORAL | Status: DC
  Administered 2017-03-13: 100 ug via ORAL
  Filled 2017-03-13: qty 1

## 2017-03-13 MED ORDER — METFORMIN HCL 500 MG PO TABS
1000.0000 mg | ORAL_TABLET | Freq: Two times a day (BID) | ORAL | Status: DC
  Administered 2017-03-13 (×2): 1000 mg via ORAL
  Filled 2017-03-13 (×2): qty 2

## 2017-03-13 NOTE — Progress Notes (Addendum)
Dr Kieth Brightly made aware of H&H results.    Patient denies any increased pain or dizziness, states pain is controlled with oral pain medications. Vital signs stable, 400 cc of urine last 8 hours.  No new orders at this time. Will continue to monitor

## 2017-03-13 NOTE — Telephone Encounter (Signed)
Received fax from [facility] for patient's onglyza 5mg . Completed sections I could, and then sent it to Dr. Tamala Julian to complete.  I will be happy to fax back when completed.

## 2017-03-13 NOTE — Progress Notes (Addendum)
Patient alert and oriented, Post op day 1.  Provided support and encouragement.  Encouraged pulmonary toilet, and ambulation.  Patient completed 12 ounces of fluid overnight. Started protein this am, has completed 4 ounces.  States pain from menstrual cycle was unchanged with medication, however helped upper abdominal pain.  Passing flatus.   All questions answered.  Will continue to monitor.

## 2017-03-13 NOTE — Progress Notes (Signed)
Progress Note: Metabolic and Bariatric Surgery Service   Chief Complaint/Subjective: Some nausea with ambulation, moderate abdominal pain  Objective: Vital signs in last 24 hours: Temp:  [97.4 F (36.3 C)-98.6 F (37 C)] 98.6 F (37 C) (05/16 0505) Pulse Rate:  [73-83] 83 (05/16 0505) Resp:  [5-22] 18 (05/16 0505) BP: (102-154)/(53-83) 129/67 (05/16 0505) SpO2:  [94 %-100 %] 96 % (05/16 0505) Weight:  [125.1 kg (275 lb 12.8 oz)] 125.1 kg (275 lb 12.8 oz) (05/16 0529) Last BM Date: 03/11/17  Intake/Output from previous day: 05/15 0701 - 05/16 0700 In: 2170 [P.O.:420; I.V.:1750] Out: 1150 [Urine:1100; Blood:50] Intake/Output this shift: No intake/output data recorded.  Lungs: CTAB  Cardiovascular: RRR  Abd: soft, ATTP, ND, right mid incision with dried blood  Extremities: no edema  Neuro: AOx4  Lab Results: CBC   Recent Labs  03/12/17 1150 03/13/17 0456  WBC 15.0* 12.6*  HGB 14.0 10.4*  HCT 43.2 31.7*  PLT 354 365   BMET  Recent Labs  03/12/17 1150 03/13/17 0456  NA  --  137  K  --  3.9  CL  --  103  CO2  --  23  GLUCOSE  --  181*  BUN  --  21*  CREATININE 1.00 0.83  CALCIUM  --  8.3*   PT/INR No results for input(s): LABPROT, INR in the last 72 hours. ABG No results for input(s): PHART, HCO3 in the last 72 hours.  Invalid input(s): PCO2, PO2  Studies/Results:  Anti-infectives: Anti-infectives    Start     Dose/Rate Route Frequency Ordered Stop   03/12/17 0634  cefoTEtan in Dextrose 5% (CEFOTAN) 2-2.08 GM-% IVPB    Comments:  Dione Booze   : cabinet override      03/12/17 0634 03/12/17 1844   03/12/17 0614  cefoTEtan in Dextrose 5% (CEFOTAN) IVPB 2 g     2 g Intravenous On call to O.R. 03/12/17 6599 03/12/17 0756      Medications: Scheduled Meds: . enoxaparin (LOVENOX) injection  30 mg Subcutaneous Q12H  . insulin aspart  0-20 Units Subcutaneous TID WC  . levothyroxine  100 mcg Oral QAC breakfast  . metFORMIN  1,000 mg Oral  BID WC  . pantoprazole (PROTONIX) IV  40 mg Intravenous QHS  . [START ON 03/14/2017] protein supplement shake  2 oz Oral Q2H  . scopolamine  1 patch Transdermal On Call to OR   Continuous Infusions: . sodium chloride 1,000 mL (03/13/17 0646)   PRN Meds:.oxyCODONE **AND** acetaminophen, acetaminophen, morphine injection, ondansetron (ZOFRAN) IV  Assessment/Plan: Patient Active Problem List   Diagnosis Date Noted  . Morbid obesity ([place]) 03/12/2017  . Hypothyroidism 09/11/2016  . Essential hypertension, benign 06/29/2015  . Allergic rhinitis due to pollen 06/29/2015  . Anxiety and depression 06/29/2015  . Gastroesophageal reflux disease without esophagitis 06/29/2015  . OSA on CPAP 06/29/2015  . History of syphilis 05/23/2015  . Amenorrhea 05/23/2015  . Severe obesity (BMI >= 40) ([place]) 09/14/2014  . Irregular menstrual cycle 11/12/2013  . Type II diabetes mellitus, uncontrolled ([place]) 06/20/2009  . POLYCYSTIC OVARIAN DISEASE 06/20/2009  . CARPAL TUNNEL SYNDROME 06/20/2009   s/p Procedure(s): LAPAROSCOPIC ROUX-EN-Y GASTRIC BYPASS WITH UPPER ENDOSCOPY UPPER GI ENDOSCOPY 03/12/2017 -restart home meds -SSI today -if pain improves may be ready to go home this afternoon -recheck H+H in afternon  Disposition:  LOS: 1 day  The patient will be in the hospital for normal postop protocol  Mickeal Skinner, MD [phone] [facility]

## 2017-03-14 ENCOUNTER — Inpatient Hospital Stay (HOSPITAL_COMMUNITY): Payer: BLUE CROSS/BLUE SHIELD

## 2017-03-14 LAB — CBC WITH DIFFERENTIAL/PLATELET
BASOS ABS: 0 10*3/uL (ref 0.0–0.1)
BASOS PCT: 0 %
EOS ABS: 0 10*3/uL (ref 0.0–0.7)
Eosinophils Relative: 0 %
HCT: 26.9 % — ABNORMAL LOW (ref 36.0–46.0)
HEMOGLOBIN: 8.6 g/dL — AB (ref 12.0–15.0)
Lymphocytes Relative: 23 %
Lymphs Abs: 2.2 10*3/uL (ref 0.7–4.0)
MCH: 29 pg (ref 26.0–34.0)
MCHC: 32 g/dL (ref 30.0–36.0)
MCV: 90.6 fL (ref 78.0–100.0)
MONOS PCT: 10 %
Monocytes Absolute: 0.9 10*3/uL (ref 0.1–1.0)
NEUTROS PCT: 67 %
Neutro Abs: 6.5 10*3/uL (ref 1.7–7.7)
Platelets: 282 10*3/uL (ref 150–400)
RBC: 2.97 MIL/uL — ABNORMAL LOW (ref 3.87–5.11)
RDW: 14.4 % (ref 11.5–15.5)
WBC: 9.7 10*3/uL (ref 4.0–10.5)

## 2017-03-14 LAB — HEMOGLOBIN AND HEMATOCRIT, BLOOD
HEMATOCRIT: 28.8 % — AB (ref 36.0–46.0)
HEMOGLOBIN: 9.1 g/dL — AB (ref 12.0–15.0)

## 2017-03-14 MED ORDER — IOPAMIDOL (ISOVUE-300) INJECTION 61%
100.0000 mL | Freq: Once | INTRAVENOUS | Status: AC | PRN
  Administered 2017-03-14: 100 mL via INTRAVENOUS

## 2017-03-14 MED ORDER — IOPAMIDOL (ISOVUE-300) INJECTION 61%
INTRAVENOUS | Status: AC
  Filled 2017-03-14: qty 100

## 2017-03-14 MED ORDER — SODIUM CHLORIDE 0.9% FLUSH: 3.0000 mL | INTRAVENOUS | Status: DC | PRN

## 2017-03-14 MED ORDER — SODIUM CHLORIDE 0.9 % IV SOLN: 250.0000 mL | INTRAVENOUS | Status: DC | PRN

## 2017-03-14 MED ORDER — IOPAMIDOL (ISOVUE-300) INJECTION 61%
INTRAVENOUS | Status: AC
  Administered 2017-03-14: 30 mL
  Filled 2017-03-14: qty 30

## 2017-03-14 MED ORDER — FERROUS SULFATE 300 (60 FE) MG/5ML PO SYRP: 300.0000 mg | ORAL_SOLUTION | Freq: Every day | ORAL | 3 refills | Status: DC

## 2017-03-14 MED ORDER — IOPAMIDOL (ISOVUE-300) INJECTION 61%: 30.0000 mL | Freq: Once | INTRAVENOUS | Status: DC

## 2017-03-14 MED ORDER — SODIUM CHLORIDE 0.9% FLUSH: 3.0000 mL | Freq: Two times a day (BID) | INTRAVENOUS | Status: DC

## 2017-03-14 MED ORDER — PANTOPRAZOLE SODIUM 40 MG PO TBEC: 40.0000 mg | DELAYED_RELEASE_TABLET | Freq: Every day | ORAL | 1 refills | Status: DC

## 2017-03-14 NOTE — Progress Notes (Signed)
Discharge instructions discussed with patient and family verbalized agreeement and understanding

## 2017-03-14 NOTE — Progress Notes (Addendum)
Progress Note: Metabolic and Bariatric Surgery Service   Chief Complaint/Subjective: No complaints, tolerated 8oz shake yesterday, period is lighter  Objective: Vital signs in last 24 hours: Temp:  [97.9 F (36.6 C)-98.5 F (36.9 C)] 98.4 F (36.9 C) (05/17 0525) Pulse Rate:  [65-78] 78 (05/17 0525) Resp:  [16-18] 16 (05/17 0525) BP: (109-147)/(41-65) 112/60 (05/17 0525) SpO2:  [97 %-100 %] 100 % (05/17 0525) Weight:  [130.1 kg (286 lb 13.1 oz)] 130.1 kg (286 lb 13.1 oz) (05/17 0525) Last BM Date: 03/11/17  Intake/Output from previous day: 05/16 0701 - 05/17 0700 In: 1620 [P.O.:420; I.V.:1200] Out: 400 [Urine:400] Intake/Output this shift: No intake/output data recorded.  Lungs: CTAB  Cardiovascular: RRR  Abd: soft, ecchymosis over right incision  Extremities: no edema  Neuro: AOx4  Lab Results: CBC   Recent Labs  03/13/17 0456 03/13/17 1340 03/14/17 0440  WBC 12.6*  --  9.7  HGB 10.4* 9.5* 8.6*  HCT 31.7* 29.0* 26.9*  PLT 365  --  282   BMET  Recent Labs  03/12/17 1150 03/13/17 0456  NA  --  137  K  --  3.9  CL  --  103  CO2  --  23  GLUCOSE  --  181*  BUN  --  21*  CREATININE 1.00 0.83  CALCIUM  --  8.3*   PT/INR No results for input(s): LABPROT, INR in the last 72 hours. ABG No results for input(s): PHART, HCO3 in the last 72 hours.  Invalid input(s): PCO2, PO2  Studies/Results:  Anti-infectives: Anti-infectives    Start     Dose/Rate Route Frequency Ordered Stop   03/12/17 0634  cefoTEtan in Dextrose 5% (CEFOTAN) 2-2.08 GM-% IVPB    Comments:  Dione Booze   : cabinet override      03/12/17 0634 03/12/17 1844   03/12/17 0614  cefoTEtan in Dextrose 5% (CEFOTAN) IVPB 2 g     2 g Intravenous On call to O.R. 03/12/17 6283 03/12/17 0756      Medications: Scheduled Meds: . enoxaparin (LOVENOX) injection  30 mg Subcutaneous Q12H  . insulin aspart  0-20 Units Subcutaneous TID WC  . levothyroxine  100 mcg Oral QAC breakfast  .  metFORMIN  1,000 mg Oral BID WC  . pantoprazole (PROTONIX) IV  40 mg Intravenous QHS  . protein supplement shake  2 oz Oral Q2H   Continuous Infusions: . sodium chloride 100 mL/hr at 03/14/17 0548   PRN Meds:.oxyCODONE **AND** acetaminophen, acetaminophen, morphine injection, ondansetron (ZOFRAN) IV  Assessment/Plan: Patient Active Problem List   Diagnosis Date Noted  . Morbid obesity ([place]) 03/12/2017  . Hypothyroidism 09/11/2016  . Essential hypertension, benign 06/29/2015  . Allergic rhinitis due to pollen 06/29/2015  . Anxiety and depression 06/29/2015  . Gastroesophageal reflux disease without esophagitis 06/29/2015  . OSA on CPAP 06/29/2015  . History of syphilis 05/23/2015  . Amenorrhea 05/23/2015  . Severe obesity (BMI >= 40) ([place]) 09/14/2014  . Irregular menstrual cycle 11/12/2013  . Type II diabetes mellitus, uncontrolled ([place]) 06/20/2009  . POLYCYSTIC OVARIAN DISEASE 06/20/2009  . CARPAL TUNNEL SYNDROME 06/20/2009   s/p Procedure(s): LAPAROSCOPIC ROUX-EN-Y GASTRIC BYPASS WITH UPPER ENDOSCOPY UPPER GI ENDOSCOPY 03/12/2017 -Hemoglobin continues to decrease, ecchymosis seen at right incision and she has had a period over the last 3 days which is now lightening, however, do to the level of drop a ct scan is warranted to look for hematoma or worse, active extravasation -water only until after CT -continue to ambulate -given  lack of tachycardia or lightheadedness, she continues to look clinical well -stop lovenox  Disposition:  LOS: 2 days  The patient will be in the hospital for normal postop protocol  Mickeal Skinner, MD [phone] [facility]

## 2017-03-14 NOTE — Progress Notes (Signed)
Patient alert and oriented, Post op day 2.  Provided support and encouragement.  Encouraged pulmonary toilet, ambulation and small sips of clear liquids.  Patient to have CT abdomen due to decrease in hemoglobin.  Discussed with patients.  All questions answered.  Will continue to monitor.

## 2017-03-14 NOTE — Discharge Summary (Signed)
Physician Discharge Summary  [name] VOZ:[id number] DOB: 1975/11/20 DOA: 03/12/2017  PCP: Wardell Honour, MD  Admit date: 03/12/2017 Discharge date: 03/14/2017  Recommendations for Outpatient Follow-up:  1.  (include homehealth, outpatient follow-up instructions, specific recommendations for PCP to follow-up on, etc.)  Follow-up Information    Kinsinger, Arta Bruce, MD. Go on 03/29/2017.   Specialty:  General Surgery Why:  at [employer] information: [place] [address] [phone]        Kinsinger, Arta Bruce, MD Follow up.   Specialty:  General Surgery Contact information: [place] [address] [phone]          Discharge Diagnoses:  Active Problems:   Morbid obesity [place])   Surgical Procedure: Laparoscopic Sleeve Gastrectomy, upper endoscopy  Discharge Condition: Good Disposition: Home  Diet recommendation: Postoperative sleeve gastrectomy diet (liquids only)  Filed Weights   03/12/17 0535 03/13/17 0529 03/14/17 0525  Weight: 122.9 kg (271 lb) 125.1 kg (275 lb 12.8 oz) 130.1 kg (286 lb 13.1 oz)     Hospital Course:  The patient was admitted after undergoing Roux-en-Y gastric bypass. POD 0 she ambulated well. POD 1 she was started on the water diet protocol and tolerated >420ml ml in the first shift. She had a substantial drop in hemoglobin and was kept overnight to recheck. It still was lower and a Ct scan was obtained showing only subcutaneous fluid. Once meeting the water amount she was advanced to bariatric protein shakes which they tolerated and were discharged home POD 2.  Treatments: surgery: Roux-en-Y gastric bypass  Discharge Instructions  Discharge Instructions    Ambulate hourly while awake    Complete by:  As directed    Call MD for:  difficulty breathing, headache or visual disturbances    Complete by:  As directed    Call MD for:  persistant dizziness or light-headedness     Complete by:  As directed    Call MD for:  persistant nausea and vomiting    Complete by:  As directed    Call MD for:  redness, tenderness, or signs of infection (pain, swelling, redness, odor or green/yellow discharge around incision site)    Complete by:  As directed    Call MD for:  severe uncontrolled pain    Complete by:  As directed    Call MD for:  temperature >101 F    Complete by:  As directed    Diet bariatric full liquid    Complete by:  As directed    Discharge wound care:    Complete by:  As directed    Remove Bandaids tomorrow, ok to shower tomorrow. Steristrips may fall off in 1-3 weeks.   Incentive spirometry    Complete by:  As directed    Perform hourly while awake     Allergies as of 03/14/2017      Reactions   Food    RAW/FRESH FRUIT--PEACHES, PEARS, APPLES=THROAT CLOSES/SWELLING & COUGHING SHORTNESS OF BREATH   Peach [prunus Persica] Anaphylaxis   Shrimp [shellfish Allergy] Anaphylaxis      Medication List    TAKE these medications   canagliflozin 100 MG Tabs tablet Commonly known as:  INVOKANA Take 1 tablet (100 mg total) by mouth daily. Notes to patient:  Monitor Blood Sugar Frequently and keep a log for primary care physician, you may need to adjust medication dosage with rapid weight loss.     ferrous  sulfate 300 (60 Fe) MG/5ML syrup Take 5 mLs (300 mg total) by mouth daily.   glucose blood test strip Commonly known as:  FREESTYLE TEST STRIPS Please fill with preferred strip and check sugar once daily   hydrochlorothiazide 25 MG tablet Commonly known as:  HYDRODIURIL TAKE ONE TABLET BY MOUTH ONCE DAILY Notes to patient:  Monitor Blood Pressure Daily and keep a log for primary care physician.  Monitor for symptoms of dehydration.  You may need to make changes to your medications with rapid weight loss.     levothyroxine 100 MCG tablet Commonly known as:  SYNTHROID, LEVOTHROID Take 1 tablet (100 mcg total) by mouth daily.   loratadine 10 MG  tablet Commonly known as:  CLARITIN Take 1 tablet (10 mg total) by mouth daily.   metFORMIN 500 MG tablet Commonly known as:  GLUCOPHAGE Take 2 tabs by mouth (1000 mg ) 2 times daily. Notes to patient:  Monitor Blood Sugar Frequently and keep a log for primary care physician, you may need to adjust medication dosage with rapid weight loss.     nystatin powder Generic drug:  nystatin Apply 500,000 g topically 2 (two) times daily. What changed:  when to take this  additional instructions   nystatin cream Commonly known as:  MYCOSTATIN Apply 1 application topically 2 (two) times daily. What changed:  when to take this  reasons to take this   Olopatadine HCl 0.2 % Soln Apply 1 drop to eye daily. What changed:  when to take this  reasons to take this   pantoprazole 40 MG tablet Commonly known as:  PROTONIX Take 1 tablet (40 mg total) by mouth daily.      Follow-up Information    Kinsinger, Arta Bruce, MD. Go on 03/29/2017.   Specialty:  General Surgery Why:  at [employer] information: [place] [address] [phone]        Kinsinger, Arta Bruce, MD Follow up.   Specialty:  General Surgery Contact information: [place] [address] [phone]            The results of significant diagnostics from this hospitalization (including imaging, microbiology, ancillary and laboratory) are listed below for reference.    Significant Diagnostic Studies: Ct Abdomen Pelvis W Contrast  Result Date: 03/14/2017 CLINICAL DATA:  Anemia.  Prior gastric bypass EXAM: CT ABDOMEN AND PELVIS WITH CONTRAST TECHNIQUE: Multidetector CT imaging of the abdomen and pelvis was performed using the standard protocol following bolus administration of intravenous contrast. CONTRAST:  152mL ISOVUE-300 IOPAMIDOL (ISOVUE-300) INJECTION 61%, <See Chart> ISOVUE-300 IOPAMIDOL (ISOVUE-300) INJECTION 61% COMPARISON:  None. FINDINGS: Lower  chest: Atelectasis and/or infiltrates noted in the lower lobes bilaterally. Trace left pleural effusion. Heart is normal size. Hepatobiliary: Mild low-density throughout the liver suggesting fatty infiltration. Gallbladder unremarkable. Pancreas: No focal abnormality or ductal dilatation. Spleen: No focal abnormality.  Normal size. Adrenals/Urinary Tract: No adrenal abnormality. No focal renal abnormality. No stones or hydronephrosis. Urinary bladder is unremarkable. Stomach/Bowel: Appendix is normal. Postoperative changes in the region of the stomach from gastric bypass. Small bowel and colon unremarkable. Vascular/Lymphatic: No evidence of aneurysm or adenopathy. Reproductive: Uterus and adnexa unremarkable. Other: A small amount of free fluid in the pelvis and right lower quadrant. No free air. No evidence of retroperitoneal hematoma. There is stranding throughout the subcutaneous soft tissues of the anterior abdominal wall. There is abnormal soft tissue noted in the left upper quadrant adjacent to the  proximal stomach, likely hematoma related to recent gastric surgery. This is somewhat infiltrating in appearance in the left upper abdomen and difficult to measure. Musculoskeletal: No acute bony abnormality. IMPRESSION: There is abnormal high density soft tissue noted in the left upper quadrant adjacent to the stomach, likely postoperative hematoma. Small amount of free fluid in the pelvis and right lower quadrant. Electronically Signed   By: Rolm Baptise M.D.   On: 03/14/2017 13:12    Labs: Basic Metabolic Panel:  Recent Labs Lab 03/12/17 1150 03/13/17 0456  NA  --  137  K  --  3.9  CL  --  103  CO2  --  23  GLUCOSE  --  181*  BUN  --  21*  CREATININE 1.00 0.83  CALCIUM  --  8.3*   Liver Function Tests:  Recent Labs Lab 03/13/17 0456  AST 57*  ALT 84*  ALKPHOS 48  BILITOT 0.6  PROT 6.6  ALBUMIN 3.4*    CBC:  Recent Labs Lab 03/12/17 1150 03/13/17 0456 03/13/17 1340  03/14/17 0440 03/14/17 1321  WBC 15.0* 12.6*  --  9.7  --   NEUTROABS  --  9.8*  --  6.5  --   HGB 14.0 10.4* 9.5* 8.6* 9.1*  HCT 43.2 31.7* 29.0* 26.9* 28.8*  MCV 89.4 89.0  --  90.6  --   PLT 354 365  --  282  --     CBG:  Recent Labs Lab 03/12/17 0551 03/12/17 1014 03/13/17 1133 03/13/17 1707 03/13/17 2138  GLUCAP 99 202* 172* 132* 128*    Active Problems:   Morbid obesity ([place])   Time coordinating discharge: <23min

## 2017-03-14 NOTE — Progress Notes (Signed)
Patient alert and oriented, pain is controlled. Patient is tolerating fluids, advanced to protein shake today, patient is tolerating well.  Reviewed Gastric sleeve discharge instructions with patient and patient is able to articulate understanding.  Provided information on BELT program, Support Group and [facility] outpatient pharmacy. All questions answered, will continue to monitor.  

## 2017-03-21 ENCOUNTER — Telehealth (HOSPITAL_COMMUNITY): Payer: Self-pay

## 2017-03-21 NOTE — Telephone Encounter (Signed)
Made discharge phone call to patient  Asking the following questions.    1. Do you have someone to care for you now that you are home?  yes 2. Are you having pain now that is not relieved by your pain medication?  Doesn't need 3. Are you able to drink the recommended daily amount of fluids (48 ounces minimum/day) and protein (60-80 grams/day) as prescribed by the dietitian or nutritional counselor?  At least 48 ounces 60 ounces of protein 4. Are you taking the vitamins and minerals as prescribed?  Yes has not picked up extra iron ordered 5. Do you have the "on call" number to contact your surgeon if you have a problem or question?  yes 6. Are your incisions free of redness, swelling or drainage? (If steri strips, address that these can fall off, shower as tolerated) yes 7. Have your bowels moved since your surgery?  If not, are you passing gas?  yes 8. Are you up and walking 3-4 times per day?  yes 9. Were you provided your discharge medications before your surgery or before you were discharged from the hospital and are you taking them without problem?  yes

## 2017-03-26 ENCOUNTER — Encounter: Payer: BLUE CROSS/BLUE SHIELD | Attending: General Surgery | Admitting: Skilled Nursing Facility1

## 2017-03-26 DIAGNOSIS — Z713 Dietary counseling and surveillance: Secondary | ICD-10-CM | POA: Diagnosis not present

## 2017-03-26 DIAGNOSIS — E1165 Type 2 diabetes mellitus with hyperglycemia: Secondary | ICD-10-CM

## 2017-03-26 DIAGNOSIS — IMO0001 Reserved for inherently not codable concepts without codable children: Secondary | ICD-10-CM

## 2017-03-27 ENCOUNTER — Encounter: Payer: Self-pay | Admitting: Skilled Nursing Facility1

## 2017-03-27 NOTE — Progress Notes (Addendum)
Bariatric Class:  Appt start time: 1530 end time:  1630.  2 Week Post-Operative Nutrition Class  Patient was seen on 03/27/2017 for Post-Operative Nutrition [facility].   Surgery date: 03/12/2017 Surgery type: RYGB Start weight at [facility]: 282 Weight today: 256.6  TANITA  BODY COMP RESULTS  03/27/2017   BMI (kg/m^2) 46.9   Fat Mass (lbs) 129.6   Fat Free Mass (lbs) 127   Total Body Water (lbs) 93.8   The following the learning objectives were met by the patient during this course:  Identifies Phase 3A (Soft, High Proteins) Dietary Goals and will begin from 2 weeks post-operatively to 2 months post-operatively  Identifies appropriate sources of fluids and proteins   States protein recommendations and appropriate sources post-operatively  Identifies the need for appropriate texture modifications, mastication, and bite sizes when consuming solids  Identifies appropriate multivitamin and calcium sources post-operatively  Describes the need for physical activity post-operatively and will follow MD recommendations  States when to call healthcare provider regarding medication questions or post-operative complications  Handouts given during class include:  Phase 3A: Soft, High Protein Diet Handout  Follow-Up Plan: Patient will follow-up at [facility] in 6 weeks for 2 month post-op nutrition visit for diet advancement per MD.

## 2017-04-03 ENCOUNTER — Encounter: Payer: Self-pay | Admitting: Family Medicine

## 2017-04-03 ENCOUNTER — Ambulatory Visit (INDEPENDENT_AMBULATORY_CARE_PROVIDER_SITE_OTHER): Payer: Managed Care, Other (non HMO) | Admitting: Family Medicine

## 2017-04-03 VITALS — BP 103/70 | HR 81 | Temp 98.0°F | Resp 18 | Ht 62.21 in | Wt 251.0 lb

## 2017-04-03 DIAGNOSIS — K602 Anal fissure, unspecified: Secondary | ICD-10-CM | POA: Diagnosis not present

## 2017-04-03 DIAGNOSIS — D62 Acute posthemorrhagic anemia: Secondary | ICD-10-CM | POA: Diagnosis not present

## 2017-04-03 DIAGNOSIS — K625 Hemorrhage of anus and rectum: Secondary | ICD-10-CM

## 2017-04-03 DIAGNOSIS — K5901 Slow transit constipation: Secondary | ICD-10-CM

## 2017-04-03 DIAGNOSIS — I10 Essential (primary) hypertension: Secondary | ICD-10-CM | POA: Diagnosis not present

## 2017-04-03 DIAGNOSIS — Z9884 Bariatric surgery status: Secondary | ICD-10-CM

## 2017-04-03 DIAGNOSIS — E119 Type 2 diabetes mellitus without complications: Secondary | ICD-10-CM

## 2017-04-03 LAB — POCT URINALYSIS DIP (MANUAL ENTRY)
Blood, UA: NEGATIVE
LEUKOCYTES UA: NEGATIVE
NITRITE UA: NEGATIVE
Protein Ur, POC: 30 mg/dL — AB
Spec Grav, UA: >=1.03 — AB (ref 1.010–1.025)
Urobilinogen, UA: 0.2 E.U./dL
pH, UA: 5.5 (ref 5.0–8.0)

## 2017-04-03 MED ORDER — HYDROCORTISONE ACETATE 25 MG RE SUPP: 25.0000 mg | Freq: Two times a day (BID) | RECTAL | 0 refills | Status: DC

## 2017-04-03 NOTE — Patient Instructions (Addendum)
  STOP INVOKANA. INCREASE METFORMIN 500MG  TWO AT 8:00AM AND TWO AT 6:00PM WITH SUPPER.  STOP HYDROCHLOROTHIAZIDE.  TAKE MILK OF MAGNESIUM EVERY OTHER DAY FOR CONSTIPATION.    IF you received an x-ray today, you will receive an invoice from [facility]. Please contact [facility] at [phone] with questions or concerns regarding your invoice.   IF you received labwork today, you will receive an invoice from [place]. Please contact [facility] at [phone] with questions or concerns regarding your invoice.   Our billing staff will not be able to assist you with questions regarding bills from these companies.  You will be contacted with the lab results as soon as they are available. The fastest way to get your results is to activate your My Chart account. Instructions are located on the last page of this paperwork. If you have not heard from [place] regarding the results in 2 weeks, please contact this office.

## 2017-04-03 NOTE — Progress Notes (Signed)
Subjective:    Patient ID: [name], female    DOB: 1976-01-13, 41 y.o.   MRN: [id number]  04/03/2017  Follow-up (DM)   HPI This 41 y.o. female presents for evaluation of gastric bypass, DMII, hypertension.  Start weight 289 pounds.  Current weight 251.  Went follow-up last week; pt requesting additional month due to heavy lifting requirements at work; pulling pallets at work a lot.  Saw nutritionist on 03/26/2017.  Drinking protein shakes; not very hungry.  This morning, ate egg with some juice.  If only eats egg, feels badly; must have juice in eggs.  At 12:30pm, eats 1 ounce of chicken, beans.   No vomiting yet nausea.  Waits thirty minutes and drinks water. Eating chicken soup; eating scrambled egg in chicken soup.  Constipated a lot.  Painful rectum; rectal bleeding.  Has rectal pain all day due to pain.  Has been taking MOM.  Follow-up with surgeon in three weeks.  Follow-up with nutritionist six weeks.    Admission:  03-12-2017 Discharge:  03-14-2017  Sugar has been low.  Fasting sugar today 96; mostly 116.  Only taking two Metformin.  Highest sugar 161 since surgery.  Checking sugar 2-3 times per dya.   Has been having low sugars.   Taking Metformin 1000mg  at 8:00am.   Taking HCTZ 25mg  at 8:00am with Metformin.  At 3:00pm, taking Invokana 100mg  and Pantoprazole 40mg  daily.  Not taking Metformin in evening.    Wt Readings from Last 3 Encounters:  04/03/17 251 lb (113.9 kg)  03/27/17 256 lb 9.6 oz (116.4 kg)  03/14/17 286 lb 13.1 oz (130.1 kg)   BP Readings from Last 3 Encounters:  04/03/17 103/70  03/14/17 120/68  03/07/17 101/79   Immunization History  Administered Date(s) Administered  . Influenza,inj,Quad PF,36+ Mos 06/29/2015, 07/24/2016  . Pneumococcal Polysaccharide-23 06/29/2015  . Tdap 06/29/2015    Review of Systems  Constitutional: Negative for chills, diaphoresis, fatigue and fever.  Eyes: Negative for visual disturbance.  Respiratory: Negative for  cough and shortness of breath.   Cardiovascular: Negative for chest pain, palpitations and leg swelling.  Gastrointestinal: Positive for anal bleeding, constipation and rectal pain. Negative for abdominal distention, abdominal pain, blood in stool, diarrhea, nausea and vomiting.  Endocrine: Negative for cold intolerance, heat intolerance, polydipsia, polyphagia and polyuria.  Neurological: Positive for dizziness. Negative for tremors, seizures, syncope, facial asymmetry, speech difficulty, weakness, light-headedness, numbness and headaches.  Psychiatric/Behavioral: Negative for dysphoric mood. The patient is not nervous/anxious.     Past Medical History:  Diagnosis Date  . Carpal tunnel syndrome, bilateral   . Depression   . GERD (gastroesophageal reflux disease)   . H. pylori infection    dx  09-02-2014-- treated w/ antibiotic  . Hypertension    recently low pressures   . Irregular menstrual cycle   . Menorrhagia   . PCOS (polycystic ovarian syndrome)   . Type 2 diabetes mellitus ([place])    Past Surgical History:  Procedure Laterality Date  . CARPAL TUNNEL RELEASE Bilateral 10/07/2014   Procedure: CARPAL TUNNEL RELEASE BILATERAL;  Surgeon: [name], MD;  Location: [facility];  Service: Orthopedics;  Laterality: Bilateral;  . CESAREAN SECTION  1995  . GASTRIC ROUX-EN-Y N/A 03/12/2017   Procedure: LAPAROSCOPIC ROUX-EN-Y GASTRIC BYPASS WITH UPPER ENDOSCOPY;  Surgeon: Kieth Brightly, Arta Bruce, MD;  Location: [facility];  Service: General;  Laterality: N/A;  . UPPER GI ENDOSCOPY  03/12/2017   Procedure: UPPER GI ENDOSCOPY;  Surgeon:  Kinsinger, Arta Bruce, MD;  Location: [facility];  Service: General;;   Allergies  Allergen Reactions  . Food     RAW/FRESH FRUIT--PEACHES, PEARS, APPLES=THROAT CLOSES/SWELLING & COUGHING SHORTNESS OF BREATH  . 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 Shrimp [Shellfish Allergy] Anaphylaxis    Social History   Social History  . Marital status:  Married    Spouse name: N/A  . Number of children: 2  . Years of education: N/A   Occupational History  . Not on file.   Social History Main Topics  . Smoking status: Never Smoker  . Smokeless tobacco: Never Used  . Alcohol use 0.0 oz/week     Comment: socially  . Drug use: No  . Sexual activity: Yes    Partners: Male    Birth control/ protection: None   Other Topics Concern  . Not on file   Social History Narrative   Marital status: married x 1 year; from [place].  Moved to [place] since 2000 from [place]; born in [place].      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        Employment:  [employer] full time;       Tobacco: none      Alcohol: social      Drugs: none      Exercise: none   No reported caffeine use    Family History  Problem Relation Age of Onset  . Heart disease Mother 17       CAD  . Diabetes Mother   . Hypertension Mother   . Cancer Father 43       prostate cancer  . Diabetes Father   . Thyroid disease Neg Hx        Objective:    BP 103/70   Pulse 81   Temp 98 F (36.7 C) (Oral)   Resp 18   Ht 5' 2.21" (1.58 m)   Wt 251 lb (113.9 kg)   LMP 03/11/2017   SpO2 96%   BMI 45.61 kg/m  Physical Exam  Constitutional: She is oriented to person, place, and time. She appears well-developed and well-nourished. No distress.  HENT:  Head: Normocephalic and atraumatic.  Right Ear: External ear normal.  Left Ear: External ear normal.  Nose: Nose normal.  Mouth/Throat: Oropharynx is clear and moist.  Eyes: Conjunctivae and EOM are normal. Pupils are equal, round, and reactive to light.  Neck: Normal range of motion. Neck supple. Carotid bruit is not present. No thyromegaly present.  Cardiovascular: Normal rate, regular rhythm, normal heart sounds and intact distal pulses.  Exam reveals no gallop and no friction rub.   No murmur heard. Pulmonary/Chest:  Effort normal and breath sounds normal. She has no wheezes. She has no rales.  Abdominal: Soft. Bowel sounds are normal. She exhibits no distension and no mass. There is no tenderness. There is no rebound and no guarding.  Well-healing incisions abdomen.  Genitourinary: Rectal exam shows tenderness. Rectal exam shows no external hemorrhoid, no internal hemorrhoid, no fissure, no mass, anal tone normal and guaiac negative stool.  Lymphadenopathy:    She has no cervical adenopathy.  Neurological: She is alert and oriented to person, place, and time. No cranial nerve deficit.  Skin: Skin is warm and dry. No rash noted. She is not diaphoretic. No erythema. No pallor.  Psychiatric: She has a  normal mood and affect. Her behavior is normal.   Depression screen [facility] 2/9 04/03/2017 03/06/2017 02/25/2017 01/30/2017 01/02/2017  Decreased Interest 0 0 0 0 0  Down, Depressed, Hopeless 0 0 0 0 0  PHQ - 2 Score 0 0 0 0 0  Altered sleeping - - - - -  Tired, decreased energy - - - - -  Change in appetite - - - - -  Feeling bad or failure about yourself  - - - - -  Trouble concentrating - - - - -  Moving slowly or fidgety/restless - - - - -  Suicidal thoughts - - - - -  PHQ-9 Score - - - - -        Assessment & Plan:   1. Type 2 diabetes mellitus without complication, without long-term current use of insulin ([place])   2. Essential hypertension, benign   3. Slow transit constipation   4. Rectal bleeding   5. Anal fissure   6. Morbid obesity ([place])   7. S/P gastric bypass   8. Postoperative anemia due to acute blood loss    -thirty five pound weight loss since gastric bypass; congratulations! -blood pressure low; stop HCTZ. -sugars improving; stop Invokana; increase Metformin to 1000mg  bid. -suffering with constipation since gastric bypass; now with anal fissure by hx; rx for Anusol HC suppositories; increase laxative to qod.   Orders Placed This Encounter  Procedures  . Microalbumin, urine  . CBC with  Differential/Platelet  . Comprehensive metabolic panel  . POCT urinalysis dipstick   Meds ordered this encounter  Medications  . hydrocortisone (ANUSOL-HC) 25 MG suppository    Sig: Place 1 suppository (25 mg total) rectally 2 (two) times daily.    Dispense:  12 suppository    Refill:  0    Return in about 4 weeks (around 05/01/2017) for recheck DAIBETES, BLOOD PRESSURE, WEIGHT, ANEMIA.   Norwood Levo, M.D. Primary Care at [facility] previously [facility] [place] [address] [place], [place]  [address] [phone] phone [phone] fax

## 2017-04-04 DIAGNOSIS — Z9884 Bariatric surgery status: Secondary | ICD-10-CM | POA: Insufficient documentation

## 2017-04-04 DIAGNOSIS — D62 Acute posthemorrhagic anemia: Secondary | ICD-10-CM | POA: Insufficient documentation

## 2017-04-04 LAB — COMPREHENSIVE METABOLIC PANEL
A/G RATIO: 1.5 (ref 1.2–2.2)
ALBUMIN: 4.2 g/dL (ref 3.5–5.5)
ALT: 17 IU/L (ref 0–32)
AST: 22 IU/L (ref 0–40)
Alkaline Phosphatase: 70 IU/L (ref 39–117)
BILIRUBIN TOTAL: 0.4 mg/dL (ref 0.0–1.2)
BUN / CREAT RATIO: 17 (ref 9–23)
BUN: 12 mg/dL (ref 6–24)
CHLORIDE: 96 mmol/L (ref 96–106)
CO2: 26 mmol/L (ref 18–29)
Calcium: 9.4 mg/dL (ref 8.7–10.2)
Creatinine, Ser: 0.7 mg/dL (ref 0.57–1.00)
GFR calc non Af Amer: 109 mL/min/{1.73_m2} (ref >59)
GFR, EST AFRICAN AMERICAN: 125 mL/min/{1.73_m2} (ref >59)
Globulin, Total: 2.8 g/dL (ref 1.5–4.5)
Glucose: 89 mg/dL (ref 65–99)
POTASSIUM: 3.8 mmol/L (ref 3.5–5.2)
Sodium: 140 mmol/L (ref 134–144)
TOTAL PROTEIN: 7 g/dL (ref 6.0–8.5)

## 2017-04-04 LAB — CBC WITH DIFFERENTIAL/PLATELET
BASOS: 1 %
Basophils Absolute: 0.1 10*3/uL (ref 0.0–0.2)
EOS (ABSOLUTE): 0.3 10*3/uL (ref 0.0–0.4)
Eos: 3 %
Hematocrit: 39.3 % (ref 34.0–46.6)
Hemoglobin: 12.1 g/dL (ref 11.1–15.9)
IMMATURE GRANS (ABS): 0.1 10*3/uL (ref 0.0–0.1)
Immature Granulocytes: 1 %
LYMPHS: 34 %
Lymphocytes Absolute: 3 10*3/uL (ref 0.7–3.1)
MCH: 28.3 pg (ref 26.6–33.0)
MCHC: 30.8 g/dL — ABNORMAL LOW (ref 31.5–35.7)
MCV: 92 fL (ref 79–97)
Monocytes Absolute: 0.6 10*3/uL (ref 0.1–0.9)
Monocytes: 7 %
NEUTROS ABS: 4.8 10*3/uL (ref 1.4–7.0)
Neutrophils: 54 %
PLATELETS: 465 10*3/uL — AB (ref 150–379)
RBC: 4.28 x10E6/uL (ref 3.77–5.28)
RDW: 15 % (ref 12.3–15.4)
WBC: 8.8 10*3/uL (ref 3.4–10.8)

## 2017-04-04 LAB — MICROALBUMIN, URINE: Microalbumin, Urine: 42.1 ug/mL

## 2017-04-22 ENCOUNTER — Telehealth: Payer: Self-pay | Admitting: *Deleted

## 2017-04-22 ENCOUNTER — Encounter: Payer: Self-pay | Admitting: Family Medicine

## 2017-04-22 NOTE — Telephone Encounter (Signed)
Letter sent.

## 2017-04-24 DIAGNOSIS — Z9884 Bariatric surgery status: Secondary | ICD-10-CM | POA: Diagnosis not present

## 2017-04-24 DIAGNOSIS — E669 Obesity, unspecified: Secondary | ICD-10-CM | POA: Diagnosis not present

## 2017-04-24 DIAGNOSIS — R69 Illness, unspecified: Secondary | ICD-10-CM | POA: Diagnosis not present

## 2017-04-24 DIAGNOSIS — K912 Postsurgical malabsorption, not elsewhere classified: Secondary | ICD-10-CM | POA: Diagnosis not present

## 2017-04-29 ENCOUNTER — Encounter: Payer: Self-pay | Admitting: Skilled Nursing Facility1

## 2017-04-29 ENCOUNTER — Encounter: Payer: BLUE CROSS/BLUE SHIELD | Attending: General Surgery | Admitting: Skilled Nursing Facility1

## 2017-04-29 ENCOUNTER — Ambulatory Visit (INDEPENDENT_AMBULATORY_CARE_PROVIDER_SITE_OTHER): Payer: BLUE CROSS/BLUE SHIELD | Admitting: Family Medicine

## 2017-04-29 ENCOUNTER — Encounter: Payer: Self-pay | Admitting: Family Medicine

## 2017-04-29 VITALS — BP 131/85 | HR 64 | Temp 98.0°F | Resp 18 | Ht 62.21 in | Wt 242.0 lb

## 2017-04-29 DIAGNOSIS — E119 Type 2 diabetes mellitus without complications: Secondary | ICD-10-CM | POA: Diagnosis not present

## 2017-04-29 DIAGNOSIS — Z9884 Bariatric surgery status: Secondary | ICD-10-CM | POA: Diagnosis not present

## 2017-04-29 DIAGNOSIS — Z713 Dietary counseling and surveillance: Secondary | ICD-10-CM | POA: Diagnosis not present

## 2017-04-29 DIAGNOSIS — K219 Gastro-esophageal reflux disease without esophagitis: Secondary | ICD-10-CM

## 2017-04-29 DIAGNOSIS — E890 Postprocedural hypothyroidism: Secondary | ICD-10-CM | POA: Diagnosis not present

## 2017-04-29 DIAGNOSIS — I10 Essential (primary) hypertension: Secondary | ICD-10-CM

## 2017-04-29 DIAGNOSIS — L710 Perioral dermatitis: Secondary | ICD-10-CM | POA: Diagnosis not present

## 2017-04-29 DIAGNOSIS — E1165 Type 2 diabetes mellitus with hyperglycemia: Secondary | ICD-10-CM

## 2017-04-29 DIAGNOSIS — IMO0001 Reserved for inherently not codable concepts without codable children: Secondary | ICD-10-CM

## 2017-04-29 MED ORDER — DOXYCYCLINE HYCLATE 100 MG PO TABS: 100.0000 mg | ORAL_TABLET | Freq: Two times a day (BID) | ORAL | 0 refills | Status: DC

## 2017-04-29 MED ORDER — NYSTATIN 100000 UNIT/GM EX POWD: 1.0000 g | Freq: Two times a day (BID) | CUTANEOUS | 2 refills | Status: DC

## 2017-04-29 MED ORDER — DESONIDE 0.05 % EX CREA: TOPICAL_CREAM | Freq: Two times a day (BID) | CUTANEOUS | 0 refills | Status: DC

## 2017-04-29 NOTE — Progress Notes (Signed)
Subjective:    Patient ID: [name], female    DOB: 23-Aug-1976, 41 y.o.   MRN: [id number]  04/29/2017  Diabetes (1 month follow-up); Hypertension; and Anemia   HPI This 41 y.o. female presents for evaluation of DMII and hypertension.  Bariatric surgery 03/12/17; doing well.  Diarrhea: onset three days ago.  Three days ago developed abdominal pain and diarrhea.  Also vomited in past three days.  Grilled seafood; did well.  Ate with lemon.  Went to sleep.  Had loose stool this morning.  No diarrhea.  Then ate breakfast and beans; had diarrhea watery.  General surgeon released patient on 05/13/17 to regular duty.  Pt hesitant to return to work in two weeks yet.  Needs refill of medications. Metformin 1000mg  bid was low; stopped Metformin lats week; 100-125 all week without medication.  This morning 121 fasting sugar.    Rash on face and arms.  Changed calcium because caused constipation. Still unable to drink 64 ounces of water per day.  Sent for additional studies.  Not taking iron.  No Loratadine. Taking Pantoprazole.     BP Readings from Last 3 Encounters:  05/02/17 (!) 148/87  04/29/17 131/85  04/03/17 103/70   Wt Readings from Last 3 Encounters:  04/29/17 243 lb 6.4 oz (110.4 kg)  04/29/17 242 lb (109.8 kg)  04/03/17 251 lb (113.9 kg)   Immunization History  Administered Date(s) Administered  . Influenza,inj,Quad PF,36+ Mos 06/29/2015, 07/24/2016  . Pneumococcal Polysaccharide-23 06/29/2015  . Tdap 06/29/2015    Review of Systems  Constitutional: Negative for chills, diaphoresis, fatigue and fever.  Eyes: Negative for visual disturbance.  Respiratory: Negative for cough and shortness of breath.   Cardiovascular: Negative for chest pain, palpitations and leg swelling.  Gastrointestinal: Positive for diarrhea. Negative for abdominal pain, constipation, nausea and vomiting.  Endocrine: Negative for cold intolerance, heat intolerance, polydipsia, polyphagia and  polyuria.  Skin: Positive for rash.  Neurological: Negative for dizziness, tremors, seizures, syncope, facial asymmetry, speech difficulty, weakness, light-headedness, numbness and headaches.    Past Medical History:  Diagnosis Date  . Carpal tunnel syndrome, bilateral   . Depression   . GERD (gastroesophageal reflux disease)   . H. pylori infection    dx  09-02-2014-- treated w/ antibiotic  . Hypertension    recently low pressures   . Irregular menstrual cycle   . Menorrhagia   . PCOS (polycystic ovarian syndrome)   . Type 2 diabetes mellitus ([place])    Past Surgical History:  Procedure Laterality Date  . CARPAL TUNNEL RELEASE Bilateral 10/07/2014   Procedure: CARPAL TUNNEL RELEASE BILATERAL;  Surgeon: [name], MD;  Location: [facility];  Service: Orthopedics;  Laterality: Bilateral;  . CESAREAN SECTION  1995  . GASTRIC ROUX-EN-Y N/A 03/12/2017   Procedure: LAPAROSCOPIC ROUX-EN-Y GASTRIC BYPASS WITH UPPER ENDOSCOPY;  Surgeon: Kieth Brightly, Arta Bruce, MD;  Location[facility];  Service: General;  Laterality: N/A;  . UPPER GI ENDOSCOPY  03/12/2017   Procedure: UPPER GI ENDOSCOPY;  Surgeon: Kieth Brightly Arta Bruce, MD;  Location[facility];  Service: General;;   Allergies  Allergen Reactions  . Food     RAW/FRESH FRUIT--PEACHES, PEARS, APPLES=THROAT CLOSES/SWELLING & COUGHING SHORTNESS OF BREATH  . 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 Shrimp [Shellfish Allergy] Anaphylaxis    Social History   Social History  . Marital status: Married    Spouse name: N/A  . Number of children: 2  . Years of education: N/A  Occupational History  . Not on file.   Social History Main Topics  . Smoking status: Never Smoker  . Smokeless tobacco: Never Used  . Alcohol use 0.0 oz/week     Comment: socially  . Drug use: No  . Sexual activity: Yes    Partners: Male    Birth control/ protection: None   Other Topics Concern  . Not on file   Social History Narrative   Marital  status: married x 1 year; from [place].  Moved to [place] since 2000 from [place]; born in [place].      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 full time;       Tobacco: none      Alcohol: social      Drugs: none      Exercise: none   No reported caffeine use    Family History  Problem Relation Age of Onset  . Heart disease Mother 94       CAD  . Diabetes Mother   . Hypertension Mother   . Cancer Father 83       prostate cancer  . Diabetes Father   . Thyroid disease Neg Hx        Objective:    BP 131/85   Pulse 64   Temp 98 F (36.7 C) (Oral)   Resp 18   Ht 5' 2.21" (1.58 m)   Wt 242 lb (109.8 kg)   LMP 04/25/2017   SpO2 98%   BMI 43.97 kg/m  Physical Exam  Constitutional: She is oriented to person, place, and time. She appears well-developed and well-nourished. No distress.  HENT:  Head: Normocephalic and atraumatic.  Right Ear: External ear normal.  Left Ear: External ear normal.  Nose: Nose normal.  Mouth/Throat: Oropharynx is clear and moist.  Eyes: Conjunctivae and EOM are normal. Pupils are equal, round, and reactive to light.  Neck: Normal range of motion. Neck supple. Carotid bruit is not present. No thyromegaly present.  Cardiovascular: Normal rate, regular rhythm, normal heart sounds and intact distal pulses.  Exam reveals no gallop and no friction rub.   No murmur heard. Pulmonary/Chest: Effort normal and breath sounds normal. She has no wheezes. She has no rales.  Abdominal: Soft. Bowel sounds are normal. She exhibits no distension and no mass. There is no tenderness. There is no rebound and no guarding.  Lymphadenopathy:    She has no cervical adenopathy.  Neurological: She is alert and oriented to person, place, and time. No cranial nerve deficit.  Skin: Skin is warm and dry. Rash noted. She is not diaphoretic. No erythema. No  pallor.  +scattered maculopapular rash facial in perioral region.  No vesicles; scattered pustules.  Psychiatric: She has a normal mood and affect. Her behavior is normal.   Results for orders placed or performed in visit on 04/29/17  CBC with Differential/Platelet  Result Value Ref Range   WBC 7.0 3.4 - 10.8 x10E3/uL   RBC 4.46 3.77 - 5.28 x10E6/uL   Hemoglobin 12.8 11.1 - 15.9 g/dL   Hematocrit 40.4 34.0 - 46.6 %   MCV 91 79 - 97 fL   MCH 28.7 26.6 - 33.0 pg   MCHC 31.7 31.5 - 35.7 g/dL   RDW 14.7 12.3 - 15.4 %   Platelets 342 150 -  379 x10E3/uL   Neutrophils 49 Not Estab. %   Lymphs 40 Not Estab. %   Monocytes 6 Not Estab. %   Eos 5 Not Estab. %   Basos 0 Not Estab. %   Neutrophils Absolute 3.4 1.4 - 7.0 x10E3/uL   Lymphocytes Absolute 2.8 0.7 - 3.1 x10E3/uL   Monocytes Absolute 0.4 0.1 - 0.9 x10E3/uL   EOS (ABSOLUTE) 0.4 0.0 - 0.4 x10E3/uL   Basophils Absolute 0.0 0.0 - 0.2 x10E3/uL   Immature Granulocytes 0 Not Estab. %   Immature Grans (Abs) 0.0 0.0 - 0.1 x10E3/uL  Comprehensive metabolic panel  Result Value Ref Range   Glucose 92 65 - 99 mg/dL   BUN 9 6 - 24 mg/dL   Creatinine, Ser 0.60 0.57 - 1.00 mg/dL   GFR calc non Af Amer 114 >59 mL/min/1.73   GFR calc Af Amer 132 >59 mL/min/1.73   BUN/Creatinine Ratio 15 9 - 23   Sodium 143 134 - 144 mmol/L   Potassium 3.7 3.5 - 5.2 mmol/L   Chloride 101 96 - 106 mmol/L   CO2 23 20 - 29 mmol/L   Calcium 9.7 8.7 - 10.2 mg/dL   Total Protein 7.1 6.0 - 8.5 g/dL   Albumin 4.3 3.5 - 5.5 g/dL   Globulin, Total 2.8 1.5 - 4.5 g/dL   Albumin/Globulin Ratio 1.5 1.2 - 2.2   Bilirubin Total 0.4 0.0 - 1.2 mg/dL   Alkaline Phosphatase 60 39 - 117 IU/L   AST 22 0 - 40 IU/L   ALT 19 0 - 32 IU/L  TSH  Result Value Ref Range   TSH 18.130 (H) 0.450 - 4.500 uIU/mL  T4, free  Result Value Ref Range   Free T4 0.92 0.82 - 1.77 ng/dL  Lipid panel  Result Value Ref Range   Cholesterol, Total 175 100 - 199 mg/dL   Triglycerides 133 0 -  149 mg/dL   HDL 43 >39 mg/dL   VLDL Cholesterol Cal 27 5 - 40 mg/dL   LDL Calculated 105 (H) 0 - 99 mg/dL   Chol/HDL Ratio 4.1 0.0 - 4.4 ratio       Assessment & Plan:   1. Uncontrolled type 2 diabetes mellitus without complication, without long-term current use of insulin ([place])   2. Essential hypertension, benign   3. Gastroesophageal reflux disease without esophagitis   4. S/P gastric bypass   5. Morbid obesity ([place])   6. Perioral dermatitis   7. Postablative hypothyroidism   8. Type 2 diabetes mellitus without complication, without long-term current use of insulin ([facility])    -decrease Metformin to 500mg  bid with food. -congratulations on weight loss with bariatric surgery; struggling to drink required fluid intake per day.   -obtain labs. -new onset perioral dermatitis: New onset; rx for Doxy, Nystatin, Desowen.  Orders Placed This Encounter  Procedures  . CBC with Differential/Platelet  . Comprehensive metabolic panel    Order Specific Question:   Has the patient fasted?    Answer:   Yes  . TSH  . T4, free  . Lipid panel    Order Specific Question:   Has the patient fasted?    Answer:   Yes   Meds ordered this encounter  Medications  . nystatin (NYSTATIN) powder    Sig: Apply 1 g topically 2 (two) times daily.    Dispense:  56 g    Refill:  2  . doxycycline (VIBRA-TABS) 100 MG tablet    Sig: Take 1 tablet (100  mg total) by mouth 2 (two) times daily.    Dispense:  30 tablet    Refill:  0  . desonide (DESOWEN) 0.05 % cream    Sig: Apply topically 2 (two) times daily.    Dispense:  15 g    Refill:  0  . metFORMIN (GLUCOPHAGE) 500 MG tablet    Sig: Take 1 tabs by mouth (500 mg ) 2 times daily.    Dispense:  360 tablet    Refill:  3    Return in about 4 weeks (around 05/27/2017) for recheck diabetes.   Flonnie Wierman Elayne Guerin, M.D. Primary Care at [facility] previously [facility] [place] [address] [place], [place]  [address] [phone]  phone [phone] fax

## 2017-04-29 NOTE — Patient Instructions (Addendum)
-  Be sure to sip on fluid throughout the day  -Try to eat something every 2-3 hours  -Write down everything you ate and drank so everything you put in your mouth for 1 full week

## 2017-04-29 NOTE — Patient Instructions (Addendum)
   Decrease Metformin 500mg  one tablet twice daily with food.  IF you received an x-ray today, you will receive an invoice from [facility]. Please contact [facility] at [phone] with questions or concerns regarding your invoice.   IF you received labwork today, you will receive an invoice from [employer] [facility]. Please contact [facility] at [phone] with questions or concerns regarding your invoice.   Our billing staff will not be able to assist you with questions regarding bills from these companies.  You will be contacted with the lab results as soon as they are available. The fastest way to get your results is to activate your My Chart account. Instructions are located on the last page of this paperwork. If you have not heard from [place] regarding the results in 2 weeks, please contact this office.

## 2017-04-29 NOTE — Progress Notes (Signed)
Follow-up visit:  8 Weeks Post-Operative RYGB Surgery  Medical Nutrition Therapy:  Appt start time: 4:13 end time:  5:00  Primary concerns today: Post-operative Bariatric Surgery Nutrition Management.  Pt states she currently has diarrhea but was having constipation. Pt states she drinks 3-4 ounces and then burps it up. Pt states she now only takes 2 metformin down from 4 and no other diabetes medication or blood pressure. Pt states she does not feel hunger but she does eat. Pt states she tried milk but it tasted bad. Pt states she is taking the protonix. Pt states she was having low blood sugars before her blood sugar medicine was changed. Pt states the celebrate calcium made her constipated. Pt states she loves chicken, fish broth, and beef broth. Pt states she has been feeling pain in her upper left quadrant of her abdomen.  At the end of the appointment pt divulges she has been drinking and eating at the same time which explains her regurgitation/getting full off a small amount.   Surgery date: 03/12/2017 Surgery type: RYGB [facility] weight at [facility]: 282 Weight today: 243.4 Wt change: 13.2  TANITA  BODY COMP RESULTS  03/27/2017 04/29/2017   BMI (kg/m^2) 46.9 44.5   Fat Mass (lbs) 129.6 111.8   Fat Free Mass (lbs) 127 131.6   Total Body Water (lbs) 93.8 96.4    24-hr recall: B (AM): protein shake Snk (AM):  L (PM): beans  Snk (PM): sugar free jello or papcicle D (PM): eggs---seafood fish or shellfish  Snk (PM): low fat cheese   Fluid intake: powerade zero water, protein shake: 50 ounces, chicken broth and beef broth Estimated total protein intake: 60  Medications: see list Supplementation: calcium and opurity multivitamin   CBG monitoring: daily Average CBG per patient: 70-125 Last patient reported A1c: 9.1   Using straws: unknown Drinking while eating: yes Having you been chewing well:yes Chewing/swallowing difficulties: no Changes in vision: no Changes to mood/headaches:  no Hair loss/C[place]nges to skin/Changes to nails: no Any difficulty focusing or concentrating: no Sweating: no Dizziness/Lightheaded: no Palpitations: no  Carbonated beverages: no N/V/D/C/GAS: diarrhea  Abdominal Pain: no Dumping syndrome: no  Recent physical activity:  Walking 30 minutes every day and some zumba  Progress Towards Goal(s):  In progress.  Handouts given during visit include:  none   Nutritional Diagnosis:  [place]-3.3 Overweight/obesity related to past poor dietary habits and physical inactivity as evidenced by patient w/ recent YRGBsurgery following dietary guidelines for continued weight loss.    Intervention:  Nutrition counseling. Goals: -Be sure to sip on fluid throughout the day -Try to eat something every 2-3 hours -Write down everything you ate and drank so everything you put in your mouth for 1 full week  Teaching Method Utilized:  Visual Auditory Hands on Barriers to learning/adherence to lifestyle change: none identified   Demonstrated degree of understanding via:  Teach Back   Monitoring/Evaluation:  Dietary intake, exercise, lap band fills, and body weight.

## 2017-04-30 LAB — COMPREHENSIVE METABOLIC PANEL
ALBUMIN: 4.3 g/dL (ref 3.5–5.5)
ALK PHOS: 60 IU/L (ref 39–117)
ALT: 19 IU/L (ref 0–32)
AST: 22 IU/L (ref 0–40)
Albumin/Globulin Ratio: 1.5 (ref 1.2–2.2)
BUN / CREAT RATIO: 15 (ref 9–23)
BUN: 9 mg/dL (ref 6–24)
Bilirubin Total: 0.4 mg/dL (ref 0.0–1.2)
CO2: 23 mmol/L (ref 20–29)
CREATININE: 0.6 mg/dL (ref 0.57–1.00)
Calcium: 9.7 mg/dL (ref 8.7–10.2)
Chloride: 101 mmol/L (ref 96–106)
GFR, EST AFRICAN AMERICAN: 132 mL/min/{1.73_m2} (ref >59)
GFR, EST NON AFRICAN AMERICAN: 114 mL/min/{1.73_m2} (ref >59)
GLOBULIN, TOTAL: 2.8 g/dL (ref 1.5–4.5)
Glucose: 92 mg/dL (ref 65–99)
Potassium: 3.7 mmol/L (ref 3.5–5.2)
SODIUM: 143 mmol/L (ref 134–144)
TOTAL PROTEIN: 7.1 g/dL (ref 6.0–8.5)

## 2017-04-30 LAB — CBC WITH DIFFERENTIAL/PLATELET
Basophils Absolute: 0 10*3/uL (ref 0.0–0.2)
Basos: 0 %
EOS (ABSOLUTE): 0.4 10*3/uL (ref 0.0–0.4)
EOS: 5 %
HEMATOCRIT: 40.4 % (ref 34.0–46.6)
HEMOGLOBIN: 12.8 g/dL (ref 11.1–15.9)
IMMATURE GRANS (ABS): 0 10*3/uL (ref 0.0–0.1)
Immature Granulocytes: 0 %
LYMPHS ABS: 2.8 10*3/uL (ref 0.7–3.1)
Lymphs: 40 %
MCH: 28.7 pg (ref 26.6–33.0)
MCHC: 31.7 g/dL (ref 31.5–35.7)
MCV: 91 fL (ref 79–97)
MONOCYTES: 6 %
Monocytes Absolute: 0.4 10*3/uL (ref 0.1–0.9)
NEUTROS ABS: 3.4 10*3/uL (ref 1.4–7.0)
Neutrophils: 49 %
Platelets: 342 10*3/uL (ref 150–379)
RBC: 4.46 x10E6/uL (ref 3.77–5.28)
RDW: 14.7 % (ref 12.3–15.4)
WBC: 7 10*3/uL (ref 3.4–10.8)

## 2017-04-30 LAB — TSH: TSH: 18.13 u[IU]/mL — AB (ref 0.450–4.500)

## 2017-04-30 LAB — T4, FREE: Free T4: 0.92 ng/dL (ref 0.82–1.77)

## 2017-04-30 LAB — LIPID PANEL
CHOL/HDL RATIO: 4.1 ratio (ref 0.0–4.4)
Cholesterol, Total: 175 mg/dL (ref 100–199)
HDL: 43 mg/dL (ref >39)
LDL CALC: 105 mg/dL — AB (ref 0–99)
Triglycerides: 133 mg/dL (ref 0–149)
VLDL CHOLESTEROL CAL: 27 mg/dL (ref 5–40)

## 2017-05-02 ENCOUNTER — Encounter (HOSPITAL_COMMUNITY): Payer: Self-pay

## 2017-05-02 ENCOUNTER — Emergency Department (HOSPITAL_COMMUNITY)
Admission: EM | Admit: 2017-05-02 | Discharge: 2017-05-02 | Disposition: A | Discharge status: Home / Self Care | Payer: BLUE CROSS/BLUE SHIELD | Attending: Emergency Medicine | Admitting: Emergency Medicine

## 2017-05-02 DIAGNOSIS — E039 Hypothyroidism, unspecified: Secondary | ICD-10-CM | POA: Insufficient documentation

## 2017-05-02 DIAGNOSIS — E119 Type 2 diabetes mellitus without complications: Secondary | ICD-10-CM | POA: Insufficient documentation

## 2017-05-02 DIAGNOSIS — Z7984 Long term (current) use of oral hypoglycemic drugs: Secondary | ICD-10-CM | POA: Insufficient documentation

## 2017-05-02 DIAGNOSIS — R310 Gross hematuria: Secondary | ICD-10-CM | POA: Diagnosis not present

## 2017-05-02 DIAGNOSIS — I10 Essential (primary) hypertension: Secondary | ICD-10-CM | POA: Diagnosis not present

## 2017-05-02 DIAGNOSIS — Z79899 Other long term (current) drug therapy: Secondary | ICD-10-CM | POA: Insufficient documentation

## 2017-05-02 DIAGNOSIS — R319 Hematuria, unspecified: Secondary | ICD-10-CM | POA: Diagnosis not present

## 2017-05-02 LAB — I-STAT BETA HCG BLOOD, ED (MC, WL, AP ONLY): I-stat hCG, quantitative: <5 m[IU]/mL (ref <5)

## 2017-05-02 LAB — WET PREP, GENITAL
Clue Cells Wet Prep HPF POC: NONE SEEN
SPERM: NONE SEEN
Trich, Wet Prep: NONE SEEN
YEAST WET PREP: NONE SEEN

## 2017-05-02 LAB — URINALYSIS, ROUTINE W REFLEX MICROSCOPIC
PH: 6.5 (ref 5.0–8.0)
SPECIFIC GRAVITY, URINE: 1.015 (ref 1.005–1.030)

## 2017-05-02 LAB — I-STAT CHEM 8, ED
BUN: 8 mg/dL (ref 6–20)
CALCIUM ION: 1.09 mmol/L — AB (ref 1.15–1.40)
Chloride: 103 mmol/L (ref 101–111)
Creatinine, Ser: 0.6 mg/dL (ref 0.44–1.00)
GLUCOSE: 97 mg/dL (ref 65–99)
HCT: 37 % (ref 36.0–46.0)
HEMOGLOBIN: 12.6 g/dL (ref 12.0–15.0)
POTASSIUM: 3.2 mmol/L — AB (ref 3.5–5.1)
Sodium: 142 mmol/L (ref 135–145)
TCO2: 24 mmol/L (ref 0–100)

## 2017-05-02 LAB — GC/CHLAMYDIA PROBE AMP (~~LOC~~) NOT AT ARMC
CHLAMYDIA, DNA PROBE: NEGATIVE
Neisseria Gonorrhea: NEGATIVE

## 2017-05-02 MED ORDER — FOSFOMYCIN TROMETHAMINE 3 G PO PACK
3.0000 g | PACK | Freq: Once | ORAL | Status: AC
  Administered 2017-05-02: 3 g via ORAL
  Filled 2017-05-02 (×2): qty 3

## 2017-05-02 NOTE — ED Provider Notes (Signed)
[facility] DEPT Provider Note   CSN: [id number] Arrival date & time: 05/02/17  0702     History   Chief Complaint Chief Complaint  Patient presents with  . Hematuria    HPI [name] is a 41 y.o. female.  41 yo F with a cc of hematuria.  Going on since this morning.  Has had three episodes or so of bleeding.  ? On her period, though no blood on her tampon.  Increased urgency and frequency. Hx of a UTI in the remote past.    The history is provided by the patient.  Hematuria  This is a new problem. The current episode started 1 to 2 hours ago. The problem occurs constantly. The problem has not changed since onset.Pertinent negatives include no chest pain, no abdominal pain, no headaches and no shortness of breath. Nothing aggravates the symptoms. Nothing relieves the symptoms. She has tried nothing for the symptoms. The treatment provided no relief.    Past Medical History:  Diagnosis Date  . Carpal tunnel syndrome, bilateral   . Depression   . GERD (gastroesophageal reflux disease)   . H. pylori infection    dx  09-02-2014-- treated w/ antibiotic  . Hypertension    recently low pressures   . Irregular menstrual cycle   . Menorrhagia   . PCOS (polycystic ovarian syndrome)   . Type 2 diabetes mellitus [facility])     Patient Active Problem List   Diagnosis Date Noted  . S/P gastric bypass 04/04/2017  . Postoperative anemia due to acute blood loss 04/04/2017  . Morbid obesity ([place]) 03/12/2017  . Hypothyroidism 09/11/2016  . Essential hypertension, benign 06/29/2015  . Allergic rhinitis due to pollen 06/29/2015  . Anxiety and depression 06/29/2015  . Gastroesophageal reflux disease without esophagitis 06/29/2015  . OSA on CPAP 06/29/2015  . History of syphilis 05/23/2015  . Amenorrhea 05/23/2015  . Irregular menstrual cycle 11/12/2013  . Type II diabetes mellitus, uncontrolled ([place]) 06/20/2009  . POLYCYSTIC OVARIAN DISEASE 06/20/2009    Past Surgical History:   Procedure Laterality Date  . CARPAL TUNNEL RELEASE Bilateral 10/07/2014   Procedure: CARPAL TUNNEL RELEASE BILATERAL;  Surgeon: [name], MD;  Location: [facility];  Service: Orthopedics;  Laterality: Bilateral;  . CESAREAN SECTION  1995  . GASTRIC ROUX-EN-Y N/A 03/12/2017   Procedure: LAPAROSCOPIC ROUX-EN-Y GASTRIC BYPASS WITH UPPER ENDOSCOPY;  Surgeon: Kieth Brightly, Arta Bruce, MD;  Location: [facility];  Service: General;  Laterality: N/A;  . UPPER GI ENDOSCOPY  03/12/2017   Procedure: UPPER GI ENDOSCOPY;  Surgeon: Kieth Brightly, Arta Bruce, MD;  Location: [facility];  Service: General;;    OB History    Gravida Para Term Preterm AB Living   2 2 1 1   2    SAB TAB Ectopic Multiple Live Births           2       Home Medications    Prior to Admission medications   Medication Sig Start Date End Date Taking? Authorizing Provider  desonide (DESOWEN) 0.05 % cream Apply topically 2 (two) times daily. 04/29/17   Wardell Honour, MD  doxycycline (VIBRA-TABS) 100 MG tablet Take 1 tablet (100 mg total) by mouth 2 (two) times daily. 04/29/17   Wardell Honour, MD  ferrous sulfate 300 (60 Fe) MG/5ML syrup Take 5 mLs (300 mg total) by mouth daily. 03/14/17   Kinsinger, Arta Bruce, MD  glucose blood (FREESTYLE TEST STRIPS) test strip Please fill with preferred  strip and check sugar once daily 02/03/17   Wardell Honour, MD  hydrocortisone (ANUSOL-HC) 25 MG suppository Place 1 suppository (25 mg total) rectally 2 (two) times daily. 04/03/17   Wardell Honour, MD  levothyroxine (SYNTHROID, LEVOTHROID) 100 MCG tablet Take 1 tablet (100 mcg total) by mouth daily. 09/12/16   Renato Shin, MD  loratadine (CLARITIN) 10 MG tablet Take 1 tablet (10 mg total) by mouth daily. 07/24/16   Wardell Honour, MD  metFORMIN (GLUCOPHAGE) 500 MG tablet Take 2 tabs by mouth (1000 mg ) 2 times daily. 01/02/17   Wardell Honour, MD  nystatin (NYSTATIN) powder Apply 1 g topically 2 (two) times daily. 04/29/17   Wardell Honour,  MD  Olopatadine HCl 0.2 % SOLN Apply 1 drop to eye daily. Patient taking differently: Apply 1 drop to eye daily as needed (for conjuctivitis).  01/30/17   Wardell Honour, MD  pantoprazole (PROTONIX) 40 MG tablet Take 1 tablet (40 mg total) by mouth daily. 03/14/17   Kinsinger, Arta Bruce, MD    Family History Family History  Problem Relation Age of Onset  . Heart disease Mother 41       CAD  . Diabetes Mother   . Hypertension Mother   . Cancer Father 69       prostate cancer  . Diabetes Father   . Thyroid disease Neg Hx     Social History Social History  Substance Use Topics  . Smoking status: Never Smoker  . Smokeless tobacco: Never Used  . Alcohol use 0.0 oz/week     Comment: socially     Allergies   Food; Peach [prunus persica]; and Shrimp [shellfish allergy]   Review of Systems Review of Systems  Constitutional: Negative for chills and fever.  HENT: Negative for congestion and rhinorrhea.   Eyes: Negative for redness and visual disturbance.  Respiratory: Negative for shortness of breath and wheezing.   Cardiovascular: Negative for chest pain and palpitations.  Gastrointestinal: Negative for abdominal pain, nausea and vomiting.  Genitourinary: Positive for hematuria. Negative for dysuria and urgency.  Musculoskeletal: Negative for arthralgias and myalgias.  Skin: Negative for pallor and wound.  Neurological: Negative for dizziness and headaches.     Physical Exam Updated Vital Signs BP (!) 148/87   Pulse (!) 56   Temp (!) 97.5 F (36.4 C) (Oral)   Resp 18   LMP 04/25/2017   SpO2 100%   Physical Exam  Constitutional: She is oriented to person, place, and time. She appears well-developed and well-nourished. No distress.  HENT:  Head: Normocephalic and atraumatic.  Eyes: EOM are normal. Pupils are equal, round, and reactive to light.  Neck: Normal range of motion. Neck supple.  Cardiovascular: Normal rate and regular rhythm.  Exam reveals no gallop and no  friction rub.   No murmur heard. Pulmonary/Chest: Effort normal. She has no wheezes. She has no rales.  Abdominal: Soft. She exhibits no distension and no mass. There is no tenderness. There is no guarding.  Genitourinary: Cervix exhibits discharge and friability. Cervix exhibits no motion tenderness. No bleeding in the vagina.  Genitourinary Comments: Trace blood at Os. Whitish discharge.   Musculoskeletal: She exhibits no edema or tenderness.  Neurological: She is alert and oriented to person, place, and time.  Skin: Skin is warm and dry. She is not diaphoretic.  Psychiatric: She has a normal mood and affect. Her behavior is normal.  Nursing note and vitals reviewed.    ED Treatments /  Results  Labs (all labs ordered are listed, but only abnormal results are displayed) Labs Reviewed  WET PREP, GENITAL - Abnormal; Notable for the following:       Result Value   WBC, Wet Prep HPF POC MANY (*)    All other components within normal limits  URINALYSIS, ROUTINE W REFLEX MICROSCOPIC - Abnormal; Notable for the following:    Color, Urine RED (*)    APPearance BLOODY (*)    Glucose, UA   (*)    Value: TEST NOT REPORTED DUE TO COLOR INTERFERENCE OF URINE PIGMENT   Hgb urine dipstick   (*)    Value: TEST NOT REPORTED DUE TO COLOR INTERFERENCE OF URINE PIGMENT   Bilirubin Urine   (*)    Value: TEST NOT REPORTED DUE TO COLOR INTERFERENCE OF URINE PIGMENT   Ketones, ur   (*)    Value: TEST NOT REPORTED DUE TO COLOR INTERFERENCE OF URINE PIGMENT   Protein, ur   (*)    Value: TEST NOT REPORTED DUE TO COLOR INTERFERENCE OF URINE PIGMENT   Nitrite   (*)    Value: TEST NOT REPORTED DUE TO COLOR INTERFERENCE OF URINE PIGMENT   Leukocytes, UA   (*)    Value: TEST NOT REPORTED DUE TO COLOR INTERFERENCE OF URINE PIGMENT   Bacteria, UA FEW (*)    Squamous Epithelial / LPF 6-30 (*)    All other components within normal limits  I-STAT CHEM 8, ED - Abnormal; Notable for the following:    Potassium  3.2 (*)    Calcium, Ion 1.09 (*)    All other components within normal limits  I-STAT BETA HCG BLOOD, ED ([facility], [facility], [facility] ONLY)  GC/CHLAMYDIA PROBE AMP ([place]) NOT AT [facility]    EKG  EKG Interpretation None       Radiology No results found.  Procedures Procedures (including critical care time)  Medications Ordered in ED Medications  fosfomycin (MONUROL) packet 3 g (3 g Oral Given 05/02/17 1026)     Initial Impression / Assessment and Plan / ED Course  I have reviewed the triage vital signs and the nursing notes.  Pertinent labs & imaging results that were available during my care of the patient were reviewed by me and considered in my medical decision making (see chart for details).     41 yo F with a cc of hematuria. Patient has had 5 or 6 episodes today with bright red blood mixed with clots in her urine. Pelvic exam with some blood at the os.  Patient had clearing of her urine while in the ED. Vital signs continued be stable. We'll give a dose of fosfomycin to cover for possible UTI. Urology follow-up.  1:35 PM:  I have discussed the diagnosis/risks/treatment options with the patient and family and believe the pt to be eligible for discharge home to follow-up with Urology. We also discussed returning to the ED immediately if new or worsening sx occur. We discussed the sx which are most concerning (e.g., sudden worsening pain, fever, inability to tolerate by mouth) that necessitate immediate return. Medications administered to the patient during their visit and any new prescriptions provided to the patient are listed below.  Medications given during this visit Medications  fosfomycin (MONUROL) packet 3 g (3 g Oral Given 05/02/17 1026)     The patient appears reasonably screen and/or stabilized for discharge and I doubt any other medical condition or other [facility] requiring further screening, evaluation, or treatment in  the ED at this time prior to discharge.    Final Clinical  Impressions(s) / ED Diagnoses   Final diagnoses:  Gross hematuria    New Prescriptions Discharge Medication List as of 05/02/2017  9:26 AM       Deno Etienne, DO 05/02/17 1335

## 2017-05-02 NOTE — ED Triage Notes (Signed)
Pt presents to the ed with complaints of blood in her urine that started at 0500. Denies any pain or any other symptoms.

## 2017-05-02 NOTE — ED Notes (Signed)
Pharmacy notified to please send mediaction

## 2017-05-02 NOTE — Discharge Instructions (Signed)
Call the urologist for an appointment

## 2017-05-07 ENCOUNTER — Ambulatory Visit: Payer: BLUE CROSS/BLUE SHIELD | Admitting: Skilled Nursing Facility1

## 2017-05-09 DIAGNOSIS — R351 Nocturia: Secondary | ICD-10-CM | POA: Diagnosis not present

## 2017-05-09 DIAGNOSIS — B9689 Other specified bacterial agents as the cause of diseases classified elsewhere: Secondary | ICD-10-CM | POA: Diagnosis not present

## 2017-05-09 DIAGNOSIS — N390 Urinary tract infection, site not specified: Secondary | ICD-10-CM | POA: Diagnosis not present

## 2017-05-09 DIAGNOSIS — R310 Gross hematuria: Secondary | ICD-10-CM | POA: Diagnosis not present

## 2017-05-11 MED ORDER — METFORMIN HCL 500 MG PO TABS: ORAL_TABLET | ORAL | 3 refills | Status: DC

## 2017-05-22 ENCOUNTER — Encounter: Payer: BLUE CROSS/BLUE SHIELD | Admitting: Skilled Nursing Facility1

## 2017-05-22 ENCOUNTER — Encounter: Payer: Self-pay | Admitting: Skilled Nursing Facility1

## 2017-05-22 DIAGNOSIS — Z713 Dietary counseling and surveillance: Secondary | ICD-10-CM | POA: Diagnosis not present

## 2017-05-22 DIAGNOSIS — E119 Type 2 diabetes mellitus without complications: Secondary | ICD-10-CM
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
## 2017-05-31 DIAGNOSIS — R310 Gross hematuria: Secondary | ICD-10-CM | POA: Diagnosis not present

## 2017-05-31 DIAGNOSIS — R350 Frequency of micturition: Secondary | ICD-10-CM | POA: Diagnosis not present

## 2017-06-06 ENCOUNTER — Ambulatory Visit: Payer: Managed Care, Other (non HMO) | Admitting: Family Medicine

## 2017-06-20 NOTE — Addendum Note (Signed)
Addendum  created 06/20/17 1435 by Roberts Gaudy, MD   Sign clinical note

## 2017-06-26 ENCOUNTER — Encounter: Payer: Self-pay | Admitting: Family Medicine

## 2017-06-26 ENCOUNTER — Ambulatory Visit (INDEPENDENT_AMBULATORY_CARE_PROVIDER_SITE_OTHER): Payer: Managed Care, Other (non HMO) | Admitting: Family Medicine

## 2017-06-26 VITALS — BP 134/86 | HR 68 | Temp 98.0°F | Resp 16 | Ht 62.99 in | Wt 225.0 lb

## 2017-06-26 DIAGNOSIS — E1165 Type 2 diabetes mellitus with hyperglycemia: Secondary | ICD-10-CM | POA: Diagnosis not present

## 2017-06-26 DIAGNOSIS — E890 Postprocedural hypothyroidism: Secondary | ICD-10-CM | POA: Diagnosis not present

## 2017-06-26 DIAGNOSIS — K219 Gastro-esophageal reflux disease without esophagitis: Secondary | ICD-10-CM

## 2017-06-26 DIAGNOSIS — B379 Candidiasis, unspecified: Secondary | ICD-10-CM | POA: Diagnosis not present

## 2017-06-26 DIAGNOSIS — IMO0001 Reserved for inherently not codable concepts without codable children: Secondary | ICD-10-CM

## 2017-06-26 DIAGNOSIS — R310 Gross hematuria: Secondary | ICD-10-CM

## 2017-06-26 DIAGNOSIS — Z9884 Bariatric surgery status: Secondary | ICD-10-CM

## 2017-06-26 DIAGNOSIS — I10 Essential (primary) hypertension: Secondary | ICD-10-CM

## 2017-06-26 LAB — POCT URINALYSIS DIP (MANUAL ENTRY)
BILIRUBIN UA: NEGATIVE
Blood, UA: NEGATIVE
Glucose, UA: NEGATIVE mg/dL
LEUKOCYTES UA: NEGATIVE
Nitrite, UA: NEGATIVE
PH UA: 5.5 (ref 5.0–8.0)
Protein Ur, POC: NEGATIVE mg/dL
SPEC GRAV UA: 1.025 (ref 1.010–1.025)
Urobilinogen, UA: 1 E.U./dL

## 2017-06-26 LAB — POCT GLYCOSYLATED HEMOGLOBIN (HGB A1C): HEMOGLOBIN A1C: 5.4

## 2017-06-26 LAB — GLUCOSE, POCT (MANUAL RESULT ENTRY): POC Glucose: 90 mg/dl (ref 70–99)

## 2017-06-26 MED ORDER — DOXYCYCLINE HYCLATE 100 MG PO TABS: 100.0000 mg | ORAL_TABLET | Freq: Two times a day (BID) | ORAL | 5 refills | Status: DC

## 2017-06-26 MED ORDER — NYSTATIN 100000 UNIT/GM EX CREA: 1.0000 "application " | TOPICAL_CREAM | Freq: Two times a day (BID) | CUTANEOUS | 5 refills | Status: DC

## 2017-06-26 MED ORDER — NYSTATIN 100000 UNIT/GM EX POWD: 1.0000 g | Freq: Two times a day (BID) | CUTANEOUS | 2 refills | Status: DC

## 2017-06-26 MED ORDER — LEVOTHYROXINE SODIUM 100 MCG PO TABS: 100.0000 ug | ORAL_TABLET | Freq: Every day | ORAL | 3 refills | Status: DC

## 2017-06-26 MED ORDER — CLINDAMYCIN PHOSPHATE 1 % EX GEL: Freq: Two times a day (BID) | CUTANEOUS | 1 refills | Status: DC

## 2017-06-26 NOTE — Progress Notes (Signed)
Subjective:    Patient ID: [name], female    DOB: 01/01/1976, 41 y.o.   MRN: [id number]  06/26/2017  Diabetes (follow-up) and Hypertension   HPI This 41 y.o. female presents for evaluation of DMII, hypertension, obesity s/p gastric bypass in 02/2017.  Continues to do well with weight.  Doing very well with diet.  Patient reports good compliance with medication, good tolerance to medication, and good symptom control.  Sugars are running 80-110 at home; not taking Metformin every day; only taking if sugar elevates.  Hematuria:  Presented to ED due to gross hematuria; referred to urology; cystoscopy negative.  CT abdomen/pelvis negative.  No recurrence.  Facial rash: recurrent; getting all these pimples with redness.     BP Readings from Last 3 Encounters:  06/26/17 134/86  05/02/17 (!) 148/87  04/29/17 131/85   Wt Readings from Last 3 Encounters:  06/26/17 225 lb (102.1 kg)  05/22/17 236 lb 12.8 oz (107.4 kg)  04/29/17 243 lb 6.4 oz (110.4 kg)   Immunization History  Administered Date(s) Administered  . Influenza,inj,Quad PF,6+ Mos 06/29/2015, 07/24/2016  . Pneumococcal Polysaccharide-23 06/29/2015  . Tdap 06/29/2015    Review of Systems  Constitutional: Negative for chills, diaphoresis, fatigue and fever.  Eyes: Negative for visual disturbance.  Respiratory: Negative for cough and shortness of breath.   Cardiovascular: Negative for chest pain, palpitations and leg swelling.  Gastrointestinal: Negative for abdominal pain, constipation, diarrhea, nausea and vomiting.  Endocrine: Negative for cold intolerance, heat intolerance, polydipsia, polyphagia and polyuria.  Genitourinary: Negative for decreased urine volume, dysuria, enuresis, flank pain, frequency, genital sores, hematuria, menstrual problem, pelvic pain, urgency, vaginal bleeding, vaginal discharge and vaginal pain.  Neurological: Negative for dizziness, tremors, seizures, syncope, facial asymmetry,  speech difficulty, weakness, light-headedness, numbness and headaches.  Psychiatric/Behavioral: Negative for dysphoric mood. The patient is not nervous/anxious.     Past Medical History:  Diagnosis Date  . Carpal tunnel syndrome, bilateral   . Depression   . GERD (gastroesophageal reflux disease)   . H. pylori infection    dx  09-02-2014-- treated w/ antibiotic  . Hypertension    recently low pressures   . Irregular menstrual cycle   . Menorrhagia   . PCOS (polycystic ovarian syndrome)   . Type 2 diabetes mellitus ([place])    Past Surgical History:  Procedure Laterality Date  . CARPAL TUNNEL RELEASE Bilateral 10/07/2014   Procedure: CARPAL TUNNEL RELEASE BILATERAL;  Surgeon: [name], MD;  Location: [facility];  Service: Orthopedics;  Laterality: Bilateral;  . CESAREAN SECTION  1995  . GASTRIC ROUX-EN-Y N/A 03/12/2017   Procedure: LAPAROSCOPIC ROUX-EN-Y GASTRIC BYPASS WITH UPPER ENDOSCOPY;  Surgeon: Kieth Brightly, Arta Bruce, MD;  Location: [facility];  Service: General;  Laterality: N/A;  . UPPER GI ENDOSCOPY  03/12/2017   Procedure: UPPER GI ENDOSCOPY;  Surgeon: Kieth Brightly Arta Bruce, MD;  Location: [facility];  Service: General;;   Allergies  Allergen Reactions  . Food     RAW/FRESH FRUIT--PEACHES, PEARS, APPLES=THROAT CLOSES/SWELLING & COUGHING SHORTNESS OF BREATH  . 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 Shrimp [Shellfish Allergy] Anaphylaxis   Current Outpatient Prescriptions  Medication Sig Dispense Refill  . desonide (DESOWEN) 0.05 % cream Apply topically 2 (two) times daily. 15 g 0  . ferrous sulfate 300 (60 Fe) MG/5ML syrup Take 5 mLs (300 mg total) by mouth daily. 150 mL 3  . glucose blood (FREESTYLE TEST STRIPS) test strip Please fill with preferred strip and check sugar  once daily 100 each 4  . hydrocortisone (ANUSOL-HC) 25 MG suppository Place 1 suppository (25 mg total) rectally 2 (two) times daily. 12 suppository 0  . levothyroxine (SYNTHROID, LEVOTHROID)  100 MCG tablet Take 1 tablet (100 mcg total) by mouth daily. 90 tablet 3  . loratadine (CLARITIN) 10 MG tablet Take 1 tablet (10 mg total) by mouth daily. 90 tablet 3  . metFORMIN (GLUCOPHAGE) 500 MG tablet Take 1 tabs by mouth (500 mg ) 2 times daily. 360 tablet 3  . nystatin (NYSTATIN) powder Apply 1 g topically 2 (two) times daily. 56 g 2  . Olopatadine HCl 0.2 % SOLN Apply 1 drop to eye daily. (Patient taking differently: Apply 1 drop to eye daily as needed (for conjuctivitis). ) 2.5 mL 2  . pantoprazole (PROTONIX) 40 MG tablet Take 1 tablet (40 mg total) by mouth daily. 30 tablet 1  . clindamycin (CLINDAGEL) 1 % gel Apply topically 2 (two) times daily. To face. 30 g 1  . doxycycline (VIBRA-TABS) 100 MG tablet Take 1 tablet (100 mg total) by mouth 2 (two) times daily. 60 tablet 5  . nystatin cream (MYCOSTATIN) Apply 1 application topically 2 (two) times daily. 30 g 5   No current facility-administered medications for this visit.    Social History   Social History  . Marital status: Married    Spouse name: N/A  . Number of children: 2  . Years of education: N/A   Occupational History  . Not on file.   Social History Main Topics  . Smoking status: Never Smoker  . Smokeless tobacco: Never Used  . Alcohol use 0.0 oz/week     Comment: socially  . Drug use: No  . Sexual activity: Yes    Partners: Male    Birth control/ protection: None   Other Topics Concern  . Not on file   Social History Narrative   Marital status: married x 1 year; from [place].  Moved to [place] since 2000 from [place]; born in [place].      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        Employment:  [employer] full time;       Tobacco: none      Alcohol: social      Drugs: none      Exercise: none   No reported caffeine use    Family History  Problem Relation Age of Onset  . Heart disease Mother 22        CAD  . Diabetes Mother   . Hypertension Mother   . Cancer Father 41       prostate cancer  . Diabetes Father   . Thyroid disease Neg Hx        Objective:    BP 134/86   Pulse 68   Temp 98 F (36.7 C) (Oral)   Resp 16   Ht 5' 2.99" (1.6 m)   Wt 225 lb (102.1 kg)   LMP  (LMP Unknown)   SpO2 97%   BMI 39.87 kg/m  Physical Exam  Constitutional: She is oriented to person, place, and time. She appears well-developed and well-nourished. No distress.  HENT:  Head: Normocephalic and atraumatic.  Right Ear: External ear normal.  Left Ear: External ear normal.  Nose: Nose normal.  Mouth/Throat: Oropharynx is clear and moist.  Eyes: Pupils are equal,  round, and reactive to light. Conjunctivae and EOM are normal.  Neck: Normal range of motion. Neck supple. Carotid bruit is not present. No thyromegaly present.  Cardiovascular: Normal rate, regular rhythm, normal heart sounds and intact distal pulses.  Exam reveals no gallop and no friction rub.   No murmur heard. Pulmonary/Chest: Effort normal and breath sounds normal. She has no wheezes. She has no rales.  Abdominal: Soft. Bowel sounds are normal. She exhibits no distension and no mass. There is no tenderness. There is no rebound and no guarding.  Lymphadenopathy:    She has no cervical adenopathy.  Neurological: She is alert and oriented to person, place, and time. No cranial nerve deficit.  Skin: Skin is warm and dry. Rash noted. She is not diaphoretic. There is erythema. No pallor.  Diffuse rash along face with scattered pustules.  Psychiatric: She has a normal mood and affect. Her behavior is normal.    No results found. Depression screen [facility] 2/9 06/26/2017 04/29/2017 04/03/2017 03/06/2017 02/25/2017  Decreased Interest 0 0 0 0 0  Down, Depressed, Hopeless 0 0 0 0 0  PHQ - 2 Score 0 0 0 0 0  Altered sleeping - - - - -  Tired, decreased energy - - - - -  Change in appetite - - - - -  Feeling bad or failure about yourself  - - - - -   Trouble concentrating - - - - -  Moving slowly or fidgety/restless - - - - -  Suicidal thoughts - - - - -  PHQ-9 Score - - - - -  Some recent data might be hidden   Fall Risk  06/26/2017 04/29/2017 04/03/2017 03/06/2017 02/25/2017  Falls in the past year? No No No No No  Number falls in past yr: - - - - -  Injury with [place]:   1. Uncontrolled type 2 diabetes mellitus without complication, without long-term current use of insulin ([place])   2. Postablative hypothyroidism   3. Essential hypertension, benign   4. Gastroesophageal reflux disease without esophagitis   5. S/P gastric bypass   6. Morbid obesity ([place])   7. Gross hematuria   8. Candidiasis    -glucose and HgbA1c now normal with PRN Metformin; discontinue Metformin now. -obtain labs for chronic disease management. -congratulations on weight loss; now no longer taking antihypertensives or diabetic agents. -s/p ED visit for gross hematuria; s/p urology consultation with cystoscopy by report; obtain records; no recurrence.  -recurrent facial rash; ddx includes perioral dermatitis versus rosacea; regardless, treatment is similar; rx for Doxycycline provided with refills due to recurrent nature; rx for Clindamycin gel to apply bid.  RTC for worsening or lack of improvement. -recurrent candidiasis in skin folds due to obesity; refill of nystatin.   Orders Placed This Encounter  Procedures  . Comprehensive metabolic panel  . TSH  . T4, free  . Urine Microscopic  . CBC with Differential/Platelet  . POCT glucose (manual entry)  . POCT glycosylated hemoglobin (Hb A1C)  . POCT urinalysis dipstick   Meds ordered this encounter  Medications  . doxycycline (VIBRA-TABS) 100 MG tablet    Sig: Take 1 tablet (100 mg total) by mouth 2 (two) times daily.    Dispense:  60 tablet    Refill:  5  . clindamycin (CLINDAGEL) 1 % gel    Sig: Apply topically 2 (  two) times daily. To face.     Dispense:  30 g    Refill:  1  . levothyroxine (SYNTHROID, LEVOTHROID) 100 MCG tablet    Sig: Take 1 tablet (100 mcg total) by mouth daily.    Dispense:  90 tablet    Refill:  3  . nystatin (NYSTATIN) powder    Sig: Apply 1 g topically 2 (two) times daily.    Dispense:  56 g    Refill:  2  . nystatin cream (MYCOSTATIN)    Sig: Apply 1 application topically 2 (two) times daily.    Dispense:  30 g    Refill:  5    Return in about 3 months (around 09/26/2017) for recheck sugar, thyroid, weight.   Kristi Elayne Guerin, M.D. Primary Care at [facility] previously [facility] [address] [place], [place]  [address] [phone] phone [phone] fax

## 2017-06-26 NOTE — Patient Instructions (Addendum)
  STOP METFORMIN.   IF you received an x-ray today, you will receive an invoice from [facility]. Please contact [facility] at [phone] with questions or concerns regarding your invoice.   IF you received labwork today, you will receive an invoice from [place]. Please contact [facility] at [phone] with questions or concerns regarding your invoice.   Our billing staff will not be able to assist you with questions regarding bills from these companies.  You will be contacted with the lab results as soon as they are available. The fastest way to get your results is to activate your My Chart account. Instructions are located on the last page of this paperwork. If you have not heard from [place] regarding the results in 2 weeks, please contact this office.

## 2017-06-27 LAB — URINALYSIS, MICROSCOPIC ONLY
Bacteria, UA: NONE SEEN
CASTS: NONE SEEN /LPF

## 2017-06-27 LAB — COMPREHENSIVE METABOLIC PANEL
ALK PHOS: 66 IU/L (ref 39–117)
ALT: 16 IU/L (ref 0–32)
AST: 23 IU/L (ref 0–40)
Albumin/Globulin Ratio: 1.9 (ref 1.2–2.2)
Albumin: 4.3 g/dL (ref 3.5–5.5)
BILIRUBIN TOTAL: 0.2 mg/dL (ref 0.0–1.2)
BUN / CREAT RATIO: 17 (ref 9–23)
BUN: 14 mg/dL (ref 6–24)
CO2: 25 mmol/L (ref 20–29)
Calcium: 9 mg/dL (ref 8.7–10.2)
Chloride: 101 mmol/L (ref 96–106)
Creatinine, Ser: 0.82 mg/dL (ref 0.57–1.00)
GFR calc non Af Amer: 89 mL/min/{1.73_m2} (ref >59)
GFR, EST AFRICAN AMERICAN: 103 mL/min/{1.73_m2} (ref >59)
GLUCOSE: 87 mg/dL (ref 65–99)
Globulin, Total: 2.3 g/dL (ref 1.5–4.5)
Potassium: 3.7 mmol/L (ref 3.5–5.2)
Sodium: 141 mmol/L (ref 134–144)
Total Protein: 6.6 g/dL (ref 6.0–8.5)

## 2017-06-27 LAB — CBC WITH DIFFERENTIAL/PLATELET
BASOS: 1 %
Basophils Absolute: 0.1 10*3/uL (ref 0.0–0.2)
EOS (ABSOLUTE): 0.4 10*3/uL (ref 0.0–0.4)
EOS: 5 %
HEMATOCRIT: 37.6 % (ref 34.0–46.6)
HEMOGLOBIN: 12 g/dL (ref 11.1–15.9)
Immature Grans (Abs): 0 10*3/uL (ref 0.0–0.1)
Immature Granulocytes: 0 %
LYMPHS ABS: 3.7 10*3/uL — AB (ref 0.7–3.1)
Lymphs: 46 %
MCH: 28.2 pg (ref 26.6–33.0)
MCHC: 31.9 g/dL (ref 31.5–35.7)
MCV: 89 fL (ref 79–97)
MONOCYTES: 6 %
Monocytes Absolute: 0.5 10*3/uL (ref 0.1–0.9)
Neutrophils Absolute: 3.4 10*3/uL (ref 1.4–7.0)
Neutrophils: 42 %
Platelets: 380 10*3/uL — ABNORMAL HIGH (ref 150–379)
RBC: 4.25 x10E6/uL (ref 3.77–5.28)
RDW: 15.2 % (ref 12.3–15.4)
WBC: 8.1 10*3/uL (ref 3.4–10.8)

## 2017-06-27 LAB — T4, FREE: Free T4: 0.48 ng/dL — ABNORMAL LOW (ref 0.82–1.77)

## 2017-06-27 LAB — TSH: TSH: 63.55 u[IU]/mL — ABNORMAL HIGH (ref 0.450–4.500)

## 2017-07-10 DIAGNOSIS — Z9884 Bariatric surgery status: Secondary | ICD-10-CM | POA: Diagnosis not present

## 2017-07-10 DIAGNOSIS — K912 Postsurgical malabsorption, not elsewhere classified: Secondary | ICD-10-CM | POA: Diagnosis not present

## 2017-07-10 DIAGNOSIS — R69 Illness, unspecified: Secondary | ICD-10-CM | POA: Diagnosis not present

## 2017-07-10 DIAGNOSIS — E669 Obesity, unspecified: Secondary | ICD-10-CM | POA: Diagnosis not present

## 2017-08-15 ENCOUNTER — Ambulatory Visit: Payer: BLUE CROSS/BLUE SHIELD | Admitting: Skilled Nursing Facility1

## 2017-08-21 ENCOUNTER — Encounter: Payer: BLUE CROSS/BLUE SHIELD | Attending: General Surgery | Admitting: Skilled Nursing Facility1

## 2017-08-21 ENCOUNTER — Encounter: Payer: Self-pay | Admitting: Skilled Nursing Facility1

## 2017-08-21 DIAGNOSIS — Z9884 Bariatric surgery status: Secondary | ICD-10-CM | POA: Insufficient documentation

## 2017-08-21 DIAGNOSIS — K912 Postsurgical malabsorption, not elsewhere classified: Secondary | ICD-10-CM | POA: Insufficient documentation

## 2017-08-21 DIAGNOSIS — E119 Type 2 diabetes mellitus without complications: Secondary | ICD-10-CM

## 2017-08-21 NOTE — Progress Notes (Signed)
Post-Operative RYGB Surgery  Medical Nutrition Therapy:  Appt start time: 4:13 end time:  5:00  Primary concerns today: Post-operative Bariatric Surgery Nutrition Management.  Pt states she has had another incident at work this time about her race. Pt states she drinks 2 ounces every day of orange juice to have a bowel movement. Pt states her husband wants to have another baby but she does not.   Surgery date: 03/12/2017 Surgery type: RYGB Start weight at [facility]: 282 Weight today: 212.6 Wt change: 24.2  TANITA  BODY COMP RESULTS  03/27/2017 04/29/2017 05/22/2017 08/21/2017   BMI (kg/m^2) 46.9 44.5 43.3 38.9   Fat Mass (lbs) 129.6 111.8 105.6 90.8   Fat Free Mass (lbs) 127 131.6 131.2 121.8   Total Body Water (lbs) 93.8 96.4 95.8 88.2    24-hr recall: pt states she is only eating 2-3 times a day B (5:30AM): [place]  Snk (7:50AM): boiled egg L (10PM): beans and steak and vegetables  Snk (12-12:30PM): beans with steak vegetables D (PM): eggs---seafood fish or shellfish  Snk (PM): low fat cheese or sugar free popcicle/jello  Fluid intake: powerade zero water, protein shake: 60-70 ounces, chicken broth and beef broth, orange juice, 100 calorie coffee drink Estimated total protein intake: 60  Medications: see list Supplementation: calcium and opurity multivitamin   CBG monitoring: daily Average CBG per patient: 80-90 Last patient reported A1c: 5.4  Using straws: unknown Drinking while eating: no Having you been chewing well:yes Chewing/swallowing difficulties: no Changes in vision: no Changes to mood/headaches: no Hair loss/Cahnges to skin/Changes to nails: no Any difficulty focusing or concentrating: no Sweating: no Dizziness/Lightheaded: no Palpitations: no  Carbonated beverages: no N/V/D/C/GAS: NO Abdominal Pain: no Dumping syndrome: no  Recent physical activity:  Walking 30 minutes every day and some zumba; working   Progress Towards Goal(s):  In  progress.  Handouts given during visit include:  NS veggies + protein   Nutritional Diagnosis:  [place]-3.3 Overweight/obesity related to past poor dietary habits and physical inactivity as evidenced by patient w/ recent YRGBsurgery following dietary guidelines for continued weight loss.    Intervention:  Nutrition counseling. Goals: -ou can add starchy vegetables, fruit, tortilla -do not sacrifice your protein and vegetables for the carbohydrate group   Teaching Method Utilized:  Visual Auditory Hands on Barriers to learning/adherence to lifestyle change: none identified   Demonstrated degree of understanding via:  Teach Back   Monitoring/Evaluation:  Dietary intake, exercise,  and body weight.

## 2017-09-11 ENCOUNTER — Ambulatory Visit: Payer: Managed Care, Other (non HMO) | Admitting: Family Medicine

## 2017-09-11 ENCOUNTER — Other Ambulatory Visit: Payer: Self-pay

## 2017-09-11 ENCOUNTER — Encounter: Payer: Self-pay | Admitting: Family Medicine

## 2017-09-11 VITALS — BP 130/82 | HR 69 | Temp 98.0°F | Resp 16 | Ht 63.39 in | Wt 214.0 lb

## 2017-09-11 DIAGNOSIS — R1013 Epigastric pain: Secondary | ICD-10-CM | POA: Diagnosis not present

## 2017-09-11 DIAGNOSIS — E890 Postprocedural hypothyroidism: Secondary | ICD-10-CM | POA: Diagnosis not present

## 2017-09-11 DIAGNOSIS — D62 Acute posthemorrhagic anemia: Secondary | ICD-10-CM

## 2017-09-11 DIAGNOSIS — Z23 Encounter for immunization: Secondary | ICD-10-CM | POA: Diagnosis not present

## 2017-09-11 DIAGNOSIS — K219 Gastro-esophageal reflux disease without esophagitis: Secondary | ICD-10-CM

## 2017-09-11 DIAGNOSIS — Z9884 Bariatric surgery status: Secondary | ICD-10-CM | POA: Diagnosis not present

## 2017-09-11 DIAGNOSIS — K912 Postsurgical malabsorption, not elsewhere classified: Secondary | ICD-10-CM | POA: Diagnosis not present

## 2017-09-11 DIAGNOSIS — L304 Erythema intertrigo: Secondary | ICD-10-CM | POA: Diagnosis not present

## 2017-09-11 DIAGNOSIS — R69 Illness, unspecified: Secondary | ICD-10-CM | POA: Diagnosis not present

## 2017-09-11 DIAGNOSIS — E669 Obesity, unspecified: Secondary | ICD-10-CM | POA: Diagnosis not present

## 2017-09-11 LAB — POCT URINALYSIS DIP (MANUAL ENTRY)
BILIRUBIN UA: NEGATIVE
BILIRUBIN UA: NEGATIVE mg/dL
Blood, UA: NEGATIVE
GLUCOSE UA: NEGATIVE mg/dL
Leukocytes, UA: NEGATIVE
Nitrite, UA: NEGATIVE
Protein Ur, POC: NEGATIVE mg/dL
SPEC GRAV UA: 1.015 (ref 1.010–1.025)
Urobilinogen, UA: 0.2 E.U./dL
pH, UA: 6.5 (ref 5.0–8.0)

## 2017-09-11 NOTE — Progress Notes (Signed)
Subjective:    Patient ID: Wilhemina Bonito, female    DOB: Apr 24, 1976, 41 y.o.   MRN: [id number]  09/11/2017  Back Pain (middle of the back x 1 week ) and Rash (below the stomach)    HPI This 41 y.o. female presents for evaluation of abdominal pain.  Saw general surgeon at 1:45pm today to address stomach pain that has occurred intermittently since gastric bypass; sending for xrays for abdominal pain.  If negative, will perform EGD.  Sent for labs.    Rash: still suffering with rash along pannus.  HR recommended getting FMLA for chronic medical condition.  Having epigastric pain and for recurrent painful rash.  Abdominal pain  Occurs 1-2 times per week.  Working 120-138 hours every 2 weeks.   Lifts 60 pounds at work and holds weight on abdomen.  Pain is always at epigastric region.  Levothyroxine causes stomach pain.  Ate one tortilla with one taco.  Fresh onions.  Cilantro.  Pain with eating.  Must wait one hour after eating to drink water. Not vomiting food; vomiting water.  Pain with water or food intake.  Constant epigastric pain; severe pain with eating.  PowerAde Zero less painful.    BP Readings from Last 3 Encounters:  09/11/17 130/82  06/26/17 134/86  05/02/17 (!) 148/87   Wt Readings from Last 3 Encounters:  09/11/17 214 lb (97.1 kg)  08/21/17 212 lb 9.6 oz (96.4 kg)  06/26/17 225 lb (102.1 kg)   Immunization History  Administered Date(s) Administered  . Influenza,inj,Quad PF,6+ Mos 06/29/2015, 07/24/2016  . Pneumococcal Polysaccharide-23 06/29/2015  . Tdap 06/29/2015    Review of Systems  Constitutional: Negative for chills, diaphoresis, fatigue and fever.  Eyes: Negative for visual disturbance.  Respiratory: Negative for cough and shortness of breath.   Cardiovascular: Negative for chest pain, palpitations and leg swelling.  Gastrointestinal: Positive for abdominal pain. Negative for abdominal distention, anal bleeding, blood in stool, constipation, diarrhea,  nausea and vomiting.  Endocrine: Negative for cold intolerance, heat intolerance, polydipsia, polyphagia and polyuria.  Skin: Positive for color change and rash.  Neurological: Negative for dizziness, tremors, seizures, syncope, facial asymmetry, speech difficulty, weakness, light-headedness, numbness and headaches.    Past Medical History:  Diagnosis Date  . Carpal tunnel syndrome, bilateral   . Depression   . GERD (gastroesophageal reflux disease)   . H. pylori infection    dx  09-02-2014-- treated w/ antibiotic  . Hypertension    recently low pressures   . Irregular menstrual cycle   . Menorrhagia   . PCOS (polycystic ovarian syndrome)   . Type 2 diabetes mellitus ([place])    Past Surgical History:  Procedure Laterality Date  . CARPAL TUNNEL RELEASE Bilateral 10/07/2014   Procedure: CARPAL TUNNEL RELEASE BILATERAL;  Surgeon: [name], MD;  Location: [facility];  Service: Orthopedics;  Laterality: Bilateral;  . CESAREAN SECTION  1995  . GASTRIC ROUX-EN-Y N/A 03/12/2017   Procedure: LAPAROSCOPIC ROUX-EN-Y GASTRIC BYPASS WITH UPPER ENDOSCOPY;  Surgeon: Kieth Brightly, Arta Bruce, MD;  Location: [facility];  Service: General;  Laterality: N/A;  . UPPER GI ENDOSCOPY  03/12/2017   Procedure: UPPER GI ENDOSCOPY;  Surgeon: Kieth Brightly Arta Bruce, MD;  Location: [facility];  Service: General;;   Allergies  Allergen Reactions  . Food     RAW/FRESH FRUIT--PEACHES, PEARS, APPLES=THROAT CLOSES/SWELLING & COUGHING SHORTNESS OF BREATH  . 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 Shrimp [Shellfish Allergy] Anaphylaxis   Current Outpatient Medications on File  Prior to Visit  Medication Sig Dispense Refill  . clindamycin (CLINDAGEL) 1 % gel Apply topically 2 (two) times daily. To face. 30 g 1  . desonide (DESOWEN) 0.05 % cream Apply topically 2 (two) times daily. 15 g 0  . doxycycline (VIBRA-TABS) 100 MG tablet Take 1 tablet (100 mg total) by mouth 2 (two) times daily. 60 tablet 5  .  ferrous sulfate 300 (60 Fe) MG/5ML syrup Take 5 mLs (300 mg total) by mouth daily. 150 mL 3  . glucose blood (FREESTYLE TEST STRIPS) test strip Please fill with preferred strip and check sugar once daily 100 each 4  . hydrocortisone (ANUSOL-HC) 25 MG suppository Place 1 suppository (25 mg total) rectally 2 (two) times daily. 12 suppository 0  . levothyroxine (SYNTHROID, LEVOTHROID) 100 MCG tablet Take 1 tablet (100 mcg total) by mouth daily. 90 tablet 3  . loratadine (CLARITIN) 10 MG tablet Take 1 tablet (10 mg total) by mouth daily. 90 tablet 3  . nystatin (NYSTATIN) powder Apply 1 g topically 2 (two) times daily. 56 g 2  . nystatin cream (MYCOSTATIN) Apply 1 application topically 2 (two) times daily. 30 g 5  . Olopatadine HCl 0.2 % SOLN Apply 1 drop to eye daily. (Patient taking differently: Apply 1 drop to eye daily as needed (for conjuctivitis). ) 2.5 mL 2  . pantoprazole (PROTONIX) 40 MG tablet Take 1 tablet (40 mg total) by mouth daily. 30 tablet 1   No current facility-administered medications on file prior to visit.    Social History   Socioeconomic History  . Marital status: Married    Spouse name: Not on file  . Number of children: 2  . Years of education: Not on file  . Highest education level: Not on file  Social Needs  . Financial resource strain: Not on file  . Food insecurity - worry: Not on file  . Food insecurity - inability: Not on file  . Transportation needs - medical: Not on file  . Transportation needs - non-medical: Not on file  Occupational History  . Not on file  Tobacco Use  . Smoking status: Never Smoker  . Smokeless tobacco: Never Used  Substance and Sexual Activity  . Alcohol use: Yes    Alcohol/week: 0.0 oz    Comment: socially  . Drug use: No  . Sexual activity: Yes    Partners: Male    Birth control/protection: None  Other Topics Concern  . Not on file  Social History Narrative   Marital status: married x 1 year; from [place].  Moved to [place]  since 2000 from [place]; born in [place].      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 Heart disease Mother 82       CAD  . Diabetes Mother   . Hypertension Mother   . Cancer Father 42       prostate cancer  . Diabetes Father   . Thyroid disease Neg Hx        Objective:  BP 130/82   Pulse 69   Temp 98 F (36.7 C) (Oral)   Resp 16   Ht 5' 3.39" (1.61 m)   Wt 214 lb (97.1 kg)   LMP  (LMP Unknown)   SpO2 97%   BMI 37.45 kg/m  Physical Exam  Constitutional: She is oriented to person, place, and time. She appears well-developed and well-nourished. No distress.  HENT:  Head: Normocephalic and atraumatic.  Right Ear: External ear normal.  Left Ear: External ear normal.  Nose: Nose normal.  Mouth/Throat: Oropharynx is clear and moist.  Eyes: Conjunctivae and EOM are normal. Pupils are equal, round, and reactive to light.  Neck: Normal range of motion. Neck supple. Carotid bruit is not present. No thyromegaly present.  Cardiovascular: Normal rate, regular rhythm, normal heart sounds and intact distal pulses. Exam reveals no gallop and no friction rub.  No murmur heard. Pulmonary/Chest: Effort normal and breath sounds normal. She has no wheezes. She has no rales.  Abdominal: Soft. Bowel sounds are normal. She exhibits no distension and no mass. There is no hepatosplenomegaly. There is tenderness in the epigastric area. There is no rebound, no guarding and no CVA tenderness. No hernia.  Lymphadenopathy:    She has no cervical adenopathy.  Neurological: She is alert and oriented to person, place, and time. No cranial nerve deficit.  Skin: Skin is warm and dry. No rash noted. She is  not diaphoretic. No erythema. No pallor.     Diffusely erythematous rash along skin folds at inguinal regions B.  Defined border.  No drainage.  Psychiatric: She has a normal mood and affect. Her behavior is normal.   No results found. Depression screen [facility] 2/9 09/11/2017 06/26/2017 04/29/2017 04/03/2017 03/06/2017  Decreased Interest 0 0 0 0 0  Down, Depressed, Hopeless 0 0 0 0 0  PHQ - 2 Score 0 0 0 0 0  Altered sleeping - - - - -  Tired, decreased energy - - - - -  Change in appetite - - - - -  Feeling bad or failure about yourself  - - - - -  Trouble concentrating - - - - -  Moving slowly or fidgety/restless - - - - -  Suicidal thoughts - - - - -  PHQ-9 Score - - - - -  Some recent data might be hidden   Fall Risk  09/11/2017 06/26/2017 04/29/2017 04/03/2017 03/06/2017  Falls in the past year? No No No No No  Number falls in past yr: - - - - -  Injury with [place]:   1. Abdominal pain, epigastric   2. Intertrigo   3. Gastroesophageal reflux disease without esophagitis   4. S/P gastric bypass   5. Postoperative anemia due to acute blood loss   6. Postablative hypothyroidism   7. Need for prophylactic vaccination and inoculation against influenza     New onset epigastric abdominal pain since gastric bypass.  Follow-up with general surgeon today.  Scheduling imaging studies as well as blood work.  Continue proton pump inhibitor daily as prescribed.  Avoid high spicy foods or high acid foods.  Consider Carafate if persist.  Suffers with ongoing intertrigo due to skin folds with rapid weight loss.  Continue current treatment with antifungals and antifungal powder and creams.  Recommend applying antiperspirant to area to prevent sweating and moisture in those areas.  Rash is actually mild today but will acutely worsen with increased heat and humidity.  Uncontrolled hypothyroidism at last visit.  Will repeat thyroid levels today.   Noncompliance with levothyroxine as patient feels that levothyroxine causes worsening abdominal pain.  Status post flu vaccine in office today.  Orders Placed This Encounter  Procedures  . TSH  . T4, free  . POCT urinalysis dipstick   No orders of the defined types were placed in this encounter.   Return in about 4 weeks (around 10/09/2017) for follow-up chronic medical conditions.   Tariya Morrissette Elayne Guerin, M.D. Primary Care at [facility] previously [facility] [place] [address] [place], [place]  [address] [phone] phone [phone] fax

## 2017-09-11 NOTE — Patient Instructions (Signed)
     IF you received an x-ray today, you will receive an invoice from [facility]. Please contact [facility] at [phone] with questions or concerns regarding your invoice.   IF you received labwork today, you will receive an invoice from [facility]. Please contact [facility] at [phone] with questions or concerns regarding your invoice.   Our billing staff will not be able to assist you with questions regarding bills from these companies.  You will be contacted with the lab results as soon as they are available. The fastest way to get your results is to activate your My Chart account. Instructions are located on the last page of this paperwork. If you have not heard from us regarding the results in 2 weeks, please contact this office.     

## 2017-09-12 LAB — T4, FREE: Free T4: 0.39 ng/dL — ABNORMAL LOW (ref 0.82–1.77)

## 2017-09-12 LAB — TSH: TSH: 75.97 u[IU]/mL — ABNORMAL HIGH (ref 0.450–4.500)

## 2017-09-26 ENCOUNTER — Other Ambulatory Visit: Payer: Self-pay | Admitting: Family Medicine

## 2017-09-30 ENCOUNTER — Ambulatory Visit: Payer: Managed Care, Other (non HMO) | Admitting: Family Medicine

## 2017-10-14 ENCOUNTER — Ambulatory Visit (INDEPENDENT_AMBULATORY_CARE_PROVIDER_SITE_OTHER): Payer: Managed Care, Other (non HMO) | Admitting: Family Medicine

## 2017-10-14 ENCOUNTER — Encounter: Payer: Self-pay | Admitting: Family Medicine

## 2017-10-14 ENCOUNTER — Other Ambulatory Visit: Payer: Self-pay

## 2017-10-14 VITALS — BP 122/70 | HR 78 | Temp 98.4°F | Resp 16 | Ht 61.81 in | Wt 212.0 lb

## 2017-10-14 DIAGNOSIS — F329 Major depressive disorder, single episode, unspecified: Secondary | ICD-10-CM

## 2017-10-14 DIAGNOSIS — F419 Anxiety disorder, unspecified: Secondary | ICD-10-CM

## 2017-10-14 DIAGNOSIS — H35352 Cystoid macular degeneration, left eye: Secondary | ICD-10-CM | POA: Diagnosis not present

## 2017-10-14 DIAGNOSIS — E119 Type 2 diabetes mellitus without complications: Secondary | ICD-10-CM | POA: Diagnosis not present

## 2017-10-14 DIAGNOSIS — Z23 Encounter for immunization: Secondary | ICD-10-CM | POA: Diagnosis not present

## 2017-10-14 DIAGNOSIS — R1013 Epigastric pain: Secondary | ICD-10-CM

## 2017-10-14 DIAGNOSIS — H40013 Open angle with borderline findings, low risk, bilateral: Secondary | ICD-10-CM | POA: Diagnosis not present

## 2017-10-14 DIAGNOSIS — L304 Erythema intertrigo: Secondary | ICD-10-CM

## 2017-10-14 DIAGNOSIS — E890 Postprocedural hypothyroidism: Secondary | ICD-10-CM

## 2017-10-14 DIAGNOSIS — H25013 Cortical age-related cataract, bilateral: Secondary | ICD-10-CM | POA: Diagnosis not present

## 2017-10-14 DIAGNOSIS — Z9884 Bariatric surgery status: Secondary | ICD-10-CM | POA: Diagnosis not present

## 2017-10-14 DIAGNOSIS — F32A Depression, unspecified: Secondary | ICD-10-CM

## 2017-10-14 LAB — POCT URINE PREGNANCY: PREG TEST UR: NEGATIVE

## 2017-10-14 MED ORDER — FLUOXETINE HCL 20 MG PO TABS: 20.0000 mg | ORAL_TABLET | Freq: Every day | ORAL | 1 refills | Status: DC

## 2017-10-14 NOTE — Patient Instructions (Signed)
     IF you received an x-ray today, you will receive an invoice from [facility]. Please contact [facility] at [phone] with questions or concerns regarding your invoice.   IF you received labwork today, you will receive an invoice from [facility]. Please contact [facility] at [phone] with questions or concerns regarding your invoice.   Our billing staff will not be able to assist you with questions regarding bills from these companies.  You will be contacted with the lab results as soon as they are available. The fastest way to get your results is to activate your My Chart account. Instructions are located on the last page of this paperwork. If you have not heard from us regarding the results in 2 weeks, please contact this office.     

## 2017-10-14 NOTE — Progress Notes (Signed)
Subjective:    Patient ID: [name], female    DOB: 1976/05/09, 41 y.o.   MRN: [id number]  10/14/2017  Abdominal Pain (1 month follow-up pt states it still feels the same)    HPI This 41 y.o. female presents for evaluation of ongoing abdominal pain s/p gastric bypass, intertrigo, and hypothyroidism post-ablative.  Management changes made at last visit include:  -New onset epigastric abdominal pain since gastric bypass.  Follow-up with general surgeon today.  Scheduling imaging studies as well as blood work.  Continue proton pump inhibitor daily as prescribed.  Avoid high spicy foods or high acid foods.  Consider Carafate if persist. -Suffers with ongoing intertrigo due to skin folds with rapid weight loss.   -Continue current treatment with antifungals and antifungal powder and creams.  Recommend applying antiperspirant to area to prevent sweating and moisture in those areas.  Rash is actually mild today but will acutely worsen with increased heat and humidity. -Uncontrolled hypothyroidism at last visit.  Will repeat thyroid levels today.  Noncompliance with levothyroxine as patient feels that levothyroxine causes worsening abdominal pain.  S/p eye exam today; cannot read; very blurry vision.   Not taking levothyroxine due to worsening abdominal pain; has medication.  Horrible belching all the time. Eyes watering excessively if wears eye makeup.  Applying antiperspirant to rash has improved rash however rash continues.  Continues to suffer with epigastric pain every time she eats.  Not currently taking Protonix or Prilosec therapy.  Discontinued therapy after initial surgery.  Denies vomiting daily.  Denies constipation or diarrhea.  Denies melena or bloody stools.  BP Readings from Last 3 Encounters:  11/04/17 (!) 142/84  10/14/17 122/70  09/11/17 130/82   Wt Readings from Last 3 Encounters:  11/04/17 210 lb 3.2 oz (95.3 kg)  10/14/17 212 lb (96.2 kg)  09/11/17 214  lb (97.1 kg)   Immunization History  Administered Date(s) Administered  . Influenza Split 06/26/2017  . Influenza,inj,Quad PF,6+ Mos 06/29/2015, 07/24/2016, 10/14/2017  . Pneumococcal Polysaccharide-23 06/29/2015  . Tdap 06/29/2015    Review of Systems  Constitutional: Negative for chills, diaphoresis, fatigue and fever.  Eyes: Negative for visual disturbance.  Respiratory: Negative for cough and shortness of breath.   Cardiovascular: Negative for chest pain, palpitations and leg swelling.  Gastrointestinal: Positive for abdominal pain. Negative for abdominal distention, anal bleeding, blood in stool, constipation, diarrhea, nausea, rectal pain and vomiting.  Endocrine: Negative for cold intolerance, heat intolerance, polydipsia, polyphagia and polyuria.  Skin: Positive for rash.  Neurological: Negative for dizziness, tremors, seizures, syncope, facial asymmetry, speech difficulty, weakness, light-headedness, numbness and headaches.    Past Medical History:  Diagnosis Date  . Carpal tunnel syndrome, bilateral   . Depression   . GERD (gastroesophageal reflux disease)   . H. pylori infection    dx  09-02-2014-- treated w/ antibiotic  . Hypertension    recently low pressures   . Irregular menstrual cycle   . Menorrhagia   . PCOS (polycystic ovarian syndrome)   . Type 2 diabetes mellitus ([place])    Past Surgical History:  Procedure Laterality Date  . CARPAL TUNNEL RELEASE Bilateral 10/07/2014   Procedure: CARPAL TUNNEL RELEASE BILATERAL;  Surgeon: [name], MD;  Location: [facility];  Service: Orthopedics;  Laterality: Bilateral;  . CESAREAN SECTION  1995  . GASTRIC ROUX-EN-Y N/A 03/12/2017   Procedure: LAPAROSCOPIC ROUX-EN-Y GASTRIC BYPASS WITH UPPER ENDOSCOPY;  Surgeon: Kieth Brightly, Arta Bruce, MD;  Location: [facility];  Service:  General;  Laterality: N/A;  . UPPER GI ENDOSCOPY  03/12/2017   Procedure: UPPER GI ENDOSCOPY;  Surgeon: Kieth Brightly, Arta Bruce, MD;   Location: [facility];  Service: General;;   Allergies  Allergen Reactions  . Food     RAW/FRESH FRUIT--PEACHES, PEARS, APPLES=THROAT CLOSES/SWELLING & COUGHING SHORTNESS OF BREATH  . 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 Shrimp [Shellfish Allergy] Anaphylaxis   Current Outpatient Medications on File Prior to Visit  Medication Sig Dispense Refill  . clindamycin (CLINDAGEL) 1 % gel Apply topically 2 (two) times daily. To face. 30 g 1  . desonide (DESOWEN) 0.05 % cream APPLY  CREAM TOPICALLY TWICE DAILY 15 g 0  . glucose blood (FREESTYLE TEST STRIPS) test strip Please fill with preferred strip and check sugar once daily 100 each 4  . hydrocortisone (ANUSOL-HC) 25 MG suppository Place 1 suppository (25 mg total) rectally 2 (two) times daily. 12 suppository 0  . levothyroxine (SYNTHROID, LEVOTHROID) 100 MCG tablet Take 1 tablet (100 mcg total) by mouth daily. 90 tablet 3  . nystatin (NYSTATIN) powder Apply 1 g topically 2 (two) times daily. 56 g 2  . nystatin cream (MYCOSTATIN) Apply 1 application topically 2 (two) times daily. 30 g 5  . NYSTATIN powder APPLY 1 GRAM TOPICALLY TWO TIMES DAILY 1 Bottle 1  . Olopatadine HCl 0.2 % SOLN Apply 1 drop to eye daily. (Patient taking differently: Apply 1 drop to eye daily as needed (for conjuctivitis). ) 2.5 mL 2  . pantoprazole (PROTONIX) 40 MG tablet Take 1 tablet (40 mg total) by mouth daily. 30 tablet 1   No current facility-administered medications on file prior to visit.    Social History   Socioeconomic History  . Marital status: Married    Spouse name: Not on file  . Number of children: 2  . Years of education: Not on file  . Highest education level: Not on file  Social Needs  . Financial resource strain: Not on file  . Food insecurity - worry: Not on file  . Food insecurity - inability: Not on file  . Transportation needs - medical: Not on file  . Transportation needs - non-medical: Not on file  Occupational History  . Not on file    Tobacco Use  . Smoking status: Never Smoker  . Smokeless tobacco: Never Used  Substance and Sexual Activity  . Alcohol use: Yes    Alcohol/week: 0.0 oz    Comment: socially  . Drug use: No  . Sexual activity: Yes    Partners: Male    Birth control/protection: None  Other Topics Concern  . Not on file  Social History Narrative   Marital status: married x 1 year; from [place].  Moved to [place] since 2000 from [place]; born in [place].      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        Employment:  [employer] full time;       Tobacco: none      Alcohol: social      Drugs: none      Exercise: none   No reported caffeine use    Family History  Problem Relation Age of Onset  . Heart disease Mother 88       CAD  . Diabetes Mother   . Hypertension Mother   . Cancer Father 33  prostate cancer  . Diabetes Father   . Thyroid disease Neg Hx        Objective:    BP 122/70   Pulse 78   Temp 98.4 F (36.9 C) (Oral)   Resp 16   Ht 5' 1.81" (1.57 m)   Wt 212 lb (96.2 kg)   LMP  (LMP Unknown)   SpO2 96%   BMI 39.01 kg/m  Physical Exam  Constitutional: She is oriented to person, place, and time. She appears well-developed and well-nourished. No distress.  HENT:  Head: Normocephalic and atraumatic.  Right Ear: External ear normal.  Left Ear: External ear normal.  Nose: Nose normal.  Mouth/Throat: Oropharynx is clear and moist.  Eyes: Conjunctivae and EOM are normal. Pupils are equal, round, and reactive to light.  Neck: Normal range of motion. Neck supple. Carotid bruit is not present. No thyromegaly present.  Cardiovascular: Normal rate, regular rhythm, normal heart sounds and intact distal pulses. Exam reveals no gallop and no friction rub.  No murmur heard. Pulmonary/Chest: Effort normal and breath sounds normal. She has no wheezes. She has no rales.  Abdominal: Soft.  Bowel sounds are normal. She exhibits no distension and no mass. There is tenderness in the epigastric area. There is no rebound and no guarding. No hernia.  Lymphadenopathy:    She has no cervical adenopathy.  Neurological: She is alert and oriented to person, place, and time. No cranial nerve deficit.  Skin: Skin is warm and dry. No rash noted. She is not diaphoretic. No erythema. No pallor.     Mild erythematous rash B inguinal folds and under pannus.   Psychiatric: She has a normal mood and affect. Her behavior is normal.   No results found. Depression screen [facility] 2/9 10/14/2017 09/11/2017 06/26/2017 04/29/2017 04/03/2017  Decreased Interest 0 0 0 0 0  Down, Depressed, Hopeless 0 0 0 0 0  PHQ - 2 Score 0 0 0 0 0  Altered sleeping - - - - -  Tired, decreased energy - - - - -  Change in appetite - - - - -  Feeling bad or failure about yourself  - - - - -  Trouble concentrating - - - - -  Moving slowly or fidgety/restless - - - - -  Suicidal thoughts - - - - -  PHQ-9 Score - - - - -  Some recent data might be hidden   Fall Risk  11/04/2017 10/14/2017 09/11/2017 06/26/2017 04/29/2017  Falls in the past year? No No No No No  Number falls in past yr: - - - - -  Injury with [place]:   1. Epigastric pain   2. Intertrigo   3. S/P gastric bypass   4. Postablative hypothyroidism   5. Anxiety and depression   6. Need for prophylactic vaccination and inoculation against influenza    Persistent epigastric pain with nausea and intermittent vomiting.  Associated with eating or any p.o. intake including water intake.  Recommend restarting Protonix therapy daily.  Improving intertrigo with applying topical antiperspirant.  Continue antiperspirant daily.  Use antifungal therapy and topical steroid therapies indicated.  Secondary to redundant tissue secondary to aggressive weight loss.  Uncontrolled hypothyroidism post ablative due to  noncompliance with Synthroid supplementation.  Educated patient today that poorly controlled hypothyroidism can prevent further weight loss.  Patient will rechallenge with Synthroid therapy.  Consider switching to name brand Synthroid to see if side effects are less.  Recurrent anxiety and depression due to marital strain.  Husband desires to have a child however patient does not  and feels pressure from husband to conceive.  Consider psychotherapy.  Initiate Prozac 20 mg daily.  May warrant marital counseling.  Status post influenza vaccine during visit.  Orders Placed This Encounter  Procedures  . Flu Vaccine QUAD 36+ mos IM  . POCT urine pregnancy   Meds ordered this encounter  Medications  . FLUoxetine (PROZAC) 20 MG tablet    Sig: Take 1 tablet (20 mg total) by mouth daily.    Dispense:  90 tablet    Refill:  1    Return in about 4 weeks (around 11/11/2017) for recheck.   Isatou Agredano Elayne Guerin, M.D. Primary Care at [facility] previously [facility] [place] [address] [place], [place]  [address] [phone] phone [phone] fax

## 2017-10-23 ENCOUNTER — Other Ambulatory Visit: Payer: Self-pay | Admitting: General Surgery

## 2017-10-23 DIAGNOSIS — E6609 Other obesity due to excess calories: Secondary | ICD-10-CM

## 2017-10-25 ENCOUNTER — Other Ambulatory Visit: Payer: Self-pay | Admitting: General Surgery

## 2017-10-25 ENCOUNTER — Ambulatory Visit
Admission: RE | Admit: 2017-10-25 | Discharge: 2017-10-25 | Disposition: A | Discharge status: Home / Self Care | Payer: BLUE CROSS/BLUE SHIELD | Source: Ambulatory Visit | Attending: General Surgery | Admitting: General Surgery

## 2017-10-25 DIAGNOSIS — E6609 Other obesity due to excess calories: Secondary | ICD-10-CM

## 2017-10-25 DIAGNOSIS — K224 Dyskinesia of esophagus: Secondary | ICD-10-CM | POA: Diagnosis not present

## 2017-11-01 ENCOUNTER — Telehealth: Payer: Self-pay | Admitting: Family Medicine

## 2017-11-01 NOTE — Telephone Encounter (Signed)
Copied from [place] [phone]. Topic: Quick Communication - See Telephone Encounter >> Nov 01, 2017  3:58 PM Robina Ade, Helene Kelp D wrote: CRM for notification. See Telephone encounter for: 11/01/17. Patient called and would like to know if paperwork for FMLA were filed out and sent. Please call patient back, thanks.

## 2017-11-04 ENCOUNTER — Ambulatory Visit (INDEPENDENT_AMBULATORY_CARE_PROVIDER_SITE_OTHER): Payer: BLUE CROSS/BLUE SHIELD | Admitting: Family Medicine

## 2017-11-04 ENCOUNTER — Encounter: Payer: Self-pay | Admitting: Family Medicine

## 2017-11-04 ENCOUNTER — Other Ambulatory Visit: Payer: Self-pay

## 2017-11-04 VITALS — BP 142/84 | HR 73 | Temp 98.6°F | Resp 17 | Ht 61.81 in | Wt 210.2 lb

## 2017-11-04 DIAGNOSIS — J0120 Acute ethmoidal sinusitis, unspecified: Secondary | ICD-10-CM

## 2017-11-04 MED ORDER — LORATADINE 10 MG PO TABS: 10.0000 mg | ORAL_TABLET | Freq: Every day | ORAL | 3 refills | Status: DC

## 2017-11-04 MED ORDER — AMOXICILLIN-POT CLAVULANATE 875-125 MG PO TABS: 1.0000 | ORAL_TABLET | Freq: Two times a day (BID) | ORAL | 0 refills | Status: DC

## 2017-11-04 MED ORDER — FLUTICASONE PROPIONATE 50 MCG/ACT NA SUSP: 2.0000 | Freq: Every day | NASAL | 6 refills | Status: DC

## 2017-11-04 NOTE — Progress Notes (Signed)
Chief Complaint  Patient presents with  . Facial Pain    x 3 days, under left eye and radiates down into face, not taking any pain medication for pain, no other symptoms present    HPI  Sinus Pain: Patient complains of nasal congestion and sinus pressure. Symptoms include left side nasal congestion with left side facial pressure with pressure next to the eyes, down the face to the teeth and a throbbing sensation with no fever, chills, night sweats or weight loss. Onset of symptoms was 3 days ago, gradually worsening since that time. She is drinking plenty of fluids.  Past history is significant for no history of pneumonia or bronchitis. Patient is non-smoker   Past Medical History:  Diagnosis Date  . Carpal tunnel syndrome, bilateral   . Depression   . GERD (gastroesophageal reflux disease)   . H. pylori infection    dx  09-02-2014-- treated w/ antibiotic  . Hypertension    recently low pressures   . Irregular menstrual cycle   . Menorrhagia   . PCOS (polycystic ovarian syndrome)   . Type 2 diabetes mellitus ([place])     Current Outpatient Medications  Medication Sig Dispense Refill  . clindamycin (CLINDAGEL) 1 % gel Apply topically 2 (two) times daily. To face. 30 g 1  . desonide (DESOWEN) 0.05 % cream APPLY  CREAM TOPICALLY TWICE DAILY 15 g 0  . amoxicillin-clavulanate (AUGMENTIN) 875-125 MG tablet Take 1 tablet by mouth 2 (two) times daily. 20 tablet 0  . FLUoxetine (PROZAC) 20 MG tablet Take 1 tablet (20 mg total) by mouth daily. 90 tablet 1  . fluticasone (FLONASE) 50 MCG/ACT nasal spray Place 2 sprays into both nostrils daily. 16 g 6  . glucose blood (FREESTYLE TEST STRIPS) test strip Please fill with preferred strip and check sugar once daily 100 each 4  . hydrocortisone (ANUSOL-HC) 25 MG suppository Place 1 suppository (25 mg total) rectally 2 (two) times daily. 12 suppository 0  . levothyroxine (SYNTHROID, LEVOTHROID) 100 MCG tablet Take 1 tablet (100 mcg total) by mouth  daily. 90 tablet 3  . loratadine (CLARITIN) 10 MG tablet Take 1 tablet (10 mg total) by mouth daily. 90 tablet 3  . nystatin (NYSTATIN) powder Apply 1 g topically 2 (two) times daily. 56 g 2  . nystatin cream (MYCOSTATIN) Apply 1 application topically 2 (two) times daily. 30 g 5  . NYSTATIN powder APPLY 1 GRAM TOPICALLY TWO TIMES DAILY 1 Bottle 1  . Olopatadine HCl 0.2 % SOLN Apply 1 drop to eye daily. (Patient taking differently: Apply 1 drop to eye daily as needed (for conjuctivitis). ) 2.5 mL 2  . pantoprazole (PROTONIX) 40 MG tablet Take 1 tablet (40 mg total) by mouth daily. 30 tablet 1   No current facility-administered medications for this visit.     Allergies:  Allergies  Allergen Reactions  . Food     RAW/FRESH FRUIT--PEACHES, PEARS, APPLES=THROAT CLOSES/SWELLING & COUGHING SHORTNESS OF BREATH  . 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 Shrimp [Shellfish Allergy] Anaphylaxis    Past Surgical History:  Procedure Laterality Date  . CARPAL TUNNEL RELEASE Bilateral 10/07/2014   Procedure: CARPAL TUNNEL RELEASE BILATERAL;  Surgeon: [name], MD;  Location: [facility];  Service: Orthopedics;  Laterality: Bilateral;  . CESAREAN SECTION  1995  . GASTRIC ROUX-EN-Y N/A 03/12/2017   Procedure: LAPAROSCOPIC ROUX-EN-Y GASTRIC BYPASS WITH UPPER ENDOSCOPY;  Surgeon: Kieth Brightly, Arta Bruce, MD;  Location: [facility];  Service: General;  Laterality:  N/A;  . UPPER GI ENDOSCOPY  03/12/2017   Procedure: UPPER GI ENDOSCOPY;  Surgeon: Kieth Brightly, Arta Bruce, MD;  Location: [facility];  Service: General;;    Social History   Socioeconomic History  . Marital status: Married    Spouse name: None  . Number of children: 2  . Years of education: None  . Highest education level: None  Social Needs  . Financial resource strain: None  . Food insecurity - worry: None  . Food insecurity - inability: None  . Transportation needs - medical: None  . Transportation needs - non-medical: None    Occupational History  . None  Tobacco Use  . Smoking status: Never Smoker  . Smokeless tobacco: Never Used  Substance and Sexual Activity  . Alcohol use: Yes    Alcohol/week: 0.0 oz    Comment: socially  . Drug use: No  . Sexual activity: Yes    Partners: Male    Birth control/protection: None  Other Topics Concern  . None  Social History Narrative   Marital status: married x 1 year; from [place].  Moved to [place] since 2000 from [place]; born in [place].      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 Heart disease Mother 45       CAD  . Diabetes Mother   . Hypertension Mother   . Cancer Father 44       prostate cancer  . Diabetes Father   . Thyroid disease Neg Hx      ROS Review of Systems See HPI Constitution: No fevers or chills No malaise No diaphoresis Skin: No rash or itching Eyes: no blurry vision, no double vision GU: no dysuria or hematuria Neuro: no dizziness or headaches all others reviewed and negative   Objective: Vitals:   11/04/17 1610  BP: (!) 142/84  Pulse: 73  Resp: 17  Temp: 98.6 F (37 C)  TempSrc: Oral  SpO2: 100%  Weight: 210 lb 3.2 oz (95.3 kg)  Height: 5' 1.81" (1.57 m)    Physical Exam General: alert, oriented, in NAD Head: normocephalic, atraumatic, +left side ethmoid and maxillary sinus tenderness Eyes: EOM intact, no scleral icterus or conjunctival injection Ears: TM clear bilaterally Nose: mucosa nonerythematous, nonedematous Throat: no pharyngeal exudate or erythema Lymph: no posterior auricular, submental or cervical lymph adenopathy Heart: normal rate, normal sinus rhythm, no murmurs Lungs: clear to auscultation  bilaterally, no wheezing   Assessment and Plan Mandee was seen today for facial pain.  Diagnoses and all orders for this visit:  Acute non-recurrent ethmoidal sinusitis  Pt given home treatment plan No steroids at this time Advised supportive care -     amoxicillin-clavulanate (AUGMENTIN) 875-125 MG tablet; Take 1 tablet by mouth 2 (two) times daily. -     loratadine (CLARITIN) 10 MG tablet; Take 1 tablet (10 mg total) by mouth daily. -     fluticasone (FLONASE) 50 MCG/ACT nasal spray; Place 2 sprays into both nostrils daily.     Ethanjames Fontenot A  Nolon Rod

## 2017-11-04 NOTE — Patient Instructions (Addendum)
   IF you received an x-ray today, you will receive an invoice from [facility]. Please contact  Radiology at [phone] with questions or concerns regarding your invoice.   IF you received labwork today, you will receive an invoice from [facility]. Please contact [facility] at [phone] with questions or concerns regarding your invoice.   Our billing staff will not be able to assist you with questions regarding bills from these companies.  You will be contacted with the lab results as soon as they are available. The fastest way to get your results is to activate your My Chart account. Instructions are located on the last page of this paperwork. If you have not heard from us regarding the results in 2 weeks, please contact this office.      Sinusitis, Adult Sinusitis is soreness and inflammation of your sinuses. Sinuses are hollow spaces in the bones around your face. Your sinuses are located:  Around your eyes.  In the middle of your forehead.  Behind your nose.  In your cheekbones.  Your sinuses and nasal passages are lined with a stringy fluid (mucus). Mucus normally drains out of your sinuses. When your nasal tissues become inflamed or swollen, the mucus can become trapped or blocked so air cannot flow through your sinuses. This allows bacteria, viruses, and funguses to grow, which leads to infection. Sinusitis can develop quickly and last for 7?10 days (acute) or for more than 12 weeks (chronic). Sinusitis often develops after a cold. What are the causes? This condition is caused by anything that creates swelling in the sinuses or stops mucus from draining, including:  Allergies.  Asthma.  Bacterial or viral infection.  Abnormally shaped bones between the nasal passages.  Nasal growths that contain mucus (nasal polyps).  Narrow sinus openings.  Pollutants, such as chemicals or irritants in the air.  A foreign object stuck in the nose.  A fungal  infection. This is rare.  What increases the risk? The following factors may make you more likely to develop this condition:  Having allergies or asthma.  Having had a recent cold or respiratory tract infection.  Having structural deformities or blockages in your nose or sinuses.  Having a weak immune system.  Doing a lot of swimming or diving.  Overusing nasal sprays.  Smoking.  What are the signs or symptoms? The main symptoms of this condition are pain and a feeling of pressure around the affected sinuses. Other symptoms include:  Upper toothache.  Earache.  Headache.  Bad breath.  Decreased sense of smell and taste.  A cough that may get worse at night.  Fatigue.  Fever.  Thick drainage from your nose. The drainage is often green and it may contain pus (purulent).  Stuffy nose or congestion.  Postnasal drip. This is when extra mucus collects in the throat or back of the nose.  Swelling and warmth over the affected sinuses.  Sore throat.  Sensitivity to light.  How is this diagnosed? This condition is diagnosed based on symptoms, a medical history, and a physical exam. To find out if your condition is acute or chronic, your health care provider may:  Look in your nose for signs of nasal polyps.  Tap over the affected sinus to check for signs of infection.  View the inside of your sinuses using an imaging device that has a light attached (endoscope).  If your health care provider suspects that you have chronic sinusitis, you may also:  Be tested for allergies.    Have a sample of mucus taken from your nose (nasal culture) and checked for bacteria.  Have a mucus sample examined to see if your sinusitis is related to an allergy.  If your sinusitis does not respond to treatment and it lasts longer than 8 weeks, you may have an MRI or CT scan to check your sinuses. These scans also help to determine how severe your infection is. In rare cases, a bone  biopsy may be done to rule out more serious types of fungal sinus disease. How is this treated? Treatment for sinusitis depends on the cause and whether your condition is chronic or acute. If a virus is causing your sinusitis, your symptoms will go away on their own within 10 days. You may be given medicines to relieve your symptoms, including:  Topical nasal decongestants. They shrink swollen nasal passages and let mucus drain from your sinuses.  Antihistamines. These drugs block inflammation that is triggered by allergies. This can help to ease swelling in your nose and sinuses.  Topical nasal corticosteroids. These are nasal sprays that ease inflammation and swelling in your nose and sinuses.  Nasal saline washes. These rinses can help to get rid of thick mucus in your nose.  If your condition is caused by bacteria, you will be given an antibiotic medicine. If your condition is caused by a fungus, you will be given an antifungal medicine. Surgery may be needed to correct underlying conditions, such as narrow nasal passages. Surgery may also be needed to remove polyps. Follow these instructions at home: Medicines  Take, use, or apply over-the-counter and prescription medicines only as told by your health care provider. These may include nasal sprays.  If you were prescribed an antibiotic medicine, take it as told by your health care provider. Do not stop taking the antibiotic even if you start to feel better. Hydrate and Humidify  Drink enough water to keep your urine clear or pale yellow. Staying hydrated will help to thin your mucus.  Use a cool mist humidifier to keep the humidity level in your home above 50%.  Inhale steam for 10-15 minutes, 3-4 times a day or as told by your health care provider. You can do this in the bathroom while a hot shower is running.  Limit your exposure to cool or dry air. Rest  Rest as much as possible.  Sleep with your head raised  (elevated).  Make sure to get enough sleep each night. General instructions  Apply a warm, moist washcloth to your face 3-4 times a day or as told by your health care provider. This will help with discomfort.  Wash your hands often with soap and water to reduce your exposure to viruses and other germs. If soap and water are not available, use hand sanitizer.  Do not smoke. Avoid being around people who are smoking (secondhand smoke).  Keep all follow-up visits as told by your health care provider. This is important. Contact a health care provider if:  You have a fever.  Your symptoms get worse.  Your symptoms do not improve within 10 days. Get help right away if:  You have a severe headache.  You have persistent vomiting.  You have pain or swelling around your face or eyes.  You have vision problems.  You develop confusion.  Your neck is stiff.  You have trouble breathing. This information is not intended to replace advice given to you by your health care provider. Make sure you discuss any questions you   have with your health care provider. Document Released: 10/15/2005 Document Revised: 06/10/2016 Document Reviewed: 08/10/2015 Elsevier Interactive Patient Education  2018 Elsevier Inc.  

## 2017-11-05 NOTE — Telephone Encounter (Signed)
Patient was seen by Dr Nolon Rod on 11/01/17 but I do not see anything mentioned about FMLA forms and I have not had anything come across my desk for this patient. I will forward this to Dr Nolon Rod and see if she knows anything about them. Also will send to Dr Tamala Julian to see if she has seen anything for her.

## 2017-11-08 IMAGING — DX DG LUMBAR SPINE 2-3V
4 series · 4 of 4 positions shown · non-contrast
Comparison: 11/11/2013

CLINICAL DATA: Fall 2 weeks ago, but no initial back pain. Now with
right-sided sciatica for 3 days.

EXAM:
LUMBAR SPINE - 2-3 VIEW

[l-spine ap (1 of 2)]
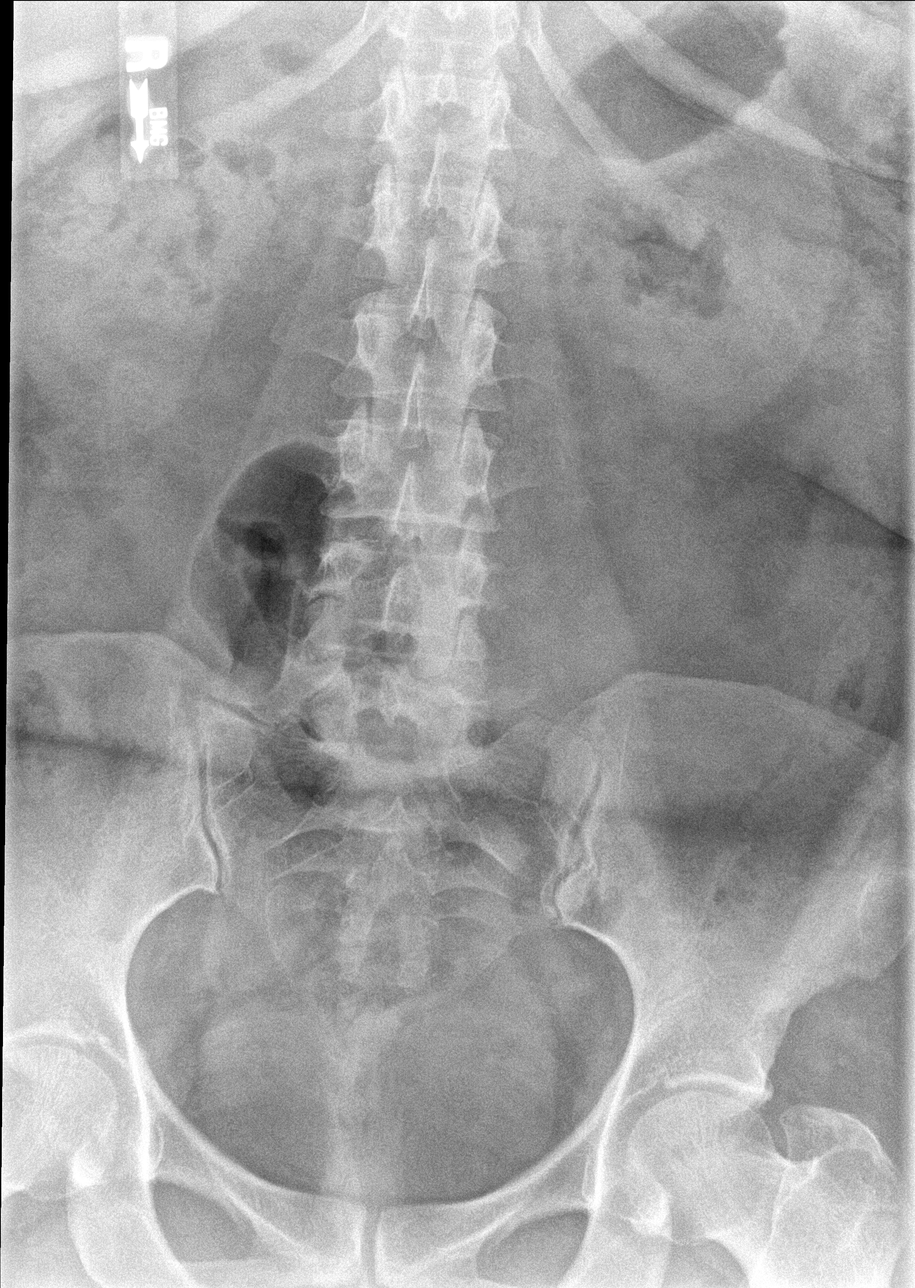

[l-spine lat]
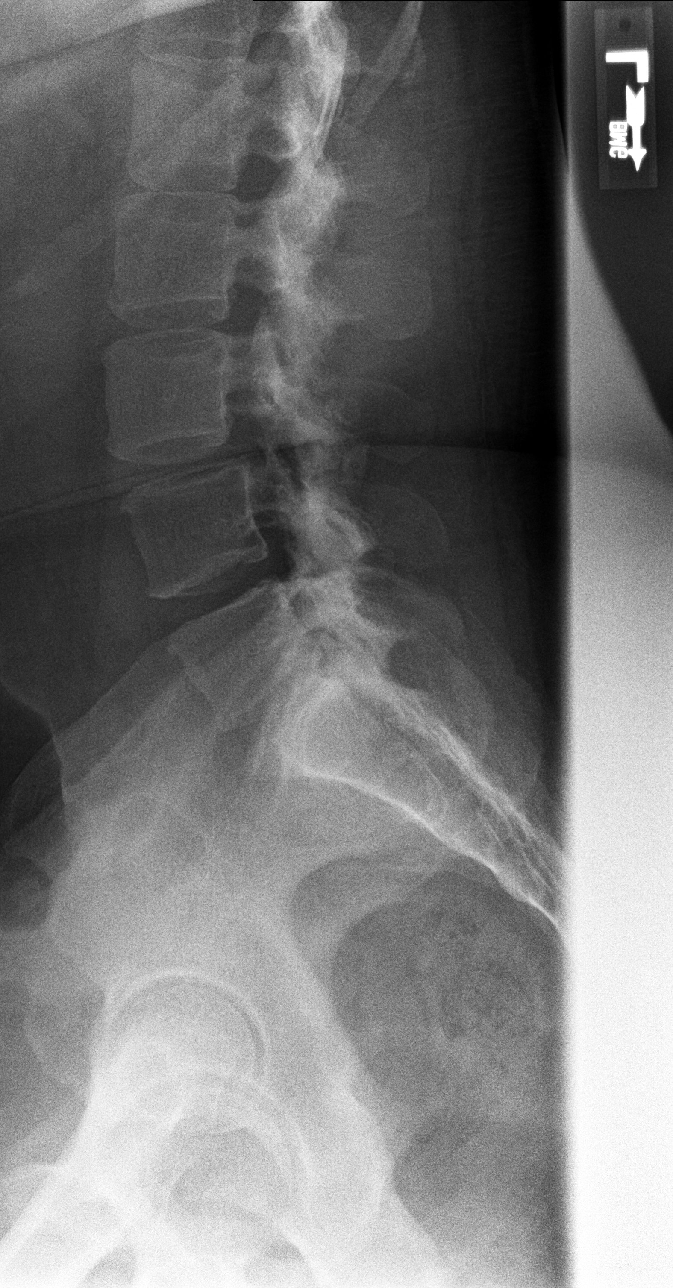

[l-spine l5-s1]
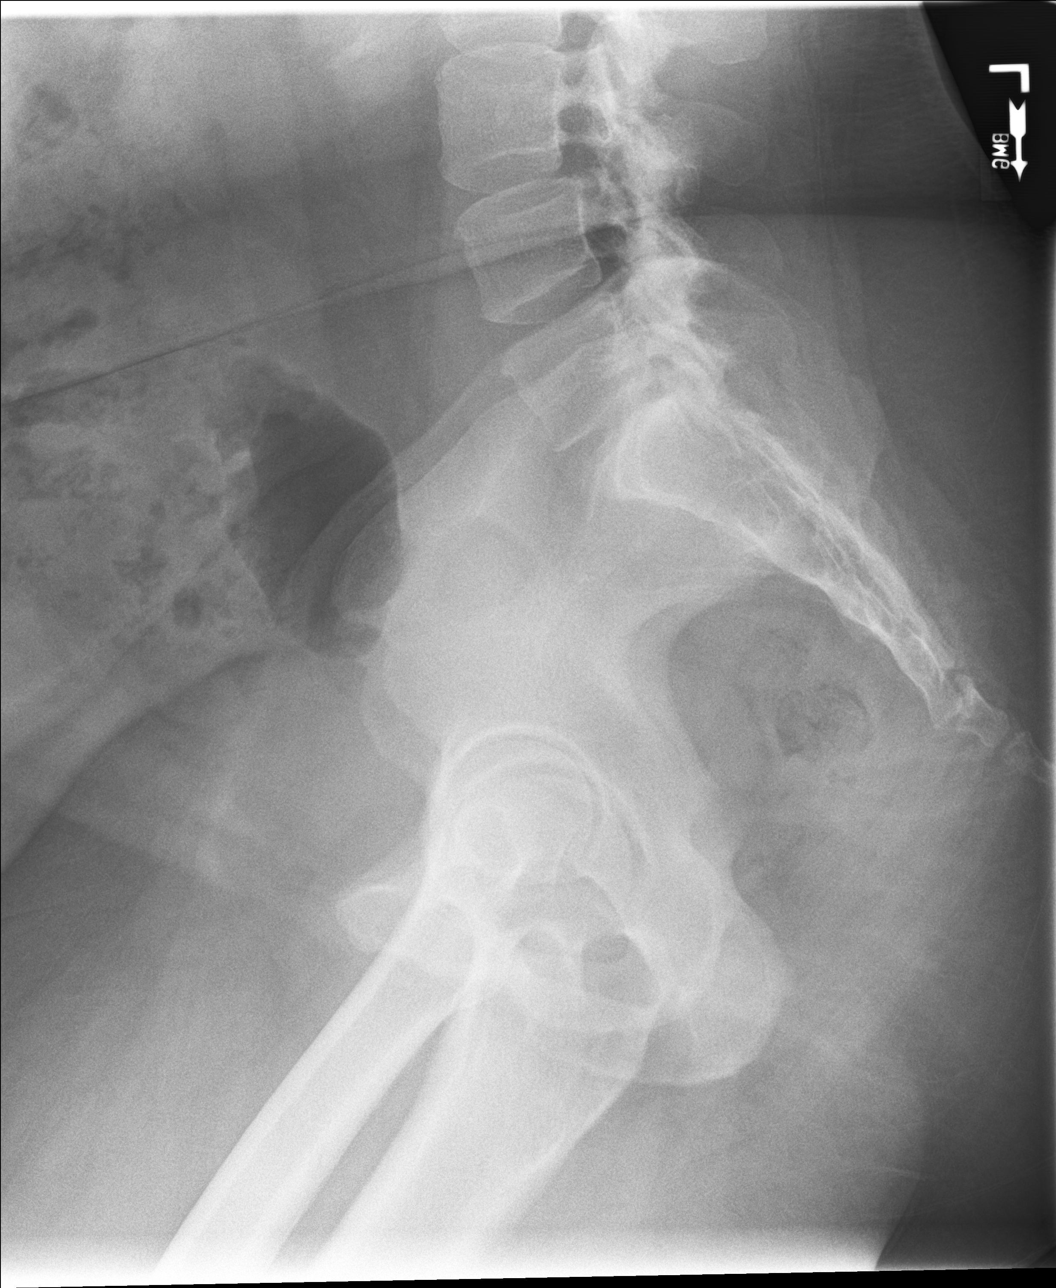

[l-spine ap (2 of 2)]
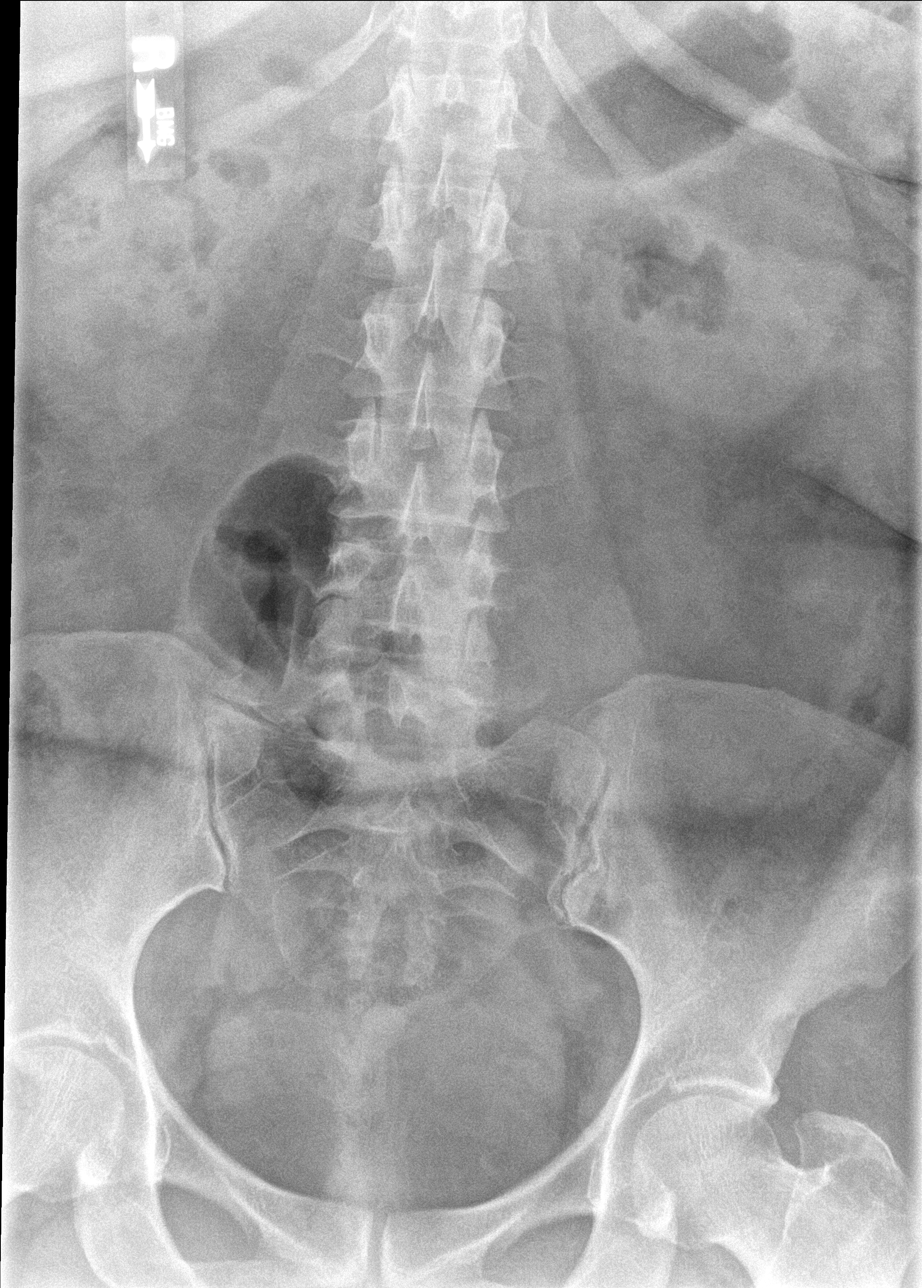

[4 of 4 positions shown; findings below may reference images not displayed]

FINDINGS: No fracture.  No spondylolisthesis.

S1 is a transitional lumbosacral vertebra.

Disc spaces are relatively well preserved. There are minor endplate
osteophytes from the mid to lower lumbar spine.

Soft tissues are unremarkable.
IMPRESSION: 1. No fracture or acute finding.
2. Minimal mid to lower lumbar spine disc degenerative change.
Transitional lumbosacral vertebra.

## 2017-11-08 NOTE — Telephone Encounter (Signed)
She did not discuss this FMLA with me. Will route to her pcp.

## 2017-11-15 NOTE — Telephone Encounter (Signed)
Paperwork scanned and faxed on 11/14/17

## 2017-11-18 ENCOUNTER — Encounter: Payer: Self-pay | Admitting: Family Medicine

## 2017-11-18 ENCOUNTER — Ambulatory Visit: Payer: Managed Care, Other (non HMO) | Admitting: Family Medicine

## 2017-11-18 ENCOUNTER — Other Ambulatory Visit: Payer: Self-pay

## 2017-11-18 VITALS — BP 122/68 | HR 87 | Temp 98.0°F | Resp 16 | Ht 62.21 in | Wt 202.0 lb

## 2017-11-18 DIAGNOSIS — E7439 Other disorders of intestinal carbohydrate absorption: Secondary | ICD-10-CM | POA: Diagnosis not present

## 2017-11-18 DIAGNOSIS — L304 Erythema intertrigo: Secondary | ICD-10-CM | POA: Diagnosis not present

## 2017-11-18 DIAGNOSIS — K219 Gastro-esophageal reflux disease without esophagitis: Secondary | ICD-10-CM

## 2017-11-18 DIAGNOSIS — Z9884 Bariatric surgery status: Secondary | ICD-10-CM | POA: Diagnosis not present

## 2017-11-18 DIAGNOSIS — F419 Anxiety disorder, unspecified: Secondary | ICD-10-CM

## 2017-11-18 DIAGNOSIS — R1013 Epigastric pain: Secondary | ICD-10-CM

## 2017-11-18 DIAGNOSIS — J301 Allergic rhinitis due to pollen: Secondary | ICD-10-CM | POA: Diagnosis not present

## 2017-11-18 DIAGNOSIS — E890 Postprocedural hypothyroidism: Secondary | ICD-10-CM

## 2017-11-18 DIAGNOSIS — F329 Major depressive disorder, single episode, unspecified: Secondary | ICD-10-CM | POA: Diagnosis not present

## 2017-11-18 DIAGNOSIS — F32A Depression, unspecified: Secondary | ICD-10-CM

## 2017-11-18 NOTE — Progress Notes (Signed)
Subjective:    Patient ID: [name], female    DOB: May 10, 1976, 42 y.o.   MRN: [id number]  11/18/2017  Chronic Conditions (follow-up ); Hypothyroidism; Abdominal Pain; and Rash    HPI This 42 y.o. female presents for one month follow-up:   Epigastric pain: still having pain with eating and water.  S/p UGI: moderate esophageal motility.  No GERD.  Hurts three seconds after drinking water or eating for one minute; goes away after eating.  Taking Pantoprazole and Loratadine at 8:00am.  Taking lunch at 1000.    Hypothyroidism: started taking Levothyroxine at 3:45; then eats one hour later.  Menorrhagia: having horrible menses with clotting this month.    Chronic issue for patient.  Typical menses per patient.  Sinusitis: improved with Claritin, Flonase, Amoxicillin.  Intertrigo:  No cream for two weeks.  Has cooled down at work.  No heat at work.  Now that not sweating, no rash.   Anxiety and depression: Prozac helps with irritability.  Less short tempered.   BP Readings from Last 3 Encounters:  11/18/17 122/68  11/04/17 (!) 142/84  10/14/17 122/70   Wt Readings from Last 3 Encounters:  11/18/17 202 lb (91.6 kg)  11/04/17 210 lb 3.2 oz (95.3 kg)  10/14/17 212 lb (96.2 kg)   Immunization History  Administered Date(s) Administered  . Influenza Split 06/26/2017  . Influenza,inj,Quad PF,6+ Mos 06/29/2015, 07/24/2016, 10/14/2017  . Pneumococcal Polysaccharide-23 06/29/2015  . Tdap 06/29/2015    Review of Systems  Constitutional: Negative for chills, diaphoresis, fatigue and fever.  HENT: Negative for congestion, ear pain, postnasal drip, rhinorrhea, sinus pressure, sinus pain and sore throat.   Eyes: Negative for visual disturbance.  Respiratory: Negative for cough and shortness of breath.   Cardiovascular: Negative for chest pain, palpitations and leg swelling.  Gastrointestinal: Positive for abdominal pain. Negative for abdominal distention, anal bleeding,  blood in stool, constipation, diarrhea, nausea and vomiting.  Endocrine: Negative for cold intolerance, heat intolerance, polydipsia, polyphagia and polyuria.  Genitourinary: Positive for menstrual problem.  Skin: Negative for color change and rash.  Neurological: Negative for dizziness, tremors, seizures, syncope, facial asymmetry, speech difficulty, weakness, light-headedness, numbness and headaches.  Psychiatric/Behavioral: Negative for dysphoric mood. The patient is not nervous/anxious.     Past Medical History:  Diagnosis Date  . Carpal tunnel syndrome, bilateral   . Depression   . GERD (gastroesophageal reflux disease)   . H. pylori infection    dx  09-02-2014-- treated w/ antibiotic  . Hypertension    recently low pressures   . Irregular menstrual cycle   . Menorrhagia   . PCOS (polycystic ovarian syndrome)   . Type 2 diabetes mellitus ([place])    Past Surgical History:  Procedure Laterality Date  . CARPAL TUNNEL RELEASE Bilateral 10/07/2014   Procedure: CARPAL TUNNEL RELEASE BILATERAL;  Surgeon: [name], MD;  Location[facility];  Service: Orthopedics;  Laterality: Bilateral;  . CESAREAN SECTION  1995  . GASTRIC ROUX-EN-Y N/A 03/12/2017   Procedure: LAPAROSCOPIC ROUX-EN-Y GASTRIC BYPASS WITH UPPER ENDOSCOPY;  Surgeon: Kieth Brightly, Arta Bruce, MD;  Location: [facility];  Service: General;  Laterality: N/A;  . UPPER GI ENDOSCOPY  03/12/2017   Procedure: UPPER GI ENDOSCOPY;  Surgeon: Kieth Brightly Arta Bruce, MD;  Location: [facility];  Service: General;;   Allergies  Allergen Reactions  . Food     RAW/FRESH FRUIT--PEACHES, PEARS, APPLES=THROAT CLOSES/SWELLING & COUGHING SHORTNESS OF BREATH  . 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 Shrimp [  Shellfish Allergy] Anaphylaxis   Current Outpatient Medications on File Prior to Visit  Medication Sig Dispense Refill  . clindamycin (CLINDAGEL) 1 % gel Apply topically 2 (two) times daily. To face. 30 g 1  . desonide (DESOWEN)  0.05 % cream APPLY  CREAM TOPICALLY TWICE DAILY 15 g 0  . FLUoxetine (PROZAC) 20 MG tablet Take 1 tablet (20 mg total) by mouth daily. 90 tablet 1  . fluticasone (FLONASE) 50 MCG/ACT nasal spray Place 2 sprays into both nostrils daily. 16 g 6  . glucose blood (FREESTYLE TEST STRIPS) test strip Please fill with preferred strip and check sugar once daily 100 each 4  . hydrocortisone (ANUSOL-HC) 25 MG suppository Place 1 suppository (25 mg total) rectally 2 (two) times daily. 12 suppository 0  . levothyroxine (SYNTHROID, LEVOTHROID) 100 MCG tablet Take 1 tablet (100 mcg total) by mouth daily. 90 tablet 3  . loratadine (CLARITIN) 10 MG tablet Take 1 tablet (10 mg total) by mouth daily. 90 tablet 3  . nystatin (NYSTATIN) powder Apply 1 g topically 2 (two) times daily. 56 g 2  . nystatin cream (MYCOSTATIN) Apply 1 application topically 2 (two) times daily. 30 g 5  . NYSTATIN powder APPLY 1 GRAM TOPICALLY TWO TIMES DAILY 1 Bottle 1  . Olopatadine HCl 0.2 % SOLN Apply 1 drop to eye daily. (Patient taking differently: Apply 1 drop to eye daily as needed (for conjuctivitis). ) 2.5 mL 2  . pantoprazole (PROTONIX) 40 MG tablet Take 1 tablet (40 mg total) by mouth daily. 30 tablet 1   No current facility-administered medications on file prior to visit.    Social History   Socioeconomic History  . Marital status: Married    Spouse name: Not on file  . Number of children: 2  . Years of education: Not on file  . Highest education level: Not on file  Social Needs  . Financial resource strain: Not on file  . Food insecurity - worry: Not on file  . Food insecurity - inability: Not on file  . Transportation needs - medical: Not on file  . Transportation needs - non-medical: Not on file  Occupational History  . Not on file  Tobacco Use  . Smoking status: Never Smoker  . Smokeless tobacco: Never Used  Substance and Sexual Activity  . Alcohol use: Yes    Alcohol/week: 0.0 oz    Comment: socially  .  Drug use: No  . Sexual activity: Yes    Partners: Male    Birth control/protection: None  Other Topics Concern  . Not on file  Social History Narrative   Marital status: married x 1 year; from [place].  Moved to [place] since 2000 from [place]; born in [place].      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        Employment:  [employer] full time;       Tobacco: none      Alcohol: social      Drugs: none      Exercise: none   No reported caffeine use    Family History  Problem Relation Age of Onset  . Heart disease Mother 47       CAD  . Diabetes Mother   . Hypertension Mother   . Cancer Father 56       prostate cancer  .  Diabetes Father   . Thyroid disease Neg Hx        Objective:    BP 122/68   Pulse 87   Temp 98 F (36.7 C)   Resp 16   Ht 5' 2.21" (1.58 m)   Wt 202 lb (91.6 kg)   LMP 10/19/2017   SpO2 100%   BMI 36.70 kg/m  Physical Exam  Constitutional: She is oriented to person, place, and time. She appears well-developed and well-nourished. No distress.  HENT:  Head: Normocephalic and atraumatic.  Right Ear: External ear normal.  Left Ear: External ear normal.  Nose: Nose normal.  Mouth/Throat: Oropharynx is clear and moist.  Eyes: Conjunctivae and EOM are normal. Pupils are equal, round, and reactive to light.  Neck: Normal range of motion. Neck supple. Carotid bruit is not present. No thyromegaly present.  Cardiovascular: Normal rate, regular rhythm, normal heart sounds and intact distal pulses. Exam reveals no gallop and no friction rub.  No murmur heard. Pulmonary/Chest: Effort normal and breath sounds normal. She has no wheezes. She has no rales.  Abdominal: Soft. Bowel sounds are normal. She exhibits no distension and no mass. There is tenderness in the epigastric area. There is no rebound and no guarding. No hernia.  Lymphadenopathy:    She has no cervical  adenopathy.  Neurological: She is alert and oriented to person, place, and time. No cranial nerve deficit.  Skin: Skin is warm and dry. No rash noted. She is not diaphoretic. No erythema. No pallor.  Well-healed rash in bilateral inguinal regions.  Psychiatric: She has a normal mood and affect. Her behavior is normal.   No results found. Depression screen [facility] 2/9 11/18/2017 10/14/2017 09/11/2017 06/26/2017 04/29/2017  Decreased Interest 0 0 0 0 0  Down, Depressed, Hopeless 0 0 0 0 0  PHQ - 2 Score 0 0 0 0 0  Altered sleeping - - - - -  Tired, decreased energy - - - - -  Change in appetite - - - - -  Feeling bad or failure about yourself  - - - - -  Trouble concentrating - - - - -  Moving slowly or fidgety/restless - - - - -  Suicidal thoughts - - - - -  PHQ-9 Score - - - - -  Some recent data might be hidden   Fall Risk  11/18/2017 11/04/2017 10/14/2017 09/11/2017 06/26/2017  Falls in the past year? No No No No No  Number falls in past yr: - - - - -  Injury with [place]:   1. Postablative hypothyroidism   2. Glucose intolerance   3. Abdominal pain, epigastric   4. Seasonal allergic rhinitis due to pollen   5. Gastroesophageal reflux disease without esophagitis   6. Anxiety and depression   7. S/P gastric bypass   8. Intertrigo     Uncontrolled post ablative hypothyroidism; improved compliance with levothyroxine replacement.  Repeat labs today.  Continue current medication as prescribed.  Diabetes mellitus has resolved with weight loss status post gastric bypass.  Repeat hemoglobin A1c and glucose today.  Congratulations on weight loss success.  Ongoing postprandial epigastric abdominal pain.  Status post blood work and upper GI per general surgery.  No improvement with daily Protonix therapy.  Pain is transient and resolves within 1 minute after eating yet is chronic and ongoing.  Consider trial of  Carafate if general surgery  is agreeable.  Intertrigo has finally resolved with colder temperatures at work.  Recommend ongoing antiperspirant application to the area starting in March.  Anxiety and depression for marital strain is improved with Prozac therapy.  Status post gastric bypass: Weight down an additional 10 pounds since reinitiating levothyroxine therapy at visit 1 month ago.  Congratulations on weight loss.  Has been able to discontinue antihypertensives and diabetic medications.  Congratulations!  Orders Placed This Encounter  Procedures  . CBC with Differential/Platelet  . Comprehensive metabolic panel  . Hemoglobin A1c  . TSH  . T4, free   No orders of the defined types were placed in this encounter.   Return in about 4 weeks (around 12/16/2017) for recheck.   Manveer Gomes Elayne Guerin, M.D. [facility] [address] [place], [place]  [address] [phone] phone [phone] fax

## 2017-11-18 NOTE — Patient Instructions (Signed)
     IF you received an x-ray today, you will receive an invoice from [facility]. Please contact [facility] at [phone] with questions or concerns regarding your invoice.   IF you received labwork today, you will receive an invoice from [facility]. Please contact [facility] at [phone] with questions or concerns regarding your invoice.   Our billing staff will not be able to assist you with questions regarding bills from these companies.  You will be contacted with the lab results as soon as they are available. The fastest way to get your results is to activate your My Chart account. Instructions are located on the last page of this paperwork. If you have not heard from us regarding the results in 2 weeks, please contact this office.     

## 2017-11-19 ENCOUNTER — Encounter: Payer: Self-pay | Admitting: Family Medicine

## 2017-11-19 LAB — COMPREHENSIVE METABOLIC PANEL
A/G RATIO: 1.5 (ref 1.2–2.2)
ALK PHOS: 79 IU/L (ref 39–117)
ALT: 26 IU/L (ref 0–32)
AST: 20 IU/L (ref 0–40)
Albumin: 4.1 g/dL (ref 3.5–5.5)
BILIRUBIN TOTAL: 0.2 mg/dL (ref 0.0–1.2)
BUN/Creatinine Ratio: 22 (ref 9–23)
BUN: 16 mg/dL (ref 6–24)
CO2: 24 mmol/L (ref 20–29)
Calcium: 8.7 mg/dL (ref 8.7–10.2)
Chloride: 106 mmol/L (ref 96–106)
Creatinine, Ser: 0.72 mg/dL (ref 0.57–1.00)
GFR calc Af Amer: 120 mL/min/{1.73_m2} (ref >59)
GFR calc non Af Amer: 104 mL/min/{1.73_m2} (ref >59)
GLOBULIN, TOTAL: 2.7 g/dL (ref 1.5–4.5)
Glucose: 88 mg/dL (ref 65–99)
POTASSIUM: 4 mmol/L (ref 3.5–5.2)
SODIUM: 142 mmol/L (ref 134–144)
Total Protein: 6.8 g/dL (ref 6.0–8.5)

## 2017-11-19 LAB — TSH: TSH: 26.86 u[IU]/mL — ABNORMAL HIGH (ref 0.450–4.500)

## 2017-11-19 LAB — T4, FREE: FREE T4: 0.66 ng/dL — AB (ref 0.82–1.77)

## 2017-11-19 LAB — HEMOGLOBIN A1C
Est. average glucose Bld gHb Est-mCnc: 111 mg/dL
Hgb A1c MFr Bld: 5.5 % (ref 4.8–5.6)

## 2017-11-19 LAB — CBC WITH DIFFERENTIAL/PLATELET
Basophils Absolute: 0.1 10*3/uL (ref 0.0–0.2)
Basos: 1 %
EOS (ABSOLUTE): 0.4 10*3/uL (ref 0.0–0.4)
Eos: 5 %
Hematocrit: 38.9 % (ref 34.0–46.6)
Hemoglobin: 12.5 g/dL (ref 11.1–15.9)
Immature Grans (Abs): 0 10*3/uL (ref 0.0–0.1)
Immature Granulocytes: 0 %
LYMPHS ABS: 2.2 10*3/uL (ref 0.7–3.1)
Lymphs: 31 %
MCH: 29.6 pg (ref 26.6–33.0)
MCHC: 32.1 g/dL (ref 31.5–35.7)
MCV: 92 fL (ref 79–97)
MONOCYTES: 9 %
MONOS ABS: 0.6 10*3/uL (ref 0.1–0.9)
NEUTROS PCT: 54 %
Neutrophils Absolute: 3.7 10*3/uL (ref 1.4–7.0)
Platelets: 441 10*3/uL — ABNORMAL HIGH (ref 150–379)
RBC: 4.22 x10E6/uL (ref 3.77–5.28)
RDW: 13.4 % (ref 12.3–15.4)
WBC: 6.9 10*3/uL (ref 3.4–10.8)

## 2017-11-19 NOTE — Telephone Encounter (Signed)
Need to know  end date of reduced work hours, see prior message

## 2017-11-19 NOTE — Telephone Encounter (Signed)
Forwarded message to [place] for [employer] - information employer needs.

## 2017-11-19 NOTE — Telephone Encounter (Signed)
Please call and advise patient of date of paperwork processing.

## 2017-11-19 NOTE — Telephone Encounter (Signed)
Employer calling and would like to speak to someone regarding [employer] w/ [employer] [phone] ext [phone]

## 2017-11-21 NOTE — Telephone Encounter (Signed)
Paperwork updated to include end date. Re faxed to employer on 11/21/17

## 2017-11-22 DIAGNOSIS — L304 Erythema intertrigo: Secondary | ICD-10-CM | POA: Insufficient documentation

## 2017-11-25 DIAGNOSIS — Z0271 Encounter for disability determination: Secondary | ICD-10-CM

## 2017-12-02 IMAGING — RF DG HYSTEROGRAM
4 series · 4 of 4 positions shown · IV contrast (omnipaque)
Comparison: None.

CLINICAL DATA: Infertility

EXAM:
HYSTEROSALPINGOGRAM
TECHNIQUE: Following cleansing of the cervix and vagina with Betadine solution,
a hysterosalpingogram was performed using a 5-French
hysterosalpingogram catheter and Omnipaque 300 contrast. The patient
tolerated the examination without difficulty.

[Series 1: run · 1 of 1 slices shown (1 of 4)]
[im 1/1]
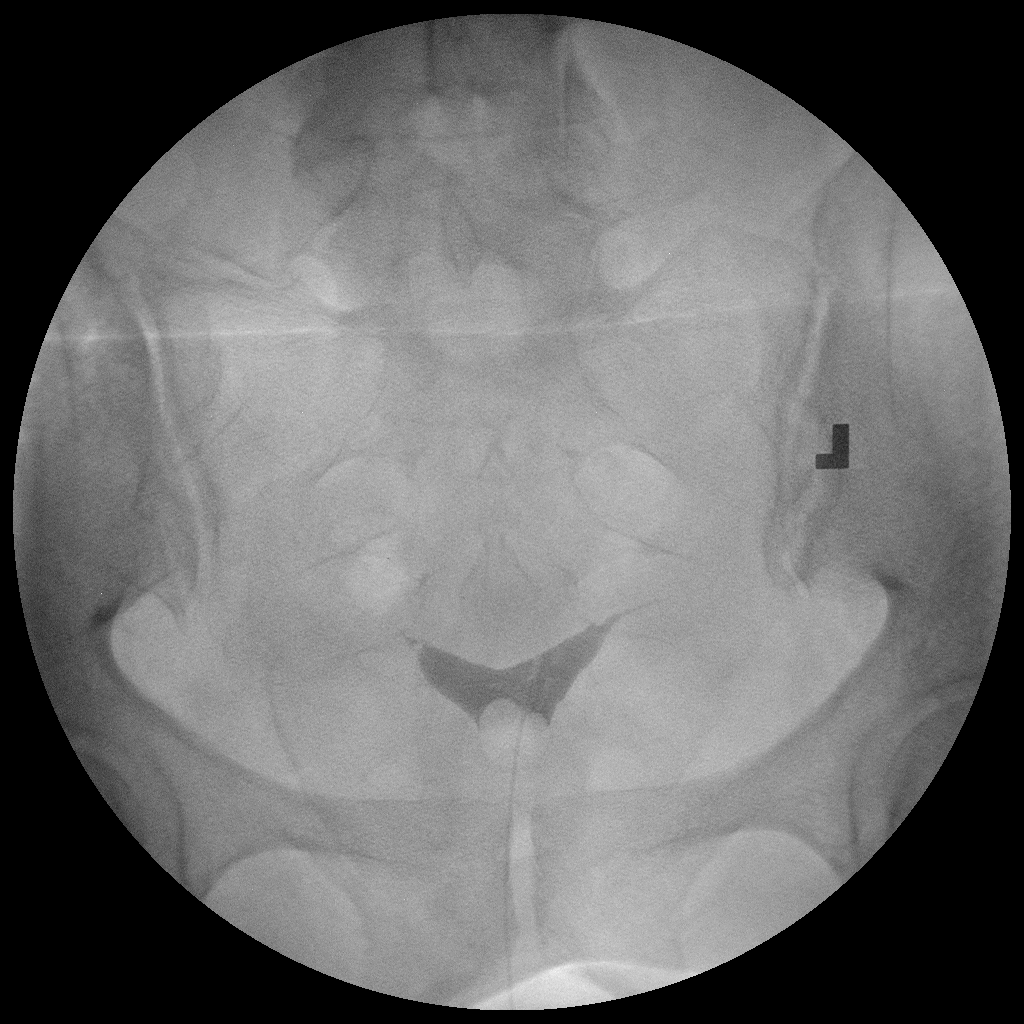

[Series 2: run · 1 of 1 slices shown (2 of 4)]
[im 1/1]
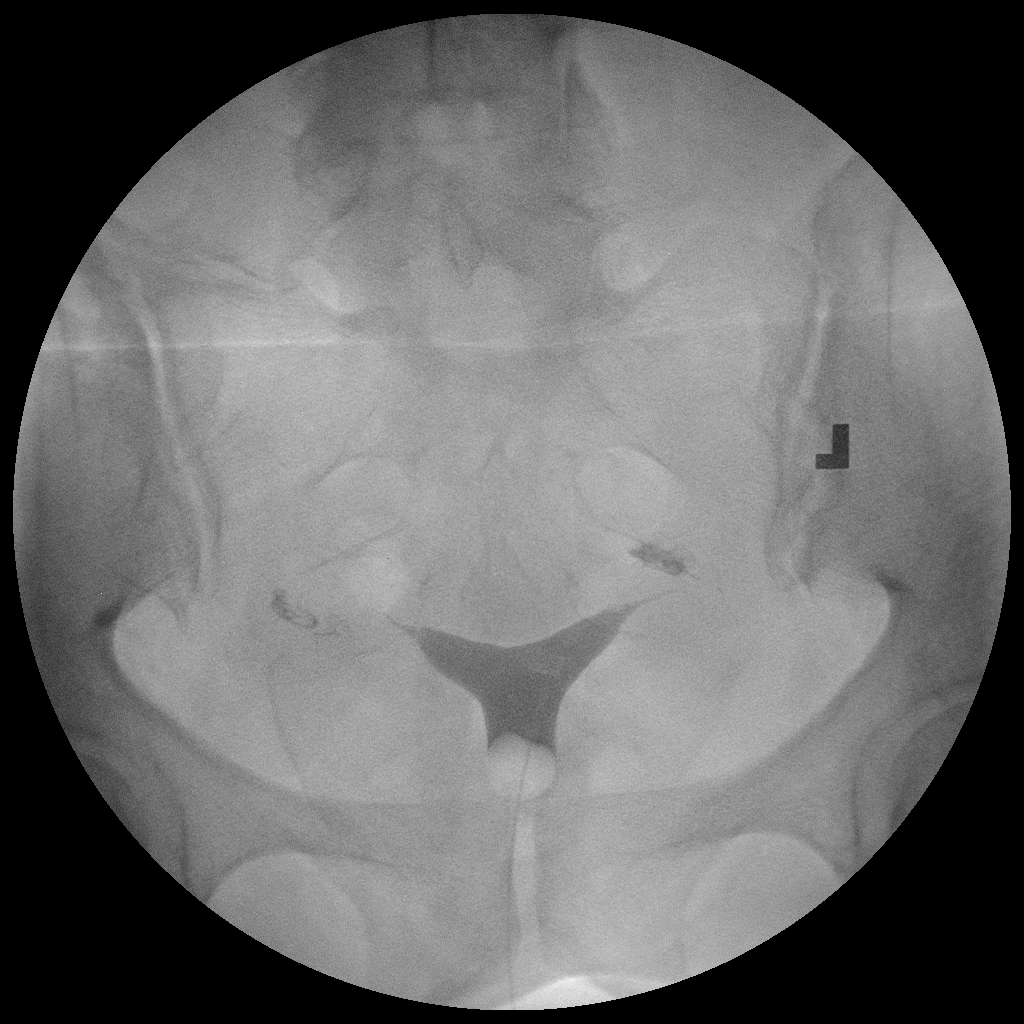

[Series 3: run · 1 of 1 slices shown (3 of 4)]
[im 1/1]
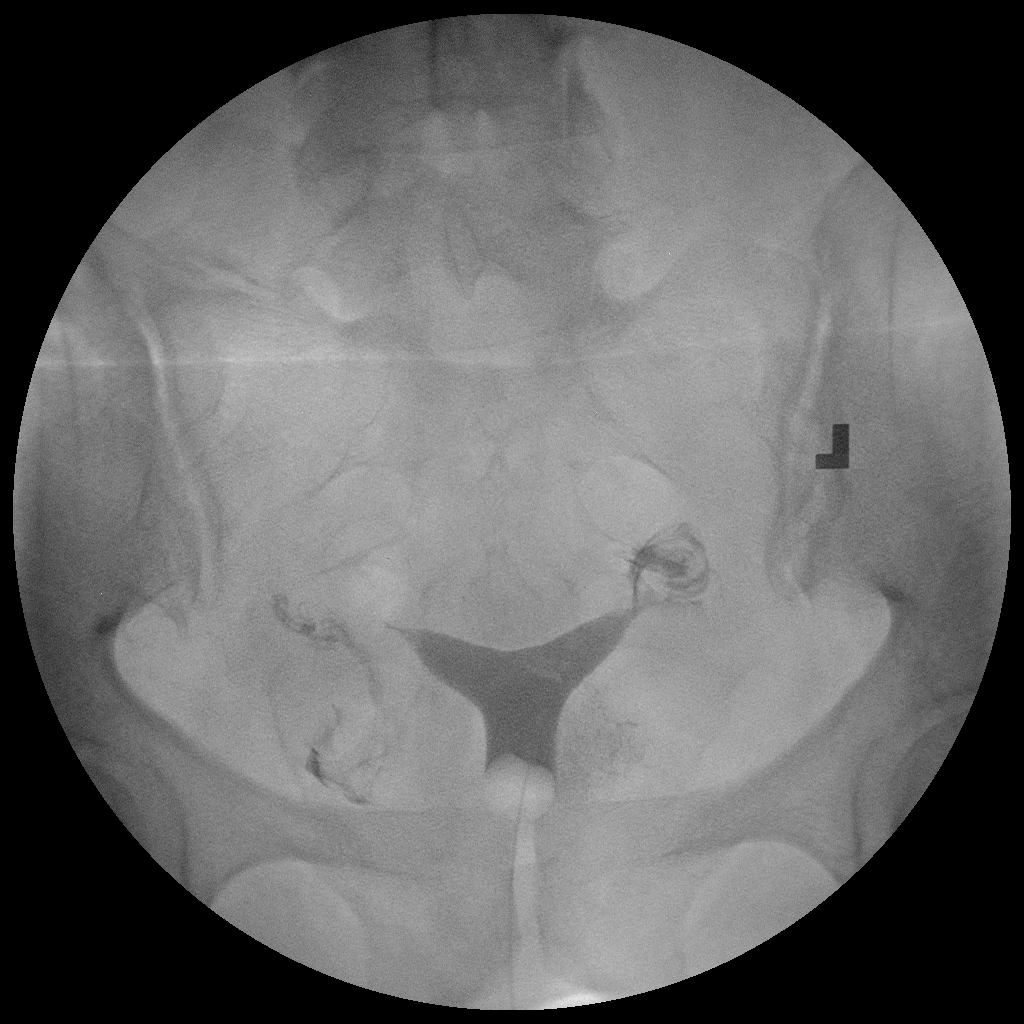

[Series 4: run · 1 of 1 slices shown (4 of 4)]
[im 1/1]
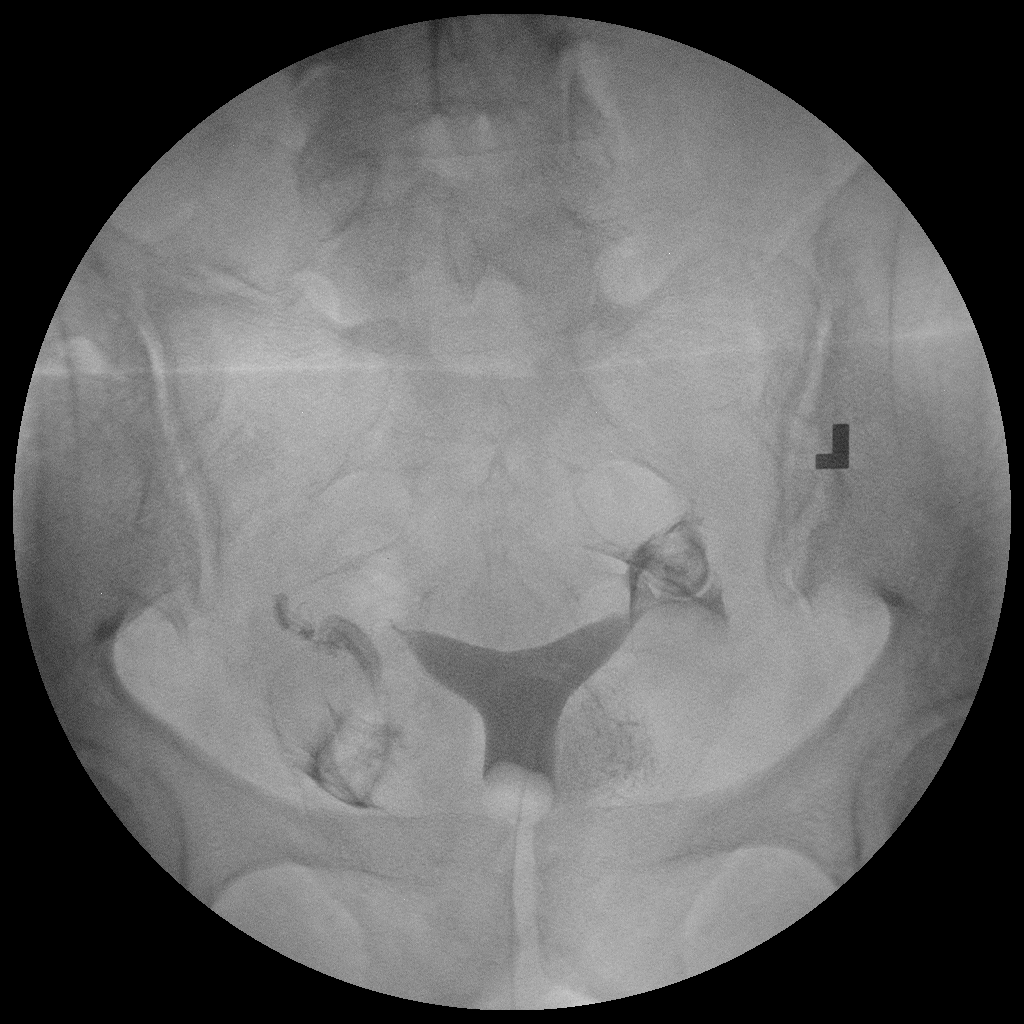

[4 of 4 positions shown; findings below may reference images not displayed]

FLUOROSCOPY TIME:  Radiation Exposure Index (as provided by the
fluoroscopic device):

If the device does not provide the exposure index:

Fluoroscopy Time:  36 seconds

Number of Acquired Images:  4
FINDINGS: Endometrial cavity is normal. Both fallopian tubes fill and have a
normal appearance. Normal spillage bilaterally.
IMPRESSION: Normal study.

## 2017-12-09 ENCOUNTER — Telehealth: Payer: Self-pay | Admitting: Family Medicine

## 2017-12-10 NOTE — Progress Notes (Signed)
error 

## 2017-12-11 ENCOUNTER — Encounter: Payer: BLUE CROSS/BLUE SHIELD | Admitting: Family Medicine

## 2017-12-16 ENCOUNTER — Encounter: Payer: Self-pay | Admitting: Family Medicine

## 2017-12-16 ENCOUNTER — Ambulatory Visit (INDEPENDENT_AMBULATORY_CARE_PROVIDER_SITE_OTHER): Payer: BLUE CROSS/BLUE SHIELD

## 2017-12-16 ENCOUNTER — Ambulatory Visit (INDEPENDENT_AMBULATORY_CARE_PROVIDER_SITE_OTHER): Payer: BLUE CROSS/BLUE SHIELD | Admitting: Family Medicine

## 2017-12-16 ENCOUNTER — Other Ambulatory Visit: Payer: Self-pay

## 2017-12-16 VITALS — BP 122/80 | HR 83 | Temp 98.0°F | Resp 16 | Ht 62.99 in | Wt 200.0 lb

## 2017-12-16 DIAGNOSIS — E890 Postprocedural hypothyroidism: Secondary | ICD-10-CM

## 2017-12-16 DIAGNOSIS — E282 Polycystic ovarian syndrome: Secondary | ICD-10-CM | POA: Diagnosis not present

## 2017-12-16 DIAGNOSIS — M25511 Pain in right shoulder: Secondary | ICD-10-CM | POA: Diagnosis not present

## 2017-12-16 DIAGNOSIS — Z9884 Bariatric surgery status: Secondary | ICD-10-CM

## 2017-12-16 DIAGNOSIS — L304 Erythema intertrigo: Secondary | ICD-10-CM

## 2017-12-16 DIAGNOSIS — N946 Dysmenorrhea, unspecified: Secondary | ICD-10-CM

## 2017-12-16 DIAGNOSIS — R1013 Epigastric pain: Secondary | ICD-10-CM

## 2017-12-16 DIAGNOSIS — R209 Unspecified disturbances of skin sensation: Secondary | ICD-10-CM | POA: Diagnosis not present

## 2017-12-16 DIAGNOSIS — M533 Sacrococcygeal disorders, not elsewhere classified: Secondary | ICD-10-CM

## 2017-12-16 DIAGNOSIS — IMO0001 Reserved for inherently not codable concepts without codable children: Secondary | ICD-10-CM
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
## 2017-12-16 NOTE — Patient Instructions (Addendum)
IF you received an x-ray today, you will receive an invoice from [facility]. Please contact [facility] at [phone] with questions or concerns regarding your invoice.   IF you received labwork today, you will receive an invoice from [place]. Please contact [facility] at [phone] with questions or concerns regarding your invoice.   Our billing staff will not be able to assist you with questions regarding bills from these companies.  You will be contacted with the lab results as soon as they are available. The fastest way to get your results is to activate your My Chart account. Instructions are located on the last page of this paperwork. If you have not heard from [place] regarding the results in 2 weeks, please contact this office.      Sndrome de pinzamiento del hombro con rehabilitacin Shoulder Impingement Syndrome Rehab Consulte al mdico qu ejercicios son seguros para usted. Haga los ejercicios exactamente como se lo haya indicado el mdico y gradelos como se lo hayan indicado. Es normal sentir tirantez, tensin, presin o molestias leves mientras hace estos ejercicios, pero debe detenerse de inmediato si siente dolor repentino o si el dolor empeora.No comience a hacer estos ejercicios hasta que se lo indique el mdico. EJERCICIO DE [employer] Y AMPLITUD DE MOVIMIENTOS Este ejercicio calienta los msculos y las articulaciones, y mejora la movilidad y la flexibilidad del hombro. Adems, ayuda a Best boy y la rigidez. EjercicioA: Aduccin horizontal, pasivo 1. Sintese o prese, y lleve el brazo izquierdo/derecho con el codo extendido [employer] hombro opuesto, cruzando el pecho. Detngase cuando sienta una elongacin suave atrs del hombro y la parte superior del brazo.  Mantenga el brazo a la altura del hombro.  Mantenga el brazo lo ms cerca que pueda de su cuerpo, sin Psychiatrist. 1. Mantenga esta posicin durante __________  segundos. 2. Vuelva lentamente a la posicin inicial. Repita __________ veces. Realice este ejercicio __________ veces al da. EJERCICIOS DE FORTALECIMIENTO Estos ejercicios fortalecen el hombro y le otorgan resistencia. La resistencia es la capacidad de usar los msculos durante un tiempo prolongado, incluso despus de que se cansen. EjercicioB: Rotacin externa, isometra 1. Prese o sintese en la entrada de [place], de frente al marco de la puerta. 2. Flexione el codo izquierdo/derecho y Geneticist, molecular de atrs de la mueca contra el marco de la [place]. Solo la [employer] debe tocar el [place] parte superior del brazo al costado del cuerpo. 3. Presione suavemente la [employer] contra el marco de la [place], como si tratara de empujar el brazo en direccin contraria al abdomen.  Evite encoger el hombro mientras presiona con la mano sobre el marco de la [place]. Mantenga los omplatos juntos, llvelos hacia el centro de la espalda. 1. Mantenga esta posicin durante __________ segundos. 2. Afloje lentamente la tensin y relaje los msculos por completo antes de repetir el ejercicio. Repita __________ veces. Realice este ejercicio __________ veces al da. EjercicioC: Rotacin interna, isometra 1. Prese o sintese en la entrada de [place], de frente al marco de la puerta. 2. Flexione el codo izquierdo/derecho y Geneticist, molecular interna de la mueca contra el marco de la [place]. Solo la [employer] debe tocar el [place] parte superior del brazo al costado del cuerpo. 3. Presione suavemente la [employer] contra el marco de la [place], como si tratara de empujar el brazo hacia el abdomen.  Evite encoger el hombro mientras presiona con la mano sobre el [place] de  la puerta. Mantenga los omplatos juntos, llvelos hacia el centro de la espalda. 1. Mantenga esta posicin durante __________ segundos. 2. Afloje lentamente la tensin y relaje los msculos por completo antes de repetir el  ejercicio. Repita __________ veces. Realice este ejercicio __________ veces al da. EjercicioD: Protraccin escapular, decbito supino 1. Acustese boca arriba sobre una superficie firme. Sostenga una pesa de __________ con la mano izquierda/derecha. 2. Eleve el brazo izquierdo/derecho en el aire, de modo que la mano est directamente por encima de la articulacin del hombro. 3. Empuje con la pesa en el aire de forma que el hombro se levante de la superficie sobre la que est recostado. No mueva la cabeza, el cuello ni la espalda. 4. Mantenga esta posicin durante __________ segundos. 5. Vuelva lentamente a la posicin inicial. Relaje totalmente los msculos antes de repetir el ejercicio. Repita __________ veces. Realice este ejercicio __________ veces al da. EjercicioE: Retraccin escapular 1. Sintese en una silla estable que no tenga apoyabrazos o pngase de pie. 2. Ate una banda para ejercicios a un objeto estable que est frente a usted, de modo que la banda est a la altura del hombro. 3. Sostenga un extremo de la banda para ejercicios en cada mano. Las palmas deben estar Wendi Maya. 4. Junte los omplatos y [place] los codos ligeramente hacia atrs. No encoja los hombros al hacerlo. 5. Mantenga esta posicin durante __________ segundos. 6. Vuelva lentamente a la posicin inicial. Repita __________ veces. Realice este ejercicio __________ veces al da. EjercicioF: Extensin del hombro 1. Sintese en una silla estable que no tenga apoyabrazos o pngase de pie. 2. Ate una banda para ejercicios a un objeto estable que est frente a usted, de modo que la banda est por encima de la altura del hombro. 3. Sostenga un extremo de la banda para ejercicios en cada mano. 4. Extienda los codos y [employer] manos a la altura de los hombros. 5. Junte los omplatos y baje las manos hacia los costados de los muslos. Detngase cuando las manos estn en la misma posicin en ambos costados. No deje que  las manos vayan hacia atrs del cuerpo. 6. Mantenga esta posicin durante __________ segundos. 7. Vuelva lentamente a la posicin inicial. Repita __________ veces. Realice este ejercicio __________ veces al da. Esta informacin no tiene Marine scientist el consejo del mdico. Asegrese de hacerle al mdico cualquier pregunta que tenga. Document Released: 08/01/2006 Document Revised: 07/06/2015 Document Reviewed: 09/17/2015 Elsevier Interactive Patient Education  [employer].

## 2017-12-17 ENCOUNTER — Encounter: Payer: Self-pay | Admitting: Family Medicine

## 2017-12-17 LAB — VITAMIN B12: VITAMIN B 12: 408 pg/mL (ref 232–1245)

## 2017-12-17 LAB — CBC WITH DIFFERENTIAL/PLATELET
BASOS ABS: 0.1 10*3/uL (ref 0.0–0.2)
Basos: 1 %
EOS (ABSOLUTE): 0.3 10*3/uL (ref 0.0–0.4)
Eos: 4 %
HEMOGLOBIN: 13.2 g/dL (ref 11.1–15.9)
Hematocrit: 40.6 % (ref 34.0–46.6)
IMMATURE GRANS (ABS): 0 10*3/uL (ref 0.0–0.1)
Immature Granulocytes: 0 %
LYMPHS: 40 %
Lymphocytes Absolute: 2.6 10*3/uL (ref 0.7–3.1)
MCH: 29.5 pg (ref 26.6–33.0)
MCHC: 32.5 g/dL (ref 31.5–35.7)
MCV: 91 fL (ref 79–97)
Monocytes Absolute: 0.4 10*3/uL (ref 0.1–0.9)
Monocytes: 7 %
NEUTROS ABS: 3.1 10*3/uL (ref 1.4–7.0)
NEUTROS PCT: 48 %
Platelets: 375 10*3/uL (ref 150–379)
RBC: 4.47 x10E6/uL (ref 3.77–5.28)
RDW: 13.7 % (ref 12.3–15.4)
WBC: 6.5 10*3/uL (ref 3.4–10.8)

## 2017-12-17 LAB — T4, FREE: Free T4: 0.92 ng/dL (ref 0.82–1.77)

## 2017-12-17 LAB — IRON: Iron: 45 ug/dL (ref 27–159)

## 2017-12-17 LAB — TSH: TSH: 13.91 u[IU]/mL — AB (ref 0.450–4.500)

## 2017-12-17 LAB — VITAMIN D 25 HYDROXY (VIT D DEFICIENCY, FRACTURES): VIT D 25 HYDROXY: 26.3 ng/mL — AB (ref 30.0–100.0)

## 2018-01-15 ENCOUNTER — Ambulatory Visit: Payer: BLUE CROSS/BLUE SHIELD | Admitting: Family Medicine

## 2018-01-20 DIAGNOSIS — E119 Type 2 diabetes mellitus without complications: Secondary | ICD-10-CM | POA: Diagnosis not present

## 2018-01-20 DIAGNOSIS — H35352 Cystoid macular degeneration, left eye: Secondary | ICD-10-CM | POA: Diagnosis not present

## 2018-01-20 DIAGNOSIS — H40013 Open angle with borderline findings, low risk, bilateral: Secondary | ICD-10-CM | POA: Diagnosis not present

## 2018-01-20 DIAGNOSIS — H25013 Cortical age-related cataract, bilateral: Secondary | ICD-10-CM | POA: Diagnosis not present

## 2018-01-29 ENCOUNTER — Other Ambulatory Visit: Payer: Self-pay

## 2018-01-29 ENCOUNTER — Ambulatory Visit: Payer: BLUE CROSS/BLUE SHIELD | Admitting: Family Medicine

## 2018-01-29 VITALS — BP 134/80 | HR 82 | Temp 98.4°F | Ht 62.8 in | Wt 200.4 lb

## 2018-01-29 DIAGNOSIS — F419 Anxiety disorder, unspecified: Secondary | ICD-10-CM | POA: Diagnosis not present

## 2018-01-29 DIAGNOSIS — E890 Postprocedural hypothyroidism: Secondary | ICD-10-CM

## 2018-01-29 DIAGNOSIS — Z9884 Bariatric surgery status: Secondary | ICD-10-CM

## 2018-01-29 DIAGNOSIS — L304 Erythema intertrigo: Secondary | ICD-10-CM

## 2018-01-29 DIAGNOSIS — N920 Excessive and frequent menstruation with regular cycle: Secondary | ICD-10-CM | POA: Diagnosis not present

## 2018-01-29 DIAGNOSIS — F32A Depression, unspecified: Secondary | ICD-10-CM

## 2018-01-29 DIAGNOSIS — F329 Major depressive disorder, single episode, unspecified: Secondary | ICD-10-CM

## 2018-01-29 NOTE — Patient Instructions (Addendum)
  CALL GYNECOLOGIST FOR APPOINTMENT.   IF you received an x-ray today, you will receive an invoice from [facility]. Please contact [facility] at [phone] with questions or concerns regarding your invoice.   IF you received labwork today, you will receive an invoice from [place]. Please contact [facility] at [phone] with questions or concerns regarding your invoice.   Our billing staff will not be able to assist you with questions regarding bills from these companies.  You will be contacted with the lab results as soon as they are available. The fastest way to get your results is to activate your My Chart account. Instructions are located on the last page of this paperwork. If you have not heard from [place] regarding the results in 2 weeks, please contact this office.

## 2018-01-29 NOTE — Progress Notes (Signed)
Subjective:    Patient ID: [name], female    DOB: [birth year], 42 y.o.   MRN: [id number]  01/29/2018  Follow-up (Follow up on chronic medical conditions)    HPI This 42 y.o. female presents for six weeks follow-up of glucose intolerance, hypothyroidism, R shoulder pain, coccyx pain, s/p gastric bypass, intertrigo.  Management changes made at last visit include the following:  New onset dysmenorrhea the past 3 months.  Patient desires pregnancy.  Recommend gynecological consultation for pelvic ultrasound to evaluate for uterine fibroids.  Recommend supportive care with Tylenol as needed.  With weight loss, menses regular despite history of polycystic ovarian disease. Post ablative hypothyroidism: Uncontrolled.  Patient reports improved compliance with levothyroxine therapy.  Repeat labs today.  Continue current dose. Right shoulder pain: New onset yet old injury 20 years ago.  Obtain right shoulder films.  Recommend rest, icing, home exercise program.  If no improvement in 1 month, call for orthopedic referral. Coccyx pain: New.  No recent trauma he had old trauma 20 years ago.  With recent weight loss in the past year, patient suffering with pain in her coccyx region.  Obtain toxic film.  Supportive care with donut cushion while sitting. Status post gastric bypass: Congratulations on ongoing weight loss.  Epigastric pain postprandial has resolved with daily Protonix use.  Benign abdominal exam. Intertrigo: Improvement with colder weather.  Work limitations or restrictions lifted and removed.  Letter provided to patient to provide to her employer stating that she has no restrictions to work activities.   UPDATE:  Tummy is good; now able to eat more.  Abdominal pain is much better.   Has lost 130 pounds total.    Cannot finish whole plate.  Can eat 2 eggs now.  2 ounces beans, avacado. Can eat one tortilla.   Not eating junky food.  Watches calories before buys something.     Received raise today; not happy with raise.   Attitude is better with Prozac; smiling; no fighting.  Motivation daily; focused on job.  Not distracted by other coworkers. Worries about pt only. Learning.   Menorrhagia: heavy bleeding 2-3 days and then light bleeding 4 days.  Really heavy for 3 days.  Once per month.  Must change every hour or two; started six months ago.   Forgot to call gynecolgoist.     BP Readings from Last 3 Encounters:  01/29/18 134/80  12/16/17 122/80  11/18/17 122/68   Wt Readings from Last 3 Encounters:  01/29/18 200 lb 6.4 oz (90.9 kg)  12/16/17 200 lb (90.7 kg)  11/18/17 202 lb (91.6 kg)   Immunization History  Administered Date(s) Administered  . Influenza Split 06/26/2017  . Influenza,inj,Quad PF,6+ Mos 06/29/2015, 07/24/2016, 10/14/2017  . Pneumococcal Polysaccharide-23 06/29/2015  . Tdap 06/29/2015    Review of Systems  Constitutional: Negative for chills, diaphoresis, fatigue and fever.  Eyes: Negative for visual disturbance.  Respiratory: Negative for cough and shortness of breath.   Cardiovascular: Negative for chest pain, palpitations and leg swelling.  Gastrointestinal: Negative for abdominal distention, abdominal pain, anal bleeding, blood in stool, constipation, diarrhea, nausea, rectal pain and vomiting.  Endocrine: Negative for cold intolerance, heat intolerance, polydipsia, polyphagia and polyuria.  Genitourinary: Positive for vaginal bleeding.  Neurological: Negative for dizziness, tremors, seizures, syncope, facial asymmetry, speech difficulty, weakness, light-headedness, numbness and headaches.  Psychiatric/Behavioral: Negative for dysphoric mood. The patient is not nervous/anxious.     Past Medical History:  Diagnosis Date  . Carpal tunnel syndrome,  bilateral   . Depression   . GERD (gastroesophageal reflux disease)   . H. pylori infection    dx  09-02-2014-- treated w/ antibiotic  . Hypertension    recently low pressures   .  Irregular menstrual cycle   . Menorrhagia   . PCOS (polycystic ovarian syndrome)   . Type 2 diabetes mellitus ([place])    Past Surgical History:  Procedure Laterality Date  . CARPAL TUNNEL RELEASE Bilateral 10/07/2014   Procedure: CARPAL TUNNEL RELEASE BILATERAL;  Surgeon: [name], MD;  Location: [facility];  Service: Orthopedics;  Laterality: Bilateral;  . CESAREAN SECTION  1995  . GASTRIC ROUX-EN-Y N/A 03/12/2017   Procedure: LAPAROSCOPIC ROUX-EN-Y GASTRIC BYPASS WITH UPPER ENDOSCOPY;  Surgeon: Kieth Brightly, Arta Bruce, MD;  Location: [facility];  Service: General;  Laterality: N/A;  . UPPER GI ENDOSCOPY  03/12/2017   Procedure: UPPER GI ENDOSCOPY;  Surgeon: Kieth Brightly Arta Bruce, MD;  Location: [facility];  Service: General;;   Allergies  Allergen Reactions  . Food     RAW/FRESH FRUIT--PEACHES, PEARS, APPLES=THROAT CLOSES/SWELLING & COUGHING SHORTNESS OF BREATH  . 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 Shrimp [Shellfish Allergy] Anaphylaxis   Current Outpatient Medications on File Prior to Visit  Medication Sig Dispense Refill  . clindamycin (CLINDAGEL) 1 % gel Apply topically 2 (two) times daily. To face. 30 g 1  . desonide (DESOWEN) 0.05 % cream APPLY  CREAM TOPICALLY TWICE DAILY 15 g 0  . FLUoxetine (PROZAC) 20 MG tablet Take 1 tablet (20 mg total) by mouth daily. 90 tablet 1  . fluticasone (FLONASE) 50 MCG/ACT nasal spray Place 2 sprays into both nostrils daily. 16 g 6  . glucose blood (FREESTYLE TEST STRIPS) test strip Please fill with preferred strip and check sugar once daily 100 each 4  . hydrocortisone (ANUSOL-HC) 25 MG suppository Place 1 suppository (25 mg total) rectally 2 (two) times daily. 12 suppository 0  . levothyroxine (SYNTHROID, LEVOTHROID) 100 MCG tablet Take 1 tablet (100 mcg total) by mouth daily. 90 tablet 3  . loratadine (CLARITIN) 10 MG tablet Take 1 tablet (10 mg total) by mouth daily. 90 tablet 3  . nystatin (NYSTATIN) powder Apply 1 g topically 2  (two) times daily. 56 g 2  . nystatin cream (MYCOSTATIN) Apply 1 application topically 2 (two) times daily. 30 g 5  . NYSTATIN powder APPLY 1 GRAM TOPICALLY TWO TIMES DAILY 1 Bottle 1  . Olopatadine HCl 0.2 % SOLN Apply 1 drop to eye daily. (Patient taking differently: Apply 1 drop to eye daily as needed (for conjuctivitis). ) 2.5 mL 2  . pantoprazole (PROTONIX) 40 MG tablet Take 1 tablet (40 mg total) by mouth daily. 30 tablet 1   No current facility-administered medications on file prior to visit.    Social History   Socioeconomic History  . Marital status: Married    Spouse name: Not on file  . Number of children: 2  . Years of education: Not on file  . Highest education level: Not on file  Occupational History  . Not on file  Social Needs  . Financial resource strain: Not on file  . Food insecurity:    Worry: Not on file    Inability: Not on file  . Transportation needs:    Medical: Not on file    Non-medical: Not on file  Tobacco Use  . Smoking status: Never Smoker  . Smokeless tobacco: Never Used  Substance and Sexual Activity  . Alcohol  use: Yes    Alcohol/week: 0.0 oz    Comment: socially  . Drug use: No  . Sexual activity: Yes    Partners: Male    Birth control/protection: None  Lifestyle  . Physical activity:    Days per week: Not on file    Minutes per session: Not on file  . Stress: Not on file  Relationships  . Social connections:    Talks on phone: Not on file    Gets together: Not on file    Attends religious service: Not on file    Active member of club or organization: Not on file    Attends meetings of clubs or organizations: Not on file    Relationship status: Not on file  . Intimate partner violence:    Fear of current or ex partner: Not on file    Emotionally abused: Not on file    Physically abused: Not on file    Forced sexual activity: Not on file  Other Topics Concern  . Not on file  Social History Narrative   Marital status: married  x 1 year; from [place].  Moved to [place] since 2000 from [place]; born in [place].      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        Employment:  [employer] full time;       Tobacco: none      Alcohol: social      Drugs: none      Exercise: none   No reported caffeine use    Family History  Problem Relation Age of Onset  . Heart disease Mother 76       CAD  . Diabetes Mother   . Hypertension Mother   . Cancer Father 92       prostate cancer  . Diabetes Father   . Thyroid disease Neg Hx        Objective:    BP 134/80 (BP Location: Left Arm, Patient Position: Sitting, Cuff Size: Normal)   Pulse 82   Temp 98.4 F (36.9 C) (Oral)   Ht 5' 2.8" (1.595 m)   Wt 200 lb 6.4 oz (90.9 kg)   LMP 01/22/2018   SpO2 98%   BMI 35.73 kg/m  Physical Exam  Constitutional: She is oriented to person, place, and time. She appears well-developed and well-nourished. No distress.  HENT:  Head: Normocephalic and atraumatic.  Right Ear: External ear normal.  Left Ear: External ear normal.  Nose: Nose normal.  Mouth/Throat: Oropharynx is clear and moist.  Eyes: Pupils are equal, round, and reactive to light. Conjunctivae and EOM are normal.  Neck: Normal range of motion. Neck supple. Carotid bruit is not present. No thyromegaly present.  Cardiovascular: Normal rate, regular rhythm, normal heart sounds and intact distal pulses. Exam reveals no gallop and no friction rub.  No murmur heard. Pulmonary/Chest: Effort normal and breath sounds normal. She has no wheezes. She has no rales.  Abdominal: Soft. Bowel sounds are normal. She exhibits no distension and no mass. There is no tenderness. There is no rebound and no guarding.  Lymphadenopathy:    She has no cervical adenopathy.  Neurological: She is alert and oriented to person, place, and time. No cranial nerve deficit.  Skin: Skin is warm and dry. No  rash noted. She is not diaphoretic. No erythema. No pallor.  Psychiatric: She has a normal mood and affect. Her behavior is normal.   No results found. Depression screen [facility] 2/9 01/29/2018 01/29/2018 12/16/2017 11/18/2017 10/14/2017  Decreased Interest 0 0 0 0 0  Down, Depressed, Hopeless 0 0 0 0 0  PHQ - 2 Score 0 0 0 0 0  Altered sleeping - - - - -  Tired, decreased energy - - - - -  Change in appetite - - - - -  Feeling bad or failure about yourself  - - - - -  Trouble concentrating - - - - -  Moving slowly or fidgety/restless - - - - -  Suicidal thoughts - - - - -  PHQ-9 Score - - - - -  Some recent data might be hidden   Fall Risk  01/29/2018 01/29/2018 12/16/2017 11/18/2017 11/04/2017  Falls in the past year? No No No No No  Number falls in past yr: - - - - -  Injury with [place]:   1. Postablative hypothyroidism   2. Intertrigo   3. S/P gastric bypass   4. Menorrhagia with regular cycle   5. Anxiety and depression     Post ablative hypothyroidism: Uncontrolled.  Patient reports improved compliance with Synthroid therapy.  Repeat labs today.  Morbid obesity status post gastric bypass: Congratulations on weight loss.  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  Referral to plastic surgery for consultation.  Menorrhagia with regular cycles: Persistent and worsening.  Patient to contact gynecology today to undergo evaluation.  Anxiety and depression: Improved with fluoxetine 20 mg daily.  Irritability has greatly improved.  Intertrigo: Chronic recurrent issue for patient especially during the summer months.  Well-controlled today.  Discussed preventative measures today including antiperspirant to areas including under breasts and in bilateral inguinal regions during the summer.  Also recommend a barrier cream like Desitin or a and D ointment.  Orders Placed This Encounter  Procedures    . TSH  . T4, free  . Ambulatory referral to Plastic Surgery    Referral Priority:   Routine    Referral Type:   Surgical    Referral Reason:   Specialty Services Required    Requested Specialty:   Plastic Surgery    Number of Visits Requested:   1   No orders of the defined types were placed in this encounter.   No follow-ups on file.   [name] Elayne Guerin, M.D. Primary Care at [facility] [address] [place], [place]  [address] [phone] phone [phone] fax

## 2018-01-30 LAB — TSH: TSH: 10.37 u[IU]/mL — AB (ref 0.450–4.500)

## 2018-01-30 LAB — T4, FREE: Free T4: 0.93 ng/dL (ref 0.82–1.77)

## 2018-02-12 ENCOUNTER — Encounter: Payer: Self-pay | Admitting: Gynecology

## 2018-02-12 ENCOUNTER — Ambulatory Visit: Payer: BLUE CROSS/BLUE SHIELD | Admitting: Gynecology

## 2018-02-12 ENCOUNTER — Other Ambulatory Visit: Payer: Self-pay | Admitting: Gynecology

## 2018-02-12 VITALS — BP 118/76 | Ht 62.0 in | Wt 201.0 lb

## 2018-02-12 DIAGNOSIS — N924 Excessive bleeding in the premenopausal period: Secondary | ICD-10-CM | POA: Diagnosis not present

## 2018-02-12 DIAGNOSIS — E039 Hypothyroidism, unspecified: Secondary | ICD-10-CM | POA: Diagnosis not present

## 2018-02-12 DIAGNOSIS — N939 Abnormal uterine and vaginal bleeding, unspecified: Secondary | ICD-10-CM

## 2018-02-12 DIAGNOSIS — D259 Leiomyoma of uterus, unspecified: Secondary | ICD-10-CM | POA: Diagnosis not present

## 2018-02-12 DIAGNOSIS — Z01419 Encounter for gynecological examination (general) (routine) without abnormal findings: Secondary | ICD-10-CM | POA: Diagnosis not present

## 2018-02-12 NOTE — Progress Notes (Signed)
[name] 06-Jul-1976 [id number]        42 y.o.  [id number] for annual gynecologic exam.  Former patient of Dr. Toney Rakes.  Patient also complaining of heavy menses with dysmenorrhea.  She notes that her periods have always been on the heavier side but since having bariatric surgery May 2018 her periods have gotten heavier and she is having a lot more discomfort with cramping for approximately 2 days during each cycle.  Her menses occur monthly although they seem closer together now every 3 weeks.  No intermenstrual bleeding.  Has lost 80 pounds since her surgery in May.  Also not using contraception and wanting to pursue pregnancy.  Has a history of cesarean section x2 and reportedly has had normal workup in the past to include tubal studies.  Lastly review of her records show her most recent TSH 10.3 earlier this month.  It had been 75.9 in November and they are adjusting her medication by her history.  Past medical history,surgical history, problem list, medications, allergies, family history and social history were all reviewed and documented as reviewed in the EPIC chart.  ROS:  Performed with pertinent positives and negatives included in the history, assessment and plan.   Additional significant findings : None   Exam: [name] assistant Vitals:   02/12/18 0925  BP: 118/76  Weight: 201 lb (91.2 kg)  Height: 5\' 2"  (1.575 m)   Body mass index is 36.76 kg/m.  General appearance:  Normal affect, orientation and appearance. Skin: Grossly normal HEENT: Without gross lesions.  No cervical or supraclavicular adenopathy. Thyroid normal.  Lungs:  Clear without wheezing, rales or rhonchi Cardiac: RR, without RMG Abdominal:  Soft, nontender, without masses, guarding, rebound, organomegaly or hernia Breasts:  Examined lying and sitting without masses, retractions, discharge or axillary adenopathy. Pelvic:  Ext, BUS, Vagina: With moderate menses flow  Cervix: Grossly normal with  menses flow  Uterus: Grossly normal size midline mobile nontender  Adnexa: Without masses or tenderness    Anus and perineum: Normal   Rectovaginal: Normal sphincter tone without palpated masses or tenderness.    Assessment/Plan:  42 y.o. G18P1102 female for annual gynecologic exam with monthly menses, no contraception.   1. Menorrhagia/dysmenorrhea.  Seems worse since her surgery.  I reviewed the issues of bariatric surgery, weight loss and ovulatory function.  She has the diagnosis of PCOS in the past.  I discussed with her with such rapid weight loss her ovarian function may be more consistent leading to more symptoms of pain with her menses.  Alternatives to include endometriosis and cavitary abnormalities such as submucous myomas and polyps were also discussed.  She does have an ultrasound in the past that shows small myomas although her uterus grossly palpates normal today.  I also reviewed her thyroid dysfunction may be contributing to her menstrual heaviness and that she needs to continue to follow-up with her other physicians to adjust her thyroid medication to get her euthyroid.  Her last hemoglobin in February was 13.  She is keeping up with her blood loss and I encouraged her to continue with her dietary/vitamin supplements.  Her last iron level was 45 in February which is in the normal range.  Recommend we start with sonohysterogram for pelvic surveillance and cavitary evaluation she is going to schedule in follow-up for this. 2. Pap smear 2015.  Unable to do Pap smear today due to heavy bleeding.  Will do her Pap smear when she returns for the sonohysterogram.  No history of abnormal Pap smears previously. 3. Infertility.  Patient states that she has been unable to achieve pregnancy since she delivered her last child at age 68.  Reports having been worked up previously.  We discussed that she should not pursue pregnancy for approaching 2 years from the surgery which was the surgeon's  recommendation..  I discussed that we need to wait till her weight stabilizes and then reassess her menstrual status and at that point pursue infertility evaluation if she remains without pregnancy.  Certainly possible that she was having significant ovulatory dysfunction previously due to her weight and that with this weight loss that may correct this. 4. Mammography never.  I recommended the patient schedule a screening mammogram.  Names and numbers provided.  Breast exam normal today. 5. Health maintenance.  No routine lab work done as this is all being done elsewhere.  Follow-up for sonohysterogram, sooner if any issues.  Additional 10 minutes plus in excess of her routine gynecologic exam was spent in direct face to face counseling and coordination of care in regards to her menorrhagia, dysmenorrhea and fertility questions.    Anastasio Auerbach MD, 10:06 AM 02/12/2018

## 2018-02-12 NOTE — Patient Instructions (Signed)
Follow-up for the ultrasound as scheduled.  Call to Schedule your mammogram  Facilities in [place]: 1)  [facility]. [address] [place] Phone: [phone] 2)  Dr. Isaiah Blakes at [facility] [address] Phone: [phone]

## 2018-02-18 ENCOUNTER — Ambulatory Visit (INDEPENDENT_AMBULATORY_CARE_PROVIDER_SITE_OTHER): Payer: BLUE CROSS/BLUE SHIELD

## 2018-02-18 ENCOUNTER — Encounter: Payer: Self-pay | Admitting: Gynecology

## 2018-02-18 ENCOUNTER — Ambulatory Visit: Payer: BLUE CROSS/BLUE SHIELD | Admitting: Gynecology

## 2018-02-18 VITALS — BP 118/76

## 2018-02-18 DIAGNOSIS — N939 Abnormal uterine and vaginal bleeding, unspecified: Secondary | ICD-10-CM

## 2018-02-18 DIAGNOSIS — N924 Excessive bleeding in the premenopausal period: Secondary | ICD-10-CM | POA: Diagnosis not present

## 2018-02-18 DIAGNOSIS — D259 Leiomyoma of uterus, unspecified: Secondary | ICD-10-CM

## 2018-02-18 DIAGNOSIS — N946 Dysmenorrhea, unspecified: Secondary | ICD-10-CM

## 2018-02-18 NOTE — Progress Notes (Signed)
    [name] Nov 07, 1975 [id number]        42 y.o.  [id number] presents for sonohysterogram due to heavier menses and dysmenorrhea.  Underwent bariatric surgery and has been losing a lot of weight.  Also is having her thyroid adjusted noting her last TSH last month was 10.3.  Past medical history,surgical history, problem list, medications, allergies, family history and social history were all reviewed and documented in the EPIC chart.  Directed ROS with pertinent positives and negatives documented in the history of present illness/assessment and plan.  Exam: [place] assistant Vitals:   02/18/18 1651  BP: 118/76   General appearance:  Normal Abdomen soft nontender without masses guarding rebound Pelvic external BUS vagina normal.  Cervix normal.  Uterus difficult to palpate but no gross masses or tenderness.  Adnexa without masses or tenderness.  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  Endometrial echo 6.1 mm.  Right and left ovaries grossly normal.  Cul-de-sac with fluid pocket noted 43 x 16 mm.  Sonohysterogram performed, sterile technique, easy catheter introduction, good distention with no abnormality seen.  Endometrial biopsy taken.  Patient tolerated well.  Assessment/Plan:  42 y.o. [id number] with heavier menses and dysmenorrhea.  Ultrasound shows several small myomas.  Endometrial cavity is normal.  Endometrial biopsy taken.  I discussed with patient I think her heavier menses are probably secondary to her weight changes and thyroid dysfunction.  Various options for intervention to include hormonal manipulation/Mirena IUD discussed but at this point we both agreed to monitoring and allow for her weight to stabilize and her to achieve euthyroid state and then see how her menses are.  She also is interested in pursuing pregnancy and we discussed waiting until her weight stabilizes noting that her  bariatric surgeons recommend close to 2 years of waiting before pursuing pregnancy.  Will follow-up for biopsy results and then we will plan on expectant management for now assuming it is negative.    Anastasio Auerbach MD, 5:07 PM 02/18/2018

## 2018-02-18 NOTE — Patient Instructions (Signed)
Office will call you with biopsy results 

## 2018-03-18 ENCOUNTER — Encounter: Payer: BLUE CROSS/BLUE SHIELD | Attending: General Surgery | Admitting: Registered"

## 2018-03-18 DIAGNOSIS — Z9884 Bariatric surgery status: Secondary | ICD-10-CM | POA: Insufficient documentation

## 2018-03-18 DIAGNOSIS — E669 Obesity, unspecified: Secondary | ICD-10-CM

## 2018-03-18 DIAGNOSIS — Z713 Dietary counseling and surveillance: Secondary | ICD-10-CM | POA: Diagnosis not present

## 2018-03-20 ENCOUNTER — Encounter: Payer: Self-pay | Admitting: Family Medicine

## 2018-03-21 NOTE — Progress Notes (Signed)
Bariatric Class:  Appt start time: 6:00 end time: 7:00  12 Month Post-Operative Nutrition Class  Patient was seen on 03/18/2018 for Post[facility]ative Nutrition [facility]   Surgery date: 03/12/2017 Surgery type: RYGB Start weight at [facility]: 282 Weight today: 202.0  Wt change: 10.6 lbs from 212.6 (08/21/2017) Total weight loss: 80 lbs   TANITA  BODY COMP RESULTS  03/27/2017 04/29/2017 05/22/2017 08/21/2017 03/18/2018   BMI (kg/m^2) 46.9 44.5 43.3 38.9 36.9   Fat Mass (lbs) 129.6 111.8 105.6 90.8 78.0   Fat Free Mass (lbs) 127 131.6 131.2 121.8 124.0   Total Body Water (lbs) 93.8 96.4 95.8 88.2 89.2    The following the learning objectives were met by the patient during this course:  Review of TANITA scale information  Share and discuss bariatric surgery successes and non-scale victories  Identifies Phase VII (Maintenance Phase) Dietary Goals which will be lifelong  Identifies appropriate sources of fluids, proteins, non-starchy vegetables, and complex carbohydrates  Identifies well-balanced meals  Identifies portion control   Identifies appropriate multivitamin and calcium sources post-operatively  Describes the need for physical activity post-operatively and will follow MD recommendations  Identifies and describes SMART goals   Creates at least 2 SMART goals to begin immediately  States when to call healthcare provider regarding medication questions or post-operative complications  Handouts given during class include:  Phase VII: Maintenance Phase-Lifelong  Follow-Up Plan: Patient will follow-up at [facility] for on-going post-op nutrition visits.

## 2018-03-25 ENCOUNTER — Encounter: Payer: Self-pay | Admitting: Family Medicine

## 2018-04-30 ENCOUNTER — Ambulatory Visit: Payer: BLUE CROSS/BLUE SHIELD | Admitting: Family Medicine

## 2018-04-30 ENCOUNTER — Ambulatory Visit: Payer: Self-pay | Admitting: *Deleted

## 2018-04-30 ENCOUNTER — Other Ambulatory Visit: Payer: Self-pay

## 2018-04-30 ENCOUNTER — Encounter: Payer: Self-pay | Admitting: Family Medicine

## 2018-04-30 VITALS — BP 140/84 | HR 70 | Temp 98.7°F | Resp 18 | Ht 62.0 in | Wt 200.0 lb

## 2018-04-30 DIAGNOSIS — R739 Hyperglycemia, unspecified: Secondary | ICD-10-CM

## 2018-04-30 DIAGNOSIS — N760 Acute vaginitis: Secondary | ICD-10-CM | POA: Diagnosis not present

## 2018-04-30 DIAGNOSIS — E890 Postprocedural hypothyroidism: Secondary | ICD-10-CM

## 2018-04-30 DIAGNOSIS — R102 Pelvic and perineal pain: Secondary | ICD-10-CM

## 2018-04-30 DIAGNOSIS — B9689 Other specified bacterial agents as the cause of diseases classified elsewhere: Secondary | ICD-10-CM | POA: Diagnosis not present

## 2018-04-30 DIAGNOSIS — B3749 Other urogenital candidiasis: Secondary | ICD-10-CM | POA: Diagnosis not present

## 2018-04-30 DIAGNOSIS — Z124 Encounter for screening for malignant neoplasm of cervix: Secondary | ICD-10-CM | POA: Diagnosis not present

## 2018-04-30 DIAGNOSIS — N898 Other specified noninflammatory disorders of vagina: Secondary | ICD-10-CM

## 2018-04-30 DIAGNOSIS — R319 Hematuria, unspecified: Secondary | ICD-10-CM

## 2018-04-30 LAB — POCT URINE PREGNANCY: Preg Test, Ur: NEGATIVE

## 2018-04-30 LAB — POCT WET + KOH PREP: TRICH BY WET PREP: ABSENT

## 2018-04-30 LAB — POCT URINALYSIS DIP (MANUAL ENTRY)
Bilirubin, UA: NEGATIVE
GLUCOSE UA: NEGATIVE mg/dL
Ketones, POC UA: NEGATIVE mg/dL
NITRITE UA: NEGATIVE
PROTEIN UA: NEGATIVE mg/dL
RBC UA: NEGATIVE
Spec Grav, UA: 1.015 (ref 1.010–1.025)
UROBILINOGEN UA: 0.2 U/dL
pH, UA: 7 (ref 5.0–8.0)

## 2018-04-30 MED ORDER — METRONIDAZOLE 0.75 % VA GEL: 1.0000 | Freq: Two times a day (BID) | VAGINAL | 0 refills | Status: DC

## 2018-04-30 MED ORDER — AMOXICILLIN-POT CLAVULANATE 875-125 MG PO TABS: 1.0000 | ORAL_TABLET | Freq: Two times a day (BID) | ORAL | 0 refills | Status: DC
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
## 2018-04-30 NOTE — Telephone Encounter (Signed)
Pt called this AM 0720 to report hematuria. Appt made by agent with Dr. Tamala Julian for 1520 today. Pt returned call 0920, reports she was at work and EMS was called as she experienced "Severe, shooting pain, just one time" across lower abdomen. Pt refused transport to ED. During episode pt states EMS reported B/P 180/?...BS 191. Presently, no further pain, "Just that one bad one."  States pain was below umbilicus, radiating to "Both sides where ovaries are at." Does not radiate to flank/back. Pt reports urinary frequency and urgency. States 'Light red blood" no clots.  Denies dizziness except "Right before I had that pain."  Afebrile. NT clarifies she is not having pain presently or since initial episode.  Pt has H/O "premenopausal excessive bleeding." States she is sure bleeding is urinary and not vaginal. Spoke with Tanya at office as pt had refused ED transport. Care advise given per protocol; instructed pt to go to ED if symptoms worsen. Reason for Disposition . Blood in urine  (Exception: could be normal menstrual bleeding)  Answer Assessment - Initial Assessment Questions 1. COLOR of URINE: "Describe the color of the urine."  (e.g., tea-colored, pink, red, blood clots, bloody)     Light red 2. ONSET: "When did the bleeding start?"      This Am 0345 3. EPISODES: "How many times has there been blood in the urine?" or "How many times today?"     7-8 times 4. PAIN with URINATION: "Is there any pain with passing your urine?" If so, ask: "How bad is the pain?"  (Scale 1-10; or mild, moderate, severe)    - MILD - complains slightly about urination hurting    - MODERATE - interferes with normal activities      - SEVERE - excruciating, unwilling or unable to urinate because of the pain      0730 at work, shooting pain, below umbilicus to bladder area, "Ovaries" 5. FEVER: "Do you have a fever?" If so, ask: "What is your temperature, how was it measured, and when did it start?"     no 6. ASSOCIATED SYMPTOMS:  "Are you passing urine more frequently than usual?"     Yes 7. OTHER SYMPTOMS: "Do you have any other symptoms?" (e.g., back/flank pain, abdominal pain, vomiting)     Abdominal pain, dizziness prior to pain  Protocols used: URINE - BLOOD IN-A-AH

## 2018-04-30 NOTE — Patient Instructions (Signed)
     IF you received an x-ray today, you will receive an invoice from [facility]. Please contact  Radiology at [phone] with questions or concerns regarding your invoice.   IF you received labwork today, you will receive an invoice from [facility]. Please contact [facility] at [phone] with questions or concerns regarding your invoice.   Our billing staff will not be able to assist you with questions regarding bills from these companies.  You will be contacted with the lab results as soon as they are available. The fastest way to get your results is to activate your My Chart account. Instructions are located on the last page of this paperwork. If you have not heard from us regarding the results in 2 weeks, please contact this office.     

## 2018-05-01 LAB — COMPREHENSIVE METABOLIC PANEL
ALBUMIN: 4.1 g/dL (ref 3.5–5.5)
ALT: 21 IU/L (ref 0–32)
AST: 19 IU/L (ref 0–40)
Albumin/Globulin Ratio: 1.4 (ref 1.2–2.2)
Alkaline Phosphatase: 77 IU/L (ref 39–117)
BILIRUBIN TOTAL: 0.4 mg/dL (ref 0.0–1.2)
BUN / CREAT RATIO: 18 (ref 9–23)
BUN: 14 mg/dL (ref 6–24)
CHLORIDE: 103 mmol/L (ref 96–106)
CO2: 21 mmol/L (ref 20–29)
Calcium: 8.8 mg/dL (ref 8.7–10.2)
Creatinine, Ser: 0.79 mg/dL (ref 0.57–1.00)
GFR calc Af Amer: 108 mL/min/{1.73_m2} (ref >59)
GFR calc non Af Amer: 93 mL/min/{1.73_m2} (ref >59)
GLOBULIN, TOTAL: 2.9 g/dL (ref 1.5–4.5)
Glucose: 89 mg/dL (ref 65–99)
Potassium: 3.8 mmol/L (ref 3.5–5.2)
SODIUM: 141 mmol/L (ref 134–144)
Total Protein: 7 g/dL (ref 6.0–8.5)

## 2018-05-01 LAB — CBC WITH DIFFERENTIAL/PLATELET
BASOS ABS: 0 10*3/uL (ref 0.0–0.2)
Basos: 1 %
EOS (ABSOLUTE): 0.2 10*3/uL (ref 0.0–0.4)
Eos: 2 %
Hematocrit: 38.1 % (ref 34.0–46.6)
Hemoglobin: 12.5 g/dL (ref 11.1–15.9)
Immature Grans (Abs): 0 10*3/uL (ref 0.0–0.1)
Immature Granulocytes: 0 %
LYMPHS ABS: 2.3 10*3/uL (ref 0.7–3.1)
Lymphs: 35 %
MCH: 28.8 pg (ref 26.6–33.0)
MCHC: 32.8 g/dL (ref 31.5–35.7)
MCV: 88 fL (ref 79–97)
MONOCYTES: 7 %
MONOS ABS: 0.5 10*3/uL (ref 0.1–0.9)
Neutrophils Absolute: 3.7 10*3/uL (ref 1.4–7.0)
Neutrophils: 55 %
PLATELETS: 311 10*3/uL (ref 150–450)
RBC: 4.34 x10E6/uL (ref 3.77–5.28)
RDW: 14.2 % (ref 12.3–15.4)
WBC: 6.8 10*3/uL (ref 3.4–10.8)

## 2018-05-01 LAB — TSH: TSH: 34.15 u[IU]/mL — ABNORMAL HIGH (ref 0.450–4.500)

## 2018-05-01 LAB — URINALYSIS, MICROSCOPIC ONLY: CASTS: NONE SEEN /LPF

## 2018-05-01 LAB — HEMOGLOBIN A1C
ESTIMATED AVERAGE GLUCOSE: 103 mg/dL
HEMOGLOBIN A1C: 5.2 % (ref 4.8–5.6)

## 2018-05-01 LAB — GC/CHLAMYDIA PROBE AMP
CHLAMYDIA, DNA PROBE: NEGATIVE
Neisseria gonorrhoeae by PCR: NEGATIVE

## 2018-05-01 LAB — T4, FREE: Free T4: 0.78 ng/dL — ABNORMAL LOW (ref 0.82–1.77)

## 2018-05-02 LAB — URINE CULTURE

## 2018-05-03 MED ORDER — FLUCONAZOLE 150 MG PO TABS: 150.0000 mg | ORAL_TABLET | Freq: Once | ORAL | 0 refills | Status: AC

## 2018-05-06 LAB — PAP IG, CT-NG NAA, HPV HIGH-RISK
CHLAMYDIA, NUC. ACID AMP: NEGATIVE
Gonococcus by Nucleic Acid Amp: NEGATIVE
HPV, HIGH-RISK: NEGATIVE
PAP Smear Comment: 0

## 2018-05-09 ENCOUNTER — Telehealth: Payer: Self-pay

## 2018-05-09 NOTE — Telephone Encounter (Signed)
Copied from [place] [phone]. Topic: General - Other >> May 08, 2018  3:48 PM Synthia Innocent wrote: Per [employer] patient wants to be scheduled for CT scan at [facility] Fax # [phone]  Referral in, sent to Referrals to check on

## 2018-05-15 ENCOUNTER — Ambulatory Visit: Payer: BLUE CROSS/BLUE SHIELD | Admitting: Family Medicine

## 2018-05-31 ENCOUNTER — Encounter: Payer: Self-pay | Admitting: Family Medicine

## 2018-07-18 NOTE — Telephone Encounter (Signed)
error 

## 2018-07-23 DIAGNOSIS — E119 Type 2 diabetes mellitus without complications: Secondary | ICD-10-CM | POA: Diagnosis not present

## 2018-07-23 DIAGNOSIS — H40013 Open angle with borderline findings, low risk, bilateral: Secondary | ICD-10-CM | POA: Diagnosis not present

## 2018-07-23 DIAGNOSIS — H25013 Cortical age-related cataract, bilateral: Secondary | ICD-10-CM | POA: Diagnosis not present

## 2018-07-23 DIAGNOSIS — H11001 Unspecified pterygium of right eye: Secondary | ICD-10-CM | POA: Diagnosis not present

## 2018-07-30 DIAGNOSIS — E619 Deficiency of nutrient element, unspecified: Secondary | ICD-10-CM | POA: Diagnosis not present

## 2018-08-10 ENCOUNTER — Inpatient Hospital Stay (HOSPITAL_COMMUNITY): Payer: BLUE CROSS/BLUE SHIELD

## 2018-08-10 ENCOUNTER — Other Ambulatory Visit: Payer: Self-pay

## 2018-08-10 ENCOUNTER — Inpatient Hospital Stay (HOSPITAL_COMMUNITY)
Admission: AD | Admit: 2018-08-10 | Discharge: 2018-08-10 | Disposition: A | Discharge status: Home / Self Care | Payer: BLUE CROSS/BLUE SHIELD | Attending: Family Medicine | Admitting: Family Medicine

## 2018-08-10 ENCOUNTER — Encounter (HOSPITAL_COMMUNITY): Payer: Self-pay

## 2018-08-10 DIAGNOSIS — R1030 Lower abdominal pain, unspecified: Secondary | ICD-10-CM | POA: Diagnosis not present

## 2018-08-10 DIAGNOSIS — O3680X0 Pregnancy with inconclusive fetal viability, not applicable or unspecified: Secondary | ICD-10-CM | POA: Diagnosis not present

## 2018-08-10 DIAGNOSIS — E119 Type 2 diabetes mellitus without complications: Secondary | ICD-10-CM | POA: Diagnosis not present

## 2018-08-10 DIAGNOSIS — O26891 Other specified pregnancy related conditions, first trimester: Secondary | ICD-10-CM | POA: Diagnosis not present

## 2018-08-10 DIAGNOSIS — O99841 Bariatric surgery status complicating pregnancy, first trimester: Secondary | ICD-10-CM | POA: Diagnosis not present

## 2018-08-10 DIAGNOSIS — O26899 Other specified pregnancy related conditions, unspecified trimester: Secondary | ICD-10-CM

## 2018-08-10 DIAGNOSIS — R109 Unspecified abdominal pain: Secondary | ICD-10-CM | POA: Insufficient documentation

## 2018-08-10 DIAGNOSIS — O10011 Pre-existing essential hypertension complicating pregnancy, first trimester: Secondary | ICD-10-CM | POA: Insufficient documentation

## 2018-08-10 DIAGNOSIS — Z3A01 Less than 8 weeks gestation of pregnancy: Secondary | ICD-10-CM | POA: Diagnosis not present

## 2018-08-10 DIAGNOSIS — O24111 Pre-existing diabetes mellitus, type 2, in pregnancy, first trimester: Secondary | ICD-10-CM | POA: Diagnosis not present

## 2018-08-10 DIAGNOSIS — O3411 Maternal care for benign tumor of corpus uteri, first trimester: Secondary | ICD-10-CM | POA: Diagnosis not present

## 2018-08-10 LAB — COMPREHENSIVE METABOLIC PANEL
ALBUMIN: 3.9 g/dL (ref 3.5–5.0)
ALK PHOS: 64 U/L (ref 38–126)
ALT: 16 U/L (ref 0–44)
AST: 19 U/L (ref 15–41)
Anion gap: 5 (ref 5–15)
BUN: 16 mg/dL (ref 6–20)
CALCIUM: 8.4 mg/dL — AB (ref 8.9–10.3)
CHLORIDE: 104 mmol/L (ref 98–111)
CO2: 26 mmol/L (ref 22–32)
CREATININE: 0.7 mg/dL (ref 0.44–1.00)
GFR calc non Af Amer: >60 mL/min (ref >60)
GLUCOSE: 87 mg/dL (ref 70–99)
Potassium: 3.5 mmol/L (ref 3.5–5.1)
SODIUM: 135 mmol/L (ref 135–145)
Total Bilirubin: 0.7 mg/dL (ref 0.3–1.2)
Total Protein: 7.3 g/dL (ref 6.5–8.1)

## 2018-08-10 LAB — CBC WITH DIFFERENTIAL/PLATELET
BASOS ABS: 0.1 10*3/uL (ref 0.0–0.1)
Basophils Relative: 1 %
Eosinophils Absolute: 0.2 10*3/uL (ref 0.0–0.5)
Eosinophils Relative: 2 %
HCT: 39.1 % (ref 36.0–46.0)
HEMOGLOBIN: 12.9 g/dL (ref 12.0–15.0)
LYMPHS ABS: 2.5 10*3/uL (ref 0.7–4.0)
LYMPHS PCT: 37 %
MCH: 28.9 pg (ref 26.0–34.0)
MCHC: 33 g/dL (ref 30.0–36.0)
MCV: 87.7 fL (ref 80.0–100.0)
Monocytes Absolute: 0.5 10*3/uL (ref 0.1–1.0)
Monocytes Relative: 7 %
NEUTROS PCT: 53 %
NRBC: 0 % (ref 0.0–0.2)
Neutro Abs: 3.7 10*3/uL (ref 1.7–7.7)
Platelets: 364 10*3/uL (ref 150–400)
RBC: 4.46 MIL/uL (ref 3.87–5.11)
RDW: 14.1 % (ref 11.5–15.5)
WBC: 6.9 10*3/uL (ref 4.0–10.5)

## 2018-08-10 LAB — URINALYSIS, ROUTINE W REFLEX MICROSCOPIC
BILIRUBIN URINE: NEGATIVE
Bacteria, UA: NONE SEEN
Glucose, UA: NEGATIVE mg/dL
Hgb urine dipstick: NEGATIVE
KETONES UR: NEGATIVE mg/dL
Nitrite: NEGATIVE
PH: 5 (ref 5.0–8.0)
Protein, ur: NEGATIVE mg/dL
Specific Gravity, Urine: 1.012 (ref 1.005–1.030)

## 2018-08-10 LAB — ABO/RH: ABO/RH(D): O POS

## 2018-08-10 LAB — WET PREP, GENITAL
CLUE CELLS WET PREP: NONE SEEN
SPERM: NONE SEEN
Trich, Wet Prep: NONE SEEN
Yeast Wet Prep HPF POC: NONE SEEN

## 2018-08-10 LAB — POCT PREGNANCY, URINE: Preg Test, Ur: POSITIVE — AB

## 2018-08-10 LAB — HCG, QUANTITATIVE, PREGNANCY: HCG, BETA CHAIN, QUANT, S: 7436 m[IU]/mL — AB (ref <5)

## 2018-08-10 NOTE — MAU Note (Signed)
LMP 07/02/18. No period yet this month and having pain in lower abd and breasts tender. Spotted one time 08/01/18 and nothing since.

## 2018-08-10 NOTE — MAU Provider Note (Signed)
History     CSN: [id number]  Arrival date and time: 08/10/18 8119   First Provider Initiated Contact with Patient 08/10/18 0825      Chief Complaint  Patient presents with  . Possible Pregnancy   HPI  Ms.  [name] is a 42 y.o. year old G81P1102 female at [redacted]w[redacted]d weeks gestation who presents to MAU reporting lower abdominal pain, breasts tenderness, and (+) HPT x 2. She reports 1 episode of vaginal spotting on 08/01/18, but none since. 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 She is planning on TTC next month. She and her S.O. Desire to be pregnant. She has cHTN and T2DM; which she reports has been managed since losing weight.  *Per patient's daughter, her mother has had the RLQ pain x 12 yrs with several tests that show no cause for the pain  Past Medical History:  Diagnosis Date  . Carpal tunnel syndrome, bilateral   . Depression   . GERD (gastroesophageal reflux disease)   . H. pylori infection    dx  09-02-2014-- treated w/ antibiotic  . Hypertension    recently low pressures   . Irregular menstrual cycle   . Menorrhagia   . PCOS (polycystic ovarian syndrome)   . Type 2 diabetes mellitus ([place])     Past Surgical History:  Procedure Laterality Date  . CARPAL TUNNEL RELEASE Bilateral 10/07/2014   Procedure: CARPAL TUNNEL RELEASE BILATERAL;  Surgeon: [name], MD;  Location: [facility];  Service: Orthopedics;  Laterality: Bilateral;  . CESAREAN SECTION  1995  . GASTRIC ROUX-EN-Y N/A 03/12/2017   Procedure: LAPAROSCOPIC ROUX-EN-Y GASTRIC BYPASS WITH UPPER ENDOSCOPY;  Surgeon: Kieth Brightly, Arta Bruce, MD;  Location: [facility];  Service: General;  Laterality: N/A;  . UPPER GI ENDOSCOPY  03/12/2017   Procedure: UPPER GI ENDOSCOPY;  Surgeon: Kieth Brightly Arta Bruce, MD;  Location: [facility];  Service: General;;    Family History  Problem Relation Age of Onset  . Heart disease Mother 75       CAD  . Diabetes Mother   .  Hypertension Mother   . Cancer Father 44       prostate cancer  . Diabetes Father   . Thyroid disease Neg Hx     Social History   Tobacco Use  . Smoking status: Never Smoker  . Smokeless tobacco: Never Used  Substance Use Topics  . Alcohol use: Never    Alcohol/week: 0.0 standard drinks    Frequency: Never  . Drug use: No    Allergies:  Allergies  Allergen Reactions  . Food     RAW/FRESH FRUIT--PEACHES, PEARS, APPLES=THROAT CLOSES/SWELLING & COUGHING SHORTNESS OF BREATH  . 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 Shrimp [Shellfish Allergy] Anaphylaxis    Medications Prior to Admission  Medication Sig Dispense Refill Last Dose  . levothyroxine (SYNTHROID, LEVOTHROID) 100 MCG tablet Take 1 tablet (100 mcg total) by mouth daily. 90 tablet 3 Past Month at Unknown time  . amoxicillin-clavulanate (AUGMENTIN) 875-125 MG tablet Take 1 tablet by mouth 2 (two) times daily. 20 tablet 0 Unknown at Unknown time  . desonide (DESOWEN) 0.05 % cream APPLY  CREAM TOPICALLY TWICE DAILY 15 g 0 Taking  . FLUoxetine (PROZAC) 20 MG tablet Take 1 tablet (20 mg total) by mouth daily. 90 tablet 1 Unknown at Unknown time  . fluticasone (FLONASE) 50 MCG/ACT nasal spray Place 2 sprays into both nostrils daily. 16 g 6 Unknown at  Unknown time  . glucose blood (FREESTYLE TEST STRIPS) test strip Please fill with preferred strip and check sugar once daily 100 each 4 Taking  . hydrocortisone (ANUSOL-HC) 25 MG suppository Place 1 suppository (25 mg total) rectally 2 (two) times daily. (Patient not taking: Reported on 02/12/2018) 12 suppository 0 Unknown at Unknown time  . loratadine (CLARITIN) 10 MG tablet Take 1 tablet (10 mg total) by mouth daily. 90 tablet 3 Unknown at Unknown time  . metroNIDAZOLE (METROGEL) 0.75 % vaginal gel Place 1 Applicatorful vaginally 2 (two) times daily. 70 g 0 Unknown at Unknown time  . Multiple Vitamin (MULTIVITAMIN) tablet Take 1 tablet by mouth daily.   Unknown at Unknown time  .  nystatin (NYSTATIN) powder Apply 1 g topically 2 (two) times daily. 56 g 2 Unknown at Unknown time  . nystatin cream (MYCOSTATIN) Apply 1 application topically 2 (two) times daily. 30 g 5 Unknown at Unknown time  . NYSTATIN powder APPLY 1 GRAM TOPICALLY TWO TIMES DAILY 1 Bottle 1 Unknown at Unknown time  . Olopatadine HCl 0.2 % SOLN Apply 1 drop to eye daily. 2.5 mL 2 Unknown at Unknown time  . pantoprazole (PROTONIX) 40 MG tablet Take 1 tablet (40 mg total) by mouth daily. 30 tablet 1 More than a month at Unknown time    Review of Systems  Constitutional: Negative.   HENT: Negative.   Eyes: Negative.   Respiratory: Negative.   Cardiovascular: Negative.  Palpitations: lower abdominal pain; R>L.  Gastrointestinal: Positive for abdominal pain.  Endocrine: Negative.   Genitourinary: Positive for pelvic pain (RT side).  Musculoskeletal: Negative.   Skin: Negative.   Allergic/Immunologic: Negative.   Neurological: Negative.   Hematological: Negative.   Psychiatric/Behavioral: Negative.    Physical Exam   Blood pressure 126/62, pulse (!) 52, temperature 98.6 F (37 C), resp. rate 18, height 5\' 2"  (1.575 m), weight 94.3 kg, last menstrual period 07/02/2018.  Physical Exam  Constitutional: She is oriented to person, place, and time. She appears well-developed and well-nourished.  HENT:  Head: Normocephalic and atraumatic.  Eyes: Pupils are equal, round, and reactive to light.  Neck: Normal range of motion.  Cardiovascular: Normal rate, regular rhythm, normal heart sounds and intact distal pulses.  Respiratory: Effort normal and breath sounds normal.  GI: Soft. Bowel sounds are normal.  Genitourinary:  Genitourinary Comments: Enlarged fatty mons pubis d/t rapid weight loss // Uterus: difficult to palpate d/t panniculous abdomen, SE: cervix is smooth, pink, no lesions, scant amt of thin, malodorous, white vaginal d/c -- WP, GC/CT done, closed/long/firm, no CMT or friability, exquisite RT  and moderate LT adnexal tenderness   Musculoskeletal: Normal range of motion.  Neurological: She is alert and oriented to person, place, and time. She has normal reflexes.  Skin: Skin is warm and dry.  Psychiatric: She has a normal mood and affect. Her behavior is normal. Judgment and thought content normal.    MAU Course  Procedures  MDM CCUA UPT CBC w/Diff ABO/Rh HCG Wet Prep GC/CT -- pending HIV -- pending OB < 14 wks [place] with TV  *Consult with Dr. Rosana Hoes @ 1105 - notified of patient's complaints, assessments, lab & U/S results, recommended tx plan surgery -- pt declined surgery // Dr. Rosana Hoes spoke to Dr. Raphael Gibney and is in agreement to d/c home with clear instructions on ectopic pregnancy, note to be OOW   Results for orders placed or performed during the hospital encounter of 08/10/18 (from the past 24 hour(s))  Urinalysis,  Routine w reflex microscopic     Status: Abnormal   Collection Time: 08/10/18  7:19 AM  Result Value Ref Range   Color, Urine YELLOW YELLOW   APPearance CLEAR CLEAR   Specific Gravity, Urine 1.012 1.005 - 1.030   pH 5.0 5.0 - 8.0   Glucose, UA NEGATIVE NEGATIVE mg/dL   Hgb urine dipstick NEGATIVE NEGATIVE   Bilirubin Urine NEGATIVE NEGATIVE   Ketones, ur NEGATIVE NEGATIVE mg/dL   Protein, ur NEGATIVE NEGATIVE mg/dL   Nitrite NEGATIVE NEGATIVE   Leukocytes, UA TRACE (A) NEGATIVE   RBC / HPF 0-5 0 - 5 RBC/hpf   WBC, UA 0-5 0 - 5 WBC/hpf   Bacteria, UA NONE SEEN NONE SEEN   Squamous Epithelial / LPF 0-5 0 - 5   Mucus PRESENT   Pregnancy, urine POC     Status: Abnormal   Collection Time: 08/10/18  7:20 AM  Result Value Ref Range   Preg Test, Ur POSITIVE (A) NEGATIVE  Wet prep, genital     Status: Abnormal   Collection Time: 08/10/18  8:36 AM  Result Value Ref Range   Yeast Wet Prep HPF POC NONE SEEN NONE SEEN   Trich, Wet Prep NONE SEEN NONE SEEN   Clue Cells Wet Prep HPF POC NONE SEEN NONE SEEN   WBC, Wet Prep HPF POC FEW (A) NONE SEEN   Sperm  NONE SEEN   CBC with Differential/Platelet     Status: None   Collection Time: 08/10/18  8:52 AM  Result Value Ref Range   WBC 6.9 4.0 - 10.5 K/uL   RBC 4.46 3.87 - 5.11 MIL/uL   Hemoglobin 12.9 12.0 - 15.0 g/dL   HCT 39.1 36.0 - 46.0 %   MCV 87.7 80.0 - 100.0 fL   MCH 28.9 26.0 - 34.0 pg   MCHC 33.0 30.0 - 36.0 g/dL   RDW 14.1 11.5 - 15.5 %   Platelets 364 150 - 400 K/uL   nRBC 0.0 0.0 - 0.2 %   Neutrophils Relative % 53 %   Neutro Abs 3.7 1.7 - 7.7 K/uL   Lymphocytes Relative 37 %   Lymphs Abs 2.5 0.7 - 4.0 K/uL   Monocytes Relative 7 %   Monocytes Absolute 0.5 0.1 - 1.0 K/uL   Eosinophils Relative 2 %   Eosinophils Absolute 0.2 0.0 - 0.5 K/uL   Basophils Relative 1 %   Basophils Absolute 0.1 0.0 - 0.1 K/uL  Comprehensive metabolic panel     Status: Abnormal   Collection Time: 08/10/18  8:52 AM  Result Value Ref Range   Sodium 135 135 - 145 mmol/L   Potassium 3.5 3.5 - 5.1 mmol/L   Chloride 104 98 - 111 mmol/L   CO2 26 22 - 32 mmol/L   Glucose, Bld 87 70 - 99 mg/dL   BUN 16 6 - 20 mg/dL   Creatinine, Ser 0.70 0.44 - 1.00 mg/dL   Calcium 8.4 (L) 8.9 - 10.3 mg/dL   Total Protein 7.3 6.5 - 8.1 g/dL   Albumin 3.9 3.5 - 5.0 g/dL   AST 19 15 - 41 U/L   ALT 16 0 - 44 U/L   Alkaline Phosphatase 64 38 - 126 U/L   Total Bilirubin 0.7 0.3 - 1.2 mg/dL   GFR calc non Af Amer >60 >60 mL/min   GFR calc Af Amer >60 >60 mL/min   Anion gap 5 5 - 15  ABO/Rh     Status: None (Preliminary result)  Collection Time: 08/10/18  8:52 AM  Result Value Ref Range   ABO/RH(D)      O POS Performed at [facility], [place], [place] [address]   hCG, quantitative, pregnancy     Status: Abnormal   Collection Time: 08/10/18  8:52 AM  Result Value Ref Range   hCG, Beta Chain, Quant, S 7,436 (H) <5 mIU/mL    US Ob Less Than 14 Weeks With Ob Transvaginal  Result Date: 08/10/2018 CLINICAL DATA:  Abdominal pain during pregnancy EXAM: OBSTETRIC <14 WK [place] AND TRANSVAGINAL OB  US TECHNIQUE: Both transabdominal and transvaginal ultrasound examinations were performed for complete evaluation of the gestation as well as the maternal uterus, adnexal regions, and pelvic cul-de-sac. Transvaginal technique was performed to assess early pregnancy. COMPARISON:  None. FINDINGS: Intrauterine gestational sac: There is a intrauterine fluid collection with a mean diameter of 9 mm. Yolk sac:  Not visualized Embryo:  Not visualized MSD: 9 mm   5 w   4 d Subchorionic hemorrhage:  None visualized. Maternal uterus/adnexae: Subchorionic hemorrhage: None Right ovary: Normal Left ovary: Closely associated to the left ovary is a cystic structure with a soft tissue rind measuring 2.6 x 2.4 x 2.8 cm. Other :Fibroid is noted measuring 3.1 cm. Free fluid: Small to moderate free fluid identified within the pelvis. IMPRESSION: 1. Small intrauterine fluid collection is identified with a mean diameter of 9 mm. There is no yolk sac or embryo identified. Within the left adnexa there is a solid and cystic structure which is difficult to separate from the left ovary. This may represent a corpus luteum, but an ectopic pregnancy cannot be excluded. Short-term interval follow-up with repeat pelvic sonogram and serial quantitative beta HCG is advised. Electronically Signed   By: Kerby Moors M.D.   On: 08/10/2018 10:12     Assessment and Plan  Pregnancy of unknown anatomic location  - Dr. Rosana Hoes offered patient surgery - pt declined, Dr. Raphael Gibney informed; see Dr. Rosana Hoes' documentation - Information provided on ectopic pregnancy  - Advised of signs to return to MAU (increased pain, VB, or LOC) - Discharge home - F/U with Dr. Phineas Real ASAP - Patient verbalized an understanding of the plan of care and agrees.    Laury Deep, MSN, CNM 08/10/2018, 8:29 AM

## 2018-08-10 NOTE — Progress Notes (Signed)
Faculty Note  In to evaluate patient for ultrasound concerning for ectopic pregnancy.   Patient reports she presents to MAU for 3 days of intermittent left lower quadrant pain, states can be up to 5/10 but she generally does not feel bad and it comes and goes very quickly. She suspects she is pregnant as she took a home pregnancy test after noting she missed her period and her breasts were very tender which was positive. She states she is mostly here today to check on the pregnancy. She denies other complaints and is feeling well.   BP (!) 153/91   Pulse 61   Temp 98.6 F (37 C)   Resp 18   Ht 5\' 2"  (1.575 m)   Wt 208 lb (94.3 kg)   LMP 07/02/2018   BMI 38.04 kg/m   Gen: alert, oriented, obese Abd: well healed mid-line vertical incision, significant skin folds (h/o gastric bypass with 100 lb weight loss), no tenderness in LLQ, moderate tenderness in RLQ   HCG: 7436  TVUS 08/10/18 CLINICAL DATA:  Abdominal pain during pregnancy  EXAM: OBSTETRIC <14 WK [place] AND TRANSVAGINAL OB US  TECHNIQUE: Both transabdominal and transvaginal ultrasound examinations were performed for complete evaluation of the gestation as well as the maternal uterus, adnexal regions, and pelvic cul-de-sac. Transvaginal technique was performed to assess early pregnancy.  COMPARISON:  None.  FINDINGS: Intrauterine gestational sac: There is a intrauterine fluid collection with a mean diameter of 9 mm.  Yolk sac:  Not visualized  Embryo:  Not visualized  MSD: 9 mm   5 w   4 d  Subchorionic hemorrhage:  None visualized.  Maternal uterus/adnexae:  Subchorionic hemorrhage: None  Right ovary: Normal  Left ovary: Closely associated to the left ovary is a cystic structure with a soft tissue rind measuring 2.6 x 2.4 x 2.8 cm.  Other :Fibroid is noted measuring 3.1 cm.  Free fluid: Small to moderate free fluid identified within the pelvis.  IMPRESSION: 1. Small intrauterine fluid  collection is identified with a mean diameter of 9 mm. There is no yolk sac or embryo identified. Within the left adnexa there is a solid and cystic structure which is difficult to separate from the left ovary. This may represent a corpus luteum, but an ectopic pregnancy cannot be excluded. Short-term interval follow-up with repeat pelvic sonogram and serial quantitative beta HCG is advised.   Electronically Signed   By: Kerby Moors M.D.   On: 08/10/2018 10:12   A/P: 42 yo G2P1001 with early IUP versus ectopic pregnancy. Reviewed that imaging is highly suspicious for ectopic pregnancy in left adnexa, however with no visualization of embryo/yolk sac, we cannot definitively rule out early IUP. Reviewed that if methotrexate is given for high suspicion of ectopic, this may or may not work based on her HCG level, requiring repeat dosing or subsequent surgery, but that it would certainly interrupt a normal pregnancy. Reviewed that with diagnostic laparoscopy, if no abnormalities seen, would not remove any structures but if abnormalities noted, would plan for removal of ectopic which would likely include fallopian tube. Reviewed that waiting 48 hrs and repeating US and HCG would potentially give [place] more information about whether this is an ectopic pregnancy vs an early IUP, but that there is significant risk of rupture of ectopic, which is potentially life-threatening to patient. Reviewed that if she desires to wait, if ectopic is noted in 48 hrs, she will most likely need surgery for removal.   On exam, patient  is moderately tender in her RLQ, however patient and her daughter state she has a long-standing history of RLQ pain and that she has been tender on exam like that for 12 years and this is no different than her usual pain. Given that she presented for left lower quadrant pain that is intermittent, as well as tenderness on exam in RLQ, I recommend proceeding with a diagnostic laparoscopy. Patient  and her daughter state this is a very desired pregnancy and she would like to wait 48 hrs and repeat US and HCG. I reviewed the possibility of rupture of ectopic pregnancy and it's potential to be life-threatening, patient and her daughter verbalize understanding and state they will not do anything to potentially harm the pregnancy and would prefer to wait and repeat US/blood work.   I discussed the above with Dr. Raphael Gibney, covering for Dr. Phineas Real. Patient is to call Dr. Zelphia Cairo office in am to schedule appointment for repeat US and blood work. Patient verbalizes understanding of this plan, is agreeable to this plan and understands she should return to hospital with any worsening of her pain or other issues. Answered all questions.    Feliz Beam, M.D. [facility]

## 2018-08-10 NOTE — MAU Note (Signed)
Do not cycle bp per NP

## 2018-08-11 ENCOUNTER — Encounter: Payer: Self-pay | Admitting: *Deleted

## 2018-08-11 ENCOUNTER — Telehealth: Payer: Self-pay | Admitting: *Deleted

## 2018-08-11 DIAGNOSIS — Z3490 Encounter for supervision of normal pregnancy, unspecified, unspecified trimester: Secondary | ICD-10-CM

## 2018-08-11 LAB — GC/CHLAMYDIA PROBE AMP (~~LOC~~) NOT AT ARMC
Chlamydia: NEGATIVE
Neisseria Gonorrhea: NEGATIVE

## 2018-08-11 NOTE — Telephone Encounter (Signed)
Dr. Theodis Aguas called wanting you to know patient was seen at ER yesterday currently pregnant and c/o pain. Notes in epic.

## 2018-08-11 NOTE — Telephone Encounter (Signed)
Left detailed message on cell phone voicemail also made lab appointment for Oct 15.

## 2018-08-11 NOTE — Telephone Encounter (Signed)
Order placed, will route to [facility] relay and schedule lab appointment

## 2018-08-11 NOTE — Progress Notes (Signed)
Pt walked in the office with questions of needing a quant vs an [place] for follow up to her ER visit yesterday. We had called her after receiving a call from Dr Raphael Gibney to follow up with patient from yesterday. Dr Phineas Real received the records and had advised HCG quat for Tuesday am. She was advised and scheduled for 08/12/18.   I reviewed her records including [place] and [place]. Based on LMP 5w 4 d.  The [place] was not indicative of an ectopic nor an IUP.  The patient states had very mild pain on RLQ and it wasn't bad at all per pt today.  She states has no pain on LLQ.  Some intermittent lower back pain but lifts at work.  I reviewed all symptoms of ectopic including but not limited to low ab pain persistent not getting better and increasingly getting worse at a fast rate to possibly include weakness or dizziness, if she has these to present to [facility] immediately.  She verbally understood this.  I advised what the HCG could tell us and we would advise next step after receiving the HCG results.   Pt will present to office tomorrow for Quant and follow up accordingly. KW CMA

## 2018-08-11 NOTE — Telephone Encounter (Signed)
Patient should have follow-up beta-hCG drawn tomorrow.

## 2018-08-12 ENCOUNTER — Other Ambulatory Visit: Payer: BLUE CROSS/BLUE SHIELD

## 2018-08-12 DIAGNOSIS — Z3490 Encounter for supervision of normal pregnancy, unspecified, unspecified trimester: Secondary | ICD-10-CM | POA: Diagnosis not present

## 2018-08-12 LAB — HIV ANTIBODY (ROUTINE TESTING W REFLEX): HIV SCREEN 4TH GENERATION: NONREACTIVE

## 2018-08-13 ENCOUNTER — Encounter: Payer: Self-pay | Admitting: Gynecology

## 2018-08-13 ENCOUNTER — Other Ambulatory Visit: Payer: Self-pay | Admitting: *Deleted

## 2018-08-13 ENCOUNTER — Ambulatory Visit (INDEPENDENT_AMBULATORY_CARE_PROVIDER_SITE_OTHER): Payer: BLUE CROSS/BLUE SHIELD

## 2018-08-13 ENCOUNTER — Ambulatory Visit: Payer: BLUE CROSS/BLUE SHIELD | Admitting: Gynecology

## 2018-08-13 ENCOUNTER — Other Ambulatory Visit: Payer: Self-pay | Admitting: Gynecology

## 2018-08-13 VITALS — BP 134/80

## 2018-08-13 DIAGNOSIS — O209 Hemorrhage in early pregnancy, unspecified: Secondary | ICD-10-CM

## 2018-08-13 DIAGNOSIS — Z3490 Encounter for supervision of normal pregnancy, unspecified, unspecified trimester: Secondary | ICD-10-CM

## 2018-08-13 DIAGNOSIS — O200 Threatened abortion: Secondary | ICD-10-CM | POA: Diagnosis not present

## 2018-08-13 DIAGNOSIS — D251 Intramural leiomyoma of uterus: Secondary | ICD-10-CM

## 2018-08-13 DIAGNOSIS — Z3491 Encounter for supervision of normal pregnancy, unspecified, first trimester: Secondary | ICD-10-CM

## 2018-08-13 DIAGNOSIS — N831 Corpus luteum cyst of ovary, unspecified side: Secondary | ICD-10-CM

## 2018-08-13 LAB — HCG, QUANTITATIVE, PREGNANCY: HCG, TOTAL, QN: 10418 m[IU]/mL

## 2018-08-13 MED ORDER — NYSTATIN 100000 UNIT/GM EX POWD: 1.0000 g | Freq: Two times a day (BID) | CUTANEOUS | 2 refills | Status: DC

## 2018-08-13 MED ORDER — LEVOTHYROXINE SODIUM 100 MCG PO TABS: 100.0000 ug | ORAL_TABLET | Freq: Every day | ORAL | 0 refills | Status: DC

## 2018-08-13 NOTE — Patient Instructions (Signed)
Follow up for ultrasound in one week

## 2018-08-13 NOTE — Progress Notes (Signed)
    Ramina Hulet Coreas [birth year] [id number]        42 y.o.  [id number] presents having been seen in the emergency room 08/10/2018 to verify a positive home pregnancy test.  She also is having some left sided intermittent pain over the preceding several days.  She was having no bleeding.  Ultrasound showed a gestational sac measuring 9 mm consistent with 5 weeks 4 days.  No yolk sac or embryonic pole was noted.  Also had a cystic area in the left adnexa that they had difficulty separating it from the ovary stating a an ectopic pregnancy cannot be excluded.  The patient's hemoglobin was 12.9 and a beta-hCG was 7436.  Blood type O+.  Patient was offered laparoscopy rule out ectopic pregnancy but declined because this was a highly valued pregnancy and she preferred no intervention at that time as she was clinically stable.  She had a repeat hCG yesterday roughly 2 days from the prior 7436 which returned 10,418.  She notes that her pain has resolved and she has done no bleeding and she is back to work.  Past medical history,surgical history, problem list, medications, allergies, family history and social history were all reviewed and documented in the EPIC chart.  Directed ROS with pertinent positives and negatives documented in the history of present illness/assessment and plan.  Exam: [place] assistant Vitals:   08/13/18 1151  BP: 134/80   General appearance:  Normal Abdomen soft nontender without masses guarding rebound Pelvic external BUS vagina normal.  Cervix normal.  Uterus grossly normal size midline mobile nontender.  Adnexa without masses or tenderness.  Ultrasound transvaginal shows gestational sac 15.2 mm consistent with 6 weeks 2 days.  Yolk sac measuring 3.2 mm.  Right and left ovaries normal.  Left ovary with classic appearing corpus luteal cyst 29 x 23 mm.  Cervix long and closed.  No clear fetal cardiac activity seen.  Assessment/Plan:  42 y.o. [id number] with history initially highly  suspicious for ectopic pregnancy.  Rise in hCG not doubling in 48 hours but with a rise.  Yolk sac classic in appearance visualized.  Questionable left ectopic pregnancy on previous ultrasound appears as a classic corpus luteal cyst.  Reviewed with patient that at this point everything looks like a normal developing pregnancy although her beta-hCG has not doubled and we yet see cardiac activity.  Possibilities for SAB reviewed.  Options for continued serial hCG measurements versus follow-up ultrasound 1 week reviewed.  Patient is comfortable with follow-up ultrasound in 1 week.  ASAP call precautions discussed for increasing pain, bleeding or any other concerns.    Anastasio Auerbach MD, 12:37 PM 08/13/2018

## 2018-08-14 ENCOUNTER — Telehealth: Payer: Self-pay | Admitting: *Deleted

## 2018-08-14 ENCOUNTER — Encounter: Payer: Self-pay | Admitting: *Deleted

## 2018-08-14 NOTE — Telephone Encounter (Signed)
Patient was seen on 08/13/18 called c/o spotting amount of red blood when wiping this am and once this afternoon, no pain now, notes have lower back pain late last night but went away and no pain since then. She is not having urinary symptoms, patient said she has history of blood when wiping. Patient said she is not having any vaginal bleeding. Please advise

## 2018-08-14 NOTE — Telephone Encounter (Signed)
Monitor for now.  Avoid heavy lifting, straining or prolonged standing.  No intercourse or anything in the vagina.

## 2018-08-14 NOTE — Telephone Encounter (Signed)
Letter printed and left at appointment desk for pick up. 

## 2018-08-14 NOTE — Telephone Encounter (Signed)
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 If you would tell me what you want the letter to say I will type it up.

## 2018-08-14 NOTE — Telephone Encounter (Signed)
Letter for light duty okay.  Not to exceed 8 hours daily for the next 2 weeks

## 2018-08-15 ENCOUNTER — Encounter: Payer: Self-pay | Admitting: Gynecology

## 2018-08-21 ENCOUNTER — Other Ambulatory Visit: Payer: BLUE CROSS/BLUE SHIELD

## 2018-08-21 ENCOUNTER — Ambulatory Visit: Payer: BLUE CROSS/BLUE SHIELD | Admitting: Gynecology

## 2018-08-21 ENCOUNTER — Ambulatory Visit (INDEPENDENT_AMBULATORY_CARE_PROVIDER_SITE_OTHER): Payer: BLUE CROSS/BLUE SHIELD

## 2018-08-21 ENCOUNTER — Encounter: Payer: Self-pay | Admitting: Gynecology

## 2018-08-21 VITALS — BP 120/76

## 2018-08-21 DIAGNOSIS — O200 Threatened abortion: Secondary | ICD-10-CM

## 2018-08-21 NOTE — Patient Instructions (Signed)
Make your new OB appointment as we discussed.

## 2018-08-21 NOTE — Progress Notes (Signed)
    [name] Nov 10, 1975 [id number]        42 y.o.  [id number] presents for follow-up ultrasound for viability.  She continues to spot on and off.  No significant cramping or pain.  Past medical history,surgical history, problem list, medications, allergies, family history and social history were all reviewed and documented in the EPIC chart.  Directed ROS with pertinent positives and negatives documented in the history of present illness/assessment and plan.  Exam: Vitals:   08/21/18 0830  BP: 120/76   General appearance:  Normal  Ultrasound transvaginal shows single IUP crown rump length 4.6 mm with cardiac activity at 123 bpm yolk sac normal in shape.  Right ovary normal.  Left ovary with corpus luteal cyst.  Cervix long and closed.  No evidence of hematoma.  Assessment/Plan:  42 y.o. [id number] with viable IUP at 6 weeks 0 days.  We reviewed early pregnancy precautions and avoidance.  She is on a prenatal vitamin every day.  History of gastric bypass.  Needs to establish obstetrical care early and I recommended she call and schedule a new OB appointment now with an obstetrician in town.  Copies of her ultrasound report provided to her to take to her new OB appointment.    Anastasio Auerbach MD, 8:51 AM 08/21/2018

## 2018-08-28 ENCOUNTER — Ambulatory Visit: Payer: Self-pay

## 2018-08-28 ENCOUNTER — Encounter: Payer: Self-pay | Admitting: *Deleted

## 2018-08-28 NOTE — Telephone Encounter (Signed)
Patient called in with c/o "feeling anxious." She says "for the past 2 weeks off and on, I have felt the need to cry, my hands/legs shaking, sweating really bad. I am pregnant and I called the OBGYN and was told to call my doctor. I was seeing Dr. Reginia Forts, but she's not there anymore. I don't want to go to the ER because I'm not in any pain. I don't know what is going on. I am up every hour using the bathroom, so not sleeping very well. Some days I feel ok, like this week I was fine, but today it happened again. I am at work and they were going to call EMS, but I told them I would call the office first." I asked about her functioning, she says "I am having trouble doing things I used to do." I asked about thoughts of suicide/self harm to herself or others, she denies thoughts. I asked about having these feelings prior to pregnancy, she says "I've never had these feelings before." I asked about caffeine, she says "I only drink water and powerade zero." I asked about stress, she says "I don't feel like I'm stressed. Pregnancy is the only recent change in my life right now." I asked about other symptoms, chest pain, palpitations, difficulty breathing, fever, she denies all except shaking hands/feet and sweating. According to protocol, see PCP within 24 hours, appointment scheduled for tomorrow at 0800 with Tenna Delaine, PA-C, care advice given, patient verbalized understanding. I asked the patient if she would like to establish care with a provider here at [facility], she says "I want to stay with Dr. Tamala Julian at her new office. I already have an appointment there next month." I asked did she call that office with her symptoms, she says "no, I lost the number." I looked in the chart and found the letter sent out by Dr. Tamala Julian and gave the patient the information-phone number and address. She says "I will call them and if I can get in there today or tomorrow, I will call back and cancel the appointment."    Reason  for Disposition . Symptoms interfere with work or school  Answer Assessment - Initial Assessment Questions 1. CONCERN: "What happened that made you call today?"     Feel like I want to cry, depressed, shaking, sweating 2. ANXIETY SYMPTOM SCREENING: "Can you describe how you have been feeling?"  (e.g., tense, restless, panicky, anxious, keyed up, trouble sleeping, trouble concentrating)     Feel like I want to cry, shaking, sweating 3. ONSET: "How long have you been feeling this way?"     2 weeks; some days I feel ok 4. RECURRENT: "Have you felt this way before?"  If yes: "What happened that time?" "What helped these feelings go away in the past?"      No, first time 5. RISK OF HARM - SUICIDAL IDEATION:  "Do you ever have thoughts of hurting or killing yourself?"  (e.g., yes, no, no but preoccupation with thoughts about death)   - INTENT:  "Do you have thoughts of hurting or killing yourself right NOW?" (e.g., yes, no, N/A) No   - PLAN: "Do you have a specific plan for how you would do this?" (e.g., gun, knife, overdose, no plan, N/A)     N/A 6. RISK OF HARM - HOMICIDAL IDEATION:  "Do you ever have thoughts of hurting or killing someone else?"  (e.g., yes, no, no but preoccupation with thoughts about death)   - INTENT:  "  Do you have thoughts of hurting or killing someone right NOW?" (e.g., yes, no, N/A) No   - PLAN: "Do you have a specific plan for how you would do this?" (e.g., gun, knife, no plan, N/A)      N/A 7. FUNCTIONAL IMPAIRMENT: "How have things been going for you overall in your life? Have you had any more difficulties than usual doing your normal daily activities?"  (e.g., better, same, worse; self-care, school, work, interactions)     Pregnant 7-8 weeks; Yes, having trouble doing things I used to do 8. SUPPORT: "Who is with you now?" "Who do you live with?" "Do you have family or friends nearby who you can talk to?"      Yes 9. THERAPIST: "Do you have a counselor or therapist?  Name?"     No 10. STRESSORS: "Has there been any new stress or recent changes in your life?"       Pregnancy 11. CAFFEINE ABUSE: "Do you drink caffeinated beverages, and how much each day?" (e.g., coffee, tea, colas)       No, just powerade Zero and water 12. SUBSTANCE ABUSE: "Do you use any illegal drugs or alcohol?"       No 13. OTHER SYMPTOMS: "Do you have any other physical symptoms right now?" (e.g., chest pain, palpitations, difficulty breathing, fever)       Sweating, hands/legs shaking 14. PREGNANCY: "Is there any chance you are pregnant?" "When was your last menstrual period?"       Pregnant now  Protocols used: ANXIETY AND PANIC ATTACK-A-AH

## 2018-08-29 ENCOUNTER — Ambulatory Visit: Payer: Self-pay | Admitting: Physician Assistant

## 2018-08-30 ENCOUNTER — Ambulatory Visit: Payer: Self-pay | Admitting: Family Medicine

## 2018-09-02 ENCOUNTER — Telehealth: Payer: Self-pay | Admitting: *Deleted

## 2018-09-02 DIAGNOSIS — E119 Type 2 diabetes mellitus without complications: Secondary | ICD-10-CM | POA: Diagnosis not present

## 2018-09-02 DIAGNOSIS — Z23 Encounter for immunization: Secondary | ICD-10-CM | POA: Diagnosis not present

## 2018-09-02 DIAGNOSIS — R310 Gross hematuria: Secondary | ICD-10-CM | POA: Diagnosis not present

## 2018-09-02 DIAGNOSIS — E890 Postprocedural hypothyroidism: Secondary | ICD-10-CM | POA: Diagnosis not present

## 2018-09-02 DIAGNOSIS — J301 Allergic rhinitis due to pollen: Secondary | ICD-10-CM | POA: Diagnosis not present

## 2018-09-02 NOTE — Telephone Encounter (Signed)
Estill Bamberg called from the HR Dept for [employer] for [employer] as a letter was given by Dr Phineas Real for lifting and standing limitations. I verified with Dr Phineas Real that the patient should not lift more than 20lbs in which straining would occur if lifts over 20 lbs.  The patient is also limited to standing to 2 hours then a 10 min break.  Estill Bamberg understood limitations and update the patients file.  I advised this goes until 10/05/18 as then the patient would be [redacted] weeks pregnant and any further requests should go to her obstetrician. KW CMA/Dr Fontaine

## 2018-09-04 DIAGNOSIS — Z3480 Encounter for supervision of other normal pregnancy, unspecified trimester: Secondary | ICD-10-CM | POA: Diagnosis not present

## 2018-09-04 DIAGNOSIS — Z369 Encounter for antenatal screening, unspecified: Secondary | ICD-10-CM | POA: Diagnosis not present

## 2018-09-10 DIAGNOSIS — O3680X9 Pregnancy with inconclusive fetal viability, other fetus: Secondary | ICD-10-CM | POA: Diagnosis not present

## 2018-09-23 DIAGNOSIS — O3680X9 Pregnancy with inconclusive fetal viability, other fetus: Secondary | ICD-10-CM | POA: Diagnosis not present

## 2018-09-23 DIAGNOSIS — Z3480 Encounter for supervision of other normal pregnancy, unspecified trimester: Secondary | ICD-10-CM | POA: Diagnosis not present

## 2018-09-23 DIAGNOSIS — E039 Hypothyroidism, unspecified: Secondary | ICD-10-CM | POA: Diagnosis not present

## 2018-09-23 DIAGNOSIS — R8271 Bacteriuria: Secondary | ICD-10-CM | POA: Diagnosis not present

## 2018-10-10 DIAGNOSIS — O3680X9 Pregnancy with inconclusive fetal viability, other fetus: Secondary | ICD-10-CM | POA: Diagnosis not present

## 2018-10-13 DIAGNOSIS — Z3480 Encounter for supervision of other normal pregnancy, unspecified trimester: Secondary | ICD-10-CM | POA: Diagnosis not present

## 2018-10-13 DIAGNOSIS — I10 Essential (primary) hypertension: Secondary | ICD-10-CM | POA: Diagnosis not present

## 2018-10-14 DIAGNOSIS — Z3A13 13 weeks gestation of pregnancy: Secondary | ICD-10-CM | POA: Diagnosis not present

## 2018-10-14 DIAGNOSIS — O99212 Obesity complicating pregnancy, second trimester: Secondary | ICD-10-CM | POA: Diagnosis not present

## 2018-10-14 DIAGNOSIS — O358XX0 Maternal care for other (suspected) fetal abnormality and damage, not applicable or unspecified: Secondary | ICD-10-CM | POA: Diagnosis not present

## 2018-10-14 DIAGNOSIS — O09522 Supervision of elderly multigravida, second trimester: Secondary | ICD-10-CM | POA: Diagnosis not present

## 2018-10-14 DIAGNOSIS — O09521 Supervision of elderly multigravida, first trimester: Secondary | ICD-10-CM | POA: Diagnosis not present

## 2018-10-14 DIAGNOSIS — O09212 Supervision of pregnancy with history of pre-term labor, second trimester: Secondary | ICD-10-CM | POA: Diagnosis not present

## 2018-10-15 DIAGNOSIS — O2690 Pregnancy related conditions, unspecified, unspecified trimester: Secondary | ICD-10-CM | POA: Diagnosis not present

## 2018-10-15 DIAGNOSIS — R69 Illness, unspecified: Secondary | ICD-10-CM | POA: Diagnosis not present

## 2018-10-15 DIAGNOSIS — Z9884 Bariatric surgery status: Secondary | ICD-10-CM | POA: Diagnosis not present

## 2018-10-15 DIAGNOSIS — E669 Obesity, unspecified: Secondary | ICD-10-CM | POA: Diagnosis not present

## 2018-10-15 DIAGNOSIS — K912 Postsurgical malabsorption, not elsewhere classified: Secondary | ICD-10-CM | POA: Diagnosis not present

## 2018-10-16 DIAGNOSIS — Z369 Encounter for antenatal screening, unspecified: Secondary | ICD-10-CM | POA: Diagnosis not present

## 2018-10-24 DIAGNOSIS — I10 Essential (primary) hypertension: Secondary | ICD-10-CM | POA: Diagnosis not present

## 2018-10-30 DIAGNOSIS — Z369 Encounter for antenatal screening, unspecified: Secondary | ICD-10-CM | POA: Diagnosis not present

## 2018-10-30 DIAGNOSIS — E039 Hypothyroidism, unspecified: Secondary | ICD-10-CM | POA: Diagnosis not present

## 2018-10-30 DIAGNOSIS — Z3480 Encounter for supervision of other normal pregnancy, unspecified trimester: Secondary | ICD-10-CM | POA: Diagnosis not present

## 2018-11-07 DIAGNOSIS — Z369 Encounter for antenatal screening, unspecified: Secondary | ICD-10-CM | POA: Diagnosis not present

## 2018-11-12 DIAGNOSIS — O09522 Supervision of elderly multigravida, second trimester: Secondary | ICD-10-CM | POA: Diagnosis not present

## 2018-11-12 DIAGNOSIS — Z36 Encounter for antenatal screening for chromosomal anomalies: Secondary | ICD-10-CM | POA: Diagnosis not present

## 2018-11-12 DIAGNOSIS — Z3A19 19 weeks gestation of pregnancy: Secondary | ICD-10-CM | POA: Diagnosis not present

## 2018-11-12 DIAGNOSIS — Z3A17 17 weeks gestation of pregnancy: Secondary | ICD-10-CM | POA: Diagnosis not present

## 2018-11-12 DIAGNOSIS — O289 Unspecified abnormal findings on antenatal screening of mother: Secondary | ICD-10-CM | POA: Diagnosis not present

## 2018-11-12 DIAGNOSIS — Z363 Encounter for antenatal screening for malformations: Secondary | ICD-10-CM | POA: Diagnosis not present

## 2018-11-12 DIAGNOSIS — O358XX0 Maternal care for other (suspected) fetal abnormality and damage, not applicable or unspecified: Secondary | ICD-10-CM | POA: Diagnosis not present

## 2018-11-26 DIAGNOSIS — Z369 Encounter for antenatal screening, unspecified: Secondary | ICD-10-CM | POA: Diagnosis not present

## 2018-11-26 DIAGNOSIS — E039 Hypothyroidism, unspecified: Secondary | ICD-10-CM | POA: Diagnosis not present

## 2018-11-28 DIAGNOSIS — O358XX0 Maternal care for other (suspected) fetal abnormality and damage, not applicable or unspecified: Secondary | ICD-10-CM | POA: Diagnosis not present

## 2018-11-28 DIAGNOSIS — Z3A20 20 weeks gestation of pregnancy: Secondary | ICD-10-CM | POA: Diagnosis not present

## 2018-12-03 ENCOUNTER — Ambulatory Visit: Payer: BLUE CROSS/BLUE SHIELD | Admitting: Endocrinology

## 2018-12-03 ENCOUNTER — Encounter: Payer: Self-pay | Admitting: Endocrinology

## 2018-12-03 ENCOUNTER — Ambulatory Visit (INDEPENDENT_AMBULATORY_CARE_PROVIDER_SITE_OTHER): Payer: BLUE CROSS/BLUE SHIELD | Admitting: Endocrinology

## 2018-12-03 VITALS — BP 112/70 | HR 69 | Ht 62.0 in | Wt 220.4 lb

## 2018-12-03 DIAGNOSIS — E890 Postprocedural hypothyroidism: Secondary | ICD-10-CM

## 2018-12-03 LAB — TSH: TSH: 10.13 u[IU]/mL — AB (ref 0.35–4.50)

## 2018-12-03 LAB — T4, FREE: FREE T4: 0.74 ng/dL (ref 0.60–1.60)

## 2018-12-03 MED ORDER — LEVOTHYROXINE SODIUM 200 MCG PO TABS: 200.0000 ug | ORAL_TABLET | Freq: Every day | ORAL | 2 refills | Status: AC

## 2018-12-03 NOTE — Patient Instructions (Addendum)
Blood tests are requested for you today.  We'll let you know about the results.  Please come back for a follow-up appointment in 1 month.  
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
## 2018-12-13 ENCOUNTER — Inpatient Hospital Stay (HOSPITAL_COMMUNITY)
Admission: AD | Admit: 2018-12-13 | Discharge: 2018-12-13 | Disposition: A | Discharge status: Home / Self Care | Payer: BLUE CROSS/BLUE SHIELD | Attending: Obstetrics | Admitting: Obstetrics

## 2018-12-13 DIAGNOSIS — J029 Acute pharyngitis, unspecified: Secondary | ICD-10-CM | POA: Insufficient documentation

## 2018-12-13 DIAGNOSIS — R05 Cough: Secondary | ICD-10-CM

## 2018-12-13 DIAGNOSIS — Z3A23 23 weeks gestation of pregnancy: Secondary | ICD-10-CM | POA: Insufficient documentation

## 2018-12-13 DIAGNOSIS — R058 Other specified cough: Secondary | ICD-10-CM

## 2018-12-13 DIAGNOSIS — O26892 Other specified pregnancy related conditions, second trimester: Secondary | ICD-10-CM | POA: Insufficient documentation

## 2018-12-13 DIAGNOSIS — O3680X0 Pregnancy with inconclusive fetal viability, not applicable or unspecified: Secondary | ICD-10-CM

## 2018-12-13 LAB — GROUP A STREP BY PCR: Group A Strep by PCR: NOT DETECTED

## 2018-12-13 LAB — INFLUENZA PANEL BY PCR (TYPE A & B)
Influenza A By PCR: NEGATIVE
Influenza B By PCR: NEGATIVE

## 2018-12-13 NOTE — Discharge Instructions (Signed)

## 2018-12-13 NOTE — Progress Notes (Signed)
Darrol Poke CNM talked with pt and gave list of meds pt can take for flu-like symptoms. Will call if tests come back positive. Pharmacy confirmed. Written and verbal d/c instructions given and understanding voiced.

## 2018-12-13 NOTE — MAU Note (Signed)
Cough starting last wk. Stopped few days and now back. Sometimes cough so much due to mucous I throw up white stuff. Sore throat. When I cough makes my stomach hurt but no other pain besides sore throat.  No vag bleeding or leaking.

## 2018-12-13 NOTE — MAU Provider Note (Signed)
[name] is a 43 y.o. [phone] at [redacted]w[redacted]d who presents to MAU with complaints of cough with mucus and sore throat. She reports cough started occurring 3 days ago then went away, husband then became sick on Wednesday and patient started having a sore throat and cough returned with white thin mucus. She denies lower abdominal pain or cramping, vaginal bleeding or discharge.   BP (!) 141/80   Pulse 73   Temp 98 F (36.7 C)   Resp 18   Ht 5\' 2"  (1.575 m)   Wt 100.7 kg   LMP 07/03/2018   SpO2 100%   BMI 40.60 kg/m  CONSTITUTIONAL: Well-developed, well-nourished female in no acute distress.  CARDIOVASCULAR: Normal heart rate noted RESPIRATORY: Effort and breath sounds normal GASTROINTESTINAL:Soft, gravid appropriate for gestational age.  No tenderness, rebound or guarding.  SKIN: Skin is warm and dry. No rash noted. Not diaphoretic. No erythema. No pallor. PSYCHIATRIC: Normal mood and affect. Normal behavior. Normal judgment and thought content.  FHR 140 by doppler   MDM Medical screening exam complete Influenza and Strep swab obtained, results pending  Educated on safe medications during pregnancy for cold/congestion symptoms, list given  Discussed reasons to return to MAU   A:  1. Acute sore throat   2. Cough productive of clear sputum   3. [redacted] weeks gestation of pregnancy    P: Discharge home Results for Influenza and Strep pending, will call patient with results and manage accordingly  Follow up as scheduled for prenatal appointments Safe medications during pregnancy    Lajean Manes, CNM 12/13/2018 5:29 AM

## 2018-12-13 NOTE — MAU Note (Signed)
Darrol Poke CNM in Triage to see pt.

## 2019-01-01 ENCOUNTER — Ambulatory Visit: Payer: Medicaid Other | Admitting: Endocrinology

## 2019-01-01 VITALS — BP 102/68 | HR 72

## 2019-01-01 DIAGNOSIS — E890 Postprocedural hypothyroidism: Secondary | ICD-10-CM

## 2019-01-01 LAB — TSH: TSH: 1.53 u[IU]/mL (ref 0.35–4.50)

## 2019-01-01 LAB — T4, FREE: Free T4: 0.91 ng/dL (ref 0.60–1.60)
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
## 2019-01-01 NOTE — Patient Instructions (Addendum)
Blood tests are requested for you today.  We'll let you know about the results.  Please come back for a follow-up appointment in 6 weeks.    Hoy se solicitan anlisis de sangre para usted. Le informaremos [employer]. Regrese para una cita de seguimiento en 6 semanas.

## 2019-01-19 ENCOUNTER — Encounter (HOSPITAL_COMMUNITY): Payer: Self-pay

## 2019-01-19 ENCOUNTER — Inpatient Hospital Stay (HOSPITAL_COMMUNITY)
Admission: AD | Admit: 2019-01-19 | Discharge: 2019-01-19 | Disposition: A | Discharge status: Home / Self Care | Payer: Medicaid Other | Attending: Obstetrics and Gynecology | Admitting: Obstetrics and Gynecology

## 2019-01-19 ENCOUNTER — Other Ambulatory Visit: Payer: Self-pay

## 2019-01-19 DIAGNOSIS — O99613 Diseases of the digestive system complicating pregnancy, third trimester: Secondary | ICD-10-CM | POA: Diagnosis not present

## 2019-01-19 DIAGNOSIS — E119 Type 2 diabetes mellitus without complications: Secondary | ICD-10-CM | POA: Diagnosis not present

## 2019-01-19 DIAGNOSIS — R109 Unspecified abdominal pain: Secondary | ICD-10-CM | POA: Diagnosis not present

## 2019-01-19 DIAGNOSIS — O9989 Other specified diseases and conditions complicating pregnancy, childbirth and the puerperium: Secondary | ICD-10-CM | POA: Insufficient documentation

## 2019-01-19 DIAGNOSIS — Z3A28 28 weeks gestation of pregnancy: Secondary | ICD-10-CM | POA: Insufficient documentation

## 2019-01-19 DIAGNOSIS — Z91013 Allergy to seafood: Secondary | ICD-10-CM | POA: Diagnosis not present

## 2019-01-19 DIAGNOSIS — O26893 Other specified pregnancy related conditions, third trimester: Secondary | ICD-10-CM

## 2019-01-19 DIAGNOSIS — O10913 Unspecified pre-existing hypertension complicating pregnancy, third trimester: Secondary | ICD-10-CM | POA: Diagnosis present

## 2019-01-19 DIAGNOSIS — O24113 Pre-existing diabetes mellitus, type 2, in pregnancy, third trimester: Secondary | ICD-10-CM | POA: Insufficient documentation

## 2019-01-19 DIAGNOSIS — Z91018 Allergy to other foods: Secondary | ICD-10-CM | POA: Diagnosis not present

## 2019-01-19 DIAGNOSIS — F329 Major depressive disorder, single episode, unspecified: Secondary | ICD-10-CM | POA: Insufficient documentation

## 2019-01-19 DIAGNOSIS — Z8249 Family history of ischemic heart disease and other diseases of the circulatory system: Secondary | ICD-10-CM | POA: Diagnosis not present

## 2019-01-19 DIAGNOSIS — K219 Gastro-esophageal reflux disease without esophagitis: Secondary | ICD-10-CM | POA: Insufficient documentation

## 2019-01-19 DIAGNOSIS — Z833 Family history of diabetes mellitus: Secondary | ICD-10-CM | POA: Insufficient documentation

## 2019-01-19 DIAGNOSIS — O99343 Other mental disorders complicating pregnancy, third trimester: Secondary | ICD-10-CM | POA: Diagnosis not present

## 2019-01-19 DIAGNOSIS — Z8042 Family history of malignant neoplasm of prostate: Secondary | ICD-10-CM | POA: Insufficient documentation

## 2019-01-19 HISTORY — DX: Essential (primary) hypertension: I10

## 2019-01-19 LAB — PROTEIN / CREATININE RATIO, URINE
Creatinine, Urine: 80.44 mg/dL
Protein Creatinine Ratio: 0.11 mg/mg{Cre} (ref 0.00–0.15)
Total Protein, Urine: 9 mg/dL

## 2019-01-19 LAB — COMPREHENSIVE METABOLIC PANEL
ALT: 12 U/L (ref 0–44)
AST: 16 U/L (ref 15–41)
Albumin: 2.7 g/dL — ABNORMAL LOW (ref 3.5–5.0)
Alkaline Phosphatase: 83 U/L (ref 38–126)
Anion gap: 8 (ref 5–15)
BILIRUBIN TOTAL: 0.4 mg/dL (ref 0.3–1.2)
BUN: 13 mg/dL (ref 6–20)
CO2: 22 mmol/L (ref 22–32)
Calcium: 8.3 mg/dL — ABNORMAL LOW (ref 8.9–10.3)
Chloride: 104 mmol/L (ref 98–111)
Creatinine, Ser: 0.57 mg/dL (ref 0.44–1.00)
GFR calc Af Amer: >60 mL/min (ref >60)
GFR calc non Af Amer: >60 mL/min (ref >60)
Glucose, Bld: 89 mg/dL (ref 70–99)
Potassium: 3.3 mmol/L — ABNORMAL LOW (ref 3.5–5.1)
Sodium: 134 mmol/L — ABNORMAL LOW (ref 135–145)
Total Protein: 6.3 g/dL — ABNORMAL LOW (ref 6.5–8.1)

## 2019-01-19 LAB — CBC
HCT: 31.9 % — ABNORMAL LOW (ref 36.0–46.0)
Hemoglobin: 10.2 g/dL — ABNORMAL LOW (ref 12.0–15.0)
MCH: 28.1 pg (ref 26.0–34.0)
MCHC: 32 g/dL (ref 30.0–36.0)
MCV: 87.9 fL (ref 80.0–100.0)
NRBC: 0 % (ref 0.0–0.2)
Platelets: 290 10*3/uL (ref 150–400)
RBC: 3.63 MIL/uL — ABNORMAL LOW (ref 3.87–5.11)
RDW: 13.2 % (ref 11.5–15.5)
WBC: 9.5 10*3/uL (ref 4.0–10.5)

## 2019-01-19 LAB — URINALYSIS, ROUTINE W REFLEX MICROSCOPIC
BILIRUBIN URINE: NEGATIVE
Glucose, UA: NEGATIVE mg/dL
Hgb urine dipstick: NEGATIVE
Ketones, ur: NEGATIVE mg/dL
Nitrite: NEGATIVE
PH: 5 (ref 5.0–8.0)
Protein, ur: NEGATIVE mg/dL
SPECIFIC GRAVITY, URINE: 1.018 (ref 1.005–1.030)

## 2019-01-19 MED ORDER — ACETAMINOPHEN 500 MG PO TABS
1000.0000 mg | ORAL_TABLET | Freq: Once | ORAL | Status: AC
  Administered 2019-01-19: 1000 mg via ORAL
  Filled 2019-01-19: qty 2

## 2019-01-19 MED ORDER — LABETALOL HCL 100 MG PO TABS
100.0000 mg | ORAL_TABLET | Freq: Once | ORAL | Status: AC
  Administered 2019-01-19: 100 mg via ORAL
  Filled 2019-01-19: qty 1

## 2019-01-19 MED ORDER — LABETALOL HCL 100 MG PO TABS: 100.0000 mg | ORAL_TABLET | Freq: Two times a day (BID) | ORAL | 0 refills | Status: DC

## 2019-01-19 NOTE — Discharge Instructions (Signed)
Hypertension During Pregnancy ° °Hypertension, commonly called high blood pressure, is when the force of blood pumping through your arteries is too strong. Arteries are blood vessels that carry blood from the heart throughout the body. Hypertension during pregnancy can cause problems for you and your baby. Your baby may be born early (prematurely) or may not weigh as much as he or she should at birth. Very bad cases of hypertension during pregnancy can be life-threatening. °Different types of hypertension can occur during pregnancy. These include: °· Chronic hypertension. This happens when: °? You have hypertension before pregnancy and it continues during pregnancy. °? You develop hypertension before you are [redacted] weeks pregnant, and it continues during pregnancy. °· Gestational hypertension. This is hypertension that develops after the 20th week of pregnancy. °· Preeclampsia, also called toxemia of pregnancy. This is a very serious type of hypertension that develops during pregnancy. It can be very dangerous for you and your baby. °? In rare cases, you may develop preeclampsia after giving birth (postpartum preeclampsia). This usually occurs within 48 hours after childbirth but may occur up to 6 weeks after giving birth. °Gestational hypertension and preeclampsia usually go away within 6 weeks after your baby is born. Women who have hypertension during pregnancy have a greater chance of developing hypertension later in life or during future pregnancies. °What are the causes? °The exact cause of hypertension during pregnancy is not known. °What increases the risk? °There are certain factors that make it more likely for you to develop hypertension during pregnancy. These include: °· Having hypertension during a previous pregnancy or prior to pregnancy. °· Being overweight. °· Being age 35 or older. °· Being pregnant for the first time. °· Being pregnant with more than one baby. °· Becoming pregnant using fertilization  methods such as IVF (in vitro fertilization). °· Having diabetes, kidney problems, or systemic lupus erythematosus. °· Having a family history of hypertension. °What are the signs or symptoms? °Chronic hypertension and gestational hypertension rarely cause symptoms. Preeclampsia causes symptoms, which may include: °· Increased protein in your urine. Your health care provider will check for this at every visit before you give birth (prenatal visit). °· Severe headaches. °· Sudden weight gain. °· Swelling of the hands, face, legs, and feet. °· Nausea and vomiting. °· Vision problems, such as blurred or double vision. °· Numbness in the face, arms, legs, and feet. °· Dizziness. °· Slurred speech. °· Sensitivity to bright lights. °· Abdominal pain. °· Convulsions or seizures. °How is this diagnosed? °You may be diagnosed with hypertension during a routine prenatal exam. At each prenatal visit, you may: °· Have a urine test to check for high amounts of protein in your urine. °· Have your blood pressure checked. A blood pressure reading is given as two numbers, such as "120 over 80" (or 120/80). The first ("top") number is a measure of the pressure in your arteries when your heart beats (systolic pressure). The second ("bottom") number is a measure of the pressure in your arteries as your heart relaxes between beats (diastolic pressure). Blood pressure is measured in a unit called mm Hg. For most women, a normal blood pressure reading is: °? Systolic: below 120. °? Diastolic: below 80. °The type of hypertension that you are diagnosed with depends on your test results and when your symptoms developed. °· Chronic hypertension is usually diagnosed before 20 weeks of pregnancy. °· Gestational hypertension is usually diagnosed after 20 weeks of pregnancy. °· Hypertension with high amounts of protein in   the urine is diagnosed as preeclampsia.  Blood pressure measurements that stay above 035 systolic, or above 009 diastolic,  are signs of severe preeclampsia. How is this treated? Treatment for hypertension during pregnancy varies depending on the type of hypertension you have and how serious it is.  If you take medicines called ACE inhibitors to treat chronic hypertension, you may need to switch medicines. ACE inhibitors should not be taken during pregnancy.  If you have gestational hypertension, you may need to take blood pressure medicine.  If you are at risk for preeclampsia, your health care provider may recommend that you take a low-dose aspirin during your pregnancy.  If you have severe preeclampsia, you may need to be hospitalized so you and your baby can be monitored closely. You may also need to take medicine (magnesium sulfate) to prevent seizures and to lower blood pressure. This medicine may be given as an injection or through an IV.  In some cases, if your condition gets worse, you may need to deliver your baby early. Follow these instructions at home: Eating and drinking   Drink enough fluid to keep your urine pale yellow.  Avoid caffeine. Lifestyle  Do not use any products that contain nicotine or tobacco, such as cigarettes and e-cigarettes. If you need help quitting, ask your health care provider.  Do not use alcohol or drugs.  Avoid stress as much as possible. Rest and get plenty of sleep. General instructions  Take over-the-counter and prescription medicines only as told by your health care provider.  While lying down, lie on your left side. This keeps pressure off your major blood vessels.  While sitting or lying down, raise (elevate) your feet. Try putting some pillows under your lower legs.  Exercise regularly. Ask your health care provider what kinds of exercise are best for you.  Keep all prenatal and follow-up visits as told by your health care provider. This is important. Contact a health care provider if:  You have symptoms that your health care provider told you may  require more treatment or monitoring, such as: ? Nausea or vomiting. ? Headache. Get help right away if you have:  Severe abdominal pain that does not get better with treatment.  A severe headache that does not get better.  Vomiting that does not get better.  Sudden, rapid weight gain.  Sudden swelling in your hands, ankles, or face.  Vaginal bleeding.  Blood in your urine.  Fewer movements from your baby than usual.  Blurred or double vision.  Muscle twitching or sudden muscle tightening (spasms).  Shortness of breath.  Blue fingernails or lips. Summary  Hypertension, commonly called high blood pressure, is when the force of blood pumping through your arteries is too strong.  Hypertension during pregnancy can cause problems for you and your baby.  Treatment for hypertension during pregnancy varies depending on the type of hypertension you have and how serious it is.  Get help right away if you have symptoms that your health care provider told you to watch for. This information is not intended to replace advice given to you by your health care provider. Make sure you discuss any questions you have with your health care provider. Document Released: 07/03/2011 Document Revised: 10/01/2017 Document Reviewed: 03/30/2016 Elsevier Interactive Patient Education  2019 [employer].      Warning Signs During Pregnancy A pregnancy lasts about 40 weeks, starting from the first day of your last period until the baby is born. Pregnancy is divided into  three phases called trimesters.  The first trimester refers to week 1 through week 13 of pregnancy.  The second trimester is the start of week 14 through the end of week 27.  The third trimester is the start of week 28 until you deliver your baby. During each trimester of pregnancy, certain signs and symptoms may indicate a problem. Talk with your health care provider about your current health and any medical conditions you  have. Make sure you know the symptoms that you should watch for and report. How does this affect me?  Warning signs in the first trimester While some changes during the first trimester may be uncomfortable, most do not represent a serious problem. Let your health care provider know if you have any of the following warning signs in the first trimester:  You cannot eat or drink without vomiting, and this lasts for longer than a day.  You have vaginal bleeding or spotting along with menstrual-like cramping.  You have diarrhea for longer than a day.  You have a fever or other signs of infection, such as: ? Pain or burning when you urinate. ? Foul smelling or thick or yellowish vaginal discharge. Warning signs in the second trimester As your baby grows and changes during the second trimester, there are additional signs and symptoms that may indicate a problem. These include:  Signs and symptoms of infection, including a fever.  Signs or symptoms of a miscarriage or preterm labor, such as regular contractions, menstrual-like cramping, or lower abdominal pain.  Bloody or watery vaginal discharge or obvious vaginal bleeding.  Feeling like your heart is pounding.  Having trouble breathing.  Nausea, vomiting, or diarrhea that lasts for longer than a day.  Craving non-food items, such as clay, chalk, or dirt. This may be a sign of a very treatable medical condition called pica. Later in your second trimester, watch for signs and symptoms of a serious medical condition called preeclampsia.These include:  Changes in your vision.  A severe headache that does not go away.  Nausea and vomiting. It is also important to notice if your baby stops moving or moves less than usual during this time. Warning signs in the third trimester As you approach the third trimester, your baby is growing and your body is preparing for the birth of your baby. In your third trimester, be sure to let your health  care provider know if:  You have signs and symptoms of infection, including a fever.  You have vaginal bleeding.  You notice that your baby is moving less than usual or is not moving.  You have nausea, vomiting, or diarrhea that lasts for longer than a day.  You have a severe headache that does not go away.  You have vision changes, including seeing spots or having blurry or double vision.  You have increased swelling in your hands or face. How does this affect my baby? Throughout your pregnancy, always report any of the warning signs of a problem to your health care provider. This can help prevent complications that may affect your baby, including:  Increased risk for premature birth.  Infection that may be transmitted to your baby.  Increased risk for stillbirth. Contact a health care provider if:  You have any of the warning signs of a problem for the current trimester of your pregnancy.  Any of the following apply to you during any trimester of pregnancy: ? You have strong emotions, such as sadness or anxiety, that interfere with work  or personal relationships. ? You feel unsafe in your home and need help finding a safe place to live. ? You are using tobacco products, alcohol, or drugs and you need help to stop. Get help right away if: You have signs or symptoms of labor before 37 weeks of pregnancy. These include:  Contractions that are 5 minutes or less apart, or that increase in frequency, intensity, or length.  Sudden, sharp abdominal pain or low back pain.  Uncontrolled gush or trickle of fluid from your vagina. Summary  A pregnancy lasts about 40 weeks, starting from the first day of your last period until the baby is born. Pregnancy is divided into three phases called trimesters. Each trimester has warning signs to watch for.  Always report any warning signs to your health care provider in order to prevent complications that may affect both you and your  baby.  Talk with your health care provider about your current health and any medical conditions you have. Make sure you know the symptoms that you should watch for and report. This information is not intended to replace advice given to you by your health care provider. Make sure you discuss any questions you have with your health care provider. Document Released: 08/01/2017 Document Revised: 08/01/2017 Document Reviewed: 08/01/2017 Elsevier Interactive Patient Education  2019 [employer].

## 2019-01-19 NOTE — MAU Provider Note (Signed)
Chief Complaint:  Abdominal Pain   First Provider Initiated Contact with Patient 01/19/19 1533     HPI: [name] is a 43 y.o. [id number] at [redacted]w[redacted]d who presents to maternity admissions reporting abdominal pain. Symptoms started last night. Pain occurs whenever the baby moves. States when baby moves, she feels like her "hands are coming through her vagina". Denies LOF or vaginal bleeding. Good fetal movement.  Has chronic hypertension & is currently taking labetalol 50 mg BID. Reports headache today that is intermittent. Worse with light. Hasn't treated pain.   Location: abdomen & head Quality: aching Severity: 6/10 in pain scale Duration: 1 day Timing: intermittent Modifying factors: abdominal pain worse with fetal movement. Headache worse with lights.  Associated signs and symptoms: none   Past Medical History:  Diagnosis Date  . Carpal tunnel syndrome, bilateral   . Chronic hypertension   . Depression   . GERD (gastroesophageal reflux disease)   . H. pylori infection    dx  09-02-2014-- treated w/ antibiotic  . Hypertension    recently low pressures   . Irregular menstrual cycle   . Menorrhagia   . PCOS (polycystic ovarian syndrome)   . Type 2 diabetes mellitus ([place])    OB History  Gravida Para Term Preterm AB Living  3 2 1 1   2   SAB TAB Ectopic Multiple Live Births          2    # Outcome Date GA Lbr Len/2nd Weight Sex Delivery Anes PTL Lv  3 Current           2 Preterm 12/22/93 [redacted]w[redacted]d  1616 g M CS-LTranv   LIV  1 Term 12/03/92 [redacted]w[redacted]d  2892 g F Vag-Spont None  LIV   Past Surgical History:  Procedure Laterality Date  . CARPAL TUNNEL RELEASE Bilateral 10/07/2014   Procedure: CARPAL TUNNEL RELEASE BILATERAL;  Surgeon: [name], MD;  Location: [facility];  Service: Orthopedics;  Laterality: Bilateral;  . CESAREAN SECTION  1995  . GASTRIC ROUX-EN-Y N/A 03/12/2017   Procedure: LAPAROSCOPIC ROUX-EN-Y GASTRIC BYPASS WITH UPPER ENDOSCOPY;  Surgeon:  Kieth Brightly, Arta Bruce, MD;  Location: [facility];  Service: General;  Laterality: N/A;  . UPPER GI ENDOSCOPY  03/12/2017   Procedure: UPPER GI ENDOSCOPY;  Surgeon: Kieth Brightly Arta Bruce, MD;  Location: [facility];  Service: General;;   Family History  Problem Relation Age of Onset  . Heart disease Mother 35       CAD  . Diabetes Mother   . Hypertension Mother   . Cancer Father 60       prostate cancer  . Diabetes Father   . Thyroid disease Neg Hx    Social History   Tobacco Use  . Smoking status: Never Smoker  . Smokeless tobacco: Never Used  Substance Use Topics  . Alcohol use: Never    Alcohol/week: 0.0 standard drinks    Frequency: Never  . Drug use: No   Allergies  Allergen Reactions  . Food     RAW/FRESH FRUIT--PEACHES, PEARS, APPLES=THROAT CLOSES/SWELLING & COUGHING SHORTNESS OF BREATH  . 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 Shrimp [Shellfish Allergy] Anaphylaxis   Medications Prior to Admission  Medication Sig Dispense Refill Last Dose  . aspirin 81 MG chewable tablet Chew by mouth daily.   01/18/2019 at Unknown time  . CALCIUM PO Take by mouth.   01/19/2019 at Unknown time  . FLUoxetine (PROZAC) 20 MG tablet Take 1 tablet (20 mg total)  by mouth daily. 90 tablet 1 Past Month at Unknown time  . glucose blood (FREESTYLE TEST STRIPS) test strip Please fill with preferred strip and check sugar once daily 100 each 4 01/19/2019 at Unknown time  . levothyroxine (SYNTHROID, LEVOTHROID) 200 MCG tablet Take 1 tablet (200 mcg total) by mouth daily before breakfast. 30 tablet 2 01/19/2019 at Unknown time  . loratadine (CLARITIN) 10 MG tablet Take 1 tablet (10 mg total) by mouth daily. 90 tablet 3 01/19/2019 at Unknown time  . Multiple Vitamin (MULTIVITAMIN) tablet Take 1 tablet by mouth daily.   01/19/2019 at Unknown time  . nystatin (NYSTATIN) powder Apply 1 g topically 2 (two) times daily. 56 g 2 01/19/2019 at Unknown time  . nystatin cream (MYCOSTATIN) Apply 1 application topically 2 (two)  times daily. 30 g 5 01/19/2019 at Unknown time  . pantoprazole (PROTONIX) 40 MG tablet Take 1 tablet (40 mg total) by mouth daily. 30 tablet 1 Past Month at Unknown time  . [DISCONTINUED] labetalol (NORMODYNE) 100 MG tablet Take 50 mg by mouth 2 (two) times daily.   01/19/2019 at Unknown time  . fluticasone (FLONASE) 50 MCG/ACT nasal spray Place 2 sprays into both nostrils daily. 16 g 6 More than a month at Unknown time  . hydrocortisone (ANUSOL-HC) 25 MG suppository Place 1 suppository (25 mg total) rectally 2 (two) times daily. 12 suppository 0 More than a month at Unknown time    I have reviewed patient's Past Medical Hx, Surgical Hx, Family Hx, Social Hx, medications and allergies.   ROS:  Review of Systems  Constitutional: Negative.   Eyes: Positive for photophobia. Negative for visual disturbance.  Gastrointestinal: Positive for abdominal pain. Negative for constipation, diarrhea, nausea and vomiting.  Genitourinary: Negative.   Neurological: Positive for headaches.    Physical Exam   Patient Vitals for the past 24 hrs:  BP Temp Temp src Pulse Resp  01/19/19 1857 (!) 160/82 - - - -  01/19/19 1822 (!) 170/82 - - 61 -  01/19/19 1631 (!) 152/91 - - 63 -  01/19/19 1616 (!) 143/79 - - 67 -  01/19/19 1601 (!) 153/77 - - 68 -  01/19/19 1546 (!) 144/80 - - 65 -  01/19/19 1531 (!) 155/76 - - 64 -  01/19/19 1527 (!) 144/83 - - 67 -  01/19/19 1519 (!) 155/84 98.1 F (36.7 C) Oral 63 18    Constitutional: Well-developed, well-nourished female in no acute distress.  Cardiovascular: normal rate & rhythm, no murmur Respiratory: normal effort, lung sounds clear throughout GI: Abd soft, non-tender, gravid appropriate for gestational age. Pos BS x 4 MS: Extremities nontender, no edema, normal ROM Neurologic: Alert and oriented x 4.  GU:   Dilation: Closed Effacement (%): Thick Exam by:: Purcell Nails, NP  NST:  Baseline: 145 bpm, Variability: Good {> 6 bpm), Accelerations: Non-reactive but  appropriate for gestational age and Decelerations: Absent   Labs: Results for orders placed or performed during the hospital encounter of 01/19/19 (from the past 24 hour(s))  Urinalysis, Routine w reflex microscopic     Status: Abnormal   Collection Time: 01/19/19  3:07 PM  Result Value Ref Range   Color, Urine YELLOW YELLOW   APPearance HAZY (A) CLEAR   Specific Gravity, Urine 1.018 1.005 - 1.030   pH 5.0 5.0 - 8.0   Glucose, UA NEGATIVE NEGATIVE mg/dL   Hgb urine dipstick NEGATIVE NEGATIVE   Bilirubin Urine NEGATIVE NEGATIVE   Ketones, ur NEGATIVE NEGATIVE mg/dL  Protein, ur NEGATIVE NEGATIVE mg/dL   Nitrite NEGATIVE NEGATIVE   Leukocytes,Ua LARGE (A) NEGATIVE   RBC / HPF 6-10 0 - 5 RBC/hpf   WBC, UA 6-10 0 - 5 WBC/hpf   Bacteria, UA RARE (A) NONE SEEN   Squamous Epithelial / LPF 6-10 0 - 5   Mucus PRESENT   Protein / creatinine ratio, urine     Status: None   Collection Time: 01/19/19  3:07 PM  Result Value Ref Range   Creatinine, Urine 80.44 mg/dL   Total Protein, Urine 9 mg/dL   Protein Creatinine Ratio 0.11 0.00 - 0.15 mg/mg[Cre]  CBC     Status: Abnormal   Collection Time: 01/19/19  3:43 PM  Result Value Ref Range   WBC 9.5 4.0 - 10.5 K/uL   RBC 3.63 (L) 3.87 - 5.11 MIL/uL   Hemoglobin 10.2 (L) 12.0 - 15.0 g/dL   HCT 31.9 (L) 36.0 - 46.0 %   MCV 87.9 80.0 - 100.0 fL   MCH 28.1 26.0 - 34.0 pg   MCHC 32.0 30.0 - 36.0 g/dL   RDW 13.2 11.5 - 15.5 %   Platelets 290 150 - 400 K/uL   nRBC 0.0 0.0 - 0.2 %  Comprehensive metabolic panel     Status: Abnormal   Collection Time: 01/19/19  3:43 PM  Result Value Ref Range   Sodium 134 (L) 135 - 145 mmol/L   Potassium 3.3 (L) 3.5 - 5.1 mmol/L   Chloride 104 98 - 111 mmol/L   CO2 22 22 - 32 mmol/L   Glucose, Bld 89 70 - 99 mg/dL   BUN 13 6 - 20 mg/dL   Creatinine, Ser 0.57 0.44 - 1.00 mg/dL   Calcium 8.3 (L) 8.9 - 10.3 mg/dL   Total Protein 6.3 (L) 6.5 - 8.1 g/dL   Albumin 2.7 (L) 3.5 - 5.0 g/dL   AST 16 15 - 41 U/L    ALT 12 0 - 44 U/L   Alkaline Phosphatase 83 38 - 126 U/L   Total Bilirubin 0.4 0.3 - 1.2 mg/dL   GFR calc non Af Amer >60 >60 mL/min   GFR calc Af Amer >60 >60 mL/min   Anion gap 8 5 - 15    Imaging:  No results found.  MAU Course: Orders Placed This Encounter  Procedures  . Culture, OB Urine  . Urinalysis, Routine w reflex microscopic  . CBC  . Comprehensive metabolic panel  . Protein / creatinine ratio, urine  . Discharge patient   Meds ordered this encounter  Medications  . acetaminophen (TYLENOL) tablet 1,000 mg  . labetalol (NORMODYNE) 100 MG tablet    Sig: Take 1 tablet (100 mg total) by mouth 2 (two) times daily.    Dispense:  60 tablet    Refill:  0    Order Specific Question:   Supervising Provider    Answer:   ERVIN, MICHAEL L [[id number]]  . labetalol (NORMODYNE) tablet 100 mg    MDM: Fetal tracing appropriate for gestation. No ctx traced on monitor & abdomen soft. Cervix closed/thick. Discomfort only associated with fetal movement and likely not r/t PTL. Pt given good return precaution regarding preterm labor, especially considering her hx.   U/a with leuks. Pt denies urinary symptoms. Urine culture pending.   Patient with elevated BPs that are higher than her baseline. PEC labs done & reassuring. Tylenol 1 gm given for headache with complete resolution of pain. Plan to discharge with increase in labetalol up to  100 mg BID. Pt has f/u in office next week.   At time of discharge, pt had severe range BP. Given dose of oral labetalol prior to discharge. Consulted with Dr. Rosana Hoes. Will have patient f/u in office for BP check in 2 days. Msg left on [employer] ob voice mail regarding prompt follow up for pt. Discussed HTN related return precautions with patient.   Assessment: 1. Chronic hypertension complicating or reason for care during pregnancy, third trimester   2. [redacted] weeks gestation of pregnancy   3. Abdominal pain during pregnancy in third trimester      Plan: Discharge home in stable condition.  Preterm Labor & HTN precautions and fetal kick counts Follow-up Information    Ob/Gyn, [facility] Follow up.   Contact information: [place] [address] [phone]           Allergies as of 01/19/2019      Reactions   Food    RAW/FRESH FRUIT--PEACHES, PEARS, APPLES=THROAT CLOSES/SWELLING & COUGHING SHORTNESS OF BREATH   Peach [prunus Persica] Anaphylaxis   Shrimp [shellfish Allergy] Anaphylaxis      Medication List    TAKE these medications   aspirin 81 MG chewable tablet Chew by mouth daily.   CALCIUM PO Take by mouth.   FLUoxetine 20 MG tablet Commonly known as:  PROZAC Take 1 tablet (20 mg total) by mouth daily.   fluticasone 50 MCG/ACT nasal spray Commonly known as:  FLONASE Place 2 sprays into both nostrils daily.   glucose blood test strip Commonly known as:  FREESTYLE TEST STRIPS Please fill with preferred strip and check sugar once daily   hydrocortisone 25 MG suppository Commonly known as:  ANUSOL-HC Place 1 suppository (25 mg total) rectally 2 (two) times daily.   labetalol 100 MG tablet Commonly known as:  NORMODYNE Take 1 tablet (100 mg total) by mouth 2 (two) times daily. What changed:  how much to take   levothyroxine 200 MCG tablet Commonly known as:  SYNTHROID, LEVOTHROID Take 1 tablet (200 mcg total) by mouth daily before breakfast.   loratadine 10 MG tablet Commonly known as:  CLARITIN Take 1 tablet (10 mg total) by mouth daily.   multivitamin tablet Take 1 tablet by mouth daily.   nystatin cream Commonly known as:  MYCOSTATIN Apply 1 application topically 2 (two) times daily.   nystatin powder Commonly known as:  nystatin Apply 1 g topically 2 (two) times daily.   pantoprazole 40 MG tablet Commonly known as:  Protonix Take 1 tablet (40 mg total) by mouth daily.       Jorje Guild, NP 01/19/2019 7:05 PM

## 2019-01-19 NOTE — MAU Note (Addendum)
Pt presents to MAU with c/o lower abdominal pain that started yesterday. Pt denies VB and LOF. She has hx of preterm delivery at 25 weeks. Pt also complaining of HA and blurred vision, on medication for chronic HTN. +FM

## 2019-01-22 ENCOUNTER — Encounter: Payer: Self-pay | Admitting: Student

## 2019-01-22 DIAGNOSIS — R8271 Bacteriuria: Secondary | ICD-10-CM | POA: Insufficient documentation

## 2019-01-22 LAB — CULTURE, OB URINE: Culture: 10000 — AB

## 2019-02-05 ENCOUNTER — Telehealth: Payer: Self-pay | Admitting: Endocrinology

## 2019-02-05 NOTE — Telephone Encounter (Signed)
Please advise what I need to do with this. I am seeing the letter in [place] and a note stating it was mailed. I am not sure why she has expressed such discontent re: her labs and appt conversion.

## 2019-02-05 NOTE — Telephone Encounter (Signed)
Patient returned Ammie's call for possibly doing a virtual visit, patient became upset and irritated and disagreed on doing a virtual visit with Dr. Loanne Drilling. She then became upset because she had an appointment on 01/01/19 and never received a call about her results and also did not receive the letter that was mailed on 01/02/19.  Please Advise, Thanks

## 2019-02-09 NOTE — Telephone Encounter (Signed)
Attempted to call the pt to address concerns, no answer, no voicemail, disconnected

## 2019-02-10 ENCOUNTER — Encounter: Payer: Self-pay | Admitting: Endocrinology

## 2019-02-10 ENCOUNTER — Ambulatory Visit (INDEPENDENT_AMBULATORY_CARE_PROVIDER_SITE_OTHER): Payer: Medicaid Other | Admitting: Endocrinology

## 2019-02-10 ENCOUNTER — Other Ambulatory Visit: Payer: Self-pay

## 2019-02-10 VITALS — BP 140/60 | HR 70 | Ht 62.0 in | Wt 228.2 lb

## 2019-02-10 DIAGNOSIS — E890 Postprocedural hypothyroidism: Secondary | ICD-10-CM

## 2019-02-10 LAB — TSH: TSH: 0.49 u[IU]/mL (ref 0.35–4.50)

## 2019-02-10 LAB — T4, FREE: Free T4: 0.88 ng/dL (ref 0.60–1.60)
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
## 2019-02-10 NOTE — Patient Instructions (Addendum)
Blood tests are requested for you today.  We'll let you know about the results.  Please come back for a follow-up appointment in 1 month.  

## 2019-02-23 ENCOUNTER — Encounter (HOSPITAL_COMMUNITY): Payer: Self-pay | Admitting: *Deleted

## 2019-02-23 ENCOUNTER — Inpatient Hospital Stay (HOSPITAL_COMMUNITY)
Admission: AD | Admit: 2019-02-23 | Discharge: 2019-02-23 | Disposition: A | Discharge status: Home / Self Care | Payer: Medicaid Other | Attending: Obstetrics and Gynecology | Admitting: Obstetrics and Gynecology

## 2019-02-23 ENCOUNTER — Other Ambulatory Visit: Payer: Self-pay

## 2019-02-23 DIAGNOSIS — Z7982 Long term (current) use of aspirin: Secondary | ICD-10-CM | POA: Diagnosis not present

## 2019-02-23 DIAGNOSIS — Z833 Family history of diabetes mellitus: Secondary | ICD-10-CM | POA: Insufficient documentation

## 2019-02-23 DIAGNOSIS — K219 Gastro-esophageal reflux disease without esophagitis: Secondary | ICD-10-CM | POA: Diagnosis not present

## 2019-02-23 DIAGNOSIS — Z3A33 33 weeks gestation of pregnancy: Secondary | ICD-10-CM | POA: Diagnosis not present

## 2019-02-23 DIAGNOSIS — Z91018 Allergy to other foods: Secondary | ICD-10-CM | POA: Insufficient documentation

## 2019-02-23 DIAGNOSIS — Z79890 Hormone replacement therapy (postmenopausal): Secondary | ICD-10-CM | POA: Diagnosis not present

## 2019-02-23 DIAGNOSIS — Z8632 Personal history of gestational diabetes: Secondary | ICD-10-CM | POA: Insufficient documentation

## 2019-02-23 DIAGNOSIS — O163 Unspecified maternal hypertension, third trimester: Secondary | ICD-10-CM | POA: Insufficient documentation

## 2019-02-23 DIAGNOSIS — O99283 Endocrine, nutritional and metabolic diseases complicating pregnancy, third trimester: Secondary | ICD-10-CM | POA: Insufficient documentation

## 2019-02-23 DIAGNOSIS — O99013 Anemia complicating pregnancy, third trimester: Secondary | ICD-10-CM

## 2019-02-23 DIAGNOSIS — E1165 Type 2 diabetes mellitus with hyperglycemia: Secondary | ICD-10-CM | POA: Diagnosis not present

## 2019-02-23 DIAGNOSIS — Z7951 Long term (current) use of inhaled steroids: Secondary | ICD-10-CM | POA: Diagnosis not present

## 2019-02-23 DIAGNOSIS — Z8249 Family history of ischemic heart disease and other diseases of the circulatory system: Secondary | ICD-10-CM | POA: Diagnosis not present

## 2019-02-23 DIAGNOSIS — O99843 Bariatric surgery status complicating pregnancy, third trimester: Secondary | ICD-10-CM | POA: Diagnosis not present

## 2019-02-23 DIAGNOSIS — Z8042 Family history of malignant neoplasm of prostate: Secondary | ICD-10-CM | POA: Diagnosis not present

## 2019-02-23 DIAGNOSIS — Z91013 Allergy to seafood: Secondary | ICD-10-CM | POA: Diagnosis not present

## 2019-02-23 DIAGNOSIS — O99613 Diseases of the digestive system complicating pregnancy, third trimester: Secondary | ICD-10-CM | POA: Diagnosis not present

## 2019-02-23 DIAGNOSIS — D649 Anemia, unspecified: Secondary | ICD-10-CM | POA: Diagnosis not present

## 2019-02-23 DIAGNOSIS — O119 Pre-existing hypertension with pre-eclampsia, unspecified trimester: Secondary | ICD-10-CM

## 2019-02-23 DIAGNOSIS — O113 Pre-existing hypertension with pre-eclampsia, third trimester: Secondary | ICD-10-CM | POA: Diagnosis not present

## 2019-02-23 DIAGNOSIS — Z79899 Other long term (current) drug therapy: Secondary | ICD-10-CM | POA: Insufficient documentation

## 2019-02-23 DIAGNOSIS — Z3689 Encounter for other specified antenatal screening: Secondary | ICD-10-CM

## 2019-02-23 DIAGNOSIS — E039 Hypothyroidism, unspecified: Secondary | ICD-10-CM | POA: Insufficient documentation

## 2019-02-23 DIAGNOSIS — O24313 Unspecified pre-existing diabetes mellitus in pregnancy, third trimester: Secondary | ICD-10-CM

## 2019-02-23 HISTORY — DX: Gestational diabetes mellitus in pregnancy, unspecified control: O24.419

## 2019-02-23 HISTORY — DX: Hypothyroidism, unspecified: E03.9

## 2019-02-23 LAB — PROTEIN / CREATININE RATIO, URINE
Creatinine, Urine: 41.53 mg/dL
Protein Creatinine Ratio: 0.41 mg/mg{Cre} — ABNORMAL HIGH (ref 0.00–0.15)
Total Protein, Urine: 17 mg/dL

## 2019-02-23 LAB — CBC
HCT: 32.4 % — ABNORMAL LOW (ref 36.0–46.0)
Hemoglobin: 10.5 g/dL — ABNORMAL LOW (ref 12.0–15.0)
MCH: 27.8 pg (ref 26.0–34.0)
MCHC: 32.4 g/dL (ref 30.0–36.0)
MCV: 85.7 fL (ref 80.0–100.0)
Platelets: 311 10*3/uL (ref 150–400)
RBC: 3.78 MIL/uL — ABNORMAL LOW (ref 3.87–5.11)
RDW: 13.2 % (ref 11.5–15.5)
WBC: 8.2 10*3/uL (ref 4.0–10.5)
nRBC: 0 % (ref 0.0–0.2)

## 2019-02-23 LAB — COMPREHENSIVE METABOLIC PANEL
ALT: 13 U/L (ref 0–44)
AST: 16 U/L (ref 15–41)
Albumin: 2.4 g/dL — ABNORMAL LOW (ref 3.5–5.0)
Alkaline Phosphatase: 108 U/L (ref 38–126)
Anion gap: 7 (ref 5–15)
BUN: 13 mg/dL (ref 6–20)
CO2: 24 mmol/L (ref 22–32)
Calcium: 8.2 mg/dL — ABNORMAL LOW (ref 8.9–10.3)
Chloride: 106 mmol/L (ref 98–111)
Creatinine, Ser: 0.61 mg/dL (ref 0.44–1.00)
GFR calc Af Amer: >60 mL/min (ref >60)
GFR calc non Af Amer: >60 mL/min (ref >60)
Glucose, Bld: 73 mg/dL (ref 70–99)
Potassium: 3.6 mmol/L (ref 3.5–5.1)
Sodium: 137 mmol/L (ref 135–145)
Total Bilirubin: 0.4 mg/dL (ref 0.3–1.2)
Total Protein: 5.6 g/dL — ABNORMAL LOW (ref 6.5–8.1)

## 2019-02-23 LAB — URINALYSIS, ROUTINE W REFLEX MICROSCOPIC
Bilirubin Urine: NEGATIVE
Glucose, UA: >=500 mg/dL — AB
Ketones, ur: NEGATIVE mg/dL
Nitrite: NEGATIVE
Protein, ur: NEGATIVE mg/dL
Specific Gravity, Urine: 1.008 (ref 1.005–1.030)
pH: 5 (ref 5.0–8.0)

## 2019-02-23 LAB — GLUCOSE, CAPILLARY: Glucose-Capillary: 89 mg/dL (ref 70–99)

## 2019-02-23 NOTE — MAU Note (Signed)
Pt came in by EMS, pt states she began feeling dizzy & her legs were shaking @ 0515.   CBG's have been elevated for the last 5 days.  C/O nausea but no vomiting.  Pt denies contractions, bleeding or LOF.  Pt states baby is moving "too much."  CBG was 196 @ 0340 per pt.

## 2019-02-23 NOTE — MAU Provider Note (Signed)
History     CSN: [id number]  Arrival date and time: 02/23/19 3500   First Provider Initiated Contact with Patient 02/23/19 0809      Chief Complaint  Patient presents with  . Dizziness  . Nausea  . Hyperglycemia   G3P1102 @33 .4 wks presenting via EMS with episode of dizziness. Episode happened while at work around 0545. Reports feeling dizzy, nauseated, her legs were shaking, and blurry vision. No syncope. She checked her BS and it was 196. She had a [place] and cheese sandwich before work. Reports her BS started running high last week 160s-200s. She is not taking anything for DM. No HA. No VB, LOF, or ctx. +FM. She took her other meds this am> Labetalol and Synthroid.    OB History    Gravida  3   Para  2   Term  1   Preterm  1   AB      Living  2     SAB      TAB      Ectopic      Multiple      Live Births  2           Past Medical History:  Diagnosis Date  . Carpal tunnel syndrome, bilateral   . Chronic hypertension   . Depression   . GERD (gastroesophageal reflux disease)   . Gestational diabetes   . H. pylori infection    dx  09-02-2014-- treated w/ antibiotic  . Hypertension    recently low pressures   . Hypothyroid   . Irregular menstrual cycle   . Menorrhagia   . PCOS (polycystic ovarian syndrome)   . Type 2 diabetes mellitus ([place])     Past Surgical History:  Procedure Laterality Date  . CARPAL TUNNEL RELEASE Bilateral 10/07/2014   Procedure: CARPAL TUNNEL RELEASE BILATERAL;  Surgeon: [name], MD;  Location: [facility];  Service: Orthopedics;  Laterality: Bilateral;  . CESAREAN SECTION  1995  . GASTRIC ROUX-EN-Y N/A 03/12/2017   Procedure: LAPAROSCOPIC ROUX-EN-Y GASTRIC BYPASS WITH UPPER ENDOSCOPY;  Surgeon: Kieth Brightly, Arta Bruce, MD;  Location: [facility];  Service: General;  Laterality: N/A;  . UPPER GI ENDOSCOPY  03/12/2017   Procedure: UPPER GI ENDOSCOPY;  Surgeon: Kieth Brightly Arta Bruce, MD;  Location: [facility];   Service: General;;    Family History  Problem Relation Age of Onset  . Heart disease Mother 54       CAD  . Diabetes Mother   . Hypertension Mother   . Cancer Father 55       prostate cancer  . Diabetes Father   . Thyroid disease Neg Hx     Social History   Tobacco Use  . Smoking status: Never Smoker  . Smokeless tobacco: Never Used  Substance Use Topics  . Alcohol use: Never    Alcohol/week: 0.0 standard drinks    Frequency: Never  . Drug use: No    Allergies:  Allergies  Allergen Reactions  . Food     RAW/FRESH FRUIT--PEACHES, PEARS, APPLES=THROAT CLOSES/SWELLING & COUGHING SHORTNESS OF BREATH  . 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 Shrimp [Shellfish Allergy] Anaphylaxis    Medications Prior to Admission  Medication Sig Dispense Refill Last Dose  . aspirin 81 MG chewable tablet Chew by mouth daily.   Taking  . CALCIUM PO Take by mouth.   Taking  . FLUoxetine (PROZAC) 20 MG tablet Take 1 tablet (20 mg total) by mouth daily. 90 tablet  1 Taking  . fluticasone (FLONASE) 50 MCG/ACT nasal spray Place 2 sprays into both nostrils daily. 16 g 6 Taking  . glucose blood (FREESTYLE TEST STRIPS) test strip Please fill with preferred strip and check sugar once daily 100 each 4 Taking  . hydrocortisone (ANUSOL-HC) 25 MG suppository Place 1 suppository (25 mg total) rectally 2 (two) times daily. 12 suppository 0 Taking  . labetalol (NORMODYNE) 100 MG tablet Take 1 tablet (100 mg total) by mouth 2 (two) times daily. 60 tablet 0 Taking  . levothyroxine (SYNTHROID, LEVOTHROID) 200 MCG tablet Take 1 tablet (200 mcg total) by mouth daily before breakfast. 30 tablet 2 Taking  . loratadine (CLARITIN) 10 MG tablet Take 1 tablet (10 mg total) by mouth daily. 90 tablet 3 Taking  . Multiple Vitamin (MULTIVITAMIN) tablet Take 1 tablet by mouth daily.   Taking  . nystatin (NYSTATIN) powder Apply 1 g topically 2 (two) times daily. 56 g 2 Taking  . nystatin cream (MYCOSTATIN) Apply 1 application  topically 2 (two) times daily. 30 g 5 Taking  . pantoprazole (PROTONIX) 40 MG tablet Take 1 tablet (40 mg total) by mouth daily. 30 tablet 1 Taking    Review of Systems  Constitutional: Negative for fever.  Eyes: Positive for visual disturbance.  Gastrointestinal: Positive for nausea. Negative for abdominal pain.  Neurological: Positive for dizziness. Negative for syncope.   Physical Exam   Blood pressure (!) 136/59, pulse (!) 56, temperature 98 F (36.7 C), temperature source Oral, resp. rate 16, last menstrual period 07/03/2018, SpO2 99 %.   Patient Vitals for the past 24 hrs:  BP Temp Temp src Pulse Resp SpO2  02/23/19 1113 (!) 136/59 - - (!) 56 - -  02/23/19 1105 (!) 159/82 - - 65 - -  02/23/19 1104 - - - - 16 -  02/23/19 0739 (!) 120/57 - - 67 - -  02/23/19 0721 - - - - - 99 %  02/23/19 0718 (!) 111/57 98 F (36.7 C) Oral 77 18 -   Orthostatic VS for the past 24 hrs:  BP- Lying Pulse- Lying BP- Sitting Pulse- Sitting BP- Standing at 0 minutes Pulse- Standing at 0 minutes  02/23/19 0839 128/75 61 135/73 63 120/72 77    Physical Exam  Nursing note and vitals reviewed. Constitutional: She is oriented to person, place, and time. She appears well-developed and well-nourished. No distress.  HENT:  Head: Normocephalic and atraumatic.  Neck: Normal range of motion.  Cardiovascular: Normal rate, regular rhythm and normal heart sounds.  Respiratory: Effort normal and breath sounds normal. No respiratory distress. She has no wheezes. She has no rales.  GI: Soft. She exhibits no distension. There is no abdominal tenderness.  gravid  Musculoskeletal: Normal range of motion.  Neurological: She is alert and oriented to person, place, and time.  Skin: Skin is warm and dry.  Psychiatric: She has a normal mood and affect.  EFM: 140 bpm, mod variability, + accels, no decels Toco: none  Results for orders placed or performed during the hospital encounter of 02/23/19 (from the past 24  hour(s))  Glucose, capillary     Status: None   Collection Time: 02/23/19  7:31 AM  Result Value Ref Range   Glucose-Capillary 89 70 - 99 mg/dL  Urinalysis, Routine w reflex microscopic     Status: Abnormal   Collection Time: 02/23/19  7:41 AM  Result Value Ref Range   Color, Urine YELLOW YELLOW   APPearance CLOUDY (A) CLEAR  Specific Gravity, Urine 1.008 1.005 - 1.030   pH 5.0 5.0 - 8.0   Glucose, UA >=500 (A) NEGATIVE mg/dL   Hgb urine dipstick SMALL (A) NEGATIVE   Bilirubin Urine NEGATIVE NEGATIVE   Ketones, ur NEGATIVE NEGATIVE mg/dL   Protein, ur NEGATIVE NEGATIVE mg/dL   Nitrite NEGATIVE NEGATIVE   Leukocytes,Ua LARGE (A) NEGATIVE   RBC / HPF 21-50 0 - 5 RBC/hpf   WBC, UA 21-50 0 - 5 WBC/hpf   Bacteria, UA MANY (A) NONE SEEN   Squamous Epithelial / LPF 11-20 0 - 5   Budding Yeast PRESENT   Protein / creatinine ratio, urine     Status: Abnormal   Collection Time: 02/23/19  7:41 AM  Result Value Ref Range   Creatinine, Urine 41.53 mg/dL   Total Protein, Urine 17 mg/dL   Protein Creatinine Ratio 0.41 (H) 0.00 - 0.15 mg/mg[Cre]  CBC     Status: Abnormal   Collection Time: 02/23/19  8:34 AM  Result Value Ref Range   WBC 8.2 4.0 - 10.5 K/uL   RBC 3.78 (L) 3.87 - 5.11 MIL/uL   Hemoglobin 10.5 (L) 12.0 - 15.0 g/dL   HCT 32.4 (L) 36.0 - 46.0 %   MCV 85.7 80.0 - 100.0 fL   MCH 27.8 26.0 - 34.0 pg   MCHC 32.4 30.0 - 36.0 g/dL   RDW 13.2 11.5 - 15.5 %   Platelets 311 150 - 400 K/uL   nRBC 0.0 0.0 - 0.2 %  Comprehensive metabolic panel     Status: Abnormal   Collection Time: 02/23/19  8:34 AM  Result Value Ref Range   Sodium 137 135 - 145 mmol/L   Potassium 3.6 3.5 - 5.1 mmol/L   Chloride 106 98 - 111 mmol/L   CO2 24 22 - 32 mmol/L   Glucose, Bld 73 70 - 99 mg/dL   BUN 13 6 - 20 mg/dL   Creatinine, Ser 0.61 0.44 - 1.00 mg/dL   Calcium 8.2 (L) 8.9 - 10.3 mg/dL   Total Protein 5.6 (L) 6.5 - 8.1 g/dL   Albumin 2.4 (L) 3.5 - 5.0 g/dL   AST 16 15 - 41 U/L   ALT 13 0 - 44  U/L   Alkaline Phosphatase 108 38 - 126 U/L   Total Bilirubin 0.4 0.3 - 1.2 mg/dL   GFR calc non Af Amer >60 >60 mL/min   GFR calc Af Amer >60 >60 mL/min   Anion gap 7 5 - 15   MAU Course  Procedures  MDM Chart review: pregnancy complicated by CHTN, Type II DM, hx gastric bypass, hx of CS @26  wks for PROM, Syphilis at age 80, Anxiety, Hypothyroidsim, AMA, fetus with T21. Labs ordered and reviewed. Suspect labile BS causing her sx. Pt feeling better since arrival. She is picking up a new BS monitor today (thought her old one wasn't accurate). New dx today of superimposed PEC w/o severe features, recommend f/u in office this week- office notified. Stable for discharge home.   Assessment and Plan   1. [redacted] weeks gestation of pregnancy   2. NST (non-stress test) reactive   3. Preexisting diabetes complicating pregnancy in third trimester, antepartum   4. Anemia during pregnancy in third trimester   5. Chronic hypertension with superimposed preeclampsia    Discharge home Follow up at [facility] this week PEC/return precautions  Allergies as of 02/23/2019      Reactions   Food    RAW/FRESH FRUIT--PEACHES, PEARS, APPLES=THROAT  CLOSES/SWELLING & COUGHING SHORTNESS OF BREATH   Peach [prunus Persica] Anaphylaxis   Shrimp [shellfish Allergy] Anaphylaxis      Medication List    TAKE these medications   aspirin 81 MG chewable tablet Chew by mouth daily.   CALCIUM PO Take by mouth.   FLUoxetine 20 MG tablet Commonly known as:  PROZAC Take 1 tablet (20 mg total) by mouth daily.   fluticasone 50 MCG/ACT nasal spray Commonly known as:  FLONASE Place 2 sprays into both nostrils daily.   glucose blood test strip Commonly known as:  FREESTYLE TEST STRIPS Please fill with preferred strip and check sugar once daily   hydrocortisone 25 MG suppository Commonly known as:  ANUSOL-HC Place 1 suppository (25 mg total) rectally 2 (two) times daily.   labetalol 100 MG tablet Commonly  known as:  NORMODYNE Take 1 tablet (100 mg total) by mouth 2 (two) times daily.   levothyroxine 200 MCG tablet Commonly known as:  SYNTHROID Take 1 tablet (200 mcg total) by mouth daily before breakfast.   loratadine 10 MG tablet Commonly known as:  CLARITIN Take 1 tablet (10 mg total) by mouth daily.   multivitamin tablet Take 1 tablet by mouth daily.   nystatin cream Commonly known as:  MYCOSTATIN Apply 1 application topically 2 (two) times daily.   nystatin powder Commonly known as:  nystatin Apply 1 g topically 2 (two) times daily.   pantoprazole 40 MG tablet Commonly known as:  Protonix Take 1 tablet (40 mg total) by mouth daily.       Julianne Handler, CNM 02/23/2019, 11:30 AM

## 2019-02-23 NOTE — MAU Note (Signed)
Called lab to get update on PCR. Was told the tube was in a rack ready to go on the machine and would take another 30-45 minutes. Provider Colman Cater CNM notified.

## 2019-02-23 NOTE — Discharge Instructions (Signed)
Type 1 or Type 2 Diabetes Mellitus During Pregnancy, Diagnosis Type 1 diabetes (type 1 diabetes mellitus) and type 2 diabetes (type 2 diabetes mellitus) are long-term (chronic) diseases. Your diabetes may be caused by one or both of these problems:  Your pancreas does not make enough of a hormone called insulin.  Your pancreas does not respond in a normal way to insulin that it makes. Insulin lets sugars (glucose) go into cells in the body. This gives you energy. If you have diabetes, sugars cannot get into cells. This causes high blood sugar (hyperglycemia). If diabetes is treated, it may not hurt you or your baby. Your doctor will set treatment goals for you. In general, you should have these blood sugar levels:  After not eating for a long time (fasting): 95 mg/dL (5.3 mmol/L).  After meals (postprandial): ? One hour after a meal: at or below 140 mg/dL (7.8 mmol/L). ? Two hours after a meal: at or below 120 mg/dL (6.7 mmol/L).  A1c (hemoglobin A1c) level: 6-6.5%. Follow these instructions at home: Questions to ask your doctor  You may want to ask these questions: ? Do I need to meet with a diabetes educator? ? Where can I find a support group for people with diabetes? ? What equipment will I need to care for myself at home? ? What medicines do I need? When should I take them? ? How often do I need to check my blood sugar? ? What number can I call if I have questions? ? When is my next doctor's visit? General instructions  Take over-the-counter and prescription medicines only as told by your doctor.  Stay at a healthy weight during pregnancy.  Keep all follow-up visits as told by your doctor. This is important. Contact a doctor if:  Your blood sugar is at or above 240 mg/dL (13.3 mmol/L).  Your blood sugar is at or above 200 mg/dL (11.1 mmol/L), and you have ketones in your pee (urine).  You have been sick or have had a fever for 2 days or more and you are not getting  better.  You have any of these problems for more than 6 hours: ? You cannot eat or drink. ? You feel sick to your stomach (nauseous). ? You throw up (vomit). ? You have watery poop (diarrhea). Get help right away if:  Your blood sugar is lower than 54 mg/dL (3 mmol/L).  You get confused.  You have trouble: ? Thinking clearly. ? Breathing.  Your baby moves less than normal.  You have: ? Moderate or large ketone levels in your pee. ? Vaginal bleeding. ? Unusual fluid coming from your vagina. ? Early contractions. These may feel like tightness in your belly. Summary  Type 1 diabetes (type 1 diabetes mellitus) and type 2 diabetes (type 2 diabetes mellitus) are long-term (chronic) diseases.  If diabetes is treated, it may not hurt you or your baby.  Your doctor will set treatment goals for you. This information is not intended to replace advice given to you by your health care provider. Make sure you discuss any questions you have with your health care provider. Document Released: 02/06/2016 Document Revised: 06/10/2017 Document Reviewed: 11/18/2015 Elsevier Interactive Patient Education  2019 [employer].

## 2019-03-11 ENCOUNTER — Other Ambulatory Visit: Payer: Self-pay

## 2019-03-11 ENCOUNTER — Observation Stay (HOSPITAL_COMMUNITY)
Admission: AD | Admit: 2019-03-11 | Discharge: 2019-03-12 | Disposition: A | Discharge status: Home / Self Care | Payer: Medicaid Other | Attending: Obstetrics and Gynecology | Admitting: Obstetrics and Gynecology

## 2019-03-11 ENCOUNTER — Inpatient Hospital Stay (HOSPITAL_COMMUNITY): Payer: Medicaid Other

## 2019-03-11 ENCOUNTER — Encounter (HOSPITAL_COMMUNITY): Payer: Self-pay

## 2019-03-11 DIAGNOSIS — O10919 Unspecified pre-existing hypertension complicating pregnancy, unspecified trimester: Secondary | ICD-10-CM

## 2019-03-11 DIAGNOSIS — M25571 Pain in right ankle and joints of right foot: Secondary | ICD-10-CM | POA: Insufficient documentation

## 2019-03-11 DIAGNOSIS — W19XXXA Unspecified fall, initial encounter: Secondary | ICD-10-CM

## 2019-03-11 DIAGNOSIS — N938 Other specified abnormal uterine and vaginal bleeding: Secondary | ICD-10-CM | POA: Insufficient documentation

## 2019-03-11 DIAGNOSIS — E039 Hypothyroidism, unspecified: Secondary | ICD-10-CM | POA: Insufficient documentation

## 2019-03-11 DIAGNOSIS — O9989 Other specified diseases and conditions complicating pregnancy, childbirth and the puerperium: Principal | ICD-10-CM | POA: Insufficient documentation

## 2019-03-11 DIAGNOSIS — Z1159 Encounter for screening for other viral diseases: Secondary | ICD-10-CM | POA: Insufficient documentation

## 2019-03-11 DIAGNOSIS — E119 Type 2 diabetes mellitus without complications: Secondary | ICD-10-CM | POA: Insufficient documentation

## 2019-03-11 DIAGNOSIS — W182XXA Fall in (into) shower or empty bathtub, initial encounter: Secondary | ICD-10-CM | POA: Diagnosis not present

## 2019-03-11 DIAGNOSIS — Z3A34 34 weeks gestation of pregnancy: Secondary | ICD-10-CM | POA: Insufficient documentation

## 2019-03-11 DIAGNOSIS — S92901A Unspecified fracture of right foot, initial encounter for closed fracture: Secondary | ICD-10-CM

## 2019-03-11 DIAGNOSIS — O10913 Unspecified pre-existing hypertension complicating pregnancy, third trimester: Secondary | ICD-10-CM | POA: Diagnosis not present

## 2019-03-11 DIAGNOSIS — O4693 Antepartum hemorrhage, unspecified, third trimester: Secondary | ICD-10-CM | POA: Diagnosis not present

## 2019-03-11 DIAGNOSIS — Z3A35 35 weeks gestation of pregnancy: Secondary | ICD-10-CM | POA: Diagnosis not present

## 2019-03-11 DIAGNOSIS — O9A213 Injury, poisoning and certain other consequences of external causes complicating pregnancy, third trimester: Secondary | ICD-10-CM

## 2019-03-11 LAB — COMPREHENSIVE METABOLIC PANEL
ALT: 12 U/L (ref 0–44)
AST: 17 U/L (ref 15–41)
Albumin: 2.5 g/dL — ABNORMAL LOW (ref 3.5–5.0)
Alkaline Phosphatase: 112 U/L (ref 38–126)
Anion gap: 8 (ref 5–15)
BUN: 11 mg/dL (ref 6–20)
CO2: 21 mmol/L — ABNORMAL LOW (ref 22–32)
Calcium: 8.1 mg/dL — ABNORMAL LOW (ref 8.9–10.3)
Chloride: 104 mmol/L (ref 98–111)
Creatinine, Ser: 0.65 mg/dL (ref 0.44–1.00)
GFR calc Af Amer: >60 mL/min (ref >60)
GFR calc non Af Amer: >60 mL/min (ref >60)
Glucose, Bld: 86 mg/dL (ref 70–99)
Potassium: 3.8 mmol/L (ref 3.5–5.1)
Sodium: 133 mmol/L — ABNORMAL LOW (ref 135–145)
Total Bilirubin: 0.6 mg/dL (ref 0.3–1.2)
Total Protein: 5.8 g/dL — ABNORMAL LOW (ref 6.5–8.1)

## 2019-03-11 LAB — CBC
HCT: 31.2 % — ABNORMAL LOW (ref 36.0–46.0)
Hemoglobin: 10.1 g/dL — ABNORMAL LOW (ref 12.0–15.0)
MCH: 27.6 pg (ref 26.0–34.0)
MCHC: 32.4 g/dL (ref 30.0–36.0)
MCV: 85.2 fL (ref 80.0–100.0)
Platelets: 257 10*3/uL (ref 150–400)
RBC: 3.66 MIL/uL — ABNORMAL LOW (ref 3.87–5.11)
RDW: 13.7 % (ref 11.5–15.5)
WBC: 8.7 10*3/uL (ref 4.0–10.5)
nRBC: 0 % (ref 0.0–0.2)

## 2019-03-11 LAB — URINALYSIS, ROUTINE W REFLEX MICROSCOPIC
Bilirubin Urine: NEGATIVE
Glucose, UA: NEGATIVE mg/dL
Hgb urine dipstick: NEGATIVE
Ketones, ur: NEGATIVE mg/dL
Leukocytes,Ua: NEGATIVE
Nitrite: NEGATIVE
Protein, ur: NEGATIVE mg/dL
Specific Gravity, Urine: 1.012 (ref 1.005–1.030)
pH: 5 (ref 5.0–8.0)

## 2019-03-11 LAB — PROTEIN / CREATININE RATIO, URINE
Creatinine, Urine: 50.58 mg/dL
Total Protein, Urine: <6 mg/dL

## 2019-03-11 LAB — WET PREP, GENITAL
Clue Cells Wet Prep HPF POC: NONE SEEN
Sperm: NONE SEEN
Trich, Wet Prep: NONE SEEN

## 2019-03-11 LAB — SARS CORONAVIRUS 2 BY RT PCR (HOSPITAL ORDER, PERFORMED IN ~~LOC~~ HOSPITAL LAB): SARS Coronavirus 2: NEGATIVE

## 2019-03-11 MED ORDER — DOCUSATE SODIUM 100 MG PO CAPS: 100.0000 mg | ORAL_CAPSULE | Freq: Every day | ORAL | Status: DC

## 2019-03-11 MED ORDER — ZOLPIDEM TARTRATE 5 MG PO TABS: 5.0000 mg | ORAL_TABLET | Freq: Every evening | ORAL | Status: DC | PRN

## 2019-03-11 MED ORDER — CALCIUM CARBONATE ANTACID 500 MG PO CHEW
2.0000 | CHEWABLE_TABLET | ORAL | Status: DC | PRN
  Filled 2019-03-11: qty 2

## 2019-03-11 MED ORDER — ACETAMINOPHEN 325 MG PO TABS
650.0000 mg | ORAL_TABLET | ORAL | Status: DC | PRN
  Administered 2019-03-12: 650 mg via ORAL
  Filled 2019-03-11: qty 2

## 2019-03-11 MED ORDER — PRENATAL MULTIVITAMIN CH: 1.0000 | ORAL_TABLET | Freq: Every day | ORAL | Status: DC

## 2019-03-11 NOTE — MAU Note (Signed)
Covid swab collected. Pt tolerated well. Pt asymptomatic 

## 2019-03-11 NOTE — Progress Notes (Signed)
Pt requested to be taken off monitor so she can sit up to eat. Because of Obese abdomen it is difficult for pt to be monitored sitting up and she would like to sit up to eat.pt will call me as soon she is done eating and will be put back on monitor.

## 2019-03-11 NOTE — H&P (Addendum)
[name] is a 43 y.o. female presenting for vaginal bleeding  43 yo [id number] @ 34+6 with an EDC of 04/16/2019 changed by a 6 week Ultrasound presents to MAU complaining of vaginal bleeding. In MAU, the patient relayed the history of falling in the shower earlier in the day. After her fall she experienced vaginal bleeding. The patient had called our office early in the day complaining of vaginal bleeding but did not relay the history of the fall. In MAU, In addition to abdominal pain and vaginal bleeding, the patient expressed pain in her foot and she was noted to have swelling of her right ankle with bruising. X-rays show a probable fracture of the distal tibia. Given the complaint of vaginal bleeding and abdominal/pelvic pain in the context of a recent fall during which the force was great enough to result in a probable fracture, the decision was made to keep the patient for 23 hour obs. No active bleeding was noted on exam, however there was evidence of recent bleeding with old blood in the vault. The patient is a poor historian. While she does not endorse direct abdominal trauma the tangential relaying of the events of the fall with resultant bleeding made it prudent to proceed with 23 hr obs.  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 SHe has a history of cesarean delivery 20 years ago @ 26 weeks, in [place] with unknown uterine scar. She is being delivered at 37 weeks due to unknown scar. OB History    Gravida  3   Para  2   Term  1   Preterm  1   AB      Living  2     SAB      TAB      Ectopic      Multiple      Live Births  2          Past Medical History:  Diagnosis Date  . Carpal tunnel syndrome, bilateral   . Chronic hypertension   . Depression   . GERD (gastroesophageal reflux disease)   .  Gestational diabetes   . H. pylori infection    dx  09-02-2014-- treated w/ antibiotic  . Hypertension    recently low pressures   . Hypothyroid   . Irregular menstrual cycle   . Menorrhagia   . PCOS (polycystic ovarian syndrome)   . Type 2 diabetes mellitus ([place])    Past Surgical History:  Procedure Laterality Date  . CARPAL TUNNEL RELEASE Bilateral 10/07/2014   Procedure: CARPAL TUNNEL RELEASE BILATERAL;  Surgeon: [name], MD;  Location: [facility];  Service: Orthopedics;  Laterality: Bilateral;  . CESAREAN SECTION  1995  . GASTRIC ROUX-EN-Y N/A 03/12/2017   Procedure: LAPAROSCOPIC ROUX-EN-Y GASTRIC BYPASS WITH UPPER ENDOSCOPY;  Surgeon: Kinsinger, Arta Bruce, MD;  Location: [facility];  Service: General;  Laterality: N/A;  . UPPER GI ENDOSCOPY  03/12/2017   Procedure: UPPER GI ENDOSCOPY;  Surgeon: Kieth Brightly Arta Bruce, MD;  Location: [facility];  Service: General;;   Family History: family history includes Cancer (age of onset: 62) in her father; Diabetes in her father and mother; Heart disease (age of onset: 42) in her mother; Hypertension in her mother. Social History:  reports that she has never smoked. She has never used smokeless tobacco. She reports that she does not drink  alcohol or use drugs.     Maternal Diabetes: No Genetic Screening: Abnormal:  Results: Elevated risk of Trisomy 21 Maternal Ultrasounds/Referrals: Abnormal:  Findings:   Other: Fetal Ultrasounds or other Referrals:  Referred to Materal Fetal Medicine  Cystic Hygroma Maternal Substance Abuse:  No Significant Maternal Medications:  None Significant Maternal Lab Results:  Lab values include: Group B Strep positive Other Comments:  Poor historian  ROS History   Blood pressure (!) 147/67, pulse 63, temperature 98.1 F (36.7 C), temperature source Oral, resp. rate 16, height 5\' 2"  (1.575 m), weight 107 kg, last menstrual period 07/03/2018, SpO2 99 %. Exam Physical Exam  Prenatal  labs: ABO, Rh: --/--/O POS Performed at [facility], [address] [place], [place] [address]  [phone]) Antibody:  negative Rubella:  Immune RPR:   Reactive, Titer 1:2, TPA positive, History of syphilis at 17 HBsAg:   Neg HIV: Non Reactive (10/13 0852)  GBS:   Positive  Assessment/Plan: 1) Admit 2) 23 hr obs 3) Continuous monitoring 4) Probable tibia fracture. Ortho consult pending   Vanessa Kick 03/11/2019, 4:45 PM

## 2019-03-11 NOTE — MAU Note (Signed)
Pt presents to MAU with c/o fall that happened this morning around 0430. She began having bright red vaginal bleeding today around 1030, now has decreased. She also rolled right ankle during fall. Pt denies LOF and abdominal pain. +FM

## 2019-03-11 NOTE — MAU Provider Note (Signed)
History     CSN: [id number]  Arrival date and time: 03/11/19 1230   First Provider Initiated Contact with Patient 03/11/19 1352      Chief Complaint  Patient presents with  . Fall  . Vaginal Bleeding   HPI   Ms.[name] is a 43 y.o. female [phone] @ [redacted]w[redacted]d here in MAU with complaints of vaginal bleeding. She was in the bathroom this morning taking a shower and when she got out of the shower she slid and fell on her back. Around 10:30 Am She noticed bright red blood in her underwear. She is also having swelling and pain in her right ankle. She had one pain today however that went away.  She denies bleeding or spotting at present. Says when she used the restroom upon arrival to MAU she did not see the blood. She complains of pain in her right foot/ankle. Concerned about the amount of swelling in her foot. She is able to apply very minor pressure on that foot, however it is painful for her. + fetal movement.  History of essential HTN. Taking labetalol 100 mg daily. Her last dose was this morning around 11:00.   OB History    Gravida  3   Para  2   Term  1   Preterm  1   AB      Living  2     SAB      TAB      Ectopic      Multiple      Live Births  2           Past Medical History:  Diagnosis Date  . Carpal tunnel syndrome, bilateral   . Chronic hypertension   . Depression   . GERD (gastroesophageal reflux disease)   . Gestational diabetes   . H. pylori infection    dx  09-02-2014-- treated w/ antibiotic  . Hypertension    recently low pressures   . Hypothyroid   . Irregular menstrual cycle   . Menorrhagia   . PCOS (polycystic ovarian syndrome)   . Type 2 diabetes mellitus ([place])     Past Surgical History:  Procedure Laterality Date  . CARPAL TUNNEL RELEASE Bilateral 10/07/2014   Procedure: CARPAL TUNNEL RELEASE BILATERAL;  Surgeon: [name], MD;  [facility];  Service: Orthopedics;  Laterality: Bilateral;  .  CESAREAN SECTION  1995  . GASTRIC ROUX-EN-Y N/A 03/12/2017   Procedure: LAPAROSCOPIC ROUX-EN-Y GASTRIC BYPASS WITH UPPER ENDOSCOPY;  Surgeon: Kieth Brightly, Arta Bruce, MD;  Location: [facility];  Service: General;  Laterality: N/A;  . UPPER GI ENDOSCOPY  03/12/2017   Procedure: UPPER GI ENDOSCOPY;  Surgeon: Kieth Brightly Arta Bruce, MD;  Location: [facility];  Service: General;;    Family History  Problem Relation Age of Onset  . Heart disease Mother 22       CAD  . Diabetes Mother   . Hypertension Mother   . Cancer Father 66       prostate cancer  . Diabetes Father   . Thyroid disease Neg Hx     Social History   Tobacco Use  . Smoking status: Never Smoker  . Smokeless tobacco: Never Used  Substance Use Topics  . Alcohol use: Never    Alcohol/week: 0.0 standard drinks    Frequency: Never  . Drug use: No    Allergies:  Allergies  Allergen Reactions  . Food     RAW/FRESH FRUIT--PEACHES, PEARS, APPLES=THROAT CLOSES/SWELLING &  COUGHING SHORTNESS OF BREATH  . 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 Shrimp [Shellfish Allergy] Anaphylaxis    Medications Prior to Admission  Medication Sig Dispense Refill Last Dose  . aspirin 81 MG chewable tablet Chew by mouth daily.   Taking  . CALCIUM PO Take by mouth.   Taking  . FLUoxetine (PROZAC) 20 MG tablet Take 1 tablet (20 mg total) by mouth daily. 90 tablet 1 Taking  . fluticasone (FLONASE) 50 MCG/ACT nasal spray Place 2 sprays into both nostrils daily. 16 g 6 Taking  . glucose blood (FREESTYLE TEST STRIPS) test strip Please fill with preferred strip and check sugar once daily 100 each 4 Taking  . hydrocortisone (ANUSOL-HC) 25 MG suppository Place 1 suppository (25 mg total) rectally 2 (two) times daily. 12 suppository 0 Taking  . labetalol (NORMODYNE) 100 MG tablet Take 1 tablet (100 mg total) by mouth 2 (two) times daily. 60 tablet 0 Taking  . levothyroxine (SYNTHROID, LEVOTHROID) 200 MCG tablet Take 1 tablet (200 mcg total) by mouth daily before  breakfast. 30 tablet 2 Taking  . loratadine (CLARITIN) 10 MG tablet Take 1 tablet (10 mg total) by mouth daily. 90 tablet 3 Taking  . Multiple Vitamin (MULTIVITAMIN) tablet Take 1 tablet by mouth daily.   Taking  . nystatin (NYSTATIN) powder Apply 1 g topically 2 (two) times daily. 56 g 2 Taking  . nystatin cream (MYCOSTATIN) Apply 1 application topically 2 (two) times daily. 30 g 5 Taking  . pantoprazole (PROTONIX) 40 MG tablet Take 1 tablet (40 mg total) by mouth daily. 30 tablet 1 Taking   Results for orders placed or performed during the hospital encounter of 03/11/19 (from the past 48 hour(s))  Urinalysis, Routine w reflex microscopic     Status: None   Collection Time: 03/11/19  1:13 PM  Result Value Ref Range   Color, Urine YELLOW YELLOW   APPearance CLEAR CLEAR   Specific Gravity, Urine 1.012 1.005 - 1.030   pH 5.0 5.0 - 8.0   Glucose, UA NEGATIVE NEGATIVE mg/dL   Hgb urine dipstick NEGATIVE NEGATIVE   Bilirubin Urine NEGATIVE NEGATIVE   Ketones, ur NEGATIVE NEGATIVE mg/dL   Protein, ur NEGATIVE NEGATIVE mg/dL   Nitrite NEGATIVE NEGATIVE   Leukocytes,Ua NEGATIVE NEGATIVE    Comment: Performed at [facility] [address] [place], [place] [address]  Protein / creatinine ratio, urine     Status: None   Collection Time: 03/11/19  1:13 PM  Result Value Ref Range   Creatinine, Urine 50.58 mg/dL   Total Protein, Urine <6 mg/dL   Protein Creatinine Ratio        0.00 - 0.15 mg/mg[Cre]    Comment: RESULT BELOW REPORTABLE RANGE, UNABLE TO CALCULATE. Performed at [facility], [place] [address], [place], [place] [address]   CBC     Status: Abnormal   Collection Time: 03/11/19  2:04 PM  Result Value Ref Range   WBC 8.7 4.0 - 10.5 K/uL   RBC 3.66 (L) 3.87 - 5.11 MIL/uL   Hemoglobin 10.1 (L) 12.0 - 15.0 g/dL   HCT 31.2 (L) 36.0 - 46.0 %   MCV 85.2 80.0 - 100.0 fL   MCH 27.6 26.0 - 34.0 pg   MCHC 32.4 30.0 - 36.0 g/dL   RDW 13.7 11.5 - 15.5 %   Platelets 257 150 -  400 K/uL   nRBC 0.0 0.0 - 0.2 %    Comment: Performed at [facility],  [address], [place], [place] [address]  Comprehensive metabolic panel     Status: Abnormal   Collection Time: 03/11/19  2:04 PM  Result Value Ref Range   Sodium 133 (L) 135 - 145 mmol/L   Potassium 3.8 3.5 - 5.1 mmol/L   Chloride 104 98 - 111 mmol/L   CO2 21 (L) 22 - 32 mmol/L   Glucose, Bld 86 70 - 99 mg/dL   BUN 11 6 - 20 mg/dL   Creatinine, Ser 0.65 0.44 - 1.00 mg/dL   Calcium 8.1 (L) 8.9 - 10.3 mg/dL   Total Protein 5.8 (L) 6.5 - 8.1 g/dL   Albumin 2.5 (L) 3.5 - 5.0 g/dL   AST 17 15 - 41 U/L   ALT 12 0 - 44 U/L   Alkaline Phosphatase 112 38 - 126 U/L   Total Bilirubin 0.6 0.3 - 1.2 mg/dL   GFR calc non Af Amer >60 >60 mL/min   GFR calc Af Amer >60 >60 mL/min   Anion gap 8 5 - 15    Comment: Performed at [facility] [address], [place], [place] [address]  Wet prep, genital     Status: Abnormal   Collection Time: 03/11/19  3:46 PM  Result Value Ref Range   Yeast Wet Prep HPF POC PRESENT (A) NONE SEEN   Trich, Wet Prep NONE SEEN NONE SEEN   Clue Cells Wet Prep HPF POC NONE SEEN NONE SEEN   WBC, Wet Prep HPF POC MANY (A) NONE SEEN    Comment: MANY BACTERIA SEEN   Sperm NONE SEEN     Comment: Performed at [facility], [place] [address], [place], [place] [address]   Dg Ankle Complete Right  Result Date: 03/11/2019 CLINICAL DATA:  Fall today.  Right ankle pain EXAM: RIGHT ANKLE - COMPLETE 3+ VIEW COMPARISON:  None. FINDINGS: Mild anterior subluxation of the talus relative to the tibia. Probable fracture the distal tibia anteriorly on the lateral view. No fracture of the fibula. Diffuse soft tissue swelling IMPRESSION: Mild ankle subluxation with probable fracture distal tibia anteriorly. Recommend CT ankle for further evaluation Electronically Signed   By: Franchot Gallo M.D.   On: 03/11/2019 14:43   Review of Systems  Constitutional: Negative for fever.  Eyes: Negative for  photophobia.  Gastrointestinal: Negative for abdominal pain, nausea and vomiting.  Genitourinary: Positive for vaginal bleeding and vaginal discharge. Negative for pelvic pain.  Musculoskeletal: Positive for gait problem.  Neurological: Negative for dizziness and headaches.   Physical Exam   Blood pressure (!) 156/67, pulse (!) 55, temperature 98.1 F (36.7 C), temperature source Oral, resp. rate 16, height 5\' 2"  (1.575 m), weight 107 kg, last menstrual period 07/03/2018, SpO2 99 %.  Patient Vitals for the past 24 hrs:  BP Temp Temp src Pulse Resp SpO2 Height Weight  03/11/19 1827 (!) 149/72 - - (!) 57 - - - -  03/11/19 1805 (!) 161/71 - - 60 - - - -  03/11/19 1804 (!) 163/67 - - (!) 58 - - - -  03/11/19 1630 (!) 148/65 - - (!) 56 - - - -  03/11/19 1615 (!) 147/67 - - 63 - - - -  03/11/19 1600 (!) 156/77 - - 62 - - - -  03/11/19 1546 (!) 159/69 - - 61 - - - -  03/11/19 1531 (!) 149/67 - - (!) 57 - - - -  03/11/19 1516 (!) 156/67 - - (!) 55 - - - -  03/11/19 1501 (!) 146/71 - - (!) 57 - - - -  03/11/19 1457 (!) 164/62 - - (!) 58 - - - -  03/11/19 1400 (!) 113/47 - - (!) 59 - - - -  03/11/19 1351 (!) 113/58 - - 60 - - - -  03/11/19 1316 (!) 141/76 98.1 F (36.7 C) Oral 66 16 99 % - -  03/11/19 1312 - - - - - - 5\' 2"  (1.575 m) 107 kg   Physical Exam  Constitutional: She is oriented to person, place, and time. She appears well-developed and well-nourished. No distress.  Respiratory: Effort normal.  GI: Soft. She exhibits no distension. There is no abdominal tenderness. There is no rebound and no guarding.  Genitourinary:    Genitourinary Comments: Vagina - Small amount of pale yellow/ brown vaginal discharge, no odor  Cervix - No contact bleeding, no active bleeding  Bimanual exam: Deferred  GC/Chlam, wet prep done Chaperone present for exam.    Musculoskeletal:     Right ankle: She exhibits decreased range of motion, swelling and ecchymosis. She exhibits normal pulse.        Feet:  Neurological: She is alert and oriented to person, place, and time.  Skin: Skin is warm. She is not diaphoretic.  Psychiatric: Her behavior is normal.   Fetal Tracing: Baseline: 130 bpm Variability: Moderate  Accelerations: 15x15 Decelerations: None Toco: None  MAU Course  Procedures  None  MDM  PIH labs collected Xray of right foot shows tibial fracture. Attempted to call Ortho, received a phone call back from triage stating a call was made for consult already. Relayed this message to Dr. Harrington Challenger.  Spoke to NICU Dr. Higinio Roger who is ok with admission.  Discussed admission with the patient who appeared perplexed about why she was being admitted. Explained that her fall was significant and it was ideal to keep her overnight for fetal monitoring and ortho consult.  Dr. Harrington Challenger to enter admission orders.  Assessment and Plan   A:  1. Closed fracture of right foot, initial encounter   2. Fall   3. Right ankle pain   4. Chronic hypertension affecting pregnancy   5. Fall, initial encounter   6. [redacted] weeks gestation of pregnancy   7. Vaginal bleeding in pregnancy, third trimester      P:  Admit to [facility] speciality  Ortho consult Nicu cleared admission   Rasch, Artist Pais, NP 03/11/2019 6:45 PM

## 2019-03-11 NOTE — Progress Notes (Signed)
Pt complained of gas pain to left upper quadrant around 2230 after eating some quesadillas, pt was repositioned and pt burped about 10 times and passed some flatus. Pt states she feels better. Denied pain as being contraction pains and denied cramping. Will continue to monitor closely.

## 2019-03-12 LAB — GC/CHLAMYDIA PROBE AMP (~~LOC~~) NOT AT ARMC
Chlamydia: NEGATIVE
Neisseria Gonorrhea: NEGATIVE

## 2019-03-12 MED ORDER — LEVOTHYROXINE SODIUM 25 MCG PO TABS: 200.0000 ug | ORAL_TABLET | Freq: Every day | ORAL | Status: DC

## 2019-03-12 MED ORDER — FLUOXETINE HCL 10 MG PO CAPS: 20.0000 mg | ORAL_CAPSULE | Freq: Every day | ORAL | Status: DC

## 2019-03-12 MED ORDER — LABETALOL HCL 100 MG PO TABS: 100.0000 mg | ORAL_TABLET | Freq: Two times a day (BID) | ORAL | Status: DC

## 2019-03-12 NOTE — Plan of Care (Signed)
  Problem: Education: Goal: Knowledge of disease or condition will improve Outcome: Progressing   Problem: Education: Goal: Knowledge of the prescribed therapeutic regimen will improve Outcome: Progressing   Problem: Education: Goal: Individualized Educational Video(s) Outcome: Progressing   Problem: Clinical Measurements: Goal: Complications related to the disease process, condition or treatment will be avoided or minimized Outcome: Progressing   Problem: Education: Goal: Knowledge of General Education information will improve Description Including pain rating scale, medication(s)/side effects and non-pharmacologic comfort measures Outcome: Progressing

## 2019-03-12 NOTE — Progress Notes (Addendum)
43 y.o. [id number] [redacted]w[redacted]d HD#0 admitted for Bleeding Rt ankle pn.  Patient seen in maternity admissions unit yesterday afternoon for vaginal bleeding following a fall in the shower at 0330 on 5/13.  She denies direct abdominal trauma. Brown discharge noted on exam at presentation, but no active bleeding in MAU.  Given complaint of bleeding at home, she was admitted for observation.  She denies contractions or cramping.  Denies any further bleeding or brown discharge.  Reports good fetal movement.  She had an xray that notes a probable distal right tibia fracture.  She is able to ambulate, but with significant pain.      Vitals:   03/11/19 1827 03/11/19 2309 03/12/19 0344 03/12/19 0803  BP: (!) 149/72 105/67 (!) 133/58 (!) 149/72  Pulse: (!) 57 60 64 73  Resp:   18 18  Temp:  98 F (36.7 C) 98.5 F (36.9 C) 97.9 F (36.6 C)  TempSrc:   Oral Oral  SpO2:   100% 98%  Weight:      Height:        NAD Abdomen soft, gravid, no tenderness FHTs 120s, moderate variability.  + accelerations.  There was 1 variable deceleration at 2300 that resolved with repositioning.  Since that time, variability has remained moderate with many periods of reactivity.  Category 1 Toco: quiet  Right ankle swollen > left with bruising on lateral aspect.  Foot is warm to touch.  Results for orders placed or performed during the hospital encounter of 03/11/19 (from the past 24 hour(s))  Urinalysis, Routine w reflex microscopic     Status: None   Collection Time: 03/11/19  1:13 PM  Result Value Ref Range   Color, Urine YELLOW YELLOW   APPearance CLEAR CLEAR   Specific Gravity, Urine 1.012 1.005 - 1.030   pH 5.0 5.0 - 8.0   Glucose, UA NEGATIVE NEGATIVE mg/dL   Hgb urine dipstick NEGATIVE NEGATIVE   Bilirubin Urine NEGATIVE NEGATIVE   Ketones, ur NEGATIVE NEGATIVE mg/dL   Protein, ur NEGATIVE NEGATIVE mg/dL   Nitrite NEGATIVE NEGATIVE   Leukocytes,Ua NEGATIVE NEGATIVE  Protein / creatinine ratio, urine      Status: None   Collection Time: 03/11/19  1:13 PM  Result Value Ref Range   Creatinine, Urine 50.58 mg/dL   Total Protein, Urine <6 mg/dL   Protein Creatinine Ratio        0.00 - 0.15 mg/mg[Cre]  CBC     Status: Abnormal   Collection Time: 03/11/19  2:04 PM  Result Value Ref Range   WBC 8.7 4.0 - 10.5 K/uL   RBC 3.66 (L) 3.87 - 5.11 MIL/uL   Hemoglobin 10.1 (L) 12.0 - 15.0 g/dL   HCT 31.2 (L) 36.0 - 46.0 %   MCV 85.2 80.0 - 100.0 fL   MCH 27.6 26.0 - 34.0 pg   MCHC 32.4 30.0 - 36.0 g/dL   RDW 13.7 11.5 - 15.5 %   Platelets 257 150 - 400 K/uL   nRBC 0.0 0.0 - 0.2 %  Comprehensive metabolic panel     Status: Abnormal   Collection Time: 03/11/19  2:04 PM  Result Value Ref Range   Sodium 133 (L) 135 - 145 mmol/L   Potassium 3.8 3.5 - 5.1 mmol/L   Chloride 104 98 - 111 mmol/L   CO2 21 (L) 22 - 32 mmol/L   Glucose, Bld 86 70 - 99 mg/dL   BUN 11 6 - 20 mg/dL   Creatinine, Ser 0.65 0.44 -  1.00 mg/dL   Calcium 8.1 (L) 8.9 - 10.3 mg/dL   Total Protein 5.8 (L) 6.5 - 8.1 g/dL   Albumin 2.5 (L) 3.5 - 5.0 g/dL   AST 17 15 - 41 U/L   ALT 12 0 - 44 U/L   Alkaline Phosphatase 112 38 - 126 U/L   Total Bilirubin 0.6 0.3 - 1.2 mg/dL   GFR calc non Af Amer >60 >60 mL/min   GFR calc Af Amer >60 >60 mL/min   Anion gap 8 5 - 15  Wet prep, genital     Status: Abnormal   Collection Time: 03/11/19  3:46 PM  Result Value Ref Range   Yeast Wet Prep HPF POC PRESENT (A) NONE SEEN   Trich, Wet Prep NONE SEEN NONE SEEN   Clue Cells Wet Prep HPF POC NONE SEEN NONE SEEN   WBC, Wet Prep HPF POC MANY (A) NONE SEEN   Sperm NONE SEEN   SARS Coronavirus 2 (CEPHEID - Performed in [place] hospital lab), Hosp Order     Status: None   Collection Time: 03/11/19  5:04 PM  Result Value Ref Range   SARS Coronavirus 2 NEGATIVE NEGATIVE    A: HD#1 with fall, probable distal right tibia fracture  P: > 24 hours since fall.  No contractions on toco or appreciated by patient.  No further vaginal bleeding.   Will discharge to home.  Has routine MFM [place] scheduled tomorrow.  Precautions reviewed.    Possible distal right tibia fracture:  TC to [facility] orthopedics: patient ok for outpatient management.  Appointment scheduled for this afternoon with Dr. Gladstone Lighter at 1415.  Information relayed to patient prior to discharge.    [place]

## 2019-03-12 NOTE — Discharge Instructions (Signed)
Call for regular painful contractions, vaginal bleeding, severe abdominal pain, decreased fetal movement or any other concerns.

## 2019-03-12 NOTE — Discharge Summary (Signed)
Obstetric Discharge Summary Reason for Admission: observation/evaluation Prenatal Procedures: NST 43 yo [id number] @ [redacted]w[redacted]d presented to MAU yesterday with complaints of vaginal bleeding after a fall in the shower earlier that AM.  On exam, fetal status was reassuring.  There was no evidence of active bleeding on exam, however there was old blood in the vaginal vault.  The patient also expressed pain in her foot and she was noted to have swelling of her right ankle with bruising. X-rays show a probable fracture of the distal tibia.   She was admitted for 24 hour observation.  On HD#1, endorsed excellent fetal movement.  Denies abdominal pain or contractions.  Denied any additional vaginal bleeding.  Fetal monitoring demonstrated 1 variable deceleration yesterday evening, but otherwise was reactive tracing with moderate variability throughout and no contractions appreciated.   TC with [employer].  Films reviewed by Dr. Gladstone Lighter, who recommends outpatient evaluation.  Appointment scheduled 5/14 at 1415 (day of discharge).   Hemoglobin  Date Value Ref Range Status  03/11/2019 10.1 (L) 12.0 - 15.0 g/dL Final  04/30/2018 12.5 11.1 - 15.9 g/dL Final   HCT  Date Value Ref Range Status  03/11/2019 31.2 (L) 36.0 - 46.0 % Final   Hematocrit  Date Value Ref Range Status  04/30/2018 38.1 34.0 - 46.6 % Final    Physical Exam:  General: alert and cooperative Uterus:  Soft, Non-tender Right ankles swelling with bruising and tenderness   Discharge Diagnoses: 35 week singleton IUP; possible right tibia fracture  Discharge Information: Date: 03/12/2019  Instructions: refer to practice specific booklet and call for decreased fetal movement, vaginal bleeding, contractions or any other concerns Discharge to: home Follow-up Information    Bobbye Charleston, MD Follow up on 03/18/2019.   Specialty:  Obstetrics and Gynecology Why:  Keep next scheduled appointment Contact information: [address] [place] [address] [phone]        Latanya Maudlin, MD Follow up on 03/12/2019.   Specialty:  Orthopedic Surgery Why:  at 215 pm Contact information: [address] [place] [address] [phone]             [place] 03/12/2019, 10:01 AM

## 2019-03-13 ENCOUNTER — Ambulatory Visit: Payer: Medicaid Other | Admitting: Endocrinology

## 2019-03-16 ENCOUNTER — Encounter (HOSPITAL_COMMUNITY): Payer: Self-pay | Admitting: *Deleted

## 2019-03-16 NOTE — Patient Instructions (Signed)
[name]  03/16/2019   Your procedure is scheduled on:  03/27/2019  Arrive at 77 at [place] on [address] at [facility]  and [employer]. You are invited to use the FREE valet parking or use the Visitor's parking deck.  Pick up the phone at the desk and dial [phone].  Call this number if you have problems the morning of surgery: [phone]  Remember:   Do not eat food:(After Midnight) Desps de medianoche.  Do not drink clear liquids: (After Midnight) Desps de medianoche.  Take these medicines the morning of surgery with A SIP OF WATER:  Labetalol, levothyroxine as prescribed   Do not wear jewelry, make-up or nail polish.  Do not wear lotions, powders, or perfumes. Do not wear deodorant.  Do not shave 48 hours prior to surgery.  Do not bring valuables to the hospital.  [facility] is not   responsible for any belongings or valuables brought to the hospital.  Contacts, dentures or bridgework may not be worn into surgery.  Leave suitcase in the car. After surgery it may be brought to your room.  For patients admitted to the hospital, checkout time is 11:00 AM the day of              discharge.      Please read over the following fact sheets that you were given:     Preparing for Surgery

## 2019-03-25 ENCOUNTER — Other Ambulatory Visit (HOSPITAL_COMMUNITY)
Admission: RE | Admit: 2019-03-25 | Discharge: 2019-03-25 | Disposition: A | Discharge status: Home / Self Care | Payer: Medicaid Other | Source: Ambulatory Visit | Attending: Obstetrics | Admitting: Obstetrics

## 2019-03-25 ENCOUNTER — Other Ambulatory Visit: Payer: Self-pay

## 2019-03-25 DIAGNOSIS — Z1159 Encounter for screening for other viral diseases: Secondary | ICD-10-CM | POA: Insufficient documentation

## 2019-03-25 NOTE — MAU Note (Signed)
Covid swabbed. Pt tolerated well. Pt asymptomatic

## 2019-03-26 LAB — NOVEL CORONAVIRUS, NAA (HOSP ORDER, SEND-OUT TO REF LAB; TAT 18-24 HRS): SARS-CoV-2, NAA: NOT DETECTED
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
## 2019-03-27 ENCOUNTER — Encounter (HOSPITAL_COMMUNITY)
Admission: RE | Disposition: A | Discharge status: Home / Self Care | Payer: Self-pay | Source: Home / Self Care | Attending: Obstetrics and Gynecology

## 2019-03-27 ENCOUNTER — Other Ambulatory Visit: Payer: Self-pay

## 2019-03-27 ENCOUNTER — Inpatient Hospital Stay (HOSPITAL_COMMUNITY): Payer: Medicaid Other | Admitting: Anesthesiology

## 2019-03-27 ENCOUNTER — Encounter (HOSPITAL_COMMUNITY): Payer: Self-pay | Admitting: *Deleted

## 2019-03-27 ENCOUNTER — Inpatient Hospital Stay (HOSPITAL_COMMUNITY)
Admission: RE | Admit: 2019-03-27 | Discharge: 2019-03-31 | DRG: 784 | Disposition: A | Discharge status: Home / Self Care | Payer: Medicaid Other | Attending: Obstetrics and Gynecology | Admitting: Obstetrics and Gynecology

## 2019-03-27 DIAGNOSIS — O99844 Bariatric surgery status complicating childbirth: Secondary | ICD-10-CM | POA: Diagnosis present

## 2019-03-27 DIAGNOSIS — O34211 Maternal care for low transverse scar from previous cesarean delivery: Principal | ICD-10-CM | POA: Diagnosis present

## 2019-03-27 DIAGNOSIS — O1002 Pre-existing essential hypertension complicating childbirth: Secondary | ICD-10-CM | POA: Diagnosis present

## 2019-03-27 DIAGNOSIS — O99214 Obesity complicating childbirth: Secondary | ICD-10-CM | POA: Diagnosis present

## 2019-03-27 DIAGNOSIS — E039 Hypothyroidism, unspecified: Secondary | ICD-10-CM | POA: Diagnosis present

## 2019-03-27 DIAGNOSIS — O99284 Endocrine, nutritional and metabolic diseases complicating childbirth: Secondary | ICD-10-CM | POA: Diagnosis present

## 2019-03-27 DIAGNOSIS — O99824 Streptococcus B carrier state complicating childbirth: Secondary | ICD-10-CM | POA: Diagnosis present

## 2019-03-27 DIAGNOSIS — Z9889 Other specified postprocedural states: Secondary | ICD-10-CM

## 2019-03-27 DIAGNOSIS — F419 Anxiety disorder, unspecified: Secondary | ICD-10-CM | POA: Diagnosis present

## 2019-03-27 DIAGNOSIS — Z3A37 37 weeks gestation of pregnancy: Secondary | ICD-10-CM | POA: Diagnosis not present

## 2019-03-27 DIAGNOSIS — O99344 Other mental disorders complicating childbirth: Secondary | ICD-10-CM | POA: Diagnosis present

## 2019-03-27 DIAGNOSIS — Z302 Encounter for sterilization: Secondary | ICD-10-CM

## 2019-03-27 HISTORY — PX: TUBAL LIGATION: SHX77

## 2019-03-27 LAB — CBC
HCT: 35.8 % — ABNORMAL LOW (ref 36.0–46.0)
Hemoglobin: 11.6 g/dL — ABNORMAL LOW (ref 12.0–15.0)
MCH: 27.6 pg (ref 26.0–34.0)
MCHC: 32.4 g/dL (ref 30.0–36.0)
MCV: 85 fL (ref 80.0–100.0)
Platelets: 286 10*3/uL (ref 150–400)
RBC: 4.21 MIL/uL (ref 3.87–5.11)
RDW: 14.5 % (ref 11.5–15.5)
WBC: 8.2 10*3/uL (ref 4.0–10.5)
nRBC: 0 % (ref 0.0–0.2)

## 2019-03-27 LAB — GLUCOSE, CAPILLARY: Glucose-Capillary: 84 mg/dL (ref 70–99)

## 2019-03-27 LAB — PREPARE RBC (CROSSMATCH)

## 2019-03-27 LAB — ABO/RH: ABO/RH(D): O POS

## 2019-03-27 SURGERY — Surgical Case
Anesthesia: Spinal | Wound class: Clean Contaminated

## 2019-03-27 MED ORDER — WITCH HAZEL-GLYCERIN EX PADS: 1.0000 "application " | MEDICATED_PAD | CUTANEOUS | Status: DC | PRN

## 2019-03-27 MED ORDER — SIMETHICONE 80 MG PO CHEW
80.0000 mg | CHEWABLE_TABLET | ORAL | Status: DC
  Administered 2019-03-27 – 2019-03-30 (×4): 80 mg via ORAL
  Filled 2019-03-27 (×4): qty 1

## 2019-03-27 MED ORDER — ENOXAPARIN SODIUM 60 MG/0.6ML ~~LOC~~ SOLN
0.5000 mg/kg | SUBCUTANEOUS | Status: DC
  Administered 2019-03-28 – 2019-03-31 (×4): 55 mg via SUBCUTANEOUS
  Filled 2019-03-27 (×4): qty 0.6

## 2019-03-27 MED ORDER — PRENATAL MULTIVITAMIN CH
1.0000 | ORAL_TABLET | Freq: Every day | ORAL | Status: DC
  Administered 2019-03-27 – 2019-03-30 (×4): 1 via ORAL
  Filled 2019-03-27 (×4): qty 1

## 2019-03-27 MED ORDER — DEXAMETHASONE SODIUM PHOSPHATE 4 MG/ML IJ SOLN
INTRAMUSCULAR | Status: AC
  Filled 2019-03-27: qty 7

## 2019-03-27 MED ORDER — ONDANSETRON HCL 4 MG/2ML IJ SOLN
INTRAMUSCULAR | Status: DC | PRN
  Administered 2019-03-27: 4 mg via INTRAVENOUS

## 2019-03-27 MED ORDER — MORPHINE SULFATE (PF) 0.5 MG/ML IJ SOLN
INTRAMUSCULAR | Status: DC | PRN
  Administered 2019-03-27: .15 ug via INTRATHECAL

## 2019-03-27 MED ORDER — MAGNESIUM SULFATE BOLUS VIA INFUSION
4.0000 g | Freq: Once | INTRAVENOUS | Status: AC
  Administered 2019-03-27: 4 g via INTRAVENOUS
  Filled 2019-03-27: qty 500

## 2019-03-27 MED ORDER — TETANUS-DIPHTH-ACELL PERTUSSIS 5-2.5-18.5 LF-MCG/0.5 IM SUSP: 0.5000 mL | Freq: Once | INTRAMUSCULAR | Status: DC

## 2019-03-27 MED ORDER — SCOPOLAMINE 1 MG/3DAYS TD PT72
MEDICATED_PATCH | TRANSDERMAL | Status: AC
  Filled 2019-03-27: qty 1

## 2019-03-27 MED ORDER — MORPHINE SULFATE (PF) 0.5 MG/ML IJ SOLN
INTRAMUSCULAR | Status: AC
  Filled 2019-03-27: qty 10

## 2019-03-27 MED ORDER — LABETALOL HCL 5 MG/ML IV SOLN: 20.0000 mg | INTRAVENOUS | Status: DC | PRN

## 2019-03-27 MED ORDER — LEVOTHYROXINE SODIUM 200 MCG PO TABS
200.0000 ug | ORAL_TABLET | Freq: Every day | ORAL | Status: DC
  Administered 2019-03-28 – 2019-03-31 (×4): 200 ug via ORAL
  Filled 2019-03-27 (×5): qty 1

## 2019-03-27 MED ORDER — LABETALOL HCL 100 MG PO TABS
100.0000 mg | ORAL_TABLET | Freq: Two times a day (BID) | ORAL | Status: DC
  Administered 2019-03-27 – 2019-03-29 (×5): 100 mg via ORAL
  Filled 2019-03-27 (×6): qty 1

## 2019-03-27 MED ORDER — FLEET ENEMA 7-19 GM/118ML RE ENEM: 1.0000 | ENEMA | Freq: Every day | RECTAL | Status: DC | PRN

## 2019-03-27 MED ORDER — HYDRALAZINE HCL 20 MG/ML IJ SOLN
INTRAMUSCULAR | Status: AC
  Filled 2019-03-27: qty 1

## 2019-03-27 MED ORDER — METOCLOPRAMIDE HCL 5 MG/ML IJ SOLN
INTRAMUSCULAR | Status: DC | PRN
  Administered 2019-03-27: 10 mg via INTRAVENOUS

## 2019-03-27 MED ORDER — CEFAZOLIN SODIUM-DEXTROSE 2-4 GM/100ML-% IV SOLN
INTRAVENOUS | Status: AC
  Filled 2019-03-27: qty 100

## 2019-03-27 MED ORDER — MENTHOL 3 MG MT LOZG: 1.0000 | LOZENGE | OROMUCOSAL | Status: DC | PRN

## 2019-03-27 MED ORDER — SODIUM CHLORIDE 0.9 % IV SOLN
INTRAVENOUS | Status: DC | PRN
  Administered 2019-03-27: 08:00:00 via INTRAVENOUS

## 2019-03-27 MED ORDER — BISACODYL 10 MG RE SUPP: 10.0000 mg | Freq: Every day | RECTAL | Status: DC | PRN

## 2019-03-27 MED ORDER — OXYTOCIN 40 UNITS IN NORMAL SALINE INFUSION - SIMPLE MED: INTRAVENOUS | Status: DC | PRN

## 2019-03-27 MED ORDER — SODIUM CHLORIDE 0.9 % IR SOLN
Status: DC | PRN
  Administered 2019-03-27: 1

## 2019-03-27 MED ORDER — HYDRALAZINE HCL 20 MG/ML IJ SOLN
5.0000 mg | INTRAMUSCULAR | Status: DC | PRN
  Administered 2019-03-27 – 2019-03-30 (×2): 5 mg via INTRAVENOUS

## 2019-03-27 MED ORDER — BUPIVACAINE IN DEXTROSE 0.75-8.25 % IT SOLN
INTRATHECAL | Status: DC | PRN
  Administered 2019-03-27: 1.8 mL via INTRATHECAL

## 2019-03-27 MED ORDER — MAGNESIUM SULFATE 40 G IN LACTATED RINGERS - SIMPLE
2.0000 g/h | INTRAVENOUS | Status: DC
  Administered 2019-03-27 – 2019-03-28 (×3): 2 g/h via INTRAVENOUS
  Filled 2019-03-27 (×4): qty 500

## 2019-03-27 MED ORDER — SIMETHICONE 80 MG PO CHEW
80.0000 mg | CHEWABLE_TABLET | Freq: Three times a day (TID) | ORAL | Status: DC
  Administered 2019-03-27 – 2019-03-31 (×12): 80 mg via ORAL
  Filled 2019-03-27 (×12): qty 1

## 2019-03-27 MED ORDER — ACETAMINOPHEN 10 MG/ML IV SOLN
INTRAVENOUS | Status: AC
  Filled 2019-03-27: qty 100

## 2019-03-27 MED ORDER — FERROUS SULFATE 325 (65 FE) MG PO TABS
325.0000 mg | ORAL_TABLET | Freq: Two times a day (BID) | ORAL | Status: DC
  Administered 2019-03-27 – 2019-03-31 (×8): 325 mg via ORAL
  Filled 2019-03-27 (×8): qty 1

## 2019-03-27 MED ORDER — LABETALOL HCL 100 MG PO TABS: 100.0000 mg | ORAL_TABLET | Freq: Every day | ORAL | Status: DC

## 2019-03-27 MED ORDER — OXYTOCIN 10 UNIT/ML IJ SOLN
INTRAMUSCULAR | Status: DC | PRN
  Administered 2019-03-27: 40 [IU]

## 2019-03-27 MED ORDER — DEXTROSE 5 % IV SOLN
INTRAVENOUS | Status: AC
  Filled 2019-03-27: qty 3000

## 2019-03-27 MED ORDER — ZOLPIDEM TARTRATE 5 MG PO TABS: 5.0000 mg | ORAL_TABLET | Freq: Every evening | ORAL | Status: DC | PRN

## 2019-03-27 MED ORDER — DEXAMETHASONE SODIUM PHOSPHATE 10 MG/ML IJ SOLN
INTRAMUSCULAR | Status: DC | PRN
  Administered 2019-03-27: 4 mg via INTRAVENOUS

## 2019-03-27 MED ORDER — NYSTATIN 100000 UNIT/GM EX POWD
1.0000 g | Freq: Three times a day (TID) | CUTANEOUS | Status: DC | PRN
  Filled 2019-03-27: qty 15

## 2019-03-27 MED ORDER — OXYTOCIN 40 UNITS IN NORMAL SALINE INFUSION - SIMPLE MED
INTRAVENOUS | Status: AC
  Filled 2019-03-27: qty 1000

## 2019-03-27 MED ORDER — METOCLOPRAMIDE HCL 5 MG/ML IJ SOLN
INTRAMUSCULAR | Status: AC
  Filled 2019-03-27: qty 2

## 2019-03-27 MED ORDER — ACETAMINOPHEN 10 MG/ML IV SOLN
1000.0000 mg | Freq: Once | INTRAVENOUS | Status: DC | PRN
  Administered 2019-03-27: 1000 mg via INTRAVENOUS

## 2019-03-27 MED ORDER — NALBUPHINE HCL 10 MG/ML IJ SOLN: 5.0000 mg | Freq: Once | INTRAMUSCULAR | Status: DC | PRN

## 2019-03-27 MED ORDER — HYDRALAZINE HCL 20 MG/ML IJ SOLN
10.0000 mg | INTRAMUSCULAR | Status: DC | PRN
  Administered 2019-03-27: 10 mg via INTRAVENOUS
  Filled 2019-03-27: qty 1

## 2019-03-27 MED ORDER — DIPHENHYDRAMINE HCL 25 MG PO CAPS: 25.0000 mg | ORAL_CAPSULE | ORAL | Status: DC | PRN

## 2019-03-27 MED ORDER — ONDANSETRON HCL 4 MG/2ML IJ SOLN
INTRAMUSCULAR | Status: AC
  Filled 2019-03-27: qty 2

## 2019-03-27 MED ORDER — MEASLES, MUMPS & RUBELLA VAC IJ SOLR: 0.5000 mL | Freq: Once | INTRAMUSCULAR | Status: DC

## 2019-03-27 MED ORDER — LACTATED RINGERS IV SOLN
INTRAVENOUS | Status: DC
  Administered 2019-03-27 (×2): via INTRAVENOUS

## 2019-03-27 MED ORDER — LABETALOL HCL 5 MG/ML IV SOLN: 40.0000 mg | INTRAVENOUS | Status: DC | PRN

## 2019-03-27 MED ORDER — COCONUT OIL OIL: 1.0000 "application " | TOPICAL_OIL | Status: DC | PRN

## 2019-03-27 MED ORDER — MAGNESIUM SULFATE 40 G IN LACTATED RINGERS - SIMPLE
INTRAVENOUS | Status: AC
  Filled 2019-03-27: qty 500

## 2019-03-27 MED ORDER — SODIUM CHLORIDE 0.9% IV SOLUTION: Freq: Once | INTRAVENOUS | Status: DC

## 2019-03-27 MED ORDER — OXYCODONE-ACETAMINOPHEN 5-325 MG PO TABS
1.0000 | ORAL_TABLET | ORAL | Status: DC | PRN
  Administered 2019-03-30: 1 via ORAL
  Administered 2019-03-30: 2 via ORAL
  Filled 2019-03-27: qty 2
  Filled 2019-03-27: qty 1

## 2019-03-27 MED ORDER — PHENYLEPHRINE HCL (PRESSORS) 10 MG/ML IV SOLN
INTRAVENOUS | Status: DC | PRN
  Administered 2019-03-27 (×2): 200 ug via INTRAVENOUS

## 2019-03-27 MED ORDER — SCOPOLAMINE 1 MG/3DAYS TD PT72
1.0000 | MEDICATED_PATCH | Freq: Once | TRANSDERMAL | Status: AC
  Administered 2019-03-27: 1.5 mg via TRANSDERMAL

## 2019-03-27 MED ORDER — NYSTATIN 100000 UNIT/GM EX CREA
1.0000 "application " | TOPICAL_CREAM | Freq: Two times a day (BID) | CUTANEOUS | Status: DC | PRN
  Filled 2019-03-27: qty 15

## 2019-03-27 MED ORDER — FENTANYL CITRATE (PF) 100 MCG/2ML IJ SOLN
INTRAMUSCULAR | Status: AC
  Filled 2019-03-27: qty 2

## 2019-03-27 MED ORDER — SIMETHICONE 80 MG PO CHEW: 80.0000 mg | CHEWABLE_TABLET | ORAL | Status: DC | PRN

## 2019-03-27 MED ORDER — DIPHENHYDRAMINE HCL 50 MG/ML IJ SOLN
INTRAMUSCULAR | Status: AC
  Filled 2019-03-27: qty 1

## 2019-03-27 MED ORDER — FENTANYL CITRATE (PF) 100 MCG/2ML IJ SOLN
INTRAMUSCULAR | Status: DC | PRN
  Administered 2019-03-27: 15 ug via INTRATHECAL

## 2019-03-27 MED ORDER — NALBUPHINE HCL 10 MG/ML IJ SOLN: 5.0000 mg | INTRAMUSCULAR | Status: DC | PRN

## 2019-03-27 MED ORDER — LACTATED RINGERS IV SOLN
INTRAVENOUS | Status: DC
  Administered 2019-03-27 – 2019-03-28 (×2): via INTRAVENOUS

## 2019-03-27 MED ORDER — PHENYLEPHRINE 40 MCG/ML (10ML) SYRINGE FOR IV PUSH (FOR BLOOD PRESSURE SUPPORT)
PREFILLED_SYRINGE | INTRAVENOUS | Status: AC
  Filled 2019-03-27: qty 10

## 2019-03-27 MED ORDER — DEXTROSE 5 % IV SOLN: 3.0000 g | Freq: Once | INTRAVENOUS | Status: DC

## 2019-03-27 MED ORDER — SENNOSIDES-DOCUSATE SODIUM 8.6-50 MG PO TABS
2.0000 | ORAL_TABLET | ORAL | Status: DC
  Administered 2019-03-27 – 2019-03-30 (×4): 2 via ORAL
  Filled 2019-03-27 (×4): qty 2

## 2019-03-27 MED ORDER — NIFEDIPINE ER OSMOTIC RELEASE 30 MG PO TB24
30.0000 mg | ORAL_TABLET | Freq: Every day | ORAL | Status: DC
  Administered 2019-03-27 – 2019-03-31 (×5): 30 mg via ORAL
  Filled 2019-03-27 (×5): qty 1

## 2019-03-27 MED ORDER — SODIUM CHLORIDE 0.9 % IV SOLN
INTRAVENOUS | Status: DC | PRN
  Administered 2019-03-27: 60 ug/min via INTRAVENOUS

## 2019-03-27 MED ORDER — DIPHENHYDRAMINE HCL 25 MG PO CAPS: 25.0000 mg | ORAL_CAPSULE | Freq: Four times a day (QID) | ORAL | Status: DC | PRN

## 2019-03-27 MED ORDER — CEFAZOLIN SODIUM-DEXTROSE 2-4 GM/100ML-% IV SOLN
2.0000 g | Freq: Once | INTRAVENOUS | Status: AC
  Administered 2019-03-27: 2 g via INTRAVENOUS

## 2019-03-27 MED ORDER — DIBUCAINE (PERIANAL) 1 % EX OINT: 1.0000 "application " | TOPICAL_OINTMENT | CUTANEOUS | Status: DC | PRN

## 2019-03-27 MED ORDER — DIPHENHYDRAMINE HCL 50 MG/ML IJ SOLN
12.5000 mg | INTRAMUSCULAR | Status: DC | PRN
  Administered 2019-03-27: 12.5 mg via INTRAVENOUS

## 2019-03-27 MED ORDER — SCOPOLAMINE 1 MG/3DAYS TD PT72
MEDICATED_PATCH | TRANSDERMAL | Status: DC | PRN
  Administered 2019-03-27: 1 via TRANSDERMAL

## 2019-03-27 MED ORDER — SODIUM CHLORIDE 0.9% FLUSH: 3.0000 mL | INTRAVENOUS | Status: DC | PRN

## 2019-03-27 MED ORDER — ONDANSETRON HCL 4 MG/2ML IJ SOLN: 4.0000 mg | Freq: Three times a day (TID) | INTRAMUSCULAR | Status: DC | PRN

## 2019-03-27 MED ORDER — MEPERIDINE HCL 25 MG/ML IJ SOLN
INTRAMUSCULAR | Status: AC
  Filled 2019-03-27: qty 1

## 2019-03-27 MED ORDER — NALOXONE HCL 0.4 MG/ML IJ SOLN: 0.4000 mg | INTRAMUSCULAR | Status: DC | PRN

## 2019-03-27 MED ORDER — OXYTOCIN 40 UNITS IN NORMAL SALINE INFUSION - SIMPLE MED
2.5000 [IU]/h | INTRAVENOUS | Status: AC
  Administered 2019-03-27: 2.5 [IU]/h via INTRAVENOUS

## 2019-03-27 MED ORDER — CEFAZOLIN (ANCEF) 1 G IV SOLR: 2.0000 g | INTRAVENOUS | Status: DC

## 2019-03-27 MED ORDER — IBUPROFEN 800 MG PO TABS
800.0000 mg | ORAL_TABLET | Freq: Three times a day (TID) | ORAL | Status: AC
  Administered 2019-03-27 – 2019-03-30 (×9): 800 mg via ORAL
  Filled 2019-03-27 (×9): qty 1

## 2019-03-27 MED ORDER — NALOXONE HCL 4 MG/10ML IJ SOLN
1.0000 ug/kg/h | INTRAVENOUS | Status: DC | PRN
  Filled 2019-03-27: qty 5

## 2019-03-27 MED ORDER — PHENYLEPHRINE HCL-NACL 20-0.9 MG/250ML-% IV SOLN
INTRAVENOUS | Status: AC
  Filled 2019-03-27: qty 250

## 2019-03-27 SURGICAL SUPPLY — 42 items
APL SKNCLS STERI-STRIP NONHPOA (GAUZE/BANDAGES/DRESSINGS) ×1
BENZOIN TINCTURE PRP APPL 2/3 (GAUZE/BANDAGES/DRESSINGS) ×2 IMPLANT
CHLORAPREP W/TINT 26ML (MISCELLANEOUS) ×2 IMPLANT
CLAMP CORD UMBIL (MISCELLANEOUS) IMPLANT
CLIP FILSHIE TUBAL LIGA STRL (Clip) ×1 IMPLANT
CLOTH BEACON ORANGE TIMEOUT ST (SAFETY) ×2 IMPLANT
DRSG OPSITE POSTOP 3X4 (GAUZE/BANDAGES/DRESSINGS) ×1 IMPLANT
DRSG OPSITE POSTOP 4X10 (GAUZE/BANDAGES/DRESSINGS) ×2 IMPLANT
ELECT REM PT RETURN 9FT ADLT (ELECTROSURGICAL) ×2
ELECTRODE REM PT RTRN 9FT ADLT (ELECTROSURGICAL) ×1 IMPLANT
EXTRACTOR VACUUM BELL STYLE (SUCTIONS) IMPLANT
GAUZE SPONGE 4X4 12PLY STRL LF (GAUZE/BANDAGES/DRESSINGS) ×2 IMPLANT
GLOVE BIO SURGEON STRL SZ7 (GLOVE) ×2 IMPLANT
GLOVE BIOGEL PI IND STRL 7.0 (GLOVE) ×1 IMPLANT
GLOVE BIOGEL PI INDICATOR 7.0 (GLOVE) ×1
GOWN STRL REUS W/TWL LRG LVL3 (GOWN DISPOSABLE) ×4 IMPLANT
HOVERMATT SINGLE USE (MISCELLANEOUS) ×1 IMPLANT
KIT ABG SYR 3ML LUER SLIP (SYRINGE) IMPLANT
NDL HYPO 25X5/8 SAFETYGLIDE (NEEDLE) IMPLANT
NEEDLE HYPO 25X5/8 SAFETYGLIDE (NEEDLE) IMPLANT
NS IRRIG 1000ML POUR BTL (IV SOLUTION) ×2 IMPLANT
PACK C SECTION WH (CUSTOM PROCEDURE TRAY) ×2 IMPLANT
PAD ABD 7.5X8 STRL (GAUZE/BANDAGES/DRESSINGS) ×1 IMPLANT
PAD OB MATERNITY 4.3X12.25 (PERSONAL CARE ITEMS) ×2 IMPLANT
PENCIL SMOKE EVAC W/HOLSTER (ELECTROSURGICAL) ×2 IMPLANT
RETRACTOR TRAXI PANNICULUS (MISCELLANEOUS) IMPLANT
RTRCTR C-SECT PINK 25CM LRG (MISCELLANEOUS) ×2 IMPLANT
STAPLER VISISTAT 35W (STAPLE) ×1 IMPLANT
STRIP CLOSURE SKIN 1/2X4 (GAUZE/BANDAGES/DRESSINGS) ×2 IMPLANT
SUT MNCRL 0 VIOLET CTX 36 (SUTURE) ×2 IMPLANT
SUT MONOCRYL 0 CTX 36 (SUTURE) ×2
SUT PDS AB 0 CTX 60 (SUTURE) ×1 IMPLANT
SUT PLAIN 2 0 XLH (SUTURE) ×2 IMPLANT
SUT VIC AB 0 CT1 27 (SUTURE) ×4
SUT VIC AB 0 CT1 27XBRD ANBCTR (SUTURE) ×2 IMPLANT
SUT VIC AB 2-0 CT1 27 (SUTURE) ×2
SUT VIC AB 2-0 CT1 TAPERPNT 27 (SUTURE) ×1 IMPLANT
SUT VIC AB 4-0 KS 27 (SUTURE) ×2 IMPLANT
TOWEL OR 17X24 6PK STRL BLUE (TOWEL DISPOSABLE) ×2 IMPLANT
TRAXI PANNICULUS RETRACTOR (MISCELLANEOUS) ×1
TRAY FOLEY W/BAG SLVR 14FR LF (SET/KITS/TRAYS/PACK) ×2 IMPLANT
WATER STERILE IRR 1000ML POUR (IV SOLUTION) ×2 IMPLANT

## 2019-03-27 NOTE — Anesthesia Procedure Notes (Signed)
Spinal  Patient location during procedure: OR Start time: 03/27/2019 7:39 AM End time: 03/27/2019 7:49 AM Staffing Anesthesiologist: Freddrick March, MD Performed: anesthesiologist  Preanesthetic Checklist Completed: patient identified, surgical consent, pre-op evaluation, timeout performed, IV checked, risks and benefits discussed and monitors and equipment checked Spinal Block Patient position: sitting Prep: site prepped and draped and DuraPrep Patient monitoring: cardiac monitor, continuous pulse ox and blood pressure Approach: midline Location: L3-4 Injection technique: single-shot Needle Needle type: Pencan  Needle gauge: 24 G Needle length: 9 cm Assessment Sensory level: T6 Additional Notes Functioning IV was confirmed and monitors were applied. Sterile prep and drape, including hand hygiene and sterile gloves were used. The patient was positioned and the spine was prepped. The skin was anesthetized with lidocaine.  Free flow of clear CSF was obtained prior to injecting local anesthetic into the CSF.  The spinal needle aspirated freely following injection.  The needle was carefully withdrawn.  The patient tolerated the procedure well.

## 2019-03-27 NOTE — Lactation Note (Signed)
This note was copied from a baby's chart. Lactation Consultation Note  Patient Name: [name] Date: 03/27/2019 Reason for consult: Initial assessment;Mother's request;Difficult latch(Trisomy 21)  1348 - 1351 - I visited [name] to introduce myself and get her breast feeding history. Mom reports that it has been 20+ years since she delivered her eldest two kids. She wants to breast feed this baby but she was not successful breast feeding her first two. Mom states that this baby has not eaten since delivery.  We discussed setting up a DEBP and working on feeding baby, possibly by bottle. Mom agreed. I indicated that I would return after my appointment at 1400.   1517 - 1612 - I returned to [name], and she stated that she attempted to feed her daughter, [name], by bottle using the gold slow flow nipple. She states that milk spilled all over baby.   Baby was sleeping on mom's lap. Baby would occasionally tremble. We alerted the RN to assess.  I set up a DEBP and showed mom how to use the initiation setting. I recommended that she pump every three hours for fifteen minutes. We used a size 24 flange.  I first showed mom how to express. When mom finished pumping the pediatrician came in to discuss baby's test results. We fed baby mom's colostrum by finger. Baby showed no feeding cues.  I then offered to assist with bottle feeding baby. I was able to get baby to take approximately 7 mls by bottle using the purple Nfant nipple. Some slight leakage occurred at the corners of the mouth. Baby fell asleep after feeding.   Mom's plan is to pump every three hours (both breasts) and to feed baby by bottle. She can offer the breast should baby show hunger cues, and she can call for support as needed tonight.   Mom is interested in a referral to [facility]. I agreed to set that up for her. She is interested in obtaining a DEBP upon discharge.  Maternal Data Formula  Feeding for Exclusion: No Has patient been taught Hand Expression?: Yes Does the patient have breastfeeding experience prior to this delivery?: No(It has been 20+ years since her last delivery)  Feeding Feeding Type: Bottle Fed - Formula Nipple Type: Nfant Slow Flow (purple)     Interventions Interventions: Breast feeding basics reviewed;Breast massage;Hand express;DEBP  Lactation Tools Discussed/Used Tools: Bottle;Pump Breast pump type: Double-Electric Breast Pump WIC Program: Yes Pump Review: Setup, frequency, and cleaning;Milk Storage Initiated by:: hl Date initiated:: 03/27/19   Consult Status Consult Status: Follow-up Date: 03/28/19 Follow-up type: In-patient    [name] 03/27/2019, 4:56 PM

## 2019-03-27 NOTE — Progress Notes (Signed)
Prior to cesarean section, patient was noted to have severe range blood pressure.  It was thought to be related to the phenylephrine administered after her spinal anesthesia was placed.  BP normalized during the cesarean section.  Initially in PACU, blood pressure was at her baseline 140s/90, but slowly began to increase.  She was then noted to have persistent elevation of BP in [id number] systolic (diastolic blood pressure was in the 80s, pulse 48-55). Concern for superimposed preeclampsia versus worsening chronic hypertension. Hypertensive protocol was activated.  She received hydralazine 5mg  then 10mg  with improvement of blood pressure to 130-140/60-80s.  Magnesium sulfate was initiated with plan to continue for 24 hours postpartum.  Patient was seen and examine, plan was discussed.

## 2019-03-27 NOTE — Anesthesia Preprocedure Evaluation (Signed)
Anesthesia Evaluation  Patient identified by MRN, date of birth, ID band Patient awake    Reviewed: Allergy & Precautions, NPO status , Patient's Chart, lab work & pertinent test results, reviewed documented beta blocker date and time   Airway Mallampati: III  TM Distance: >3 FB Neck ROM: Full    Dental no notable dental hx. (+) Teeth Intact, Dental Advisory Given   Pulmonary neg pulmonary ROS,    Pulmonary exam normal breath sounds clear to auscultation       Cardiovascular hypertension, Pt. on home beta blockers and Pt. on medications negative cardio ROS Normal cardiovascular exam Rhythm:Regular Rate:Normal     Neuro/Psych negative neurological ROS  negative psych ROS   GI/Hepatic Neg liver ROS, GERD  ,  Endo/Other  diabetesHypothyroidism Morbid obesityPCOS  Renal/GU negative Renal ROS  negative genitourinary   Musculoskeletal negative musculoskeletal ROS (+)   Abdominal   Peds negative pediatric ROS (+)  Hematology negative hematology ROS (+)   Anesthesia Other Findings   Reproductive/Obstetrics (+) Pregnancy                             Anesthesia Physical Anesthesia Plan  ASA: III  Anesthesia Plan: Spinal   Post-op Pain Management:    Induction:   PONV Risk Score and Plan: 2 and Treatment may vary due to age or medical condition  Airway Management Planned: Natural Airway  Additional Equipment:   Intra-op Plan:   Post-operative Plan:   Informed Consent: I have reviewed the patients History and Physical, chart, labs and discussed the procedure including the risks, benefits and alternatives for the proposed anesthesia with the patient or authorized representative who has indicated his/her understanding and acceptance.     Dental advisory given  Plan Discussed with: CRNA  Anesthesia Plan Comments:         Anesthesia Quick Evaluation

## 2019-03-27 NOTE — Op Note (Signed)
03/27/2019  8:58 AM  PATIENT:  [name]  43 y.o. female  PRE-OPERATIVE DIAGNOSIS:  Repeat cesarean section UNKNOWN / CLASSICAL SCAR EDD: 04/16/2019 NKDA BTL, desires permanent sterilization  POST-OPERATIVE DIAGNOSIS:  Repeat cesarean section UNKNOWN / CLASSICAL SCAR EDD: 04/16/2019 NKDA BTL, desires permanent sterilization  PROCEDURE:  Procedure(s): CESAREAN SECTION WITH BILATERAL TUBAL LIGATION (N/A)  SURGEON:  Surgeon(s) and Role:    * Bobbye Charleston, MD - Primary    * Jerelyn Charles, MD - Assisting  ANESTHESIA:   spinal  EBL:  343 mL   SPECIMEN:  Source of Specimen:  placenta  DISPOSITION OF SPECIMEN:  PATHOLOGY  COUNTS:  YES  TOURNIQUET:  * No tourniquets in log *  DICTATION: .Note written in EPIC  PLAN OF CARE: Admit to inpatient   PATIENT DISPOSITION:  PACU - hemodynamically stable.   Delay start of Pharmacological VTE agent (>24hrs) due to surgical blood loss or risk of bleeding: yes  Complications:  None. Medications:  Ancef, Pitocin Findings:  Baby female, Apgars 5,7,9, weight P.   Normal tubes, ovaries and uterus seen.  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  Baby was briefly skin to skin with mother after birth in the OR after Neo made sure baby's O2 sats were normal.  Mother was feeling nauseous and so baby was on warmer most of the time.   Technique:  After adequate spinal anesthesia was achieved, the patient was prepped and draped in usual sterile fashion.  A foley catheter was used to drain the bladder.  A vertical skin incision was made over the previous vertical skin incision with the scalpel and carried down to the fascia with the bovie cautery. The fascia was incised in the midline with the scalpel and carried in a vertical manner.  The muscles were split in the midline.  A bowel free portion of the peritoneum was entered bluntly and then extended in a superior and inferior  manner with good visualization of the bowel and bladder.  The Alexis instrument was then placed and the vesico-uterine fascia tented up and incised in a transverse curvilinear manner.  A 2 cm transverse incision was made in the upper portion of the lower uterine segment until the amnion was exposed.   The incision was extended transversely in a blunt manner.  Clear fluid was noted and the baby delivered in the vertex presentation without complication.  The baby was bulb suctioned and the cord was clamped and cut after a minute allowing cord blood to flow into baby.  The baby was then handed to awaiting Neonatology.  The placenta was then delivered manually and the uterus cleared of all debris.  The uterine incision was then closed with a running lock stitch of 0 monocryl.  An imbricating layer of 0 monocryl was closed as well. Excellent hemostasis of the uterine incision was achieved and the abdomen was cleared with irrigation.    Attention was then turned to the tubes - wach was identified to the fimbriated ends and a filshie clip placed around the entire circumference of the tubes.  The peritoneum was closed with a running stitch of 2-0 vicryl.  The fascia and muscle were then closed with a running stitch of 0 PDS.  The subcutaneous layer was closed with interrupted  stitches of 2-0 plain gut in two layers.  The skin was closed with staples.  The patient tolerated the procedure well and was returned to the  recovery room in stable condition.  All counts were correct times three.  Daria Pastures

## 2019-03-27 NOTE — Brief Op Note (Signed)
03/27/2019  8:58 AM  PATIENT:  [name]  43 y.o. female  PRE-OPERATIVE DIAGNOSIS:  Repeat cesarean section UNKNOWN / CLASSICAL SCAR EDD: 04/16/2019 NKDA BTL, desires permanent sterilization  POST-OPERATIVE DIAGNOSIS:  Repeat cesarean section UNKNOWN / CLASSICAL SCAR EDD: 04/16/2019 NKDA BTL, desires permanent sterilization  PROCEDURE:  Procedure(s): CESAREAN SECTION WITH BILATERAL TUBAL LIGATION (N/A)  SURGEON:  Surgeon(s) and Role:    * Bobbye Charleston, MD - Primary    * Jerelyn Charles, MD - Assisting  ANESTHESIA:   spinal  EBL:  343 mL   SPECIMEN:  Source of Specimen:  placenta  DISPOSITION OF SPECIMEN:  PATHOLOGY  COUNTS:  YES  TOURNIQUET:  * No tourniquets in log *  DICTATION: .Note written in EPIC  PLAN OF CARE: Admit to inpatient   PATIENT DISPOSITION:  PACU - hemodynamically stable.   Delay start of Pharmacological VTE agent (>24hrs) due to surgical blood loss or risk of bleeding: yes

## 2019-03-27 NOTE — Progress Notes (Signed)
There has been no change in the patients history, status or exam since the history and physical.  Vitals:   03/16/19 1137 03/27/19 0609 03/27/19 0615  BP:   (!) 159/87  Pulse:  78 73  Resp:  18 18  Temp:  98.7 F (37.1 C) 98.7 F (37.1 C)  TempSrc:  Oral Oral  SpO2:   98%  Weight: 107 kg 105.2 kg   Height: 5\' 2"  (1.575 m) 5\' 2"  (1.575 m)     Results for orders placed or performed during the hospital encounter of 03/27/19 (from the past 72 hour(s))  Glucose, capillary     Status: None   Collection Time: 03/27/19  6:35 AM  Result Value Ref Range   Glucose-Capillary 84 70 - 99 mg/dL   Still awaiting labs.  Proceed when CBC back.  Daria Pastures

## 2019-03-27 NOTE — Anesthesia Postprocedure Evaluation (Signed)
Anesthesia Post Note  Patient: [name]  Procedure(s) Performed: CESAREAN SECTION WITH BILATERAL TUBAL LIGATION (N/A )     Patient location during evaluation: PACU Anesthesia Type: Spinal Level of consciousness: oriented and awake and alert Pain management: pain level controlled Vital Signs Assessment: post-procedure vital signs reviewed and stable Respiratory status: spontaneous breathing, respiratory function stable and patient connected to nasal cannula oxygen Cardiovascular status: blood pressure returned to baseline and stable Postop Assessment: no headache, no backache and no apparent nausea or vomiting Anesthetic complications: no    Last Vitals:  Vitals:   03/27/19 1318 03/27/19 1329  BP: (!) 167/77 140/84  Pulse: 70 63  Resp: 17   Temp: 36.8 C   SpO2: 100%     Last Pain:  Vitals:   03/27/19 1318  TempSrc: Oral  PainSc:    Pain Goal:                   Raeana Blinn L Avyaan Summer

## 2019-03-27 NOTE — Transfer of Care (Signed)
Immediate Anesthesia Transfer of Care Note  Patient: [name]  Procedure(s) Performed: CESAREAN SECTION WITH BILATERAL TUBAL LIGATION (N/A )  Patient Location: PACU  Anesthesia Type:Spinal  Level of Consciousness: awake, alert  and oriented  Airway & Oxygen Therapy: Patient Spontanous Breathing  Post-op Assessment: Report given to RN and Post -op Vital signs reviewed and stable  Post vital signs: Reviewed and stable  Last Vitals:  Vitals Value Taken Time  BP 145/114 03/27/2019  9:18 AM  Temp    Pulse 52 03/27/2019  9:19 AM  Resp 12 03/27/2019  9:20 AM  SpO2 100 % 03/27/2019  9:19 AM  Vitals shown include unvalidated device data.  Last Pain:  Vitals:   03/27/19 0615  TempSrc: Oral         Complications: No apparent anesthesia complications

## 2019-03-27 NOTE — Progress Notes (Signed)
Unsuccessful attempt to obtain pre-op labs, phlebotomy contacted to draw stat labs.

## 2019-03-28 LAB — CBC
HCT: 29.1 % — ABNORMAL LOW (ref 36.0–46.0)
Hemoglobin: 9.5 g/dL — ABNORMAL LOW (ref 12.0–15.0)
MCH: 27.9 pg (ref 26.0–34.0)
MCHC: 32.6 g/dL (ref 30.0–36.0)
MCV: 85.6 fL (ref 80.0–100.0)
Platelets: 269 10*3/uL (ref 150–400)
RBC: 3.4 MIL/uL — ABNORMAL LOW (ref 3.87–5.11)
RDW: 14.6 % (ref 11.5–15.5)
WBC: 11.2 10*3/uL — ABNORMAL HIGH (ref 4.0–10.5)
nRBC: 0 % (ref 0.0–0.2)

## 2019-03-28 NOTE — Progress Notes (Signed)
Patient is doing well.  She is tolerating PO, ambulating, voiding.  Pain is controlled.  Lochia is appropriate  Vitals:   03/28/19 0330 03/28/19 0429 03/28/19 0545 03/28/19 0657  BP:  (!) 114/57    Pulse:  (!) 55    Resp: 18 16 18 18   Temp:  97.9 F (36.6 C)    TempSrc:  Oral    SpO2:  95%    Weight:      Height:        NAD Abdomen:  soft, appropriate tenderness, pressure dressing in placed ext:    Symmetric, 1+ edema bilaterally  Lab Results  Component Value Date   WBC 11.2 (H) 03/28/2019   HGB 9.5 (L) 03/28/2019   HCT 29.1 (L) 03/28/2019   MCV 85.6 03/28/2019   PLT 269 03/28/2019    --/--/O POS, O POS Performed at [facility] [address] [place], [place] [address]  (05/29 0643)/[employer]  A/P    43 y.o. G3P2103 POD 1 s/p RCS  Routine post op and postpartum care.   Preeclampsia--severe by BPs.  Received 25mg  IV hydralazine yesterday.  Was started on procardia XL 30mg  daily in addition to her home labetalol.  Labetalol was not increased due to pulse in 40-50s. BPs much improved Vertical skin incision--staples in place--will return to office for removal on POD#7 BMI 42--SCDs, lovenox ppx Ankle fracture at 35 weeks--seeing ortho, in walking boot at this time

## 2019-03-28 NOTE — Lactation Note (Signed)
This note was copied from a baby's chart. Lactation Consultation Note  Patient Name: [name] [id number] Date: 03/28/2019 Reason for consult: Follow-up assessment Baby is 62 hours old.  Mom states she puts baby to breast first and she sucks a few times.  Baby is supplemented with formula by bottle.  Mom is pumping every 3 hours but not obtaining colostrum.  Reassured.  Encouraged to call for assist prn.  Maternal Data    Feeding Feeding Type: Bottle Fed - Formula  LATCH Score                   Interventions    Lactation Tools Discussed/Used     Consult Status Consult Status: Follow-up Date: 03/29/19 Follow-up type: In-patient    Ave Filter 03/28/2019, 2:49 PM

## 2019-03-29 LAB — GLUCOSE, CAPILLARY: Glucose-Capillary: 270 mg/dL — ABNORMAL HIGH (ref 70–99)

## 2019-03-29 NOTE — Progress Notes (Signed)
CSW received consult for hx of Anxiety and Depression.  CSW met with MOB to offer support and complete assessment.    CSW met with MOB at bedside to discuss consult for depression and hx anxiety/panic attacks, FOB present. MOB granted CSW verbal permission to speak in front of FOB about anything noting he does not speak english nor understand english. CSW introduced self and explained reason for consult. CSW inquired about MOB's mental health history, MOB reported that she only takes Prozac to control her shaking. MOB reported that her shaking is caused by medical reasons and usually happens when she is working. MOB denied any history of depression and reported that she has not been formally diagnosed with anxiety. MOB denied any symptoms of depression or anxiety. CSW inquired about how MOB is currently feeling, MOB reported that she is feeling like "the best woman in ever". CSW inquired about MOB's support system, MOB reported that her adult children and husband are her supports. MOB reported that she has all items needed to care for infant at home. MOB presented calm and pleasant. MOB did not demonstrate any acute mental health signs/symptoms. CSW assessed for safety, MOB denied SI, HI and domestic violence. MOB reported that she has the best husband in the world.   CSW provided education regarding the baby blues period vs. perinatal mood disorders, discussed treatment and gave resources for mental health follow up if concerns arise.  CSW recommends self-evaluation during the postpartum time period using the New Mom Checklist from Postpartum Progress and encouraged MOB to contact a medical professional if symptoms are noted at any time.    CSW identifies no further need for intervention and no barriers to discharge at this time.  Abundio Miu, [place] Worker [facility] Cell#: [phone]

## 2019-03-29 NOTE — Lactation Note (Signed)
This note was copied from a baby's chart. Lactation Consultation Note  Patient Name: [name] ASTMH'D Date: 03/29/2019 Reason for consult: Follow-up assessment;Infant < 6lbs;Early term 37-38.6wks;Other (Comment)(trisomy 21) Baby is 54 hours old/4% weight loss.  Baby is receiving phototherapy.  Mom reports she puts baby to breast for each feeding and she sucks a few times.  She post pumps and obtains small amounts of colostrum from right breast but none from left.  Breasts soft.  Baby is taking 20 mls of neosure by bottle.  WIC pump referral faxed to [facility] office.  Encouraged to call for assist prn.  Maternal Data    Feeding Feeding Type: Bottle Fed - Formula Nipple Type: Nfant Slow Flow (purple)  LATCH Score                   Interventions    Lactation Tools Discussed/Used     Consult Status Consult Status: Follow-up Date: 03/30/19 Follow-up type: In-patient    Ave Filter 03/29/2019, 2:55 PM

## 2019-03-29 NOTE — Progress Notes (Signed)
Patient is doing well.  She is tolerating PO, ambulating, voiding.  Pain is controlled.  Lochia is appropriate  Vitals:   03/28/19 1647 03/28/19 1922 03/29/19 0534 03/29/19 0800  BP: (!) 133/59 (!) 135/56 (!) 123/46 (!) 142/62  Pulse: 61 (!) 56 64 (!) 56  Resp: 16 17 18 18   Temp: 98.9 F (37.2 C) 98.8 F (37.1 C) 98.6 F (37 C) 98.6 F (37 C)  TempSrc: Oral Oral  Oral  SpO2: 98% 98% 99%   Weight:      Height:        NAD Abdomen:  soft, appropriate tenderness, Honeycomb with some heme staining at inferior edge. Honeycomb removed.  Moderate bruising at midportion of incision, extending approximately 6 cm to right.  No evidence of drainage from the incision.   ext:    Symmetric, 1+ edema bilaterally  Lab Results  Component Value Date   WBC 11.2 (H) 03/28/2019   HGB 9.5 (L) 03/28/2019   HCT 29.1 (L) 03/28/2019   MCV 85.6 03/28/2019   PLT 269 03/28/2019    --/--/O POS, O POS Performed at [facility] [address] [place], [place] [address]  (05/29 0643)/RImmune  A/P    43 y.o. G3P2103 POD #2 s/p RCS  Routine post op and postpartum care.   Preeclampsia--severe by BPs.  Received 25mg  IV hydralazine on POD#0.  S/p 24 hours of postpartum magnesium sulfate. Was started on procardia XL 30mg  daily in addition to her home labetalol.  Labetalol was not increased due to pulse in 40-50s. BPs much improved.  Will monitor today Vertical skin incision--staples in place--will return to office for removal on POD#7 BMI 42--SCDs, lovenox ppx Ankle fracture at 35 weeks--seeing ortho, in walking boot at this time

## 2019-03-30 MED ORDER — LABETALOL HCL 200 MG PO TABS
200.0000 mg | ORAL_TABLET | Freq: Two times a day (BID) | ORAL | Status: DC
  Administered 2019-03-30 – 2019-03-31 (×3): 200 mg via ORAL
  Filled 2019-03-30 (×3): qty 1

## 2019-03-30 NOTE — Progress Notes (Signed)
POD#3 Pt had an episode of HTN this morning. Since that time b/ps have been wnl. She has mild lochia. On no pain meds. Incision is healing well. Baby still needs bili lights. Baby with susp Downs IMP/ Stable Plan/ Continue with HTN meds. Likely discharge tomorrow.

## 2019-03-30 NOTE — Progress Notes (Signed)
CSW met with MOB at bedside regarding infant's trisomy 21 diagnosis and inquired if she was interested in applying for SSI benefits. MOB reported that she is interested in applying for SSI benefits. CSW explained SSI benefits process and how to apply, MOB verbalized understanding. CSW provided MOB with paperwork. MOB asked if CSW was able to assist with MOB applying for food stamps, CSW provided MOB with phone number for local food stamps office to start application process. MOB denied any other needs/concerns.   Abundio Miu, [place] Worker [facility] Cell#: [phone]

## 2019-03-30 NOTE — Lactation Note (Addendum)
This note was copied from a baby's chart. Lactation Consultation Note  Patient Name: [name] HLKTG'Y Date: 03/30/2019 Reason for consult: Follow-up assessment;Early term 37-38.6wks;Hyperbilirubinemia;1st time breastfeeding;Other (Comment)(Trisomy 21)  P3 mother whose infant is now 44 hours old.  Mother has two grown children but did not breast feed either child.  This is an ETI at 37+1 weeks.  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  Mother has been primarily bottle feeding this baby.  She does try to latch baby, however, baby only sucks a few times at the breast.  She is under phototherapy this morning.  Mother is hoping to be discharged home today, however, her blood pressure was elevated and RN is uncertain as to whether or not she will be discharged at this time.    Mother has been pumping every 3-3 1/2 hours but obtaining only drops of colosturm.  Encouraged mother to pump every 2 1/2-3 hours and to include hand expression before/after pumping to help increase milk supply.  Mother's breasts are soft and non tender and nipples are everted and short shafted.  Mother states that her breasts feel heavier today.  She is feeding back any EBM she obtains to baby.  Mother is not having any difficulties bottle feeding baby.  She is interested in knowing what kind of formula she should obtain after discharge.  I suggested she talk to her pediatrician this morning on rounds regarding this.  Engorgement prevention/treatment discussed.  [place] referral was faxed by previous [place].  Instructions given on how to obtain her loaner pump when discharged.  Father present but does not speak any [place].  Mother has no language barrier and understands all conversation we had this morning.  Encouraged mother to call for assistance as needed.   Maternal Data Formula Feeding for Exclusion: No Has patient been taught Hand Expression?: Yes Does the patient have breastfeeding experience prior to this delivery?:  No  Feeding Feeding Type: Bottle Fed - Formula  LATCH Score                   Interventions    Lactation Tools Discussed/Used WIC Program: Yes   Consult Status Consult Status: Follow-up Date: 03/31/19 Follow-up type: In-patient    [name] R Jehan Bonano 03/30/2019, 8:15 AM

## 2019-03-30 NOTE — Progress Notes (Signed)
Inpatient Diabetes Program Recommendations  AACE/ADA: New Consensus Statement on Inpatient Glycemic Control (2015)  Target Ranges:  Prepandial:   less than 140 mg/dL      Peak postprandial:   less than 180 mg/dL (1-2 hours)      Critically ill patients:  140 - 180 mg/dL   Lab Results  Component Value Date   GLUCAP 270 (H) 03/29/2019   HGBA1C 5.2 04/30/2018    Review of Glycemic Control Results for KRISLYNN, GRONAU (MRN [id number]) as of 03/30/2019 08:33  Ref. Range 02/23/2019 07:31 03/27/2019 06:35 03/29/2019 15:48  Glucose-Capillary Latest Ref Range: 70 - 99 mg/dL 89 84 270 (H)   Diabetes history: Type 2 DM Outpatient Diabetes medications: none Current orders for Inpatient glycemic control: none  Last A1C was 4.7% from 12/09/2018.    Inpatient Diabetes Program Recommendations:    Decadron 4 mg x1 administered on 5/29 for CS. Noted last CBG was 270 mg/dL; assuming this is related to steroids. However, lacking glucose trends. If patient to remain inpatient, consider adding Novolog 0-9 units TID & HS, along with CBGs and have close outpatient follow up.  Thanks, Bronson Curb, MSN, RNC-OB Diabetes Coordinator [phone] (8a-5p)

## 2019-03-31 LAB — TYPE AND SCREEN
ABO/RH(D): O POS
Antibody Screen: NEGATIVE
Unit division: 0
Unit division: 0

## 2019-03-31 LAB — BPAM RBC
Blood Product Expiration Date: 202006232359
Blood Product Expiration Date: 202006232359
Unit Type and Rh: 5100
Unit Type and Rh: 5100

## 2019-03-31 LAB — RPR, QUANT+TP ABS (REFLEX)
Rapid Plasma Reagin, Quant: 1:1 {titer} — ABNORMAL HIGH
T Pallidum Abs: REACTIVE — AB

## 2019-03-31 LAB — RPR: RPR Ser Ql: REACTIVE — AB

## 2019-03-31 MED ORDER — SENNOSIDES-DOCUSATE SODIUM 8.6-50 MG PO TABS: 2.0000 | ORAL_TABLET | Freq: Every evening | ORAL | 1 refills | Status: DC | PRN

## 2019-03-31 MED ORDER — LABETALOL HCL 200 MG PO TABS: 200.0000 mg | ORAL_TABLET | Freq: Two times a day (BID) | ORAL | 1 refills | Status: DC

## 2019-03-31 MED ORDER — OXYCODONE-ACETAMINOPHEN 5-325 MG PO TABS: 1.0000 | ORAL_TABLET | Freq: Four times a day (QID) | ORAL | 0 refills | Status: DC | PRN

## 2019-03-31 MED ORDER — NIFEDIPINE ER 30 MG PO TB24: 30.0000 mg | ORAL_TABLET | Freq: Every day | ORAL | 1 refills | Status: DC

## 2019-03-31 NOTE — Lactation Note (Signed)
This note was copied from a baby's chart. Lactation Consultation Note  Patient Name: [name] VPXTG'G Date: 03/31/2019 Reason for consult: Follow-up assessment;Early term 74-38.6wks  P3 mother whose infant is now 10 hours old. Mother has two grown children but did not breast feed either child.  This is an ETI at 37+1 weeks.  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  Mother was dressing baby when I arrived.  She had no questions related to breast feeding.  Her breasts are full and her milk has transitioned.  She stated that baby does not want to latch to the breast yet but she will work on breast feeding at home. Mother is pumping good volume of breast milk now and has no difficulty feeding baby.    Reviewed engorgement prevention/treatment yesterday; mother with no questions.  She is planning on obtaining [facility] from [facility] today after discharge.  She has our OP phone number for questions/concerns after discharge.  Father present.   Maternal Data Formula Feeding for Exclusion: Yes Reason for exclusion: Mother's choice to formula and breast feed on admission Has patient been taught Hand Expression?: Yes Does the patient have breastfeeding experience prior to this delivery?: No  Feeding    LATCH Score                   Interventions    Lactation Tools Discussed/Used WIC Program: Yes   Consult Status Consult Status: Complete Date: 03/31/19 Follow-up type: Call as needed    [name] 03/31/2019, 7:58 AM

## 2019-03-31 NOTE — Discharge Summary (Signed)
Obstetric Discharge Summary Reason for Admission: cesarean section Prenatal Procedures: NST and ultrasound Intrapartum Procedures: cesarean: low cervical, transverse& permanent sterilization Postpartum Procedures: none Complications-Operative and Postpartum: none Hemoglobin  Date Value Ref Range Status  03/28/2019 9.5 (L) 12.0 - 15.0 g/dL Final  04/30/2018 12.5 11.1 - 15.9 g/dL Final   HCT  Date Value Ref Range Status  03/28/2019 29.1 (L) 36.0 - 46.0 % Final   Hematocrit  Date Value Ref Range Status  04/30/2018 38.1 34.0 - 46.6 % Final    Physical Exam:  General: alert, cooperative and appears stated age 16: appropriate Uterine Fundus: firm Incision: healing well DVT Evaluation: No evidence of DVT seen on physical exam.  Discharge Diagnoses: Term Pregnancy-delivered  Discharge Information: Date: 03/31/2019 Activity: pelvic rest Diet: routine Medications: Ibuprofen, Percocet and senna Condition: improved Instructions: refer to practice specific booklet Discharge to: home Follow-up Information    Bobbye Charleston, MD Follow up on 04/03/2019.   Specialty:  Obstetrics and Gynecology Why:  For staple removal Contact information: [place] [address] [phone]           Newborn Data: Live born female  Birth Weight: 6 lb 2.4 oz (2790 g) APGAR: 5, 7  Newborn Delivery   Birth date/time:  03/27/2019 08:12:00 Delivery type:  C-Section, Low Transverse Trial of labor:  No C-section categorization:  Repeat     Home with mother.  Vanessa Kick 03/31/2019, 10:23 AM

## 2019-07-09 ENCOUNTER — Encounter (HOSPITAL_COMMUNITY): Payer: Self-pay

## 2019-07-21 ENCOUNTER — Encounter: Payer: Self-pay | Admitting: Gynecology

## 2019-10-15 ENCOUNTER — Ambulatory Visit: Payer: Medicaid Other | Attending: Internal Medicine

## 2019-10-15 ENCOUNTER — Other Ambulatory Visit: Payer: Self-pay

## 2019-10-15 DIAGNOSIS — Z20822 Contact with and (suspected) exposure to covid-19: Secondary | ICD-10-CM

## 2019-10-17 LAB — NOVEL CORONAVIRUS, NAA: SARS-CoV-2, NAA: NOT DETECTED

## 2019-10-30 HISTORY — PX: CATARACT EXTRACTION W/ INTRAOCULAR LENS IMPLANT: SHX1309

## 2019-12-09 ENCOUNTER — Other Ambulatory Visit: Payer: Self-pay | Admitting: Endocrinology

## 2019-12-09 NOTE — Telephone Encounter (Signed)
1.  Please schedule f/u appt 2.  Then please refill x 1, pending that appt.  

## 2019-12-09 NOTE — Telephone Encounter (Signed)
LOV advised f/u in 1 month. Pt did not schedule. Please advise

## 2019-12-14 ENCOUNTER — Ambulatory Visit: Payer: Medicaid Other | Attending: Internal Medicine

## 2019-12-14 DIAGNOSIS — Z23 Encounter for immunization: Secondary | ICD-10-CM | POA: Insufficient documentation

## 2019-12-14 NOTE — Progress Notes (Signed)
   Covid-19 Vaccination Clinic  Name:  [name]    MRN: [id number] DOB: 1976/02/15  12/14/2019  [name] was observed post Covid-19 immunization for 15 minutes without incidence. She was provided with Vaccine Information Sheet and instruction to access the V-Safe system.   [name] was instructed to call 911 with any severe reactions post vaccine: Marland Kitchen Difficulty breathing  . Swelling of your face and throat  . A fast heartbeat  . A bad rash all over your body  . Dizziness and weakness    Immunizations Administered    Name Date Dose VIS Date Route   Pfizer COVID-19 Vaccine 12/14/2019  6:19 PM 0.3 mL 10/09/2019 Intramuscular   Manufacturer: [place]   Lot: [id number]   [place]: [id number]

## 2020-01-05 ENCOUNTER — Ambulatory Visit: Payer: Medicaid Other | Attending: Internal Medicine

## 2020-01-05 DIAGNOSIS — Z23 Encounter for immunization: Secondary | ICD-10-CM | POA: Insufficient documentation

## 2020-01-05 NOTE — Progress Notes (Signed)
   Covid-19 Vaccination Clinic  Name:  [name]    MRN: [id number] DOB: 1975/11/04  01/05/2020  [name] was observed post Covid-19 immunization for 30 minutes based on pre-vaccination screening without incident. She was provided with Vaccine Information Sheet and instruction to access the V-Safe system.   [name] was instructed to call 911 with any severe reactions post vaccine: Marland Kitchen Difficulty breathing  . Swelling of face and throat  . A fast heartbeat  . A bad rash all over body  . Dizziness and weakness   Immunizations Administered    Name Date Dose VIS Date Route   Pfizer COVID-19 Vaccine 01/05/2020  6:42 PM 0.3 mL 10/09/2019 Intramuscular   Manufacturer: [place]   Lot: [id number]   [place]: [id number]

## 2020-01-06 ENCOUNTER — Ambulatory Visit: Payer: Medicaid Other

## 2020-02-29 ENCOUNTER — Other Ambulatory Visit: Payer: Self-pay | Admitting: Family Medicine

## 2020-02-29 DIAGNOSIS — R1084 Generalized abdominal pain: Secondary | ICD-10-CM

## 2020-02-29 DIAGNOSIS — E890 Postprocedural hypothyroidism: Secondary | ICD-10-CM

## 2020-03-04 ENCOUNTER — Other Ambulatory Visit: Payer: Self-pay | Admitting: Family Medicine

## 2020-03-04 DIAGNOSIS — Z1231 Encounter for screening mammogram for malignant neoplasm of breast: Secondary | ICD-10-CM

## 2020-03-14 ENCOUNTER — Other Ambulatory Visit: Payer: Medicaid Other

## 2020-03-14 ENCOUNTER — Ambulatory Visit: Payer: Medicaid Other

## 2020-04-04 ENCOUNTER — Ambulatory Visit
Admission: RE | Admit: 2020-04-04 | Discharge: 2020-04-04 | Disposition: A | Discharge status: Home / Self Care | Payer: Medicaid Other | Source: Ambulatory Visit | Attending: Family Medicine | Admitting: Family Medicine

## 2020-04-04 ENCOUNTER — Other Ambulatory Visit: Payer: Self-pay

## 2020-04-04 DIAGNOSIS — Z1231 Encounter for screening mammogram for malignant neoplasm of breast: Secondary | ICD-10-CM

## 2020-04-04 DIAGNOSIS — R1084 Generalized abdominal pain: Secondary | ICD-10-CM

## 2020-04-04 DIAGNOSIS — E890 Postprocedural hypothyroidism: Secondary | ICD-10-CM

## 2020-04-19 ENCOUNTER — Ambulatory Visit
Admission: RE | Admit: 2020-04-19 | Discharge: 2020-04-19 | Disposition: A | Discharge status: Home / Self Care | Payer: Medicaid Other | Source: Ambulatory Visit | Attending: Family Medicine | Admitting: Family Medicine

## 2020-08-17 ENCOUNTER — Emergency Department (HOSPITAL_COMMUNITY)
Admission: EM | Admit: 2020-08-17 | Discharge: 2020-08-18 | Disposition: A | Discharge status: Home / Self Care | Payer: Medicaid Other | Attending: Emergency Medicine | Admitting: Emergency Medicine

## 2020-08-17 ENCOUNTER — Other Ambulatory Visit: Payer: Self-pay

## 2020-08-17 ENCOUNTER — Encounter (HOSPITAL_COMMUNITY): Payer: Self-pay

## 2020-08-17 DIAGNOSIS — D649 Anemia, unspecified: Secondary | ICD-10-CM | POA: Diagnosis not present

## 2020-08-17 DIAGNOSIS — I10 Essential (primary) hypertension: Secondary | ICD-10-CM | POA: Insufficient documentation

## 2020-08-17 DIAGNOSIS — E039 Hypothyroidism, unspecified: Secondary | ICD-10-CM | POA: Diagnosis not present

## 2020-08-17 DIAGNOSIS — E119 Type 2 diabetes mellitus without complications: Secondary | ICD-10-CM | POA: Insufficient documentation

## 2020-08-17 DIAGNOSIS — Z79899 Other long term (current) drug therapy: Secondary | ICD-10-CM | POA: Diagnosis not present

## 2020-08-17 DIAGNOSIS — R799 Abnormal finding of blood chemistry, unspecified: Secondary | ICD-10-CM | POA: Diagnosis present

## 2020-08-17 DIAGNOSIS — R531 Weakness: Secondary | ICD-10-CM | POA: Insufficient documentation

## 2020-08-17 LAB — CBC WITH DIFFERENTIAL/PLATELET
Abs Immature Granulocytes: 0.02 10*3/uL (ref 0.00–0.07)
Basophils Absolute: 0.1 10*3/uL (ref 0.0–0.1)
Basophils Relative: 1 %
Eosinophils Absolute: 0.3 10*3/uL (ref 0.0–0.5)
Eosinophils Relative: 3 %
HCT: 28.1 % — ABNORMAL LOW (ref 36.0–46.0)
Hemoglobin: 7.5 g/dL — ABNORMAL LOW (ref 12.0–15.0)
Immature Granulocytes: 0 %
Lymphocytes Relative: 28 %
Lymphs Abs: 2.5 10*3/uL (ref 0.7–4.0)
MCH: 16.6 pg — ABNORMAL LOW (ref 26.0–34.0)
MCHC: 26.7 g/dL — ABNORMAL LOW (ref 30.0–36.0)
MCV: 62.3 fL — ABNORMAL LOW (ref 80.0–100.0)
Monocytes Absolute: 0.7 10*3/uL (ref 0.1–1.0)
Monocytes Relative: 8 %
Neutro Abs: 5.1 10*3/uL (ref 1.7–7.7)
Neutrophils Relative %: 60 %
Platelets: 443 10*3/uL — ABNORMAL HIGH (ref 150–400)
RBC: 4.51 MIL/uL (ref 3.87–5.11)
RDW: 20.4 % — ABNORMAL HIGH (ref 11.5–15.5)
WBC: 8.7 10*3/uL (ref 4.0–10.5)
nRBC: 0 % (ref 0.0–0.2)

## 2020-08-17 LAB — BASIC METABOLIC PANEL
Anion gap: 9 (ref 5–15)
BUN: 14 mg/dL (ref 6–20)
CO2: 26 mmol/L (ref 22–32)
Calcium: 8.6 mg/dL — ABNORMAL LOW (ref 8.9–10.3)
Chloride: 101 mmol/L (ref 98–111)
Creatinine, Ser: 0.77 mg/dL (ref 0.44–1.00)
GFR, Estimated: >60 mL/min (ref >60)
Glucose, Bld: 120 mg/dL — ABNORMAL HIGH (ref 70–99)
Potassium: 3.3 mmol/L — ABNORMAL LOW (ref 3.5–5.1)
Sodium: 136 mmol/L (ref 135–145)

## 2020-08-17 LAB — PREPARE RBC (CROSSMATCH)

## 2020-08-17 MED ORDER — SODIUM CHLORIDE 0.9 % IV SOLN: 10.0000 mL/h | Freq: Once | INTRAVENOUS | Status: DC

## 2020-08-17 NOTE — Discharge Instructions (Addendum)
It is very important that you take your previously prescribed vitamins for ongoing management of your anemia.  Follow-up with your physician or return here for concerning changes in your condition.

## 2020-08-17 NOTE — ED Triage Notes (Addendum)
Pt presents with c/o abnormal lab. Pt reports her MD sent her for a hgb of 6.7. Pt reports she has been feeling sick with nausea for the past couple of days. Pt reports she does have some food allergies and came in contact with one yesterday while eating and still feels like her throat is bothering her from that, no distress noted.

## 2020-08-17 NOTE — ED Provider Notes (Signed)
[facility] DEPT Provider Note   CSN: [id number] Arrival date & time: 08/17/20  1517     History Chief Complaint  Patient presents with  . Abnormal Lab    [name] is a 44 y.o. female.  HPI  Patient presents from her primary care physician's office with concern of weakness and anemia.  Patient notes a history of baseline low hemoglobin, and she has previously been instructed to take supplements including iron.  She notes that she has not been taking them possibly for as long as the past 2 months.  She notes that she has recently had more fatigue, weakness, mild dyspnea with activity, but no pain, no syncope.  Today, after she was sent to her physicians, following a morning dentist visit which was not completed secondary to the patient's fatigue, she was sent here with reports of abnormal hemoglobin value in the office. Patient denies vaginal bleeding, melena, hematemesis.  She does have prior tubal ligation.  Patient is 16 weeks status post delivery of daughter.  Past Medical History:  Diagnosis Date  . Carpal tunnel syndrome, bilateral   . Chronic hypertension   . Depression   . GERD (gastroesophageal reflux disease)   . Gestational diabetes   . H. pylori infection    dx  09-02-2014-- treated w/ antibiotic  . Hypertension    recently low pressures   . Hypothyroid   . Irregular menstrual cycle   . Menorrhagia   . PCOS (polycystic ovarian syndrome)   . Type 2 diabetes mellitus [facility])     Patient Active Problem List   Diagnosis Date Noted  . Postoperative state 03/27/2019  . Vaginal bleeding in pregnancy, third trimester 03/11/2019  . GBS bacteriuria 01/22/2019  . Pregnancy of unknown anatomic location 08/10/2018  . Intertrigo 11/22/2017  . S/P gastric bypass 04/04/2017  . Morbid obesity ([place]) 03/12/2017  . Hypothyroidism 09/11/2016  . Allergic rhinitis due to pollen 06/29/2015  . Anxiety and depression 06/29/2015  .  Gastroesophageal reflux disease without esophagitis 06/29/2015  . OSA on CPAP 06/29/2015  . History of syphilis 05/23/2015  . Irregular menstrual cycle 11/12/2013  . POLYCYSTIC OVARIAN DISEASE 06/20/2009    Past Surgical History:  Procedure Laterality Date  . CARPAL TUNNEL RELEASE Bilateral 10/07/2014   Procedure: CARPAL TUNNEL RELEASE BILATERAL;  Surgeon: [name], MD;  Location[facility];  Service: Orthopedics;  Laterality: Bilateral;  . CESAREAN SECTION  1995  . CESAREAN SECTION WITH BILATERAL TUBAL LIGATION N/A 03/27/2019   Procedure: CESAREAN SECTION WITH BILATERAL TUBAL LIGATION;  Surgeon: Bobbye Charleston, MD;  Location: [facility] ORS;  Service: Obstetrics;  Laterality: N/A;  . GASTRIC ROUX-EN-Y N/A 03/12/2017   Procedure: LAPAROSCOPIC ROUX-EN-Y GASTRIC BYPASS WITH UPPER ENDOSCOPY;  Surgeon: Kieth Brightly, Arta Bruce, MD;  Location: [facility] ORS;  Service: General;  Laterality: N/A;  . UPPER GI ENDOSCOPY  03/12/2017   Procedure: UPPER GI ENDOSCOPY;  Surgeon: Kieth Brightly, Arta Bruce, MD;  Location: [facility] ORS;  Service: General;;     OB History    Gravida  3   Para  2   Term  1   Preterm  1   AB      Living  2     SAB      TAB      Ectopic      Multiple      Live Births  2           Family History  Problem Relation Age  of Onset  . Heart disease Mother 8       CAD  . Diabetes Mother   . Hypertension Mother   . Cancer Mother   . Cancer Father 26       prostate cancer  . Diabetes Father   . Breast cancer Sister   . Breast cancer Other   . Breast cancer Sister   . Thyroid disease Neg Hx     Social History   Tobacco Use  . Smoking status: Never Smoker  . Smokeless tobacco: Never Used  Vaping Use  . Vaping Use: Never used  Substance Use Topics  . Alcohol use: Never    Alcohol/week: 0.0 standard drinks  . Drug use: No    Home Medications Prior to Admission medications   Medication Sig Start Date End Date Taking? Authorizing Provider    clindamycin (CLINDAGEL) 1 % gel Apply 1 application topically daily as needed (redness of face).   Yes [provider]  diclofenac Sodium (VOLTAREN) 1 % GEL Apply 2 g topically daily as needed (pain).    Yes [provider]  FLUoxetine (PROZAC) 20 MG capsule Take 20 mg by mouth See admin instructions. Takes 3 times a week. 12/17/19 12/16/20 Yes [provider]  levothyroxine (SYNTHROID, LEVOTHROID) 200 MCG tablet Take 1 tablet (200 mcg total) by mouth daily before breakfast. Patient taking differently: Take 200 mcg by mouth daily at 2 am.  12/03/18  Yes Renato Shin, MD  lisinopril-hydrochlorothiazide (ZESTORETIC) 20-12.5 MG tablet Take 1 tablet by mouth daily.  05/06/20 05/06/21 Yes [provider]  Multiple Vitamin (MULTI-VITAMIN) tablet Take 1 tablet by mouth daily.    Yes [provider]  nystatin (MYCOSTATIN/NYSTOP) powder Apply 1 application topically 2 (two) times daily.   Yes [provider]  nystatin cream (MYCOSTATIN) Apply 1 application topically See admin instructions. Twice a week   Yes [provider]  labetalol (NORMODYNE) 200 MG tablet Take 1 tablet (200 mg total) by mouth 2 (two) times daily. Patient not taking: Reported on 08/17/2020 03/31/19   Vanessa Kick, MD  NIFEdipine (ADALAT CC) 30 MG 24 hr tablet Take 1 tablet (30 mg total) by mouth daily. Patient not taking: Reported on 08/17/2020 04/01/19   Vanessa Kick, MD  oxyCODONE-acetaminophen (PERCOCET/ROXICET) 5-325 MG tablet Take 1-2 tablets by mouth every 6 (six) hours as needed for severe pain. Patient not taking: Reported on 08/17/2020 03/31/19   Vanessa Kick, MD  senna-docusate (SENOKOT-S) 8.6-50 MG tablet Take 2 tablets by mouth at bedtime as needed for mild constipation. Patient not taking: Reported on 08/17/2020 03/31/19   Vanessa Kick, MD    Allergies    Food, Peach [prunus persica], Shrimp [shellfish allergy], and Plasticized base [plastibase]  Review of Systems    Review of Systems  Constitutional:       Per HPI, otherwise negative  HENT:       Per HPI, otherwise negative  Respiratory:       Per HPI, otherwise negative  Cardiovascular:       Per HPI, otherwise negative  Gastrointestinal: Negative for vomiting.  Endocrine:       Negative aside from HPI  Genitourinary:       Neg aside from HPI   Musculoskeletal:       Per HPI, otherwise negative  Skin: Negative.   Neurological: Positive for weakness and light-headedness. Negative for syncope.    Physical Exam Updated Vital Signs BP (!) 170/94   Pulse 61   Temp 98  F (36.7 C) (Oral)   Resp 18   Ht 5\' 2"  (1.575 m)   Wt 106.1 kg   LMP 07/27/2020 (Approximate)   SpO2 100%   BMI 42.80 kg/m   Physical Exam Vitals and nursing note reviewed.  Constitutional:      General: She is not in acute distress.    Appearance: She is well-developed. She is obese. She is not ill-appearing, toxic-appearing or diaphoretic.  HENT:     Head: Normocephalic and atraumatic.  Eyes:     Conjunctiva/sclera: Conjunctivae normal.  Pulmonary:     Effort: Pulmonary effort is normal. No respiratory distress.     Breath sounds: No stridor.  Abdominal:     General: There is no distension.  Skin:    General: Skin is warm and dry.  Neurological:     Mental Status: She is alert and oriented to person, place, and time.     Cranial Nerves: No cranial nerve deficit.  Psychiatric:        Mood and Affect: Mood normal.        Behavior: Behavior normal.     ED Results / Procedures / Treatments   Labs (all labs ordered are listed, but only abnormal results are displayed) Labs Reviewed  BASIC METABOLIC PANEL - Abnormal; Notable for the following components:      Result Value   Potassium 3.3 (*)    Glucose, Bld 120 (*)    Calcium 8.6 (*)    All other components within normal limits  CBC WITH DIFFERENTIAL/PLATELET - Abnormal; Notable for the following components:   Hemoglobin 7.5 (*)    HCT 28.1 (*)     MCV 62.3 (*)    MCH 16.6 (*)    MCHC 26.7 (*)    RDW 20.4 (*)    Platelets 443 (*)    All other components within normal limits  TYPE AND SCREEN  PREPARE RBC (CROSSMATCH)     Procedures Procedures (including critical care time)  Medications Ordered in ED Medications  0.9 %  sodium chloride infusion (0 mL/hr Intravenous Hold 08/17/20 2119)    ED Course  I have reviewed the triage vital signs and the nursing notes.  Pertinent labs & imaging results that were available during my care of the patient were reviewed by me and considered in my medical decision making (see chart for details).  Update:, Labs notable for hemoglobin 7.5.  Given the patient's lightheadedness, weakness, concern for symptomatic anemia, transfusion for symptomatic anemia will proceed. 11:23 PM Patient has nearly completed her blood transfusion, has had no adverse reactions, is awake, alert. We also discussed the importance of taking her medication as directed With otherwise reassuring labs, vitals, now following transfusion for symptomatic anemia, the patient will be appropriate for discharge with outpatient follow-up. Final Clinical Impression(s) / ED Diagnoses Final diagnoses:  Symptomatic anemia      Carmin Muskrat, MD 08/17/20 2325

## 2020-08-18 LAB — TYPE AND SCREEN
ABO/RH(D): O POS
Antibody Screen: NEGATIVE
Unit division: 0

## 2020-08-18 LAB — BPAM RBC
Blood Product Expiration Date: 202111202359
ISSUE DATE / TIME: 202110202102
Unit Type and Rh: 5100

## 2020-09-13 HISTORY — PX: EYE SURGERY: SHX253

## 2021-04-13 ENCOUNTER — Other Ambulatory Visit: Payer: Self-pay

## 2021-04-13 ENCOUNTER — Emergency Department (HOSPITAL_COMMUNITY)
Admission: EM | Admit: 2021-04-13 | Discharge: 2021-04-13 | Disposition: A | Discharge status: Home / Self Care | Payer: Medicaid Other | Attending: Emergency Medicine | Admitting: Emergency Medicine

## 2021-04-13 ENCOUNTER — Encounter (HOSPITAL_COMMUNITY): Payer: Self-pay | Admitting: *Deleted

## 2021-04-13 DIAGNOSIS — R519 Headache, unspecified: Secondary | ICD-10-CM | POA: Insufficient documentation

## 2021-04-13 DIAGNOSIS — Z79899 Other long term (current) drug therapy: Secondary | ICD-10-CM | POA: Insufficient documentation

## 2021-04-13 DIAGNOSIS — I10 Essential (primary) hypertension: Secondary | ICD-10-CM | POA: Diagnosis not present

## 2021-04-13 DIAGNOSIS — E039 Hypothyroidism, unspecified: Secondary | ICD-10-CM | POA: Diagnosis not present

## 2021-04-13 DIAGNOSIS — E119 Type 2 diabetes mellitus without complications: Secondary | ICD-10-CM | POA: Diagnosis not present

## 2021-04-13 DIAGNOSIS — R6884 Jaw pain: Secondary | ICD-10-CM | POA: Insufficient documentation

## 2021-04-13 NOTE — Discharge Instructions (Signed)
Continue to take your medications as prescribed.

## 2021-04-13 NOTE — ED Triage Notes (Signed)
Pt reports high stress level and received bad news this morning, had not taken her medications this morning. Then onset of upper mouth, jaw pain. Denies chest pain, arm pain or sob.

## 2021-04-13 NOTE — ED Provider Notes (Signed)
[facility] EMERGENCY DEPARTMENT Provider Note   CSN: [id number] Arrival date & time: 04/13/21  1405     History Chief Complaint  Patient presents with   Jaw Pain   Facial Pain    [name] is a 45 y.o. female.  HPI     [name] is a 45 y.o. female, with a history of HTN, GERD, DM, presenting to the ED with facial pain beginning around 10AM this morning.  Patient states she received some upsetting news about the death of a close family member this morning.  She began clenching her jaw and crying.  She began to feel pressure in the bilateral maxillary region of the face.  She took her blood pressure medication and her symptoms resolved.  Denies fever/chills, dizziness, syncope, chest pain, shortness of breath, neck pain, extremity pain, back pain, N/V/D, or any other complaints.    Past Medical History:  Diagnosis Date   Carpal tunnel syndrome, bilateral    Chronic hypertension    Depression    GERD (gastroesophageal reflux disease)    Gestational diabetes    H. 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   Morbid obesity ([place]) 03/12/2017   Hypothyroidism 09/11/2016   Allergic rhinitis due to pollen 06/29/2015   Anxiety and depression 06/29/2015   Gastroesophageal reflux disease without esophagitis 06/29/2015   OSA on CPAP 06/29/2015   History of syphilis 05/23/2015   Irregular menstrual cycle 11/12/2013   POLYCYSTIC OVARIAN DISEASE 06/20/2009    Past  Surgical History:  Procedure Laterality Date   CARPAL TUNNEL RELEASE Bilateral 10/07/2014   Procedure: CARPAL TUNNEL RELEASE BILATERAL;  Surgeon: [name], MD;  Location: [place];  Service: Orthopedics;  Laterality: Bilateral;   CESAREAN SECTION  1995   CESAREAN SECTION WITH BILATERAL TUBAL LIGATION N/A 03/27/2019   Procedure: CESAREAN SECTION WITH BILATERAL TUBAL LIGATION;  Surgeon: Bobbye Charleston, MD;  Location: [facility];  Service: Obstetrics;  Laterality: N/A;   GASTRIC ROUX-EN-Y N/A 03/12/2017   Procedure: LAPAROSCOPIC ROUX-EN-Y GASTRIC BYPASS WITH UPPER ENDOSCOPY;  Surgeon: Kieth Brightly, Arta Bruce, MD;  Location: [facility];  Service: General;  Laterality: N/A;   UPPER GI ENDOSCOPY  03/12/2017   Procedure: UPPER GI ENDOSCOPY;  Surgeon: Kieth Brightly, Arta Bruce, MD;  [facility];  Service: General;;     OB History     Gravida  3   Para  2   Term  1   Preterm  1   AB      Living  2      SAB      IAB      Ectopic      Multiple      Live Births  2           Family History  Problem Relation Age of Onset   Heart disease Mother 23       CAD   Diabetes Mother  Hypertension Mother    Cancer Mother    Cancer Father 67       prostate cancer   Diabetes Father    Breast cancer Sister    Breast cancer Other    Breast cancer Sister    Thyroid disease Neg Hx     Social History   Tobacco Use   Smoking status: Never   Smokeless tobacco: Never  Vaping Use   Vaping Use: Never used  Substance Use Topics   Alcohol use: Never    Alcohol/week: 0.0 standard drinks   Drug use: No    Home Medications Prior to Admission medications   Medication Sig Start Date End Date Taking? Authorizing Provider  clindamycin (CLINDAGEL) 1 % gel Apply 1 application topically daily as needed (redness of face).    [provider]  diclofenac Sodium (VOLTAREN) 1 % GEL Apply 2 g topically daily as needed (pain).     [provider]   labetalol (NORMODYNE) 200 MG tablet Take 1 tablet (200 mg total) by mouth 2 (two) times daily. Patient not taking: Reported on 08/17/2020 03/31/19   Vanessa Kick, MD  levothyroxine (SYNTHROID, LEVOTHROID) 200 MCG tablet Take 1 tablet (200 mcg total) by mouth daily before breakfast. Patient taking differently: Take 200 mcg by mouth daily at 2 am.  12/03/18   Renato Shin, MD  lisinopril-hydrochlorothiazide (ZESTORETIC) 20-12.5 MG tablet Take 1 tablet by mouth daily.  05/06/20 05/06/21  [provider]  Multiple Vitamin (MULTI-VITAMIN) tablet Take 1 tablet by mouth daily.     [provider]  NIFEdipine (ADALAT CC) 30 MG 24 hr tablet Take 1 tablet (30 mg total) by mouth daily. Patient not taking: Reported on 08/17/2020 04/01/19   Vanessa Kick, MD  nystatin (MYCOSTATIN/NYSTOP) powder Apply 1 application topically 2 (two) times daily.    [provider]  nystatin cream (MYCOSTATIN) Apply 1 application topically See admin instructions. Twice a week    [provider]  oxyCODONE-acetaminophen (PERCOCET/ROXICET) 5-325 MG tablet Take 1-2 tablets by mouth every 6 (six) hours as needed for severe pain. Patient not taking: Reported on 08/17/2020 03/31/19   Vanessa Kick, MD  senna-docusate (SENOKOT-S) 8.6-50 MG tablet Take 2 tablets by mouth at bedtime as needed for mild constipation. Patient not taking: Reported on 08/17/2020 03/31/19   Vanessa Kick, MD    Allergies    Food, Peach [prunus persica], Shrimp [shellfish allergy], and Plasticized base [plastibase]  Review of Systems   Review of Systems  Constitutional:  Negative for chills, diaphoresis and fever.  HENT:  Negative for facial swelling and trouble swallowing.        Facial pain  Respiratory:  Negative for cough and shortness of breath.   Cardiovascular:  Negative for chest pain and leg swelling.  Neurological:  Negative for dizziness, syncope, weakness, numbness and headaches.  All other systems reviewed and are  negative.  Physical Exam Updated Vital Signs BP (!) 153/87 (BP Location: Right Arm)   Pulse 71   Temp 98.6 F (37 C) (Oral)   Resp 16   SpO2 99%   Physical Exam Vitals and nursing note reviewed.  Constitutional:      General: She is not in acute distress.    Appearance: She is well-developed. She is not diaphoretic.  HENT:     Head: Normocephalic and atraumatic.     Comments: No tenderness, swelling, erythema, or other abnormalities noted to the face.    Mouth/Throat:     Mouth: Mucous membranes are  moist.     Pharynx: Oropharynx is clear.     Comments: Dentition appears to be intact and stable.  No noted area of intraoral swelling or fluctuance.  No trismus or noted abnormal phonation.  Mouth opening to at least 3 finger widths.  Handles oral secretions without difficulty.  No noted facial swelling.  No sublingual swelling or tongue elevation.  No swelling or tenderness to the submental or submandibular regions.  No swelling or tenderness into the soft tissues of the neck. Eyes:     Conjunctiva/sclera: Conjunctivae normal.  Cardiovascular:     Rate and Rhythm: Normal rate and regular rhythm.     Pulses: Normal pulses.          Radial pulses are 2+ on the right side and 2+ on the left side.       Posterior tibial pulses are 2+ on the right side and 2+ on the left side.     Heart sounds: Normal heart sounds.     Comments: Tactile temperature in the extremities appropriate and equal bilaterally. Pulmonary:     Effort: Pulmonary effort is normal. No respiratory distress.     Breath sounds: Normal breath sounds.  Abdominal:     Palpations: Abdomen is soft.     Tenderness: There is no abdominal tenderness. There is no guarding.  Musculoskeletal:     Cervical back: Neck supple.     Right lower leg: No edema.     Left lower leg: No edema.  Lymphadenopathy:     Cervical: No cervical adenopathy.  Skin:    General: Skin is warm and dry.  Neurological:     Mental Status: She is  alert.     Comments: No noted acute cognitive deficit. Sensation grossly intact to light touch in the extremities.   Grip strengths equal bilaterally.   Strength 5/5 in all extremities.  No gait disturbance.  Coordination intact.  Cranial nerves III-XII grossly intact.  Handles oral secretions without noted difficulty.  No noted phonation or speech deficit. No facial droop.   Psychiatric:        Mood and Affect: Mood and affect normal.        Speech: Speech normal.        Behavior: Behavior normal.    ED Results / Procedures / Treatments   Labs (all labs ordered are listed, but only abnormal results are displayed) Labs Reviewed - No data to display  EKG EKG Interpretation  Date/Time:  Thursday April 13 2021 14:39:22 EDT Ventricular Rate:  65 PR Interval:  134 QRS Duration: 78 QT Interval:  402 QTC Calculation: 418 R Axis:   -1 Text Interpretation: Normal sinus rhythm Minimal voltage criteria for LVH, may be normal variant ( R in aVL ) Nonspecific ST abnormality Abnormal ECG axis normal No acute ischemia Confirmed by Lorre Munroe ([id number]) on 04/13/2021 2:46:48 PM  Radiology No results found.  Procedures Procedures   Medications Ordered in ED Medications - No data to display  ED Course  I have reviewed the triage vital signs and the nursing notes.  Pertinent labs & imaging results that were available during my care of the patient were reviewed by me and considered in my medical decision making (see chart for details).    MDM Rules/Calculators/A&P                          Patient presents with previous complaint of maxillary pain bilaterally.  This improved  after she calmed down emotionally and took her blood pressure medication. My suspicion for a cardiac or chest related origin for this complaint is quite low. When we discussed next steps and further evaluation, patient states she feels better and would like to go. She also states her daughter recently had heart surgery  (congenital abnormaliteis) and would like to leave to check on her.   She will follow-up as an outpatient, as needed.   Final Clinical Impression(s) / ED Diagnoses Final diagnoses:  Facial pain    Rx / DC Orders ED Discharge Orders     None        Layla Maw 04/13/21 1933    Arnaldo Natal, MD 04/14/21 [phone]

## 2021-06-15 ENCOUNTER — Telehealth: Payer: Self-pay | Admitting: Hematology and Oncology

## 2021-06-15 NOTE — Telephone Encounter (Signed)
Received a new hem referral from Dr. Tamala Julian for [name]. Referral was sent on 8/10 but never created in the referral wq. [name] has been cld and scheduled to see Dr. Lorenso Courier on 8/26 at 9am. Pt aware to arrive 20 minutes earlyl.

## 2021-06-23 ENCOUNTER — Other Ambulatory Visit: Payer: Self-pay

## 2021-06-23 ENCOUNTER — Inpatient Hospital Stay: Payer: Medicaid Other | Attending: Hematology and Oncology | Admitting: Hematology and Oncology

## 2021-06-23 ENCOUNTER — Inpatient Hospital Stay: Payer: Medicaid Other

## 2021-06-23 VITALS — BP 126/60 | HR 65 | Temp 97.9°F | Resp 19 | Ht 62.0 in | Wt 225.7 lb

## 2021-06-23 DIAGNOSIS — Z8042 Family history of malignant neoplasm of prostate: Secondary | ICD-10-CM | POA: Diagnosis not present

## 2021-06-23 DIAGNOSIS — Z9884 Bariatric surgery status: Secondary | ICD-10-CM | POA: Insufficient documentation

## 2021-06-23 DIAGNOSIS — D508 Other iron deficiency anemias: Secondary | ICD-10-CM | POA: Diagnosis not present

## 2021-06-23 DIAGNOSIS — E119 Type 2 diabetes mellitus without complications: Secondary | ICD-10-CM | POA: Diagnosis not present

## 2021-06-23 DIAGNOSIS — D500 Iron deficiency anemia secondary to blood loss (chronic): Secondary | ICD-10-CM

## 2021-06-23 DIAGNOSIS — Z803 Family history of malignant neoplasm of breast: Secondary | ICD-10-CM

## 2021-06-23 DIAGNOSIS — I10 Essential (primary) hypertension: Secondary | ICD-10-CM | POA: Diagnosis not present

## 2021-06-23 LAB — CMP (CANCER CENTER ONLY)
ALT: 12 U/L (ref 0–44)
AST: 16 U/L (ref 15–41)
Albumin: 3.8 g/dL (ref 3.5–5.0)
Alkaline Phosphatase: 98 U/L (ref 38–126)
Anion gap: 7 (ref 5–15)
BUN: 15 mg/dL (ref 6–20)
CO2: 25 mmol/L (ref 22–32)
Calcium: 9.1 mg/dL (ref 8.9–10.3)
Chloride: 105 mmol/L (ref 98–111)
Creatinine: 0.73 mg/dL (ref 0.44–1.00)
GFR, Estimated: >60 mL/min (ref >60)
Glucose, Bld: 116 mg/dL — ABNORMAL HIGH (ref 70–99)
Potassium: 3.8 mmol/L (ref 3.5–5.1)
Sodium: 137 mmol/L (ref 135–145)
Total Bilirubin: 0.5 mg/dL (ref 0.3–1.2)
Total Protein: 7.3 g/dL (ref 6.5–8.1)

## 2021-06-23 LAB — CBC WITH DIFFERENTIAL (CANCER CENTER ONLY)
Abs Immature Granulocytes: 0.04 10*3/uL (ref 0.00–0.07)
Basophils Absolute: 0.1 10*3/uL (ref 0.0–0.1)
Basophils Relative: 1 %
Eosinophils Absolute: 0.2 10*3/uL (ref 0.0–0.5)
Eosinophils Relative: 2 %
HCT: 29.2 % — ABNORMAL LOW (ref 36.0–46.0)
Hemoglobin: 8.3 g/dL — ABNORMAL LOW (ref 12.0–15.0)
Immature Granulocytes: 1 %
Lymphocytes Relative: 24 %
Lymphs Abs: 1.7 10*3/uL (ref 0.7–4.0)
MCH: 18 pg — ABNORMAL LOW (ref 26.0–34.0)
MCHC: 28.4 g/dL — ABNORMAL LOW (ref 30.0–36.0)
MCV: 63.5 fL — ABNORMAL LOW (ref 80.0–100.0)
Monocytes Absolute: 0.9 10*3/uL (ref 0.1–1.0)
Monocytes Relative: 12 %
Neutro Abs: 4.4 10*3/uL (ref 1.7–7.7)
Neutrophils Relative %: 60 %
Platelet Count: 411 10*3/uL — ABNORMAL HIGH (ref 150–400)
RBC: 4.6 MIL/uL (ref 3.87–5.11)
RDW: 19.1 % — ABNORMAL HIGH (ref 11.5–15.5)
WBC Count: 7.2 10*3/uL (ref 4.0–10.5)
nRBC: 0 % (ref 0.0–0.2)

## 2021-06-23 LAB — IRON AND TIBC
Iron: 23 ug/dL — ABNORMAL LOW (ref 41–142)
Saturation Ratios: 4 % — ABNORMAL LOW (ref 21–57)
TIBC: 514 ug/dL — ABNORMAL HIGH (ref 236–444)
UIBC: 491 ug/dL — ABNORMAL HIGH (ref 120–384)

## 2021-06-23 LAB — FOLATE: Folate: 18.8 ng/mL (ref >5.9)

## 2021-06-23 LAB — RETIC PANEL
Immature Retic Fract: 24.3 % — ABNORMAL HIGH (ref 2.3–15.9)
RBC.: 4.61 MIL/uL (ref 3.87–5.11)
Retic Count, Absolute: 72.4 10*3/uL (ref 19.0–186.0)
Retic Ct Pct: 1.6 % (ref 0.4–3.1)
Reticulocyte Hemoglobin: 16.6 pg — ABNORMAL LOW (ref >27.9)

## 2021-06-23 LAB — FERRITIN: Ferritin: 5 ng/mL — ABNORMAL LOW (ref 11–307)

## 2021-06-23 LAB — VITAMIN B12: Vitamin B-12: 126 pg/mL — ABNORMAL LOW (ref 180–914)
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
## 2021-06-26 ENCOUNTER — Telehealth: Payer: Self-pay | Admitting: Hematology and Oncology

## 2021-06-26 ENCOUNTER — Other Ambulatory Visit: Payer: Self-pay | Admitting: Hematology and Oncology

## 2021-06-26 DIAGNOSIS — D509 Iron deficiency anemia, unspecified: Secondary | ICD-10-CM | POA: Insufficient documentation

## 2021-06-26 DIAGNOSIS — D508 Other iron deficiency anemias: Secondary | ICD-10-CM

## 2021-06-26 NOTE — Telephone Encounter (Signed)
Scheduled appointment per 08/29 sch msg. Patient is aware. 

## 2021-06-27 ENCOUNTER — Encounter: Payer: Self-pay | Admitting: Hematology and Oncology

## 2021-06-30 ENCOUNTER — Inpatient Hospital Stay: Payer: Medicaid Other | Attending: Hematology and Oncology

## 2021-06-30 ENCOUNTER — Other Ambulatory Visit: Payer: Self-pay

## 2021-06-30 VITALS — BP 158/91 | HR 52 | Temp 98.3°F | Resp 18

## 2021-06-30 DIAGNOSIS — N920 Excessive and frequent menstruation with regular cycle: Secondary | ICD-10-CM | POA: Insufficient documentation

## 2021-06-30 DIAGNOSIS — Z9884 Bariatric surgery status: Secondary | ICD-10-CM | POA: Insufficient documentation

## 2021-06-30 DIAGNOSIS — D500 Iron deficiency anemia secondary to blood loss (chronic): Secondary | ICD-10-CM | POA: Insufficient documentation

## 2021-06-30 DIAGNOSIS — D508 Other iron deficiency anemias: Secondary | ICD-10-CM

## 2021-06-30 MED ORDER — CYANOCOBALAMIN 1000 MCG/ML IJ SOLN
1000.0000 ug | Freq: Once | INTRAMUSCULAR | Status: AC
  Administered 2021-06-30: 1000 ug via INTRAMUSCULAR
  Filled 2021-06-30: qty 1

## 2021-06-30 MED ORDER — SODIUM CHLORIDE 0.9 % IV SOLN: Freq: Once | INTRAVENOUS | Status: AC

## 2021-06-30 MED ORDER — SODIUM CHLORIDE 0.9 % IV SOLN
200.0000 mg | Freq: Once | INTRAVENOUS | Status: AC
  Administered 2021-06-30: 200 mg via INTRAVENOUS
  Filled 2021-06-30: qty 200

## 2021-06-30 NOTE — Patient Instructions (Signed)

## 2021-06-30 NOTE — Progress Notes (Signed)
Pt stayed for 30 min observation post iron infusion. Pt BP elevated, but pt reports not taking home BP meds before treatment. Pt declines HA/dizziness/trouble breathing or any other complaints. Pt discharged in stable condition, educated on iron and instructed to call with any concerns.

## 2021-07-08 ENCOUNTER — Inpatient Hospital Stay: Payer: Medicaid Other

## 2021-07-08 ENCOUNTER — Other Ambulatory Visit: Payer: Self-pay

## 2021-07-08 VITALS — BP 167/73 | HR 54 | Temp 97.6°F | Resp 18

## 2021-07-08 DIAGNOSIS — D500 Iron deficiency anemia secondary to blood loss (chronic): Secondary | ICD-10-CM | POA: Diagnosis not present

## 2021-07-08 DIAGNOSIS — D508 Other iron deficiency anemias: Secondary | ICD-10-CM

## 2021-07-08 MED ORDER — SODIUM CHLORIDE 0.9 % IV SOLN: Freq: Once | INTRAVENOUS | Status: AC

## 2021-07-08 MED ORDER — SODIUM CHLORIDE 0.9 % IV SOLN
200.0000 mg | Freq: Once | INTRAVENOUS | Status: AC
  Administered 2021-07-08: 200 mg via INTRAVENOUS
  Filled 2021-07-08: qty 200

## 2021-07-14 ENCOUNTER — Other Ambulatory Visit: Payer: Self-pay

## 2021-07-14 ENCOUNTER — Inpatient Hospital Stay: Payer: Medicaid Other

## 2021-07-14 VITALS — BP 142/65 | HR 77 | Temp 98.1°F | Resp 16

## 2021-07-14 DIAGNOSIS — D500 Iron deficiency anemia secondary to blood loss (chronic): Secondary | ICD-10-CM | POA: Diagnosis not present

## 2021-07-14 DIAGNOSIS — D508 Other iron deficiency anemias: Secondary | ICD-10-CM

## 2021-07-14 MED ORDER — SODIUM CHLORIDE 0.9 % IV SOLN
200.0000 mg | Freq: Once | INTRAVENOUS | Status: AC
  Administered 2021-07-14: 200 mg via INTRAVENOUS
  Filled 2021-07-14: qty 200

## 2021-07-14 MED ORDER — SODIUM CHLORIDE 0.9 % IV SOLN: Freq: Once | INTRAVENOUS | Status: AC

## 2021-07-14 NOTE — Patient Instructions (Signed)

## 2021-07-16 ENCOUNTER — Other Ambulatory Visit: Payer: Self-pay

## 2021-07-16 ENCOUNTER — Encounter (HOSPITAL_COMMUNITY): Payer: Self-pay

## 2021-07-16 ENCOUNTER — Emergency Department (HOSPITAL_COMMUNITY)
Admission: EM | Admit: 2021-07-16 | Discharge: 2021-07-17 | Disposition: A | Discharge status: Home / Self Care | Payer: Medicaid Other | Attending: Emergency Medicine | Admitting: Emergency Medicine

## 2021-07-16 DIAGNOSIS — R1013 Epigastric pain: Secondary | ICD-10-CM | POA: Insufficient documentation

## 2021-07-16 DIAGNOSIS — R1110 Vomiting, unspecified: Secondary | ICD-10-CM | POA: Insufficient documentation

## 2021-07-16 DIAGNOSIS — Z5321 Procedure and treatment not carried out due to patient leaving prior to being seen by health care provider: Secondary | ICD-10-CM | POA: Diagnosis not present

## 2021-07-16 LAB — COMPREHENSIVE METABOLIC PANEL
ALT: 11 U/L (ref 0–44)
AST: 17 U/L (ref 15–41)
Albumin: 3.4 g/dL — ABNORMAL LOW (ref 3.5–5.0)
Alkaline Phosphatase: 72 U/L (ref 38–126)
Anion gap: 6 (ref 5–15)
BUN: 14 mg/dL (ref 6–20)
CO2: 24 mmol/L (ref 22–32)
Calcium: 8.6 mg/dL — ABNORMAL LOW (ref 8.9–10.3)
Chloride: 107 mmol/L (ref 98–111)
Creatinine, Ser: 0.77 mg/dL (ref 0.44–1.00)
GFR, Estimated: >60 mL/min (ref >60)
Glucose, Bld: 144 mg/dL — ABNORMAL HIGH (ref 70–99)
Potassium: 3.5 mmol/L (ref 3.5–5.1)
Sodium: 137 mmol/L (ref 135–145)
Total Bilirubin: 0.3 mg/dL (ref 0.3–1.2)
Total Protein: 6.6 g/dL (ref 6.5–8.1)

## 2021-07-16 LAB — CBC WITH DIFFERENTIAL/PLATELET
Abs Immature Granulocytes: 0.04 10*3/uL (ref 0.00–0.07)
Basophils Absolute: 0.1 10*3/uL (ref 0.0–0.1)
Basophils Relative: 1 %
Eosinophils Absolute: 0.2 10*3/uL (ref 0.0–0.5)
Eosinophils Relative: 3 %
HCT: 32.1 % — ABNORMAL LOW (ref 36.0–46.0)
Hemoglobin: 8.9 g/dL — ABNORMAL LOW (ref 12.0–15.0)
Immature Granulocytes: 1 %
Lymphocytes Relative: 23 %
Lymphs Abs: 1.6 10*3/uL (ref 0.7–4.0)
MCH: 19.8 pg — ABNORMAL LOW (ref 26.0–34.0)
MCHC: 27.7 g/dL — ABNORMAL LOW (ref 30.0–36.0)
MCV: 71.5 fL — ABNORMAL LOW (ref 80.0–100.0)
Monocytes Absolute: 0.6 10*3/uL (ref 0.1–1.0)
Monocytes Relative: 9 %
Neutro Abs: 4.4 10*3/uL (ref 1.7–7.7)
Neutrophils Relative %: 63 %
Platelets: 427 10*3/uL — ABNORMAL HIGH (ref 150–400)
RBC: 4.49 MIL/uL (ref 3.87–5.11)
RDW: 24.9 % — ABNORMAL HIGH (ref 11.5–15.5)
WBC: 6.9 10*3/uL (ref 4.0–10.5)
nRBC: 0 % (ref 0.0–0.2)

## 2021-07-16 LAB — LIPASE, BLOOD: Lipase: 25 U/L (ref 11–51)

## 2021-07-16 NOTE — ED Triage Notes (Signed)
Pt reports that today she has been having epigastric pain, radiates to her back, vomited x 3

## 2021-07-16 NOTE — ED Provider Notes (Signed)
Emergency Medicine Provider Triage Evaluation Note  [name] , a 45 y.o. female  was evaluated in triage.  Pt complains of epigastric abdominal pain since approximately 1 or 2 hours ago. She has a past surgical history notable for C-section and gastric bypass has a history of H. pylori, reflux, DM2, depression, HTN, PCOS  She says that she has abdominal pain sometimes but not as severe as this.  She states that before her pain began she had eaten some food including mango, rice, fish.  States that she tried to force herself to vomit but was unsuccessful.  No fevers or chills.  Has had a bowel movement since her pain began.  No urinary frequency urgency or dysuria or hematuria  Review of Systems  Positive: Epigastric abdominal pain Negative: Fever  Physical Exam  There were no vitals taken for this visit. Gen:   Awake, conversant Resp:  Normal effort  MSK:   Moves extremities without difficulty  Other:  Abdomen is soft nontender except for some discomfort in the epigastrium with deep palpation.  Medical Decision Making  Medically screening exam initiated at 9:06 PM.  Appropriate orders placed.  Anastazia Spinola Coreas was informed that the remainder of the evaluation will be completed by another provider, this initial triage assessment does not replace that evaluation, and the importance of remaining in the ED until their evaluation is complete.  Will obtain a basic abdominal lab work-up and acute chest abdomen to ensure no perforation which I have low suspicion for.   Tedd Sias, [place] 07/16/21 2112    Hayden Rasmussen, MD 07/17/21 1013

## 2021-07-17 NOTE — ED Notes (Signed)
PT called X3 for vitals and for room with no response

## 2021-07-21 ENCOUNTER — Inpatient Hospital Stay: Payer: Medicaid Other

## 2021-07-21 ENCOUNTER — Other Ambulatory Visit: Payer: Self-pay

## 2021-07-21 VITALS — BP 185/79 | HR 76 | Temp 98.3°F

## 2021-07-21 DIAGNOSIS — D500 Iron deficiency anemia secondary to blood loss (chronic): Secondary | ICD-10-CM | POA: Diagnosis not present

## 2021-07-21 DIAGNOSIS — D508 Other iron deficiency anemias: Secondary | ICD-10-CM

## 2021-07-21 MED ORDER — SODIUM CHLORIDE 0.9 % IV SOLN
200.0000 mg | Freq: Once | INTRAVENOUS | Status: AC
  Administered 2021-07-21: 200 mg via INTRAVENOUS
  Filled 2021-07-21: qty 200

## 2021-07-21 MED ORDER — SODIUM CHLORIDE 0.9 % IV SOLN: Freq: Once | INTRAVENOUS | Status: AC

## 2021-07-21 NOTE — Patient Instructions (Signed)

## 2021-08-15 ENCOUNTER — Telehealth: Payer: Self-pay | Admitting: Hematology and Oncology

## 2021-08-15 NOTE — Telephone Encounter (Signed)
Rescheduled 10/21 appointment to 10/19 due to provider pal, patient has been called and notified.

## 2021-08-16 ENCOUNTER — Other Ambulatory Visit: Payer: Self-pay | Admitting: Hematology and Oncology

## 2021-08-16 ENCOUNTER — Other Ambulatory Visit: Payer: Self-pay

## 2021-08-16 ENCOUNTER — Inpatient Hospital Stay: Payer: Medicaid Other | Attending: Hematology and Oncology

## 2021-08-16 ENCOUNTER — Inpatient Hospital Stay (HOSPITAL_BASED_OUTPATIENT_CLINIC_OR_DEPARTMENT_OTHER): Payer: Medicaid Other | Admitting: Hematology and Oncology

## 2021-08-16 ENCOUNTER — Inpatient Hospital Stay: Payer: Medicaid Other

## 2021-08-16 VITALS — BP 144/72 | HR 61 | Temp 97.2°F | Resp 17 | Wt 231.7 lb

## 2021-08-16 DIAGNOSIS — D508 Other iron deficiency anemias: Secondary | ICD-10-CM | POA: Diagnosis not present

## 2021-08-16 DIAGNOSIS — D500 Iron deficiency anemia secondary to blood loss (chronic): Secondary | ICD-10-CM

## 2021-08-16 DIAGNOSIS — Z79899 Other long term (current) drug therapy: Secondary | ICD-10-CM | POA: Insufficient documentation

## 2021-08-16 DIAGNOSIS — Z9884 Bariatric surgery status: Secondary | ICD-10-CM | POA: Diagnosis not present

## 2021-08-16 DIAGNOSIS — N920 Excessive and frequent menstruation with regular cycle: Secondary | ICD-10-CM | POA: Diagnosis present

## 2021-08-16 LAB — CMP (CANCER CENTER ONLY)
ALT: 11 U/L (ref 0–44)
AST: 14 U/L — ABNORMAL LOW (ref 15–41)
Albumin: 3.8 g/dL (ref 3.5–5.0)
Alkaline Phosphatase: 95 U/L (ref 38–126)
Anion gap: 11 (ref 5–15)
BUN: 17 mg/dL (ref 6–20)
CO2: 25 mmol/L (ref 22–32)
Calcium: 9 mg/dL (ref 8.9–10.3)
Chloride: 104 mmol/L (ref 98–111)
Creatinine: 0.83 mg/dL (ref 0.44–1.00)
GFR, Estimated: >60 mL/min (ref >60)
Glucose, Bld: 96 mg/dL (ref 70–99)
Potassium: 3.8 mmol/L (ref 3.5–5.1)
Sodium: 140 mmol/L (ref 135–145)
Total Bilirubin: <0.2 mg/dL — ABNORMAL LOW (ref 0.3–1.2)
Total Protein: 7.3 g/dL (ref 6.5–8.1)

## 2021-08-16 LAB — RETICULOCYTES
Immature Retic Fract: 22.8 % — ABNORMAL HIGH (ref 2.3–15.9)
RBC.: 4.65 MIL/uL (ref 3.87–5.11)
Retic Count, Absolute: 52.5 10*3/uL (ref 19.0–186.0)
Retic Ct Pct: 1.1 % (ref 0.4–3.1)

## 2021-08-16 LAB — CBC WITH DIFFERENTIAL (CANCER CENTER ONLY)
Abs Immature Granulocytes: 0.03 10*3/uL (ref 0.00–0.07)
Basophils Absolute: 0.1 10*3/uL (ref 0.0–0.1)
Basophils Relative: 1 %
Eosinophils Absolute: 0.3 10*3/uL (ref 0.0–0.5)
Eosinophils Relative: 4 %
HCT: 33.8 % — ABNORMAL LOW (ref 36.0–46.0)
Hemoglobin: 10.2 g/dL — ABNORMAL LOW (ref 12.0–15.0)
Immature Granulocytes: 0 %
Lymphocytes Relative: 27 %
Lymphs Abs: 2.4 10*3/uL (ref 0.7–4.0)
MCH: 21.8 pg — ABNORMAL LOW (ref 26.0–34.0)
MCHC: 30.2 g/dL (ref 30.0–36.0)
MCV: 72.2 fL — ABNORMAL LOW (ref 80.0–100.0)
Monocytes Absolute: 0.6 10*3/uL (ref 0.1–1.0)
Monocytes Relative: 7 %
Neutro Abs: 5.4 10*3/uL (ref 1.7–7.7)
Neutrophils Relative %: 61 %
Platelet Count: 375 10*3/uL (ref 150–400)
RBC: 4.68 MIL/uL (ref 3.87–5.11)
RDW: 25.8 % — ABNORMAL HIGH (ref 11.5–15.5)
WBC Count: 8.8 10*3/uL (ref 4.0–10.5)
nRBC: 0 % (ref 0.0–0.2)

## 2021-08-16 LAB — IRON AND TIBC
Iron: 27 ug/dL — ABNORMAL LOW (ref 41–142)
Saturation Ratios: 6 % — ABNORMAL LOW (ref 21–57)
TIBC: 433 ug/dL (ref 236–444)
UIBC: 406 ug/dL — ABNORMAL HIGH (ref 120–384)

## 2021-08-16 LAB — FERRITIN: Ferritin: 13 ng/mL (ref 11–307)

## 2021-08-16 MED ORDER — CYANOCOBALAMIN 1000 MCG/ML IJ SOLN
1000.0000 ug | Freq: Once | INTRAMUSCULAR | Status: AC
  Administered 2021-08-16: 1000 ug via INTRAMUSCULAR
  Filled 2021-08-16: qty 1

## 2021-08-16 NOTE — Patient Instructions (Signed)

## 2021-08-18 ENCOUNTER — Other Ambulatory Visit: Payer: Medicaid Other

## 2021-08-18 ENCOUNTER — Ambulatory Visit: Payer: Medicaid Other | Admitting: Hematology and Oncology

## 2021-08-18 ENCOUNTER — Ambulatory Visit: Payer: Medicaid Other

## 2021-08-22 ENCOUNTER — Encounter: Payer: Self-pay | Admitting: Hematology and Oncology
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
## 2021-08-24 ENCOUNTER — Ambulatory Visit
Admission: RE | Admit: 2021-08-24 | Discharge: 2021-08-24 | Disposition: A | Discharge status: Home / Self Care | Payer: Medicaid Other | Source: Ambulatory Visit | Attending: Family Medicine | Admitting: Family Medicine

## 2021-08-24 ENCOUNTER — Other Ambulatory Visit: Payer: Self-pay | Admitting: Family Medicine

## 2021-08-24 ENCOUNTER — Other Ambulatory Visit: Payer: Self-pay

## 2021-08-24 DIAGNOSIS — Z1231 Encounter for screening mammogram for malignant neoplasm of breast: Secondary | ICD-10-CM

## 2021-08-25 ENCOUNTER — Ambulatory Visit: Payer: Medicaid Other | Admitting: Hematology and Oncology

## 2021-08-25 ENCOUNTER — Ambulatory Visit: Payer: Medicaid Other

## 2021-08-25 ENCOUNTER — Other Ambulatory Visit: Payer: Medicaid Other

## 2021-09-07 ENCOUNTER — Other Ambulatory Visit: Payer: Self-pay

## 2021-09-07 ENCOUNTER — Inpatient Hospital Stay: Payer: Medicaid Other | Attending: Hematology and Oncology

## 2021-09-07 VITALS — BP 158/84 | HR 60 | Temp 99.1°F | Resp 18

## 2021-09-07 DIAGNOSIS — D500 Iron deficiency anemia secondary to blood loss (chronic): Secondary | ICD-10-CM | POA: Insufficient documentation

## 2021-09-07 DIAGNOSIS — D508 Other iron deficiency anemias: Secondary | ICD-10-CM

## 2021-09-07 DIAGNOSIS — N920 Excessive and frequent menstruation with regular cycle: Secondary | ICD-10-CM | POA: Insufficient documentation

## 2021-09-07 MED ORDER — SODIUM CHLORIDE 0.9 % IV SOLN
200.0000 mg | Freq: Once | INTRAVENOUS | Status: AC
  Administered 2021-09-07: 200 mg via INTRAVENOUS
  Filled 2021-09-07: qty 200

## 2021-09-07 MED ORDER — SODIUM CHLORIDE 0.9 % IV SOLN: Freq: Once | INTRAVENOUS | Status: AC

## 2021-09-07 NOTE — Patient Instructions (Signed)

## 2021-09-14 ENCOUNTER — Inpatient Hospital Stay: Payer: Medicaid Other

## 2021-09-14 ENCOUNTER — Other Ambulatory Visit: Payer: Self-pay

## 2021-09-14 VITALS — BP 146/81 | HR 60 | Temp 98.8°F | Resp 16

## 2021-09-14 DIAGNOSIS — D500 Iron deficiency anemia secondary to blood loss (chronic): Secondary | ICD-10-CM | POA: Diagnosis not present

## 2021-09-14 DIAGNOSIS — D508 Other iron deficiency anemias: Secondary | ICD-10-CM

## 2021-09-14 MED ORDER — SODIUM CHLORIDE 0.9 % IV SOLN: Freq: Once | INTRAVENOUS | Status: AC

## 2021-09-14 MED ORDER — SODIUM CHLORIDE 0.9 % IV SOLN
200.0000 mg | Freq: Once | INTRAVENOUS | Status: AC
  Administered 2021-09-14: 15:00:00 200 mg via INTRAVENOUS
  Filled 2021-09-14: qty 200

## 2021-09-14 NOTE — Patient Instructions (Signed)

## 2021-09-20 ENCOUNTER — Inpatient Hospital Stay: Payer: Medicaid Other

## 2021-09-20 ENCOUNTER — Other Ambulatory Visit: Payer: Self-pay

## 2021-09-20 VITALS — BP 174/83 | HR 80 | Temp 98.6°F | Resp 16

## 2021-09-20 DIAGNOSIS — D508 Other iron deficiency anemias: Secondary | ICD-10-CM

## 2021-09-20 DIAGNOSIS — D500 Iron deficiency anemia secondary to blood loss (chronic): Secondary | ICD-10-CM | POA: Diagnosis not present

## 2021-09-20 MED ORDER — SODIUM CHLORIDE 0.9 % IV SOLN
200.0000 mg | Freq: Once | INTRAVENOUS | Status: AC
  Administered 2021-09-20: 200 mg via INTRAVENOUS
  Filled 2021-09-20: qty 200

## 2021-09-20 MED ORDER — SODIUM CHLORIDE 0.9 % IV SOLN
Freq: Once | INTRAVENOUS | Status: AC
Start: 2021-09-20 — End: 2021-09-20

## 2021-09-20 NOTE — Patient Instructions (Signed)
[facility] MEDICAL [facility]COLOGY  Discharge Instructions: Thank you for choosing [facility] to provide your oncology and hematology care.   If you have a lab appointment with the Cancer Center, please go directly to the Cancer Center and check in at the registration area.   Wear comfortable clothing and clothing appropriate for easy access to any Portacath or PICC line.   We strive to give you quality time with your provider. You may need to reschedule your appointment if you arrive late (15 or more minutes).  Arriving late affects you and other patients whose appointments are after yours.  Also, if you miss three or more appointments without notifying the office, you may be dismissed from the clinic at the provider's discretion.      For prescription refill requests, have your pharmacy contact our office and allow 72 hours for refills to be completed.       To help prevent nausea and vomiting after your treatment, we encourage you to take your nausea medication as directed.  BELOW ARE SYMPTOMS THAT SHOULD BE REPORTED IMMEDIATELY: *FEVER GREATER THAN 100.4 F (38 C) OR HIGHER *CHILLS OR SWEATING *NAUSEA AND VOMITING THAT IS NOT CONTROLLED WITH YOUR NAUSEA MEDICATION *UNUSUAL SHORTNESS OF BREATH *UNUSUAL BRUISING OR BLEEDING *URINARY PROBLEMS (pain or burning when urinating, or frequent urination) *BOWEL PROBLEMS (unusual diarrhea, constipation, pain near the anus) TENDERNESS IN MOUTH AND THROAT WITH OR WITHOUT PRESENCE OF ULCERS (sore throat, sores in mouth, or a toothache) UNUSUAL RASH, SWELLING OR PAIN  UNUSUAL VAGINAL DISCHARGE OR ITCHING   Items with * indicate a potential emergency and should be followed up as soon as possible or go to the Emergency Department if any problems should occur.  Please show the CHEMOTHERAPY ALERT CARD or IMMUNOTHERAPY ALERT CARD at check-in to the Emergency Department and triage nurse.  Should you have questions after your  visit or need to cancel or reschedule your appointment, please contact [facility] MEDICAL [facility]COL[facility]  Dept: [phone]  and follow the prompts.  Office hours are 8:00 a.m. to 4:30 p.m. Monday - Friday. Please note that voicemails left after 4:00 p.m. may not be returned until the following business day.  We are closed weekends and major holidays. You have access to a nurse at all times for urgent questions. Please call the main number to the clinic Dept: [phone] and follow the prompts.   For any non-urgent questions, you may also contact your provider using MyChart. We now offer e-Visits for anyone 18 and older to request care online for non-urgent symptoms. For details visit [email or web address].   Also download the MyChart app! Go to the app store, search "MyChart", open the app, select , and log in with your MyChart username and password.  Due to Covid, a mask is required upon entering the hospital/clinic. If you do not have a mask, one will be given to you upon arrival. For doctor visits, patients may have 1 support person aged 18 or older with them. For treatment visits, patients cannot have anyone with them due to current Covid guidelines and our immunocompromised population.   

## 2021-09-28 ENCOUNTER — Other Ambulatory Visit: Payer: Self-pay

## 2021-09-28 ENCOUNTER — Inpatient Hospital Stay: Payer: Medicaid Other | Attending: Hematology and Oncology

## 2021-09-28 VITALS — BP 161/82 | HR 56 | Temp 99.0°F | Resp 18

## 2021-09-28 DIAGNOSIS — D500 Iron deficiency anemia secondary to blood loss (chronic): Secondary | ICD-10-CM | POA: Diagnosis not present

## 2021-09-28 DIAGNOSIS — D508 Other iron deficiency anemias: Secondary | ICD-10-CM

## 2021-09-28 MED ORDER — SODIUM CHLORIDE 0.9 % IV SOLN: Freq: Once | INTRAVENOUS | Status: AC

## 2021-09-28 MED ORDER — SODIUM CHLORIDE 0.9 % IV SOLN
200.0000 mg | Freq: Once | INTRAVENOUS | Status: AC
  Administered 2021-09-28: 200 mg via INTRAVENOUS
  Filled 2021-09-28: qty 200

## 2021-09-28 NOTE — Patient Instructions (Signed)

## 2021-09-28 NOTE — Progress Notes (Signed)
Pt states she is not staying to be monitored for 30 mins.  She reports feeling fine.  She states she has to get her daughter.

## 2021-10-04 ENCOUNTER — Encounter (HOSPITAL_COMMUNITY): Payer: Self-pay | Admitting: *Deleted

## 2021-10-05 ENCOUNTER — Inpatient Hospital Stay: Payer: Medicaid Other

## 2021-10-05 ENCOUNTER — Other Ambulatory Visit: Payer: Self-pay

## 2021-10-05 VITALS — BP 155/81 | HR 58 | Temp 98.4°F | Resp 16 | Ht 62.0 in | Wt 229.0 lb

## 2021-10-05 DIAGNOSIS — D508 Other iron deficiency anemias: Secondary | ICD-10-CM

## 2021-10-05 DIAGNOSIS — D500 Iron deficiency anemia secondary to blood loss (chronic): Secondary | ICD-10-CM | POA: Diagnosis not present

## 2021-10-05 MED ORDER — SODIUM CHLORIDE 0.9 % IV SOLN: Freq: Once | INTRAVENOUS | Status: AC

## 2021-10-05 MED ORDER — SODIUM CHLORIDE 0.9 % IV SOLN
200.0000 mg | Freq: Once | INTRAVENOUS | Status: AC
  Administered 2021-10-05: 200 mg via INTRAVENOUS
  Filled 2021-10-05: qty 200

## 2021-10-05 NOTE — Progress Notes (Signed)
Pt observed for 15 minutes post Venofer infusion.  Tolerated treatment well without incident.  VSS at discharge.  Ambulatory to lobby.

## 2021-10-05 NOTE — Patient Instructions (Signed)

## 2021-10-12 ENCOUNTER — Ambulatory Visit: Payer: Medicaid Other

## 2021-10-26 ENCOUNTER — Other Ambulatory Visit: Payer: Self-pay | Admitting: Obstetrics and Gynecology

## 2021-10-26 DIAGNOSIS — Z01818 Encounter for other preprocedural examination: Secondary | ICD-10-CM

## 2021-11-02 ENCOUNTER — Encounter (HOSPITAL_BASED_OUTPATIENT_CLINIC_OR_DEPARTMENT_OTHER): Payer: Self-pay | Admitting: Obstetrics and Gynecology

## 2021-11-02 ENCOUNTER — Other Ambulatory Visit: Payer: Medicaid Other

## 2021-11-02 ENCOUNTER — Ambulatory Visit: Payer: Medicaid Other | Admitting: Hematology and Oncology

## 2021-11-02 ENCOUNTER — Other Ambulatory Visit: Payer: Self-pay

## 2021-11-02 NOTE — Progress Notes (Signed)
Spoke w/ via phone for pre-op interview--- [facility] needs dos----    EKG, ISTAT, CBC and UPT.           Lab results------ COVID test -----patient states asymptomatic no test needed Arrive at -------1020 NPO after MN NO Solid Food.  Clear liquids from MN until--- Med rec completed Medications to take morning of surgery ----- Synthroid Diabetic medication ----- Patient instructed no nail polish to be worn day of surgery Patient instructed to bring photo id and insurance card day of surgery Patient aware to have Driver (ride ) / caregiver    for 24 hours after surgery  Patient Special Instructions ----- Pre-Op special Istructions ----- Patient verbalized understanding of instructions that were given at this phone interview. Patient denies shortness of breath, chest pain, fever, cough at this phone interview.
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
## 2021-11-08 ENCOUNTER — Encounter (HOSPITAL_BASED_OUTPATIENT_CLINIC_OR_DEPARTMENT_OTHER): Payer: Self-pay | Admitting: Obstetrics and Gynecology

## 2021-11-08 ENCOUNTER — Ambulatory Visit (HOSPITAL_BASED_OUTPATIENT_CLINIC_OR_DEPARTMENT_OTHER): Payer: Medicaid Other | Admitting: Anesthesiology

## 2021-11-08 ENCOUNTER — Encounter (HOSPITAL_BASED_OUTPATIENT_CLINIC_OR_DEPARTMENT_OTHER)
Admission: RE | Disposition: A | Discharge status: Home / Self Care | Payer: Self-pay | Source: Home / Self Care | Attending: Obstetrics and Gynecology

## 2021-11-08 ENCOUNTER — Ambulatory Visit (HOSPITAL_BASED_OUTPATIENT_CLINIC_OR_DEPARTMENT_OTHER)
Admission: RE | Admit: 2021-11-08 | Discharge: 2021-11-08 | Disposition: A | Discharge status: Home / Self Care | Payer: Medicaid Other | Attending: Obstetrics and Gynecology | Admitting: Obstetrics and Gynecology

## 2021-11-08 DIAGNOSIS — E039 Hypothyroidism, unspecified: Secondary | ICD-10-CM | POA: Diagnosis not present

## 2021-11-08 DIAGNOSIS — G4730 Sleep apnea, unspecified: Secondary | ICD-10-CM | POA: Diagnosis not present

## 2021-11-08 DIAGNOSIS — F32A Depression, unspecified: Secondary | ICD-10-CM | POA: Insufficient documentation

## 2021-11-08 DIAGNOSIS — N920 Excessive and frequent menstruation with regular cycle: Secondary | ICD-10-CM | POA: Diagnosis present

## 2021-11-08 DIAGNOSIS — D649 Anemia, unspecified: Secondary | ICD-10-CM | POA: Insufficient documentation

## 2021-11-08 DIAGNOSIS — Z6841 Body Mass Index (BMI) 40.0 and over, adult: Secondary | ICD-10-CM | POA: Insufficient documentation

## 2021-11-08 DIAGNOSIS — Z01818 Encounter for other preprocedural examination: Secondary | ICD-10-CM

## 2021-11-08 DIAGNOSIS — I10 Essential (primary) hypertension: Secondary | ICD-10-CM | POA: Insufficient documentation

## 2021-11-08 DIAGNOSIS — Z9851 Tubal ligation status: Secondary | ICD-10-CM | POA: Insufficient documentation

## 2021-11-08 DIAGNOSIS — F419 Anxiety disorder, unspecified: Secondary | ICD-10-CM | POA: Insufficient documentation

## 2021-11-08 HISTORY — PX: DILITATION & CURRETTAGE/HYSTROSCOPY WITH NOVASURE ABLATION: SHX5568

## 2021-11-08 LAB — CBC
HCT: 40.8 % (ref 36.0–46.0)
Hemoglobin: 13 g/dL (ref 12.0–15.0)
MCH: 28.1 pg (ref 26.0–34.0)
MCHC: 31.9 g/dL (ref 30.0–36.0)
MCV: 88.3 fL (ref 80.0–100.0)
Platelets: 323 10*3/uL (ref 150–400)
RBC: 4.62 MIL/uL (ref 3.87–5.11)
RDW: 19 % — ABNORMAL HIGH (ref 11.5–15.5)
WBC: 5.6 10*3/uL (ref 4.0–10.5)
nRBC: 0 % (ref 0.0–0.2)

## 2021-11-08 LAB — POCT PREGNANCY, URINE: Preg Test, Ur: NEGATIVE

## 2021-11-08 LAB — GLUCOSE, CAPILLARY: Glucose-Capillary: 85 mg/dL (ref 70–99)

## 2021-11-08 SURGERY — DILATATION & CURETTAGE/HYSTEROSCOPY WITH NOVASURE ABLATION
Anesthesia: General | Site: Vagina

## 2021-11-08 MED ORDER — SCOPOLAMINE 1 MG/3DAYS TD PT72
1.0000 | MEDICATED_PATCH | TRANSDERMAL | Status: DC
  Administered 2021-11-08: 1.5 mg via TRANSDERMAL

## 2021-11-08 MED ORDER — LIDOCAINE 2% (20 MG/ML) 5 ML SYRINGE
INTRAMUSCULAR | Status: AC
  Filled 2021-11-08: qty 5

## 2021-11-08 MED ORDER — DEXAMETHASONE SODIUM PHOSPHATE 4 MG/ML IJ SOLN
INTRAMUSCULAR | Status: DC | PRN
  Administered 2021-11-08: 10 mg via INTRAVENOUS

## 2021-11-08 MED ORDER — DEXAMETHASONE SODIUM PHOSPHATE 10 MG/ML IJ SOLN
INTRAMUSCULAR | Status: AC
  Filled 2021-11-08: qty 1

## 2021-11-08 MED ORDER — LACTATED RINGERS IV SOLN: INTRAVENOUS | Status: DC

## 2021-11-08 MED ORDER — ROCURONIUM BROMIDE 10 MG/ML (PF) SYRINGE
PREFILLED_SYRINGE | INTRAVENOUS | Status: AC
  Filled 2021-11-08: qty 10

## 2021-11-08 MED ORDER — POVIDONE-IODINE 10 % EX SWAB: 2.0000 "application " | Freq: Once | CUTANEOUS | Status: DC

## 2021-11-08 MED ORDER — LIDOCAINE 2% (20 MG/ML) 5 ML SYRINGE
INTRAMUSCULAR | Status: DC | PRN
  Administered 2021-11-08: 100 mg via INTRAVENOUS

## 2021-11-08 MED ORDER — MIDAZOLAM HCL 2 MG/2ML IJ SOLN
INTRAMUSCULAR | Status: AC
  Filled 2021-11-08: qty 2

## 2021-11-08 MED ORDER — SOD CITRATE-CITRIC ACID 500-334 MG/5ML PO SOLN: 30.0000 mL | ORAL | Status: DC

## 2021-11-08 MED ORDER — OXYCODONE HCL 5 MG PO TABS
ORAL_TABLET | ORAL | Status: AC
  Filled 2021-11-08: qty 1

## 2021-11-08 MED ORDER — ACETAMINOPHEN 500 MG PO TABS
1000.0000 mg | ORAL_TABLET | Freq: Once | ORAL | Status: AC
  Administered 2021-11-08: 1000 mg via ORAL

## 2021-11-08 MED ORDER — SODIUM CHLORIDE 0.9 % IR SOLN
Status: DC | PRN
  Administered 2021-11-08: 900 mL

## 2021-11-08 MED ORDER — DEXMEDETOMIDINE (PRECEDEX) IN NS 20 MCG/5ML (4 MCG/ML) IV SYRINGE
PREFILLED_SYRINGE | INTRAVENOUS | Status: AC
  Filled 2021-11-08: qty 5

## 2021-11-08 MED ORDER — FENTANYL CITRATE (PF) 100 MCG/2ML IJ SOLN: 25.0000 ug | INTRAMUSCULAR | Status: DC | PRN

## 2021-11-08 MED ORDER — PROPOFOL 10 MG/ML IV BOLUS
INTRAVENOUS | Status: DC | PRN
  Administered 2021-11-08: 100 mg via INTRAVENOUS
  Administered 2021-11-08: 200 mg via INTRAVENOUS

## 2021-11-08 MED ORDER — FENTANYL CITRATE (PF) 100 MCG/2ML IJ SOLN
INTRAMUSCULAR | Status: DC | PRN
Start: 2021-11-08 — End: 2021-11-08
  Administered 2021-11-08 (×4): 25 ug via INTRAVENOUS

## 2021-11-08 MED ORDER — PROPOFOL 10 MG/ML IV BOLUS
INTRAVENOUS | Status: AC
  Filled 2021-11-08: qty 20

## 2021-11-08 MED ORDER — FENTANYL CITRATE (PF) 100 MCG/2ML IJ SOLN
INTRAMUSCULAR | Status: AC
  Filled 2021-11-08: qty 2

## 2021-11-08 MED ORDER — ACETAMINOPHEN 500 MG PO TABS
ORAL_TABLET | ORAL | Status: AC
  Filled 2021-11-08: qty 2

## 2021-11-08 MED ORDER — MIDAZOLAM HCL 5 MG/5ML IJ SOLN
INTRAMUSCULAR | Status: DC | PRN
  Administered 2021-11-08: 2 mg via INTRAVENOUS

## 2021-11-08 MED ORDER — AMISULPRIDE (ANTIEMETIC) 5 MG/2ML IV SOLN: 10.0000 mg | Freq: Once | INTRAVENOUS | Status: DC | PRN

## 2021-11-08 MED ORDER — PROMETHAZINE HCL 25 MG/ML IJ SOLN: 6.2500 mg | INTRAMUSCULAR | Status: DC | PRN

## 2021-11-08 MED ORDER — ONDANSETRON HCL 4 MG/2ML IJ SOLN
INTRAMUSCULAR | Status: AC
  Filled 2021-11-08: qty 2

## 2021-11-08 MED ORDER — SCOPOLAMINE 1 MG/3DAYS TD PT72
MEDICATED_PATCH | TRANSDERMAL | Status: AC
  Filled 2021-11-08: qty 1

## 2021-11-08 MED ORDER — OXYCODONE HCL 5 MG/5ML PO SOLN: 5.0000 mg | Freq: Once | ORAL | Status: AC | PRN

## 2021-11-08 MED ORDER — KETOROLAC TROMETHAMINE 30 MG/ML IJ SOLN
INTRAMUSCULAR | Status: DC | PRN
  Administered 2021-11-08: 30 mg via INTRAVENOUS

## 2021-11-08 MED ORDER — ONDANSETRON HCL 4 MG/2ML IJ SOLN
INTRAMUSCULAR | Status: DC | PRN
Start: 2021-11-08 — End: 2021-11-08
  Administered 2021-11-08: 4 mg via INTRAVENOUS

## 2021-11-08 MED ORDER — OXYCODONE HCL 5 MG PO TABS
5.0000 mg | ORAL_TABLET | Freq: Once | ORAL | Status: AC | PRN
  Administered 2021-11-08: 5 mg via ORAL

## 2021-11-08 MED ORDER — KETOROLAC TROMETHAMINE 30 MG/ML IJ SOLN
INTRAMUSCULAR | Status: AC
  Filled 2021-11-08: qty 1

## 2021-11-08 SURGICAL SUPPLY — 21 items
ABLATOR SURESOUND NOVASURE (ABLATOR) ×1 IMPLANT
BIPOLAR CUTTING LOOP 21FR (ELECTRODE)
CATH ROBINSON RED A/P 16FR (CATHETERS) ×2 IMPLANT
DILATOR CANAL MILEX (MISCELLANEOUS) IMPLANT
DRSG TELFA 3X8 NADH (GAUZE/BANDAGES/DRESSINGS) ×2 IMPLANT
ELECT REM PT RETURN 9FT ADLT (ELECTROSURGICAL)
ELECTRODE REM PT RTRN 9FT ADLT (ELECTROSURGICAL) IMPLANT
GAUZE 4X4 16PLY ~~LOC~~+RFID DBL (SPONGE) ×2 IMPLANT
GLOVE SURG ENC MOIS LTX SZ7 (GLOVE) ×2 IMPLANT
GOWN STRL REUS W/TWL XL LVL3 (GOWN DISPOSABLE) ×2 IMPLANT
IV NS IRRIG 3000ML ARTHROMATIC (IV SOLUTION) ×1 IMPLANT
KIT PROCEDURE FLUENT (KITS) ×2 IMPLANT
KIT TURNOVER CYSTO (KITS) ×2 IMPLANT
LOOP CUTTING BIPOLAR 21FR (ELECTRODE) IMPLANT
NS IRRIG 500ML POUR BTL (IV SOLUTION) IMPLANT
PACK VAGINAL MINOR WOMEN LF (CUSTOM PROCEDURE TRAY) ×2 IMPLANT
PAD DRESSING TELFA 3X8 NADH (GAUZE/BANDAGES/DRESSINGS) ×1 IMPLANT
PAD OB MATERNITY 4.3X12.25 (PERSONAL CARE ITEMS) ×2 IMPLANT
SEAL ROD LENS SCOPE MYOSURE (ABLATOR) ×4 IMPLANT
TOWEL OR 17X26 10 PK STRL BLUE (TOWEL DISPOSABLE) ×2 IMPLANT
WATER STERILE IRR 500ML POUR (IV SOLUTION) ×1 IMPLANT

## 2021-11-08 NOTE — Anesthesia Procedure Notes (Signed)
Procedure Name: LMA Insertion Date/Time: 11/08/2021 12:28 PM Performed by: Justice Rocher, CRNA Pre-anesthesia Checklist: Patient identified, Emergency Drugs available, Suction available, Patient being monitored and Timeout performed Patient Re-evaluated:Patient Re-evaluated prior to induction Oxygen Delivery Method: Circle system utilized Preoxygenation: Pre-oxygenation with 100% oxygen Induction Type: IV induction Ventilation: Mask ventilation without difficulty LMA: LMA inserted LMA Size: 4.0 Number of attempts: 1 Airway Equipment and Method: Bite block Placement Confirmation: positive ETCO2, breath sounds checked- equal and bilateral and CO2 detector Tube secured with: Tape Dental Injury: Teeth and Oropharynx as per pre-operative assessment

## 2021-11-08 NOTE — Anesthesia Postprocedure Evaluation (Addendum)
Anesthesia Post Note  Patient: Schuyler Behan Larios-Coreas  Procedure(s) Performed: DILATATION & CURETTAGE/HYSTEROSCOPY WITH NOVASURE ABLATION (Vagina )     Anesthesia Type: General Anesthetic complications: no Comments: Patient complained of dizziness after coming from bathroom. PACU RN checked BP and SBP >200. Will replace PIV and treat with IV hydralazine. Advised patient to take her po anti-hypertensives when she gets home.Norton Blizzard, MD     No notable events documented.  Last Vitals:  Vitals:   11/08/21 1330 11/08/21 1425  BP: (!) 149/86 (!) 187/89  Pulse: (!) 48 (!) 52  Resp: 18 16  Temp:  (!) 36.3 C  SpO2: 100% 98%    Last Pain:  Vitals:   11/08/21 1425  TempSrc: Oral  PainSc: 3                  Candra R Jeorgia Helming

## 2021-11-08 NOTE — Addendum Note (Signed)
Addendum  created 11/08/21 1454 by Merlinda Frederick, MD   Alternative orders not taken and original order placed, Clinical Note Signed, Order list changed, Pharmacy for encounter modified

## 2021-11-08 NOTE — Brief Op Note (Signed)
11/08/2021  12:47 PM  PATIENT:  [name]  46 y.o. female  PRE-OPERATIVE DIAGNOSIS:  menorrhagia  POST-OPERATIVE DIAGNOSIS:  menorrhagia  PROCEDURE:  Procedure(s): DILATATION & CURETTAGE/HYSTEROSCOPY WITH NOVASURE ABLATION (N/A)  SURGEON:  Surgeon(s) and Role:    * Bobbye Charleston, MD - Primary  ANESTHESIA:   general  EBL:  <20  cc  LOCAL MEDICATIONS USED:  NONE  SPECIMEN:  Source of Specimen:  uterine currettings  DISPOSITION OF SPECIMEN:  PATHOLOGY  COUNTS:  YES  TOURNIQUET:  * No tourniquets in log *  DICTATION: .Note written in EPIC  PLAN OF CARE: Discharge to home after PACU  PATIENT DISPOSITION:  PACU - hemodynamically stable.   Delay start of Pharmacological VTE agent (>24hrs) due to surgical blood loss or risk of bleeding: not applicable

## 2021-11-08 NOTE — Progress Notes (Signed)
There has been no change in the patients history, status or exam since the history and physical.  Vitals:   11/02/21 1103 11/08/21 1056  BP:  (!) 164/94  Pulse:  61  Resp:  17  Temp:  98.2 F (36.8 C)  TempSrc:  Oral  SpO2:  100%  Weight: 103.4 kg 102.2 kg  Height: 5\' 2"  (1.575 m) 5\' 2"  (1.575 m)    Results for orders placed or performed during the hospital encounter of 11/08/21 (from the past 72 hour(s))  Pregnancy, urine POC     Status: None   Collection Time: 11/08/21 10:31 AM  Result Value Ref Range   Preg Test, Ur NEGATIVE NEGATIVE    Comment:        THE SENSITIVITY OF THIS METHODOLOGY IS >24 mIU/mL   CBC     Status: Abnormal   Collection Time: 11/08/21 11:00 AM  Result Value Ref Range   WBC 5.6 4.0 - 10.5 K/uL   RBC 4.62 3.87 - 5.11 MIL/uL   Hemoglobin 13.0 12.0 - 15.0 g/dL   HCT 40.8 36.0 - 46.0 %   MCV 88.3 80.0 - 100.0 fL   MCH 28.1 26.0 - 34.0 pg   MCHC 31.9 30.0 - 36.0 g/dL   RDW 19.0 (H) 11.5 - 15.5 %   Platelets 323 150 - 400 K/uL   nRBC 0.0 0.0 - 0.2 %    Comment: Performed at [facility], [place] [address] [place], [place] [address]    Daria Pastures

## 2021-11-08 NOTE — Op Note (Signed)
11/08/2021  12:47 PM  PATIENT:  [name]  46 y.o. female  PRE-OPERATIVE DIAGNOSIS:  menorrhagia  POST-OPERATIVE DIAGNOSIS:  menorrhagia  PROCEDURE:  Procedure(s): DILATATION & CURETTAGE/HYSTEROSCOPY WITH NOVASURE ABLATION (N/A)  SURGEON:  Surgeon(s) and Role:    * Bobbye Charleston, MD - Primary  ANESTHESIA:   general  EBL:  <20  cc  LOCAL MEDICATIONS USED:  NONE  SPECIMEN:  Source of Specimen:  uterine currettings  DISPOSITION OF SPECIMEN:  PATHOLOGY  COUNTS:  YES  TOURNIQUET:  * No tourniquets in log *  DICTATION: .Note written in EPIC  PLAN OF CARE: Discharge to home after PACU  PATIENT DISPOSITION:  PACU - hemodynamically stable.   Delay start of Pharmacological VTE agent (>24hrs) due to surgical blood loss or risk of bleeding: not applicable Findings:  Shaggy endometrium.  Medications: none.  Complications: none.  After adequate anesthesia was achieved, the patient was prepped and draped in the usual sterile fashion.  The speculum was placed in the vagina and the cervix stabilized with a single-tooth tenaculum.  The cervix was dilated with pratt dilators and the hysteroscope passed inside the endometrial cavity.  The above findings were noted and sharp curettage was then performed and uterine curettings sent to path.  The cavity length was 6 cm and the Novasure instrument successfully seated.  The width was 4.5 cm and the CO2 test passed.  The ablation went for 45 sec at at power of 149 watts.  Once the ablation was completed, the Novasure instrument was removed and the hysteroscope passed into the cavity again.  Good contact was seen in all areas.   All instruments were then removed from the uterus and vagina.  The patient tolerated the procedure well.    Adaline Trejos

## 2021-11-08 NOTE — Anesthesia Postprocedure Evaluation (Signed)
Anesthesia Post Note  Patient: [name]  Procedure(s) Performed: DILATATION & CURETTAGE/HYSTEROSCOPY WITH NOVASURE ABLATION (Vagina )     Patient location during evaluation: PACU Anesthesia Type: General Level of consciousness: awake Pain management: pain level controlled Vital Signs Assessment: post-procedure vital signs reviewed and stable Respiratory status: spontaneous breathing and respiratory function stable Cardiovascular status: stable Postop Assessment: no apparent nausea or vomiting Anesthetic complications: no   No notable events documented.  Last Vitals:  Vitals:   11/08/21 1315 11/08/21 1330  BP: 123/70 (!) 149/86  Pulse: (!) 49 (!) 48  Resp: (!) 8 18  Temp:    SpO2: 100% 100%    Last Pain:  Vitals:   11/08/21 1330  TempSrc:   PainSc: 0-No pain                 Merlinda Frederick

## 2021-11-08 NOTE — Anesthesia Preprocedure Evaluation (Addendum)
Anesthesia Evaluation  Patient identified by MRN, date of birth, ID band Patient awake    Reviewed: Allergy & Precautions, NPO status , Patient's Chart, lab work & pertinent test results  Airway Mallampati: III  TM Distance: >3 FB Neck ROM: Full    Dental no notable dental hx.    Pulmonary sleep apnea ,    Pulmonary exam normal breath sounds clear to auscultation       Cardiovascular hypertension, Normal cardiovascular exam Rhythm:Regular Rate:Normal  Sinus bradycardia Moderate voltage criteria for LVH, may be normal variant ( R in aVL , Cornell product ) Borderline ECG When compared with ECG of 13-Apr-2021 14:39, PREVIOUS ECG IS PRESENT   Neuro/Psych PSYCHIATRIC DISORDERS Anxiety Depression  Neuromuscular disease (carpal tunnel, bilateral)    GI/Hepatic Neg liver ROS, GERD  ,  Endo/Other  diabetesHypothyroidism Morbid obesity  Renal/GU negative Renal ROS  negative genitourinary   Musculoskeletal negative musculoskeletal ROS (+)   Abdominal   Peds negative pediatric ROS (+)  Hematology negative hematology ROS (+)   Anesthesia Other Findings   Reproductive/Obstetrics negative OB ROS                            Anesthesia Physical Anesthesia Plan  ASA: 3  Anesthesia Plan: General   Post-op Pain Management:    Induction: Intravenous  PONV Risk Score and Plan: 3 and Midazolam, Treatment may vary due to age or medical condition, Scopolamine patch - Pre-op, Dexamethasone and Ondansetron  Airway Management Planned: LMA  Additional Equipment:   Intra-op Plan:   Post-operative Plan: Extubation in OR  Informed Consent: I have reviewed the patients History and Physical, chart, labs and discussed the procedure including the risks, benefits and alternatives for the proposed anesthesia with the patient or authorized representative who has indicated his/her understanding and acceptance.      Dental advisory given  Plan Discussed with: CRNA and Anesthesiologist  Anesthesia Plan Comments:         Anesthesia Quick Evaluation

## 2021-11-08 NOTE — Discharge Instructions (Addendum)
DISCHARGE INSTRUCTIONS: D&C / D&E The following instructions have been prepared to help you care for yourself upon your return home.   Personal hygiene:  Use sanitary pads for vaginal drainage, not tampons.  Shower the day after your procedure.  NO tub baths, pools or Jacuzzis for 2-3 weeks.  Wipe front to back after using the bathroom.  Activity and limitations:  Do NOT drive or operate any equipment for 24 hours. The effects of anesthesia are still present and drowsiness may result.  Do NOT rest in bed all day.  Walking is encouraged.  Walk up and down stairs slowly.  You may resume your normal activity in one to two days or as indicated by your physician.  Sexual activity: NO intercourse for at least 2 weeks after the procedure, or as indicated by your physician.  Diet: Eat a light meal as desired this evening. You may resume your usual diet tomorrow.  Return to work: You may resume your work activities in one to two days or as indicated by your doctor.  What to expect after your surgery: Expect to have vaginal bleeding/discharge for 2-3 days and spotting for up to 10 days. It is not unusual to have soreness for up to 1-2 weeks. You may have a slight burning sensation when you urinate for the first day. Mild cramps may continue for a couple of days. You may have a regular period in 2-6 weeks.  Call your doctor for any of the following:  Excessive vaginal bleeding, saturating and changing one pad every hour.  Inability to urinate 6 hours after discharge from hospital.  Pain not relieved by pain medication.  Fever of 100.4 F or greater.  Unusual vaginal discharge or odor.   Post Anesthesia Home Care Instructions  Activity: Get plenty of rest for the remainder of the day. A responsible individual must stay with you for 24 hours following the procedure.  For the next 24 hours, DO NOT: -Drive a car -Paediatric nurse -Drink alcoholic beverages -Take any medication unless  instructed by your physician -Make any legal decisions or sign important papers.  Meals: Start with liquid foods such as gelatin or soup. Progress to regular foods as tolerated. Avoid greasy, spicy, heavy foods. If nausea and/or vomiting occur, drink only clear liquids until the nausea and/or vomiting subsides. Call your physician if vomiting continues.  Special Instructions/Symptoms: Your throat may feel dry or sore from the anesthesia or the breathing tube placed in your throat during surgery. If this causes discomfort, gargle with warm salt water. The discomfort should disappear within 24 hours.  If you had a scopolamine patch placed behind your ear for the management of post- operative nausea and/or vomiting:  1. The medication in the patch is effective for 72 hours, after which it should be removed.  Wrap patch in a tissue and discard in the trash. Wash hands thoroughly with soap and water. 2. You may remove the patch earlier than 72 hours if you experience unpleasant side effects which may include dry mouth, dizziness or visual disturbances. 3. Avoid touching the patch. Wash your hands with soap and water after contact with the patch.  No acetaminophen/Tylenol until after 5:00 pm today if needed. No ibuprofen, Advil, Aleve, Motrin, ketorolac, meloxicam, naproxen, or other NSAIDS until after 6:45 pm today if needed.

## 2021-11-08 NOTE — Transfer of Care (Signed)
Immediate Anesthesia Transfer of Care Note  Patient: [name]  Procedure(s) Performed: Procedure(s) (LRB): DILATATION & CURETTAGE/HYSTEROSCOPY WITH NOVASURE ABLATION (N/A)  Patient Location: PACU  Anesthesia Type: General  Level of Consciousness: awake, sedated, patient cooperative and responds to stimulation  Airway & Oxygen Therapy: Patient Spontanous Breathing and Patient connected to [place] 02 and soft FM   Post-op Assessment: Report given to PACU RN, Post -op Vital signs reviewed and stable and Patient moving all extremities  Post vital signs: Reviewed and stable  Complications: No apparent anesthesia complications

## 2021-11-09 ENCOUNTER — Encounter (HOSPITAL_BASED_OUTPATIENT_CLINIC_OR_DEPARTMENT_OTHER): Payer: Self-pay | Admitting: Obstetrics and Gynecology

## 2021-11-09 LAB — SURGICAL PATHOLOGY

## 2021-11-16 ENCOUNTER — Other Ambulatory Visit: Payer: Medicaid Other

## 2021-11-16 ENCOUNTER — Ambulatory Visit: Payer: Medicaid Other | Admitting: Hematology and Oncology

## 2021-11-16 ENCOUNTER — Other Ambulatory Visit: Payer: Self-pay | Admitting: Hematology and Oncology

## 2021-11-16 DIAGNOSIS — D500 Iron deficiency anemia secondary to blood loss (chronic): Secondary | ICD-10-CM

## 2021-11-28 ENCOUNTER — Other Ambulatory Visit: Payer: Self-pay

## 2021-11-28 ENCOUNTER — Encounter (HOSPITAL_BASED_OUTPATIENT_CLINIC_OR_DEPARTMENT_OTHER): Payer: Self-pay | Admitting: Plastic Surgery

## 2021-11-30 NOTE — H&P (Signed)
Subjective:     Patient ID: [name] is a 46 y.o. female.     Returns for follow up discussion prior to planned panniculectomy. 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 Highest weight 390 lb. Lowest weight post bypass 190 lb. Had pregnancy following bypass, delivered 02/2019.   Wt 17 lb overlast year. Goal 30 lb more.   Reports daily rash and itching beneath panniculus. This has not been controlled with topical antifungal and hygeine measures. Reports oral antibiotics prescribed for this post partum/C section. Has not required oral antibiotics for over year.   Has had intermittent abdominal pain and CT obtained as below, no evidence hernia.   OSA and DM resolved with weight loss. HbA1c 10/2021 5.4. HTN improved post weight loss- reports fewer medications.    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 Last Hb 11/08/2021 13.   Lives with infant, who has [place]. Not working currently. Has adult daughter and son in area to assist with post op care.       CLINICAL DATA:  Paraumbilical abdominal pain and abdominal distension for 3 months. Weight gain over past 3 months. Previous gastric bypass surgery.   EXAM: CT ABDOMEN AND PELVIS WITHOUT CONTRAST   TECHNIQUE: Multidetector CT imaging of the abdomen and pelvis was performed following the standard protocol without IV contrast.   COMPARISON:  03/14/2017 from [facility]   FINDINGS: Lower chest: No acute findings.   Hepatobiliary: No mass visualized on this unenhanced exam. Gallbladder is unremarkable. No evidence of biliary ductal dilatation.   Pancreas: No mass or inflammatory process visualized on this unenhanced exam.   Spleen:  Within normal limits in size.   Adrenals/Urinary tract: No evidence of urolithiasis or hydronephrosis. Unremarkable unopacified urinary bladder.   Stomach/Bowel: Postop changes from previous gastric bypass surgery noted. No evidence of obstruction,  inflammatory process, or abnormal fluid collections. Normal appendix visualized.   Vascular/Lymphatic: No pathologically enlarged lymph nodes identified. No evidence of abdominal aortic aneurysm.   Reproductive: No mass or other significant abnormality. Clips are seen from previous bilateral tubal ligation.   Other: A new small low-attenuation fluid collection is seen in the umbilicus, but no hernia defect is identified. This measures 3.0 x 2.6 cm, and likely represents a small postop fluid collection. No evidence of associated inflammatory changes. No evidence of ascites.   Musculoskeletal:  No suspicious bone lesions identified.   IMPRESSION: 1. 3 cm low-attenuation fluid collection in umbilicus, likely representing a small postop fluid collection. No hernia defect identified. 2. No other significant abnormality identified.     Electronically Signed   By: Marlaine Hind M.D.   On: 04/20/2020 08:17   Review of Systems   Review of Systems  Skin: Positive for rash.       +itching    Remainder 12 point review negative    Objective:   Physical Exam Cardiovascular:     Rate and Rhythm: Normal rate and regular rhythm.     Heart sounds: Normal heart sounds.  Pulmonary:     Effort: Pulmonary effort is normal.     Breath sounds: Normal breath sounds.  Abdominal:     Comments: No gross hernia, obese, panniculus present that extends below pubic symphysis, significant mons ptosis and fullness, additional soft tissue rolls upper medial thighs  Skin:    Comments: [place] 3  Neurological:     Mental Status: She is oriented to person, place, and time.  Assessment:     S/p Roux en Y bypass Panniculitis Morbid obesity    Plan:     Significant panniculus present with constant intertrigo. Reviewed panniculectomy incisions which would include resection umbilicus. Reviewed in mirror amount of tissue that can be expected to be excised including cephalic portion mons.  Reviewed no effect on soft tissue medial thighs. Reviewed changes with aging weight changes and not a permanent operation. Quoted 109% chance complication such as wound healing problems and may take several months for all wounds to heal. Reviewed drains overnight stay. Reviewed post op limitations including weight restrictions-will need assistance with her household for several weeks. Plan overnight stay.   Additional risks including but not limited to bleeding hematoma seroma need for additional procedures damage to adjacent structures blood clots in legs or lungs infection reviewed.   Drain teaching completed. Rx for oxycodone given.

## 2021-12-05 ENCOUNTER — Encounter (HOSPITAL_BASED_OUTPATIENT_CLINIC_OR_DEPARTMENT_OTHER)
Admission: RE | Admit: 2021-12-05 | Discharge: 2021-12-05 | Disposition: A | Discharge status: Home / Self Care | Payer: Medicaid Other | Source: Ambulatory Visit | Attending: Plastic Surgery | Admitting: Plastic Surgery

## 2021-12-05 DIAGNOSIS — Z980 Intestinal bypass and anastomosis status: Secondary | ICD-10-CM | POA: Diagnosis not present

## 2021-12-05 DIAGNOSIS — I9589 Other hypotension: Secondary | ICD-10-CM | POA: Diagnosis not present

## 2021-12-05 DIAGNOSIS — R58 Hemorrhage, not elsewhere classified: Secondary | ICD-10-CM | POA: Diagnosis not present

## 2021-12-05 DIAGNOSIS — Z20822 Contact with and (suspected) exposure to covid-19: Secondary | ICD-10-CM | POA: Diagnosis not present

## 2021-12-05 DIAGNOSIS — L7631 Postprocedural hematoma of skin and subcutaneous tissue following a dermatologic procedure: Secondary | ICD-10-CM | POA: Diagnosis present

## 2021-12-05 LAB — CBC WITH DIFFERENTIAL/PLATELET
Abs Immature Granulocytes: 0.02 10*3/uL (ref 0.00–0.07)
Basophils Absolute: 0.1 10*3/uL (ref 0.0–0.1)
Basophils Relative: 1 %
Eosinophils Absolute: 0.3 10*3/uL (ref 0.0–0.5)
Eosinophils Relative: 4 %
HCT: 38.6 % (ref 36.0–46.0)
Hemoglobin: 12.3 g/dL (ref 12.0–15.0)
Immature Granulocytes: 0 %
Lymphocytes Relative: 34 %
Lymphs Abs: 2.4 10*3/uL (ref 0.7–4.0)
MCH: 28.1 pg (ref 26.0–34.0)
MCHC: 31.9 g/dL (ref 30.0–36.0)
MCV: 88.3 fL (ref 80.0–100.0)
Monocytes Absolute: 0.6 10*3/uL (ref 0.1–1.0)
Monocytes Relative: 8 %
Neutro Abs: 3.6 10*3/uL (ref 1.7–7.7)
Neutrophils Relative %: 53 %
Platelets: 331 10*3/uL (ref 150–400)
RBC: 4.37 MIL/uL (ref 3.87–5.11)
RDW: 14.7 % (ref 11.5–15.5)
WBC: 6.9 10*3/uL (ref 4.0–10.5)
nRBC: 0 % (ref 0.0–0.2)

## 2021-12-05 LAB — BASIC METABOLIC PANEL
Anion gap: 8 (ref 5–15)
BUN: 13 mg/dL (ref 6–20)
CO2: 25 mmol/L (ref 22–32)
Calcium: 8.5 mg/dL — ABNORMAL LOW (ref 8.9–10.3)
Chloride: 104 mmol/L (ref 98–111)
Creatinine, Ser: 0.8 mg/dL (ref 0.44–1.00)
GFR, Estimated: >60 mL/min (ref >60)
Glucose, Bld: 110 mg/dL — ABNORMAL HIGH (ref 70–99)
Potassium: 3.5 mmol/L (ref 3.5–5.1)
Sodium: 137 mmol/L (ref 135–145)

## 2021-12-05 MED ORDER — CHLORHEXIDINE GLUCONATE CLOTH 2 % EX PADS: 6.0000 | MEDICATED_PAD | Freq: Once | CUTANEOUS | Status: DC

## 2021-12-05 NOTE — Progress Notes (Signed)
° ° ° ° °  Enhanced Recovery after Surgery  Enhanced Recovery after Surgery is a protocol used to improve the stress on your body and your recovery after surgery.  Patient Instructions  The night before surgery:  No food after midnight. ONLY clear liquids after midnight  The day of surgery (if you do NOT have diabetes):  Drink ONE (1) Pre-Surgery Clear Ensure as directed.   This drink was given to you during your hospital  pre-op appointment visit. The pre-op nurse will instruct you on the time to drink the  Pre-Surgery Ensure depending on your surgery time. Finish the drink at the designated time by the pre-op nurse.  Nothing else to drink after completing the  Pre-Surgery Clear Ensure.  The day of surgery (if you have diabetes): Drink ONE (1) Gatorade 2 (G2) as directed. This drink was given to you during your hospital  pre-op appointment visit.  The pre-op nurse will instruct you on the time to drink the   Gatorade 2 (G2) depending on your surgery time. Color of the Gatorade may vary. Red is not allowed. Nothing else to drink after completing the  Gatorade 2 (G2).         If you have questions, please contact your surgeon's office.  Surgical soap given with written instructions 

## 2021-12-08 ENCOUNTER — Other Ambulatory Visit: Payer: Self-pay

## 2021-12-08 ENCOUNTER — Ambulatory Visit (HOSPITAL_BASED_OUTPATIENT_CLINIC_OR_DEPARTMENT_OTHER): Payer: Medicaid Other | Admitting: Anesthesiology

## 2021-12-08 ENCOUNTER — Encounter (HOSPITAL_BASED_OUTPATIENT_CLINIC_OR_DEPARTMENT_OTHER): Payer: Self-pay | Admitting: Plastic Surgery

## 2021-12-08 ENCOUNTER — Ambulatory Visit (HOSPITAL_BASED_OUTPATIENT_CLINIC_OR_DEPARTMENT_OTHER)
Admission: RE | Admit: 2021-12-08 | Discharge: 2021-12-09 | Disposition: A | Discharge status: Home / Self Care | Payer: Medicaid Other | Source: Home / Self Care | Attending: Plastic Surgery | Admitting: Plastic Surgery

## 2021-12-08 ENCOUNTER — Encounter (HOSPITAL_BASED_OUTPATIENT_CLINIC_OR_DEPARTMENT_OTHER)
Admission: RE | Disposition: A | Discharge status: Home / Self Care | Payer: Self-pay | Source: Home / Self Care | Attending: Plastic Surgery

## 2021-12-08 DIAGNOSIS — Z20822 Contact with and (suspected) exposure to covid-19: Secondary | ICD-10-CM | POA: Diagnosis not present

## 2021-12-08 DIAGNOSIS — I9581 Postprocedural hypotension: Secondary | ICD-10-CM | POA: Insufficient documentation

## 2021-12-08 DIAGNOSIS — E119 Type 2 diabetes mellitus without complications: Secondary | ICD-10-CM | POA: Diagnosis not present

## 2021-12-08 DIAGNOSIS — L7631 Postprocedural hematoma of skin and subcutaneous tissue following a dermatologic procedure: Secondary | ICD-10-CM | POA: Diagnosis not present

## 2021-12-08 DIAGNOSIS — F418 Other specified anxiety disorders: Secondary | ICD-10-CM | POA: Diagnosis not present

## 2021-12-08 DIAGNOSIS — I10 Essential (primary) hypertension: Secondary | ICD-10-CM

## 2021-12-08 DIAGNOSIS — Z6841 Body Mass Index (BMI) 40.0 and over, adult: Secondary | ICD-10-CM | POA: Insufficient documentation

## 2021-12-08 DIAGNOSIS — M793 Panniculitis, unspecified: Secondary | ICD-10-CM | POA: Diagnosis present

## 2021-12-08 DIAGNOSIS — L304 Erythema intertrigo: Secondary | ICD-10-CM | POA: Insufficient documentation

## 2021-12-08 DIAGNOSIS — R58 Hemorrhage, not elsewhere classified: Secondary | ICD-10-CM | POA: Diagnosis not present

## 2021-12-08 DIAGNOSIS — Z9884 Bariatric surgery status: Secondary | ICD-10-CM | POA: Insufficient documentation

## 2021-12-08 DIAGNOSIS — D500 Iron deficiency anemia secondary to blood loss (chronic): Secondary | ICD-10-CM

## 2021-12-08 DIAGNOSIS — Z79899 Other long term (current) drug therapy: Secondary | ICD-10-CM

## 2021-12-08 DIAGNOSIS — Z980 Intestinal bypass and anastomosis status: Secondary | ICD-10-CM | POA: Diagnosis not present

## 2021-12-08 HISTORY — PX: PANNICULECTOMY: SHX5360

## 2021-12-08 LAB — GLUCOSE, CAPILLARY
Glucose-Capillary: 110 mg/dL — ABNORMAL HIGH (ref 70–99)
Glucose-Capillary: 165 mg/dL — ABNORMAL HIGH (ref 70–99)
Glucose-Capillary: 222 mg/dL — ABNORMAL HIGH (ref 70–99)

## 2021-12-08 LAB — POCT PREGNANCY, URINE: Preg Test, Ur: NEGATIVE

## 2021-12-08 SURGERY — PANNICULECTOMY
Anesthesia: General | Site: Abdomen

## 2021-12-08 MED ORDER — ROCURONIUM BROMIDE 10 MG/ML (PF) SYRINGE
PREFILLED_SYRINGE | INTRAVENOUS | Status: AC
  Filled 2021-12-08: qty 10

## 2021-12-08 MED ORDER — SCOPOLAMINE 1 MG/3DAYS TD PT72
MEDICATED_PATCH | TRANSDERMAL | Status: AC
  Filled 2021-12-08: qty 1

## 2021-12-08 MED ORDER — ONDANSETRON 4 MG PO TBDP: 4.0000 mg | ORAL_TABLET | Freq: Four times a day (QID) | ORAL | Status: DC | PRN

## 2021-12-08 MED ORDER — EPHEDRINE 5 MG/ML INJ
INTRAVENOUS | Status: AC
  Filled 2021-12-08: qty 5

## 2021-12-08 MED ORDER — LEVOTHYROXINE SODIUM 100 MCG PO TABS
200.0000 ug | ORAL_TABLET | Freq: Every day | ORAL | Status: DC
  Administered 2021-12-09: 200 ug via ORAL
  Filled 2021-12-08: qty 2
  Filled 2021-12-08: qty 1

## 2021-12-08 MED ORDER — MIDAZOLAM HCL 2 MG/2ML IJ SOLN
INTRAMUSCULAR | Status: AC
  Filled 2021-12-08: qty 2

## 2021-12-08 MED ORDER — CEFAZOLIN SODIUM-DEXTROSE 2-4 GM/100ML-% IV SOLN
INTRAVENOUS | Status: AC
  Filled 2021-12-08: qty 100

## 2021-12-08 MED ORDER — ONDANSETRON HCL 4 MG/2ML IJ SOLN
INTRAMUSCULAR | Status: AC
  Filled 2021-12-08: qty 2

## 2021-12-08 MED ORDER — 0.9 % SODIUM CHLORIDE (POUR BTL) OPTIME
TOPICAL | Status: DC | PRN
Start: 2021-12-08 — End: 2021-12-08
  Administered 2021-12-08: 400 mL

## 2021-12-08 MED ORDER — SUGAMMADEX SODIUM 500 MG/5ML IV SOLN
INTRAVENOUS | Status: DC | PRN
  Administered 2021-12-08: 200 mg via INTRAVENOUS

## 2021-12-08 MED ORDER — LIDOCAINE HCL (CARDIAC) PF 100 MG/5ML IV SOSY
PREFILLED_SYRINGE | INTRAVENOUS | Status: DC | PRN
  Administered 2021-12-08: 80 mg via INTRAVENOUS

## 2021-12-08 MED ORDER — FENTANYL CITRATE (PF) 100 MCG/2ML IJ SOLN
INTRAMUSCULAR | Status: AC
  Filled 2021-12-08: qty 2

## 2021-12-08 MED ORDER — HEPARIN SODIUM (PORCINE) 5000 UNIT/ML IJ SOLN
INTRAMUSCULAR | Status: AC
  Filled 2021-12-08: qty 1

## 2021-12-08 MED ORDER — ONDANSETRON HCL 4 MG/2ML IJ SOLN: 4.0000 mg | Freq: Four times a day (QID) | INTRAMUSCULAR | Status: DC | PRN

## 2021-12-08 MED ORDER — SUGAMMADEX SODIUM 500 MG/5ML IV SOLN
INTRAVENOUS | Status: AC
  Filled 2021-12-08: qty 5

## 2021-12-08 MED ORDER — IRBESARTAN 75 MG PO TABS
37.5000 mg | ORAL_TABLET | Freq: Every day | ORAL | Status: DC
  Filled 2021-12-08: qty 0.5

## 2021-12-08 MED ORDER — EPHEDRINE SULFATE (PRESSORS) 50 MG/ML IJ SOLN
INTRAMUSCULAR | Status: DC | PRN
  Administered 2021-12-08 (×2): 10 mg via INTRAVENOUS

## 2021-12-08 MED ORDER — LIDOCAINE 2% (20 MG/ML) 5 ML SYRINGE
INTRAMUSCULAR | Status: AC
  Filled 2021-12-08: qty 5

## 2021-12-08 MED ORDER — ALBUMIN HUMAN 5 % IV SOLN
INTRAVENOUS | Status: DC | PRN
Start: 2021-12-08 — End: 2021-12-08

## 2021-12-08 MED ORDER — DEXAMETHASONE SODIUM PHOSPHATE 10 MG/ML IJ SOLN
INTRAMUSCULAR | Status: AC
  Filled 2021-12-08: qty 1

## 2021-12-08 MED ORDER — ACETAMINOPHEN 500 MG PO TABS
1000.0000 mg | ORAL_TABLET | ORAL | Status: AC
  Administered 2021-12-08: 1000 mg via ORAL

## 2021-12-08 MED ORDER — SUCCINYLCHOLINE CHLORIDE 200 MG/10ML IV SOSY
PREFILLED_SYRINGE | INTRAVENOUS | Status: AC
  Filled 2021-12-08: qty 10

## 2021-12-08 MED ORDER — ENOXAPARIN SODIUM 40 MG/0.4ML IJ SOSY: 40.0000 mg | PREFILLED_SYRINGE | INTRAMUSCULAR | Status: DC

## 2021-12-08 MED ORDER — SCOPOLAMINE 1 MG/3DAYS TD PT72
1.0000 | MEDICATED_PATCH | TRANSDERMAL | Status: DC
  Administered 2021-12-08: 1.5 mg via TRANSDERMAL

## 2021-12-08 MED ORDER — SODIUM CHLORIDE 0.9 % IV SOLN: INTRAVENOUS | Status: DC

## 2021-12-08 MED ORDER — ACETAMINOPHEN 500 MG PO TABS
ORAL_TABLET | ORAL | Status: AC
  Filled 2021-12-08: qty 2

## 2021-12-08 MED ORDER — ROCURONIUM BROMIDE 100 MG/10ML IV SOLN
INTRAVENOUS | Status: DC | PRN
  Administered 2021-12-08: 10 mg via INTRAVENOUS
  Administered 2021-12-08: 80 mg via INTRAVENOUS

## 2021-12-08 MED ORDER — CEFAZOLIN SODIUM-DEXTROSE 2-4 GM/100ML-% IV SOLN
2.0000 g | INTRAVENOUS | Status: AC
  Administered 2021-12-08: 2 g via INTRAVENOUS

## 2021-12-08 MED ORDER — METHOCARBAMOL 500 MG PO TABS: 500.0000 mg | ORAL_TABLET | Freq: Four times a day (QID) | ORAL | Status: DC | PRN

## 2021-12-08 MED ORDER — OXYCODONE HCL 5 MG PO TABS: 5.0000 mg | ORAL_TABLET | Freq: Once | ORAL | Status: DC | PRN

## 2021-12-08 MED ORDER — GABAPENTIN 300 MG PO CAPS
ORAL_CAPSULE | ORAL | Status: AC
  Filled 2021-12-08: qty 1

## 2021-12-08 MED ORDER — GABAPENTIN 300 MG PO CAPS
300.0000 mg | ORAL_CAPSULE | ORAL | Status: AC
  Administered 2021-12-08: 300 mg via ORAL

## 2021-12-08 MED ORDER — LACTATED RINGERS IV SOLN
INTRAVENOUS | Status: DC
  Administered 2021-12-08: 10 mL/h via INTRAVENOUS

## 2021-12-08 MED ORDER — BUPIVACAINE HCL (PF) 0.5 % IJ SOLN
INTRAMUSCULAR | Status: DC | PRN
  Administered 2021-12-08: 30 mL

## 2021-12-08 MED ORDER — OXYCODONE HCL 5 MG/5ML PO SOLN: 5.0000 mg | Freq: Once | ORAL | Status: DC | PRN

## 2021-12-08 MED ORDER — HEPARIN SODIUM (PORCINE) 5000 UNIT/ML IJ SOLN
5000.0000 [IU] | Freq: Once | INTRAMUSCULAR | Status: AC
  Administered 2021-12-08: 5000 [IU] via SUBCUTANEOUS

## 2021-12-08 MED ORDER — DEXAMETHASONE SODIUM PHOSPHATE 4 MG/ML IJ SOLN
INTRAMUSCULAR | Status: DC | PRN
  Administered 2021-12-08: 5 mg via INTRAVENOUS

## 2021-12-08 MED ORDER — PHENYLEPHRINE 40 MCG/ML (10ML) SYRINGE FOR IV PUSH (FOR BLOOD PRESSURE SUPPORT)
PREFILLED_SYRINGE | INTRAVENOUS | Status: AC
  Filled 2021-12-08: qty 10

## 2021-12-08 MED ORDER — OXYCODONE HCL 5 MG PO TABS: 5.0000 mg | ORAL_TABLET | ORAL | Status: DC | PRN

## 2021-12-08 MED ORDER — HYDROMORPHONE HCL 1 MG/ML IJ SOLN: 0.5000 mg | INTRAMUSCULAR | Status: DC | PRN

## 2021-12-08 MED ORDER — AMISULPRIDE (ANTIEMETIC) 5 MG/2ML IV SOLN: 10.0000 mg | Freq: Once | INTRAVENOUS | Status: DC | PRN

## 2021-12-08 MED ORDER — ATROPINE SULFATE 0.4 MG/ML IV SOLN
INTRAVENOUS | Status: AC
  Filled 2021-12-08: qty 1

## 2021-12-08 MED ORDER — ACETAMINOPHEN 500 MG PO TABS
1000.0000 mg | ORAL_TABLET | Freq: Four times a day (QID) | ORAL | Status: DC | PRN
  Administered 2021-12-08: 1000 mg via ORAL
  Filled 2021-12-08: qty 2

## 2021-12-08 MED ORDER — MIDAZOLAM HCL 5 MG/5ML IJ SOLN
INTRAMUSCULAR | Status: DC | PRN
Start: 2021-12-08 — End: 2021-12-08
  Administered 2021-12-08: 2 mg via INTRAVENOUS

## 2021-12-08 MED ORDER — FENTANYL CITRATE (PF) 100 MCG/2ML IJ SOLN
INTRAMUSCULAR | Status: DC | PRN
  Administered 2021-12-08 (×2): 50 ug via INTRAVENOUS
  Administered 2021-12-08: 100 ug via INTRAVENOUS

## 2021-12-08 MED ORDER — FENTANYL CITRATE (PF) 100 MCG/2ML IJ SOLN
25.0000 ug | INTRAMUSCULAR | Status: DC | PRN
  Administered 2021-12-08: 50 ug via INTRAVENOUS
  Administered 2021-12-08: 25 ug via INTRAVENOUS

## 2021-12-08 MED ORDER — PROPOFOL 10 MG/ML IV BOLUS
INTRAVENOUS | Status: DC | PRN
  Administered 2021-12-08: 200 mg via INTRAVENOUS

## 2021-12-08 MED ORDER — PROMETHAZINE HCL 25 MG/ML IJ SOLN: 6.2500 mg | INTRAMUSCULAR | Status: DC | PRN

## 2021-12-08 MED ORDER — FLUOXETINE HCL 20 MG PO CAPS
40.0000 mg | ORAL_CAPSULE | Freq: Every day | ORAL | Status: DC
  Administered 2021-12-08: 40 mg via ORAL
  Filled 2021-12-08: qty 2

## 2021-12-08 SURGICAL SUPPLY — 50 items
ADH SKN CLS APL DERMABOND .7 (GAUZE/BANDAGES/DRESSINGS) ×4
APL PRP STRL LF DISP 70% ISPRP (MISCELLANEOUS) ×3
APPLIER CLIP 9.375 MED OPEN (MISCELLANEOUS) ×4
APR CLP MED 9.3 20 MLT OPN (MISCELLANEOUS) ×2
BINDER ABDOMINAL 10 UNV 27-48 (MISCELLANEOUS) IMPLANT
BINDER ABDOMINAL 12 ML 46-62 (SOFTGOODS) ×1 IMPLANT
BINDER ABDOMINAL 12 SM 30-45 (SOFTGOODS) IMPLANT
BLADE CLIPPER SURG (BLADE) ×1 IMPLANT
BLADE SURG 10 STRL SS (BLADE) ×4 IMPLANT
BLADE SURG 15 STRL LF DISP TIS (BLADE) IMPLANT
BLADE SURG 15 STRL SS (BLADE) ×2
CANISTER SUCT 1200ML W/VALVE (MISCELLANEOUS) ×2 IMPLANT
CHLORAPREP W/TINT 26 (MISCELLANEOUS) ×4 IMPLANT
CLIP APPLIE 9.375 MED OPEN (MISCELLANEOUS) ×1 IMPLANT
COVER BACK TABLE 60X90IN (DRAPES) ×2 IMPLANT
COVER MAYO STAND STRL (DRAPES) ×2 IMPLANT
DERMABOND ADVANCED (GAUZE/BANDAGES/DRESSINGS) ×4
DERMABOND ADVANCED .7 DNX12 (GAUZE/BANDAGES/DRESSINGS) ×2 IMPLANT
DRAIN CHANNEL 19F RND (DRAIN) ×2 IMPLANT
DRAPE TOP ARMCOVERS (MISCELLANEOUS) ×2 IMPLANT
DRAPE U-SHAPE 76X120 STRL (DRAPES) ×2 IMPLANT
DRAPE UTILITY XL STRL (DRAPES) ×3 IMPLANT
DRSG PAD ABDOMINAL 8X10 ST (GAUZE/BANDAGES/DRESSINGS) ×7 IMPLANT
ELECT COATED BLADE 2.86 ST (ELECTRODE) ×1 IMPLANT
ELECT REM PT RETURN 9FT ADLT (ELECTROSURGICAL) ×2
ELECTRODE REM PT RTRN 9FT ADLT (ELECTROSURGICAL) ×1 IMPLANT
EVACUATOR SILICONE 100CC (DRAIN) ×4 IMPLANT
GLOVE SURG HYDRASOFT LTX SZ5.5 (GLOVE) ×5 IMPLANT
GOWN STRL REUS W/ TWL LRG LVL3 (GOWN DISPOSABLE) ×2 IMPLANT
GOWN STRL REUS W/TWL LRG LVL3 (GOWN DISPOSABLE) ×4
NDL HYPO 25X1 1.5 SAFETY (NEEDLE) IMPLANT
NEEDLE HYPO 25X1 1.5 SAFETY (NEEDLE) ×2 IMPLANT
NS IRRIG 1000ML POUR BTL (IV SOLUTION) ×2 IMPLANT
PACK BASIN DAY SURGERY FS (CUSTOM PROCEDURE TRAY) ×2 IMPLANT
PENCIL SMOKE EVACUATOR (MISCELLANEOUS) ×2 IMPLANT
PIN SAFETY STERILE (MISCELLANEOUS) ×2 IMPLANT
SHEET MEDIUM DRAPE 40X70 STRL (DRAPES) ×4 IMPLANT
SLEEVE SCD COMPRESS KNEE MED (STOCKING) ×2 IMPLANT
SPONGE T-LAP 18X18 ~~LOC~~+RFID (SPONGE) ×4 IMPLANT
STAPLER VISISTAT 35W (STAPLE) ×2 IMPLANT
SUT ETHILON 2 0 FS 18 (SUTURE) ×3 IMPLANT
SUT MNCRL AB 4-0 PS2 18 (SUTURE) ×5 IMPLANT
SUT PDS AB 0 CT 36 (SUTURE) ×4 IMPLANT
SUT VLOC 180 0 24IN GS25 (SUTURE) ×3 IMPLANT
SYR BULB IRRIG 60ML STRL (SYRINGE) ×2 IMPLANT
SYR CONTROL 10ML LL (SYRINGE) ×1 IMPLANT
TOWEL GREEN STERILE FF (TOWEL DISPOSABLE) ×2 IMPLANT
TUBE CONNECTING 20X1/4 (TUBING) ×2 IMPLANT
UNDERPAD 30X36 HEAVY ABSORB (UNDERPADS AND DIAPERS) ×4 IMPLANT
YANKAUER SUCT BULB TIP NO VENT (SUCTIONS) ×2 IMPLANT

## 2021-12-08 NOTE — Interval H&P Note (Signed)
History and Physical Interval Note:  12/08/2021 8:20 AM  [name]  has presented today for surgery, with the diagnosis of panniculitis, morbid obesity.  The various methods of treatment have been discussed with the patient and family. After consideration of risks, benefits and other options for treatment, the patient has consented to  Procedure(s): PANNICULECTOMY (N/A) as a surgical intervention.  The patient's history has been reviewed, patient examined, no change in status, stable for surgery.  I have reviewed the patient's chart and labs.  Questions were answered to the patient's satisfaction.     Arnoldo Hooker Donice Alperin

## 2021-12-08 NOTE — Anesthesia Procedure Notes (Signed)

## 2021-12-08 NOTE — Op Note (Signed)
Operative Note   DATE OF OPERATION: 2.10.23  LOCATION: [facility]-observation  SURGICAL DIVISION: Plastic Surgery  PREOPERATIVE DIAGNOSES:  Panniculitis  POSTOPERATIVE DIAGNOSES:  same  PROCEDURE:  Panniculectomy  SURGEON: Irene Limbo MD MBA  ASSISTANT: none  ANESTHESIA:  General.   EBL: 35 ml  COMPLICATIONS: None immediate.   INDICATIONS FOR PROCEDURE:  The patient, [name], is a 46 y.o. female born on 07/25/1976, is here for treatment recurrent panniculitis following bariatric surgery that has failed conservative measures.   FINDINGS: Total 5105 g soft tissue resection  DESCRIPTION OF PROCEDURE:  The patient's operative site was marked with the patient in the preoperative area. SQ heparin administered.The patient was taken to the operating room. SCDs were placed and IV antibiotics were given. The patient's operative site was prepped and draped in a sterile fashion. A time out was performed and all information was confirmed to be correct.    Low transverse abdominal incision made approximately 7 cm superior to anterior fourchette. Skin flap elevated in sub Scarpa's layer, taking care to leave layer of subfascial fat over abdominal wall fascia. Dissection completed toward umbilicus. Umbilicus transected at abdominal wall and included in soft tissue resection. Wound irrigated and hemostasis obtained. Local anesthetic infiltrated. 28 Fr JP placed in right and left subcutaneous abdomen and secured with 2-0 nylon. Caudal extent skin excision marked by palpation. Area marked excised. Low transverse abdominal skin incision closed with 0 PDS in superficial fascia. 0 V lock used to close dermis and 4-0 monocryl for subcuticular skin closure. Dermabond applied to incision. Dry dressing and abdominal binder placed.  The patient was allowed to wake from anesthesia, extubated and taken to the recovery room in satisfactory condition.   SPECIMENS: none  DRAINS: 19 Fr JP in  right and left subcutaneous abdomen.

## 2021-12-08 NOTE — Progress Notes (Signed)
MD called to report that patient has had increased drainage from JP's. Patient was ambulated to the [facility] and felt dizzy. Right side was 200 cc and left side was 130 cc. No clots noted on the right side but left side had one small clot. Lower abdominal incision looked the same as on arrival to the PACU. Blood pressure 123/76, HR 67, and oxygen saturation 97% on room air. Patient is alert and oriented with a pain score of 5. No new orders received at this time. Will continue to closely monitor patient. Setzer, Marchelle Folks

## 2021-12-08 NOTE — Anesthesia Preprocedure Evaluation (Addendum)
Anesthesia Evaluation  Patient identified by MRN, date of birth, ID band Patient awake    Reviewed: Allergy & Precautions, NPO status , Patient's Chart, lab work & pertinent test results  Airway Mallampati: II  TM Distance: >3 FB Neck ROM: Full    Dental no notable dental hx.    Pulmonary sleep apnea ,    Pulmonary exam normal breath sounds clear to auscultation       Cardiovascular Exercise Tolerance: Good hypertension, Pt. on medications Normal cardiovascular exam Rhythm:Regular Rate:Normal  Sinus bradycardia Moderate voltage criteria for LVH, may be normal variant ( R in aVL , Cornell product ) Borderline ECG When compared with ECG of 13-Apr-2021 14:39, PREVIOUS ECG IS PRESENT No significant change since last tracing Confirmed by Kirk Ruths [phone]) on 11/08/2021 1:25:52 PM   Neuro/Psych PSYCHIATRIC DISORDERS Anxiety Depression    GI/Hepatic GERD  ,S/p gastric bypass   Endo/Other  diabetesHypothyroidism Morbid obesity  Renal/GU      Musculoskeletal   Abdominal   Peds  Hematology   Anesthesia Other Findings   Reproductive/Obstetrics                            Anesthesia Physical Anesthesia Plan  ASA: 3  Anesthesia Plan: General   Post-op Pain Management:    Induction: Intravenous  PONV Risk Score and Plan: 3 and Scopolamine patch - Pre-op, Treatment may vary due to age or medical condition, Midazolam, Dexamethasone and Ondansetron  Airway Management Planned: Oral ETT  Additional Equipment: None  Intra-op Plan:   Post-operative Plan: Extubation in OR  Informed Consent: I have reviewed the patients History and Physical, chart, labs and discussed the procedure including the risks, benefits and alternatives for the proposed anesthesia with the patient or authorized representative who has indicated his/her understanding and acceptance.     Dental advisory given  Plan  Discussed with: CRNA, Anesthesiologist and Surgeon  Anesthesia Plan Comments:         Anesthesia Quick Evaluation

## 2021-12-08 NOTE — Progress Notes (Signed)
Plastic Surgery  POD#0 panniculectomy  Patient alert states pain not at incision but mid abdomen burning type pain.  States no nausea, asking for son to bring her food, states does not eat pasta or juice or sodas. Able to void 450 ml on bedside commode Drains with increased drainage after out of bed  PE:  Drains watery serosanguinous dark Incision dry intact Abd soft  A/P Some hypotension but patient remains AO.  Drain output not unexpected for amount soft tissue resected and anticipate this to remain high for few days, abdominal exam bening Hold HTN medication and will hold am Lovenox. IVF increased  Irene Limbo, MD [facility]

## 2021-12-08 NOTE — Transfer of Care (Signed)
Immediate Anesthesia Transfer of Care Note  Patient: [name]  Procedure(s) Performed: PANNICULECTOMY (Abdomen)  Patient Location: PACU  Anesthesia Type:General  Level of Consciousness: awake, alert , drowsy and patient cooperative  Airway & Oxygen Therapy: Patient Spontanous Breathing and Patient connected to face mask oxygen  Post-op Assessment: Report given to RN and Post -op Vital signs reviewed and stable  Post vital signs: Reviewed and stable  Last Vitals:  Vitals Value Taken Time  BP    Temp    Pulse 85 12/08/21 1235  Resp    SpO2 100 % 12/08/21 1235  Vitals shown include unvalidated device data.  Last Pain:  Vitals:   12/08/21 0754  TempSrc: Oral  PainSc: 0-No pain         Complications: No notable events documented.

## 2021-12-08 NOTE — Anesthesia Postprocedure Evaluation (Signed)
Anesthesia Post Note  Patient: [name]  Procedure(s) Performed: PANNICULECTOMY (Abdomen)     Patient location during evaluation: PACU Anesthesia Type: General Level of consciousness: awake and alert Pain management: pain level controlled Vital Signs Assessment: post-procedure vital signs reviewed and stable Respiratory status: spontaneous breathing, nonlabored ventilation, respiratory function stable and patient connected to nasal cannula oxygen Cardiovascular status: blood pressure returned to baseline and stable Postop Assessment: no apparent nausea or vomiting Anesthetic complications: no   No notable events documented.  Last Vitals:  Vitals:   12/08/21 1600 12/08/21 1630  BP: (!) 88/47 93/60  Pulse: (!) 59 66  Resp: 16 18  Temp:  36.5 C  SpO2: 97% 94%    Last Pain:  Vitals:   12/08/21 1630  TempSrc:   PainSc: [place]

## 2021-12-09 ENCOUNTER — Encounter (HOSPITAL_COMMUNITY): Payer: Self-pay

## 2021-12-09 ENCOUNTER — Other Ambulatory Visit: Payer: Self-pay

## 2021-12-09 ENCOUNTER — Encounter (HOSPITAL_COMMUNITY)
Admission: EM | Disposition: A | Discharge status: Home / Self Care | Payer: Self-pay | Source: Home / Self Care | Attending: Emergency Medicine

## 2021-12-09 ENCOUNTER — Emergency Department (HOSPITAL_COMMUNITY): Payer: Medicaid Other

## 2021-12-09 ENCOUNTER — Observation Stay (HOSPITAL_COMMUNITY)
Admission: EM | Admit: 2021-12-09 | Discharge: 2021-12-10 | Disposition: A | Discharge status: Home / Self Care | Payer: Medicaid Other | Attending: Emergency Medicine | Admitting: Emergency Medicine

## 2021-12-09 ENCOUNTER — Emergency Department (HOSPITAL_COMMUNITY): Payer: Medicaid Other | Admitting: Anesthesiology

## 2021-12-09 DIAGNOSIS — E039 Hypothyroidism, unspecified: Secondary | ICD-10-CM

## 2021-12-09 DIAGNOSIS — Z20822 Contact with and (suspected) exposure to covid-19: Secondary | ICD-10-CM | POA: Insufficient documentation

## 2021-12-09 DIAGNOSIS — E119 Type 2 diabetes mellitus without complications: Secondary | ICD-10-CM

## 2021-12-09 DIAGNOSIS — R55 Syncope and collapse: Secondary | ICD-10-CM | POA: Diagnosis present

## 2021-12-09 DIAGNOSIS — L7631 Postprocedural hematoma of skin and subcutaneous tissue following a dermatologic procedure: Principal | ICD-10-CM | POA: Insufficient documentation

## 2021-12-09 DIAGNOSIS — Z980 Intestinal bypass and anastomosis status: Secondary | ICD-10-CM | POA: Diagnosis not present

## 2021-12-09 DIAGNOSIS — I10 Essential (primary) hypertension: Secondary | ICD-10-CM | POA: Diagnosis not present

## 2021-12-09 DIAGNOSIS — M793 Panniculitis, unspecified: Secondary | ICD-10-CM | POA: Diagnosis present

## 2021-12-09 DIAGNOSIS — L304 Erythema intertrigo: Secondary | ICD-10-CM | POA: Diagnosis present

## 2021-12-09 DIAGNOSIS — R579 Shock, unspecified: Secondary | ICD-10-CM

## 2021-12-09 DIAGNOSIS — R58 Hemorrhage, not elsewhere classified: Secondary | ICD-10-CM | POA: Diagnosis not present

## 2021-12-09 DIAGNOSIS — L7632 Postprocedural hematoma of skin and subcutaneous tissue following other procedure: Principal | ICD-10-CM | POA: Diagnosis present

## 2021-12-09 DIAGNOSIS — Z9889 Other specified postprocedural states: Secondary | ICD-10-CM

## 2021-12-09 DIAGNOSIS — I9589 Other hypotension: Secondary | ICD-10-CM | POA: Insufficient documentation

## 2021-12-09 DIAGNOSIS — I9581 Postprocedural hypotension: Secondary | ICD-10-CM | POA: Diagnosis not present

## 2021-12-09 DIAGNOSIS — D62 Acute posthemorrhagic anemia: Secondary | ICD-10-CM | POA: Diagnosis present

## 2021-12-09 DIAGNOSIS — Z6841 Body Mass Index (BMI) 40.0 and over, adult: Secondary | ICD-10-CM

## 2021-12-09 DIAGNOSIS — W1839XA Other fall on same level, initial encounter: Secondary | ICD-10-CM | POA: Diagnosis present

## 2021-12-09 DIAGNOSIS — Y92009 Unspecified place in unspecified non-institutional (private) residence as the place of occurrence of the external cause: Secondary | ICD-10-CM

## 2021-12-09 DIAGNOSIS — S80219A Abrasion, unspecified knee, initial encounter: Secondary | ICD-10-CM | POA: Diagnosis present

## 2021-12-09 DIAGNOSIS — Y838 Other surgical procedures as the cause of abnormal reaction of the patient, or of later complication, without mention of misadventure at the time of the procedure: Secondary | ICD-10-CM | POA: Diagnosis present

## 2021-12-09 DIAGNOSIS — Z9884 Bariatric surgery status: Secondary | ICD-10-CM

## 2021-12-09 HISTORY — PX: HEMATOMA EVACUATION: SHX5118

## 2021-12-09 HISTORY — PX: I & D EXTREMITY: SHX5045

## 2021-12-09 LAB — I-STAT CHEM 8, ED
BUN: 25 mg/dL — ABNORMAL HIGH (ref 6–20)
Calcium, Ion: 1.02 mmol/L — ABNORMAL LOW (ref 1.15–1.40)
Chloride: 101 mmol/L (ref 98–111)
Creatinine, Ser: 1.8 mg/dL — ABNORMAL HIGH (ref 0.44–1.00)
Glucose, Bld: 225 mg/dL — ABNORMAL HIGH (ref 70–99)
HCT: 18 % — ABNORMAL LOW (ref 36.0–46.0)
Hemoglobin: 6.1 g/dL — CL (ref 12.0–15.0)
Potassium: 3.7 mmol/L (ref 3.5–5.1)
Sodium: 133 mmol/L — ABNORMAL LOW (ref 135–145)
TCO2: 20 mmol/L — ABNORMAL LOW (ref 22–32)

## 2021-12-09 LAB — COMPREHENSIVE METABOLIC PANEL
ALT: 10 U/L (ref 0–44)
AST: 13 U/L — ABNORMAL LOW (ref 15–41)
Albumin: 2.5 g/dL — ABNORMAL LOW (ref 3.5–5.0)
Alkaline Phosphatase: 47 U/L (ref 38–126)
Anion gap: 8 (ref 5–15)
BUN: 30 mg/dL — ABNORMAL HIGH (ref 6–20)
CO2: 19 mmol/L — ABNORMAL LOW (ref 22–32)
Calcium: 7.3 mg/dL — ABNORMAL LOW (ref 8.9–10.3)
Chloride: 104 mmol/L (ref 98–111)
Creatinine, Ser: 1.75 mg/dL — ABNORMAL HIGH (ref 0.44–1.00)
GFR, Estimated: 36 mL/min — ABNORMAL LOW (ref >60)
Glucose, Bld: 234 mg/dL — ABNORMAL HIGH (ref 70–99)
Potassium: 3.7 mmol/L (ref 3.5–5.1)
Sodium: 131 mmol/L — ABNORMAL LOW (ref 135–145)
Total Bilirubin: 0.2 mg/dL — ABNORMAL LOW (ref 0.3–1.2)
Total Protein: 4.3 g/dL — ABNORMAL LOW (ref 6.5–8.1)

## 2021-12-09 LAB — BPAM FFP
Blood Product Expiration Date: 202302212359
Blood Product Expiration Date: 202302222359
Blood Product Expiration Date: 202302232359
Blood Product Expiration Date: 202302252359
ISSUE DATE / TIME: 202302111110
ISSUE DATE / TIME: 202302111110
ISSUE DATE / TIME: 202302111110
ISSUE DATE / TIME: 202302111110
Unit Type and Rh: 6200
Unit Type and Rh: 6200
Unit Type and Rh: 6200
Unit Type and Rh: 6200

## 2021-12-09 LAB — PREPARE FRESH FROZEN PLASMA
Unit division: 0
Unit division: 0
Unit division: 0
Unit division: 0

## 2021-12-09 LAB — GLUCOSE, CAPILLARY: Glucose-Capillary: 175 mg/dL — ABNORMAL HIGH (ref 70–99)

## 2021-12-09 LAB — CBC WITH DIFFERENTIAL/PLATELET
Abs Immature Granulocytes: 0.07 10*3/uL (ref 0.00–0.07)
Basophils Absolute: 0 10*3/uL (ref 0.0–0.1)
Basophils Relative: 0 %
Eosinophils Absolute: 0 10*3/uL (ref 0.0–0.5)
Eosinophils Relative: 0 %
HCT: 19.7 % — ABNORMAL LOW (ref 36.0–46.0)
Hemoglobin: 6.4 g/dL — CL (ref 12.0–15.0)
Immature Granulocytes: 1 %
Lymphocytes Relative: 15 %
Lymphs Abs: 1.7 10*3/uL (ref 0.7–4.0)
MCH: 30 pg (ref 26.0–34.0)
MCHC: 32.5 g/dL (ref 30.0–36.0)
MCV: 92.5 fL (ref 80.0–100.0)
Monocytes Absolute: 1.5 10*3/uL — ABNORMAL HIGH (ref 0.1–1.0)
Monocytes Relative: 13 %
Neutro Abs: 7.9 10*3/uL — ABNORMAL HIGH (ref 1.7–7.7)
Neutrophils Relative %: 71 %
Platelets: 266 10*3/uL (ref 150–400)
RBC: 2.13 MIL/uL — ABNORMAL LOW (ref 3.87–5.11)
RDW: 15 % (ref 11.5–15.5)
WBC: 11.2 10*3/uL — ABNORMAL HIGH (ref 4.0–10.5)
nRBC: 0 % (ref 0.0–0.2)

## 2021-12-09 LAB — POCT I-STAT, CHEM 8
BUN: 25 mg/dL — ABNORMAL HIGH (ref 6–20)
Calcium, Ion: 1.1 mmol/L — ABNORMAL LOW (ref 1.15–1.40)
Chloride: 101 mmol/L (ref 98–111)
Creatinine, Ser: 1.5 mg/dL — ABNORMAL HIGH (ref 0.44–1.00)
Glucose, Bld: 172 mg/dL — ABNORMAL HIGH (ref 70–99)
HCT: 24 % — ABNORMAL LOW (ref 36.0–46.0)
Hemoglobin: 8.2 g/dL — ABNORMAL LOW (ref 12.0–15.0)
Potassium: 4.2 mmol/L (ref 3.5–5.1)
Sodium: 133 mmol/L — ABNORMAL LOW (ref 135–145)
TCO2: 22 mmol/L (ref 22–32)

## 2021-12-09 LAB — PROTIME-INR
INR: 1.1 (ref 0.8–1.2)
Prothrombin Time: 14.5 seconds (ref 11.4–15.2)

## 2021-12-09 LAB — PREPARE RBC (CROSSMATCH)

## 2021-12-09 LAB — APTT: aPTT: 24 seconds (ref 24–36)

## 2021-12-09 LAB — RESP PANEL BY RT-PCR (FLU A&B, COVID) ARPGX2
Influenza A by PCR: NEGATIVE
Influenza B by PCR: NEGATIVE
SARS Coronavirus 2 by RT PCR: NEGATIVE

## 2021-12-09 LAB — I-STAT BETA HCG BLOOD, ED (NOT ORDERABLE): I-stat hCG, quantitative: <5 m[IU]/mL (ref <5)

## 2021-12-09 SURGERY — IRRIGATION AND DEBRIDEMENT EXTREMITY
Anesthesia: General

## 2021-12-09 MED ORDER — LACTATED RINGERS IV SOLN: INTRAVENOUS | Status: DC

## 2021-12-09 MED ORDER — SUGAMMADEX SODIUM 200 MG/2ML IV SOLN
INTRAVENOUS | Status: DC | PRN
Start: 2021-12-09 — End: 2021-12-09
  Administered 2021-12-09: 200 mg via INTRAVENOUS

## 2021-12-09 MED ORDER — SUCCINYLCHOLINE CHLORIDE 200 MG/10ML IV SOSY
PREFILLED_SYRINGE | INTRAVENOUS | Status: DC | PRN
  Administered 2021-12-09: 100 mg via INTRAVENOUS

## 2021-12-09 MED ORDER — LIDOCAINE 2% (20 MG/ML) 5 ML SYRINGE
INTRAMUSCULAR | Status: DC | PRN
Start: 2021-12-09 — End: 2021-12-09
  Administered 2021-12-09: 60 mg via INTRAVENOUS

## 2021-12-09 MED ORDER — FENTANYL CITRATE (PF) 250 MCG/5ML IJ SOLN
INTRAMUSCULAR | Status: DC | PRN
  Administered 2021-12-09 (×2): 50 ug via INTRAVENOUS
  Administered 2021-12-09: 100 ug via INTRAVENOUS
  Administered 2021-12-09: 50 ug via INTRAVENOUS

## 2021-12-09 MED ORDER — SODIUM CHLORIDE 0.9 % IV SOLN: INTRAVENOUS | Status: DC | PRN

## 2021-12-09 MED ORDER — ROCURONIUM BROMIDE 10 MG/ML (PF) SYRINGE
PREFILLED_SYRINGE | INTRAVENOUS | Status: DC | PRN
Start: 2021-12-09 — End: 2021-12-09
  Administered 2021-12-09: 20 mg via INTRAVENOUS
  Administered 2021-12-09: 70 mg via INTRAVENOUS

## 2021-12-09 MED ORDER — CHLORHEXIDINE GLUCONATE 0.12 % MT SOLN: 15.0000 mL | Freq: Once | OROMUCOSAL | Status: AC

## 2021-12-09 MED ORDER — ACETAMINOPHEN 650 MG RE SUPP: 650.0000 mg | Freq: Four times a day (QID) | RECTAL | Status: DC | PRN

## 2021-12-09 MED ORDER — FENTANYL CITRATE (PF) 100 MCG/2ML IJ SOLN
25.0000 ug | INTRAMUSCULAR | Status: DC | PRN
  Administered 2021-12-09: 50 ug via INTRAVENOUS

## 2021-12-09 MED ORDER — PROPOFOL 10 MG/ML IV BOLUS
INTRAVENOUS | Status: DC | PRN
  Administered 2021-12-09: 160 mg via INTRAVENOUS

## 2021-12-09 MED ORDER — ONDANSETRON HCL 4 MG/2ML IJ SOLN: 4.0000 mg | Freq: Four times a day (QID) | INTRAMUSCULAR | Status: DC | PRN

## 2021-12-09 MED ORDER — HEMOSTATIC AGENTS (NO CHARGE) OPTIME
TOPICAL | Status: DC | PRN
  Administered 2021-12-09 (×3): 1 via TOPICAL

## 2021-12-09 MED ORDER — ACETAMINOPHEN 325 MG PO TABS
650.0000 mg | ORAL_TABLET | Freq: Four times a day (QID) | ORAL | Status: DC | PRN
  Administered 2021-12-10: 650 mg via ORAL
  Filled 2021-12-09: qty 2

## 2021-12-09 MED ORDER — LACTATED RINGERS IV BOLUS
1000.0000 mL | Freq: Once | INTRAVENOUS | Status: AC
  Administered 2021-12-09: 1000 mL via INTRAVENOUS

## 2021-12-09 MED ORDER — PROPOFOL 10 MG/ML IV BOLUS
INTRAVENOUS | Status: AC
  Filled 2021-12-09: qty 20

## 2021-12-09 MED ORDER — SODIUM CHLORIDE 0.9 % IR SOLN: Status: DC | PRN

## 2021-12-09 MED ORDER — TRANEXAMIC ACID-NACL 1000-0.7 MG/100ML-% IV SOLN
1000.0000 mg | INTRAVENOUS | Status: AC
  Administered 2021-12-09: 1000 mg via INTRAVENOUS
  Filled 2021-12-09: qty 100

## 2021-12-09 MED ORDER — ROCURONIUM BROMIDE 10 MG/ML (PF) SYRINGE
PREFILLED_SYRINGE | INTRAVENOUS | Status: AC
  Filled 2021-12-09: qty 10

## 2021-12-09 MED ORDER — METHOCARBAMOL 500 MG PO TABS: 500.0000 mg | ORAL_TABLET | Freq: Four times a day (QID) | ORAL | Status: DC | PRN

## 2021-12-09 MED ORDER — FLUOXETINE HCL 20 MG PO CAPS
40.0000 mg | ORAL_CAPSULE | Freq: Every day | ORAL | Status: DC
  Filled 2021-12-09: qty 2

## 2021-12-09 MED ORDER — OXYCODONE HCL 5 MG PO TABS: 5.0000 mg | ORAL_TABLET | Freq: Once | ORAL | Status: DC | PRN

## 2021-12-09 MED ORDER — PHENYLEPHRINE 40 MCG/ML (10ML) SYRINGE FOR IV PUSH (FOR BLOOD PRESSURE SUPPORT)
PREFILLED_SYRINGE | INTRAVENOUS | Status: DC | PRN
  Administered 2021-12-09 (×2): 120 ug via INTRAVENOUS

## 2021-12-09 MED ORDER — OXYCODONE HCL 5 MG PO TABS: 5.0000 mg | ORAL_TABLET | ORAL | Status: DC | PRN

## 2021-12-09 MED ORDER — MIDAZOLAM HCL 5 MG/5ML IJ SOLN
INTRAMUSCULAR | Status: DC | PRN
  Administered 2021-12-09: 2 mg via INTRAVENOUS

## 2021-12-09 MED ORDER — PHENYLEPHRINE HCL-NACL 20-0.9 MG/250ML-% IV SOLN
INTRAVENOUS | Status: DC | PRN
  Administered 2021-12-09: 50 ug/min via INTRAVENOUS

## 2021-12-09 MED ORDER — CHLORHEXIDINE GLUCONATE 0.12 % MT SOLN
OROMUCOSAL | Status: AC
  Administered 2021-12-09: 15 mL via OROMUCOSAL
  Filled 2021-12-09: qty 15

## 2021-12-09 MED ORDER — OXYCODONE HCL 5 MG/5ML PO SOLN: 5.0000 mg | Freq: Once | ORAL | Status: DC | PRN

## 2021-12-09 MED ORDER — FENTANYL CITRATE (PF) 100 MCG/2ML IJ SOLN
INTRAMUSCULAR | Status: AC
  Filled 2021-12-09: qty 2

## 2021-12-09 MED ORDER — MIDAZOLAM HCL 2 MG/2ML IJ SOLN
INTRAMUSCULAR | Status: AC
  Filled 2021-12-09: qty 2

## 2021-12-09 MED ORDER — ONDANSETRON HCL 4 MG/2ML IJ SOLN
INTRAMUSCULAR | Status: AC
  Filled 2021-12-09: qty 2

## 2021-12-09 MED ORDER — DEXAMETHASONE SODIUM PHOSPHATE 10 MG/ML IJ SOLN
INTRAMUSCULAR | Status: AC
  Filled 2021-12-09: qty 1

## 2021-12-09 MED ORDER — LEVOTHYROXINE SODIUM 100 MCG PO TABS
200.0000 ug | ORAL_TABLET | Freq: Every day | ORAL | Status: DC
  Administered 2021-12-10: 200 ug via ORAL
  Filled 2021-12-09: qty 2

## 2021-12-09 MED ORDER — DEXAMETHASONE SODIUM PHOSPHATE 10 MG/ML IJ SOLN
INTRAMUSCULAR | Status: DC | PRN
  Administered 2021-12-09: 4 mg via INTRAVENOUS

## 2021-12-09 MED ORDER — 0.9 % SODIUM CHLORIDE (POUR BTL) OPTIME
TOPICAL | Status: DC | PRN
  Administered 2021-12-09 (×2): 1000 mL

## 2021-12-09 MED ORDER — LACTATED RINGERS IV SOLN
INTRAVENOUS | Status: DC | PRN
Start: 2021-12-09 — End: 2021-12-09

## 2021-12-09 MED ORDER — SODIUM CHLORIDE 0.9 % IV SOLN: 10.0000 mL/h | Freq: Once | INTRAVENOUS | Status: DC

## 2021-12-09 MED ORDER — ALBUMIN HUMAN 5 % IV SOLN: INTRAVENOUS | Status: DC | PRN

## 2021-12-09 MED ORDER — SODIUM CHLORIDE 0.9 % IV SOLN: INTRAVENOUS | Status: DC

## 2021-12-09 MED ORDER — ONDANSETRON HCL 4 MG/2ML IJ SOLN
INTRAMUSCULAR | Status: DC | PRN
  Administered 2021-12-09: 4 mg via INTRAVENOUS

## 2021-12-09 MED ORDER — CEFAZOLIN SODIUM-DEXTROSE 2-3 GM-%(50ML) IV SOLR
INTRAVENOUS | Status: DC | PRN
  Administered 2021-12-09: 2 g via INTRAVENOUS

## 2021-12-09 MED ORDER — FENTANYL CITRATE (PF) 250 MCG/5ML IJ SOLN
INTRAMUSCULAR | Status: AC
  Filled 2021-12-09: qty 5

## 2021-12-09 MED ORDER — INSULIN ASPART 100 UNIT/ML IJ SOLN: 0.0000 [IU] | Freq: Three times a day (TID) | INTRAMUSCULAR | Status: DC

## 2021-12-09 MED ORDER — ORAL CARE MOUTH RINSE: 15.0000 mL | Freq: Once | OROMUCOSAL | Status: AC

## 2021-12-09 MED ORDER — CEFAZOLIN SODIUM-DEXTROSE 2-4 GM/100ML-% IV SOLN
INTRAVENOUS | Status: AC
  Filled 2021-12-09: qty 100

## 2021-12-09 MED ORDER — ONDANSETRON 4 MG PO TBDP: 4.0000 mg | ORAL_TABLET | Freq: Four times a day (QID) | ORAL | Status: DC | PRN

## 2021-12-09 SURGICAL SUPPLY — 36 items
ADH SKN CLS APL DERMABOND .7 (GAUZE/BANDAGES/DRESSINGS) ×6
APL PRP STRL LF DISP 70% ISPRP (MISCELLANEOUS) ×4
BAG COUNTER SPONGE SURGICOUNT (BAG) ×3 IMPLANT
BAG SPNG CNTER NS LX DISP (BAG) ×2
BINDER ABDOMINAL 12 ML 46-62 (SOFTGOODS) ×1 IMPLANT
BNDG ELASTIC 4X5.8 VLCR STR LF (GAUZE/BANDAGES/DRESSINGS) IMPLANT
BNDG GAUZE ELAST 4 BULKY (GAUZE/BANDAGES/DRESSINGS) IMPLANT
CHLORAPREP W/TINT 26 (MISCELLANEOUS) ×2 IMPLANT
COVER SURGICAL LIGHT HANDLE (MISCELLANEOUS) ×3 IMPLANT
DERMABOND ADVANCED (GAUZE/BANDAGES/DRESSINGS) ×3
DERMABOND ADVANCED .7 DNX12 (GAUZE/BANDAGES/DRESSINGS) IMPLANT
DRAPE HALF SHEET 40X57 (DRAPES) IMPLANT
DRSG PAD ABDOMINAL 8X10 ST (GAUZE/BANDAGES/DRESSINGS) IMPLANT
ELECT COATED BLADE 2.86 ST (ELECTRODE) ×1 IMPLANT
ELECT REM PT RETURN 9FT ADLT (ELECTROSURGICAL) ×3
ELECTRODE REM PT RTRN 9FT ADLT (ELECTROSURGICAL) ×2 IMPLANT
EVACUATOR SILICONE 100CC (DRAIN) ×1 IMPLANT
GAUZE SPONGE 4X4 12PLY STRL (GAUZE/BANDAGES/DRESSINGS) IMPLANT
GLOVE SURG ENC MOIS LTX SZ6 (GLOVE) ×3 IMPLANT
GOWN STRL REUS W/ TWL LRG LVL3 (GOWN DISPOSABLE) ×4 IMPLANT
GOWN STRL REUS W/TWL LRG LVL3 (GOWN DISPOSABLE) ×6
HEMOSTAT ARISTA ABSORB 3G PWDR (HEMOSTASIS) ×3 IMPLANT
KIT BASIN OR (CUSTOM PROCEDURE TRAY) ×3 IMPLANT
KIT TURNOVER KIT B (KITS) ×3 IMPLANT
NS IRRIG 1000ML POUR BTL (IV SOLUTION) ×4 IMPLANT
PACK GENERAL/GYN (CUSTOM PROCEDURE TRAY) ×3 IMPLANT
PAD ABD 8X10 STRL (GAUZE/BANDAGES/DRESSINGS) ×5 IMPLANT
PAD ARMBOARD 7.5X6 YLW CONV (MISCELLANEOUS) ×5 IMPLANT
STAPLER VISISTAT 35W (STAPLE) ×1 IMPLANT
SUT MNCRL AB 4-0 PS2 18 (SUTURE) ×2 IMPLANT
SUT PDS AB 0 CT1 27 (SUTURE) ×3 IMPLANT
SUT VLOC 180 0 24IN GS25 (SUTURE) ×2 IMPLANT
TOWEL GREEN STERILE (TOWEL DISPOSABLE) ×3 IMPLANT
TOWEL GREEN STERILE FF (TOWEL DISPOSABLE) ×3 IMPLANT
TRAY FOLEY MTR SLVR 14FR STAT (SET/KITS/TRAYS/PACK) ×1 IMPLANT
UNDERPAD 30X36 HEAVY ABSORB (UNDERPADS AND DIAPERS) ×3 IMPLANT

## 2021-12-09 NOTE — Anesthesia Procedure Notes (Signed)
Procedure Name: Intubation Date/Time: 12/09/2021 12:07 PM Performed by: Renato Shin, CRNA Pre-anesthesia Checklist: Patient identified, Emergency Drugs available, Suction available and Patient being monitored Patient Re-evaluated:Patient Re-evaluated prior to induction Oxygen Delivery Method: Circle system utilized Preoxygenation: Pre-oxygenation with 100% oxygen Induction Type: IV induction, Rapid sequence and Cricoid Pressure applied Laryngoscope Size: Miller and 3 Grade View: Grade II Tube type: Oral Tube size: 7.0 mm Number of attempts: 1 Airway Equipment and Method: Stylet and Oral airway Placement Confirmation: ETT inserted through vocal cords under direct vision, positive ETCO2 and breath sounds checked- equal and bilateral Secured at: 21 cm Tube secured with: Tape Dental Injury: Teeth and Oropharynx as per pre-operative assessment

## 2021-12-09 NOTE — Progress Notes (Signed)
Report attempted x 1

## 2021-12-09 NOTE — Anesthesia Preprocedure Evaluation (Signed)
Anesthesia Evaluation  Patient identified by MRN, date of birth, ID band Patient awake    Reviewed: Allergy & Precautions, H&P , NPO status , Patient's Chart, lab work & pertinent test results  Airway Mallampati: II   Neck ROM: full    Dental   Pulmonary sleep apnea ,    breath sounds clear to auscultation       Cardiovascular hypertension,  Rhythm:regular Rate:Normal     Neuro/Psych PSYCHIATRIC DISORDERS Anxiety Depression  Neuromuscular disease    GI/Hepatic GERD  ,  Endo/Other  diabetes, Type 2Hypothyroidism Hgb 6.1 at 1038 today, then 2 units PRBC was given.  Need to recheck H/H.  Renal/GU ARFRenal diseaseCr 1.8     Musculoskeletal   Abdominal   Peds  Hematology  (+) Blood dyscrasia, anemia ,   Anesthesia Other Findings   Reproductive/Obstetrics                             Anesthesia Physical Anesthesia Plan  ASA: 3 and emergent  Anesthesia Plan: General   Post-op Pain Management:    Induction: Intravenous, Rapid sequence and Cricoid pressure planned  PONV Risk Score and Plan: 3 and Ondansetron, Dexamethasone, Midazolam and Treatment may vary due to age or medical condition  Airway Management Planned: Oral ETT  Additional Equipment:   Intra-op Plan:   Post-operative Plan: Extubation in OR  Informed Consent: I have reviewed the patients History and Physical, chart, labs and discussed the procedure including the risks, benefits and alternatives for the proposed anesthesia with the patient or authorized representative who has indicated his/her understanding and acceptance.     Dental advisory given  Plan Discussed with: CRNA, Anesthesiologist and Surgeon  Anesthesia Plan Comments:         Anesthesia Quick Evaluation

## 2021-12-09 NOTE — ED Triage Notes (Signed)
Pt here via EMS d/t syncopal episode, Pt attempting to get out of car, became dizzy and was syncopal. Caught by EMS. Alert and oriented X4. Recent skin removal surgery approx 11 lbs d/t weight loss. CBG 299. Not currently taking diabetic meds.  Bi-lateral abdominal drains.   732mL NS 18g LAC  100/72

## 2021-12-09 NOTE — Progress Notes (Signed)
Pt admitted to rm 3 from PACU. CHG wipe given. Initiated tele. Oriented pt to the unit. Call bell within reach.   Lavenia Atlas, RN

## 2021-12-09 NOTE — ED Provider Notes (Signed)
[facility] EMERGENCY DEPARTMENT Provider Note   CSN: [id number] Arrival date & time: 12/09/21  1012     History  Chief Complaint  Patient presents with   Loss of Consciousness    [name] is a 45 y.o. female.  HPI 46 year old female presents with syncope.  History is from the patient as well as the bedside nurse, who spoke to EMS.  Patient had surgery yesterday by Dr. Iran Planas.  She has bilateral abdominal drains.  She spent the night at the surgical center and then was released this morning a couple hours ago.  She was apparently lightheaded but doing okay enough to be discharged.  She was told to hold her blood pressure meds.  When she got home and got out of the car she got dizzy and passed out and her husband had to try and break her fall.  She still scraped her knee but is otherwise feeling okay.  No other injuries.  Still feels lightheaded when standing up and went going to the EMS stretcher, the nurse reports that the patient passed out again.  She was given IV fluids by EMS.  Patient reports some abdominal soreness but no abdominal pain. No blood thinner use. She has a history of prior anemia requiring blood transfusions.  Home Medications Prior to Admission medications   Medication Sig Start Date End Date Taking? Authorizing Provider  clindamycin (CLINDAGEL) 1 % gel Apply 1 application topically daily as needed (redness of face).    [provider]  diclofenac Sodium (VOLTAREN) 1 % GEL Apply 2 g topically daily as needed (pain).     [provider]  FLUoxetine (PROZAC) 40 MG capsule Take 40 mg by mouth daily. 05/29/21   [provider]  fluticasone (FLONASE) 50 MCG/ACT nasal spray Place into the nose. 05/29/21   [provider]  levothyroxine (SYNTHROID, LEVOTHROID) 200 MCG tablet Take 1 tablet (200 mcg total) by mouth daily before breakfast. Patient taking differently: Take 200 mcg by mouth daily at 2 am. 12/03/18    Renato Shin, MD  nystatin (MYCOSTATIN/NYSTOP) powder Apply 1 application topically 2 (two) times daily.    [provider]  nystatin cream (MYCOSTATIN) Apply 1 application topically See admin instructions. Twice a week    [provider]  olmesartan (BENICAR) 5 MG tablet Take by mouth daily.    [provider]      Allergies    Food, Peach [prunus persica], Shrimp [shellfish allergy], Plasticized base [plastibase], and Tape    Review of Systems   Review of Systems  Cardiovascular:  Negative for chest pain.  Gastrointestinal:  Negative for abdominal pain.  Neurological:  Positive for syncope and light-headedness.   Physical Exam Updated Vital Signs Ht 5\' 2"  (1.575 m)    Wt 99.6 kg    BMI 40.16 kg/m  Physical Exam Vitals and nursing note reviewed.  Constitutional:      Appearance: She is well-developed. She is obese.  HENT:     Head: Normocephalic and atraumatic.  Cardiovascular:     Rate and Rhythm: Normal rate and regular rhythm.     Heart sounds: Normal heart sounds.  Pulmonary:     Effort: Pulmonary effort is normal.  Abdominal:     Palpations: Abdomen is soft.     Tenderness: There is no abdominal tenderness.     Comments: Clean, dry, intact lower abdominal/pelvic incision.  Bilateral lateral abdominal drains are draining blood though it appears to be  at a fairly quick rate.  Skin:    General: Skin is warm and dry.  Neurological:     Mental Status: She is alert.    ED Results / Procedures / Treatments   Labs (all labs ordered are listed, but only abnormal results are displayed) Labs Reviewed  CBC WITH DIFFERENTIAL/PLATELET - Abnormal; Notable for the following components:      Result Value   WBC 11.2 (*)    RBC 2.13 (*)    Hemoglobin 6.4 (*)    HCT 19.7 (*)    Neutro Abs 7.9 (*)    Monocytes Absolute 1.5 (*)    All other components within normal limits  COMPREHENSIVE METABOLIC PANEL - Abnormal; Notable for the following components:    Sodium 131 (*)    CO2 19 (*)    Glucose, Bld 234 (*)    BUN 30 (*)    Creatinine, Ser 1.75 (*)    Calcium 7.3 (*)    Total Protein 4.3 (*)    Albumin 2.5 (*)    AST 13 (*)    Total Bilirubin 0.2 (*)    GFR, Estimated 36 (*)    All other components within normal limits  I-STAT CHEM 8, ED - Abnormal; Notable for the following components:   Sodium 133 (*)    BUN 25 (*)    Creatinine, Ser 1.80 (*)    Glucose, Bld 225 (*)    Calcium, Ion 1.02 (*)    TCO2 20 (*)    Hemoglobin 6.1 (*)    HCT 18.0 (*)    All other components within normal limits  POCT I-STAT, CHEM 8 - Abnormal; Notable for the following components:   Sodium 133 (*)    BUN 25 (*)    Creatinine, Ser 1.50 (*)    Glucose, Bld 172 (*)    Calcium, Ion 1.10 (*)    Hemoglobin 8.2 (*)    HCT 24.0 (*)    All other components within normal limits  RESP PANEL BY RT-PCR (FLU A&B, COVID) ARPGX2  PROTIME-INR  APTT  I-STAT BETA HCG BLOOD, ED ([facility], [facility], [facility] ONLY)  I-STAT BETA HCG BLOOD, ED (NOT ORDERABLE)  TYPE AND SCREEN  PREPARE RBC (CROSSMATCH)  PREPARE RBC (CROSSMATCH)    EKG EKG Interpretation  Date/Time:  Saturday December 09 2021 10:24:23 EST Ventricular Rate:  68 PR Interval:  132 QRS Duration: 76 QT Interval:  440 QTC Calculation: 468 R Axis:   49 Text Interpretation: Sinus rhythm Abnormal R-wave progression, early transition Borderline T wave abnormalities Confirmed by Sherwood Gambler [phone]) on 12/09/2021 10:38:59 AM  Radiology DG Chest Portable 1 View  Result Date: 12/09/2021 CLINICAL DATA:  Chest pain.  Loss of consciousness. EXAM: PORTABLE CHEST 1 VIEW COMPARISON:  05/24/2016 FINDINGS: Cardiac silhouette is normal in size. Normal mediastinal and hilar contours. Lung volumes are low. Lungs are clear. No convincing pleural effusion or pneumothorax. Skeletal structures are grossly intact. IMPRESSION: No active disease. Electronically Signed   By: Lajean Manes M.D.   On: 12/09/2021 12:08     Procedures .Critical Care Performed by: Sherwood Gambler, MD Authorized by: Sherwood Gambler, MD   Critical care provider statement:    Critical care time (minutes):  45   Critical care time was exclusive of:  Separately billable procedures and treating other patients   Critical care was necessary to treat or prevent imminent or life-threatening deterioration of the following conditions:  Shock   Critical care was time spent personally by me  on the following activities:  Development of treatment plan with patient or surrogate, discussions with consultants, evaluation of patient's response to treatment, examination of patient, ordering and review of laboratory studies, ordering and review of radiographic studies, ordering and performing treatments and interventions, pulse oximetry, re-evaluation of patient's condition and review of old charts    Medications Ordered in ED Medications  lactated ringers bolus 1,000 mL (has no administration in time range)    ED Course/ Medical Decision Making/ A&P Clinical Course as of 12/09/21 1145  Sat Dec 09, 2021  1046 Patient's JP drains are draining at a fairly quick rate and we are having to empty them often.  Hemoglobin on the i-STAT is 6.1.  2 units emergency release ordered.  We will rapid infuse these.  I have placed a call for Dr. Iran Planas.  Otherwise we will try and support her hemodynamics with blood products. [SG]  1056 Discussed with Dr. Iran Planas, who will come see patient. [SG]  1106 BP is in the 70s despite 2 units of blood via rapid infuser.  We will start massive transfusion protocol.  Given significant bleeding will give TXA. [SG]  1106 Just after ordering massive transfusion, blood pressure has stabilized.  We will hold off on further blood products as she remained somewhat stable and Dr. Iran Planas is arranging for OR [SG]    Clinical Course User Index [SG] Sherwood Gambler, MD                           Medical Decision  Making Amount and/or Complexity of Data Reviewed Labs: ordered. Radiology: ordered.  Risk Prescription drug management. Decision regarding hospitalization.   Patient arrives with borderline hypotension and became frankly hypotensive as above.  Was given emergency release blood, 2 units.  This did fortunately bring up her blood pressure.  I discussed with her surgeon as above and she will be taken emergently to the OR.  Massive transfusion was canceled as above given the stabilization of her blood pressure.  Otherwise, she does have an acute kidney injury this probably related to the hypotension and hopefully will reverse with fluid resuscitation/blood resuscitation.  Of note, we got around 600 combined cc of blood from JP drains bilaterally.        Final Clinical Impression(s) / ED Diagnoses Final diagnoses:  Hypotension due to blood loss  Shock circulatory [facility])    Rx / DC Orders ED Discharge Orders     None         Sherwood Gambler, MD 12/09/21 1531

## 2021-12-09 NOTE — Discharge Summary (Signed)
Physician Discharge Summary  Patient ID: [name] MRN: [id number] DOB/AGE: 46/29/1977 46 y.o.  Admit date: 12/08/2021 Discharge date: 12/09/2021  Admission Diagnoses: Panniculitis  Discharge Diagnoses:  Principal Problem:   Panniculitis   Discharged Condition: stable  Hospital Course: Postoperatively patient had some hypotension and received IVF. On POD# 1 she was able to ambulate with minimal assist and without dizziness. Patient tolerated diet and reported minimal pain. Reviewed drain care bathing and activity restrictions.  Treatments: surgery: panniculectomy 2.10.23  Discharge Exam: Blood pressure (!) 88/44, pulse 66, temperature 98.9 F (37.2 C), resp. rate 18, height 5\' 2"  (1.575 m), weight 99.6 kg, last menstrual period 11/05/2021, SpO2 96 %. Incision/Wound: incision dry intact abdomen soft drains watery serosanguinous  Disposition: Discharge disposition: 01-Home or Self Care       Discharge Instructions     Call MD for:  redness, tenderness, or signs of infection (pain, swelling, bleeding, redness, odor or green/yellow discharge around incision site)   Complete by: As directed    Call MD for:  temperature >100.5   Complete by: As directed    Discharge instructions   Complete by: As directed    Ok to remove dressings and shower am 2.12.23. Soap and water ok, pat incisions dry. No creams or ointments over incisions. Do not let drains dangle in shower, attach to lanyard or similar.Strip and record drains twice daily and bring log to clinic visit.  Abdominal binder all other times.  Ok to raise arms above shoulders for bathing and dressing.  No house yard work or exercise until cleared by MD.   Patient received Rx preop. Also ok to use Tylenol for pain. Recommend Miralax or Dulcolax as needed for constipation.  Recommend sleep with 2-3 pillows beneath head and pillow beneath knees.   Driving Restrictions   Complete by: As directed    No driving if  taking prescription pain medication   Lifting restrictions   Complete by: As directed    No lifting > 5-10 lbs until cleared by MD   Resume previous diet   Complete by: As directed       Allergies as of 12/09/2021       Reactions   Food    RAW/FRESH FRUIT--PEACHES, PEARS, APPLES=THROAT CLOSES/SWELLING & COUGHING SHORTNESS OF BREATH   Peach [prunus Persica] Anaphylaxis   Shrimp [shellfish Allergy] Anaphylaxis   Plasticized Base [plastibase] Itching   Tape         Medication List     STOP taking these medications    lisinopril-hydrochlorothiazide 20-12.5 MG tablet Commonly known as: ZESTORETIC       TAKE these medications    clindamycin 1 % gel Commonly known as: CLINDAGEL Apply 1 application topically daily as needed (redness of face).   diclofenac Sodium 1 % Gel Commonly known as: VOLTAREN Apply 2 g topically daily as needed (pain).   FLUoxetine 40 MG capsule Commonly known as: PROZAC Take 40 mg by mouth daily.   fluticasone 50 MCG/ACT nasal spray Commonly known as: FLONASE Place into the nose.   levothyroxine 200 MCG tablet Commonly known as: SYNTHROID Take 1 tablet (200 mcg total) by mouth daily before breakfast. What changed: when to take this   nystatin cream Commonly known as: MYCOSTATIN Apply 1 application topically See admin instructions. Twice a week   nystatin powder Commonly known as: MYCOSTATIN/NYSTOP Apply 1 application topically 2 (two) times daily.   olmesartan 5 MG tablet Commonly known as: BENICAR Take by mouth daily.  Follow-up Information     Irene Limbo, MD Follow up in 1 week(s).   Specialty: Plastic Surgery Contact information: [address] [place] [address] [phone]                 Signed: Irene Limbo 12/09/2021, 7:49 AM

## 2021-12-09 NOTE — ED Notes (Signed)
Drains filling with bright red blood quickly after being emptied.

## 2021-12-09 NOTE — Transfer of Care (Signed)
Immediate Anesthesia Transfer of Care Note  Patient: [name]  Procedure(s) Performed: IRRIGATION AND DEBRIDEMENT ABDOMEN  Patient Location: PACU  Anesthesia Type:General  Level of Consciousness: awake and patient cooperative  Airway & Oxygen Therapy: Patient Spontanous Breathing and Patient connected to face mask oxygen  Post-op Assessment: Report given to RN and Post -op Vital signs reviewed and stable  Post vital signs: Reviewed and stable  Last Vitals:  Vitals Value Taken Time  BP 110/57 12/09/21 1409  Temp 36.8 C 12/09/21 1407  Pulse 77 12/09/21 1413  Resp 17 12/09/21 1413  SpO2 100 % 12/09/21 1413  Vitals shown include unvalidated device data.  Last Pain:  Vitals:   12/09/21 1201  TempSrc:   PainSc: 0-No pain      Patients Stated Pain Goal: 2 (72/27/73 7505)  Complications: No notable events documented.

## 2021-12-09 NOTE — Op Note (Signed)
Operative Note   DATE OF OPERATION: 2.11.2023  LOCATION: [facility]-in[facility]  SURGICAL DIVISION: Plastic Surgery  PREOPERATIVE DIAGNOSES:  1. Hematoma abdominal wall 2. S/p panniculectomy  POSTOPERATIVE DIAGNOSES:  same  PROCEDURE:  Evacuation hematoma abdomen  SURGEON: Irene Limbo MD MBA  ASSISTANT: none  ANESTHESIA:  General.   EBL: 122 ml  COMPLICATIONS: None immediate.   INDICATIONS FOR PROCEDURE:  The patient, [name], is a 46 y.o. female born on 01/26/1976, is here for operative exploration following panniculectomy with symptomatic hypotension, significant anemia on laboratory work up.   FINDINGS: Evacuated approximately 600 ml clot.   DESCRIPTION OF PROCEDURE:  The patient's operative site was marked with the patient in the preoperative area. The patient was taken to the operating room. SCDs were placed and IV antibiotics were given. The patient's operative site was prepped and draped in a sterile fashion. A time out was performed and all information was confirmed to be correct. Low transverse incision opened. Clot evacuated. Cavity irrigated copiously with saline. Examination of cavity with generalized ooze from tissue without arterial bleeding source noted. Cavity observed and no significant recurrent bleeding noted. Arista placed throughout cavity. Closure completed with 0 PDS in superficial fascia. 0 V lock used to close dermis and 4-0 monocryl for subcuticular skin closure. Dermabond applied to incision. Dry dressing and abdominal binder placed.  The patient was allowed to wake from anesthesia, extubated and taken to the recovery room in satisfactory condition.   SPECIMENS: none  DRAINS: 19 Fr JP in right and left abdomen

## 2021-12-09 NOTE — Discharge Instructions (Signed)

## 2021-12-09 NOTE — H&P (Addendum)
Subjective:     Patient ID: [name] is a 46 y.o. female.     1 day post op panniculectomy. Developed dizziness and fell following return to home this am. Transported by EMS. Has received 2 U PRBC in ED for hypotension and Hb 6.4 on arrival.  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 Highest weight 390 lb. Lowest weight post bypass 190 lb. Had pregnancy following bypass, delivered 02/2019.   OSA and DM resolved with weight loss. HbA1c 10/2021 5.4. HTN improved post weight loss- reports fewer medications.    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       Objective:   Physical Exam Cardiovascular:     Rate and Rhythm: Normal rate and regular rhythm.     Heart sounds: Normal heart sounds.  Pulmonary:     Effort: Pulmonary effort is normal.     Breath sounds: Normal breath sounds.  Abdominal:     incision intact soft drains sanguinous  Neurological:     Mental Status: She is oriented to person, place, and time.          Assessment:     S/p Roux en Y bypass Panniculitis Morbid obesity S/p panniculectomy    Plan:     Plan OR for exploration. Received 2 u PRBC. Patient remains alert oriented.

## 2021-12-10 LAB — BASIC METABOLIC PANEL
Anion gap: 8 (ref 5–15)
BUN: 14 mg/dL (ref 6–20)
CO2: 23 mmol/L (ref 22–32)
Calcium: 8.1 mg/dL — ABNORMAL LOW (ref 8.9–10.3)
Chloride: 108 mmol/L (ref 98–111)
Creatinine, Ser: 0.86 mg/dL (ref 0.44–1.00)
GFR, Estimated: >60 mL/min (ref >60)
Glucose, Bld: 137 mg/dL — ABNORMAL HIGH (ref 70–99)
Potassium: 3.9 mmol/L (ref 3.5–5.1)
Sodium: 139 mmol/L (ref 135–145)

## 2021-12-10 LAB — CBC
HCT: 28.5 % — ABNORMAL LOW (ref 36.0–46.0)
Hemoglobin: 9.5 g/dL — ABNORMAL LOW (ref 12.0–15.0)
MCH: 29 pg (ref 26.0–34.0)
MCHC: 33.3 g/dL (ref 30.0–36.0)
MCV: 86.9 fL (ref 80.0–100.0)
Platelets: 186 10*3/uL (ref 150–400)
RBC: 3.28 MIL/uL — ABNORMAL LOW (ref 3.87–5.11)
RDW: 18 % — ABNORMAL HIGH (ref 11.5–15.5)
WBC: 9.4 10*3/uL (ref 4.0–10.5)
nRBC: 0 % (ref 0.0–0.2)

## 2021-12-10 LAB — GLUCOSE, CAPILLARY: Glucose-Capillary: 105 mg/dL — ABNORMAL HIGH (ref 70–99)

## 2021-12-10 NOTE — Progress Notes (Addendum)
Educated pt on the importance of using the incentive spirometer. Pt demonstrated use correctly. Pt verbalized that she understood how to use the spirometer, the importance of using it, and how frequently to use it.

## 2021-12-10 NOTE — Discharge Summary (Signed)
Physician Discharge Summary  Patient ID: [name] MRN: [id number] DOB/AGE: [birth year] 46 y.o.  Admit date: 12/09/2021 Discharge date: 12/10/2021  Admission Diagnoses: Hematoma abdominal wall S/p panniculectomy  Discharge Diagnoses:  same  Discharged Condition: stable  Hospital Course: Patient readmitted POD#1 panniculectomy with hypotension and increased JP drain output. Patient underwent resuscitation in ED including 2 u PRBC. Patient taken to OR for exploration and hematoma evacuated. Patient received additional unit PRBC intraoperatively. Post operatively patient did not have hypotension and tolerated diet, reported minimal pain. Patient ambulatory with minimal assist and without hypotension. She was able to void post Foley removal. Post operative laboratory with appropriate response to transfusion.  Treatments: surgery: evacuation hematoma abdominal wall 2.11.2023  Discharge Exam: Blood pressure 125/61, pulse 60, temperature 98.5 F (36.9 C), temperature source Oral, resp. rate 16, height 5\' 2"  (1.575 m), weight 100.2 kg, last menstrual period 11/06/2021, SpO2 96 %. Incision/Wound: incision dry intact soft abdomen JPs serosanguinous  Disposition: Discharge disposition: 01-Home or Self Care       Discharge Instructions     Call MD for:  redness, tenderness, or signs of infection (pain, swelling, bleeding, redness, odor or green/yellow discharge around incision site)   Complete by: As directed    Call MD for:  temperature >100.5   Complete by: As directed    Discharge instructions   Complete by: As directed    Ok to remove dressings and shower am 2.13.23. Soap and water ok, pat incisions dry. No creams or ointments over incisions. Do not let drains dangle in shower, attach to lanyard or similar.Strip and record drains twice daily and bring log to clinic visit.  Abdominal binder or compression garment all other times.  No house yard work or exercise until  cleared by MD. Patient received all Rx preop. Recommend sleep with head elevated and pillow beneath knees through follow up visit.   Driving Restrictions   Complete by: As directed    No driving through follow up visit   Lifting restrictions   Complete by: As directed    No lifting > 5-10 lbs until cleared by MD   Resume previous diet   Complete by: As directed       Allergies as of 12/10/2021       Reactions   Food Shortness Of Breath, Swelling, Other (See Comments)   RAW/FRESH FRUIT--PEACHES, PEARS, APPLES=THROAT CLOSES/SWELLING & COUGHING SHORTNESS OF BREATH   Latex Itching   Peach [prunus Persica] Anaphylaxis   Shrimp [shellfish Allergy] Anaphylaxis   Plasticized Base [plastibase] Itching   Tape Rash        Medication List     STOP taking these medications    nystatin cream Commonly known as: MYCOSTATIN   nystatin powder Commonly known as: MYCOSTATIN/NYSTOP       TAKE these medications    acetaminophen 500 MG tablet Commonly known as: TYLENOL Take 500-1,000 mg by mouth every 6 (six) hours as needed for mild pain or headache.   clindamycin 1 % gel Commonly known as: CLINDAGEL Apply 1 application topically daily as needed (redness of face).   FLUoxetine 40 MG capsule Commonly known as: PROZAC Take 40 mg by mouth daily.   fluticasone 50 MCG/ACT nasal spray Commonly known as: FLONASE Place 1-2 sprays into both nostrils daily as needed for allergies or rhinitis.   levothyroxine 200 MCG tablet Commonly known as: SYNTHROID Take 1 tablet (200 mcg total) by mouth daily before breakfast.   olmesartan 5 MG tablet Commonly known as:  BENICAR Take 5 mg by mouth at bedtime.        Follow-up Information     Irene Limbo, MD Follow up in 1 week(s).   Specialty: Plastic Surgery Why: as scheduled Contact information: [address] [place] [address] [phone]                 Signed: Irene Limbo 12/10/2021, 8:51  AM

## 2021-12-10 NOTE — Progress Notes (Signed)
D/c tele and IV. Went over AVS with pt and all questions were answered.  ° °Jeiry Birnbaum S Piera Downs, RN ° °

## 2021-12-11 ENCOUNTER — Encounter (HOSPITAL_BASED_OUTPATIENT_CLINIC_OR_DEPARTMENT_OTHER): Payer: Self-pay | Admitting: Plastic Surgery

## 2021-12-12 NOTE — Anesthesia Postprocedure Evaluation (Signed)
Anesthesia Post Note  Patient: [name]  Procedure(s) Performed: IRRIGATION AND DEBRIDEMENT ABDOMEN EVACUATION HEMATOMA     Patient location during evaluation: PACU Anesthesia Type: General Level of consciousness: awake and alert Pain management: pain level controlled Vital Signs Assessment: post-procedure vital signs reviewed and stable Respiratory status: spontaneous breathing, nonlabored ventilation, respiratory function stable and patient connected to nasal cannula oxygen Cardiovascular status: blood pressure returned to baseline and stable Postop Assessment: no apparent nausea or vomiting Anesthetic complications: no   No notable events documented.  Last Vitals:  Vitals:   12/10/21 0434 12/10/21 0729  BP: (!) 153/65 125/61  Pulse: 76 60  Resp: 17 16  Temp: 36.6 C 36.9 C  SpO2: 99% 96%    Last Pain:  Vitals:   12/10/21 0729  TempSrc: Oral  PainSc: 0-No pain                 Ura Hausen S

## 2021-12-13 LAB — TYPE AND SCREEN
ABO/RH(D): O POS
Antibody Screen: NEGATIVE
Unit division: 0
Unit division: 0
Unit division: 0
Unit division: 0
Unit division: 0
Unit division: 0
Unit division: 0
Unit division: 0
Unit division: 0
Unit division: 0

## 2021-12-13 LAB — BPAM RBC
Blood Product Expiration Date: 202303072359
Blood Product Expiration Date: 202303072359
Blood Product Expiration Date: 202303122359
Blood Product Expiration Date: 202303122359
Blood Product Expiration Date: 202303132359
Blood Product Expiration Date: 202303132359
Blood Product Expiration Date: 202303182359
Blood Product Expiration Date: 202303182359
Blood Product Expiration Date: 202303182359
Blood Product Expiration Date: 202303192359
ISSUE DATE / TIME: 202302111046
ISSUE DATE / TIME: 202302111046
ISSUE DATE / TIME: 202302111110
ISSUE DATE / TIME: 202302111110
ISSUE DATE / TIME: 202302111110
ISSUE DATE / TIME: 202302111110
ISSUE DATE / TIME: 202302111223
Unit Type and Rh: 5100
Unit Type and Rh: 5100
Unit Type and Rh: 5100
Unit Type and Rh: 5100
Unit Type and Rh: 5100
Unit Type and Rh: 5100
Unit Type and Rh: 5100
Unit Type and Rh: 5100
Unit Type and Rh: 9500
Unit Type and Rh: 9500

## 2021-12-20 ENCOUNTER — Inpatient Hospital Stay: Payer: Medicaid Other | Attending: Hematology and Oncology

## 2021-12-20 ENCOUNTER — Other Ambulatory Visit: Payer: Self-pay

## 2021-12-20 ENCOUNTER — Inpatient Hospital Stay: Payer: Medicaid Other | Admitting: Hematology and Oncology

## 2021-12-20 VITALS — BP 140/78 | HR 72 | Temp 98.7°F | Resp 18 | Wt 206.3 lb

## 2021-12-20 DIAGNOSIS — Z8042 Family history of malignant neoplasm of prostate: Secondary | ICD-10-CM | POA: Insufficient documentation

## 2021-12-20 DIAGNOSIS — D500 Iron deficiency anemia secondary to blood loss (chronic): Secondary | ICD-10-CM | POA: Insufficient documentation

## 2021-12-20 DIAGNOSIS — Z803 Family history of malignant neoplasm of breast: Secondary | ICD-10-CM | POA: Diagnosis not present

## 2021-12-20 DIAGNOSIS — I10 Essential (primary) hypertension: Secondary | ICD-10-CM | POA: Diagnosis not present

## 2021-12-20 LAB — CMP (CANCER CENTER ONLY)
ALT: 9 U/L (ref 0–44)
AST: 13 U/L — ABNORMAL LOW (ref 15–41)
Albumin: 3.3 g/dL — ABNORMAL LOW (ref 3.5–5.0)
Alkaline Phosphatase: 80 U/L (ref 38–126)
Anion gap: 8 (ref 5–15)
BUN: 13 mg/dL (ref 6–20)
CO2: 25 mmol/L (ref 22–32)
Calcium: 8.1 mg/dL — ABNORMAL LOW (ref 8.9–10.3)
Chloride: 103 mmol/L (ref 98–111)
Creatinine: 0.82 mg/dL (ref 0.44–1.00)
GFR, Estimated: >60 mL/min (ref >60)
Glucose, Bld: 133 mg/dL — ABNORMAL HIGH (ref 70–99)
Potassium: 3.6 mmol/L (ref 3.5–5.1)
Sodium: 136 mmol/L (ref 135–145)
Total Bilirubin: 0.1 mg/dL — ABNORMAL LOW (ref 0.3–1.2)
Total Protein: 6.9 g/dL (ref 6.5–8.1)

## 2021-12-20 LAB — CBC WITH DIFFERENTIAL (CANCER CENTER ONLY)
Abs Immature Granulocytes: 0.06 10*3/uL (ref 0.00–0.07)
Basophils Absolute: 0.1 10*3/uL (ref 0.0–0.1)
Basophils Relative: 1 %
Eosinophils Absolute: 0.2 10*3/uL (ref 0.0–0.5)
Eosinophils Relative: 3 %
HCT: 31 % — ABNORMAL LOW (ref 36.0–46.0)
Hemoglobin: 9.7 g/dL — ABNORMAL LOW (ref 12.0–15.0)
Immature Granulocytes: 1 %
Lymphocytes Relative: 22 %
Lymphs Abs: 1.6 10*3/uL (ref 0.7–4.0)
MCH: 27.6 pg (ref 26.0–34.0)
MCHC: 31.3 g/dL (ref 30.0–36.0)
MCV: 88.3 fL (ref 80.0–100.0)
Monocytes Absolute: 0.8 10*3/uL (ref 0.1–1.0)
Monocytes Relative: 11 %
Neutro Abs: 4.5 10*3/uL (ref 1.7–7.7)
Neutrophils Relative %: 62 %
Platelet Count: 449 10*3/uL — ABNORMAL HIGH (ref 150–400)
RBC: 3.51 MIL/uL — ABNORMAL LOW (ref 3.87–5.11)
RDW: 15.7 % — ABNORMAL HIGH (ref 11.5–15.5)
WBC Count: 7.3 10*3/uL (ref 4.0–10.5)
nRBC: 0 % (ref 0.0–0.2)

## 2021-12-20 LAB — RETIC PANEL
Immature Retic Fract: 22.5 % — ABNORMAL HIGH (ref 2.3–15.9)
RBC.: 3.49 MIL/uL — ABNORMAL LOW (ref 3.87–5.11)
Retic Count, Absolute: 99.5 10*3/uL (ref 19.0–186.0)
Retic Ct Pct: 2.9 % (ref 0.4–3.1)
Reticulocyte Hemoglobin: 25.6 pg — ABNORMAL LOW (ref >27.9)

## 2021-12-20 LAB — IRON AND IRON BINDING CAPACITY (CC-WL,HP ONLY)
Iron: 19 ug/dL — ABNORMAL LOW (ref 28–170)
Saturation Ratios: 5 % — ABNORMAL LOW (ref 10.4–31.8)
TIBC: 378 ug/dL (ref 250–450)
UIBC: 359 ug/dL
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
## 2021-12-21 LAB — FERRITIN: Ferritin: 39 ng/mL (ref 11–307)

## 2022-01-04 ENCOUNTER — Other Ambulatory Visit: Payer: Self-pay | Admitting: Hematology and Oncology

## 2022-01-04 ENCOUNTER — Telehealth: Payer: Self-pay | Admitting: *Deleted

## 2022-01-04 NOTE — Telephone Encounter (Signed)
-----   Message from Orson Slick, MD sent at 01/04/2022 12:51 PM EST ----- ?Please let [name] know that her iron levels remain low. She will need another round of IV iron. The request has been placed to [facility]. We will see her back 4-6 weeks after her last dose of IV iron.  ? ?----- Message ----- ?From: Interface, Lab In [place] ?Sent: 12/20/2021   2:52 PM EST ?To: Orson Slick, MD ? ? ?

## 2022-01-04 NOTE — Telephone Encounter (Signed)
TCT patient regarding recent lab results.  Spoke with Taffie and advised that she needs another round of IV iron. Advised that this will be done @ the [facility]. Advised that Dr. Lorenso Courier sent that order today. And pt should expect a call back from the facility in the next few days.  Pt states she is having surgery on 02/13/22. She should have time to get all 5 iron infusions completed by then. Pt voiced understanding. ?

## 2022-01-10 ENCOUNTER — Telehealth: Payer: Self-pay | Admitting: Pharmacy Technician

## 2022-01-10 ENCOUNTER — Other Ambulatory Visit: Payer: Self-pay | Admitting: Physician Assistant

## 2022-01-10 NOTE — Telephone Encounter (Signed)
Dr. Lorenso Courier ?Fyi note: ?Auth Submission: NO AUTH NEEDED ?Payer: [place] ?Medication & CPT/J Code(s) submitted: Venofer (Iron Sucrose) J1756 ?Route of submission (phone, fax, portal): PHONE [phone] ?Auth type: Buy/Bill ?Units/visits requested: 5 VISITS ?Reference number: [id number] ?Approval from: 01/10/22 to 03/12/22  ?

## 2022-01-11 ENCOUNTER — Telehealth: Payer: Self-pay

## 2022-01-11 ENCOUNTER — Other Ambulatory Visit: Payer: Self-pay

## 2022-01-11 NOTE — Telephone Encounter (Signed)
Spoke with patient regarding Venofer appointments. All have been scheduled. Patient requesting Lab appointment for CBC needed prior to surgery. Scheduling message sent. ?

## 2022-01-15 ENCOUNTER — Other Ambulatory Visit: Payer: Self-pay

## 2022-01-15 ENCOUNTER — Ambulatory Visit (INDEPENDENT_AMBULATORY_CARE_PROVIDER_SITE_OTHER): Payer: Medicaid Other

## 2022-01-15 VITALS — BP 172/103 | HR 59 | Temp 98.3°F | Resp 16 | Ht 62.0 in | Wt 217.6 lb

## 2022-01-15 DIAGNOSIS — D508 Other iron deficiency anemias: Secondary | ICD-10-CM | POA: Diagnosis not present

## 2022-01-15 MED ORDER — SODIUM CHLORIDE 0.9 % IV SOLN
200.0000 mg | Freq: Once | INTRAVENOUS | Status: AC
  Administered 2022-01-15: 200 mg via INTRAVENOUS
  Filled 2022-01-15: qty 10

## 2022-01-15 NOTE — Progress Notes (Signed)
Diagnosis: Iron Deficiency Anemia ? ?Provider:  Marshell Garfinkel, MD ? ?Procedure: Infusion ? ?IV Type: Peripheral, IV Location: L Antecubital ? ?Venofer (Iron Sucrose), Dose: 200 mg ? ?Infusion Start Time: [phone] ? ?Infusion Stop Time: 0910 ? ?Post Infusion IV Care: Patient declined observation and Peripheral IV Discontinued ? ?Discharge: Condition: Good, Destination: Home . AVS declined. ? ?Performed by:  Koren Shiver, RN  ?  ?

## 2022-01-22 ENCOUNTER — Ambulatory Visit: Payer: Medicaid Other

## 2022-01-22 MED ORDER — SODIUM CHLORIDE 0.9 % IV SOLN
200.0000 mg | Freq: Once | INTRAVENOUS | Status: DC
  Filled 2022-01-22: qty 10

## 2022-01-23 ENCOUNTER — Ambulatory Visit (INDEPENDENT_AMBULATORY_CARE_PROVIDER_SITE_OTHER): Payer: Medicaid Other

## 2022-01-23 ENCOUNTER — Other Ambulatory Visit: Payer: Self-pay

## 2022-01-23 VITALS — BP 170/99 | HR 60 | Temp 98.5°F | Resp 18 | Ht 62.0 in | Wt 219.6 lb

## 2022-01-23 DIAGNOSIS — D508 Other iron deficiency anemias: Secondary | ICD-10-CM

## 2022-01-23 MED ORDER — SODIUM CHLORIDE 0.9 % IV SOLN
200.0000 mg | Freq: Once | INTRAVENOUS | Status: AC
  Administered 2022-01-23: 200 mg via INTRAVENOUS
  Filled 2022-01-23: qty 10

## 2022-01-23 NOTE — Progress Notes (Signed)
Diagnosis: Iron Deficiency Anemia ? ?Provider:  Marshell Garfinkel, MD ? ?Procedure: Infusion ? ?IV Type: Peripheral, IV Location: R Antecubital ? ?Venofer (Iron Sucrose), Dose: 200 mg ? ?Infusion Start Time: 08.45 ? ?Infusion Stop Time: 09.05 ? ?Post Infusion IV Care: Peripheral IV Discontinued ? ?Discharge: Condition: Good, Destination: Home . AVS provided to patient.  ? ?Performed by:  Velicia Dejager, RN  ?  ?

## 2022-01-24 ENCOUNTER — Ambulatory Visit: Payer: Medicaid Other

## 2022-01-29 ENCOUNTER — Ambulatory Visit (INDEPENDENT_AMBULATORY_CARE_PROVIDER_SITE_OTHER): Payer: Medicaid Other

## 2022-01-29 VITALS — BP 193/102 | HR 56 | Temp 98.0°F | Resp 20 | Ht 62.0 in | Wt 211.8 lb

## 2022-01-29 DIAGNOSIS — D508 Other iron deficiency anemias: Secondary | ICD-10-CM | POA: Diagnosis not present

## 2022-01-29 MED ORDER — SODIUM CHLORIDE 0.9 % IV SOLN
200.0000 mg | Freq: Once | INTRAVENOUS | Status: AC
  Administered 2022-01-29: 200 mg via INTRAVENOUS
  Filled 2022-01-29: qty 10

## 2022-01-29 NOTE — Progress Notes (Signed)
Diagnosis: Iron Deficiency Anemia ? ?Provider:  Marshell Garfinkel, MD ? ?Procedure: Infusion ? ?IV Type: Peripheral, IV Location: L Antecubital ? ?Venofer (Iron Sucrose), Dose: 200 mg ? ?Infusion Start Time: [phone] ? ?Infusion Stop Time: 0935 am ? ?Post Infusion IV Care: Peripheral IV Discontinued ? ?Discharge: Condition: Good, Destination: Home . AVS provided to patient.  ? ?Performed by:  Koren Shiver, RN  ?  ?

## 2022-02-05 ENCOUNTER — Other Ambulatory Visit: Payer: Self-pay

## 2022-02-05 ENCOUNTER — Inpatient Hospital Stay: Payer: Medicaid Other | Attending: Hematology and Oncology

## 2022-02-05 ENCOUNTER — Ambulatory Visit (INDEPENDENT_AMBULATORY_CARE_PROVIDER_SITE_OTHER): Payer: Medicaid Other

## 2022-02-05 VITALS — BP 167/101 | HR 52 | Temp 98.0°F | Resp 16 | Ht 62.0 in | Wt 214.6 lb

## 2022-02-05 DIAGNOSIS — D508 Other iron deficiency anemias: Secondary | ICD-10-CM | POA: Diagnosis not present

## 2022-02-05 DIAGNOSIS — D500 Iron deficiency anemia secondary to blood loss (chronic): Secondary | ICD-10-CM | POA: Insufficient documentation

## 2022-02-05 LAB — CBC WITH DIFFERENTIAL (CANCER CENTER ONLY)
Abs Immature Granulocytes: 0.03 10*3/uL (ref 0.00–0.07)
Basophils Absolute: 0.1 10*3/uL (ref 0.0–0.1)
Basophils Relative: 1 %
Eosinophils Absolute: 0.3 10*3/uL (ref 0.0–0.5)
Eosinophils Relative: 4 %
HCT: 37.2 % (ref 36.0–46.0)
Hemoglobin: 11.8 g/dL — ABNORMAL LOW (ref 12.0–15.0)
Immature Granulocytes: 1 %
Lymphocytes Relative: 31 %
Lymphs Abs: 1.9 10*3/uL (ref 0.7–4.0)
MCH: 26.4 pg (ref 26.0–34.0)
MCHC: 31.7 g/dL (ref 30.0–36.0)
MCV: 83.2 fL (ref 80.0–100.0)
Monocytes Absolute: 0.4 10*3/uL (ref 0.1–1.0)
Monocytes Relative: 7 %
Neutro Abs: 3.5 10*3/uL (ref 1.7–7.7)
Neutrophils Relative %: 56 %
Platelet Count: 352 10*3/uL (ref 150–400)
RBC: 4.47 MIL/uL (ref 3.87–5.11)
RDW: 16.1 % — ABNORMAL HIGH (ref 11.5–15.5)
WBC Count: 6.2 10*3/uL (ref 4.0–10.5)
nRBC: 0 % (ref 0.0–0.2)

## 2022-02-05 MED ORDER — SODIUM CHLORIDE 0.9 % IV SOLN
200.0000 mg | Freq: Once | INTRAVENOUS | Status: AC
  Administered 2022-02-05: 200 mg via INTRAVENOUS
  Filled 2022-02-05: qty 10

## 2022-02-05 NOTE — Progress Notes (Signed)
Diagnosis: Iron Deficiency Anemia ? ?Provider:  Marshell Garfinkel, MD ? ?Procedure: Infusion ? ?IV Type: Peripheral, IV Location: R Antecubital ? ?Venofer (Iron Sucrose), Dose: 200 mg ? ?Infusion Start Time: 88 ? ?Infusion Stop Time: 0900 ? ?Post Infusion IV Care: Peripheral IV Discontinued ? ?Discharge: Condition: Good, Destination: Home . AVS provided to patient.  ? ?Performed by:  Vaniah Chambers, RN  ?  ?

## 2022-02-12 ENCOUNTER — Ambulatory Visit (INDEPENDENT_AMBULATORY_CARE_PROVIDER_SITE_OTHER): Payer: Medicaid Other

## 2022-02-12 VITALS — BP 151/87 | HR 54 | Temp 98.2°F | Resp 16 | Ht 62.0 in | Wt 218.0 lb

## 2022-02-12 DIAGNOSIS — D509 Iron deficiency anemia, unspecified: Secondary | ICD-10-CM | POA: Diagnosis not present

## 2022-02-12 DIAGNOSIS — D508 Other iron deficiency anemias: Secondary | ICD-10-CM | POA: Diagnosis not present

## 2022-02-12 MED ORDER — SODIUM CHLORIDE 0.9 % IV SOLN
200.0000 mg | Freq: Once | INTRAVENOUS | Status: AC
  Administered 2022-02-12: 200 mg via INTRAVENOUS
  Filled 2022-02-12: qty 10

## 2022-02-12 NOTE — Progress Notes (Signed)
Diagnosis: Iron Deficiency Anemia ? ?Provider:  Marshell Garfinkel, MD ? ?Procedure: Infusion ? ?IV Type: Peripheral, IV Location: R Antecubital ? ?Venofer (Iron Sucrose), Dose: 200 mg ? ?Infusion Start Time: [phone] ? ?Infusion Stop Time: 0939 ? ?Post Infusion IV Care: Peripheral IV Discontinued ? ?Discharge: Condition: Good, Destination: Home . AVS declined. ? ?Performed by:  Koren Shiver, RN  ?  ?

## 2022-03-22 ENCOUNTER — Inpatient Hospital Stay: Payer: Medicaid Other | Attending: Hematology and Oncology

## 2022-03-22 ENCOUNTER — Inpatient Hospital Stay: Payer: Medicaid Other | Admitting: Hematology and Oncology

## 2022-03-22 ENCOUNTER — Other Ambulatory Visit: Payer: Self-pay | Admitting: Hematology and Oncology

## 2022-03-22 DIAGNOSIS — D500 Iron deficiency anemia secondary to blood loss (chronic): Secondary | ICD-10-CM

## 2022-04-05 NOTE — H&P (Signed)
Subjective:     Patient ID: [name] is a 46 y.o. female.     4 months post abdominal panniculectomy. Course complicated by hematoma readmission POD#1 and surgery for evacuation hematoma. Resumed iron infusions, Hb 12.7 5.18.2023. Scheduled for bilateral thigh lipectomy this month.   Reports daily rash and itching beneath thigh soft tissue rolls. This has not been controlled with topical antifungal and hygeine measures. Reports oral antibiotics prescribed for this post partum/C section. Has not required oral antibiotics for over year   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 Highest weight 390 lb. Lowest weight post bypass 190 lb. Had pregnancy following bypass, delivered 02/2019. Wt stable over last 6 months. Total 5105 g soft tissue resection panniculectomy.   OSA and DM resolved with weight loss. HbA1c 03/2022 6.0. HTN improved post weight loss- reports fewer medications.    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    Lives with infant, who has [place]. Not working currently. Has adult daughter and son in area to assist with post op care.   Review of Systems      Objective:   Physical Exam Cardiovascular:     Rate and Rhythm: Normal rate and regular rhythm.     Heart sounds: Normal heart sounds.  Pulmonary:     Effort: Pulmonary effort is normal.     Breath sounds: Normal breath sounds.   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 Recommend we continue to observe this as getting smaller on own.   Plan bilateral thigh  lipectomy. Reviewed incisions from groin crease to above knee over medial thigh. Reviewed risks wound healing problems, edema. Reviewed need for leg elevation and compression stocking for several weeks post operative. Discussed observation surgery, drains.  Reviewed in mirror area of anticipated resection.    Additional risks including but not limited to bleeding hematoma seroma need for additional surgeries damage to adjacent structures blood clots in legs and lungs reviewed.   Drain teaching reviewed. Patient has pain medication at home, did not use much from last surgery.

## 2022-04-06 NOTE — Pre-Procedure Instructions (Signed)
Surgical Instructions    Your procedure is scheduled on Monday, June 19th.  Report to [facility] Main Entrance "A" at 5:30 A.M., then check in with the Admitting office.  Call this number if you have problems the morning of surgery:  [phone]   If you have any questions prior to your surgery date call [phone]: Open Monday-Friday 8am-4pm    Remember:  Do not eat after midnight the night before your surgery  You may drink clear liquids until 4:30 A.M. the morning of your surgery.   Clear liquids allowed are: Water, Non-Citrus Juices (without pulp), Carbonated Beverages, Clear Tea, Black Coffee Only (NO MILK, CREAM OR POWDERED CREAMER of any kind), and Gatorade.    Take these medicines the morning of surgery with A SIP OF WATER  amLODipine (NORVASC)  levothyroxine (SYNTHROID, LEVOTHROID)   As needed: FLUoxetine (PROZAC) fluticasone (FLONASE)  As of today, STOP taking any Aspirin (unless otherwise instructed by your surgeon) Aleve, Naproxen, Ibuprofen, Motrin, Advil, Goody's, BC's, all herbal medications, fish oil, and all vitamins.  HOW TO MANAGE YOUR DIABETES BEFORE AND AFTER SURGERY  Why is it important to control my blood sugar before and after surgery? Improving blood sugar levels before and after surgery helps healing and can limit problems. A way of improving blood sugar control is eating a healthy diet by:  Eating less sugar and carbohydrates  Increasing activity/exercise  Talking with your doctor about reaching your blood sugar goals High blood sugars (greater than 180 mg/dL) can raise your risk of infections and slow your recovery, so you will need to focus on controlling your diabetes during the weeks before surgery. Make sure that the doctor who takes care of your diabetes knows about your planned surgery including the date and location.  How do I manage my blood sugar before surgery? Check your blood sugar at least 4 times a day, starting 2 days before  surgery, to make sure that the level is not too high or low.  Check your blood sugar the morning of your surgery when you wake up and every 2 hours until you get to the Short Stay unit.  If your blood sugar is less than 70 mg/dL, you will need to treat for low blood sugar: Do not take insulin. Treat a low blood sugar (less than 70 mg/dL) with  cup of clear juice (cranberry or apple), 4 glucose tablets, OR glucose gel. Recheck blood sugar in 15 minutes after treatment (to make sure it is greater than 70 mg/dL). If your blood sugar is not greater than 70 mg/dL on recheck, call [phone] for further instructions. Report your blood sugar to the short stay nurse when you get to Short Stay.  If you are admitted to the hospital after surgery: Your blood sugar will be checked by the staff and you will probably be given insulin after surgery (instead of oral diabetes medicines) to make sure you have good blood sugar levels. The goal for blood sugar control after surgery is 80-180 mg/dL.                      Do NOT Smoke (Tobacco/Vaping) for 24 hours prior to your procedure.  If you use a CPAP at night, you may bring your mask/headgear for your overnight stay.   Contacts, glasses, piercing's, hearing aid's, dentures or partials may not be worn into surgery, please bring cases for these belongings.    For patients admitted to the hospital, discharge time will be  determined by your treatment team.   Patients discharged the day of surgery will not be allowed to drive home, and someone needs to stay with them for 24 hours.  SURGICAL WAITING ROOM VISITATION Patients having surgery or a procedure may have two support people in the waiting room. These visitors may be switched out with other visitors if needed. Children under the age of 33 must have an adult accompany them who is not the patient. If the patient needs to stay at the hospital during part of their recovery, the visitor guidelines for  inpatient rooms apply.  Please refer to the [facility] website for the visitor guidelines for Inpatients (after your surgery is over and you are in a regular room).    Special instructions:   [place]- Preparing For Surgery  Before surgery, you can play an important role. Because skin is not sterile, your skin needs to be as free of germs as possible. You can reduce the number of germs on your skin by washing with CHG (chlorahexidine gluconate) Soap before surgery.  CHG is an antiseptic cleaner which kills germs and bonds with the skin to continue killing germs even after washing.    Oral Hygiene is also important to reduce your risk of infection.  Remember - BRUSH YOUR TEETH THE MORNING OF SURGERY WITH YOUR REGULAR TOOTHPASTE  Please do not use if you have an allergy to CHG or antibacterial soaps. If your skin becomes reddened/irritated stop using the CHG.  Do not shave (including legs and underarms) for at least 48 hours prior to first CHG shower. It is OK to shave your face.  Please follow these instructions carefully.   Shower the NIGHT BEFORE SURGERY and the MORNING OF SURGERY  If you chose to wash your hair, wash your hair first as usual with your normal shampoo.  After you shampoo, rinse your hair and body thoroughly to remove the shampoo.  Use CHG Soap as you would any other liquid soap. You can apply CHG directly to the skin and wash gently with a scrungie or a clean washcloth.   Apply the CHG Soap to your body ONLY FROM THE NECK DOWN.  Do not use on open wounds or open sores. Avoid contact with your eyes, ears, mouth and genitals (private parts). Wash Face and genitals (private parts)  with your normal soap.   Wash thoroughly, paying special attention to the area where your surgery will be performed.  Thoroughly rinse your body with warm water from the neck down.  DO NOT shower/wash with your normal soap after using and rinsing off the CHG Soap.  Pat yourself dry with a  CLEAN TOWEL.  Wear CLEAN PAJAMAS to bed the night before surgery  Place CLEAN SHEETS on your bed the night before your surgery  DO NOT SLEEP WITH PETS.   Day of Surgery: Take a shower with CHG soap. Do not wear jewelry Do not wear lotions, powders, perfumes, or deodorant. Do not shave 48 hours prior to surgery.  Do not bring valuables to the hospital.  [facility] is not responsible for any belongings or valuables. Do not wear nail polish, gel polish, artificial nails, or any other type of covering on natural nails (fingers and toes) If you have artificial nails or gel coating that need to be removed by a nail salon, please have this removed prior to surgery. Artificial nails or gel coating may interfere with anesthesia's ability to adequately monitor your vital signs. Wear Clean/Comfortable clothing the  morning of surgery Remember to brush your teeth WITH YOUR REGULAR TOOTHPASTE.   Please read over the following fact sheets that you were given.    If you received a COVID test during your pre-op visit  it is requested that you wear a mask when out in public, stay away from anyone that may not be feeling well and notify your surgeon if you develop symptoms. If you have been in contact with anyone that has tested positive in the last 10 days please notify you surgeon.

## 2022-04-09 ENCOUNTER — Encounter (HOSPITAL_COMMUNITY)
Admission: RE | Admit: 2022-04-09 | Discharge: 2022-04-09 | Disposition: A | Discharge status: Home / Self Care | Payer: Medicaid Other | Source: Ambulatory Visit | Attending: Plastic Surgery | Admitting: Plastic Surgery

## 2022-04-09 ENCOUNTER — Other Ambulatory Visit: Payer: Self-pay

## 2022-04-09 ENCOUNTER — Encounter (HOSPITAL_COMMUNITY): Payer: Self-pay

## 2022-04-09 VITALS — BP 188/96 | HR 63 | Temp 98.2°F | Resp 18 | Ht 62.0 in | Wt 213.5 lb

## 2022-04-09 DIAGNOSIS — E039 Hypothyroidism, unspecified: Secondary | ICD-10-CM | POA: Insufficient documentation

## 2022-04-09 DIAGNOSIS — K219 Gastro-esophageal reflux disease without esophagitis: Secondary | ICD-10-CM | POA: Diagnosis not present

## 2022-04-09 DIAGNOSIS — Z01812 Encounter for preprocedural laboratory examination: Secondary | ICD-10-CM | POA: Insufficient documentation

## 2022-04-09 DIAGNOSIS — E282 Polycystic ovarian syndrome: Secondary | ICD-10-CM | POA: Insufficient documentation

## 2022-04-09 DIAGNOSIS — G4733 Obstructive sleep apnea (adult) (pediatric): Secondary | ICD-10-CM | POA: Diagnosis not present

## 2022-04-09 DIAGNOSIS — E1136 Type 2 diabetes mellitus with diabetic cataract: Secondary | ICD-10-CM | POA: Diagnosis not present

## 2022-04-09 DIAGNOSIS — I10 Essential (primary) hypertension: Secondary | ICD-10-CM | POA: Diagnosis not present

## 2022-04-09 DIAGNOSIS — Z9884 Bariatric surgery status: Secondary | ICD-10-CM | POA: Insufficient documentation

## 2022-04-09 DIAGNOSIS — D649 Anemia, unspecified: Secondary | ICD-10-CM | POA: Diagnosis not present

## 2022-04-09 DIAGNOSIS — L304 Erythema intertrigo: Secondary | ICD-10-CM | POA: Insufficient documentation

## 2022-04-09 DIAGNOSIS — N920 Excessive and frequent menstruation with regular cycle: Secondary | ICD-10-CM | POA: Diagnosis not present

## 2022-04-09 DIAGNOSIS — Z01818 Encounter for other preprocedural examination: Secondary | ICD-10-CM

## 2022-04-09 HISTORY — DX: Anemia, unspecified: D64.9

## 2022-04-09 LAB — CBC
HCT: 44 % (ref 36.0–46.0)
Hemoglobin: 13.7 g/dL (ref 12.0–15.0)
MCH: 27 pg (ref 26.0–34.0)
MCHC: 31.1 g/dL (ref 30.0–36.0)
MCV: 86.6 fL (ref 80.0–100.0)
Platelets: 397 10*3/uL (ref 150–400)
RBC: 5.08 MIL/uL (ref 3.87–5.11)
RDW: 16.6 % — ABNORMAL HIGH (ref 11.5–15.5)
WBC: 6.7 10*3/uL (ref 4.0–10.5)
nRBC: 0 % (ref 0.0–0.2)

## 2022-04-09 LAB — BASIC METABOLIC PANEL
Anion gap: 7 (ref 5–15)
BUN: 12 mg/dL (ref 6–20)
CO2: 27 mmol/L (ref 22–32)
Calcium: 8.9 mg/dL (ref 8.9–10.3)
Chloride: 105 mmol/L (ref 98–111)
Creatinine, Ser: 0.66 mg/dL (ref 0.44–1.00)
GFR, Estimated: >60 mL/min (ref >60)
Glucose, Bld: 102 mg/dL — ABNORMAL HIGH (ref 70–99)
Potassium: 3.7 mmol/L (ref 3.5–5.1)
Sodium: 139 mmol/L (ref 135–145)

## 2022-04-09 LAB — GLUCOSE, CAPILLARY: Glucose-Capillary: 99 mg/dL (ref 70–99)

## 2022-04-09 NOTE — Progress Notes (Signed)
PCP - Dr. Chaya Jan (prescribes all BP meds) Cardiologist - denies  PPM/ICD - n/a Device Orders - n/a  Rep Notified - n/a  Chest x-ray - 12/09/2021 EKG - 12/09/2021 Stress Test - denies ECHO - denies Cardiac Cath - denies  Sleep Study - Last one 04/11/2015. She had Gastric Bypass surgery in 2018 and has not used her CPAP since then. States that she "does not need it anymore"  Fasting Blood Sugar -  Checks Blood Sugar once per week. CBG usually ranges 80-99. She had an issue with hypoglycemia with CBGs in the 60s. MD told her to eat more and that she will need to go on medication for DM if CBGs start getting above 150s  Blood Thinner Instructions: n/a Aspirin Instructions: n/a  ERAS Protcol - Yes. Pt can have clear liquids until 0430 the morning of surgery PRE-SURGERY Ensure or G2- n/a. No drink ordred  COVID TEST- n/a   Anesthesia review: Yes. EKG review from February 2023. Pt was hypertensive at PAT visit, 188/96. Rechecked at end of visit, 165/101. Pt did not take her BP meds prior to visit because she usually takes them at 1000 every morning (PAT visit scheduled for 0900). Usual BP range for patient 160-190s. Educated on measuring BP frequently prior to surgery and making sure to take Norvasc on day of surgery prior to arrival.  Patient denies shortness of breath, fever, cough and chest pain at PAT appointment   All instructions explained to the patient, with a verbal understanding of the material. Patient agrees to go over the instructions while at home for a better understanding. Patient also instructed to self quarantine after being tested for COVID-19. The opportunity to ask questions was provided.

## 2022-04-10 NOTE — Anesthesia Preprocedure Evaluation (Addendum)
Anesthesia Evaluation  Patient identified by MRN, date of birth, ID band Patient awake  General Assessment Comment:Intertrigo, h/o bariatric surgery  Reviewed: Allergy & Precautions, NPO status , Patient's Chart, lab work & pertinent test results  Airway Mallampati: II  TM Distance: >3 FB Neck ROM: Full    Dental  (+) Teeth Intact, Dental Advisory Given   Pulmonary sleep apnea ,    Pulmonary exam normal breath sounds clear to auscultation       Cardiovascular hypertension, Pt. on medications Normal cardiovascular exam Rhythm:Regular Rate:Normal     Neuro/Psych PSYCHIATRIC DISORDERS Anxiety Depression negative neurological ROS     GI/Hepatic Neg liver ROS, GERD  ,  Endo/Other  diabetes, Well Controlled, Type 2Hypothyroidism Morbid obesity  Renal/GU negative Renal ROS     Musculoskeletal negative musculoskeletal ROS (+)   Abdominal   Peds  Hematology negative hematology ROS (+)   Anesthesia Other Findings   Reproductive/Obstetrics                           Anesthesia Physical Anesthesia Plan  ASA: 3  Anesthesia Plan: General   Post-op Pain Management: Tylenol PO (pre-op)*   Induction: Intravenous  PONV Risk Score and Plan: 3 and Midazolam, Dexamethasone and Ondansetron  Airway Management Planned: Oral ETT  Additional Equipment:   Intra-op Plan:   Post-operative Plan: Extubation in OR  Informed Consent: I have reviewed the patients History and Physical, chart, labs and discussed the procedure including the risks, benefits and alternatives for the proposed anesthesia with the patient or authorized representative who has indicated his/her understanding and acceptance.     Dental advisory given  Plan Discussed with: CRNA  Anesthesia Plan Comments: (PAT note written 04/10/2022 by Myra Gianotti, PA-C. )      Anesthesia Quick Evaluation

## 2022-04-10 NOTE — Progress Notes (Signed)
Anesthesia Chart Review:  Case: [id number] Date/Time: 04/16/22 0700   Procedure: LIPECTOMY (Bilateral: Thigh)   Anesthesia type: General   Pre-op diagnosis: intertrigo, history bariatric surgery   Location: [facility] OR ROOM 09 / [place] OR   Surgeons: Irene Limbo, MD       DISCUSSION: Patient is a 46 year old female scheduled for the above procedure.  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  S/p panniculectomy on 12/08/21. She was observed overnight for hypotension, monitor JP drain output. She had syncope after discharge and found to be hypotension despite BP medications being held. She had increased JP output and HGB down to 6.1. s/p 2 units PRBC + 1 intraop. She was taken back to the OR on 12/09/21 for exploration and hematoma evacuated and cavity irrigated. General tissue oozing noted without arterial bleeding. Arista placed and wound closed with suture and Dermabond. Hypotension resolved. She was discharged home 12/10/21 with JP drains.   She saw hematologist Dr. Lorenso Courier in follow-up on 12/20/21 for iron deficiency anemia secondary to GI bleeding follow-up. He also reviewed events with panniculectomy surgeries. No further excessive JP drainage, no nosebleeds, gum bleeding, or dark stools. HGB 9.7. Iron studies repeated and IV iron ordered.  PAT BP 188/96 and 165/101. She reported forgetting to take her usual antihypertensive medication prior to her visit, but home readings typically ~ 160/90s. She is on amlodipine 5 mg daily, lisinopril-HCTZ 20-25 mg daily which she should continue, except hold lisinopril-HCTZ on the morning of surgery.  BP 166/81 on 04/05/2022 at visit with Dr. Iran Planas and 155/87 on 03/15/2022 with Dr. Tamala Julian.  Anesthesia team to evaluate on the day of surgery.  She will get VS on arrival.  She also needs a urine pregnancy test.  H&H 13.7/44.0 on 04/09/2022.   VS: BP (!) 188/96   Pulse 63   Temp 36.8 C (Oral)   Resp 18   Ht '5\' 2"'$  (1.575 m)   Wt 96.8 kg   SpO2 100%   BMI 39.05 kg/m  BP Readings from Last 3 Encounters:  04/09/22 (!) 188/96  02/12/22 (!) 151/87  02/05/22 (!) 167/101     PROVIDERS: Wardell Honour, MD is PCP  Sheran Spine, MD is HEM   LABS: Labs reviewed: Acceptable for surgery. (all labs ordered are listed, but only abnormal results are displayed)  Labs Reviewed  BASIC METABOLIC PANEL - Abnormal; Notable for the following components:      Result Value   Glucose, Bld 102 (*)    All other components within normal limits  CBC - Abnormal; Notable for the following components:   RDW 16.6 (*)    All other components within normal limits  GLUCOSE, CAPILLARY  A1c 6.0% 03/15/2022 ([facility]).   IMAGES: 1V PCXR 12/09/21: FINDINGS: - Cardiac silhouette is normal in size. Normal mediastinal and hilar contours. Lung volumes are low. Lungs are clear. - No convincing pleural effusion or pneumothorax. - Skeletal structures are grossly intact. IMPRESSION: No active disease.   EKG: 12/09/21: Sinus rhythm Abnormal R-wave progression, early transition Borderline T wave abnormalities Confirmed by Sherwood Gambler [phone]) on 12/09/2021 10:38:59 AM   CV: N/A  Past Medical History:  Diagnosis Date   Anemia    Carpal tunnel syndrome, bilateral    Chronic hypertension    Depression    GERD (gastroesophageal reflux disease)  Gestational diabetes    H. pylori infection    dx  09-02-2014-- treated w/ antibiotic   Hypertension    recently low pressures    Hypothyroid    Irregular menstrual cycle    Menorrhagia    PCOS (polycystic ovarian syndrome)    Type 2 diabetes mellitus ([place])     Past Surgical History:  Procedure Laterality Date   CARPAL TUNNEL RELEASE Bilateral 10/07/2014   Procedure: CARPAL TUNNEL RELEASE  BILATERAL;  Surgeon: [name], MD;  Location: [place];  Service: Orthopedics;  Laterality: Bilateral;   CESAREAN SECTION  10/29/1993   CESAREAN SECTION WITH BILATERAL TUBAL LIGATION N/A 03/27/2019   Procedure: CESAREAN SECTION WITH BILATERAL TUBAL LIGATION;  Surgeon: Bobbye Charleston, MD;  Location: [place] [facility];  Service: Obstetrics;  Laterality: N/A;   DILITATION & CURRETTAGE/HYSTROSCOPY WITH NOVASURE ABLATION N/A 11/08/2021   Procedure: DILATATION & CURETTAGE/HYSTEROSCOPY WITH NOVASURE ABLATION;  Surgeon: Bobbye Charleston, MD;  Location: [place];  Service: Gynecology;  Laterality: N/A;   EYE SURGERY  09/13/2020   cataract bilateral   GASTRIC ROUX-EN-Y N/A 03/12/2017   Procedure: LAPAROSCOPIC ROUX-EN-Y GASTRIC BYPASS WITH UPPER ENDOSCOPY;  Surgeon: Kieth Brightly, Arta Bruce, MD;  Location: [facility];  Service: General;  Laterality: N/A;   HEMATOMA EVACUATION  12/09/2021   Procedure: EVACUATION HEMATOMA;  Surgeon: Irene Limbo, MD;  Location: [place];  Service: Plastics;;   I & D EXTREMITY N/A 12/09/2021   Procedure: IRRIGATION AND DEBRIDEMENT ABDOMEN;  Surgeon: Irene Limbo, MD;  Location: [place];  Service: Plastics;  Laterality: N/A;   PANNICULECTOMY N/A 12/08/2021   Procedure: PANNICULECTOMY;  Surgeon: Irene Limbo, MD;  Location: [place];  Service: Plastics;  Laterality: N/A;   TUBAL LIGATION  03/27/2019   UPPER GI ENDOSCOPY  03/12/2017   Procedure: UPPER GI ENDOSCOPY;  Surgeon: Kieth Brightly Arta Bruce, MD;  Location: [facility];  Service: General;;    MEDICATIONS:  amLODipine (NORVASC) 5 MG tablet   clindamycin (CLINDAGEL) 1 % gel   FLUoxetine (PROZAC) 40 MG capsule   fluticasone (FLONASE) 50 MCG/ACT nasal spray   levothyroxine (SYNTHROID, LEVOTHROID) 200 MCG tablet   lisinopril-hydrochlorothiazide (ZESTORETIC) 20-25 MG tablet   Multiple Vitamins-Minerals (BARIATRIC FUSION PO)   No current facility-administered  medications for this encounter.    Myra Gianotti, PA-C Surgical Short Stay/Anesthesiology [facility] Phone [phone] [facility] Phone [phone] 04/10/2022 3:58 PM

## 2022-04-16 ENCOUNTER — Observation Stay (HOSPITAL_COMMUNITY)
Admission: RE | Admit: 2022-04-16 | Discharge: 2022-04-17 | Disposition: A | Discharge status: Home / Self Care | Payer: Medicaid Other | Source: Ambulatory Visit | Attending: Plastic Surgery | Admitting: Plastic Surgery

## 2022-04-16 ENCOUNTER — Other Ambulatory Visit: Payer: Self-pay

## 2022-04-16 ENCOUNTER — Encounter (HOSPITAL_COMMUNITY): Payer: Self-pay | Admitting: Plastic Surgery

## 2022-04-16 ENCOUNTER — Encounter (HOSPITAL_COMMUNITY)
Admission: RE | Disposition: A | Discharge status: Home / Self Care | Payer: Self-pay | Source: Ambulatory Visit | Attending: Plastic Surgery

## 2022-04-16 ENCOUNTER — Ambulatory Visit (HOSPITAL_COMMUNITY): Payer: Medicaid Other | Admitting: Vascular Surgery

## 2022-04-16 ENCOUNTER — Ambulatory Visit (HOSPITAL_BASED_OUTPATIENT_CLINIC_OR_DEPARTMENT_OTHER): Payer: Medicaid Other | Admitting: Certified Registered Nurse Anesthetist

## 2022-04-16 DIAGNOSIS — E119 Type 2 diabetes mellitus without complications: Secondary | ICD-10-CM | POA: Diagnosis not present

## 2022-04-16 DIAGNOSIS — Z91040 Latex allergy status: Secondary | ICD-10-CM | POA: Diagnosis not present

## 2022-04-16 DIAGNOSIS — Z9884 Bariatric surgery status: Secondary | ICD-10-CM | POA: Diagnosis not present

## 2022-04-16 DIAGNOSIS — I10 Essential (primary) hypertension: Secondary | ICD-10-CM

## 2022-04-16 DIAGNOSIS — E039 Hypothyroidism, unspecified: Secondary | ICD-10-CM

## 2022-04-16 DIAGNOSIS — L304 Erythema intertrigo: Secondary | ICD-10-CM

## 2022-04-16 HISTORY — PX: LIPECTOMY: SHX11

## 2022-04-16 LAB — GLUCOSE, CAPILLARY
Glucose-Capillary: 113 mg/dL — ABNORMAL HIGH (ref 70–99)
Glucose-Capillary: 199 mg/dL — ABNORMAL HIGH (ref 70–99)
Glucose-Capillary: 212 mg/dL — ABNORMAL HIGH (ref 70–99)
Glucose-Capillary: 283 mg/dL — ABNORMAL HIGH (ref 70–99)
Glucose-Capillary: 256 mg/dL — ABNORMAL HIGH (ref 70–99)

## 2022-04-16 LAB — POCT PREGNANCY, URINE: Preg Test, Ur: NEGATIVE

## 2022-04-16 SURGERY — LIPECTOMY
Anesthesia: General | Site: Thigh | Laterality: Bilateral

## 2022-04-16 MED ORDER — FENTANYL CITRATE (PF) 100 MCG/2ML IJ SOLN
INTRAMUSCULAR | Status: AC
  Filled 2022-04-16: qty 2

## 2022-04-16 MED ORDER — HEPARIN SODIUM (PORCINE) 5000 UNIT/ML IJ SOLN
5000.0000 [IU] | Freq: Once | INTRAMUSCULAR | Status: AC
  Administered 2022-04-16: 5000 [IU] via SUBCUTANEOUS
  Filled 2022-04-16: qty 1

## 2022-04-16 MED ORDER — DEXAMETHASONE SODIUM PHOSPHATE 10 MG/ML IJ SOLN
INTRAMUSCULAR | Status: DC | PRN
  Administered 2022-04-16: 8 mg via INTRAVENOUS

## 2022-04-16 MED ORDER — PROPOFOL 10 MG/ML IV BOLUS
INTRAVENOUS | Status: DC | PRN
  Administered 2022-04-16: 140 mg via INTRAVENOUS

## 2022-04-16 MED ORDER — LEVOTHYROXINE SODIUM 100 MCG PO TABS
200.0000 ug | ORAL_TABLET | Freq: Every day | ORAL | Status: DC
Start: 2022-04-17 — End: 2022-04-17
  Administered 2022-04-17: 200 ug via ORAL
  Filled 2022-04-16: qty 2

## 2022-04-16 MED ORDER — ONDANSETRON HCL 4 MG/2ML IJ SOLN: 4.0000 mg | Freq: Once | INTRAMUSCULAR | Status: DC | PRN

## 2022-04-16 MED ORDER — ROCURONIUM BROMIDE 10 MG/ML (PF) SYRINGE
PREFILLED_SYRINGE | INTRAVENOUS | Status: AC
  Filled 2022-04-16: qty 10

## 2022-04-16 MED ORDER — SODIUM BICARBONATE 4.2 % IV SOLN
Freq: Once | INTRAVENOUS | Status: DC
  Filled 2022-04-16: qty 50

## 2022-04-16 MED ORDER — OXYCODONE HCL 5 MG PO TABS
5.0000 mg | ORAL_TABLET | ORAL | Status: DC | PRN
  Administered 2022-04-17: 5 mg via ORAL
  Filled 2022-04-16 (×2): qty 1

## 2022-04-16 MED ORDER — SODIUM CHLORIDE 0.45 % IV SOLN: INTRAVENOUS | Status: DC

## 2022-04-16 MED ORDER — LISINOPRIL-HYDROCHLOROTHIAZIDE 20-25 MG PO TABS: 1.0000 | ORAL_TABLET | Freq: Every morning | ORAL | Status: DC

## 2022-04-16 MED ORDER — HYDROMORPHONE HCL 1 MG/ML IJ SOLN
0.5000 mg | INTRAMUSCULAR | Status: DC | PRN
  Administered 2022-04-16: 0.5 mg via INTRAVENOUS
  Filled 2022-04-16: qty 0.5

## 2022-04-16 MED ORDER — BARIATRIC FUSION PO CHEW: CHEWABLE_TABLET | Freq: Every day | ORAL | Status: DC

## 2022-04-16 MED ORDER — PROPOFOL 10 MG/ML IV BOLUS
INTRAVENOUS | Status: AC
  Filled 2022-04-16: qty 20

## 2022-04-16 MED ORDER — CHLORHEXIDINE GLUCONATE 0.12 % MT SOLN
15.0000 mL | Freq: Once | OROMUCOSAL | Status: AC
  Administered 2022-04-16: 15 mL via OROMUCOSAL
  Filled 2022-04-16: qty 15

## 2022-04-16 MED ORDER — ACETAMINOPHEN 650 MG RE SUPP: 650.0000 mg | Freq: Four times a day (QID) | RECTAL | Status: DC | PRN

## 2022-04-16 MED ORDER — DEXAMETHASONE SODIUM PHOSPHATE 10 MG/ML IJ SOLN
INTRAMUSCULAR | Status: AC
  Filled 2022-04-16: qty 1

## 2022-04-16 MED ORDER — ORAL CARE MOUTH RINSE: 15.0000 mL | Freq: Once | OROMUCOSAL | Status: AC

## 2022-04-16 MED ORDER — SODIUM BICARBONATE 4.2 % IV SOLN
INTRAVENOUS | Status: DC | PRN
  Administered 2022-04-16: 750 mL via INTRAMUSCULAR

## 2022-04-16 MED ORDER — LIDOCAINE 2% (20 MG/ML) 5 ML SYRINGE
INTRAMUSCULAR | Status: DC | PRN
  Administered 2022-04-16: 80 mg via INTRAVENOUS

## 2022-04-16 MED ORDER — ONDANSETRON 4 MG PO TBDP: 4.0000 mg | ORAL_TABLET | Freq: Four times a day (QID) | ORAL | Status: DC | PRN

## 2022-04-16 MED ORDER — PHENYLEPHRINE HCL-NACL 20-0.9 MG/250ML-% IV SOLN
INTRAVENOUS | Status: DC | PRN
  Administered 2022-04-16: 20 ug/min via INTRAVENOUS

## 2022-04-16 MED ORDER — MIDAZOLAM HCL 2 MG/2ML IJ SOLN
INTRAMUSCULAR | Status: AC
Start: 2022-04-16 — End: ?
  Filled 2022-04-16: qty 2

## 2022-04-16 MED ORDER — 0.9 % SODIUM CHLORIDE (POUR BTL) OPTIME
TOPICAL | Status: DC | PRN
  Administered 2022-04-16: 1000 mL

## 2022-04-16 MED ORDER — SUGAMMADEX SODIUM 200 MG/2ML IV SOLN
INTRAVENOUS | Status: DC | PRN
  Administered 2022-04-16: 200 mg via INTRAVENOUS

## 2022-04-16 MED ORDER — ENOXAPARIN SODIUM 40 MG/0.4ML IJ SOSY
40.0000 mg | PREFILLED_SYRINGE | INTRAMUSCULAR | Status: DC
Start: 2022-04-17 — End: 2022-04-17
  Administered 2022-04-17: 40 mg via SUBCUTANEOUS
  Filled 2022-04-16: qty 0.4

## 2022-04-16 MED ORDER — SUCCINYLCHOLINE CHLORIDE 200 MG/10ML IV SOSY
PREFILLED_SYRINGE | INTRAVENOUS | Status: AC
  Filled 2022-04-16: qty 10

## 2022-04-16 MED ORDER — INSULIN ASPART 100 UNIT/ML IJ SOLN
0.0000 [IU] | Freq: Three times a day (TID) | INTRAMUSCULAR | Status: DC
  Administered 2022-04-16: 8 [IU] via SUBCUTANEOUS

## 2022-04-16 MED ORDER — HYDROCHLOROTHIAZIDE 25 MG PO TABS
25.0000 mg | ORAL_TABLET | Freq: Every day | ORAL | Status: DC
  Administered 2022-04-17: 25 mg via ORAL
  Filled 2022-04-16: qty 1

## 2022-04-16 MED ORDER — ACETAMINOPHEN 500 MG PO TABS
1000.0000 mg | ORAL_TABLET | ORAL | Status: AC
  Administered 2022-04-16: 1000 mg via ORAL
  Filled 2022-04-16: qty 2

## 2022-04-16 MED ORDER — CHLORHEXIDINE GLUCONATE CLOTH 2 % EX PADS: 6.0000 | MEDICATED_PAD | Freq: Once | CUTANEOUS | Status: DC

## 2022-04-16 MED ORDER — INSULIN ASPART 100 UNIT/ML IJ SOLN: 0.0000 [IU] | INTRAMUSCULAR | Status: DC | PRN

## 2022-04-16 MED ORDER — LACTATED RINGERS IV SOLN: INTRAVENOUS | Status: DC

## 2022-04-16 MED ORDER — ROCURONIUM BROMIDE 10 MG/ML (PF) SYRINGE
PREFILLED_SYRINGE | INTRAVENOUS | Status: DC | PRN
  Administered 2022-04-16: 60 mg via INTRAVENOUS

## 2022-04-16 MED ORDER — ONDANSETRON HCL 4 MG/2ML IJ SOLN
INTRAMUSCULAR | Status: AC
  Filled 2022-04-16: qty 2

## 2022-04-16 MED ORDER — KCL IN DEXTROSE-NACL 20-5-0.45 MEQ/L-%-% IV SOLN: INTRAVENOUS | Status: DC

## 2022-04-16 MED ORDER — CEFAZOLIN SODIUM-DEXTROSE 2-4 GM/100ML-% IV SOLN
2.0000 g | INTRAVENOUS | Status: AC
  Administered 2022-04-16: 2 g via INTRAVENOUS
  Filled 2022-04-16: qty 100

## 2022-04-16 MED ORDER — ONDANSETRON HCL 4 MG/2ML IJ SOLN
INTRAMUSCULAR | Status: DC | PRN
  Administered 2022-04-16: 4 mg via INTRAVENOUS

## 2022-04-16 MED ORDER — AMLODIPINE BESYLATE 5 MG PO TABS
5.0000 mg | ORAL_TABLET | Freq: Every morning | ORAL | Status: DC
  Administered 2022-04-17: 5 mg via ORAL
  Filled 2022-04-16: qty 1

## 2022-04-16 MED ORDER — DEXMEDETOMIDINE (PRECEDEX) IN NS 20 MCG/5ML (4 MCG/ML) IV SYRINGE
PREFILLED_SYRINGE | INTRAVENOUS | Status: DC | PRN
  Administered 2022-04-16: 20 ug via INTRAVENOUS

## 2022-04-16 MED ORDER — PHENYLEPHRINE 80 MCG/ML (10ML) SYRINGE FOR IV PUSH (FOR BLOOD PRESSURE SUPPORT)
PREFILLED_SYRINGE | INTRAVENOUS | Status: DC | PRN
  Administered 2022-04-16 (×3): 80 ug via INTRAVENOUS

## 2022-04-16 MED ORDER — FENTANYL CITRATE (PF) 100 MCG/2ML IJ SOLN
25.0000 ug | INTRAMUSCULAR | Status: DC | PRN
  Administered 2022-04-16 (×3): 50 ug via INTRAVENOUS

## 2022-04-16 MED ORDER — MIDAZOLAM HCL 2 MG/2ML IJ SOLN
INTRAMUSCULAR | Status: DC | PRN
  Administered 2022-04-16: 2 mg via INTRAVENOUS

## 2022-04-16 MED ORDER — EPHEDRINE SULFATE-NACL 50-0.9 MG/10ML-% IV SOSY
PREFILLED_SYRINGE | INTRAVENOUS | Status: DC | PRN
  Administered 2022-04-16: 5 mg via INTRAVENOUS

## 2022-04-16 MED ORDER — GABAPENTIN 300 MG PO CAPS
300.0000 mg | ORAL_CAPSULE | ORAL | Status: AC
  Administered 2022-04-16: 300 mg via ORAL
  Filled 2022-04-16: qty 1

## 2022-04-16 MED ORDER — FENTANYL CITRATE (PF) 250 MCG/5ML IJ SOLN
INTRAMUSCULAR | Status: DC | PRN
  Administered 2022-04-16 (×5): 50 ug via INTRAVENOUS

## 2022-04-16 MED ORDER — FLUOXETINE HCL 20 MG PO CAPS: 40.0000 mg | ORAL_CAPSULE | Freq: Every day | ORAL | Status: DC | PRN

## 2022-04-16 MED ORDER — ONDANSETRON HCL 4 MG/2ML IJ SOLN: 4.0000 mg | Freq: Four times a day (QID) | INTRAMUSCULAR | Status: DC | PRN

## 2022-04-16 MED ORDER — LIDOCAINE 2% (20 MG/ML) 5 ML SYRINGE
INTRAMUSCULAR | Status: AC
  Filled 2022-04-16: qty 5

## 2022-04-16 MED ORDER — LISINOPRIL 20 MG PO TABS
20.0000 mg | ORAL_TABLET | Freq: Every day | ORAL | Status: DC
  Administered 2022-04-17: 20 mg via ORAL
  Filled 2022-04-16: qty 1

## 2022-04-16 MED ORDER — FENTANYL CITRATE (PF) 250 MCG/5ML IJ SOLN
INTRAMUSCULAR | Status: AC
  Filled 2022-04-16: qty 5

## 2022-04-16 MED ORDER — ACETAMINOPHEN 325 MG PO TABS: 650.0000 mg | ORAL_TABLET | Freq: Four times a day (QID) | ORAL | Status: DC | PRN

## 2022-04-16 SURGICAL SUPPLY — 48 items
ADH SKN CLS APL DERMABOND .7 (GAUZE/BANDAGES/DRESSINGS) ×4
APL PRP STRL LF DISP 70% ISPRP (MISCELLANEOUS) ×3
APPLIER CLIP 9.375 MED OPEN (MISCELLANEOUS) ×2
APR CLP MED 9.3 20 MLT OPN (MISCELLANEOUS) ×1
BNDG CMPR MED 10X6 ELC LF (GAUZE/BANDAGES/DRESSINGS) ×2
BNDG ELASTIC 6X10 VLCR STRL LF (GAUZE/BANDAGES/DRESSINGS) ×4 IMPLANT
BNDG ELASTIC 6X5.8 VLCR STR LF (GAUZE/BANDAGES/DRESSINGS) ×2 IMPLANT
CANISTER SUCT 3000ML PPV (MISCELLANEOUS) ×2 IMPLANT
CATH FOLEY LATEX FREE 16FR (CATHETERS) ×2
CATH FOLEY LF 16FR (CATHETERS) IMPLANT
CHLORAPREP W/TINT 26 (MISCELLANEOUS) ×6 IMPLANT
CLIP APPLIE 9.375 MED OPEN (MISCELLANEOUS) ×1 IMPLANT
DERMABOND ADVANCED (GAUZE/BANDAGES/DRESSINGS) ×4
DERMABOND ADVANCED .7 DNX12 (GAUZE/BANDAGES/DRESSINGS) ×2 IMPLANT
DRAIN CHANNEL 19F RND (DRAIN) ×4 IMPLANT
DRAPE HALF SHEET 40X57 (DRAPES) ×4 IMPLANT
DRAPE IMP U-DRAPE 54X76 (DRAPES) ×2 IMPLANT
DRAPE ORTHO SPLIT 77X108 STRL (DRAPES) ×4
DRAPE ORTHO SPLIT 87X125 STRL (DRAPES) ×4 IMPLANT
DRAPE SURG 17X23 STRL (DRAPES) ×4 IMPLANT
DRAPE SURG ORHT 6 SPLT 77X108 (DRAPES) IMPLANT
DRAPE UTILITY W/TAPE 26X15 (DRAPES) ×4 IMPLANT
ELECT COATED BLADE 2.86 ST (ELECTRODE) ×2 IMPLANT
ELECT REM PT RETURN 9FT ADLT (ELECTROSURGICAL) ×2
ELECTRODE REM PT RTRN 9FT ADLT (ELECTROSURGICAL) ×1 IMPLANT
EVACUATOR SILICONE 100CC (DRAIN) ×4 IMPLANT
FILTER IN LINE W/DETACHED HOSE (FILTER) ×2 IMPLANT
GAUZE 4X4 16PLY ~~LOC~~+RFID DBL (SPONGE) IMPLANT
GAUZE SPONGE 4X4 12PLY STRL (GAUZE/BANDAGES/DRESSINGS) ×2 IMPLANT
GLOVE BIOGEL PI MICRO 6 (GLOVE) ×1
GLOVE BIOGEL PI MICRO STRL 6 (GLOVE) IMPLANT
GOWN STRL REUS W/ TWL LRG LVL3 (GOWN DISPOSABLE) ×1 IMPLANT
GOWN STRL REUS W/TWL LRG LVL3 (GOWN DISPOSABLE) ×2
NDL HYPO 25GX1X1/2 BEV (NEEDLE) ×1 IMPLANT
NEEDLE HYPO 25GX1X1/2 BEV (NEEDLE) ×2 IMPLANT
PACK GENERAL/GYN (CUSTOM PROCEDURE TRAY) ×2 IMPLANT
PAD ABD 8X10 STRL (GAUZE/BANDAGES/DRESSINGS) IMPLANT
PIN SAFETY STERILE (MISCELLANEOUS) ×2 IMPLANT
SOLUTION BETADINE 4OZ (MISCELLANEOUS) ×2 IMPLANT
SPONGE LAP 18X18 X RAY DECT (DISPOSABLE) IMPLANT
STAPLER VISISTAT 35W (STAPLE) ×2 IMPLANT
SUT ETHILON 2 0 FS 18 (SUTURE) ×3 IMPLANT
SUT MNCRL AB 4-0 PS2 18 (SUTURE) ×3 IMPLANT
SUT PDS AB 0 CT1 27 (SUTURE) ×4 IMPLANT
SUT PDS AB 2-0 CT2 27 (SUTURE) ×7 IMPLANT
SUT VLOC 180 0 24IN GS25 (SUTURE) ×4 IMPLANT
SYR CONTROL 10ML LL (SYRINGE) ×2 IMPLANT
TOWEL GREEN STERILE (TOWEL DISPOSABLE) ×2 IMPLANT

## 2022-04-16 NOTE — Interval H&P Note (Signed)
History and Physical Interval Note:  04/16/2022 6:30 AM  [name]  has presented today for surgery, with the diagnosis of intertrigo, history bariatric surgery.  The various methods of treatment have been discussed with the patient and family. After consideration of risks, benefits and other options for treatment, the patient has consented to  Procedure(s): LIPECTOMY (Bilateral) as a surgical intervention.  The patient's history has been reviewed, patient examined, no change in status, stable for surgery.  I have reviewed the patient's chart and labs.  Questions were answered to the patient's satisfaction.     Arnoldo Hooker Rondey Fallen

## 2022-04-16 NOTE — Progress Notes (Signed)
Mobility Specialist Progress Note:   04/16/22 1600  Mobility  Activity Transferred to/from [facility]  Level of Assistance Minimal assist, patient does 75% or more  Assistive Device Other (Comment) (HHA)  Distance Ambulated (ft) 2 ft  Activity Response Tolerated fair  $Mobility charge 1 Mobility   Pt requesting to go to BR to void. Required HHA to get EOB, and to stand. Void successful. Pt c/o of significant pain upon moving, eased as session continued. Pt back in bed with all needs met.  Nelta Numbers Acute Rehab Secure Chat or Office Phone: [phone]

## 2022-04-16 NOTE — Op Note (Signed)
Operative Note   DATE OF OPERATION: 6.19.23  LOCATION:  Main OR-observation  SURGICAL DIVISION: Plastic Surgery  PREOPERATIVE DIAGNOSES:  1. Intertrigo 2. History bariatric surgery  POSTOPERATIVE DIAGNOSES:  same  PROCEDURE:  Bilateral thigh lipectomy  SURGEON: Irene Limbo MD MBA  ASSISTANTCora Collum RNFA  ANESTHESIA:  General.   EBL: 75 ml  COMPLICATIONS: None immediate.   INDICATIONS FOR PROCEDURE:  The patient, [name], is a 46 y.o. female born on [birth year], is here for treatment recurrent intertrigo following massive weight loss of thighs that has failed conservative management.    FINDINGS: Total lipoaspirate 650 ml. Right thigh soft tissue resection 936 g left thigh resection 835 g  DESCRIPTION OF PROCEDURE:  The patient's operative site was marked with the patient in the preoperative area in standing position to mark excision with desired scar at midline thighs extending to just cephalad to medial knee bilateral. Bilateral areas of soft tissue fullness at medial knee marked for liposuction. The soft tissue was displaced posteriorly and anteriorly and anticipated area of resection marked against thigh midline. The patient was taken to the operating room. Foley catheter placed, SCDs placed. SQ heparin administered, and IV antibiotics were given. The patient's operative site was prepped and draped in a sterile fashion. A time out was performed and all information was confirmed to be correct.    Stab incision made in area of planned resection bilateral thigh and tumescent infiltrated, approximately 400 ml tumescent infiltrated in each thigh in area of planned resection and medial knee soft tissue. Suction assisted liposuction performed in marked areas to aid with dissection and closure. Total lipoaspirate approximately 650 ml.    Incision made over left anteromedial thigh and carried through superficial fascia. Dissection proceeded from anterior to posterior thigh  taking care to remain just beneath superficial fascia. The area marked for resection was marked again by palpation through elevated skin flaps and resection completed. Wound irrigated and hemostasis ensured. 51 Fr JP placed percutaneously and secured with 2-0 nylon. Arista applied to wound bed. Closure completed with interrrupted 2-0 and 0 PDS suture in superficial fascia. 0 V lock suture used to approximate dermis and 4-0 monocryl subcuticular suture used for skin closure.   I then directed attention to right thigh. Incision made over anteromedial thigh and carried through superficial fascia. Dissection proceeded from anterior to posterior thigh taking care to remain just beneath superficial fascia. The area marked for resection was checked again by palpation through elevated skin flaps and resection completed. Wound irrigated and hemostasis ensured. 38 Fr JP placed percutaneously and secured with 2-0 nylon. Arista applied to wound bed. Closure completed with interrrupted 2-0 and 0 PDS suture in superficial fascia. 0 V lock suture used to approximate dermis and 4-0 monocryl subcuticular suture used for skin closure. Dermabond applied to all incisions.   TED hose applied. Dry dressing and Ace wraps applied to bilateral thighs.  The patient was allowed to wake from anesthesia, extubated and taken to the recovery room in satisfactory condition.   SPECIMENS: none  DRAINS: 40 Fr JP in right and left thigh  Irene Limbo, MD [facility] Plastic & Reconstructive Surgery  Office/ physician access line after hours [phone]

## 2022-04-16 NOTE — Anesthesia Procedure Notes (Signed)
Procedure Name: Intubation Date/Time: 04/16/2022 7:18 AM  Performed by: Lowella Dell, CRNAPre-anesthesia Checklist: Patient identified, Emergency Drugs available, Suction available and Patient being monitored Patient Re-evaluated:Patient Re-evaluated prior to induction Oxygen Delivery Method: Circle System Utilized Preoxygenation: Pre-oxygenation with 100% oxygen Induction Type: IV induction Ventilation: Mask ventilation without difficulty Laryngoscope Size: Mac and 3 Grade View: Grade I Tube type: Oral Number of attempts: 1 Airway Equipment and Method: Stylet and Oral airway Placement Confirmation: ETT inserted through vocal cords under direct vision, positive ETCO2 and breath sounds checked- equal and bilateral Tube secured with: Tape Dental Injury: Teeth and Oropharynx as per pre-operative assessment

## 2022-04-16 NOTE — Anesthesia Postprocedure Evaluation (Signed)
Anesthesia Post Note  Patient: [name]  Procedure(s) Performed: LIPECTOMY (Bilateral: Thigh)     Patient location during evaluation: PACU Anesthesia Type: General Level of consciousness: awake and alert Pain management: pain level controlled Vital Signs Assessment: post-procedure vital signs reviewed and stable Respiratory status: spontaneous breathing, nonlabored ventilation and respiratory function stable Cardiovascular status: blood pressure returned to baseline and stable Postop Assessment: no apparent nausea or vomiting Anesthetic complications: no   No notable events documented.  Last Vitals:  Vitals:   04/16/22 1150 04/16/22 1205  BP: 136/67 112/67  Pulse: 62 62  Resp: 16 15  Temp:    SpO2: 96% 95%    Last Pain:  Vitals:   04/16/22 1150  TempSrc:   PainSc: [place]

## 2022-04-16 NOTE — Transfer of Care (Signed)
Immediate Anesthesia Transfer of Care Note  Patient: [name]  Procedure(s) Performed: LIPECTOMY (Bilateral: Thigh)  Patient Location: PACU  Anesthesia Type:General  Level of Consciousness: awake and patient cooperative  Airway & Oxygen Therapy: Patient Spontanous Breathing and Patient connected to face mask oxygen  Post-op Assessment: Report given to RN, Post -op Vital signs reviewed and stable and Patient moving all extremities X 4  Post vital signs: Reviewed and stable  Last Vitals:  Vitals Value Taken Time  BP 126/67 04/16/22 1047  Temp    Pulse 75 04/16/22 1048  Resp 19 04/16/22 1048  SpO2 99 % 04/16/22 1048  Vitals shown include unvalidated device data.  Last Pain:  Vitals:   04/16/22 0610  TempSrc:   PainSc: 0-No pain         Complications: No notable events documented.

## 2022-04-17 DIAGNOSIS — L304 Erythema intertrigo: Secondary | ICD-10-CM | POA: Diagnosis not present

## 2022-04-17 LAB — GLUCOSE, CAPILLARY: Glucose-Capillary: 160 mg/dL — ABNORMAL HIGH (ref 70–99)

## 2022-04-17 NOTE — Progress Notes (Signed)
Mobility Specialist Progress Note:   04/17/22 0900  Mobility  Activity Ambulated independently in room  Level of Assistance Modified independent, requires aide device or extra time  Assistive Device Front wheel walker  Distance Ambulated (ft) 50 ft  Activity Response Tolerated well  $Mobility charge 1 Mobility   Pt agreeable to mobility session. Required no physical assist throughout. Pt requesting a RW for home. Back in bed with all needs met.   Nelta Numbers [facility] Secure Chat or Office Phone: [phone]

## 2022-04-17 NOTE — Progress Notes (Signed)
Discharge instructions provided to patient, patient verbalizes understanding. Iv removed, belongings collected. Bandages provided, patient discharged

## 2022-04-17 NOTE — Discharge Summary (Signed)
Physician Discharge Summary  Patient ID: [name] MRN: [id number] DOB/AGE: [birth year] 46 y.o.  Admit date: 04/16/2022 Discharge date: 04/17/2022  Admission Diagnoses: Intertrigo   Discharge Diagnoses:  Principal Problem:   Intertrigo   Discharged Condition: stable  Hospital Course: Post operatively patient tolerated diet and was ambulatory with minimal assist. Pain appropriate and controlled.  Treatments: surgery: bilateral thigh lipectomy 6.19.23  Discharge Exam: Blood pressure 128/67, pulse 65, temperature 98.2 F (36.8 C), temperature source Oral, resp. rate 18, height '5\' 2"'$  (1.575 m), weight 96.8 kg, SpO2 99 %. Incision/Wound:intact dry drainage on dressings expected edema drains scant  Disposition: Discharge disposition: 01-Home or Self Care       Discharge Instructions     Call MD for:  redness, tenderness, or signs of infection (pain, swelling, bleeding, redness, odor or green/yellow discharge around incision site)   Complete by: As directed    Call MD for:  temperature >100.5   Complete by: As directed    Discharge instructions   Complete by: As directed    Ok to remove dressings and shower am 6.21.23. Soap and water ok, pat incisions dry. No creams or ointments over incisions. Do not let drains dangle in shower, attach to lanyard or similar. Strip and record drains twice daily and bring log to clinic visit.  TED hose all other times. May replace dry dressing, ACE wraps over thighs as desired.   Keep legs elevated while sitting. No house yard work or exercise until cleared by MD.   Patient has pain medication at home.   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   BARIATRIC FUSION PO Take 1 tablet by mouth daily.   clindamycin 1 % gel Commonly known as: CLINDAGEL Apply 1 application topically daily as needed (redness of face).   FLUoxetine 40 MG capsule Commonly known as: PROZAC Take 40 mg by mouth daily as needed (depression/anxiety).   fluticasone 50 MCG/ACT nasal spray Commonly known as: FLONASE Place 1-2 sprays into both nostrils daily as needed for allergies or rhinitis.   levothyroxine 200 MCG tablet Commonly known as: SYNTHROID Take 1 tablet (200 mcg total) by mouth daily before breakfast.   lisinopril-hydrochlorothiazide 20-25 MG tablet Commonly known as: ZESTORETIC Take 1 tablet by mouth in the morning.        Follow-up Information     Irene Limbo, MD Follow up in 1 week(s).   Specialty: Plastic Surgery Why: as scheduled Contact information: [address] [place] [address] [phone]  SignedIrene Limbo 04/17/2022, 7:07 AM

## 2022-05-22 ENCOUNTER — Other Ambulatory Visit: Payer: Self-pay | Admitting: Plastic Surgery

## 2022-05-22 DIAGNOSIS — Z9884 Bariatric surgery status: Secondary | ICD-10-CM

## 2022-05-22 DIAGNOSIS — L304 Erythema intertrigo: Secondary | ICD-10-CM

## 2022-05-23 ENCOUNTER — Ambulatory Visit
Admission: RE | Admit: 2022-05-23 | Discharge: 2022-05-23 | Disposition: A | Discharge status: Home / Self Care | Payer: Medicaid Other | Source: Ambulatory Visit | Attending: Plastic Surgery | Admitting: Plastic Surgery

## 2022-05-23 DIAGNOSIS — L304 Erythema intertrigo: Secondary | ICD-10-CM

## 2022-05-23 DIAGNOSIS — Z9884 Bariatric surgery status: Secondary | ICD-10-CM

## 2022-08-18 ENCOUNTER — Other Ambulatory Visit: Payer: Self-pay

## 2022-08-18 ENCOUNTER — Encounter (HOSPITAL_COMMUNITY): Payer: Self-pay | Admitting: Emergency Medicine

## 2022-08-18 ENCOUNTER — Emergency Department (HOSPITAL_COMMUNITY): Payer: Medicaid Other

## 2022-08-18 ENCOUNTER — Emergency Department (HOSPITAL_COMMUNITY)
Admission: EM | Admit: 2022-08-18 | Discharge: 2022-08-19 | Discharge status: Against Medical Advice | Payer: Medicaid Other | Attending: Emergency Medicine | Admitting: Emergency Medicine

## 2022-08-18 DIAGNOSIS — Z5321 Procedure and treatment not carried out due to patient leaving prior to being seen by health care provider: Secondary | ICD-10-CM | POA: Diagnosis not present

## 2022-08-18 DIAGNOSIS — R519 Headache, unspecified: Secondary | ICD-10-CM | POA: Diagnosis not present

## 2022-08-18 DIAGNOSIS — R220 Localized swelling, mass and lump, head: Secondary | ICD-10-CM | POA: Diagnosis not present

## 2022-08-18 LAB — CBC WITH DIFFERENTIAL/PLATELET
Abs Immature Granulocytes: 0.07 10*3/uL (ref 0.00–0.07)
Basophils Absolute: 0.1 10*3/uL (ref 0.0–0.1)
Basophils Relative: 1 %
Eosinophils Absolute: 0.1 10*3/uL (ref 0.0–0.5)
Eosinophils Relative: 1 %
HCT: 44.1 % (ref 36.0–46.0)
Hemoglobin: 14.1 g/dL (ref 12.0–15.0)
Immature Granulocytes: 1 %
Lymphocytes Relative: 25 %
Lymphs Abs: 2.1 10*3/uL (ref 0.7–4.0)
MCH: 28.8 pg (ref 26.0–34.0)
MCHC: 32 g/dL (ref 30.0–36.0)
MCV: 90 fL (ref 80.0–100.0)
Monocytes Absolute: 0.6 10*3/uL (ref 0.1–1.0)
Monocytes Relative: 7 %
Neutro Abs: 5.4 10*3/uL (ref 1.7–7.7)
Neutrophils Relative %: 65 %
Platelets: 297 10*3/uL (ref 150–400)
RBC: 4.9 MIL/uL (ref 3.87–5.11)
RDW: 13.2 % (ref 11.5–15.5)
WBC: 8.3 10*3/uL (ref 4.0–10.5)
nRBC: 0 % (ref 0.0–0.2)

## 2022-08-18 LAB — BASIC METABOLIC PANEL
Anion gap: 9 (ref 5–15)
BUN: 9 mg/dL (ref 6–20)
CO2: 25 mmol/L (ref 22–32)
Calcium: 8.9 mg/dL (ref 8.9–10.3)
Chloride: 101 mmol/L (ref 98–111)
Creatinine, Ser: 0.85 mg/dL (ref 0.44–1.00)
GFR, Estimated: >60 mL/min (ref >60)
Glucose, Bld: 185 mg/dL — ABNORMAL HIGH (ref 70–99)
Potassium: 3.6 mmol/L (ref 3.5–5.1)
Sodium: 135 mmol/L (ref 135–145)

## 2022-08-18 MED ORDER — HYDROCODONE-ACETAMINOPHEN 5-325 MG PO TABS
1.0000 | ORAL_TABLET | Freq: Once | ORAL | Status: AC
  Administered 2022-08-18: 1 via ORAL
  Filled 2022-08-18: qty 1

## 2022-08-18 MED ORDER — ONDANSETRON 4 MG PO TBDP
4.0000 mg | ORAL_TABLET | Freq: Once | ORAL | Status: AC
  Administered 2022-08-18: 4 mg via ORAL
  Filled 2022-08-18: qty 1

## 2022-08-18 NOTE — ED Provider Triage Note (Signed)
Emergency Medicine Provider Triage Evaluation Note  [name] , a 46 y.o. female  was evaluated in triage.  Patient complains of pain to her face.  Reports that she was with her son and his wife and they upset her so she started to have pain and flushing in her face.  Reports that she always has redness in her face when she gets upset as well as when her blood pressure is too high.  Localizes the pain to her superficial face, no headache or blurred vision.  Review of Systems  Positive:  Negative:   Physical Exam  BP (!) 219/135 (BP Location: Right Arm)   Pulse 78   Temp 97.8 F (36.6 C) (Oral)   Resp (!) 27   SpO2 100%  Gen:   Awake, no distress   Resp:  Normal effort  MSK:   Moves extremities without difficulty  Other:  Patient with facial flushing, airway clear and tolerating secretions.  No signs of abscess or infection in the oropharynx.  Patient is complaining of pain in her face and saying that she wants to "stab it out."  Medical Decision Making  Medically screening exam initiated at 8:48 PM.  Appropriate orders placed.  [name] was informed that the remainder of the evaluation will be completed by another provider, this initial triage assessment does not replace that evaluation, and the importance of remaining in the ED until their evaluation is complete.  Basic labs and head CT due to patient's elevated blood pressure and facial/head discomfort.   Kimberly Hammock, PA-C 08/18/22 2049

## 2022-08-18 NOTE — ED Triage Notes (Addendum)
Patient reports facial pain with swelling at upper cheeks with redness onset today , denies injury , no fever or chills. Hypertensive at triage .

## 2022-08-19 NOTE — ED Notes (Signed)
Pt stated that the wait was too long. Pt was advised to seek further medical treatment or contact her PCP. Pt stated that she will go to urgent care in the morning.

## 2022-08-30 ENCOUNTER — Encounter (HOSPITAL_BASED_OUTPATIENT_CLINIC_OR_DEPARTMENT_OTHER): Payer: Self-pay | Admitting: Plastic Surgery

## 2022-08-30 ENCOUNTER — Other Ambulatory Visit: Payer: Self-pay

## 2022-09-03 NOTE — H&P (Signed)
Subjective:     Patient ID: [name] is a 46 y.o. female.     Here for follow up visit prior to planned bilateral brachioplasty. Notes significant soft tissue arms/axillae that develops recurrent rashes. Has failed at least 6 month trial hygiene measures and topical antifungal use.    Thigh lipectcomy 03/2022: Total lipoaspirate 650 ml. 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 Highest weight 390 lb. Lowest weight post bypass 190 lb. Had pregnancy following bypass, delivered 02/2019. Underwent panniculectomy 11/2021. Course complicated by hematoma readmission POD#1 and surgery for evacuation hematoma. Total 5105 g soft tissue resection panniculectomy.   OSA and DM resolved with weight loss. HbA1c 03/2022 6.0. HTN improved post weight loss- reports fewer medications.    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    Lives with infant, who has [place]. Not working currently. Has adult daughter and son in area to assist with post op care.   Review of Systems      Objective:   Physical Exam Cardiovascular:     Rate and Rhythm: Normal rate and regular rhythm.     Heart sounds: Normal heart sounds.  Pulmonary:     Effort: Pulmonary effort is normal.     Breath sounds: Normal breath sounds.    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 Plan lipectomy bilateral axillae and proximal arms. Reviewed location scars. Reviewed most common complication is hypertrophic scarring, scar across axillae can lead to scar contracture, may require  additional surgery/procedures for this. Reviewed will have no effect no lateral chest wall/lateral breast soft tissue rolls.   Additional risks including but not limited to bleeding seroma hematoma damage to adjacent structures infection wound healing problems lymphedema asymmetry blood clots in legs or lungs reviewed.   Discussed OP surgery possible drains need for compression garments and post op limitations.   Rx for oxycodone given.

## 2022-09-04 ENCOUNTER — Encounter (HOSPITAL_BASED_OUTPATIENT_CLINIC_OR_DEPARTMENT_OTHER): Payer: Self-pay | Admitting: Anesthesiology

## 2022-09-06 NOTE — Progress Notes (Signed)

## 2022-09-07 ENCOUNTER — Encounter (HOSPITAL_BASED_OUTPATIENT_CLINIC_OR_DEPARTMENT_OTHER): Payer: Self-pay | Admitting: Plastic Surgery

## 2022-09-07 ENCOUNTER — Other Ambulatory Visit: Payer: Self-pay

## 2022-09-07 ENCOUNTER — Ambulatory Visit (HOSPITAL_BASED_OUTPATIENT_CLINIC_OR_DEPARTMENT_OTHER)
Admission: RE | Admit: 2022-09-07 | Discharge: 2022-09-07 | Disposition: A | Discharge status: Home / Self Care | Payer: Medicaid Other | Source: Ambulatory Visit | Attending: Plastic Surgery | Admitting: Plastic Surgery

## 2022-09-07 ENCOUNTER — Encounter (HOSPITAL_BASED_OUTPATIENT_CLINIC_OR_DEPARTMENT_OTHER)
Admission: RE | Disposition: A | Discharge status: Home / Self Care | Payer: Self-pay | Source: Ambulatory Visit | Attending: Plastic Surgery

## 2022-09-07 DIAGNOSIS — I10 Essential (primary) hypertension: Secondary | ICD-10-CM | POA: Insufficient documentation

## 2022-09-07 DIAGNOSIS — L910 Hypertrophic scar: Secondary | ICD-10-CM | POA: Diagnosis not present

## 2022-09-07 DIAGNOSIS — M793 Panniculitis, unspecified: Secondary | ICD-10-CM | POA: Diagnosis not present

## 2022-09-07 DIAGNOSIS — Z9884 Bariatric surgery status: Secondary | ICD-10-CM | POA: Insufficient documentation

## 2022-09-07 DIAGNOSIS — Z538 Procedure and treatment not carried out for other reasons: Secondary | ICD-10-CM | POA: Insufficient documentation

## 2022-09-07 DIAGNOSIS — Z01818 Encounter for other preprocedural examination: Secondary | ICD-10-CM

## 2022-09-07 DIAGNOSIS — L304 Erythema intertrigo: Secondary | ICD-10-CM | POA: Diagnosis present

## 2022-09-07 SURGERY — LIPECTOMY
Anesthesia: General

## 2022-09-07 MED ORDER — EPINEPHRINE PF 1 MG/ML IJ SOLN
INTRAMUSCULAR | Status: AC
  Filled 2022-09-07: qty 1

## 2022-09-07 MED ORDER — HEPARIN SODIUM (PORCINE) 5000 UNIT/ML IJ SOLN: 5000.0000 [IU] | INTRAMUSCULAR | Status: DC

## 2022-09-07 MED ORDER — LACTATED RINGERS IV SOLN: INTRAVENOUS | Status: DC

## 2022-09-07 MED ORDER — SODIUM BICARBONATE 4.2 % IV SOLN
INTRAVENOUS | Status: AC
  Filled 2022-09-07: qty 20

## 2022-09-07 MED ORDER — HEPARIN SODIUM (PORCINE) 5000 UNIT/ML IJ SOLN
INTRAMUSCULAR | Status: AC
  Filled 2022-09-07: qty 1

## 2022-09-07 MED ORDER — LIDOCAINE HCL (PF) 1 % IJ SOLN
INTRAMUSCULAR | Status: AC
  Filled 2022-09-07: qty 60

## 2022-09-07 MED ORDER — CHLORHEXIDINE GLUCONATE CLOTH 2 % EX PADS: 6.0000 | MEDICATED_PAD | Freq: Once | CUTANEOUS | Status: DC

## 2022-09-07 MED ORDER — ACETAMINOPHEN 500 MG PO TABS
ORAL_TABLET | ORAL | Status: AC
  Filled 2022-09-07: qty 1

## 2022-09-07 MED ORDER — CEFAZOLIN SODIUM-DEXTROSE 2-4 GM/100ML-% IV SOLN: 2.0000 g | INTRAVENOUS | Status: DC

## 2022-09-07 MED ORDER — CELECOXIB 200 MG PO CAPS: 200.0000 mg | ORAL_CAPSULE | ORAL | Status: DC

## 2022-09-07 MED ORDER — GABAPENTIN 300 MG PO CAPS
ORAL_CAPSULE | ORAL | Status: AC
  Filled 2022-09-07: qty 1

## 2022-09-07 MED ORDER — CELECOXIB 200 MG PO CAPS
ORAL_CAPSULE | ORAL | Status: AC
  Filled 2022-09-07: qty 1

## 2022-09-07 MED ORDER — ACETAMINOPHEN 500 MG PO TABS: 1000.0000 mg | ORAL_TABLET | ORAL | Status: DC

## 2022-09-07 MED ORDER — CEFAZOLIN SODIUM-DEXTROSE 2-4 GM/100ML-% IV SOLN
INTRAVENOUS | Status: AC
  Filled 2022-09-07: qty 100

## 2022-09-07 MED ORDER — ACETAMINOPHEN 500 MG PO TABS: 1000.0000 mg | ORAL_TABLET | Freq: Once | ORAL | Status: DC

## 2022-09-07 MED ORDER — GABAPENTIN 300 MG PO CAPS: 300.0000 mg | ORAL_CAPSULE | ORAL | Status: DC

## 2022-09-07 MED ORDER — ACETAMINOPHEN 500 MG PO TABS
ORAL_TABLET | ORAL | Status: AC
  Filled 2022-09-07: qty 2

## 2022-09-07 NOTE — Progress Notes (Signed)
Per Dr. Doroteo Glassman, please be sure to have patient take all meds prior to surgery including blood pressure medicine. Patient is also a better candidate for surgery at main and not day surgery.

## 2022-09-07 NOTE — Anesthesia Preprocedure Evaluation (Addendum)
Anesthesia Evaluation  Patient identified by MRN, date of birth, ID band Patient awake    Reviewed: Allergy & Precautions, NPO status , Patient's Chart, lab work & pertinent test results  Airway Mallampati: IV  TM Distance: >3 FB Neck ROM: Full    Dental  (+) Dental Advisory Given   Pulmonary sleep apnea (does not use cpap since bariatric surgery in 2018, but doesn't remember if she was retested for OSA- airway and neck exam concerning for OSA)    Pulmonary exam normal breath sounds clear to auscultation       Cardiovascular hypertension, Pt. on medications Normal cardiovascular exam Rhythm:Regular Rate:Normal     Neuro/Psych  PSYCHIATRIC DISORDERS Anxiety Depression    negative neurological ROS     GI/Hepatic Neg liver ROS,GERD  Controlled,,S/p roux en y 2018   Endo/Other  diabetes, Poorly Controlled, Type 2Hypothyroidism  Morbid obesity  Renal/GU negative Renal ROS  negative genitourinary   Musculoskeletal negative musculoskeletal ROS (+)    Abdominal  (+) + obese  Peds  Hematology negative hematology ROS (+)   Anesthesia Other Findings   Reproductive/Obstetrics negative OB ROS                             Anesthesia Physical Anesthesia Plan  ASA: 3  Anesthesia Plan: General   Post-op Pain Management: Tylenol PO (pre-op)*, Dilaudid IV and Ketamine IV*   Induction: Intravenous  PONV Risk Score and Plan: 3 and Ondansetron, Dexamethasone, Midazolam and Treatment may vary due to age or medical condition  Airway Management Planned: Oral ETT  Additional Equipment: None  Intra-op Plan:   Post-operative Plan: Extubation in OR  Informed Consent:   Plan Discussed with:   Anesthesia Plan Comments: (Surgeon requesting EJ PIV for case d/t operation on B/L UE- discussed with her that this will likely not be reliable access for a 6 hour case in a morbidly obese patient during which both  arms may be manipulated continuously or positioned in a way that will compromise EJ flow. Will assess for lower extremity access. If central line is required, will not be able to do case at a day surgery center.    Case cancelled in preop: spoke to patient (informed consent) regarding access issue and that we will attempt to find ultrasound-guided peripheral access in one of the lower extremities as her neck does not seem favorable for an EJ. Pt requesting we gain IV access after she is already under anesthesia, which I told her will not be possible. Blood pressure was also ranging from 751-700 systolic on upper and lower extremities (will be using lower extremity NIBP for case). Pt becoming increasingly agitated by discussion about anesthesia and her blood pressure, so we allowed her to rest in a dark preop bay for ten minutes before rechecking her blood pressure, which was still 240 SBP. Decision w/ surgeon to postpone surgery, recommended that patient call PCP immediately and try to be seen ASAP or go to urgent care. Take all BP meds today.  I also attempted to have a conversation with the patient about taking all prescribed medications (including prozac and all antihypertensives) the day of her next scheduled surgery to hopefully avoid this situation again, however she was extremely emotional and refused to speak with me again before leaving so I was unable to deliver this information to her.   In the future, this patient would be a better candidate for [facility] instead of a  surgery center due to access issues, likely untreated OSA, morbid obesity and uncontrolled hypertension. Blood sugar was not checked today.  I think if IJ central venous access is going to be required for the case, she may benefit from induction of GA with a peripheral IV and then an asleep neck line. Based on today's encounter, I do not think she will tolerate an awake neck line. )       Anesthesia Quick Evaluation

## 2022-09-07 NOTE — H&P (Signed)
BP elevated. Case canceled. Recommend see PCP. Will reschedule to Main OR when suitable as patient desires discussion with anesthesia prior to day of surgery.

## 2022-09-07 NOTE — Interval H&P Note (Signed)
History and Physical Interval Note:  09/07/2022 8:48 AM  [name]  has presented today for surgery, with the diagnosis of Intertrigo, history bariatric surgery.  The various methods of treatment have been discussed with the patient and family. After consideration of risks, benefits and other options for treatment, the patient has consented to  Procedure(s): LIPECTOMY BILATERAL ARM AND AXILLAE (Bilateral) as a surgical intervention.  The patient's history has been reviewed, patient examined, no change in status, stable for surgery.  I have reviewed the patient's chart and labs.  Questions were answered to the patient's satisfaction.     Arnoldo Hooker [name]

## 2022-09-07 NOTE — Progress Notes (Signed)
Case cancelled in preop: spoke to patient (informed consent) regarding IV access issue (surgeon requesting external jugular PIV as both arms will be operated on) and that we will attempt to find ultrasound-guided peripheral access in one of the lower extremities as her neck does not seem favorable for an EJ, nor is it reliable access for a 6 hour case. Pt requesting we gain IV access after she is already under anesthesia, which I told her will not be possible. Blood pressure was also ranging from 076-226 systolic on upper and lower extremities (will be using lower extremity NIBP for case) before and after my discussion with her.   Pt became increasingly agitated by discussion about anesthesia and her high blood pressure, so we allowed her to rest in a dark preop bay for ten minutes before rechecking her blood pressure, which was still 240 SBP. Decision w/ surgeon to postpone surgery, recommended that patient call PCP immediately and try to be seen ASAP or go to urgent care. Take all BP meds today.  I also attempted to have a conversation with the patient about taking all prescribed medications (including prozac and all antihypertensives) the day of her next scheduled surgery to hopefully avoid this situation again, however she was extremely emotional and refused to speak with me again before leaving so I was unable to deliver this information to her.   In the future, this patient would be a better candidate for [facility] instead of a surgery center due to access issues, likely untreated OSA, morbid obesity and uncontrolled hypertension. Blood sugar was not checked today.

## 2022-10-10 ENCOUNTER — Ambulatory Visit: Payer: Medicaid Other | Attending: Obstetrics and Gynecology | Admitting: Physical Therapy

## 2022-10-10 ENCOUNTER — Other Ambulatory Visit: Payer: Self-pay

## 2022-10-10 DIAGNOSIS — N393 Stress incontinence (female) (male): Secondary | ICD-10-CM | POA: Insufficient documentation

## 2022-10-10 DIAGNOSIS — R293 Abnormal posture: Secondary | ICD-10-CM | POA: Insufficient documentation

## 2022-10-10 DIAGNOSIS — M6281 Muscle weakness (generalized): Secondary | ICD-10-CM | POA: Insufficient documentation

## 2022-10-10 DIAGNOSIS — R279 Unspecified lack of coordination: Secondary | ICD-10-CM | POA: Diagnosis not present

## 2022-10-10 NOTE — Therapy (Signed)
OUTPATIENT PHYSICAL THERAPY FEMALE PELVIC EVALUATION   Patient Name: [name] MRN: [id number] DOB:[birth year], 46 y.o., female Today's Date: 10/10/2022  END OF SESSION:  PT End of Session - 10/10/22 1147     Visit Number 1    Date for PT Re-Evaluation 01/09/23    Authorization Type Medicaid  [employer]    PT Start Time 1146    PT Stop Time 1217    PT Time Calculation (min) 31 min    Activity Tolerance No increased pain    Behavior During Therapy WFL for tasks assessed/performed             Past Medical History:  Diagnosis Date   Anemia    Carpal tunnel syndrome, bilateral    Chronic hypertension    Depression    GERD (gastroesophageal reflux disease)    Gestational diabetes    H. pylori infection    dx  09-02-2014-- treated w/ antibiotic   Hypertension    recently low pressures    Hypothyroid    Irregular menstrual cycle    Menorrhagia    PCOS (polycystic ovarian syndrome)    Type 2 diabetes mellitus ([place])    Past Surgical History:  Procedure Laterality Date   CARPAL TUNNEL RELEASE Bilateral 10/07/2014   Procedure: CARPAL TUNNEL RELEASE BILATERAL;  Surgeon: [name], MD;  [facility]: [facility];  Service: Orthopedics;  Laterality: Bilateral;   CESAREAN SECTION  10/29/1993   CESAREAN SECTION WITH BILATERAL TUBAL LIGATION N/A 03/27/2019   Procedure: CESAREAN SECTION WITH BILATERAL TUBAL LIGATION;  Surgeon: Bobbye Charleston, MD;  Location: [facility];  Service: Obstetrics;  Laterality: N/A;   DILITATION & CURRETTAGE/HYSTROSCOPY WITH NOVASURE ABLATION N/A 11/08/2021   Procedure: DILATATION & CURETTAGE/HYSTEROSCOPY WITH NOVASURE ABLATION;  Surgeon: Bobbye Charleston, MD;  Location: [facility];  Service: Gynecology;  Laterality: N/A;   EYE SURGERY  09/13/2020   cataract bilateral   GASTRIC ROUX-EN-Y N/A 03/12/2017   Procedure: LAPAROSCOPIC ROUX-EN-Y GASTRIC BYPASS WITH UPPER ENDOSCOPY;  Surgeon: Kieth Brightly, Arta Bruce, MD;  Location: [facility];  Service: General;  Laterality: N/A;   HEMATOMA EVACUATION  12/09/2021   Procedure: EVACUATION HEMATOMA;  Surgeon: Irene Limbo, MD;  Location: [facility];  Service: Plastics;;   I & D EXTREMITY N/A 12/09/2021   Procedure: IRRIGATION AND DEBRIDEMENT ABDOMEN;  Surgeon: Irene Limbo, MD;  Location: [facility];  Service: Plastics;  Laterality: N/A;   PANNICULECTOMY N/A 12/08/2021   Procedure: PANNICULECTOMY;  Surgeon: Irene Limbo, MD;  Location: [facility];  Service: Plastics;  Laterality: N/A;   TUBAL LIGATION  03/27/2019   UPPER GI ENDOSCOPY  03/12/2017   Procedure: UPPER GI ENDOSCOPY;  Surgeon: Kieth Brightly Arta Bruce, MD;  Location: [facility];  Service: General;;   Patient Active Problem List   Diagnosis Date Noted   S/P panniculectomy 12/09/2021   Iron deficiency anemia 06/26/2021   Vaginal bleeding in pregnancy, third trimester 03/11/2019   GBS bacteriuria 01/22/2019   Pregnancy of unknown anatomic location 08/10/2018   Intertrigo 11/22/2017   S/P gastric bypass 04/04/2017   Morbid obesity ([place]) 03/12/2017   Hypothyroidism 09/11/2016   Allergic rhinitis due to pollen 06/29/2015   Anxiety and depression 06/29/2015   Gastroesophageal reflux disease without esophagitis 06/29/2015   OSA on CPAP 06/29/2015   History of syphilis 05/23/2015   Irregular menstrual cycle 11/12/2013   POLYCYSTIC OVARIAN DISEASE 06/20/2009    PCP: Reginia Forts female, MD  REFERRING PROVIDER: Bobbye Charleston, MD  REFERRING DIAG: N39.3 (ICD-10-CM) - Stress incontinence (female) (female)  THERAPY DIAG:  Unspecified lack of coordination  Abnormal posture  Muscle weakness (generalized)  Rationale for Evaluation and Treatment: Rehabilitation  ONSET DATE: jan 2023  SUBJECTIVE:                                                                                                                                                                                            SUBJECTIVE STATEMENT: "Every time I cough or sneeze I pee on myself". Small leakage reported. Also with laughing, exercising. Also urgency with leakage too. Wears pads but causes rash and sometimes leaves toilet paper at underwear. Pt reports she has had 5 day history of vaginal discharge with clear/blood tinged mucus noted and hasn't told gyn yet.   Fluid intake: Yes: water - 2 small bottles. Coffee in morning and sometimes poweraide. Rarely soda.     PAIN:  Are you having pain? Yes NPRS scale: 8/10 Pain location:  anterior low abdomen and low back at the same time  Pain type: cramping, tight  Pain description: intermittent   Aggravating factors: unsure seems random. Before would have pain prior to periods and sometimes afterward but now no longer having periods.  Relieving factors: resting  PRECAUTIONS: None  WEIGHT BEARING RESTRICTIONS: No  FALLS:  Has patient fallen in last 6 months? 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  PLOF: Independent  PATIENT GOALS: to have less leakage  PERTINENT HISTORY:  diabetes, Poorly Controlled, Type 2 Hypothyroidism,   Morbid obesity, Anxiety Depression, GERD,  hypertension, gastric bypass 2018, abdominal and bil adductor skin removal   Sexual abuse: No  BOWEL MOVEMENT: Pain with bowel movement: No Type of bowel movement:Type ([place] Stool Scale) 4, Frequency daily sometimes 2-3x (largest in morning, others smaller), and Strain No Fully empty rectum: Yes:   Leakage: No Pads: No Fiber supplement: No  URINATION: Pain with urination: No Fully empty bladder: Yes: but sometimes feels like she will go again 30 mins later Stream: Strong Urgency: Yes:   Frequency: with coffee quicker than 2 hours but not usually, not regularly up at night  Leakage: Urge to void, Coughing, Sneezing, Laughing, Exercise, and Lifting Pads: Yes:  sometimes  INTERCOURSE: Pain with intercourse:  not active  no painful   PREGNANCY: Vaginal deliveries 1 Tearing No C-section deliveries 2 Currently pregnant No  PROLAPSE: None   OBJECTIVE:   DIAGNOSTIC FINDINGS:   [id number]  COGNITION: Overall cognitive status: Within functional limits for tasks assessed     SENSATION:  Light touch: Appears intact Proprioception: Appears intact  MUSCLE LENGTH: Bil hamstrings and adductors limited by 50%   POSTURE: rounded shoulders, forward head, and posterior pelvic tilt  PELVIC ALIGNMENT:  LUMBARAROM/PROM:  A/PROM A/PROM  eval  Flexion Decreased by 50% with abdominal pain reported  Extension Decreased by 25%   Right lateral flexion Decreased by 50% with abdominal pain reported  Left lateral flexion Decreased by 50% with abdominal pain reported  Right rotation Decreased by 50% with abdominal pain reported  Left rotation Decreased by 50% with abdominal pain reported   (Blank rows = not tested)  LOWER EXTREMITY ROM:  WFL  LOWER EXTREMITY MMT:  Bil hips 3+/5, knees and ankles 4+/5  PALPATION:   General  TTP throughout midline of abdomen, profoundly tight tissue restrictions throughout midline and low abdominal quadrants and pt has swelling midline with scarring sites post skin removal surgery with transabdominal and bil adductors. Skin changes at scar sites with darkening and very restricted throughout sites in all directions. Midline lower abdominal quadrant most restricted and swollen with stiffness limiting all mobility over bladder. Bil adductors proximally restricted in all directions due to scar restrictions as well and bilaterally pt has areas of scar sites that are red with white dry skin noted and she reports due to friction.                  External Perineal Exam dryness noted externally. Pt reports she has a lot of skin allergies and doesn't know what pads to use and has been using toilet paper in panties for  leakage. Pt's vulva and clitoral hood moving well without restrictions does have some labia minor adhesion noted however no redness and tissue WFL.                              Internal Pelvic Floor pt denied all TTP  Patient confirms identification and approves PT to assess internal pelvic floor and treatment Yes  PELVIC MMT:   MMT eval  Vaginal 4/5, 9s, 9 reps  Internal Anal Sphincter   External Anal Sphincter   Puborectalis   Diastasis Recti Great difficulty contracting core to lift shoulders slightly from table. Heavy valsalva noted with attempt and face reddening. Noted lower abdominal bulge worsening with this however difficulty to feel for separation due to tissue restriction and poor tissue mobility at scar sites.   (Blank rows = not tested)        TONE: WFL  PROLAPSE: Not seen in hooklying   TODAY'S TREATMENT:                                                                                                                              DATE:   10/10/22 EVAL Examination completed, findings reviewed, pt educated on POC and scar mobility handout given and reviewed with PT demonstrating how to complete on pt. Pt motivated to participate  in PT and agreeable to attempt recommendations.     PATIENT EDUCATION:  Education details: scar mobility  Person educated: Patient Education method: Explanation, Demonstration, Tactile cues, Verbal cues, and Handouts Education comprehension: verbalized understanding and returned demonstration  HOME EXERCISE PROGRAM: Scar mobility   ASSESSMENT:  CLINICAL IMPRESSION: Patient is a 46 y.o. female  who was seen today for physical therapy evaluation and treatment for stress incontinence. Pt reports she has leakage with sneezing/laughing/coughing and exercises and sometimes urgency with inability to get to bathroom quickly enough. Pt reports she tries pads to help with leakage but has skin reactions to this and frequently gets rashes around vulva  because of this so she uses toilet paper in underwear instead but feels irritated with this too. Pt also has history of gastric bypass in 2018 and had skin removal at abdomen in feb 2023, bil thighs summer of 2023 and plans to have arms in a few months. 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 Midline low abdominal area has noted swelling and tissue density with very limited mobility noted over bladder. Pt reports she wears high waisted jeans a lot of the time. Pt given handout and PT reviewed with demonstration on pt for scar mobility at home. Bil adductors also have scaring with similar appearance and restrictions and small areas of scar with redness and white dry skin pt reports this is due to friction. All fully closed. Pt consented to internal vaginal assessment this date and found to have decreased mild decreased strength, coordination and endurance but good mobility of tissue for contraction and relaxation however needed moderate cues to attempt with breathing mechanics as pt attempts contraction with inhale and valsalva. Pt would benefit from additional PT to further address deficits.  Pt also educated on [place] with internal assessment used to assess if pt is able to complete which she is.   OBJECTIVE IMPAIRMENTS: decreased coordination, decreased endurance, decreased mobility, decreased strength, increased fascial restrictions, increased muscle spasms, impaired flexibility, improper body mechanics, postural dysfunction, and pain.   ACTIVITY LIMITATIONS: carrying, lifting, squatting, continence, and locomotion level  PARTICIPATION LIMITATIONS: community activity  PERSONAL FACTORS: Fitness, Time since onset of injury/illness/exacerbation, and 1 comorbidity: medical history   are also affecting patient's functional outcome.   REHAB POTENTIAL: Good  CLINICAL DECISION MAKING:  Stable/uncomplicated  EVALUATION COMPLEXITY: Low   GOALS: Goals reviewed with patient? Yes  SHORT TERM GOALS: Target date: 10/08/23  Pt to be I with HEP.  Baseline: Goal status: INITIAL  2.  Pt to be I with scar mobility and diaphragmatic breathing for improved abdominal mobility and decreased tissue restrictions.  Baseline:  Goal status: INITIAL  3.  Pt to demonstrate improved coordination with pelvic floor and breathing mechanics with body weight squats for pelvic stability and decreased leakage.  Baseline:  Goal status: INITIAL   LONG TERM GOALS: Target date: 01/09/23  Pt to be I with advanced HEP.  Baseline:  Goal status: INITIAL  2.  Pt to demonstrate improved coordination with pelvic floor and breathing mechanics with 20#  squats for pelvic stability and decreased leakage.  Baseline:  Goal status: INITIAL  3.  Pt to report no more than 1 instance of urinary leakage per month for improved QOL and skin integrity.  Baseline:  Goal status: INITIAL  4.  Pt will have 50% less urgency due to bladder retraining and strengthening  Baseline:  Goal status: INITIAL  5.  Pt to report improved time between bladder voids to at least 2.5 hours with increase in fluid intake for improved QOL with decreased urinary frequency.   Baseline:  Goal status: INITIAL  6.  Pt to demonstrate improved thoracic and lumbar mobility to [facility] of ROM for improved mobility at spine and allow more mobility at abdomen.  Baseline:  Goal status: INITIAL  PLAN:  PT FREQUENCY: 2x/week  PT DURATION:  16 sessions  PLANNED INTERVENTIONS: Therapeutic exercises, Therapeutic activity, Neuromuscular re-education, Patient/Family education, Self Care, Joint mobilization, Aquatic Therapy, Dry Needling, Electrical stimulation, Spinal mobilization, Cryotherapy, Moist heat, scar mobilization, Taping, Traction, Biofeedback, and Manual therapy  PLAN FOR NEXT SESSION: bladder irritants, urge drill, breathing  mechanics, abdominal/spine/hip stretching, voiding mechanics, dry needling and scar mobility, pubic and bladder mobility  Stacy Gardner, PT, DPT 12/13/232:35 PM

## 2022-10-17 ENCOUNTER — Ambulatory Visit: Payer: Medicaid Other

## 2022-10-17 DIAGNOSIS — R279 Unspecified lack of coordination: Secondary | ICD-10-CM

## 2022-10-17 DIAGNOSIS — N393 Stress incontinence (female) (male): Secondary | ICD-10-CM | POA: Diagnosis not present

## 2022-10-17 DIAGNOSIS — R293 Abnormal posture: Secondary | ICD-10-CM

## 2022-10-17 DIAGNOSIS — M6281 Muscle weakness (generalized): Secondary | ICD-10-CM

## 2022-10-17 NOTE — Therapy (Signed)
OUTPATIENT PHYSICAL THERAPY TREATMENT NOTE   Patient Name: [name] MRN: [id number] DOB:1976-08-27, 46 y.o., female Today's Date: 10/17/2022  PCP: Wardell Honour, MD REFERRING PROVIDER: Bobbye Charleston, MD   END OF SESSION:   PT End of Session - 10/17/22 1616     Visit Number 2    Date for PT Re-Evaluation 01/09/23    Authorization Type Medicaid  [employer]    PT Start Time 1615    PT Stop Time 1655    PT Time Calculation (min) 40 min    Activity Tolerance Patient tolerated treatment well    Behavior During Therapy [facility] for tasks assessed/performed             Past Medical History:  Diagnosis Date   Anemia    Carpal tunnel syndrome, bilateral    Chronic hypertension    Depression    GERD (gastroesophageal reflux disease)    Gestational diabetes    H. pylori infection    dx  09-02-2014-- treated w/ antibiotic   Hypertension    recently low pressures    Hypothyroid    Irregular menstrual cycle    Menorrhagia    PCOS (polycystic ovarian syndrome)    Type 2 diabetes mellitus ([place])    Past Surgical History:  Procedure Laterality Date   CARPAL TUNNEL RELEASE Bilateral 10/07/2014   Procedure: CARPAL TUNNEL RELEASE BILATERAL;  Surgeon: [name], MD;  Location: [place];  Service: Orthopedics;  Laterality: Bilateral;   CESAREAN SECTION  10/29/1993   CESAREAN SECTION WITH BILATERAL TUBAL LIGATION N/A 03/27/2019   Procedure: CESAREAN SECTION WITH BILATERAL TUBAL LIGATION;  Surgeon: Bobbye Charleston, MD;  Location: [place] [facility];  Service: Obstetrics;  Laterality: N/A;   DILITATION & CURRETTAGE/HYSTROSCOPY WITH NOVASURE ABLATION N/A 11/08/2021   Procedure: DILATATION & CURETTAGE/HYSTEROSCOPY WITH NOVASURE ABLATION;  Surgeon: Bobbye Charleston, MD;  Location: [place];  Service: Gynecology;  Laterality: N/A;   EYE SURGERY  09/13/2020   cataract bilateral   GASTRIC ROUX-EN-Y N/A 03/12/2017   Procedure: LAPAROSCOPIC  ROUX-EN-Y GASTRIC BYPASS WITH UPPER ENDOSCOPY;  Surgeon: Kieth Brightly, Arta Bruce, MD;  Location: [facility];  Service: General;  Laterality: N/A;   HEMATOMA EVACUATION  12/09/2021   Procedure: EVACUATION HEMATOMA;  Surgeon: Irene Limbo, MD;  Location: [place];  Service: Plastics;;   I & D EXTREMITY N/A 12/09/2021   Procedure: IRRIGATION AND DEBRIDEMENT ABDOMEN;  Surgeon: Irene Limbo, MD;  Location: [place];  Service: Plastics;  Laterality: N/A;   PANNICULECTOMY N/A 12/08/2021   Procedure: PANNICULECTOMY;  Surgeon: Irene Limbo, MD;  Location: [place];  Service: Plastics;  Laterality: N/A;   TUBAL LIGATION  03/27/2019   UPPER GI ENDOSCOPY  03/12/2017   Procedure: UPPER GI ENDOSCOPY;  Surgeon: Kieth Brightly Arta Bruce, MD;  Location: [facility];  Service: General;;   Patient Active Problem List   Diagnosis Date Noted   S/P panniculectomy 12/09/2021   Iron deficiency anemia 06/26/2021   Vaginal bleeding in pregnancy, third trimester 03/11/2019   GBS bacteriuria 01/22/2019   Pregnancy of unknown anatomic location 08/10/2018   Intertrigo 11/22/2017   S/P gastric bypass 04/04/2017   Morbid obesity ([place]) 03/12/2017   Hypothyroidism 09/11/2016   Allergic rhinitis due to pollen 06/29/2015   Anxiety and depression 06/29/2015   Gastroesophageal reflux disease without esophagitis 06/29/2015   OSA on CPAP 06/29/2015   History of syphilis 05/23/2015   Irregular menstrual cycle 11/12/2013   POLYCYSTIC OVARIAN DISEASE 06/20/2009  REFERRING DIAG: N39.3 (ICD-10-CM) - Stress incontinence (female) (female)  THERAPY DIAG:  Unspecified lack of coordination  Abnormal posture  Muscle weakness (generalized)  Rationale for Evaluation and Treatment Rehabilitation  PERTINENT HISTORY: diabetes, Poorly Controlled, Type 2 Hypothyroidism,   Morbid obesity, Anxiety Depression, GERD,  hypertension, gastric bypass 2018, abdominal and bil adductor skin removal   PRECAUTIONS:  NA  SUBJECTIVE:                                                                                                                                                                                      SUBJECTIVE STATEMENT:  Pt states that she has been trying to use the knack, but it is hard.    PAIN:  Are you having pain? Yes: NPRS scale: 0/10 Pain location: low back/abdomen Pain description: cramping/pulling Aggravating factors: random Relieving factors: nothing  SUBJECTIVE STATEMENT: "Every time I cough or sneeze I pee on myself". Small leakage reported. Also with laughing, exercising. Also urgency with leakage too. Wears pads but causes rash and sometimes leaves toilet paper at underwear. Pt reports she has had 5 day history of vaginal discharge with clear/blood tinged mucus noted and hasn't told gyn yet.    Fluid intake: Yes: water - 2 small bottles. Coffee in morning and sometimes poweraide. Rarely soda.      PAIN:  Are you having pain? Yes NPRS scale: 8/10 Pain location:  anterior low abdomen and low back at the same time   Pain type: cramping, tight  Pain description: intermittent    Aggravating factors: unsure seems random. Before would have pain prior to periods and sometimes afterward but now no longer having periods.  Relieving factors: resting   PRECAUTIONS: None   WEIGHT BEARING RESTRICTIONS: No   FALLS:  Has patient fallen in last 6 months? 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   PLOF: Independent   PATIENT GOALS: to have less leakage   PERTINENT HISTORY:  diabetes, Poorly Controlled, Type 2 Hypothyroidism,   Morbid obesity, Anxiety Depression, GERD,  hypertension, gastric bypass 2018, abdominal and bil adductor skin removal    Sexual abuse: No   BOWEL MOVEMENT: Pain with bowel movement: No Type of bowel movement:Type (B[place] Stool Scale) 4,  Frequency daily sometimes 2-3x (largest in morning, others smaller), and Strain No Fully empty rectum: Yes:   Leakage: No Pads: No Fiber supplement: No   URINATION: Pain with urination: No Fully empty bladder: Yes: but sometimes feels like she will go again 30 mins later Stream: Strong Urgency: Yes:   Frequency: with coffee  quicker than 2 hours but not usually, not regularly up at night  Leakage: Urge to void, Coughing, Sneezing, Laughing, Exercise, and Lifting Pads: Yes: sometimes   INTERCOURSE: Pain with intercourse:  not active  no painful    PREGNANCY: Vaginal deliveries 1 Tearing No C-section deliveries 2 Currently pregnant No   PROLAPSE: None     OBJECTIVE:    DIAGNOSTIC FINDINGS:    [id number]   COGNITION: Overall cognitive status: Within functional limits for tasks assessed                          SENSATION: Light touch: Appears intact Proprioception: Appears intact   MUSCLE LENGTH: Bil hamstrings and adductors limited by 50%     POSTURE: rounded shoulders, forward head, and posterior pelvic tilt   PELVIC ALIGNMENT:   LUMBARAROM/PROM:   A/PROM A/PROM  eval  Flexion Decreased by 50% with abdominal pain reported  Extension Decreased by 25%   Right lateral flexion Decreased by 50% with abdominal pain reported  Left lateral flexion Decreased by 50% with abdominal pain reported  Right rotation Decreased by 50% with abdominal pain reported  Left rotation Decreased by 50% with abdominal pain reported   (Blank rows = not tested)   LOWER EXTREMITY ROM:   WFL   LOWER EXTREMITY MMT:   Bil hips 3+/5, knees and ankles 4+/5   PALPATION:   General  TTP throughout midline of abdomen, profoundly tight tissue restrictions throughout midline and low abdominal quadrants and pt has swelling midline with scarring sites post skin removal surgery with transabdominal and bil adductors. Skin changes at scar sites with darkening and very restricted throughout sites  in all directions. Midline lower abdominal quadrant most restricted and swollen with stiffness limiting all mobility over bladder. Bil adductors proximally restricted in all directions due to scar restrictions as well and bilaterally pt has areas of scar sites that are red with white dry skin noted and she reports due to friction.                   External Perineal Exam dryness noted externally. Pt reports she has a lot of skin allergies and doesn't know what pads to use and has been using toilet paper in panties for leakage. Pt's vulva and clitoral hood moving well without restrictions does have some labia minor adhesion noted however no redness and tissue WFL.                              Internal Pelvic Floor pt denied all TTP   Patient confirms identification and approves PT to assess internal pelvic floor and treatment Yes   PELVIC MMT:   MMT eval  Vaginal 4/5, 9s, 9 reps  Internal Anal Sphincter    External Anal Sphincter    Puborectalis    Diastasis Recti Great difficulty contracting core to lift shoulders slightly from table. Heavy valsalva noted with attempt and face reddening. Noted lower abdominal bulge worsening with this however difficulty to feel for separation due to tissue restriction and poor tissue mobility at scar sites.   (Blank rows = not tested)         TONE: WFL   PROLAPSE: Not seen in hooklying    TODAY'S TREATMENT 10/17/22: Manual: Abdominal scar tissue release Negative pressure soft tissue mobilization to lower abdomen Abdominal soft tissue mobilization Neuromuscular re-education:  Exercises: Bent knee fall out Kneeling  hip flexor stretch Bridge Standing adductor stretch                                                                                                                            DATE:   10/10/22 EVAL Examination completed, findings reviewed, pt educated on POC and scar mobility handout given and reviewed with PT demonstrating how to  complete on pt. Pt motivated to participate in PT and agreeable to attempt recommendations.       PATIENT EDUCATION:  Education details: scar mobility  Person educated: Patient Education method: Explanation, Demonstration, Tactile cues, Verbal cues, and Handouts Education comprehension: verbalized understanding and returned demonstration   HOME EXERCISE PROGRAM: [id number]    ASSESSMENT:   CLINICAL IMPRESSION: Pt is having a difficult time performing the knack and having success with it. Abdominal manual techniques performed to scar tissue and muscles with excellent improvement in sensation and reduction in sensation of tightness. No palpable improvements in nodules surrounding lower abdominal scar tissue around midline/Rt side. She did well with all mobility exercises; she was encouraged not to press into any low back pain. She will continue to benefit from skilled PT intervention in order to reduce urinary incontinence and improve low back/abdominal pain.    OBJECTIVE IMPAIRMENTS: decreased coordination, decreased endurance, decreased mobility, decreased strength, increased fascial restrictions, increased muscle spasms, impaired flexibility, improper body mechanics, postural dysfunction, and pain.    ACTIVITY LIMITATIONS: carrying, lifting, squatting, continence, and locomotion level   PARTICIPATION LIMITATIONS: community activity   PERSONAL FACTORS: Fitness, Time since onset of injury/illness/exacerbation, and 1 comorbidity: medical history   are also affecting patient's functional outcome.    REHAB POTENTIAL: Good   CLINICAL DECISION MAKING: Stable/uncomplicated   EVALUATION COMPLEXITY: Low     GOALS: Goals reviewed with patient? Yes   SHORT TERM GOALS: Target date: 10/08/23   Pt to be I with HEP.  Baseline: Goal status: INITIAL   2.  Pt to be I with scar mobility and diaphragmatic breathing for improved abdominal mobility and decreased tissue restrictions.  Baseline:   Goal status: INITIAL   3.  Pt to demonstrate improved coordination with pelvic floor and breathing mechanics with body weight squats for pelvic stability and decreased leakage.  Baseline:  Goal status: INITIAL     LONG TERM GOALS: Target date: 01/09/23   Pt to be I with advanced HEP.  Baseline:  Goal status: INITIAL   2.  Pt to demonstrate improved coordination with pelvic floor and breathing mechanics with 20#  squats for pelvic stability and decreased leakage.  Baseline:  Goal status: INITIAL   3.  Pt to report no more than 1 instance of urinary leakage per month for improved QOL and skin integrity.  Baseline:  Goal status: INITIAL   4.  Pt will have 50% less urgency due to bladder retraining and strengthening  Baseline:  Goal status: INITIAL   5.  Pt to report improved time between bladder voids to  at least 2.5 hours with increase in fluid intake for improved QOL with decreased urinary frequency.   Baseline:  Goal status: INITIAL   6.  Pt to demonstrate improved thoracic and lumbar mobility to [facility] of ROM for improved mobility at spine and allow more mobility at abdomen.  Baseline:  Goal status: INITIAL   PLAN:   PT FREQUENCY: 2x/week   PT DURATION:  16 sessions   PLANNED INTERVENTIONS: Therapeutic exercises, Therapeutic activity, Neuromuscular re-education, Patient/Family education, Self Care, Joint mobilization, Aquatic Therapy, Dry Needling, Electrical stimulation, Spinal mobilization, Cryotherapy, Moist heat, scar mobilization, Taping, Traction, Biofeedback, and Manual therapy   PLAN FOR NEXT SESSION: Perform internal pelvic floor exam and begin strengthening program; core training/strengthening program; urge suppression technique.    Heather Roberts, PT, DPT12/20/235:00 PM

## 2022-10-24 ENCOUNTER — Ambulatory Visit: Payer: Medicaid Other

## 2022-10-24 DIAGNOSIS — R293 Abnormal posture: Secondary | ICD-10-CM

## 2022-10-24 DIAGNOSIS — N393 Stress incontinence (female) (male): Secondary | ICD-10-CM | POA: Diagnosis not present

## 2022-10-24 DIAGNOSIS — R279 Unspecified lack of coordination: Secondary | ICD-10-CM

## 2022-10-24 DIAGNOSIS — M6281 Muscle weakness (generalized): Secondary | ICD-10-CM

## 2022-10-24 NOTE — Therapy (Signed)
OUTPATIENT PHYSICAL THERAPY TREATMENT NOTE   Patient Name: [name] MRN: [id number] DOB:[birth year], 46 y.o., female Today's Date: 10/24/2022  PCP: Wardell Honour, MD REFERRING PROVIDER: Bobbye Charleston, MD   END OF SESSION:   PT End of Session - 10/24/22 1616     Visit Number 3    Date for PT Re-Evaluation 01/09/23    Authorization Type Medicaid  [employer]    PT Start Time 1615    PT Stop Time 1655    PT Time Calculation (min) 40 min    Activity Tolerance Patient tolerated treatment well    Behavior During Therapy [facility] for tasks assessed/performed              Past Medical History:  Diagnosis Date   Anemia    Carpal tunnel syndrome, bilateral    Chronic hypertension    Depression    GERD (gastroesophageal reflux disease)    Gestational diabetes    H. pylori infection    dx  09-02-2014-- treated w/ antibiotic   Hypertension    recently low pressures    Hypothyroid    Irregular menstrual cycle    Menorrhagia    PCOS (polycystic ovarian syndrome)    Type 2 diabetes mellitus ([place])    Past Surgical History:  Procedure Laterality Date   CARPAL TUNNEL RELEASE Bilateral 10/07/2014   Procedure: CARPAL TUNNEL RELEASE BILATERAL;  Surgeon: [name], MD;  Location: [place];  Service: Orthopedics;  Laterality: Bilateral;   CESAREAN SECTION  10/29/1993   CESAREAN SECTION WITH BILATERAL TUBAL LIGATION N/A 03/27/2019   Procedure: CESAREAN SECTION WITH BILATERAL TUBAL LIGATION;  Surgeon: Bobbye Charleston, MD;  Location: [place] [facility];  Service: Obstetrics;  Laterality: N/A;   DILITATION & CURRETTAGE/HYSTROSCOPY WITH NOVASURE ABLATION N/A 11/08/2021   Procedure: DILATATION & CURETTAGE/HYSTEROSCOPY WITH NOVASURE ABLATION;  Surgeon: Bobbye Charleston, MD;  Location: [place];  Service: Gynecology;  Laterality: N/A;   EYE SURGERY  09/13/2020   cataract bilateral   GASTRIC ROUX-EN-Y N/A 03/12/2017   Procedure:  LAPAROSCOPIC ROUX-EN-Y GASTRIC BYPASS WITH UPPER ENDOSCOPY;  Surgeon: Kieth Brightly, Arta Bruce, MD;  Location: [facility];  Service: General;  Laterality: N/A;   HEMATOMA EVACUATION  12/09/2021   Procedure: EVACUATION HEMATOMA;  Surgeon: Irene Limbo, MD;  Location: [place];  Service: Plastics;;   I & D EXTREMITY N/A 12/09/2021   Procedure: IRRIGATION AND DEBRIDEMENT ABDOMEN;  Surgeon: Irene Limbo, MD;  Location: [place];  Service: Plastics;  Laterality: N/A;   PANNICULECTOMY N/A 12/08/2021   Procedure: PANNICULECTOMY;  Surgeon: Irene Limbo, MD;  Location: [place];  Service: Plastics;  Laterality: N/A;   TUBAL LIGATION  03/27/2019   UPPER GI ENDOSCOPY  03/12/2017   Procedure: UPPER GI ENDOSCOPY;  Surgeon: Kieth Brightly Arta Bruce, MD;  Location: [facility];  Service: General;;   Patient Active Problem List   Diagnosis Date Noted   S/P panniculectomy 12/09/2021   Iron deficiency anemia 06/26/2021   Vaginal bleeding in pregnancy, third trimester 03/11/2019   GBS bacteriuria 01/22/2019   Pregnancy of unknown anatomic location 08/10/2018   Intertrigo 11/22/2017   S/P gastric bypass 04/04/2017   Morbid obesity ([place]) 03/12/2017   Hypothyroidism 09/11/2016   Allergic rhinitis due to pollen 06/29/2015   Anxiety and depression 06/29/2015   Gastroesophageal reflux disease without esophagitis 06/29/2015   OSA on CPAP 06/29/2015   History of syphilis 05/23/2015   Irregular menstrual cycle 11/12/2013   POLYCYSTIC OVARIAN DISEASE 06/20/2009  REFERRING DIAG: N39.3 (ICD-10-CM) - Stress incontinence (female) (female)  THERAPY DIAG:  Unspecified lack of coordination  Abnormal posture  Muscle weakness (generalized)  Rationale for Evaluation and Treatment Rehabilitation  PERTINENT HISTORY: diabetes, Poorly Controlled, Type 2 Hypothyroidism,   Morbid obesity, Anxiety Depression, GERD,  hypertension, gastric bypass 2018, abdominal and bil adductor skin removal   PRECAUTIONS:  NA  SUBJECTIVE:                                                                                                                                                                                      SUBJECTIVE STATEMENT:  Pt states that she is having rare leakage with coughing/laughing due to the knack. She is having more pressure in lower abdomen.    PAIN:  Are you having pain? Yes: NPRS scale: 4/10 Pain location: low back/abdomen Pain description: pressure Aggravating factors: random Relieving factors: nothing  SUBJECTIVE STATEMENT: "Every time I cough or sneeze I pee on myself". Small leakage reported. Also with laughing, exercising. Also urgency with leakage too. Wears pads but causes rash and sometimes leaves toilet paper at underwear. Pt reports she has had 5 day history of vaginal discharge with clear/blood tinged mucus noted and hasn't told gyn yet.    Fluid intake: Yes: water - 2 small bottles. Coffee in morning and sometimes poweraide. Rarely soda.      PAIN:  Are you having pain? Yes NPRS scale: 8/10 Pain location:  anterior low abdomen and low back at the same time   Pain type: cramping, tight  Pain description: intermittent    Aggravating factors: unsure seems random. Before would have pain prior to periods and sometimes afterward but now no longer having periods.  Relieving factors: resting   PRECAUTIONS: None   WEIGHT BEARING RESTRICTIONS: No   FALLS:  Has patient fallen in last 6 months? 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   PLOF: Independent   PATIENT GOALS: to have less leakage   PERTINENT HISTORY:  diabetes, Poorly Controlled, Type 2 Hypothyroidism,   Morbid obesity, Anxiety Depression, GERD,  hypertension, gastric bypass 2018, abdominal and bil adductor skin removal    Sexual abuse: No   BOWEL MOVEMENT: Pain with bowel movement: No Type  of bowel movement:Type (B[place] Stool Scale) 4, Frequency daily sometimes 2-3x (largest in morning, others smaller), and Strain No Fully empty rectum: Yes:   Leakage: No Pads: No Fiber supplement: No   URINATION: Pain with urination: No Fully empty bladder: Yes: but sometimes feels like she will go again 30 mins later Stream: Strong  Urgency: Yes:   Frequency: with coffee quicker than 2 hours but not usually, not regularly up at night  Leakage: Urge to void, Coughing, Sneezing, Laughing, Exercise, and Lifting Pads: Yes: sometimes   INTERCOURSE: Pain with intercourse:  not active  no painful    PREGNANCY: Vaginal deliveries 1 Tearing No C-section deliveries 2 Currently pregnant No   PROLAPSE: None     OBJECTIVE:    DIAGNOSTIC FINDINGS:    [id number]   COGNITION: Overall cognitive status: Within functional limits for tasks assessed                          SENSATION: Light touch: Appears intact Proprioception: Appears intact   MUSCLE LENGTH: Bil hamstrings and adductors limited by 50%     POSTURE: rounded shoulders, forward head, and posterior pelvic tilt   PELVIC ALIGNMENT:   LUMBARAROM/PROM:   A/PROM A/PROM  eval  Flexion Decreased by 50% with abdominal pain reported  Extension Decreased by 25%   Right lateral flexion Decreased by 50% with abdominal pain reported  Left lateral flexion Decreased by 50% with abdominal pain reported  Right rotation Decreased by 50% with abdominal pain reported  Left rotation Decreased by 50% with abdominal pain reported   (Blank rows = not tested)   LOWER EXTREMITY ROM:   WFL   LOWER EXTREMITY MMT:   Bil hips 3+/5, knees and ankles 4+/5   PALPATION:   General  TTP throughout midline of abdomen, profoundly tight tissue restrictions throughout midline and low abdominal quadrants and pt has swelling midline with scarring sites post skin removal surgery with transabdominal and bil adductors. Skin changes at scar sites with  darkening and very restricted throughout sites in all directions. Midline lower abdominal quadrant most restricted and swollen with stiffness limiting all mobility over bladder. Bil adductors proximally restricted in all directions due to scar restrictions as well and bilaterally pt has areas of scar sites that are red with white dry skin noted and she reports due to friction.                   External Perineal Exam dryness noted externally. Pt reports she has a lot of skin allergies and doesn't know what pads to use and has been using toilet paper in panties for leakage. Pt's vulva and clitoral hood moving well without restrictions does have some labia minor adhesion noted however no redness and tissue WFL.                              Internal Pelvic Floor pt denied all TTP   Patient confirms identification and approves PT to assess internal pelvic floor and treatment Yes   PELVIC MMT:   MMT eval  Vaginal 4/5, 9s, 9 reps  Internal Anal Sphincter    External Anal Sphincter    Puborectalis    Diastasis Recti Great difficulty contracting core to lift shoulders slightly from table. Heavy valsalva noted with attempt and face reddening. Noted lower abdominal bulge worsening with this however difficulty to feel for separation due to tissue restriction and poor tissue mobility at scar sites.   (Blank rows = not tested)         TONE: WFL   PROLAPSE: Not seen in hooklying    TODAY'S TREATMENT 10/24/22: Manual: Abdominal scar tissue release Negative pressure soft tissue mobilization to lower abdomen Abdominal soft tissue mobilization Scar tissue  mobilization bil adductors/hip flexors Exercises: Bridges Clam shells Supine march    TREATMENT 10/17/22: Manual: Abdominal scar tissue release Negative pressure soft tissue mobilization to lower abdomen Abdominal soft tissue mobilization  Exercises: Bent knee fall out Kneeling hip flexor stretch Bridge Standing adductor stretch                                                                                                                             DATE:   10/10/22 EVAL Examination completed, findings reviewed, pt educated on POC and scar mobility handout given and reviewed with PT demonstrating how to complete on pt. Pt motivated to participate in PT and agreeable to attempt recommendations.       PATIENT EDUCATION:  Education details: scar mobility  Person educated: Patient Education method: Explanation, Demonstration, Tactile cues, Verbal cues, and Handouts Education comprehension: verbalized understanding and returned demonstration   HOME EXERCISE PROGRAM: [id number]    ASSESSMENT:   CLINICAL IMPRESSION: Pt made excellent progress over the last week with very minimal urinary incontinence utilizing the knack. Today scar tissue mobilization and soft tissue work focused on abdomen and bil adductors/hip flexors with good tolerance. Believe there is less swelling in lower abdomen/mons pubis area compared to last week. Good tolerance to all exercises for strengthening and mobility today. She will continue to benefit from skilled PT intervention in order to reduce urinary incontinence and improve low back/abdominal pain.    OBJECTIVE IMPAIRMENTS: decreased coordination, decreased endurance, decreased mobility, decreased strength, increased fascial restrictions, increased muscle spasms, impaired flexibility, improper body mechanics, postural dysfunction, and pain.    ACTIVITY LIMITATIONS: carrying, lifting, squatting, continence, and locomotion level   PARTICIPATION LIMITATIONS: community activity   PERSONAL FACTORS: Fitness, Time since onset of injury/illness/exacerbation, and 1 comorbidity: medical history   are also affecting patient's functional outcome.    REHAB POTENTIAL: Good   CLINICAL DECISION MAKING: Stable/uncomplicated   EVALUATION COMPLEXITY: Low     GOALS: Goals reviewed with patient? Yes   SHORT TERM  GOALS: Target date: 10/07/22 - updated 10/24/22   Pt to be I with HEP.  Baseline: Goal status: INITIAL   2.  Pt to be I with scar mobility and diaphragmatic breathing for improved abdominal mobility and decreased tissue restrictions.  Baseline:  Goal status: INITIAL   3.  Pt to demonstrate improved coordination with pelvic floor and breathing mechanics with body weight squats for pelvic stability and decreased leakage.  Baseline:  Goal status: INITIAL     LONG TERM GOALS: Target date: 01/09/23   Pt to be I with advanced HEP.  Baseline:  Goal status: INITIAL   2.  Pt to demonstrate improved coordination with pelvic floor and breathing mechanics with 20#  squats for pelvic stability and decreased leakage.  Baseline:  Goal status: INITIAL   3.  Pt to report no more than 1 instance of urinary leakage per month for improved QOL and skin integrity.  Baseline:  Goal status: INITIAL  4.  Pt will have 50% less urgency due to bladder retraining and strengthening  Baseline:  Goal status: INITIAL   5.  Pt to report improved time between bladder voids to at least 2.5 hours with increase in fluid intake for improved QOL with decreased urinary frequency.   Baseline:  Goal status: INITIAL   6.  Pt to demonstrate improved thoracic and lumbar mobility to [facility] of ROM for improved mobility at spine and allow more mobility at abdomen.  Baseline:  Goal status: INITIAL   PLAN:   PT FREQUENCY: 2x/week   PT DURATION:  16 sessions   PLANNED INTERVENTIONS: Therapeutic exercises, Therapeutic activity, Neuromuscular re-education, Patient/Family education, Self Care, Joint mobilization, Aquatic Therapy, Dry Needling, Electrical stimulation, Spinal mobilization, Cryotherapy, Moist heat, scar mobilization, Taping, Traction, Biofeedback, and Manual therapy   PLAN FOR NEXT SESSION: Possible internal exam if needed; continue manual techniques; progress strengthening and mobility.    Heather Roberts,  PT, DPT12/27/234:48 PM

## 2022-11-07 ENCOUNTER — Ambulatory Visit: Payer: Medicaid Other | Attending: Obstetrics and Gynecology

## 2022-11-07 DIAGNOSIS — R279 Unspecified lack of coordination: Secondary | ICD-10-CM | POA: Diagnosis not present

## 2022-11-07 DIAGNOSIS — M6281 Muscle weakness (generalized): Secondary | ICD-10-CM

## 2022-11-07 DIAGNOSIS — R293 Abnormal posture: Secondary | ICD-10-CM | POA: Diagnosis present

## 2022-11-07 DIAGNOSIS — M62838 Other muscle spasm: Secondary | ICD-10-CM

## 2022-11-07 NOTE — Therapy (Signed)
OUTPATIENT PHYSICAL THERAPY TREATMENT NOTE   Patient Name: [name] MRN: [id number] DOB:13-Oct-1976, 47 y.o., female Today's Date: 11/07/2022  PCP: Wardell Honour, MD REFERRING PROVIDER: Bobbye Charleston, MD   END OF SESSION:   PT End of Session - 11/07/22 1615     Visit Number 4    Date for PT Re-Evaluation 01/09/23    Authorization Type Medicaid  [employer]    PT Start Time 1615    PT Stop Time 1655    PT Time Calculation (min) 40 min    Activity Tolerance Patient tolerated treatment well    Behavior During Therapy [facility] for tasks assessed/performed              Past Medical History:  Diagnosis Date   Anemia    Carpal tunnel syndrome, bilateral    Chronic hypertension    Depression    GERD (gastroesophageal reflux disease)    Gestational diabetes    H. pylori infection    dx  09-02-2014-- treated w/ antibiotic   Hypertension    recently low pressures    Hypothyroid    Irregular menstrual cycle    Menorrhagia    PCOS (polycystic ovarian syndrome)    Type 2 diabetes mellitus ([place])    Past Surgical History:  Procedure Laterality Date   CARPAL TUNNEL RELEASE Bilateral 10/07/2014   Procedure: CARPAL TUNNEL RELEASE BILATERAL;  Surgeon: [name], MD;  Location: [place];  Service: Orthopedics;  Laterality: Bilateral;   CESAREAN SECTION  10/29/1993   CESAREAN SECTION WITH BILATERAL TUBAL LIGATION N/A 03/27/2019   Procedure: CESAREAN SECTION WITH BILATERAL TUBAL LIGATION;  Surgeon: Bobbye Charleston, MD;  Location: [place] [facility];  Service: Obstetrics;  Laterality: N/A;   DILITATION & CURRETTAGE/HYSTROSCOPY WITH NOVASURE ABLATION N/A 11/08/2021   Procedure: DILATATION & CURETTAGE/HYSTEROSCOPY WITH NOVASURE ABLATION;  Surgeon: Bobbye Charleston, MD;  Location: [place];  Service: Gynecology;  Laterality: N/A;   EYE SURGERY  09/13/2020   cataract bilateral   GASTRIC ROUX-EN-Y N/A 03/12/2017   Procedure: LAPAROSCOPIC  ROUX-EN-Y GASTRIC BYPASS WITH UPPER ENDOSCOPY;  Surgeon: Kieth Brightly, Arta Bruce, MD;  Location: [facility];  Service: General;  Laterality: N/A;   HEMATOMA EVACUATION  12/09/2021   Procedure: EVACUATION HEMATOMA;  Surgeon: Irene Limbo, MD;  Location: [place];  Service: Plastics;;   I & D EXTREMITY N/A 12/09/2021   Procedure: IRRIGATION AND DEBRIDEMENT ABDOMEN;  Surgeon: Irene Limbo, MD;  Location: [place];  Service: Plastics;  Laterality: N/A;   PANNICULECTOMY N/A 12/08/2021   Procedure: PANNICULECTOMY;  Surgeon: Irene Limbo, MD;  Location: [place];  Service: Plastics;  Laterality: N/A;   TUBAL LIGATION  03/27/2019   UPPER GI ENDOSCOPY  03/12/2017   Procedure: UPPER GI ENDOSCOPY;  Surgeon: Kieth Brightly Arta Bruce, MD;  Location: [facility];  Service: General;;   Patient Active Problem List   Diagnosis Date Noted   S/P panniculectomy 12/09/2021   Iron deficiency anemia 06/26/2021   Vaginal bleeding in pregnancy, third trimester 03/11/2019   GBS bacteriuria 01/22/2019   Pregnancy of unknown anatomic location 08/10/2018   Intertrigo 11/22/2017   S/P gastric bypass 04/04/2017   Morbid obesity ([place]) 03/12/2017   Hypothyroidism 09/11/2016   Allergic rhinitis due to pollen 06/29/2015   Anxiety and depression 06/29/2015   Gastroesophageal reflux disease without esophagitis 06/29/2015   OSA on CPAP 06/29/2015   History of syphilis 05/23/2015   Irregular menstrual cycle 11/12/2013   POLYCYSTIC OVARIAN DISEASE 06/20/2009  REFERRING DIAG: N39.3 (ICD-10-CM) - Stress incontinence (female) (female)  THERAPY DIAG:  Unspecified lack of coordination  Abnormal posture  Muscle weakness (generalized)  Other muscle spasm  Rationale for Evaluation and Treatment Rehabilitation  PERTINENT HISTORY: diabetes, Poorly Controlled, Type 2 Hypothyroidism,   Morbid obesity, Anxiety Depression, GERD,  hypertension, gastric bypass 2018, abdominal and bil adductor skin removal    PRECAUTIONS: NA  SUBJECTIVE:                                                                                                                                                                                      SUBJECTIVE STATEMENT:  Pt states that she had abnormal period for her with heavy bleeding, which she had ablation to help prevent. If she contracts pelvic floor before coughing/sneezing, she normally does not have any leakage. She feels 80% better from when she first came in. She is not having any groin/lower abdominal pain. When she wakes up she will feel a little pressure in lower abdomen. She denies feeling bulge in the vagina.     PAIN:  Are you having pain? Yes: NPRS scale: 0/10 Pain location: low back/abdomen Pain description: pressure Aggravating factors: random Relieving factors: nothing  SUBJECTIVE STATEMENT: "Every time I cough or sneeze I pee on myself". Small leakage reported. Also with laughing, exercising. Also urgency with leakage too. Wears pads but causes rash and sometimes leaves toilet paper at underwear. Pt reports she has had 5 day history of vaginal discharge with clear/blood tinged mucus noted and hasn't told gyn yet.    Fluid intake: Yes: water - 2 small bottles. Coffee in morning and sometimes poweraide. Rarely soda.      PAIN:  Are you having pain? Yes NPRS scale: 8/10 Pain location:  anterior low abdomen and low back at the same time   Pain type: cramping, tight  Pain description: intermittent    Aggravating factors: unsure seems random. Before would have pain prior to periods and sometimes afterward but now no longer having periods.  Relieving factors: resting   PRECAUTIONS: None   WEIGHT BEARING RESTRICTIONS: No   FALLS:  Has patient fallen in last 6 months? 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   PLOF: Independent   PATIENT  GOALS: to have less leakage   PERTINENT HISTORY:  diabetes, Poorly Controlled, Type 2 Hypothyroidism,   Morbid obesity, Anxiety Depression, GERD,  hypertension, gastric bypass 2018, abdominal and bil adductor skin removal    Sexual abuse: No   BOWEL MOVEMENT: Pain with bowel movement: No Type of bowel  movement:Type (Bristol Stool Scale) 4, Frequency daily sometimes 2-3x (largest in morning, others smaller), and Strain No Fully empty rectum: Yes:   Leakage: No Pads: No Fiber supplement: No   URINATION: Pain with urination: No Fully empty bladder: Yes: but sometimes feels like she will go again 30 mins later Stream: Strong Urgency: Yes:   Frequency: with coffee quicker than 2 hours but not usually, not regularly up at night  Leakage: Urge to void, Coughing, Sneezing, Laughing, Exercise, and Lifting Pads: Yes: sometimes   INTERCOURSE: Pain with intercourse:  not active  no painful    PREGNANCY: Vaginal deliveries 1 Tearing No C-section deliveries 2 Currently pregnant No   PROLAPSE: None     OBJECTIVE:    DIAGNOSTIC FINDINGS:    [id number]   COGNITION: Overall cognitive status: Within functional limits for tasks assessed                          SENSATION: Light touch: Appears intact Proprioception: Appears intact   MUSCLE LENGTH: Bil hamstrings and adductors limited by 50%     POSTURE: rounded shoulders, forward head, and posterior pelvic tilt   PELVIC ALIGNMENT:   LUMBARAROM/PROM:   A/PROM A/PROM  eval  Flexion Decreased by 50% with abdominal pain reported  Extension Decreased by 25%   Right lateral flexion Decreased by 50% with abdominal pain reported  Left lateral flexion Decreased by 50% with abdominal pain reported  Right rotation Decreased by 50% with abdominal pain reported  Left rotation Decreased by 50% with abdominal pain reported   (Blank rows = not tested)   LOWER EXTREMITY ROM:   WFL   LOWER EXTREMITY MMT:   Bil hips 3+/5, knees and  ankles 4+/5   PALPATION:   General  TTP throughout midline of abdomen, profoundly tight tissue restrictions throughout midline and low abdominal quadrants and pt has swelling midline with scarring sites post skin removal surgery with transabdominal and bil adductors. Skin changes at scar sites with darkening and very restricted throughout sites in all directions. Midline lower abdominal quadrant most restricted and swollen with stiffness limiting all mobility over bladder. Bil adductors proximally restricted in all directions due to scar restrictions as well and bilaterally pt has areas of scar sites that are red with white dry skin noted and she reports due to friction.                   External Perineal Exam dryness noted externally. Pt reports she has a lot of skin allergies and doesn't know what pads to use and has been using toilet paper in panties for leakage. Pt's vulva and clitoral hood moving well without restrictions does have some labia minor adhesion noted however no redness and tissue WFL.                              Internal Pelvic Floor pt denied all TTP   Patient confirms identification and approves PT to assess internal pelvic floor and treatment Yes   PELVIC MMT:   MMT eval  Vaginal 4/5, 9s, 9 reps  Internal Anal Sphincter    External Anal Sphincter    Puborectalis    Diastasis Recti Great difficulty contracting core to lift shoulders slightly from table. Heavy valsalva noted with attempt and face reddening. Noted lower abdominal bulge worsening with this however difficulty to feel for separation due to tissue restriction and poor  tissue mobility at scar sites.   (Blank rows = not tested)         TONE: WFL   PROLAPSE: Not seen in hooklying    TODAY'S TREATMENT 11/07/22 Exercises: Open books 10x bil Bridge with ball squeeze Straight leg raise 10x bil Sidestepping red band 2 laps Single arm rows 2 x 10 green band  Bil UE extension 2 x 10 Pallof press 10x bil green  band Swiss ball mobility activities: circles, lateral glides, pelvic tilts    TREATMENT 10/24/22: Manual: Abdominal scar tissue release Negative pressure soft tissue mobilization to lower abdomen Abdominal soft tissue mobilization Scar tissue mobilization bil adductors/hip flexors Exercises: Bridges Clam shells Supine march    TREATMENT 10/17/22: Manual: Abdominal scar tissue release Negative pressure soft tissue mobilization to lower abdomen Abdominal soft tissue mobilization  Exercises: Bent knee fall out Kneeling hip flexor stretch Bridge Standing adductor stretch    PATIENT EDUCATION:  Education details: scar mobility  Person educated: Patient Education method: Consulting civil engineer, Media planner, Corporate treasurer cues, Verbal cues, and Handouts Education comprehension: verbalized understanding and returned demonstration   HOME EXERCISE PROGRAM: [email or web address]    ASSESSMENT:   CLINICAL IMPRESSION: Pt is doing very well with report that she is feeling 80% better since starting PT. She is having very little urinary incontinence and abdominal/leg pain is completely reduced. Due to heaviness in lower abdomen, possible vaginal wall laxity present; will consider assessment if symptoms do not improve in next several visits. She did very well with all strengthening progressions with no increase in pain; she does have tendency to hold breath, but did better with verbal cues. She will continue to benefit from skilled PT intervention in order to reduce urinary incontinence and improve low back/abdominal pain.    OBJECTIVE IMPAIRMENTS: decreased coordination, decreased endurance, decreased mobility, decreased strength, increased fascial restrictions, increased muscle spasms, impaired flexibility, improper body mechanics, postural dysfunction, and pain.    ACTIVITY LIMITATIONS: carrying, lifting, squatting, continence, and locomotion level   PARTICIPATION LIMITATIONS: community activity   PERSONAL  FACTORS: Fitness, Time since onset of injury/illness/exacerbation, and 1 comorbidity: medical history   are also affecting patient's functional outcome.    REHAB POTENTIAL: Good   CLINICAL DECISION MAKING: Stable/uncomplicated   EVALUATION COMPLEXITY: Low     GOALS: Goals reviewed with patient? Yes   SHORT TERM GOALS: Target date: 10/07/22 - updated 10/24/22   Pt to be I with HEP.  Baseline: Goal status: INITIAL   2.  Pt to be I with scar mobility and diaphragmatic breathing for improved abdominal mobility and decreased tissue restrictions.  Baseline:  Goal status: INITIAL   3.  Pt to demonstrate improved coordination with pelvic floor and breathing mechanics with body weight squats for pelvic stability and decreased leakage.  Baseline:  Goal status: INITIAL     LONG TERM GOALS: Target date: 01/09/23   Pt to be I with advanced HEP.  Baseline:  Goal status: INITIAL   2.  Pt to demonstrate improved coordination with pelvic floor and breathing mechanics with 20#  squats for pelvic stability and decreased leakage.  Baseline:  Goal status: INITIAL   3.  Pt to report no more than 1 instance of urinary leakage per month for improved QOL and skin integrity.  Baseline:  Goal status: INITIAL   4.  Pt will have 50% less urgency due to bladder retraining and strengthening  Baseline:  Goal status: INITIAL   5.  Pt to report improved time between bladder voids to at least  2.5 hours with increase in fluid intake for improved QOL with decreased urinary frequency.   Baseline:  Goal status: INITIAL   6.  Pt to demonstrate improved thoracic and lumbar mobility to [facility] of ROM for improved mobility at spine and allow more mobility at abdomen.  Baseline:  Goal status: INITIAL   PLAN:   PT FREQUENCY: 2x/week   PT DURATION:  16 sessions   PLANNED INTERVENTIONS: Therapeutic exercises, Therapeutic activity, Neuromuscular re-education, Patient/Family education, Self Care, Joint  mobilization, Aquatic Therapy, Dry Needling, Electrical stimulation, Spinal mobilization, Cryotherapy, Moist heat, scar mobilization, Taping, Traction, Biofeedback, and Manual therapy   PLAN FOR NEXT SESSION: Possible internal exam if needed; continue manual techniques; progress strengthening and mobility.    Heather Roberts, PT, DPT01/10/245:04 PM

## 2022-11-12 ENCOUNTER — Ambulatory Visit: Payer: Medicaid Other

## 2022-11-12 DIAGNOSIS — M6281 Muscle weakness (generalized): Secondary | ICD-10-CM

## 2022-11-12 DIAGNOSIS — R279 Unspecified lack of coordination: Secondary | ICD-10-CM | POA: Diagnosis not present

## 2022-11-12 DIAGNOSIS — R293 Abnormal posture: Secondary | ICD-10-CM

## 2022-11-12 DIAGNOSIS — M62838 Other muscle spasm: Secondary | ICD-10-CM

## 2022-11-12 NOTE — Therapy (Signed)
OUTPATIENT PHYSICAL THERAPY TREATMENT NOTE   Patient Name: [name] MRN: [id number] DOB:1976/10/05, 47 y.o., female Today's Date: 11/12/2022  PCP: Wardell Honour, MD REFERRING PROVIDER: Bobbye Charleston, MD   END OF SESSION:   PT End of Session - 11/12/22 1102     Visit Number 5    Date for PT Re-Evaluation 01/09/23    Authorization Type Medicaid  [employer]    PT Start Time 1101    PT Stop Time 1140    PT Time Calculation (min) 39 min    Activity Tolerance Patient tolerated treatment well    Behavior During Therapy WFL for tasks assessed/performed               Past Medical History:  Diagnosis Date   Anemia    Carpal tunnel syndrome, bilateral    Chronic hypertension    Depression    GERD (gastroesophageal reflux disease)    Gestational diabetes    H. pylori infection    dx  09-02-2014-- treated w/ antibiotic   Hypertension    recently low pressures    Hypothyroid    Irregular menstrual cycle    Menorrhagia    PCOS (polycystic ovarian syndrome)    Type 2 diabetes mellitus ([place])    Past Surgical History:  Procedure Laterality Date   CARPAL TUNNEL RELEASE Bilateral 10/07/2014   Procedure: CARPAL TUNNEL RELEASE BILATERAL;  Surgeon: [name], MD;  Location: [facility];  Service: Orthopedics;  Laterality: Bilateral;   CESAREAN SECTION  10/29/1993   CESAREAN SECTION WITH BILATERAL TUBAL LIGATION N/A 03/27/2019   Procedure: CESAREAN SECTION WITH BILATERAL TUBAL LIGATION;  Surgeon: Bobbye Charleston, MD;  Location: [facility];  Service: Obstetrics;  Laterality: N/A;   DILITATION & CURRETTAGE/HYSTROSCOPY WITH NOVASURE ABLATION N/A 11/08/2021   Procedure: DILATATION & CURETTAGE/HYSTEROSCOPY WITH NOVASURE ABLATION;  Surgeon: Bobbye Charleston, MD;  Location: [facility];  Service: Gynecology;  Laterality: N/A;   EYE SURGERY  09/13/2020   cataract bilateral   GASTRIC ROUX-EN-Y N/A 03/12/2017   Procedure:  LAPAROSCOPIC ROUX-EN-Y GASTRIC BYPASS WITH UPPER ENDOSCOPY;  Surgeon: Kieth Brightly, Arta Bruce, MD;  Location: [facility];  Service: General;  Laterality: N/A;   HEMATOMA EVACUATION  12/09/2021   Procedure: EVACUATION HEMATOMA;  Surgeon: Irene Limbo, MD;  Location: [place] [facility];  Service: Plastics;;   I & D EXTREMITY N/A 12/09/2021   Procedure: IRRIGATION AND DEBRIDEMENT ABDOMEN;  Surgeon: Irene Limbo, MD;  Location: [facility];  Service: Plastics;  Laterality: N/A;   PANNICULECTOMY N/A 12/08/2021   Procedure: PANNICULECTOMY;  Surgeon: Irene Limbo, MD;  Location: [facility];  Service: Plastics;  Laterality: N/A;   TUBAL LIGATION  03/27/2019   UPPER GI ENDOSCOPY  03/12/2017   Procedure: UPPER GI ENDOSCOPY;  Surgeon: Kieth Brightly Arta Bruce, MD;  Location: [facility];  Service: General;;   Patient Active Problem List   Diagnosis Date Noted   S/P panniculectomy 12/09/2021   Iron deficiency anemia 06/26/2021   Vaginal bleeding in pregnancy, third trimester 03/11/2019   GBS bacteriuria 01/22/2019   Pregnancy of unknown anatomic location 08/10/2018   Intertrigo 11/22/2017   S/P gastric bypass 04/04/2017   Morbid obesity ([place]) 03/12/2017   Hypothyroidism 09/11/2016   Allergic rhinitis due to pollen 06/29/2015   Anxiety and depression 06/29/2015   Gastroesophageal reflux disease without esophagitis 06/29/2015   OSA on CPAP 06/29/2015   History of syphilis 05/23/2015   Irregular menstrual cycle 11/12/2013   POLYCYSTIC OVARIAN DISEASE 06/20/2009  REFERRING DIAG: N39.3 (ICD-10-CM) - Stress incontinence (female) (female)  THERAPY DIAG:  Unspecified lack of coordination  Abnormal posture  Muscle weakness (generalized)  Other muscle spasm  Rationale for Evaluation and Treatment Rehabilitation  PERTINENT HISTORY: diabetes, Poorly Controlled, Type 2 Hypothyroidism,   Morbid obesity, Anxiety Depression, GERD,  hypertension, gastric bypass 2018, abdominal and bil adductor skin  removal   PRECAUTIONS: NA  SUBJECTIVE:                                                                                                                                                                                      SUBJECTIVE STATEMENT:  Pt states that she has more swelling and pressure in lower abdomen  in the morning. She still has itching in her skin.    PAIN:  Are you having pain? Yes: NPRS scale: 0/10 Pain location: low back/abdomen Pain description: pressure Aggravating factors: random Relieving factors: nothing  SUBJECTIVE STATEMENT: "Every time I cough or sneeze I pee on myself". Small leakage reported. Also with laughing, exercising. Also urgency with leakage too. Wears pads but causes rash and sometimes leaves toilet paper at underwear. Pt reports she has had 5 day history of vaginal discharge with clear/blood tinged mucus noted and hasn't told gyn yet.    Fluid intake: Yes: water - 2 small bottles. Coffee in morning and sometimes poweraide. Rarely soda.      PAIN:  Are you having pain? Yes NPRS scale: 8/10 Pain location:  anterior low abdomen and low back at the same time   Pain type: cramping, tight  Pain description: intermittent    Aggravating factors: unsure seems random. Before would have pain prior to periods and sometimes afterward but now no longer having periods.  Relieving factors: resting   PRECAUTIONS: None   WEIGHT BEARING RESTRICTIONS: No   FALLS:  Has patient fallen in last 6 months? 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   PLOF: Independent   PATIENT GOALS: to have less leakage   PERTINENT HISTORY:  diabetes, Poorly Controlled, Type 2 Hypothyroidism,   Morbid obesity, Anxiety Depression, GERD,  hypertension, gastric bypass 2018, abdominal and bil adductor skin removal    Sexual abuse: No   BOWEL MOVEMENT: Pain with bowel  movement: No Type of bowel movement:Type (Bristol Stool Scale) 4, Frequency daily sometimes 2-3x (largest in morning, others smaller), and Strain No Fully empty rectum: Yes:   Leakage: No Pads: No Fiber supplement: No   URINATION: Pain with urination: No Fully empty bladder: Yes: but sometimes feels like she will go again  30 mins later Stream: Strong Urgency: Yes:   Frequency: with coffee quicker than 2 hours but not usually, not regularly up at night  Leakage: Urge to void, Coughing, Sneezing, Laughing, Exercise, and Lifting Pads: Yes: sometimes   INTERCOURSE: Pain with intercourse:  not active  no painful    PREGNANCY: Vaginal deliveries 1 Tearing No C-section deliveries 2 Currently pregnant No   PROLAPSE: None     OBJECTIVE:    DIAGNOSTIC FINDINGS:    [id number]   COGNITION: Overall cognitive status: Within functional limits for tasks assessed                          SENSATION: Light touch: Appears intact Proprioception: Appears intact   MUSCLE LENGTH: Bil hamstrings and adductors limited by 50%     POSTURE: rounded shoulders, forward head, and posterior pelvic tilt   PELVIC ALIGNMENT:   LUMBARAROM/PROM:   A/PROM A/PROM  eval  Flexion Decreased by 50% with abdominal pain reported  Extension Decreased by 25%   Right lateral flexion Decreased by 50% with abdominal pain reported  Left lateral flexion Decreased by 50% with abdominal pain reported  Right rotation Decreased by 50% with abdominal pain reported  Left rotation Decreased by 50% with abdominal pain reported   (Blank rows = not tested)   LOWER EXTREMITY ROM:   WFL   LOWER EXTREMITY MMT:   Bil hips 3+/5, knees and ankles 4+/5   PALPATION:   General  TTP throughout midline of abdomen, profoundly tight tissue restrictions throughout midline and low abdominal quadrants and pt has swelling midline with scarring sites post skin removal surgery with transabdominal and bil adductors. Skin changes  at scar sites with darkening and very restricted throughout sites in all directions. Midline lower abdominal quadrant most restricted and swollen with stiffness limiting all mobility over bladder. Bil adductors proximally restricted in all directions due to scar restrictions as well and bilaterally pt has areas of scar sites that are red with white dry skin noted and she reports due to friction.                   External Perineal Exam dryness noted externally. Pt reports she has a lot of skin allergies and doesn't know what pads to use and has been using toilet paper in panties for leakage. Pt's vulva and clitoral hood moving well without restrictions does have some labia minor adhesion noted however no redness and tissue WFL.                              Internal Pelvic Floor pt denied all TTP   Patient confirms identification and approves PT to assess internal pelvic floor and treatment Yes   PELVIC MMT:   MMT eval  Vaginal 4/5, 9s, 9 reps  Internal Anal Sphincter    External Anal Sphincter    Puborectalis    Diastasis Recti Great difficulty contracting core to lift shoulders slightly from table. Heavy valsalva noted with attempt and face reddening. Noted lower abdominal bulge worsening with this however difficulty to feel for separation due to tissue restriction and poor tissue mobility at scar sites.   (Blank rows = not tested)         TONE: WFL   PROLAPSE: Not seen in hooklying    TODAY'S TREATMENT 11/12/22 Manual: Bil adductor scar tissue mobilization Abdominal scar tissue mobilization Exercises: Supine butterfly 5  x 20 seconds Seated hip IR/ER stretch 5x bil Standing adductor stretch 5x bil    TREATMENT 11/07/22 Exercises: Open books 10x bil Bridge with ball squeeze Straight leg raise 10x bil Sidestepping red band 2 laps Single arm rows 2 x 10 green band  Bil UE extension 2 x 10 Pallof press 10x bil green band Swiss ball mobility activities: circles, lateral glides,  pelvic tilts    TREATMENT 10/24/22: Manual: Abdominal scar tissue release Negative pressure soft tissue mobilization to lower abdomen Abdominal soft tissue mobilization Scar tissue mobilization bil adductors/hip flexors Exercises: Bridges Clam shells Supine march     PATIENT EDUCATION:  Education details: scar mobility  Person educated: Patient Education method: Consulting civil engineer, Media planner, Corporate treasurer cues, Verbal cues, and Handouts Education comprehension: verbalized understanding and returned demonstration   HOME EXERCISE PROGRAM: [id number]    ASSESSMENT:   CLINICAL IMPRESSION: Pt continues to do well, but had much more restriction and report of tightness in lower abdomen and inner thighs today. Good tolerance to manual techniques with decrease in reported tightness/pressure. We focused on stretches and mobility after manual techniques to further reduce restriction. She had intense stretches with all activities. She will continue to benefit from skilled PT intervention in order to reduce urinary incontinence and improve low back/abdominal pain.    OBJECTIVE IMPAIRMENTS: decreased coordination, decreased endurance, decreased mobility, decreased strength, increased fascial restrictions, increased muscle spasms, impaired flexibility, improper body mechanics, postural dysfunction, and pain.    ACTIVITY LIMITATIONS: carrying, lifting, squatting, continence, and locomotion level   PARTICIPATION LIMITATIONS: community activity   PERSONAL FACTORS: Fitness, Time since onset of injury/illness/exacerbation, and 1 comorbidity: medical history   are also affecting patient's functional outcome.    REHAB POTENTIAL: Good   CLINICAL DECISION MAKING: Stable/uncomplicated   EVALUATION COMPLEXITY: Low     GOALS: Goals reviewed with patient? Yes   SHORT TERM GOALS: Target date: 10/07/22 - updated 10/24/22   Pt to be I with HEP.  Baseline: Goal status: INITIAL   2.  Pt to be I with scar  mobility and diaphragmatic breathing for improved abdominal mobility and decreased tissue restrictions.  Baseline:  Goal status: INITIAL   3.  Pt to demonstrate improved coordination with pelvic floor and breathing mechanics with body weight squats for pelvic stability and decreased leakage.  Baseline:  Goal status: INITIAL     LONG TERM GOALS: Target date: 01/09/23   Pt to be I with advanced HEP.  Baseline:  Goal status: INITIAL   2.  Pt to demonstrate improved coordination with pelvic floor and breathing mechanics with 20#  squats for pelvic stability and decreased leakage.  Baseline:  Goal status: INITIAL   3.  Pt to report no more than 1 instance of urinary leakage per month for improved QOL and skin integrity.  Baseline:  Goal status: INITIAL   4.  Pt will have 50% less urgency due to bladder retraining and strengthening  Baseline:  Goal status: INITIAL   5.  Pt to report improved time between bladder voids to at least 2.5 hours with increase in fluid intake for improved QOL with decreased urinary frequency.   Baseline:  Goal status: INITIAL   6.  Pt to demonstrate improved thoracic and lumbar mobility to [facility] of ROM for improved mobility at spine and allow more mobility at abdomen.  Baseline:  Goal status: INITIAL   PLAN:   PT FREQUENCY: 2x/week   PT DURATION:  16 sessions   PLANNED INTERVENTIONS: Therapeutic exercises, Therapeutic activity, Neuromuscular  re-education, Patient/Family education, Self Care, Joint mobilization, Aquatic Therapy, Dry Needling, Electrical stimulation, Spinal mobilization, Cryotherapy, Moist heat, scar mobilization, Taping, Traction, Biofeedback, and Manual therapy   PLAN FOR NEXT SESSION: Possible internal exam if needed; continue manual techniques; progress strengthening and mobility.    Heather Roberts, PT, DPT01/15/2411:43 AM

## 2022-11-14 ENCOUNTER — Ambulatory Visit: Payer: Medicaid Other

## 2022-11-14 DIAGNOSIS — M6281 Muscle weakness (generalized): Secondary | ICD-10-CM

## 2022-11-14 DIAGNOSIS — M62838 Other muscle spasm: Secondary | ICD-10-CM

## 2022-11-14 DIAGNOSIS — R279 Unspecified lack of coordination: Secondary | ICD-10-CM

## 2022-11-14 DIAGNOSIS — R293 Abnormal posture: Secondary | ICD-10-CM

## 2022-11-14 NOTE — Therapy (Signed)
OUTPATIENT PHYSICAL THERAPY TREATMENT NOTE   Patient Name: [name] MRN: [id number] DOB:Jun 29, 1976, 47 y.o., female Today's Date: 11/14/2022  PCP: Wardell Honour, MD REFERRING PROVIDER: Bobbye Charleston, MD   END OF SESSION:   PT End of Session - 11/14/22 1228     Visit Number 6    Date for PT Re-Evaluation 01/09/23    Authorization Type Medicaid  [employer]    PT Start Time 7829    PT Stop Time 1310    PT Time Calculation (min) 40 min    Activity Tolerance Patient tolerated treatment well    Behavior During Therapy [facility] for tasks assessed/performed               Past Medical History:  Diagnosis Date   Anemia    Carpal tunnel syndrome, bilateral    Chronic hypertension    Depression    GERD (gastroesophageal reflux disease)    Gestational diabetes    H. pylori infection    dx  09-02-2014-- treated w/ antibiotic   Hypertension    recently low pressures    Hypothyroid    Irregular menstrual cycle    Menorrhagia    PCOS (polycystic ovarian syndrome)    Type 2 diabetes mellitus ([place])    Past Surgical History:  Procedure Laterality Date   CARPAL TUNNEL RELEASE Bilateral 10/07/2014   Procedure: CARPAL TUNNEL RELEASE BILATERAL;  Surgeon: [name], MD;  Location: [place];  Service: Orthopedics;  Laterality: Bilateral;   CESAREAN SECTION  10/29/1993   CESAREAN SECTION WITH BILATERAL TUBAL LIGATION N/A 03/27/2019   Procedure: CESAREAN SECTION WITH BILATERAL TUBAL LIGATION;  Surgeon: Bobbye Charleston, MD;  Location: [place] [facility];  Service: Obstetrics;  Laterality: N/A;   DILITATION & CURRETTAGE/HYSTROSCOPY WITH NOVASURE ABLATION N/A 11/08/2021   Procedure: DILATATION & CURETTAGE/HYSTEROSCOPY WITH NOVASURE ABLATION;  Surgeon: Bobbye Charleston, MD;  Location: [place];  Service: Gynecology;  Laterality: N/A;   EYE SURGERY  09/13/2020   cataract bilateral   GASTRIC ROUX-EN-Y N/A 03/12/2017   Procedure:  LAPAROSCOPIC ROUX-EN-Y GASTRIC BYPASS WITH UPPER ENDOSCOPY;  Surgeon: Kieth Brightly, Arta Bruce, MD;  Location: [facility];  Service: General;  Laterality: N/A;   HEMATOMA EVACUATION  12/09/2021   Procedure: EVACUATION HEMATOMA;  Surgeon: Irene Limbo, MD;  Location: [place];  Service: Plastics;;   I & D EXTREMITY N/A 12/09/2021   Procedure: IRRIGATION AND DEBRIDEMENT ABDOMEN;  Surgeon: Irene Limbo, MD;  Location: [place];  Service: Plastics;  Laterality: N/A;   PANNICULECTOMY N/A 12/08/2021   Procedure: PANNICULECTOMY;  Surgeon: Irene Limbo, MD;  Location: [place];  Service: Plastics;  Laterality: N/A;   TUBAL LIGATION  03/27/2019   UPPER GI ENDOSCOPY  03/12/2017   Procedure: UPPER GI ENDOSCOPY;  Surgeon: Kieth Brightly Arta Bruce, MD;  Location: [facility];  Service: General;;   Patient Active Problem List   Diagnosis Date Noted   S/P panniculectomy 12/09/2021   Iron deficiency anemia 06/26/2021   Vaginal bleeding in pregnancy, third trimester 03/11/2019   GBS bacteriuria 01/22/2019   Pregnancy of unknown anatomic location 08/10/2018   Intertrigo 11/22/2017   S/P gastric bypass 04/04/2017   Morbid obesity ([place]) 03/12/2017   Hypothyroidism 09/11/2016   Allergic rhinitis due to pollen 06/29/2015   Anxiety and depression 06/29/2015   Gastroesophageal reflux disease without esophagitis 06/29/2015   OSA on CPAP 06/29/2015   History of syphilis 05/23/2015   Irregular menstrual cycle 11/12/2013   POLYCYSTIC OVARIAN DISEASE 06/20/2009  REFERRING DIAG: N39.3 (ICD-10-CM) - Stress incontinence (female) (female)  THERAPY DIAG:  Unspecified lack of coordination  Abnormal posture  Muscle weakness (generalized)  Other muscle spasm  Rationale for Evaluation and Treatment Rehabilitation  PERTINENT HISTORY: diabetes, Poorly Controlled, Type 2 Hypothyroidism,   Morbid obesity, Anxiety Depression, GERD,  hypertension, gastric bypass 2018, abdominal and bil adductor skin  removal   PRECAUTIONS: NA  SUBJECTIVE:                                                                                                                                                                                      SUBJECTIVE STATEMENT:  Pt states that she was sore after last treatment session, but is feeling much better now. She is dong well with exercises at home.    PAIN:  Are you having pain? Yes: NPRS scale: 0/10 Pain location: low back/abdomen Pain description: pressure Aggravating factors: random Relieving factors: nothing  SUBJECTIVE STATEMENT: "Every time I cough or sneeze I pee on myself". Small leakage reported. Also with laughing, exercising. Also urgency with leakage too. Wears pads but causes rash and sometimes leaves toilet paper at underwear. Pt reports she has had 5 day history of vaginal discharge with clear/blood tinged mucus noted and hasn't told gyn yet.    Fluid intake: Yes: water - 2 small bottles. Coffee in morning and sometimes poweraide. Rarely soda.      PAIN:  Are you having pain? Yes NPRS scale: 8/10 Pain location:  anterior low abdomen and low back at the same time   Pain type: cramping, tight  Pain description: intermittent    Aggravating factors: unsure seems random. Before would have pain prior to periods and sometimes afterward but now no longer having periods.  Relieving factors: resting   PRECAUTIONS: None   WEIGHT BEARING RESTRICTIONS: No   FALLS:  Has patient fallen in last 6 months? 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   PLOF: Independent   PATIENT GOALS: to have less leakage   PERTINENT HISTORY:  diabetes, Poorly Controlled, Type 2 Hypothyroidism,   Morbid obesity, Anxiety Depression, GERD,  hypertension, gastric bypass 2018, abdominal and bil adductor skin removal    Sexual abuse: No   BOWEL MOVEMENT: Pain  with bowel movement: No Type of bowel movement:Type (B[place] Stool Scale) 4, Frequency daily sometimes 2-3x (largest in morning, others smaller), and Strain No Fully empty rectum: Yes:   Leakage: No Pads: No Fiber supplement: No   URINATION: Pain with urination: No Fully empty bladder: Yes: but sometimes feels like she will go  again 30 mins later Stream: Strong Urgency: Yes:   Frequency: with coffee quicker than 2 hours but not usually, not regularly up at night  Leakage: Urge to void, Coughing, Sneezing, Laughing, Exercise, and Lifting Pads: Yes: sometimes   INTERCOURSE: Pain with intercourse:  not active  no painful    PREGNANCY: Vaginal deliveries 1 Tearing No C-section deliveries 2 Currently pregnant No   PROLAPSE: None     OBJECTIVE:    DIAGNOSTIC FINDINGS:    [id number]   COGNITION: Overall cognitive status: Within functional limits for tasks assessed                          SENSATION: Light touch: Appears intact Proprioception: Appears intact   MUSCLE LENGTH: Bil hamstrings and adductors limited by 50%     POSTURE: rounded shoulders, forward head, and posterior pelvic tilt   PELVIC ALIGNMENT:   LUMBARAROM/PROM:   A/PROM A/PROM  eval  Flexion Decreased by 50% with abdominal pain reported  Extension Decreased by 25%   Right lateral flexion Decreased by 50% with abdominal pain reported  Left lateral flexion Decreased by 50% with abdominal pain reported  Right rotation Decreased by 50% with abdominal pain reported  Left rotation Decreased by 50% with abdominal pain reported   (Blank rows = not tested)   LOWER EXTREMITY ROM:   WFL   LOWER EXTREMITY MMT:   Bil hips 3+/5, knees and ankles 4+/5   PALPATION:   General  TTP throughout midline of abdomen, profoundly tight tissue restrictions throughout midline and low abdominal quadrants and pt has swelling midline with scarring sites post skin removal surgery with transabdominal and bil adductors.  Skin changes at scar sites with darkening and very restricted throughout sites in all directions. Midline lower abdominal quadrant most restricted and swollen with stiffness limiting all mobility over bladder. Bil adductors proximally restricted in all directions due to scar restrictions as well and bilaterally pt has areas of scar sites that are red with white dry skin noted and she reports due to friction.                   External Perineal Exam dryness noted externally. Pt reports she has a lot of skin allergies and doesn't know what pads to use and has been using toilet paper in panties for leakage. Pt's vulva and clitoral hood moving well without restrictions does have some labia minor adhesion noted however no redness and tissue WFL.                              Internal Pelvic Floor pt denied all TTP   Patient confirms identification and approves PT to assess internal pelvic floor and treatment Yes   PELVIC MMT:   MMT eval  Vaginal 4/5, 9s, 9 reps  Internal Anal Sphincter    External Anal Sphincter    Puborectalis    Diastasis Recti Great difficulty contracting core to lift shoulders slightly from table. Heavy valsalva noted with attempt and face reddening. Noted lower abdominal bulge worsening with this however difficulty to feel for separation due to tissue restriction and poor tissue mobility at scar sites.   (Blank rows = not tested)         TONE: WFL   PROLAPSE: Not seen in hooklying    TODAY'S TREATMENT 11/14/22  Exercises: Seated forward fold 10 breaths Seated thoracic extensions 10x Seated piriformis  stretch 60 sec bil Standing side body stretch at bar 60 sec bil Therapeutic activities: Heel raises 2 x 10 Lateral lunge 10x bil at bar for mobility and strengthening Rows 2 x 10, blue band with handles Bil UE extension 2 x 10 blue band with handles Deadlift 10lbs 10x   TREATMENT 11/12/22 Manual: Bil adductor scar tissue mobilization Abdominal scar tissue  mobilization Exercises: Supine butterfly 5 x 20 seconds Seated hip IR/ER stretch 5x bil Standing adductor stretch 5x bil    TREATMENT 11/07/22 Exercises: Open books 10x bil Bridge with ball squeeze Straight leg raise 10x bil Sidestepping red band 2 laps Single arm rows 2 x 10 green band  Bil UE extension 2 x 10 Pallof press 10x bil green band Swiss ball mobility activities: circles, lateral glides, pelvic tilts       PATIENT EDUCATION:  Education details: scar mobility  Person educated: Patient Education method: Consulting civil engineer, Demonstration, Tactile cues, Verbal cues, and Handouts Education comprehension: verbalized understanding and returned demonstration   HOME EXERCISE PROGRAM: [email or web address]    ASSESSMENT:   CLINICAL IMPRESSION: Pt did have some soreness after manual techniques/scar tissue mobilization last session, but states that it was not severe and did not last long. She has not noticed any improvement in lower abdominal pressure with techniques so far. Due to this and increasing coccyx pain, we will plan to perform internal pelvic floor exam next treatment session to see if pelvic floor restriction is playing a role in these symptoms. She did very well with all functional mobility and strengthening today. She will continue to benefit from skilled PT intervention in order to reduce urinary incontinence and improve low back/abdominal pain.    OBJECTIVE IMPAIRMENTS: decreased coordination, decreased endurance, decreased mobility, decreased strength, increased fascial restrictions, increased muscle spasms, impaired flexibility, improper body mechanics, postural dysfunction, and pain.    ACTIVITY LIMITATIONS: carrying, lifting, squatting, continence, and locomotion level   PARTICIPATION LIMITATIONS: community activity   PERSONAL FACTORS: Fitness, Time since onset of injury/illness/exacerbation, and 1 comorbidity: medical history   are also affecting patient's functional  outcome.    REHAB POTENTIAL: Good   CLINICAL DECISION MAKING: Stable/uncomplicated   EVALUATION COMPLEXITY: Low     GOALS: Goals reviewed with patient? Yes   SHORT TERM GOALS: Target date: 10/07/22 - updated 10/24/22   Pt to be I with HEP.  Baseline: Goal status:  IN PROGRESS   2.  Pt to be I with scar mobility and diaphragmatic breathing for improved abdominal mobility and decreased tissue restrictions.  Baseline:  Goal status:  IN PROGRESS   3.  Pt to demonstrate improved coordination with pelvic floor and breathing mechanics with body weight squats for pelvic stability and decreased leakage.  Baseline:  Goal status:  IN PROGRESS     LONG TERM GOALS: Target date: 01/09/23   Pt to be I with advanced HEP.  Baseline:  Goal status: IN PROGRESS   2.  Pt to demonstrate improved coordination with pelvic floor and breathing mechanics with 20#  squats for pelvic stability and decreased leakage.  Baseline:  Goal status:  IN PROGRESS   3.  Pt to report no more than 1 instance of urinary leakage per month for improved QOL and skin integrity.  Baseline:  Goal status:  IN PROGRESS   4.  Pt will have 50% less urgency due to bladder retraining and strengthening  Baseline:  Goal status:  IN PROGRESS   5.  Pt to report improved time between bladder voids to  at least 2.5 hours with increase in fluid intake for improved QOL with decreased urinary frequency.   Baseline:  Goal status:  IN PROGRESS   6.  Pt to demonstrate improved thoracic and lumbar mobility to [facility] of ROM for improved mobility at spine and allow more mobility at abdomen.  Baseline:  Goal status:  IN PROGRESS   PLAN:   PT FREQUENCY: 2x/week   PT DURATION:  16 sessions   PLANNED INTERVENTIONS: Therapeutic exercises, Therapeutic activity, Neuromuscular re-education, Patient/Family education, Self Care, Joint mobilization, Aquatic Therapy, Dry Needling, Electrical stimulation, Spinal mobilization, Cryotherapy, Moist  heat, scar mobilization, Taping, Traction, Biofeedback, and Manual therapy   PLAN FOR NEXT SESSION: Possible internal exam if needed; continue manual techniques; progress strengthening and mobility.    Heather Roberts, PT, DPT01/17/241:12 PM

## 2022-11-20 ENCOUNTER — Ambulatory Visit: Payer: Medicaid Other

## 2022-11-20 NOTE — Therapy (Incomplete)
OUTPATIENT PHYSICAL THERAPY TREATMENT NOTE   Patient Name: [name] MRN: [id number] DOB:May 18, 1976, 47 y.o., female Today's Date: 11/20/2022  PCP: Wardell Honour, MD REFERRING PROVIDER: Bobbye Charleston, MD   END OF SESSION:       Past Medical History:  Diagnosis Date   Anemia    Carpal tunnel syndrome, bilateral    Chronic hypertension    Depression    GERD (gastroesophageal reflux disease)    Gestational diabetes    H. pylori infection    dx  09-02-2014-- treated w/ antibiotic   Hypertension    recently low pressures    Hypothyroid    Irregular menstrual cycle    Menorrhagia    PCOS (polycystic ovarian syndrome)    Type 2 diabetes mellitus ([place])    Past Surgical History:  Procedure Laterality Date   CARPAL TUNNEL RELEASE Bilateral 10/07/2014   Procedure: CARPAL TUNNEL RELEASE BILATERAL;  Surgeon: [name], MD;  Location: [place];  Service: Orthopedics;  Laterality: Bilateral;   CESAREAN SECTION  10/29/1993   CESAREAN SECTION WITH BILATERAL TUBAL LIGATION N/A 03/27/2019   Procedure: CESAREAN SECTION WITH BILATERAL TUBAL LIGATION;  Surgeon: Bobbye Charleston, MD;  Location: [facility];  Service: Obstetrics;  Laterality: N/A;   DILITATION & CURRETTAGE/HYSTROSCOPY WITH NOVASURE ABLATION N/A 11/08/2021   Procedure: DILATATION & CURETTAGE/HYSTEROSCOPY WITH NOVASURE ABLATION;  Surgeon: Bobbye Charleston, MD;  Location: [place];  Service: Gynecology;  Laterality: N/A;   EYE SURGERY  09/13/2020   cataract bilateral   GASTRIC ROUX-EN-Y N/A 03/12/2017   Procedure: LAPAROSCOPIC ROUX-EN-Y GASTRIC BYPASS WITH UPPER ENDOSCOPY;  Surgeon: Kieth Brightly, Arta Bruce, MD;  Location: [facility];  Service: General;  Laterality: N/A;   HEMATOMA EVACUATION  12/09/2021   Procedure: EVACUATION HEMATOMA;  Surgeon: Irene Limbo, MD;  Location: [place];  Service: Plastics;;   I & D EXTREMITY N/A 12/09/2021   Procedure: IRRIGATION AND  DEBRIDEMENT ABDOMEN;  Surgeon: Irene Limbo, MD;  Location: [facility];  Service: Plastics;  Laterality: N/A;   PANNICULECTOMY N/A 12/08/2021   Procedure: PANNICULECTOMY;  Surgeon: Irene Limbo, MD;  Location: [place];  Service: Plastics;  Laterality: N/A;   TUBAL LIGATION  03/27/2019   UPPER GI ENDOSCOPY  03/12/2017   Procedure: UPPER GI ENDOSCOPY;  Surgeon: Kieth Brightly Arta Bruce, MD;  Location: [facility];  Service: General;;   Patient Active Problem List   Diagnosis Date Noted   S/P panniculectomy 12/09/2021   Iron deficiency anemia 06/26/2021   Vaginal bleeding in pregnancy, third trimester 03/11/2019   GBS bacteriuria 01/22/2019   Pregnancy of unknown anatomic location 08/10/2018   Intertrigo 11/22/2017   S/P gastric bypass 04/04/2017   Morbid obesity ([place]) 03/12/2017   Hypothyroidism 09/11/2016   Allergic rhinitis due to pollen 06/29/2015   Anxiety and depression 06/29/2015   Gastroesophageal reflux disease without esophagitis 06/29/2015   OSA on CPAP 06/29/2015   History of syphilis 05/23/2015   Irregular menstrual cycle 11/12/2013   POLYCYSTIC OVARIAN DISEASE 06/20/2009    REFERRING DIAG: N39.3 (ICD-10-CM) - Stress incontinence (female) (female)  THERAPY DIAG:  No diagnosis found.  Rationale for Evaluation and Treatment Rehabilitation  PERTINENT HISTORY: diabetes, Poorly Controlled, Type 2 Hypothyroidism,   Morbid obesity, Anxiety Depression, GERD,  hypertension, gastric bypass 2018, abdominal and bil adductor skin removal   PRECAUTIONS: NA  SUBJECTIVE:  SUBJECTIVE STATEMENT:  Pt states that she was sore after last treatment session, but is feeling much better now. She is dong well with exercises at home.    PAIN:  Are you having pain? Yes: NPRS scale: 0/10 Pain  location: low back/abdomen Pain description: pressure Aggravating factors: random Relieving factors: nothing  SUBJECTIVE STATEMENT: "Every time I cough or sneeze I pee on myself". Small leakage reported. Also with laughing, exercising. Also urgency with leakage too. Wears pads but causes rash and sometimes leaves toilet paper at underwear. Pt reports she has had 5 day history of vaginal discharge with clear/blood tinged mucus noted and hasn't told gyn yet.    Fluid intake: Yes: water - 2 small bottles. Coffee in morning and sometimes poweraide. Rarely soda.      PAIN:  Are you having pain? Yes NPRS scale: 8/10 Pain location:  anterior low abdomen and low back at the same time   Pain type: cramping, tight  Pain description: intermittent    Aggravating factors: unsure seems random. Before would have pain prior to periods and sometimes afterward but now no longer having periods.  Relieving factors: resting   PRECAUTIONS: None   WEIGHT BEARING RESTRICTIONS: No   FALLS:  Has patient fallen in last 6 months? 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   PLOF: Independent   PATIENT GOALS: to have less leakage   PERTINENT HISTORY:  diabetes, Poorly Controlled, Type 2 Hypothyroidism,   Morbid obesity, Anxiety Depression, GERD,  hypertension, gastric bypass 2018, abdominal and bil adductor skin removal    Sexual abuse: No   BOWEL MOVEMENT: Pain with bowel movement: No Type of bowel movement:Type (B[place] Stool Scale) 4, Frequency daily sometimes 2-3x (largest in morning, others smaller), and Strain No Fully empty rectum: Yes:   Leakage: No Pads: No Fiber supplement: No   URINATION: Pain with urination: No Fully empty bladder: Yes: but sometimes feels like she will go again 30 mins later Stream: Strong Urgency: Yes:   Frequency: with coffee quicker than 2 hours but not  usually, not regularly up at night  Leakage: Urge to void, Coughing, Sneezing, Laughing, Exercise, and Lifting Pads: Yes: sometimes   INTERCOURSE: Pain with intercourse:  not active  no painful    PREGNANCY: Vaginal deliveries 1 Tearing No C-section deliveries 2 Currently pregnant No   PROLAPSE: None     OBJECTIVE:    DIAGNOSTIC FINDINGS:    [id number]   COGNITION: Overall cognitive status: Within functional limits for tasks assessed                          SENSATION: Light touch: Appears intact Proprioception: Appears intact   MUSCLE LENGTH: Bil hamstrings and adductors limited by 50%     POSTURE: rounded shoulders, forward head, and posterior pelvic tilt   PELVIC ALIGNMENT:   LUMBARAROM/PROM:   A/PROM A/PROM  eval  Flexion Decreased by 50% with abdominal pain reported  Extension Decreased by 25%   Right lateral flexion Decreased by 50% with abdominal pain reported  Left lateral flexion Decreased by 50% with abdominal pain reported  Right rotation Decreased by 50% with abdominal pain reported  Left rotation Decreased by 50% with abdominal pain reported   (Blank rows = not tested)   LOWER EXTREMITY ROM:   WFL   LOWER EXTREMITY MMT:   Bil hips 3+/5,  knees and ankles 4+/5   PALPATION:   General  TTP throughout midline of abdomen, profoundly tight tissue restrictions throughout midline and low abdominal quadrants and pt has swelling midline with scarring sites post skin removal surgery with transabdominal and bil adductors. Skin changes at scar sites with darkening and very restricted throughout sites in all directions. Midline lower abdominal quadrant most restricted and swollen with stiffness limiting all mobility over bladder. Bil adductors proximally restricted in all directions due to scar restrictions as well and bilaterally pt has areas of scar sites that are red with white dry skin noted and she reports due to friction.                   External  Perineal Exam dryness noted externally. Pt reports she has a lot of skin allergies and doesn't know what pads to use and has been using toilet paper in panties for leakage. Pt's vulva and clitoral hood moving well without restrictions does have some labia minor adhesion noted however no redness and tissue WFL.                              Internal Pelvic Floor pt denied all TTP   Patient confirms identification and approves PT to assess internal pelvic floor and treatment Yes   PELVIC MMT:   MMT eval  Vaginal 4/5, 9s, 9 reps  Internal Anal Sphincter    External Anal Sphincter    Puborectalis    Diastasis Recti Great difficulty contracting core to lift shoulders slightly from table. Heavy valsalva noted with attempt and face reddening. Noted lower abdominal bulge worsening with this however difficulty to feel for separation due to tissue restriction and poor tissue mobility at scar sites.   (Blank rows = not tested)         TONE: WFL   PROLAPSE: Not seen in hooklying    TODAY'S TREATMENT 11/20/22 Manual: Pt provides verbal consent for internal vaginal/rectal pelvic floor exam.  Neuromuscular re-education:  Exercises:  Therapeutic activities:    TREATMENT 11/14/22  Exercises: Seated forward fold 10 breaths Seated thoracic extensions 10x Seated piriformis stretch 60 sec bil Standing side body stretch at bar 60 sec bil Therapeutic activities: Heel raises 2 x 10 Lateral lunge 10x bil at bar for mobility and strengthening Rows 2 x 10, blue band with handles Bil UE extension 2 x 10 blue band with handles Deadlift 10lbs 10x   TREATMENT 11/12/22 Manual: Bil adductor scar tissue mobilization Abdominal scar tissue mobilization Exercises: Supine butterfly 5 x 20 seconds Seated hip IR/ER stretch 5x bil Standing adductor stretch 5x bil    TREATMENT 11/07/22 Exercises: Open books 10x bil Bridge with ball squeeze Straight leg raise 10x bil Sidestepping red band 2  laps Single arm rows 2 x 10 green band  Bil UE extension 2 x 10 Pallof press 10x bil green band Swiss ball mobility activities: circles, lateral glides, pelvic tilts       PATIENT EDUCATION:  Education details: scar mobility  Person educated: Patient Education method: Consulting civil engineer, Demonstration, Tactile cues, Verbal cues, and Handouts Education comprehension: verbalized understanding and returned demonstration   HOME EXERCISE PROGRAM: [email or web address]    ASSESSMENT:   CLINICAL IMPRESSION: Due to worsening tailbone pain, internal vaginal/rectal exam performed this treatment session. She will continue to benefit from skilled PT intervention in order to reduce urinary incontinence and improve low back/abdominal pain.    OBJECTIVE IMPAIRMENTS: decreased coordination,  decreased endurance, decreased mobility, decreased strength, increased fascial restrictions, increased muscle spasms, impaired flexibility, improper body mechanics, postural dysfunction, and pain.    ACTIVITY LIMITATIONS: carrying, lifting, squatting, continence, and locomotion level   PARTICIPATION LIMITATIONS: community activity   PERSONAL FACTORS: Fitness, Time since onset of injury/illness/exacerbation, and 1 comorbidity: medical history   are also affecting patient's functional outcome.    REHAB POTENTIAL: Good   CLINICAL DECISION MAKING: Stable/uncomplicated   EVALUATION COMPLEXITY: Low     GOALS: Goals reviewed with patient? Yes   SHORT TERM GOALS: Target date: 10/07/22 - updated 10/24/22   Pt to be I with HEP.  Baseline: Goal status:  IN PROGRESS   2.  Pt to be I with scar mobility and diaphragmatic breathing for improved abdominal mobility and decreased tissue restrictions.  Baseline:  Goal status:  IN PROGRESS   3.  Pt to demonstrate improved coordination with pelvic floor and breathing mechanics with body weight squats for pelvic stability and decreased leakage.  Baseline:  Goal status:  IN PROGRESS      LONG TERM GOALS: Target date: 01/09/23   Pt to be I with advanced HEP.  Baseline:  Goal status: IN PROGRESS   2.  Pt to demonstrate improved coordination with pelvic floor and breathing mechanics with 20#  squats for pelvic stability and decreased leakage.  Baseline:  Goal status:  IN PROGRESS   3.  Pt to report no more than 1 instance of urinary leakage per month for improved QOL and skin integrity.  Baseline:  Goal status:  IN PROGRESS   4.  Pt will have 50% less urgency due to bladder retraining and strengthening  Baseline:  Goal status:  IN PROGRESS   5.  Pt to report improved time between bladder voids to at least 2.5 hours with increase in fluid intake for improved QOL with decreased urinary frequency.   Baseline:  Goal status:  IN PROGRESS   6.  Pt to demonstrate improved thoracic and lumbar mobility to [facility] of ROM for improved mobility at spine and allow more mobility at abdomen.  Baseline:  Goal status:  IN PROGRESS   PLAN:   PT FREQUENCY: 2x/week   PT DURATION:  16 sessions   PLANNED INTERVENTIONS: Therapeutic exercises, Therapeutic activity, Neuromuscular re-education, Patient/Family education, Self Care, Joint mobilization, Aquatic Therapy, Dry Needling, Electrical stimulation, Spinal mobilization, Cryotherapy, Moist heat, scar mobilization, Taping, Traction, Biofeedback, and Manual therapy   PLAN FOR NEXT SESSION: Possible internal exam if needed; continue manual techniques; progress strengthening and mobility.    Heather Roberts, PT, DPT01/23/241:44 PM

## 2022-11-22 ENCOUNTER — Ambulatory Visit: Payer: Medicaid Other

## 2022-11-27 ENCOUNTER — Ambulatory Visit: Payer: Medicaid Other

## 2022-11-27 DIAGNOSIS — R279 Unspecified lack of coordination: Secondary | ICD-10-CM | POA: Diagnosis not present

## 2022-11-27 DIAGNOSIS — M6281 Muscle weakness (generalized): Secondary | ICD-10-CM

## 2022-11-27 DIAGNOSIS — R293 Abnormal posture: Secondary | ICD-10-CM

## 2022-11-27 DIAGNOSIS — M62838 Other muscle spasm: Secondary | ICD-10-CM

## 2022-11-27 NOTE — Therapy (Signed)
OUTPATIENT PHYSICAL THERAPY TREATMENT NOTE   Patient Name: [name] MRN: [id number] DOB:January 31, 1976, 47 y.o., female Today's Date: 11/27/2022  PCP: Wardell Honour, MD REFERRING PROVIDER: Bobbye Charleston, MD   END OF SESSION:   PT End of Session - 11/27/22 0933     Visit Number 7    Date for PT Re-Evaluation 01/09/23    Authorization Type Medicaid  [employer]    PT Start Time 0930    PT Stop Time 1010    PT Time Calculation (min) 40 min    Activity Tolerance Patient tolerated treatment well    Behavior During Therapy [facility] for tasks assessed/performed                Past Medical History:  Diagnosis Date   Anemia    Carpal tunnel syndrome, bilateral    Chronic hypertension    Depression    GERD (gastroesophageal reflux disease)    Gestational diabetes    H. pylori infection    dx  09-02-2014-- treated w/ antibiotic   Hypertension    recently low pressures    Hypothyroid    Irregular menstrual cycle    Menorrhagia    PCOS (polycystic ovarian syndrome)    Type 2 diabetes mellitus ([place])    Past Surgical History:  Procedure Laterality Date   CARPAL TUNNEL RELEASE Bilateral 10/07/2014   Procedure: CARPAL TUNNEL RELEASE BILATERAL;  Surgeon: [name], MD;  Location: [place];  Service: Orthopedics;  Laterality: Bilateral;   CESAREAN SECTION  10/29/1993   CESAREAN SECTION WITH BILATERAL TUBAL LIGATION N/A 03/27/2019   Procedure: CESAREAN SECTION WITH BILATERAL TUBAL LIGATION;  Surgeon: Bobbye Charleston, MD;  Location: [place] [facility];  Service: Obstetrics;  Laterality: N/A;   DILITATION & CURRETTAGE/HYSTROSCOPY WITH NOVASURE ABLATION N/A 11/08/2021   Procedure: DILATATION & CURETTAGE/HYSTEROSCOPY WITH NOVASURE ABLATION;  Surgeon: Bobbye Charleston, MD;  Location: [place];  Service: Gynecology;  Laterality: N/A;   EYE SURGERY  09/13/2020   cataract bilateral   GASTRIC ROUX-EN-Y N/A 03/12/2017   Procedure:  LAPAROSCOPIC ROUX-EN-Y GASTRIC BYPASS WITH UPPER ENDOSCOPY;  Surgeon: Kieth Brightly, Arta Bruce, MD;  Location: [facility];  Service: General;  Laterality: N/A;   HEMATOMA EVACUATION  12/09/2021   Procedure: EVACUATION HEMATOMA;  Surgeon: Irene Limbo, MD;  Location: [place];  Service: Plastics;;   I & D EXTREMITY N/A 12/09/2021   Procedure: IRRIGATION AND DEBRIDEMENT ABDOMEN;  Surgeon: Irene Limbo, MD;  Location: [place];  Service: Plastics;  Laterality: N/A;   PANNICULECTOMY N/A 12/08/2021   Procedure: PANNICULECTOMY;  Surgeon: Irene Limbo, MD;  Location: [place];  Service: Plastics;  Laterality: N/A;   TUBAL LIGATION  03/27/2019   UPPER GI ENDOSCOPY  03/12/2017   Procedure: UPPER GI ENDOSCOPY;  Surgeon: Kieth Brightly Arta Bruce, MD;  Location: [facility];  Service: General;;   Patient Active Problem List   Diagnosis Date Noted   S/P panniculectomy 12/09/2021   Iron deficiency anemia 06/26/2021   Vaginal bleeding in pregnancy, third trimester 03/11/2019   GBS bacteriuria 01/22/2019   Pregnancy of unknown anatomic location 08/10/2018   Intertrigo 11/22/2017   S/P gastric bypass 04/04/2017   Morbid obesity ([place]) 03/12/2017   Hypothyroidism 09/11/2016   Allergic rhinitis due to pollen 06/29/2015   Anxiety and depression 06/29/2015   Gastroesophageal reflux disease without esophagitis 06/29/2015   OSA on CPAP 06/29/2015   History of syphilis 05/23/2015   Irregular menstrual cycle 11/12/2013   POLYCYSTIC OVARIAN DISEASE 06/20/2009  REFERRING DIAG: N39.3 (ICD-10-CM) - Stress incontinence (female) (female)  THERAPY DIAG:  Unspecified lack of coordination  Abnormal posture  Muscle weakness (generalized)  Other muscle spasm  Rationale for Evaluation and Treatment Rehabilitation  PERTINENT HISTORY: diabetes, Poorly Controlled, Type 2 Hypothyroidism,   Morbid obesity, Anxiety Depression, GERD,  hypertension, gastric bypass 2018, abdominal and bil adductor skin  removal   PRECAUTIONS: NA  SUBJECTIVE:                                                                                                                                                                                      SUBJECTIVE STATEMENT:  Pt states that she continues to have tailbone pain that is slowly getting worse, mostly when sitting on hard surface or on the floor. She feels a lot of tightness in the skin of her Rt upper thigh. She feels like the pressure in her abdomen in better; she feels like wearing something tight around her abdomen is helpful. She also notices this heaviness in lower abdomen more when she wakes up in the morning. She reports difficulty with orgasm and decreased vaginal sensation.    PAIN:  Are you having pain? Yes: NPRS scale: 1/10 Pain location: low back/abdomen Pain description: pressure Aggravating factors: random Relieving factors: nothing  SUBJECTIVE STATEMENT: "Every time I cough or sneeze I pee on myself". Small leakage reported. Also with laughing, exercising. Also urgency with leakage too. Wears pads but causes rash and sometimes leaves toilet paper at underwear. Pt reports she has had 5 day history of vaginal discharge with clear/blood tinged mucus noted and hasn't told gyn yet.    Fluid intake: Yes: water - 2 small bottles. Coffee in morning and sometimes poweraide. Rarely soda.      PAIN:  Are you having pain? Yes NPRS scale: 8/10 Pain location:  anterior low abdomen and low back at the same time   Pain type: cramping, tight  Pain description: intermittent    Aggravating factors: unsure seems random. Before would have pain prior to periods and sometimes afterward but now no longer having periods.  Relieving factors: resting   PRECAUTIONS: None   WEIGHT BEARING RESTRICTIONS: No   FALLS:  Has patient fallen in last 6 months? 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   PLOF: Independent   PATIENT GOALS: to have less leakage   PERTINENT HISTORY:  diabetes, Poorly Controlled, Type 2 Hypothyroidism,   Morbid obesity, Anxiety Depression, GERD,  hypertension, gastric bypass 2018, abdominal and bil adductor skin removal    Sexual abuse: No  BOWEL MOVEMENT: Pain with bowel movement: No Type of bowel movement:Type ([place] Stool Scale) 4, Frequency daily sometimes 2-3x (largest in morning, others smaller), and Strain No Fully empty rectum: Yes:   Leakage: No Pads: No Fiber supplement: No   URINATION: Pain with urination: No Fully empty bladder: Yes: but sometimes feels like she will go again 30 mins later Stream: Strong Urgency: Yes:   Frequency: with coffee quicker than 2 hours but not usually, not regularly up at night  Leakage: Urge to void, Coughing, Sneezing, Laughing, Exercise, and Lifting Pads: Yes: sometimes   INTERCOURSE: Pain with intercourse:  not active  no painful    PREGNANCY: Vaginal deliveries 1 Tearing No C-section deliveries 2 Currently pregnant No   PROLAPSE: None     OBJECTIVE:   11/27/22: PELVIC MMT:   MMT eval  Vaginal 4/5, 10 second endurance, 10 repeat contractions  Internal Anal Sphincter    External Anal Sphincter    Puborectalis    Diastasis Recti   (Blank rows = not tested)         TONE: Posterior levator ani restriction/pain   PROLAPSE: Grade 1 anterior vaginal wall laxity  DIAGNOSTIC FINDINGS:    [id number]   COGNITION: Overall cognitive status: Within functional limits for tasks assessed                          SENSATION: Light touch: Appears intact Proprioception: Appears intact   MUSCLE LENGTH: Bil hamstrings and adductors limited by 50%     POSTURE: rounded shoulders, forward head, and posterior pelvic tilt   PELVIC ALIGNMENT:   LUMBARAROM/PROM:   A/PROM A/PROM  eval  Flexion Decreased by 50% with abdominal pain reported  Extension  Decreased by 25%   Right lateral flexion Decreased by 50% with abdominal pain reported  Left lateral flexion Decreased by 50% with abdominal pain reported  Right rotation Decreased by 50% with abdominal pain reported  Left rotation Decreased by 50% with abdominal pain reported   (Blank rows = not tested)   LOWER EXTREMITY ROM:   WFL   LOWER EXTREMITY MMT:   Bil hips 3+/5, knees and ankles 4+/5   PALPATION:   General  TTP throughout midline of abdomen, profoundly tight tissue restrictions throughout midline and low abdominal quadrants and pt has swelling midline with scarring sites post skin removal surgery with transabdominal and bil adductors. Skin changes at scar sites with darkening and very restricted throughout sites in all directions. Midline lower abdominal quadrant most restricted and swollen with stiffness limiting all mobility over bladder. Bil adductors proximally restricted in all directions due to scar restrictions as well and bilaterally pt has areas of scar sites that are red with white dry skin noted and she reports due to friction.                   External Perineal Exam dryness noted externally. Pt reports she has a lot of skin allergies and doesn't know what pads to use and has been using toilet paper in panties for leakage. Pt's vulva and clitoral hood moving well without restrictions does have some labia minor adhesion noted however no redness and tissue WFL.                              Internal Pelvic Floor pt denied all TTP   Patient confirms identification and approves PT to assess internal pelvic  floor and treatment Yes      TODAY'S TREATMENT 11/27/22 Manual: Pt provides verbal consent for internal vaginal/rectal pelvic floor exam. Bil deep levator ani release/coccyx attachment release Rt adductor scar tissue mobilization    TREATMENT 11/14/22  Exercises: Seated forward fold 10 breaths Seated thoracic extensions 10x Seated piriformis stretch 60 sec  bil Standing side body stretch at bar 60 sec bil Therapeutic activities: Heel raises 2 x 10 Lateral lunge 10x bil at bar for mobility and strengthening Rows 2 x 10, blue band with handles Bil UE extension 2 x 10 blue band with handles Deadlift 10lbs 10x   TREATMENT 11/12/22 Manual: Bil adductor scar tissue mobilization Abdominal scar tissue mobilization Exercises: Supine butterfly 5 x 20 seconds Seated hip IR/ER stretch 5x bil Standing adductor stretch 5x bil    TREATMENT 11/07/22 Exercises: Open books 10x bil Bridge with ball squeeze Straight leg raise 10x bil Sidestepping red band 2 laps Single arm rows 2 x 10 green band  Bil UE extension 2 x 10 Pallof press 10x bil green band Swiss ball mobility activities: circles, lateral glides, pelvic tilts       PATIENT EDUCATION:  Education details: scar mobility  Person educated: Patient Education method: Consulting civil engineer, Demonstration, Tactile cues, Verbal cues, and Handouts Education comprehension: verbalized understanding and returned demonstration   HOME EXERCISE PROGRAM: [email or web address]    ASSESSMENT:   CLINICAL IMPRESSION: Due to persistent and worsening tailbone pain, we performed internal vaginal pelvic floor exam today with notable findings of posterior levator ani pain and restriction. She tolerated pelvic floor muscle release well with good release of restriction. She states that she was unable to feel sensation of finger in vagina and it is similar during intercourse with decreased sensation. She could feel deep pressure. We discussed hormone changes, using her prescribed estrogen cream, using vibrator for improved circulation, and continuing pelvic floor release with husband to help increase sensation. She will continue to benefit from skilled PT intervention in order to reduce urinary incontinence and improve low back/abdominal pain.    OBJECTIVE IMPAIRMENTS: decreased coordination, decreased endurance, decreased mobility,  decreased strength, increased fascial restrictions, increased muscle spasms, impaired flexibility, improper body mechanics, postural dysfunction, and pain.    ACTIVITY LIMITATIONS: carrying, lifting, squatting, continence, and locomotion level   PARTICIPATION LIMITATIONS: community activity   PERSONAL FACTORS: Fitness, Time since onset of injury/illness/exacerbation, and 1 comorbidity: medical history   are also affecting patient's functional outcome.    REHAB POTENTIAL: Good   CLINICAL DECISION MAKING: Stable/uncomplicated   EVALUATION COMPLEXITY: Low     GOALS: Goals reviewed with patient? Yes   SHORT TERM GOALS: Target date: 10/07/22 - updated 10/24/22 updated 11/27/22   Pt to be I with HEP.  Baseline: Goal status:  MET 11/27/22   2.  Pt to be I with scar mobility and diaphragmatic breathing for improved abdominal mobility and decreased tissue restrictions.  Baseline:  Goal status:  MET 11/27/22   3.  Pt to demonstrate improved coordination with pelvic floor and breathing mechanics with body weight squats for pelvic stability and decreased leakage.  Baseline:  Goal status:  MET 11/27/22     LONG TERM GOALS: Target date: 01/09/23   Pt to be I with advanced HEP.  Baseline:  Goal status: IN PROGRESS   2.  Pt to demonstrate improved coordination with pelvic floor and breathing mechanics with 20#  squats for pelvic stability and decreased leakage.  Baseline:  Goal status:  IN PROGRESS   3.  Pt  to report no more than 1 instance of urinary leakage per month for improved QOL and skin integrity.  Baseline:  Goal status:  IN PROGRESS   4.  Pt will have 50% less urgency due to bladder retraining and strengthening  Baseline:  Goal status:  MET 11/27/22   5.  Pt to report improved time between bladder voids to at least 2.5 hours with increase in fluid intake for improved QOL with decreased urinary frequency.   Baseline:  Goal status:  IN PROGRESS   6.  Pt to demonstrate  improved thoracic and lumbar mobility to [facility] of ROM for improved mobility at spine and allow more mobility at abdomen.  Baseline:  Goal status:  IN PROGRESS   PLAN:   PT FREQUENCY: 2x/week   PT DURATION:  16 sessions   PLANNED INTERVENTIONS: Therapeutic exercises, Therapeutic activity, Neuromuscular re-education, Patient/Family education, Self Care, Joint mobilization, Aquatic Therapy, Dry Needling, Electrical stimulation, Spinal mobilization, Cryotherapy, Moist heat, scar mobilization, Taping, Traction, Biofeedback, and Manual therapy   PLAN FOR NEXT SESSION: continue manual techniques; progress strengthening and mobility.    Heather Roberts, PT, DPT01/30/2410:23 AM

## 2022-11-29 ENCOUNTER — Ambulatory Visit: Payer: Medicaid Other | Attending: Obstetrics and Gynecology

## 2022-11-29 DIAGNOSIS — R279 Unspecified lack of coordination: Secondary | ICD-10-CM | POA: Insufficient documentation

## 2022-11-29 DIAGNOSIS — M62838 Other muscle spasm: Secondary | ICD-10-CM | POA: Diagnosis present

## 2022-11-29 DIAGNOSIS — R293 Abnormal posture: Secondary | ICD-10-CM | POA: Insufficient documentation

## 2022-11-29 DIAGNOSIS — M6281 Muscle weakness (generalized): Secondary | ICD-10-CM | POA: Diagnosis present

## 2022-11-29 NOTE — Therapy (Signed)
OUTPATIENT PHYSICAL THERAPY TREATMENT NOTE   Patient Name: TEKELIA KAREEM MRN: [id number] DOB:1976/07/07, 47 y.o., female Today's Date: 11/29/2022  PCP: Wardell Honour, MD REFERRING PROVIDER: Bobbye Charleston, MD   END OF SESSION:   PT End of Session - 11/29/22 1146     Visit Number 8    Date for PT Re-Evaluation 01/09/23    Authorization Type Medicaid  [employer]    PT Start Time 5009    PT Stop Time 1225    PT Time Calculation (min) 40 min    Activity Tolerance Patient tolerated treatment well    Behavior During Therapy [facility] for tasks assessed/performed                Past Medical History:  Diagnosis Date   Anemia    Carpal tunnel syndrome, bilateral    Chronic hypertension    Depression    GERD (gastroesophageal reflux disease)    Gestational diabetes    H. pylori infection    dx  09-02-2014-- treated w/ antibiotic   Hypertension    recently low pressures    Hypothyroid    Irregular menstrual cycle    Menorrhagia    PCOS (polycystic ovarian syndrome)    Type 2 diabetes mellitus ([place])    Past Surgical History:  Procedure Laterality Date   CARPAL TUNNEL RELEASE Bilateral 10/07/2014   Procedure: CARPAL TUNNEL RELEASE BILATERAL;  Surgeon: [name], MD;  Location: [place];  Service: Orthopedics;  Laterality: Bilateral;   CESAREAN SECTION  10/29/1993   CESAREAN SECTION WITH BILATERAL TUBAL LIGATION N/A 03/27/2019   Procedure: CESAREAN SECTION WITH BILATERAL TUBAL LIGATION;  Surgeon: Bobbye Charleston, MD;  Location: [place] [facility];  Service: Obstetrics;  Laterality: N/A;   DILITATION & CURRETTAGE/HYSTROSCOPY WITH NOVASURE ABLATION N/A 11/08/2021   Procedure: DILATATION & CURETTAGE/HYSTEROSCOPY WITH NOVASURE ABLATION;  Surgeon: Bobbye Charleston, MD;  Location: [place];  Service: Gynecology;  Laterality: N/A;   EYE SURGERY  09/13/2020   cataract bilateral   GASTRIC ROUX-EN-Y N/A 03/12/2017   Procedure:  LAPAROSCOPIC ROUX-EN-Y GASTRIC BYPASS WITH UPPER ENDOSCOPY;  Surgeon: Kieth Brightly, Arta Bruce, MD;  Location: [facility];  Service: General;  Laterality: N/A;   HEMATOMA EVACUATION  12/09/2021   Procedure: EVACUATION HEMATOMA;  Surgeon: Irene Limbo, MD;  Location: [place];  Service: Plastics;;   I & D EXTREMITY N/A 12/09/2021   Procedure: IRRIGATION AND DEBRIDEMENT ABDOMEN;  Surgeon: Irene Limbo, MD;  Location: [place];  Service: Plastics;  Laterality: N/A;   PANNICULECTOMY N/A 12/08/2021   Procedure: PANNICULECTOMY;  Surgeon: Irene Limbo, MD;  Location: [place];  Service: Plastics;  Laterality: N/A;   TUBAL LIGATION  03/27/2019   UPPER GI ENDOSCOPY  03/12/2017   Procedure: UPPER GI ENDOSCOPY;  Surgeon: Kieth Brightly Arta Bruce, MD;  Location: [facility];  Service: General;;   Patient Active Problem List   Diagnosis Date Noted   S/P panniculectomy 12/09/2021   Iron deficiency anemia 06/26/2021   Vaginal bleeding in pregnancy, third trimester 03/11/2019   GBS bacteriuria 01/22/2019   Pregnancy of unknown anatomic location 08/10/2018   Intertrigo 11/22/2017   S/P gastric bypass 04/04/2017   Morbid obesity ([place]) 03/12/2017   Hypothyroidism 09/11/2016   Allergic rhinitis due to pollen 06/29/2015   Anxiety and depression 06/29/2015   Gastroesophageal reflux disease without esophagitis 06/29/2015   OSA on CPAP 06/29/2015   History of syphilis 05/23/2015   Irregular menstrual cycle 11/12/2013   POLYCYSTIC OVARIAN DISEASE 06/20/2009  REFERRING DIAG: N39.3 (ICD-10-CM) - Stress incontinence (female) (female)  THERAPY DIAG:  Unspecified lack of coordination  Abnormal posture  Muscle weakness (generalized)  Other muscle spasm  Rationale for Evaluation and Treatment Rehabilitation  PERTINENT HISTORY: diabetes, Poorly Controlled, Type 2 Hypothyroidism,   Morbid obesity, Anxiety Depression, GERD,  hypertension, gastric bypass 2018, abdominal and bil adductor skin  removal   PRECAUTIONS: NA  SUBJECTIVE:                                                                                                                                                                                      SUBJECTIVE STATEMENT:  Pt states that pain is worse today, but in different location - more on the sides of her hips. She is going to start working with husband to help release pelvic floor.    PAIN:  Are you having pain? Yes: NPRS scale: 1/10 Pain location: low back/abdomen Pain description: pressure Aggravating factors: random Relieving factors: nothing  SUBJECTIVE STATEMENT: "Every time I cough or sneeze I pee on myself". Small leakage reported. Also with laughing, exercising. Also urgency with leakage too. Wears pads but causes rash and sometimes leaves toilet paper at underwear. Pt reports she has had 5 day history of vaginal discharge with clear/blood tinged mucus noted and hasn't told gyn yet.    Fluid intake: Yes: water - 2 small bottles. Coffee in morning and sometimes poweraide. Rarely soda.      PAIN:  Are you having pain? Yes NPRS scale: 8/10 Pain location:  anterior low abdomen and low back at the same time   Pain type: cramping, tight  Pain description: intermittent    Aggravating factors: unsure seems random. Before would have pain prior to periods and sometimes afterward but now no longer having periods.  Relieving factors: resting   PRECAUTIONS: None   WEIGHT BEARING RESTRICTIONS: No   FALLS:  Has patient fallen in last 6 months? 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   PLOF: Independent   PATIENT GOALS: to have less leakage   PERTINENT HISTORY:  diabetes, Poorly Controlled, Type 2 Hypothyroidism,   Morbid obesity, Anxiety Depression, GERD,  hypertension, gastric bypass 2018, abdominal and bil adductor skin removal    Sexual  abuse: No   BOWEL MOVEMENT: Pain with bowel movement: No Type of bowel movement:Type (Bristol Stool Scale) 4, Frequency daily sometimes 2-3x (largest in morning, others smaller), and Strain No Fully empty rectum: Yes:   Leakage: No Pads: No Fiber supplement: No   URINATION: Pain with urination: No Fully empty bladder:  Yes: but sometimes feels like she will go again 30 mins later Stream: Strong Urgency: Yes:   Frequency: with coffee quicker than 2 hours but not usually, not regularly up at night  Leakage: Urge to void, Coughing, Sneezing, Laughing, Exercise, and Lifting Pads: Yes: sometimes   INTERCOURSE: Pain with intercourse:  not active  no painful    PREGNANCY: Vaginal deliveries 1 Tearing No C-section deliveries 2 Currently pregnant No   PROLAPSE: None     OBJECTIVE:   11/27/22: PELVIC MMT:   MMT eval  Vaginal 4/5, 10 second endurance, 10 repeat contractions  Internal Anal Sphincter    External Anal Sphincter    Puborectalis    Diastasis Recti   (Blank rows = not tested)         TONE: Posterior levator ani restriction/pain   PROLAPSE: Grade 1 anterior vaginal wall laxity  DIAGNOSTIC FINDINGS:    [id number]   COGNITION: Overall cognitive status: Within functional limits for tasks assessed                          SENSATION: Light touch: Appears intact Proprioception: Appears intact   MUSCLE LENGTH: Bil hamstrings and adductors limited by 50%     POSTURE: rounded shoulders, forward head, and posterior pelvic tilt   PELVIC ALIGNMENT:   LUMBARAROM/PROM:   A/PROM A/PROM  eval  Flexion Decreased by 50% with abdominal pain reported  Extension Decreased by 25%   Right lateral flexion Decreased by 50% with abdominal pain reported  Left lateral flexion Decreased by 50% with abdominal pain reported  Right rotation Decreased by 50% with abdominal pain reported  Left rotation Decreased by 50% with abdominal pain reported   (Blank rows = not  tested)   LOWER EXTREMITY ROM:   WFL   LOWER EXTREMITY MMT:   Bil hips 3+/5, knees and ankles 4+/5   PALPATION:   General  TTP throughout midline of abdomen, profoundly tight tissue restrictions throughout midline and low abdominal quadrants and pt has swelling midline with scarring sites post skin removal surgery with transabdominal and bil adductors. Skin changes at scar sites with darkening and very restricted throughout sites in all directions. Midline lower abdominal quadrant most restricted and swollen with stiffness limiting all mobility over bladder. Bil adductors proximally restricted in all directions due to scar restrictions as well and bilaterally pt has areas of scar sites that are red with white dry skin noted and she reports due to friction.                   External Perineal Exam dryness noted externally. Pt reports she has a lot of skin allergies and doesn't know what pads to use and has been using toilet paper in panties for leakage. Pt's vulva and clitoral hood moving well without restrictions does have some labia minor adhesion noted however no redness and tissue WFL.                              Internal Pelvic Floor pt denied all TTP   Patient confirms identification and approves PT to assess internal pelvic floor and treatment Yes      TODAY'S TREATMENT 11/29/22 Manual: Trigger Point Dry-Needling  Treatment instructions: Expect mild to moderate muscle soreness. S/S of pneumothorax if dry needled over a lung field, and to seek immediate medical attention should they occur. Patient verbalized understanding of these instructions  and education.  Patient Consent Given: Yes Education handout provided: Yes Muscles treated: Bil L4/S1 multifidi, Bil glutes/piriformis Electrical stimulation performed: No Parameters: N/A Treatment response/outcome: good twitch response and release Soft tissue mobilization to sacrum/coccyx externally Soft tissue mobilization to lumbar  paraspinals and bil glutes  Negative pressure soft tissue mobilization to lumbar paraspinals and bil glutes  External coccyx mobilization Gr 2-3 Neuromuscular re-education:  Exercises:  Therapeutic activities:    TREATMENT 11/27/22 Manual: Pt provides verbal consent for internal vaginal/rectal pelvic floor exam. Bil deep levator ani release/coccyx attachment release Rt adductor scar tissue mobilization    TREATMENT 11/14/22  Exercises: Seated forward fold 10 breaths Seated thoracic extensions 10x Seated piriformis stretch 60 sec bil Standing side body stretch at bar 60 sec bil Therapeutic activities: Heel raises 2 x 10 Lateral lunge 10x bil at bar for mobility and strengthening Rows 2 x 10, blue band with handles Bil UE extension 2 x 10 blue band with handles Deadlift 10lbs 10x     PATIENT EDUCATION:  Education details: scar mobility  Person educated: Patient Education method: Explanation, Demonstration, Tactile cues, Verbal cues, and Handouts Education comprehension: verbalized understanding and returned demonstration   HOME EXERCISE PROGRAM: [email or web address]    ASSESSMENT:   CLINICAL IMPRESSION: Good response to internal vaginal pelvic floor muscle release demonstrated by decreased in tailbone pain since last session. However, now she is noticing more discomfort and tension in posterolateral hips. This makes sense as coccygeal/sacral attachment points of muscles were released, but distal glutes/piriformis remain tight. Therefore, dry needling and manual techniques performed with excellent tolerance and further release of soft tissue restriction. She will continue to benefit from skilled PT intervention in order to reduce urinary incontinence and improve low back/abdominal pain.    OBJECTIVE IMPAIRMENTS: decreased coordination, decreased endurance, decreased mobility, decreased strength, increased fascial restrictions, increased muscle spasms, impaired flexibility, improper  body mechanics, postural dysfunction, and pain.    ACTIVITY LIMITATIONS: carrying, lifting, squatting, continence, and locomotion level   PARTICIPATION LIMITATIONS: community activity   PERSONAL FACTORS: Fitness, Time since onset of injury/illness/exacerbation, and 1 comorbidity: medical history   are also affecting patient's functional outcome.    REHAB POTENTIAL: Good   CLINICAL DECISION MAKING: Stable/uncomplicated   EVALUATION COMPLEXITY: Low     GOALS: Goals reviewed with patient? Yes   SHORT TERM GOALS: Target date: 10/07/22 - updated 10/24/22 updated 11/27/22   Pt to be I with HEP.  Baseline: Goal status:  MET 11/27/22   2.  Pt to be I with scar mobility and diaphragmatic breathing for improved abdominal mobility and decreased tissue restrictions.  Baseline:  Goal status:  MET 11/27/22   3.  Pt to demonstrate improved coordination with pelvic floor and breathing mechanics with body weight squats for pelvic stability and decreased leakage.  Baseline:  Goal status:  MET 11/27/22     LONG TERM GOALS: Target date: 01/09/23   Pt to be I with advanced HEP.  Baseline:  Goal status: IN PROGRESS   2.  Pt to demonstrate improved coordination with pelvic floor and breathing mechanics with 20#  squats for pelvic stability and decreased leakage.  Baseline:  Goal status:  IN PROGRESS   3.  Pt to report no more than 1 instance of urinary leakage per month for improved QOL and skin integrity.  Baseline:  Goal status:  IN PROGRESS   4.  Pt will have 50% less urgency due to bladder retraining and strengthening  Baseline:  Goal status:  MET 11/27/22  5.  Pt to report improved time between bladder voids to at least 2.5 hours with increase in fluid intake for improved QOL with decreased urinary frequency.   Baseline:  Goal status:  IN PROGRESS   6.  Pt to demonstrate improved thoracic and lumbar mobility to [facility] of ROM for improved mobility at spine and allow more mobility at  abdomen.  Baseline:  Goal status:  IN PROGRESS   PLAN:   PT FREQUENCY: 2x/week   PT DURATION:  16 sessions   PLANNED INTERVENTIONS: Therapeutic exercises, Therapeutic activity, Neuromuscular re-education, Patient/Family education, Self Care, Joint mobilization, Aquatic Therapy, Dry Needling, Electrical stimulation, Spinal mobilization, Cryotherapy, Moist heat, scar mobilization, Taping, Traction, Biofeedback, and Manual therapy   PLAN FOR NEXT SESSION: continue manual techniques; progress strengthening and mobility.    Heather Roberts, PT, DPT02/10/2410:11 PM

## 2022-12-04 ENCOUNTER — Ambulatory Visit: Payer: Medicaid Other

## 2022-12-04 DIAGNOSIS — R293 Abnormal posture: Secondary | ICD-10-CM

## 2022-12-04 DIAGNOSIS — R279 Unspecified lack of coordination: Secondary | ICD-10-CM

## 2022-12-04 DIAGNOSIS — M6281 Muscle weakness (generalized): Secondary | ICD-10-CM

## 2022-12-04 DIAGNOSIS — M62838 Other muscle spasm: Secondary | ICD-10-CM

## 2022-12-04 NOTE — Therapy (Signed)
OUTPATIENT PHYSICAL THERAPY TREATMENT NOTE   Patient Name: [name] MRN: [id number] DOB:[birth year], 47 y.o., female Today's Date: 12/04/2022  PCP: Wardell Honour, MD REFERRING PROVIDER: Bobbye Charleston, MD   END OF SESSION:   PT End of Session - 12/04/22 0931     Visit Number 9    Date for PT Re-Evaluation 01/09/23    Authorization Type Medicaid  [employer]    Authorization Time Period 10/16/2022-01/13/2023    Authorization - Visit Number 9    Authorization - Number of Visits 10    PT Start Time 0930    PT Stop Time 1010    PT Time Calculation (min) 40 min    Activity Tolerance Patient tolerated treatment well    Behavior During Therapy [facility] for tasks assessed/performed                Past Medical History:  Diagnosis Date   Anemia    Carpal tunnel syndrome, bilateral    Chronic hypertension    Depression    GERD (gastroesophageal reflux disease)    Gestational diabetes    H. pylori infection    dx  09-02-2014-- treated w/ antibiotic   Hypertension    recently low pressures    Hypothyroid    Irregular menstrual cycle    Menorrhagia    PCOS (polycystic ovarian syndrome)    Type 2 diabetes mellitus ([place])    Past Surgical History:  Procedure Laterality Date   CARPAL TUNNEL RELEASE Bilateral 10/07/2014   Procedure: CARPAL TUNNEL RELEASE BILATERAL;  Surgeon: [name], MD;  Location: [place];  Service: Orthopedics;  Laterality: Bilateral;   CESAREAN SECTION  10/29/1993   CESAREAN SECTION WITH BILATERAL TUBAL LIGATION N/A 03/27/2019   Procedure: CESAREAN SECTION WITH BILATERAL TUBAL LIGATION;  Surgeon: Bobbye Charleston, MD;  Location: [place] [facility];  Service: Obstetrics;  Laterality: N/A;   DILITATION & CURRETTAGE/HYSTROSCOPY WITH NOVASURE ABLATION N/A 11/08/2021   Procedure: DILATATION & CURETTAGE/HYSTEROSCOPY WITH NOVASURE ABLATION;  Surgeon: Bobbye Charleston, MD;  Location: [place];  Service: Gynecology;   Laterality: N/A;   EYE SURGERY  09/13/2020   cataract bilateral   GASTRIC ROUX-EN-Y N/A 03/12/2017   Procedure: LAPAROSCOPIC ROUX-EN-Y GASTRIC BYPASS WITH UPPER ENDOSCOPY;  Surgeon: Kieth Brightly, Arta Bruce, MD;  Location: [facility];  Service: General;  Laterality: N/A;   HEMATOMA EVACUATION  12/09/2021   Procedure: EVACUATION HEMATOMA;  Surgeon: Irene Limbo, MD;  Location: [place];  Service: Plastics;;   I & D EXTREMITY N/A 12/09/2021   Procedure: IRRIGATION AND DEBRIDEMENT ABDOMEN;  Surgeon: Irene Limbo, MD;  Location: [place];  Service: Plastics;  Laterality: N/A;   PANNICULECTOMY N/A 12/08/2021   Procedure: PANNICULECTOMY;  Surgeon: Irene Limbo, MD;  Location: [place];  Service: Plastics;  Laterality: N/A;   TUBAL LIGATION  03/27/2019   UPPER GI ENDOSCOPY  03/12/2017   Procedure: UPPER GI ENDOSCOPY;  Surgeon: Kieth Brightly Arta Bruce, MD;  Location: [facility];  Service: General;;   Patient Active Problem List   Diagnosis Date Noted   S/P panniculectomy 12/09/2021   Iron deficiency anemia 06/26/2021   Vaginal bleeding in pregnancy, third trimester 03/11/2019   GBS bacteriuria 01/22/2019   Pregnancy of unknown anatomic location 08/10/2018   Intertrigo 11/22/2017   S/P gastric bypass 04/04/2017   Morbid obesity ([place]) 03/12/2017   Hypothyroidism 09/11/2016   Allergic rhinitis due to pollen 06/29/2015   Anxiety and depression 06/29/2015   Gastroesophageal reflux disease without esophagitis 06/29/2015  OSA on CPAP 06/29/2015   History of syphilis 05/23/2015   Irregular menstrual cycle 11/12/2013   POLYCYSTIC OVARIAN DISEASE 06/20/2009    REFERRING DIAG: N39.3 (ICD-10-CM) - Stress incontinence (female) (female)  THERAPY DIAG:  Unspecified lack of coordination  Abnormal posture  Muscle weakness (generalized)  Other muscle spasm  Rationale for Evaluation and Treatment Rehabilitation  PERTINENT HISTORY: diabetes, Poorly Controlled, Type 2 Hypothyroidism,    Morbid obesity, Anxiety Depression, GERD,  hypertension, gastric bypass 2018, abdominal and bil adductor skin removal   PRECAUTIONS: NA  SUBJECTIVE:                                                                                                                                                                                      SUBJECTIVE STATEMENT:  Pt states that is is having more pain in Lt lower quadrant and she has had a lot of swelling in her lower abdomen. She reports passing clots as well. She states that she has not had any coccyx pain. She feels like low back is better, but she does continue to have pain, especially after sleeping. Within several minutes after walking around low back pain decreases, but then will move to around her abdomen.    PAIN:  Are you having pain? Yes: NPRS scale: 5/10 Pain location: low back/abdomen Pain description: pressure Aggravating factors: random Relieving factors: nothing  SUBJECTIVE STATEMENT: "Every time I cough or sneeze I pee on myself". Small leakage reported. Also with laughing, exercising. Also urgency with leakage too. Wears pads but causes rash and sometimes leaves toilet paper at underwear. Pt reports she has had 5 day history of vaginal discharge with clear/blood tinged mucus noted and hasn't told gyn yet.    Fluid intake: Yes: water - 2 small bottles. Coffee in morning and sometimes poweraide. Rarely soda.      PAIN:  Are you having pain? Yes NPRS scale: 8/10 Pain location:  anterior low abdomen and low back at the same time   Pain type: cramping, tight  Pain description: intermittent    Aggravating factors: unsure seems random. Before would have pain prior to periods and sometimes afterward but now no longer having periods.  Relieving factors: resting   PRECAUTIONS: None   WEIGHT BEARING RESTRICTIONS: No   FALLS:  Has patient fallen in last 6 months? 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   PLOF: Independent   PATIENT GOALS: to have less leakage   PERTINENT HISTORY:  diabetes, Poorly Controlled, Type 2 Hypothyroidism,   Morbid obesity, Anxiety Depression, GERD,  hypertension, gastric bypass 2018, abdominal and bil adductor skin removal    Sexual abuse: No   BOWEL MOVEMENT: Pain with bowel movement: No Type of bowel movement:Type (Bristol Stool Scale) 4, Frequency daily sometimes 2-3x (largest in morning, others smaller), and Strain No Fully empty rectum: Yes:   Leakage: No Pads: No Fiber supplement: No   URINATION: Pain with urination: No Fully empty bladder: Yes: but sometimes feels like she will go again 30 mins later Stream: Strong Urgency: Yes:   Frequency: with coffee quicker than 2 hours but not usually, not regularly up at night  Leakage: Urge to void, Coughing, Sneezing, Laughing, Exercise, and Lifting Pads: Yes: sometimes   INTERCOURSE: Pain with intercourse:  not active  no painful    PREGNANCY: Vaginal deliveries 1 Tearing No C-section deliveries 2 Currently pregnant No   PROLAPSE: None     OBJECTIVE:   11/27/22: PELVIC MMT:   MMT eval  Vaginal 4/5, 10 second endurance, 10 repeat contractions  Internal Anal Sphincter    External Anal Sphincter    Puborectalis    Diastasis Recti   (Blank rows = not tested)         TONE: Posterior levator ani restriction/pain   PROLAPSE: Grade 1 anterior vaginal wall laxity  DIAGNOSTIC FINDINGS:    [id number]   COGNITION: Overall cognitive status: Within functional limits for tasks assessed                          SENSATION: Light touch: Appears intact Proprioception: Appears intact   MUSCLE LENGTH: Bil hamstrings and adductors limited by 50%     POSTURE: rounded shoulders, forward head, and posterior pelvic tilt   PELVIC ALIGNMENT:   LUMBARAROM/PROM:   A/PROM A/PROM  eval  Flexion Decreased by 50%  with abdominal pain reported  Extension Decreased by 25%   Right lateral flexion Decreased by 50% with abdominal pain reported  Left lateral flexion Decreased by 50% with abdominal pain reported  Right rotation Decreased by 50% with abdominal pain reported  Left rotation Decreased by 50% with abdominal pain reported   (Blank rows = not tested)   LOWER EXTREMITY ROM:   WFL   LOWER EXTREMITY MMT:   Bil hips 3+/5, knees and ankles 4+/5   PALPATION:   General  TTP throughout midline of abdomen, profoundly tight tissue restrictions throughout midline and low abdominal quadrants and pt has swelling midline with scarring sites post skin removal surgery with transabdominal and bil adductors. Skin changes at scar sites with darkening and very restricted throughout sites in all directions. Midline lower abdominal quadrant most restricted and swollen with stiffness limiting all mobility over bladder. Bil adductors proximally restricted in all directions due to scar restrictions as well and bilaterally pt has areas of scar sites that are red with white dry skin noted and she reports due to friction.                   External Perineal Exam dryness noted externally. Pt reports she has a lot of skin allergies and doesn't know what pads to use and has been using toilet paper in panties for leakage. Pt's vulva and clitoral hood moving well without restrictions does have some labia minor adhesion noted however no redness and tissue WFL.                              Internal  Pelvic Floor pt denied all TTP   Patient confirms identification and approves PT to assess internal pelvic floor and treatment Yes      TODAY'S TREATMENT 12/04/22 Manual: Trigger Point Dry-Needling  Treatment instructions: Expect mild to moderate muscle soreness. S/S of pneumothorax if dry needled over a lung field, and to seek immediate medical attention should they occur. Patient verbalized understanding of these instructions and  education.  Patient Consent Given: Yes Education handout provided: Previously provided Muscles treated: Lt TFL/glutes Electrical stimulation performed: No Parameters: N/A Treatment response/outcome: complete reduction of pain Soft tissue mobilization to bil lumbar paraspinals/obliques Exercises: Lower trunk rotation 2 x 10 Supine piriformis stretch 2 x 60 sec Lt    TREATMENT 11/29/22 Manual: Trigger Point Dry-Needling  Treatment instructions: Expect mild to moderate muscle soreness. S/S of pneumothorax if dry needled over a lung field, and to seek immediate medical attention should they occur. Patient verbalized understanding of these instructions and education.  Patient Consent Given: Yes Education handout provided: Yes Muscles treated: Bil L4/S1 multifidi, Bil glutes/piriformis Electrical stimulation performed: No Parameters: N/A Treatment response/outcome: good twitch response and release Soft tissue mobilization to sacrum/coccyx externally Soft tissue mobilization to lumbar paraspinals and bil glutes  Negative pressure soft tissue mobilization to lumbar paraspinals and bil glutes  External coccyx mobilization Gr 2-3 Neuromuscular re-education:  Exercises:  Therapeutic activities:    TREATMENT 11/27/22 Manual: Pt provides verbal consent for internal vaginal/rectal pelvic floor exam. Bil deep levator ani release/coccyx attachment release Rt adductor scar tissue mobilization     PATIENT EDUCATION:  Education details: scar mobility  Person educated: Patient Education method: Consulting civil engineer, Demonstration, Tactile cues, Verbal cues, and Handouts Education comprehension: verbalized understanding and returned demonstration   HOME EXERCISE PROGRAM: [email or web address]    ASSESSMENT:   CLINICAL IMPRESSION: Pt having more pain today and is going to MD after this visit to determine what the cause is behind lower abdominal pain, swelling, and abnormal bleeding. She has had great  improvement in coccydynia since addressing pelvic floor restriction, but less so in low back. We addressed lumbar paraspinal and oblique tension today with good improvements. We discussed that strengthening and mobility will be important in help to reduce this pain long term. When performing lower trunk rotations, she had severe pain in Lt lateral hip; with palpation, notable trigger points present. Excellent tolerance to dry needling here with 0/10 pain after treatment. She will continue to benefit from skilled PT intervention in order to reduce urinary incontinence and improve low back/abdominal pain.    OBJECTIVE IMPAIRMENTS: decreased coordination, decreased endurance, decreased mobility, decreased strength, increased fascial restrictions, increased muscle spasms, impaired flexibility, improper body mechanics, postural dysfunction, and pain.    ACTIVITY LIMITATIONS: carrying, lifting, squatting, continence, and locomotion level   PARTICIPATION LIMITATIONS: community activity   PERSONAL FACTORS: Fitness, Time since onset of injury/illness/exacerbation, and 1 comorbidity: medical history   are also affecting patient's functional outcome.    REHAB POTENTIAL: Good   CLINICAL DECISION MAKING: Stable/uncomplicated   EVALUATION COMPLEXITY: Low     GOALS: Goals reviewed with patient? Yes   SHORT TERM GOALS: Target date: 10/07/22 - updated 10/24/22 updated 11/27/22   Pt to be I with HEP.  Baseline: Goal status:  MET 11/27/22   2.  Pt to be I with scar mobility and diaphragmatic breathing for improved abdominal mobility and decreased tissue restrictions.  Baseline:  Goal status:  MET 11/27/22   3.  Pt to demonstrate improved coordination with pelvic floor and breathing mechanics  with body weight squats for pelvic stability and decreased leakage.  Baseline:  Goal status:  MET 11/27/22     LONG TERM GOALS: Target date: 01/09/23   Pt to be I with advanced HEP.  Baseline:  Goal status: IN  PROGRESS   2.  Pt to demonstrate improved coordination with pelvic floor and breathing mechanics with 20#  squats for pelvic stability and decreased leakage.  Baseline:  Goal status:  IN PROGRESS   3.  Pt to report no more than 1 instance of urinary leakage per month for improved QOL and skin integrity.  Baseline:  Goal status:  IN PROGRESS   4.  Pt will have 50% less urgency due to bladder retraining and strengthening  Baseline:  Goal status:  MET 11/27/22   5.  Pt to report improved time between bladder voids to at least 2.5 hours with increase in fluid intake for improved QOL with decreased urinary frequency.   Baseline:  Goal status:  IN PROGRESS   6.  Pt to demonstrate improved thoracic and lumbar mobility to [facility] of ROM for improved mobility at spine and allow more mobility at abdomen.  Baseline:  Goal status:  IN PROGRESS   PLAN:   PT FREQUENCY: 2x/week   PT DURATION:  16 sessions   PLANNED INTERVENTIONS: Therapeutic exercises, Therapeutic activity, Neuromuscular re-education, Patient/Family education, Self Care, Joint mobilization, Aquatic Therapy, Dry Needling, Electrical stimulation, Spinal mobilization, Cryotherapy, Moist heat, scar mobilization, Taping, Traction, Biofeedback, and Manual therapy   PLAN FOR NEXT SESSION: continue manual techniques; progress strengthening and mobility. New auth.   Heather Roberts, PT, DPT02/03/2409:41 AM

## 2022-12-06 ENCOUNTER — Ambulatory Visit: Payer: Medicaid Other

## 2022-12-06 DIAGNOSIS — R293 Abnormal posture: Secondary | ICD-10-CM

## 2022-12-06 DIAGNOSIS — R279 Unspecified lack of coordination: Secondary | ICD-10-CM | POA: Diagnosis not present

## 2022-12-06 DIAGNOSIS — M62838 Other muscle spasm: Secondary | ICD-10-CM

## 2022-12-06 DIAGNOSIS — M6281 Muscle weakness (generalized): Secondary | ICD-10-CM

## 2022-12-06 NOTE — H&P (Signed)
Subjective:     Patient ID: [name] is a 47 y.o. female.     Here for follow up visit prior to planned bilateral excision soft tissue excess chest wall and axillae. Notes significant soft tissue arms/axillae that develops recurrent rashes. Has failed at least 6 month trial hygiene measures and topical antifungal use. Surgery scheduled November 2023 due to severe hypertension in preoperative area. Since that time, seen by PCP; continues on amlodipine and lisinopril.   Thigh lipectcomy 03/2022: Total lipoaspirate 650 ml. 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 Highest weight 390 lb. Lowest weight post bypass 190 lb. Had pregnancy following bypass, delivered 02/2019. Underwent panniculectomy 11/2021. Course complicated by hematoma readmission POD#1 and surgery for evacuation hematoma. Total 5105 g soft tissue resection panniculectomy.   OSA and DM resolved with weight loss. HbA1c 03/2022 6.0. HTN improved post weight loss- reports fewer medications.    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    Lives with infant, who has [place]. Has adult daughter and son in area to assist with post op care.   Review of Systems      Objective:   Physical Exam Cardiovascular:     Rate and Rhythm: Normal rate and regular rhythm.     Heart sounds: Normal heart sounds.  Pulmonary:     Effort: Pulmonary effort is normal.     Breath sounds: Normal breath sounds.    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   Recurrent intertrigo axilla/upper arms that has failed conservative management.  Plan lipectomy bilateral axillae and proximal arms. Reviewed location scars. Reviewed most common complication is hypertrophic scarring, scar across axillae can lead to scar contracture, may require additional surgery/procedures for this. Reviewed will have no effect no lateral chest wall/lateral breast soft tissue rolls.   Additional risks including but not limited to bleeding seroma hematoma damage to adjacent structures infection wound healing problems lymphedema asymmetry blood clots in legs or lungs reviewed.   Discussed OP surgery possible drains need for compression garments and post op limitations.   Rx for oxycodone given in November-patient still has this.

## 2022-12-06 NOTE — Therapy (Signed)
OUTPATIENT PHYSICAL THERAPY TREATMENT NOTE   Patient Name: [name] MRN: [id number] DOB:12-17-1975, 47 y.o., female Today's Date: 12/06/2022  PCP: Wardell Honour, MD REFERRING PROVIDER: Bobbye Charleston, MD   END OF SESSION:   PT End of Session - 12/06/22 1150     Visit Number 10    Date for PT Re-Evaluation 01/09/23    Authorization Type Medicaid  [employer]    Authorization Time Period 10/16/2022-01/13/2023    Authorization - Visit Number 10    Authorization - Number of Visits 10    PT Start Time 7353    PT Stop Time 1227    PT Time Calculation (min) 38 min    Activity Tolerance Patient tolerated treatment well    Behavior During Therapy [facility] for tasks assessed/performed                Past Medical History:  Diagnosis Date   Anemia    Carpal tunnel syndrome, bilateral    Chronic hypertension    Depression    GERD (gastroesophageal reflux disease)    Gestational diabetes    H. pylori infection    dx  09-02-2014-- treated w/ antibiotic   Hypertension    recently low pressures    Hypothyroid    Irregular menstrual cycle    Menorrhagia    PCOS (polycystic ovarian syndrome)    Type 2 diabetes mellitus ([place])    Past Surgical History:  Procedure Laterality Date   CARPAL TUNNEL RELEASE Bilateral 10/07/2014   Procedure: CARPAL TUNNEL RELEASE BILATERAL;  Surgeon: [name], MD;  Location: [place];  Service: Orthopedics;  Laterality: Bilateral;   CESAREAN SECTION  10/29/1993   CESAREAN SECTION WITH BILATERAL TUBAL LIGATION N/A 03/27/2019   Procedure: CESAREAN SECTION WITH BILATERAL TUBAL LIGATION;  Surgeon: Bobbye Charleston, MD;  Location: [place] [facility];  Service: Obstetrics;  Laterality: N/A;   DILITATION & CURRETTAGE/HYSTROSCOPY WITH NOVASURE ABLATION N/A 11/08/2021   Procedure: DILATATION & CURETTAGE/HYSTEROSCOPY WITH NOVASURE ABLATION;  Surgeon: Bobbye Charleston, MD;  Location: [place];  Service:  Gynecology;  Laterality: N/A;   EYE SURGERY  09/13/2020   cataract bilateral   GASTRIC ROUX-EN-Y N/A 03/12/2017   Procedure: LAPAROSCOPIC ROUX-EN-Y GASTRIC BYPASS WITH UPPER ENDOSCOPY;  Surgeon: Kieth Brightly, Arta Bruce, MD;  Location: [facility];  Service: General;  Laterality: N/A;   HEMATOMA EVACUATION  12/09/2021   Procedure: EVACUATION HEMATOMA;  Surgeon: Irene Limbo, MD;  Location: [place];  Service: Plastics;;   I & D EXTREMITY N/A 12/09/2021   Procedure: IRRIGATION AND DEBRIDEMENT ABDOMEN;  Surgeon: Irene Limbo, MD;  Location: [place];  Service: Plastics;  Laterality: N/A;   PANNICULECTOMY N/A 12/08/2021   Procedure: PANNICULECTOMY;  Surgeon: Irene Limbo, MD;  Location: [place];  Service: Plastics;  Laterality: N/A;   TUBAL LIGATION  03/27/2019   UPPER GI ENDOSCOPY  03/12/2017   Procedure: UPPER GI ENDOSCOPY;  Surgeon: Kieth Brightly Arta Bruce, MD;  Location: [facility];  Service: General;;   Patient Active Problem List   Diagnosis Date Noted   S/P panniculectomy 12/09/2021   Iron deficiency anemia 06/26/2021   Vaginal bleeding in pregnancy, third trimester 03/11/2019   GBS bacteriuria 01/22/2019   Pregnancy of unknown anatomic location 08/10/2018   Intertrigo 11/22/2017   S/P gastric bypass 04/04/2017   Morbid obesity ([place]) 03/12/2017   Hypothyroidism 09/11/2016   Allergic rhinitis due to pollen 06/29/2015   Anxiety and depression 06/29/2015   Gastroesophageal reflux disease without esophagitis 06/29/2015  OSA on CPAP 06/29/2015   History of syphilis 05/23/2015   Irregular menstrual cycle 11/12/2013   POLYCYSTIC OVARIAN DISEASE 06/20/2009    REFERRING DIAG: N39.3 (ICD-10-CM) - Stress incontinence (female) (female)  THERAPY DIAG:  Unspecified lack of coordination  Abnormal posture  Muscle weakness (generalized)  Other muscle spasm  Rationale for Evaluation and Treatment Rehabilitation  PERTINENT HISTORY: diabetes, Poorly Controlled, Type 2  Hypothyroidism,   Morbid obesity, Anxiety Depression, GERD,  hypertension, gastric bypass 2018, abdominal and bil adductor skin removal   PRECAUTIONS: NA  SUBJECTIVE:                                                                                                                                                                                      SUBJECTIVE STATEMENT:  Pt states that she continues to feel significant improvement in urinary symptoms, but coccyx pain is still present; she can sit for longer periods of time without increase in pain, but she has to start shifting in the car after about 45 minutes and still cannot sit comfortably on hard surface.    PAIN:  Are you having pain? Yes: NPRS scale: 1.5/10 Pain location: low back/abdomen Pain description: pressure Aggravating factors: random Relieving factors: nothing  SUBJECTIVE STATEMENT: "Every time I cough or sneeze I pee on myself". Small leakage reported. Also with laughing, exercising. Also urgency with leakage too. Wears pads but causes rash and sometimes leaves toilet paper at underwear. Pt reports she has had 5 day history of vaginal discharge with clear/blood tinged mucus noted and hasn't told gyn yet.    Fluid intake: Yes: water - 2 small bottles. Coffee in morning and sometimes poweraide. Rarely soda.      PAIN:  Are you having pain? Yes NPRS scale: 8/10 Pain location:  anterior low abdomen and low back at the same time   Pain type: cramping, tight  Pain description: intermittent    Aggravating factors: unsure seems random. Before would have pain prior to periods and sometimes afterward but now no longer having periods.  Relieving factors: resting   PRECAUTIONS: None   WEIGHT BEARING RESTRICTIONS: No   FALLS:  Has patient fallen in last 6 months? 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   PLOF:  Independent   PATIENT GOALS: to have less leakage   PERTINENT HISTORY:  diabetes, Poorly Controlled, Type 2 Hypothyroidism,   Morbid obesity, Anxiety Depression, GERD,  hypertension, gastric bypass 2018, abdominal and bil adductor skin removal    Sexual abuse: No   BOWEL MOVEMENT: Pain with bowel movement:  No Type of bowel movement:Type (Bristol Stool Scale) 4, Frequency daily sometimes 2-3x (largest in morning, others smaller), and Strain No Fully empty rectum: Yes:   Leakage: No Pads: No Fiber supplement: No   URINATION: Pain with urination: No Fully empty bladder: Yes: but sometimes feels like she will go again 30 mins later Stream: Strong Urgency: Yes:   Frequency: with coffee quicker than 2 hours but not usually, not regularly up at night  Leakage: Urge to void, Coughing, Sneezing, Laughing, Exercise, and Lifting Pads: Yes: sometimes   INTERCOURSE: Pain with intercourse:  not active  no painful    PREGNANCY: Vaginal deliveries 1 Tearing No C-section deliveries 2 Currently pregnant No   PROLAPSE: None     OBJECTIVE:   PFIQ-7: 62  11/27/22: PELVIC MMT:   MMT eval  Vaginal 4/5, 10 second endurance, 10 repeat contractions  Internal Anal Sphincter    External Anal Sphincter    Puborectalis    Diastasis Recti   (Blank rows = not tested)         TONE: Posterior levator ani restriction/pain   PROLAPSE: Grade 1 anterior vaginal wall laxity  DIAGNOSTIC FINDINGS:    PFIQ-7 38   COGNITION: Overall cognitive status: Within functional limits for tasks assessed                          SENSATION: Light touch: Appears intact Proprioception: Appears intact   MUSCLE LENGTH: Bil hamstrings and adductors limited by 50%     POSTURE: rounded shoulders, forward head, and posterior pelvic tilt   PELVIC ALIGNMENT:   LUMBARAROM/PROM:   A/PROM A/PROM  eval  Flexion Decreased by 50% with abdominal pain reported  Extension Decreased by 25%   Right lateral  flexion Decreased by 50% with abdominal pain reported  Left lateral flexion Decreased by 50% with abdominal pain reported  Right rotation Decreased by 50% with abdominal pain reported  Left rotation Decreased by 50% with abdominal pain reported   (Blank rows = not tested)   LOWER EXTREMITY ROM:   WFL   LOWER EXTREMITY MMT:   Bil hips 3+/5, knees and ankles 4+/5   PALPATION:   General  TTP throughout midline of abdomen, profoundly tight tissue restrictions throughout midline and low abdominal quadrants and pt has swelling midline with scarring sites post skin removal surgery with transabdominal and bil adductors. Skin changes at scar sites with darkening and very restricted throughout sites in all directions. Midline lower abdominal quadrant most restricted and swollen with stiffness limiting all mobility over bladder. Bil adductors proximally restricted in all directions due to scar restrictions as well and bilaterally pt has areas of scar sites that are red with white dry skin noted and she reports due to friction.                   External Perineal Exam dryness noted externally. Pt reports she has a lot of skin allergies and doesn't know what pads to use and has been using toilet paper in panties for leakage. Pt's vulva and clitoral hood moving well without restrictions does have some labia minor adhesion noted however no redness and tissue WFL.                              Internal Pelvic Floor pt denied all TTP   Patient confirms identification and approves PT to assess internal pelvic floor and treatment  Yes      TODAY'S TREATMENT 12/06/22 Manual: Pt provides verbal consent for internal vaginal/rectal pelvic floor exam. Intra-rectal exam and treatment Rectal pelvic floor muscle release Coccyx mobilizations/traction   TREATMENT 12/04/22 Manual: Trigger Point Dry-Needling  Treatment instructions: Expect mild to moderate muscle soreness. S/S of pneumothorax if dry needled over a  lung field, and to seek immediate medical attention should they occur. Patient verbalized understanding of these instructions and education.  Patient Consent Given: Yes Education handout provided: Previously provided Muscles treated: Lt TFL/glutes Electrical stimulation performed: No Parameters: N/A Treatment response/outcome: complete reduction of pain Soft tissue mobilization to bil lumbar paraspinals/obliques Exercises: Lower trunk rotation 2 x 10 Supine piriformis stretch 2 x 60 sec Lt    TREATMENT 11/29/22 Manual: Trigger Point Dry-Needling  Treatment instructions: Expect mild to moderate muscle soreness. S/S of pneumothorax if dry needled over a lung field, and to seek immediate medical attention should they occur. Patient verbalized understanding of these instructions and education.  Patient Consent Given: Yes Education handout provided: Yes Muscles treated: Bil L4/S1 multifidi, Bil glutes/piriformis Electrical stimulation performed: No Parameters: N/A Treatment response/outcome: good twitch response and release Soft tissue mobilization to sacrum/coccyx externally Soft tissue mobilization to lumbar paraspinals and bil glutes  Negative pressure soft tissue mobilization to lumbar paraspinals and bil glutes  External coccyx mobilization Gr 2-3     PATIENT EDUCATION:  Education details: scar mobility  Person educated: Patient Education method: Consulting civil engineer, Demonstration, Tactile cues, Verbal cues, and Handouts Education comprehension: verbalized understanding and returned demonstration   HOME EXERCISE PROGRAM: [email or web address]    ASSESSMENT:   CLINICAL IMPRESSION: Pt still seeing progress with urinary incontinence; however, her coccyx, abdominal, and low back pain continue to be persistent even if they have improved some. Due to the impact of scar tissue, believe that abdominal muscles are not able to be as supportive at this time and core weakness is leading to pain and  dysfunction in these areas. Today's treatment focused on intra-rectal exam and muscle/fascial release to improve restriction at coccyx. She tolerated treatment very well and demonstrated good improvements in restriction. She did have some coccyx soreness after treatment session, which she was told was normal. She was encouraged to continue working on all exercises/stretches leading up to surgery on the 19th. She will continue to benefit from skilled PT intervention in order to increase functional pelvic floor strength and improve coccyx/low back/abdominal pain.    OBJECTIVE IMPAIRMENTS: decreased coordination, decreased endurance, decreased mobility, decreased strength, increased fascial restrictions, increased muscle spasms, impaired flexibility, improper body mechanics, postural dysfunction, and pain.    ACTIVITY LIMITATIONS: carrying, lifting, squatting, continence, and locomotion level   PARTICIPATION LIMITATIONS: community activity   PERSONAL FACTORS: Fitness, Time since onset of injury/illness/exacerbation, and 1 comorbidity: medical history   are also affecting patient's functional outcome.    REHAB POTENTIAL: Good   CLINICAL DECISION MAKING: Stable/uncomplicated   EVALUATION COMPLEXITY: Low     GOALS: Goals reviewed with patient? Yes   SHORT TERM GOALS: Target date: 10/07/22 - updated 10/24/22 updated 11/27/22   Pt to be I with HEP.  Baseline: Goal status:  MET 11/27/22   2.  Pt to be I with scar mobility and diaphragmatic breathing for improved abdominal mobility and decreased tissue restrictions.  Baseline:  Goal status:  MET 11/27/22   3.  Pt to demonstrate improved coordination with pelvic floor and breathing mechanics with body weight squats for pelvic stability and decreased leakage.  Baseline:  Goal status:  MET  11/27/22     LONG TERM GOALS: Target date: 01/09/23   Pt to be I with advanced HEP.  Baseline:  Goal status: IN PROGRESS   2.  Pt to demonstrate improved  coordination with pelvic floor and breathing mechanics with 20#  squats for pelvic stability and decreased leakage.  Baseline:  Goal status:  IN PROGRESS   3.  Pt to report no more than 1 instance of urinary leakage per month for improved QOL and skin integrity.  Baseline:  Goal status:  IN PROGRESS   4.  Pt will have 50% less urgency due to bladder retraining and strengthening  Baseline:  Goal status:  MET 11/27/22   5.  Pt to report improved time between bladder voids to at least 2.5 hours with increase in fluid intake for improved QOL with decreased urinary frequency.   Baseline:  Goal status:  IN PROGRESS   6.  Pt to demonstrate improved thoracic and lumbar mobility to [facility] of ROM for improved mobility at spine and allow more mobility at abdomen.  Baseline:  Goal status:  IN PROGRESS   PLAN:   PT FREQUENCY: 2x/week   PT DURATION:  16 sessions   PLANNED INTERVENTIONS: Therapeutic exercises, Therapeutic activity, Neuromuscular re-education, Patient/Family education, Self Care, Joint mobilization, Aquatic Therapy, Dry Needling, Electrical stimulation, Spinal mobilization, Cryotherapy, Moist heat, scar mobilization, Taping, Traction, Biofeedback, and Manual therapy   PLAN FOR NEXT SESSION: continue manual techniques; progress strengthening and mobility.    Heather Roberts, PT, DPT02/05/2411:31 PM

## 2022-12-11 ENCOUNTER — Ambulatory Visit: Payer: Medicaid Other

## 2022-12-11 DIAGNOSIS — M6281 Muscle weakness (generalized): Secondary | ICD-10-CM

## 2022-12-11 DIAGNOSIS — R279 Unspecified lack of coordination: Secondary | ICD-10-CM

## 2022-12-11 DIAGNOSIS — R293 Abnormal posture: Secondary | ICD-10-CM

## 2022-12-11 DIAGNOSIS — M62838 Other muscle spasm: Secondary | ICD-10-CM

## 2022-12-11 NOTE — Therapy (Signed)
OUTPATIENT PHYSICAL THERAPY TREATMENT NOTE   Patient Name: [name] MRN: [id number] DOB:[birth year], 47 y.o., female Today's Date: 12/11/2022  PCP: Wardell Honour, MD REFERRING PROVIDER: Bobbye Charleston, MD   END OF SESSION:   PT End of Session - 12/11/22 0933     Visit Number 11    Date for PT Re-Evaluation 01/09/23    Authorization Type Medicaid  healthy blue    Authorization Time Period 12/10/2022-02/07/2023    Authorization - Visit Number 1    Authorization - Number of Visits 5    PT Start Time 0930    PT Stop Time 1010    PT Time Calculation (min) 40 min    Activity Tolerance Patient tolerated treatment well    Behavior During Therapy [facility] for tasks assessed/performed                Past Medical History:  Diagnosis Date   Anemia    Carpal tunnel syndrome, bilateral    Chronic hypertension    Depression    GERD (gastroesophageal reflux disease)    Gestational diabetes    H. pylori infection    dx  09-02-2014-- treated w/ antibiotic   Hypertension    recently low pressures    Hypothyroid    Irregular menstrual cycle    Menorrhagia    PCOS (polycystic ovarian syndrome)    Type 2 diabetes mellitus ([place])    Past Surgical History:  Procedure Laterality Date   CARPAL TUNNEL RELEASE Bilateral 10/07/2014   Procedure: CARPAL TUNNEL RELEASE BILATERAL;  Surgeon: [name], MD;  Location: [place];  Service: Orthopedics;  Laterality: Bilateral;   CESAREAN SECTION  10/29/1993   CESAREAN SECTION WITH BILATERAL TUBAL LIGATION N/A 03/27/2019   Procedure: CESAREAN SECTION WITH BILATERAL TUBAL LIGATION;  Surgeon: Bobbye Charleston, MD;  Location: [place] [facility];  Service: Obstetrics;  Laterality: N/A;   DILITATION & CURRETTAGE/HYSTROSCOPY WITH NOVASURE ABLATION N/A 11/08/2021   Procedure: DILATATION & CURETTAGE/HYSTEROSCOPY WITH NOVASURE ABLATION;  Surgeon: Bobbye Charleston, MD;  Location: [place];  Service: Gynecology;   Laterality: N/A;   EYE SURGERY  09/13/2020   cataract bilateral   GASTRIC ROUX-EN-Y N/A 03/12/2017   Procedure: LAPAROSCOPIC ROUX-EN-Y GASTRIC BYPASS WITH UPPER ENDOSCOPY;  Surgeon: Kieth Brightly, Arta Bruce, MD;  Location: [facility];  Service: General;  Laterality: N/A;   HEMATOMA EVACUATION  12/09/2021   Procedure: EVACUATION HEMATOMA;  Surgeon: Irene Limbo, MD;  Location: [place];  Service: Plastics;;   I & D EXTREMITY N/A 12/09/2021   Procedure: IRRIGATION AND DEBRIDEMENT ABDOMEN;  Surgeon: Irene Limbo, MD;  Location: [place];  Service: Plastics;  Laterality: N/A;   PANNICULECTOMY N/A 12/08/2021   Procedure: PANNICULECTOMY;  Surgeon: Irene Limbo, MD;  Location: [place];  Service: Plastics;  Laterality: N/A;   TUBAL LIGATION  03/27/2019   UPPER GI ENDOSCOPY  03/12/2017   Procedure: UPPER GI ENDOSCOPY;  Surgeon: Kieth Brightly Arta Bruce, MD;  Location: [facility];  Service: General;;   Patient Active Problem List   Diagnosis Date Noted   S/P panniculectomy 12/09/2021   Iron deficiency anemia 06/26/2021   Vaginal bleeding in pregnancy, third trimester 03/11/2019   GBS bacteriuria 01/22/2019   Pregnancy of unknown anatomic location 08/10/2018   Intertrigo 11/22/2017   S/P gastric bypass 04/04/2017   Morbid obesity ([place]) 03/12/2017   Hypothyroidism 09/11/2016   Allergic rhinitis due to pollen 06/29/2015   Anxiety and depression 06/29/2015   Gastroesophageal reflux disease without esophagitis 06/29/2015  OSA on CPAP 06/29/2015   History of syphilis 05/23/2015   Irregular menstrual cycle 11/12/2013   POLYCYSTIC OVARIAN DISEASE 06/20/2009    REFERRING DIAG: N39.3 (ICD-10-CM) - Stress incontinence (female) (female)  THERAPY DIAG:  Unspecified lack of coordination  Abnormal posture  Muscle weakness (generalized)  Other muscle spasm  Rationale for Evaluation and Treatment Rehabilitation  PERTINENT HISTORY: diabetes, Poorly Controlled, Type 2 Hypothyroidism,    Morbid obesity, Anxiety Depression, GERD,  hypertension, gastric bypass 2018, abdominal and bil adductor skin removal   PRECAUTIONS: NA  SUBJECTIVE:                                                                                                                                                                                      SUBJECTIVE STATEMENT:  Pt states that she she is still having a lot of inner thigh tightness, Rt>Lt. Pt states that she has not had any tailbone pain since last visit. She was sitting on hard floor this morning and did not have tailbone pain. She also has not had any pain while driving leading to frequent weight shifting. Pt still feeling a lot of lower abdominal pressure when she stands - swelling is better, but still there.    PAIN:  Are you having pain? Yes: NPRS scale: 1.5/10 Pain location: low back/abdomen Pain description: pressure Aggravating factors: random Relieving factors: nothing  SUBJECTIVE STATEMENT: "Every time I cough or sneeze I pee on myself". Small leakage reported. Also with laughing, exercising. Also urgency with leakage too. Wears pads but causes rash and sometimes leaves toilet paper at underwear. Pt reports she has had 5 day history of vaginal discharge with clear/blood tinged mucus noted and hasn't told gyn yet.    Fluid intake: Yes: water - 2 small bottles. Coffee in morning and sometimes poweraide. Rarely soda.      PAIN:  Are you having pain? Yes NPRS scale: 8/10 Pain location:  anterior low abdomen and low back at the same time   Pain type: cramping, tight  Pain description: intermittent    Aggravating factors: unsure seems random. Before would have pain prior to periods and sometimes afterward but now no longer having periods.  Relieving factors: resting   PRECAUTIONS: None   WEIGHT BEARING RESTRICTIONS: No   FALLS:  Has patient fallen in last 6 months? 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   PLOF: Independent   PATIENT GOALS: to have less leakage   PERTINENT HISTORY:  diabetes, Poorly Controlled, Type 2 Hypothyroidism,   Morbid obesity, Anxiety Depression, GERD,  hypertension, gastric bypass 2018, abdominal and bil adductor skin removal    Sexual abuse: No   BOWEL MOVEMENT: Pain with bowel movement: No Type of bowel movement:Type (Bristol Stool Scale) 4, Frequency daily sometimes 2-3x (largest in morning, others smaller), and Strain No Fully empty rectum: Yes:   Leakage: No Pads: No Fiber supplement: No   URINATION: Pain with urination: No Fully empty bladder: Yes: but sometimes feels like she will go again 30 mins later Stream: Strong Urgency: Yes:   Frequency: with coffee quicker than 2 hours but not usually, not regularly up at night  Leakage: Urge to void, Coughing, Sneezing, Laughing, Exercise, and Lifting Pads: Yes: sometimes   INTERCOURSE: Pain with intercourse:  not active  no painful    PREGNANCY: Vaginal deliveries 1 Tearing No C-section deliveries 2 Currently pregnant No   PROLAPSE: None     OBJECTIVE:   PFIQ-7: 62  11/27/22: PELVIC MMT:   MMT eval  Vaginal 4/5, 10 second endurance, 10 repeat contractions  Internal Anal Sphincter    External Anal Sphincter    Puborectalis    Diastasis Recti   (Blank rows = not tested)         TONE: Posterior levator ani restriction/pain   PROLAPSE: Grade 1 anterior vaginal wall laxity  DIAGNOSTIC FINDINGS:    PFIQ-7 38   COGNITION: Overall cognitive status: Within functional limits for tasks assessed                          SENSATION: Light touch: Appears intact Proprioception: Appears intact   MUSCLE LENGTH: Bil hamstrings and adductors limited by 50%     POSTURE: rounded shoulders, forward head, and posterior pelvic tilt   PELVIC ALIGNMENT:   LUMBARAROM/PROM:   A/PROM A/PROM  eval  Flexion  Decreased by 50% with abdominal pain reported  Extension Decreased by 25%   Right lateral flexion Decreased by 50% with abdominal pain reported  Left lateral flexion Decreased by 50% with abdominal pain reported  Right rotation Decreased by 50% with abdominal pain reported  Left rotation Decreased by 50% with abdominal pain reported   (Blank rows = not tested)   LOWER EXTREMITY ROM:   WFL   LOWER EXTREMITY MMT:   Bil hips 3+/5, knees and ankles 4+/5   PALPATION:   General  TTP throughout midline of abdomen, profoundly tight tissue restrictions throughout midline and low abdominal quadrants and pt has swelling midline with scarring sites post skin removal surgery with transabdominal and bil adductors. Skin changes at scar sites with darkening and very restricted throughout sites in all directions. Midline lower abdominal quadrant most restricted and swollen with stiffness limiting all mobility over bladder. Bil adductors proximally restricted in all directions due to scar restrictions as well and bilaterally pt has areas of scar sites that are red with white dry skin noted and she reports due to friction.                   External Perineal Exam dryness noted externally. Pt reports she has a lot of skin allergies and doesn't know what pads to use and has been using toilet paper in panties for leakage. Pt's vulva and clitoral hood moving well without restrictions does have some labia minor adhesion noted however no redness and tissue WFL.  Internal Pelvic Floor pt denied all TTP   Patient confirms identification and approves PT to assess internal pelvic floor and treatment Yes      TODAY'S TREATMENT Manual: Myofascial release bil adductors Scar tissue mobilization bil inner thighs Exercises: Side lying adduction 2 x 10 bil Happy baby stretch 10 breaths Standing adductor stretch    TREATMENT 12/06/22 Manual: Pt provides verbal consent for internal  vaginal/rectal pelvic floor exam. Intra-rectal exam and treatment Rectal pelvic floor muscle release Coccyx mobilizations/traction   TREATMENT 12/04/22 Manual: Trigger Point Dry-Needling  Treatment instructions: Expect mild to moderate muscle soreness. S/S of pneumothorax if dry needled over a lung field, and to seek immediate medical attention should they occur. Patient verbalized understanding of these instructions and education.  Patient Consent Given: Yes Education handout provided: Previously provided Muscles treated: Lt TFL/glutes Electrical stimulation performed: No Parameters: N/A Treatment response/outcome: complete reduction of pain Soft tissue mobilization to bil lumbar paraspinals/obliques Exercises: Lower trunk rotation 2 x 10 Supine piriformis stretch 2 x 60 sec Lt   PATIENT EDUCATION:  Education details: scar mobility  Person educated: Patient Education method: Consulting civil engineer, Demonstration, Tactile cues, Verbal cues, and Handouts Education comprehension: verbalized understanding and returned demonstration   HOME EXERCISE PROGRAM: [email or web address]    ASSESSMENT:   CLINICAL IMPRESSION: Pt still having a lot a lot of lower abdominal pressure; swelling is still present in this area, and settling over mons pubis. She tolerated last treatment session very well with complete resolution of coccydynia since last week. Focus on inner thigh discomfort today due to increased pain over the last week from scar tissue restriction. She tolerated manual techniques well. Side lying hip adduction utilized to further help mobilize adductor scar tissue. Pt reported significant improvement in tightness at end of session. She will continue to benefit from skilled PT intervention in order to increase functional pelvic floor strength and improve coccyx/low back/abdominal pain.    OBJECTIVE IMPAIRMENTS: decreased coordination, decreased endurance, decreased mobility, decreased strength, increased  fascial restrictions, increased muscle spasms, impaired flexibility, improper body mechanics, postural dysfunction, and pain.    ACTIVITY LIMITATIONS: carrying, lifting, squatting, continence, and locomotion level   PARTICIPATION LIMITATIONS: community activity   PERSONAL FACTORS: Fitness, Time since onset of injury/illness/exacerbation, and 1 comorbidity: medical history   are also affecting patient's functional outcome.    REHAB POTENTIAL: Good   CLINICAL DECISION MAKING: Stable/uncomplicated   EVALUATION COMPLEXITY: Low     GOALS: Goals reviewed with patient? Yes   SHORT TERM GOALS: Target date: 10/07/22 - updated 10/24/22 updated 11/27/22   Pt to be I with HEP.  Baseline: Goal status:  MET 11/27/22   2.  Pt to be I with scar mobility and diaphragmatic breathing for improved abdominal mobility and decreased tissue restrictions.  Baseline:  Goal status:  MET 11/27/22   3.  Pt to demonstrate improved coordination with pelvic floor and breathing mechanics with body weight squats for pelvic stability and decreased leakage.  Baseline:  Goal status:  MET 11/27/22     LONG TERM GOALS: Target date: 01/09/23   Pt to be I with advanced HEP.  Baseline:  Goal status: IN PROGRESS   2.  Pt to demonstrate improved coordination with pelvic floor and breathing mechanics with 20#  squats for pelvic stability and decreased leakage.  Baseline:  Goal status:  IN PROGRESS   3.  Pt to report no more than 1 instance of urinary leakage per month for improved QOL and skin integrity.  Baseline:  Goal  status:  IN PROGRESS   4.  Pt will have 50% less urgency due to bladder retraining and strengthening  Baseline:  Goal status:  MET 11/27/22   5.  Pt to report improved time between bladder voids to at least 2.5 hours with increase in fluid intake for improved QOL with decreased urinary frequency.   Baseline:  Goal status:  IN PROGRESS   6.  Pt to demonstrate improved thoracic and lumbar mobility  to [facility] of ROM for improved mobility at spine and allow more mobility at abdomen.  Baseline:  Goal status:  IN PROGRESS   PLAN:   PT FREQUENCY: 2x/week   PT DURATION:  16 sessions   PLANNED INTERVENTIONS: Therapeutic exercises, Therapeutic activity, Neuromuscular re-education, Patient/Family education, Self Care, Joint mobilization, Aquatic Therapy, Dry Needling, Electrical stimulation, Spinal mobilization, Cryotherapy, Moist heat, scar mobilization, Taping, Traction, Biofeedback, and Manual therapy   PLAN FOR NEXT SESSION: continue manual techniques; progress strengthening and mobility.    Heather Roberts, PT, DPT02/13/2410:05 AM

## 2022-12-11 NOTE — Pre-Procedure Instructions (Signed)
Surgical Instructions    Your procedure is scheduled on Monday 12/17/22.   Report to [facility] Main Entrance "A" at 05:30 A.M., then check in with the Admitting office.  Call this number if you have problems the morning of surgery:  [phone]   If you have any questions prior to your surgery date call [phone]: Open Monday-Friday 8am-4pm If you experience any cold or flu symptoms such as cough, fever, chills, shortness of breath, etc. between now and your scheduled surgery, please notify us at the above number     Remember:  Do not eat after midnight the night before your surgery  You may drink clear liquids until 04:15 A.M. the morning of your surgery.   Clear liquids allowed are: Water, Non-Citrus Juices (without pulp), Carbonated Beverages, Clear Tea, Black Coffee ONLY (NO MILK, CREAM OR POWDERED CREAMER of any kind), and Gatorade    Take these medicines the morning of surgery with A SIP OF WATER:   amLODipine (NORVASC)   levothyroxine (SYNTHROID, LEVOTHROID)   JENCYCLA     Take these medicines if needed:   FLUoxetine (PROZAC)   fluticasone (FLONASE)    As of today, STOP taking any Aspirin (unless otherwise instructed by your surgeon) Aleve, Naproxen, Ibuprofen, Motrin, Advil, Goody's, BC's, all herbal medications, fish oil, and all vitamins.           Do not wear jewelry or makeup. Do not wear lotions, powders, perfumes/cologne or deodorant. Do not shave 48 hours prior to surgery.  Men may shave face and neck. Do not bring valuables to the hospital. Do not wear nail polish, gel polish, artificial nails, or any other type of covering on natural nails (fingers and toes) If you have artificial nails or gel coating that need to be removed by a nail salon, please have this removed prior to surgery. Artificial nails or gel coating may interfere with anesthesia's ability to adequately monitor your vital signs.  [facility] is not responsible for any belongings or  valuables.    Do NOT Smoke (Tobacco/Vaping)  24 hours prior to your procedure  If you use a CPAP at night, you may bring your mask for your overnight stay.   Contacts, glasses, hearing aids, dentures or partials may not be worn into surgery, please bring cases for these belongings   For patients admitted to the hospital, discharge time will be determined by your treatment team.   Patients discharged the day of surgery will not be allowed to drive home, and someone needs to stay with them for 24 hours.   SURGICAL WAITING ROOM VISITATION Patients having surgery or a procedure may have no more than 2 support people in the waiting area - these visitors may rotate.   Children under the age of 4 must have an adult with them who is not the patient. If the patient needs to stay at the hospital during part of their recovery, the visitor guidelines for inpatient rooms apply. Pre-op nurse will coordinate an appropriate time for 1 support person to accompany patient in pre-op.  This support person may not rotate.   Please refer to [email or web address] for the visitor guidelines for Inpatients (after your surgery is over and you are in a regular room).    Special instructions:    Oral Hygiene is also important to reduce your risk of infection.  Remember - BRUSH YOUR TEETH THE MORNING OF SURGERY WITH YOUR REGULAR TOOTHPASTE   [place]- Preparing For Surgery  Before surgery, you can  play an important role. Because skin is not sterile, your skin needs to be as free of germs as possible. You can reduce the number of germs on your skin by washing with CHG (chlorahexidine gluconate) Soap before surgery.  CHG is an antiseptic cleaner which kills germs and bonds with the skin to continue killing germs even after washing.     Please do not use if you have an allergy to CHG or antibacterial soaps. If your skin becomes reddened/irritated stop using the  CHG.  Do not shave (including legs and underarms) for at least 48 hours prior to first CHG shower. It is OK to shave your face.  Please follow these instructions carefully.     Shower the NIGHT BEFORE SURGERY and the MORNING OF SURGERY with CHG Soap.   If you chose to wash your hair, wash your hair first as usual with your normal shampoo. After you shampoo, rinse your hair and body thoroughly to remove the shampoo.  Then [employer] and genitals (private parts) with your normal soap and rinse thoroughly to remove soap.  After that Use CHG Soap as you would any other liquid soap. You can apply CHG directly to the skin and wash gently with a scrungie or a clean washcloth.   Apply the CHG Soap to your body ONLY FROM THE NECK DOWN.  Do not use on open wounds or open sores. Avoid contact with your eyes, ears, mouth and genitals (private parts). Wash Face and genitals (private parts)  with your normal soap.   Wash thoroughly, paying special attention to the area where your surgery will be performed.  Thoroughly rinse your body with warm water from the neck down.  DO NOT shower/wash with your normal soap after using and rinsing off the CHG Soap.  Pat yourself dry with a CLEAN TOWEL.  Wear CLEAN PAJAMAS to bed the night before surgery  Place CLEAN SHEETS on your bed the night before your surgery  DO NOT SLEEP WITH PETS.   Day of Surgery:  Take a shower with CHG soap. Wear Clean/Comfortable clothing the morning of surgery Do not apply any deodorants/lotions.   Remember to brush your teeth WITH YOUR REGULAR TOOTHPASTE.    If you received a COVID test during your pre-op visit, it is requested that you wear a mask when out in public, stay away from anyone that may not be feeling well, and notify your surgeon if you develop symptoms. If you have been in contact with anyone that has tested positive in the last 10 days, please notify your surgeon.    Please read over the following fact sheets  that you were given.

## 2022-12-12 ENCOUNTER — Encounter (HOSPITAL_COMMUNITY): Payer: Self-pay

## 2022-12-12 ENCOUNTER — Other Ambulatory Visit: Payer: Self-pay

## 2022-12-12 ENCOUNTER — Encounter (HOSPITAL_COMMUNITY)
Admission: RE | Admit: 2022-12-12 | Discharge: 2022-12-12 | Disposition: A | Discharge status: Home / Self Care | Payer: Medicaid Other | Source: Ambulatory Visit | Attending: Plastic Surgery | Admitting: Plastic Surgery

## 2022-12-12 VITALS — BP 177/91 | HR 63 | Temp 98.5°F | Resp 17 | Ht 62.0 in | Wt 214.0 lb

## 2022-12-12 DIAGNOSIS — Z79899 Other long term (current) drug therapy: Secondary | ICD-10-CM | POA: Insufficient documentation

## 2022-12-12 DIAGNOSIS — Z01812 Encounter for preprocedural laboratory examination: Secondary | ICD-10-CM | POA: Diagnosis present

## 2022-12-12 DIAGNOSIS — E119 Type 2 diabetes mellitus without complications: Secondary | ICD-10-CM | POA: Diagnosis not present

## 2022-12-12 DIAGNOSIS — I10 Essential (primary) hypertension: Secondary | ICD-10-CM | POA: Diagnosis not present

## 2022-12-12 DIAGNOSIS — G4733 Obstructive sleep apnea (adult) (pediatric): Secondary | ICD-10-CM | POA: Diagnosis not present

## 2022-12-12 DIAGNOSIS — Z01818 Encounter for other preprocedural examination: Secondary | ICD-10-CM

## 2022-12-12 LAB — BASIC METABOLIC PANEL
Anion gap: 8 (ref 5–15)
BUN: 13 mg/dL (ref 6–20)
CO2: 29 mmol/L (ref 22–32)
Calcium: 9 mg/dL (ref 8.9–10.3)
Chloride: 99 mmol/L (ref 98–111)
Creatinine, Ser: 0.89 mg/dL (ref 0.44–1.00)
GFR, Estimated: >60 mL/min (ref >60)
Glucose, Bld: 103 mg/dL — ABNORMAL HIGH (ref 70–99)
Potassium: 3.2 mmol/L — ABNORMAL LOW (ref 3.5–5.1)
Sodium: 136 mmol/L (ref 135–145)

## 2022-12-12 LAB — GLUCOSE, CAPILLARY: Glucose-Capillary: 101 mg/dL — ABNORMAL HIGH (ref 70–99)

## 2022-12-12 LAB — HEMOGLOBIN A1C
Hgb A1c MFr Bld: 5.6 % (ref 4.8–5.6)
Mean Plasma Glucose: 114.02 mg/dL

## 2022-12-12 NOTE — Progress Notes (Signed)
PCP - Dr. Reginia Forts Cardiologist - denies  PPM/ICD - denies   Chest x-ray - 12/09/21 EKG - 08/18/22 Stress Test - denies ECHO - denies Cardiac Cath - denies  Sleep Study - 04/11/15- OSA+- Pt had gastric bypass surgery in 2018 and states she has not needed a CPAP since   CPAP - denies  Pt states she does not check CBG at home, she says her typical fasting levels are around 80-100   Last dose of GLP1 agonist-  n/a   ASA/Blood Thinner Instructions: n/a   ERAS Protcol - yes, no drink   COVID TEST- n/a   Anesthesia review: yes, per MD order. Pt had surgery cancelled before due to HTN issues. Pt instructed to take ALL BP meds including lisinopril-hydrochlorothiazide (ZESTORETIC) on DOS, per anesthesia.  Patient denies shortness of breath, fever, cough and chest pain at PAT appointment   All instructions explained to the patient, with a verbal understanding of the material. Patient agrees to go over the instructions while at home for a better understanding.  The opportunity to ask questions was provided.

## 2022-12-12 NOTE — Pre-Procedure Instructions (Signed)
Surgical Instructions    Your procedure is scheduled on Monday 12/17/22.   Report to [facility] Main Entrance "A" at 05:30 A.M., then check in with the Admitting office.  Call this number if you have problems the morning of surgery:  [phone]   If you have any questions prior to your surgery date call [phone]: Open Monday-Friday 8am-4pm If you experience any cold or flu symptoms such as cough, fever, chills, shortness of breath, etc. between now and your scheduled surgery, please notify us at the above number     Remember:  Do not eat after midnight the night before your surgery  You may drink clear liquids until 04:15 A.M. the morning of your surgery.   Clear liquids allowed are: Water, Non-Citrus Juices (without pulp), Carbonated Beverages, Clear Tea, Black Coffee ONLY (NO MILK, CREAM OR POWDERED CREAMER of any kind), and Gatorade    Take these medicines the morning of surgery with A SIP OF WATER:   amLODipine (NORVASC)   levothyroxine (SYNTHROID, LEVOTHROID)   JENCYCLA  lisinopril-hydrochlorothiazide (ZESTORETIC)    Take these medicines if needed:   FLUoxetine (PROZAC)   fluticasone (FLONASE)    As of today, STOP taking any Aspirin (unless otherwise instructed by your surgeon) Aleve, Naproxen, Ibuprofen, Motrin, Advil, Goody's, BC's, all herbal medications, fish oil, and all vitamins.           Do not wear jewelry or makeup. Do not wear lotions, powders, perfumes/cologne or deodorant. Do not shave 48 hours prior to surgery.  Men may shave face and neck. Do not bring valuables to the hospital. Do not wear nail polish, gel polish, artificial nails, or any other type of covering on natural nails (fingers and toes) If you have artificial nails or gel coating that need to be removed by a nail salon, please have this removed prior to surgery. Artificial nails or gel coating may interfere with anesthesia's ability to adequately monitor your vital signs.  [facility] is not  responsible for any belongings or valuables.    Do NOT Smoke (Tobacco/Vaping)  24 hours prior to your procedure  If you use a CPAP at night, you may bring your mask for your overnight stay.   Contacts, glasses, hearing aids, dentures or partials may not be worn into surgery, please bring cases for these belongings   For patients admitted to the hospital, discharge time will be determined by your treatment team.   Patients discharged the day of surgery will not be allowed to drive home, and someone needs to stay with them for 24 hours.   SURGICAL WAITING ROOM VISITATION Patients having surgery or a procedure may have no more than 2 support people in the waiting area - these visitors may rotate.   Children under the age of 20 must have an adult with them who is not the patient. If the patient needs to stay at the hospital during part of their recovery, the visitor guidelines for inpatient rooms apply. Pre-op nurse will coordinate an appropriate time for 1 support person to accompany patient in pre-op.  This support person may not rotate.   Please refer to [email or web address] for the visitor guidelines for Inpatients (after your surgery is over and you are in a regular room).    Special instructions:    Oral Hygiene is also important to reduce your risk of infection.  Remember - BRUSH YOUR TEETH THE MORNING OF SURGERY WITH YOUR REGULAR TOOTHPASTE   [place]- Preparing For Surgery  Before surgery,  you can play an important role. Because skin is not sterile, your skin needs to be as free of germs as possible. You can reduce the number of germs on your skin by washing with CHG (chlorahexidine gluconate) Soap before surgery.  CHG is an antiseptic cleaner which kills germs and bonds with the skin to continue killing germs even after washing.     Please do not use if you have an allergy to CHG or antibacterial soaps. If your skin becomes  reddened/irritated stop using the CHG.  Do not shave (including legs and underarms) for at least 48 hours prior to first CHG shower. It is OK to shave your face.  Please follow these instructions carefully.     Shower the NIGHT BEFORE SURGERY and the MORNING OF SURGERY with CHG Soap.   If you chose to wash your hair, wash your hair first as usual with your normal shampoo. After you shampoo, rinse your hair and body thoroughly to remove the shampoo.  Then [employer] and genitals (private parts) with your normal soap and rinse thoroughly to remove soap.  After that Use CHG Soap as you would any other liquid soap. You can apply CHG directly to the skin and wash gently with a scrungie or a clean washcloth.   Apply the CHG Soap to your body ONLY FROM THE NECK DOWN.  Do not use on open wounds or open sores. Avoid contact with your eyes, ears, mouth and genitals (private parts). Wash Face and genitals (private parts)  with your normal soap.   Wash thoroughly, paying special attention to the area where your surgery will be performed.  Thoroughly rinse your body with warm water from the neck down.  DO NOT shower/wash with your normal soap after using and rinsing off the CHG Soap.  Pat yourself dry with a CLEAN TOWEL.  Wear CLEAN PAJAMAS to bed the night before surgery  Place CLEAN SHEETS on your bed the night before your surgery  DO NOT SLEEP WITH PETS.   Day of Surgery:  Take a shower with CHG soap. Wear Clean/Comfortable clothing the morning of surgery Do not apply any deodorants/lotions.   Remember to brush your teeth WITH YOUR REGULAR TOOTHPASTE.    If you received a COVID test during your pre-op visit, it is requested that you wear a mask when out in public, stay away from anyone that may not be feeling well, and notify your surgeon if you develop symptoms. If you have been in contact with anyone that has tested positive in the last 10 days, please notify your surgeon.    Please  read over the following fact sheets that you were given.

## 2022-12-13 NOTE — Progress Notes (Signed)
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   Patient originally scheduled for this procedure on 09/07/2022 at [facility] surgery center.  Procedure was canceled at that time due to multiple concerns.  Principally, patient was markedly hypertensive with systolic pressures well over 200 and also had poor targets for peripheral IV access.  This is well-documented in Dr. Lawerance Cruel note 09/07/2022.  Patient was advised to take all antihypertensive medications including lisinopril-HCTZ on subsequent day of surgery when case rescheduled.  Per note, "In the future, this patient would be a better candidate for [facility] instead of a surgery center due to access issues, likely untreated OSA, morbid obesity and uncontrolled hypertension. Blood sugar was not checked today. I think if IJ central venous access is going to be required for the case, she may benefit from induction of GA with a peripheral IV and then an asleep neck line. Based on today's encounter, I do not think she will tolerate an awake neck line."  BMP 12/12/2022 reviewed, mild hypokalemia with potassium 3.2.  Patient had CBC done at OB/GYN office 12/04/2022, results not available in epic and have been requested for review.  EKG 08/20/2022: NSR.  Rate 76.  Nonspecific ST abnormality.    Wynonia Musty [facility] Short Stay Center/Anesthesiology Phone [phone] 12/13/2022 10:48 AM

## 2022-12-14 NOTE — Progress Notes (Signed)
Anesthesia APP Follow-up: See Previous APP note by Karoline Caldwell, PA-C.   CBC was requested from [facility]. Results from 12/04/22 received showing: WBC 7.7, hemoglobin 14.0, hematocrit 43.7, platelet count 358.  Myra Gianotti, PA-C Surgical Short Stay/Anesthesiology [facility] Phone [phone] [facility] Phone [phone] 12/14/2022 2:35 PM

## 2022-12-14 NOTE — Anesthesia Preprocedure Evaluation (Signed)
Anesthesia Evaluation  Patient identified by MRN, date of birth, ID band Patient awake    Reviewed: Allergy & Precautions, NPO status , Patient's Chart, lab work & pertinent test results  History of Anesthesia Complications Negative for: history of anesthetic complications  Airway Mallampati: III  TM Distance: >3 FB Neck ROM: Full   Comment: Previous grade I view with MAC 3, easy mask Dental  (+) Dental Advisory Given,    Pulmonary neg shortness of breath, sleep apnea (does not use CPAP, reports no longer an issue after weight loss) , neg COPD, neg recent URI   Pulmonary exam normal breath sounds clear to auscultation       Cardiovascular hypertension (lisinopril-HCTZ, amlodipine), Pt. on medications (-) angina (-) Past MI, (-) Cardiac Stents and (-) CABG (-) dysrhythmias  Rhythm:Regular Rate:Normal     Neuro/Psych neg Seizures PSYCHIATRIC DISORDERS Anxiety Depression       GI/Hepatic Neg liver ROS,GERD  ,,S/p gastric roux-en-y   Endo/Other  diabetes (Hgb A1c 5.6, no longer on medication after weight loss), Well Controlled, Type 2Hypothyroidism  PCOS  Renal/GU negative Renal ROS     Musculoskeletal   Abdominal  (+) + obese  Peds  Hematology  (+) Blood dyscrasia (iron deficiency), anemia   Anesthesia Other Findings K 3.2  Reproductive/Obstetrics                             Anesthesia Physical Anesthesia Plan  ASA: 3  Anesthesia Plan: General   Post-op Pain Management: Tylenol PO (pre-op)*   Induction: Intravenous  PONV Risk Score and Plan: 3 and Ondansetron, Dexamethasone, Treatment may vary due to age or medical condition and Midazolam  Airway Management Planned: Oral ETT  Additional Equipment:   Intra-op Plan:   Post-operative Plan: Extubation in OR  Informed Consent: I have reviewed the patients History and Physical, chart, labs and discussed the procedure including the  risks, benefits and alternatives for the proposed anesthesia with the patient or authorized representative who has indicated his/her understanding and acceptance.     Dental advisory given  Plan Discussed with: CRNA and Anesthesiologist  Anesthesia Plan Comments: (Risks of general anesthesia discussed including, but not limited to, sore throat, hoarse voice, chipped/damaged teeth, injury to vocal cords, nausea and vomiting, allergic reactions, lung infection, heart attack, stroke, and death. All questions answered.   See note by Karoline Caldwell, PA-C. CBC was requested from [facility]. Results from 12/04/22 received showing: WBC 7.7, hemoglobin 14.0, hematocrit 43.7, platelet count 358. )       Anesthesia Quick Evaluation

## 2022-12-17 ENCOUNTER — Encounter (HOSPITAL_COMMUNITY)
Admission: RE | Disposition: A | Discharge status: Home / Self Care | Payer: Self-pay | Source: Home / Self Care | Attending: Plastic Surgery

## 2022-12-17 ENCOUNTER — Encounter (HOSPITAL_COMMUNITY): Payer: Self-pay | Admitting: Plastic Surgery

## 2022-12-17 ENCOUNTER — Other Ambulatory Visit: Payer: Self-pay

## 2022-12-17 ENCOUNTER — Ambulatory Visit (HOSPITAL_COMMUNITY): Payer: Medicaid Other | Admitting: Physician Assistant

## 2022-12-17 ENCOUNTER — Ambulatory Visit (HOSPITAL_COMMUNITY)
Admission: RE | Admit: 2022-12-17 | Discharge: 2022-12-17 | Disposition: A | Discharge status: Home / Self Care | Payer: Medicaid Other | Attending: Plastic Surgery | Admitting: Plastic Surgery

## 2022-12-17 ENCOUNTER — Ambulatory Visit (HOSPITAL_BASED_OUTPATIENT_CLINIC_OR_DEPARTMENT_OTHER): Payer: Medicaid Other | Admitting: Vascular Surgery

## 2022-12-17 DIAGNOSIS — L304 Erythema intertrigo: Secondary | ICD-10-CM

## 2022-12-17 DIAGNOSIS — D509 Iron deficiency anemia, unspecified: Secondary | ICD-10-CM | POA: Diagnosis not present

## 2022-12-17 DIAGNOSIS — E119 Type 2 diabetes mellitus without complications: Secondary | ICD-10-CM

## 2022-12-17 DIAGNOSIS — I10 Essential (primary) hypertension: Secondary | ICD-10-CM

## 2022-12-17 DIAGNOSIS — Z9884 Bariatric surgery status: Secondary | ICD-10-CM | POA: Diagnosis not present

## 2022-12-17 DIAGNOSIS — Z01818 Encounter for other preprocedural examination: Secondary | ICD-10-CM

## 2022-12-17 HISTORY — PX: LIPECTOMY: SHX11

## 2022-12-17 LAB — GLUCOSE, CAPILLARY
Glucose-Capillary: 79 mg/dL (ref 70–99)
Glucose-Capillary: 116 mg/dL — ABNORMAL HIGH (ref 70–99)

## 2022-12-17 LAB — POCT PREGNANCY, URINE: Preg Test, Ur: NEGATIVE

## 2022-12-17 SURGERY — LIPECTOMY
Anesthesia: General | Site: Arm Upper | Laterality: Bilateral

## 2022-12-17 MED ORDER — DEXAMETHASONE SODIUM PHOSPHATE 10 MG/ML IJ SOLN
INTRAMUSCULAR | Status: DC | PRN
  Administered 2022-12-17: 5 mg via INTRAVENOUS

## 2022-12-17 MED ORDER — PROPOFOL 10 MG/ML IV BOLUS
INTRAVENOUS | Status: DC | PRN
  Administered 2022-12-17: 200 mg via INTRAVENOUS

## 2022-12-17 MED ORDER — CHLORHEXIDINE GLUCONATE 0.12 % MT SOLN
OROMUCOSAL | Status: AC
  Administered 2022-12-17: 15 mL via OROMUCOSAL
  Filled 2022-12-17: qty 15

## 2022-12-17 MED ORDER — PROPOFOL 10 MG/ML IV BOLUS
INTRAVENOUS | Status: AC
  Filled 2022-12-17: qty 20

## 2022-12-17 MED ORDER — CHLORHEXIDINE GLUCONATE CLOTH 2 % EX PADS: 6.0000 | MEDICATED_PAD | Freq: Once | CUTANEOUS | Status: DC

## 2022-12-17 MED ORDER — 0.9 % SODIUM CHLORIDE (POUR BTL) OPTIME
TOPICAL | Status: DC | PRN
  Administered 2022-12-17: 1000 mL

## 2022-12-17 MED ORDER — ROCURONIUM BROMIDE 10 MG/ML (PF) SYRINGE
PREFILLED_SYRINGE | INTRAVENOUS | Status: AC
  Filled 2022-12-17: qty 10

## 2022-12-17 MED ORDER — BUPIVACAINE HCL (PF) 0.25 % IJ SOLN
INTRAMUSCULAR | Status: AC
  Filled 2022-12-17: qty 30

## 2022-12-17 MED ORDER — FENTANYL CITRATE (PF) 250 MCG/5ML IJ SOLN
INTRAMUSCULAR | Status: DC | PRN
  Administered 2022-12-17: 25 ug via INTRAVENOUS
  Administered 2022-12-17 (×2): 50 ug via INTRAVENOUS
  Administered 2022-12-17: 25 ug via INTRAVENOUS
  Administered 2022-12-17: 50 ug via INTRAVENOUS

## 2022-12-17 MED ORDER — LIDOCAINE 2% (20 MG/ML) 5 ML SYRINGE
INTRAMUSCULAR | Status: DC | PRN
  Administered 2022-12-17: 80 mg via INTRAVENOUS

## 2022-12-17 MED ORDER — ROCURONIUM BROMIDE 10 MG/ML (PF) SYRINGE
PREFILLED_SYRINGE | INTRAVENOUS | Status: DC | PRN
  Administered 2022-12-17: 50 mg via INTRAVENOUS
  Administered 2022-12-17: 10 mg via INTRAVENOUS

## 2022-12-17 MED ORDER — OXYCODONE HCL 5 MG/5ML PO SOLN
5.0000 mg | Freq: Once | ORAL | Status: AC | PRN
  Administered 2022-12-17: 5 mg via ORAL

## 2022-12-17 MED ORDER — TRANEXAMIC ACID-NACL 1000-0.7 MG/100ML-% IV SOLN
INTRAVENOUS | Status: AC
  Filled 2022-12-17: qty 100

## 2022-12-17 MED ORDER — ACETAMINOPHEN 500 MG PO TABS
1000.0000 mg | ORAL_TABLET | ORAL | Status: AC
  Administered 2022-12-17: 1000 mg via ORAL

## 2022-12-17 MED ORDER — MIDAZOLAM HCL 2 MG/2ML IJ SOLN
INTRAMUSCULAR | Status: DC | PRN
  Administered 2022-12-17: 2 mg via INTRAVENOUS

## 2022-12-17 MED ORDER — PHENYLEPHRINE HCL-NACL 20-0.9 MG/250ML-% IV SOLN
INTRAVENOUS | Status: DC | PRN
  Administered 2022-12-17: 50 ug/min via INTRAVENOUS

## 2022-12-17 MED ORDER — CELECOXIB 200 MG PO CAPS
ORAL_CAPSULE | ORAL | Status: AC
  Filled 2022-12-17: qty 1

## 2022-12-17 MED ORDER — LIDOCAINE HCL 1 % IJ SOLN: 1000.0000 mL | Freq: Once | INTRAVENOUS | Status: DC

## 2022-12-17 MED ORDER — ONDANSETRON HCL 4 MG/2ML IJ SOLN
INTRAMUSCULAR | Status: AC
  Filled 2022-12-17: qty 2

## 2022-12-17 MED ORDER — CEFAZOLIN SODIUM-DEXTROSE 2-4 GM/100ML-% IV SOLN
2.0000 g | INTRAVENOUS | Status: AC
  Administered 2022-12-17: 2 g via INTRAVENOUS

## 2022-12-17 MED ORDER — FENTANYL CITRATE (PF) 250 MCG/5ML IJ SOLN
INTRAMUSCULAR | Status: AC
  Filled 2022-12-17: qty 5

## 2022-12-17 MED ORDER — OXYCODONE HCL 5 MG PO TABS: 5.0000 mg | ORAL_TABLET | Freq: Once | ORAL | Status: AC | PRN

## 2022-12-17 MED ORDER — BUPIVACAINE HCL 0.25 % IJ SOLN
INTRAMUSCULAR | Status: DC | PRN
  Administered 2022-12-17 (×2): 15 mL

## 2022-12-17 MED ORDER — DIPHENHYDRAMINE HCL 50 MG/ML IJ SOLN
INTRAMUSCULAR | Status: DC | PRN
  Administered 2022-12-17: 12.5 mg via INTRAVENOUS

## 2022-12-17 MED ORDER — OXYCODONE HCL 5 MG/5ML PO SOLN
ORAL | Status: AC
  Filled 2022-12-17: qty 5

## 2022-12-17 MED ORDER — ONDANSETRON HCL 4 MG/2ML IJ SOLN
INTRAMUSCULAR | Status: DC | PRN
  Administered 2022-12-17: 4 mg via INTRAVENOUS

## 2022-12-17 MED ORDER — LACTATED RINGERS IV SOLN: INTRAVENOUS | Status: DC

## 2022-12-17 MED ORDER — CELECOXIB 200 MG PO CAPS
200.0000 mg | ORAL_CAPSULE | ORAL | Status: AC
  Administered 2022-12-17: 200 mg via ORAL

## 2022-12-17 MED ORDER — EPHEDRINE SULFATE-NACL 50-0.9 MG/10ML-% IV SOSY
PREFILLED_SYRINGE | INTRAVENOUS | Status: DC | PRN
  Administered 2022-12-17 (×2): 10 mg via INTRAVENOUS
  Administered 2022-12-17: 5 mg via INTRAVENOUS

## 2022-12-17 MED ORDER — ORAL CARE MOUTH RINSE: 15.0000 mL | Freq: Once | OROMUCOSAL | Status: AC

## 2022-12-17 MED ORDER — DEXAMETHASONE SODIUM PHOSPHATE 10 MG/ML IJ SOLN
INTRAMUSCULAR | Status: AC
  Filled 2022-12-17: qty 1

## 2022-12-17 MED ORDER — SODIUM BICARBONATE 4.2 % IV SOLN
Freq: Once | INTRAVENOUS | Status: DC
  Filled 2022-12-17: qty 50

## 2022-12-17 MED ORDER — INSULIN ASPART 100 UNIT/ML IJ SOLN: 0.0000 [IU] | INTRAMUSCULAR | Status: DC | PRN

## 2022-12-17 MED ORDER — CHLORHEXIDINE GLUCONATE 0.12 % MT SOLN: 15.0000 mL | Freq: Once | OROMUCOSAL | Status: AC

## 2022-12-17 MED ORDER — PROMETHAZINE HCL 25 MG/ML IJ SOLN: 6.2500 mg | INTRAMUSCULAR | Status: DC | PRN

## 2022-12-17 MED ORDER — MIDAZOLAM HCL 2 MG/2ML IJ SOLN
INTRAMUSCULAR | Status: AC
  Filled 2022-12-17: qty 2

## 2022-12-17 MED ORDER — TRANEXAMIC ACID-NACL 1000-0.7 MG/100ML-% IV SOLN
1000.0000 mg | INTRAVENOUS | Status: AC
  Administered 2022-12-17: 1000 mg via INTRAVENOUS

## 2022-12-17 MED ORDER — LIDOCAINE 2% (20 MG/ML) 5 ML SYRINGE
INTRAMUSCULAR | Status: AC
  Filled 2022-12-17: qty 5

## 2022-12-17 MED ORDER — BUPIVACAINE LIPOSOME 1.3 % IJ SUSP
INTRAMUSCULAR | Status: AC
  Filled 2022-12-17: qty 20

## 2022-12-17 MED ORDER — CEFAZOLIN SODIUM-DEXTROSE 2-4 GM/100ML-% IV SOLN
INTRAVENOUS | Status: AC
  Filled 2022-12-17: qty 100

## 2022-12-17 MED ORDER — DIPHENHYDRAMINE HCL 50 MG/ML IJ SOLN
INTRAMUSCULAR | Status: AC
  Filled 2022-12-17: qty 1

## 2022-12-17 MED ORDER — FENTANYL CITRATE (PF) 100 MCG/2ML IJ SOLN
25.0000 ug | INTRAMUSCULAR | Status: DC | PRN
  Administered 2022-12-17 (×2): 50 ug via INTRAVENOUS

## 2022-12-17 MED ORDER — PHENYLEPHRINE HCL (PRESSORS) 10 MG/ML IV SOLN
INTRAVENOUS | Status: DC | PRN
  Administered 2022-12-17: 80 ug via INTRAVENOUS

## 2022-12-17 MED ORDER — FENTANYL CITRATE (PF) 100 MCG/2ML IJ SOLN
INTRAMUSCULAR | Status: AC
  Filled 2022-12-17: qty 2

## 2022-12-17 MED ORDER — ACETAMINOPHEN 500 MG PO TABS
ORAL_TABLET | ORAL | Status: AC
  Filled 2022-12-17: qty 2

## 2022-12-17 SURGICAL SUPPLY — 50 items
ADH SKN CLS APL DERMABOND .7 (GAUZE/BANDAGES/DRESSINGS) ×2
APL PRP STRL LF DISP 70% ISPRP (MISCELLANEOUS) ×2
APPLIER CLIP 9.375 MED OPEN (MISCELLANEOUS)
APR CLP MED 9.3 20 MLT OPN (MISCELLANEOUS)
BLADE SURG 10 STRL SS (BLADE) IMPLANT
BNDG ELASTIC 6X5.8 VLCR STR LF (GAUZE/BANDAGES/DRESSINGS) ×2 IMPLANT
CANISTER SUCT 3000ML PPV (MISCELLANEOUS) ×1 IMPLANT
CHLORAPREP W/TINT 26 (MISCELLANEOUS) ×3 IMPLANT
CLIP APPLIE 9.375 MED OPEN (MISCELLANEOUS) ×1 IMPLANT
DERMABOND ADVANCED .7 DNX12 (GAUZE/BANDAGES/DRESSINGS) ×2 IMPLANT
DRAIN JP 15F RND TROCAR (DRAIN) IMPLANT
DRAPE HALF SHEET 40X57 (DRAPES) ×2 IMPLANT
DRAPE ORTHO SPLIT 77X108 STRL (DRAPES) ×2
DRAPE SURG ORHT 6 SPLT 77X108 (DRAPES) ×2 IMPLANT
DRAPE TOP ARMCOVERS (MISCELLANEOUS) IMPLANT
DRAPE UTILITY XL STRL (DRAPES) ×1 IMPLANT
ELECT CAUTERY BLADE 6.4 (BLADE) IMPLANT
ELECT COATED BLADE 2.86 ST (ELECTRODE) ×1 IMPLANT
ELECT REM PT RETURN 9FT ADLT (ELECTROSURGICAL) ×1
ELECTRODE REM PT RTRN 9FT ADLT (ELECTROSURGICAL) ×1 IMPLANT
EVACUATOR SILICONE 100CC (DRAIN) ×2 IMPLANT
GAUZE 4X4 16PLY ~~LOC~~+RFID DBL (SPONGE) IMPLANT
GAUZE PAD ABD 8X10 STRL (GAUZE/BANDAGES/DRESSINGS) IMPLANT
GLOVE BIOGEL PI IND STRL 6.5 (GLOVE) IMPLANT
GLOVE BIOGEL PI IND STRL 7.0 (GLOVE) IMPLANT
GLOVE BIOGEL PI MICRO STRL 6 (GLOVE) IMPLANT
GOWN STRL REUS W/ TWL LRG LVL3 (GOWN DISPOSABLE) ×1 IMPLANT
GOWN STRL REUS W/TWL LRG LVL3 (GOWN DISPOSABLE) ×3
HEMOSTAT ARISTA ABSORB 3G PWDR (HEMOSTASIS) IMPLANT
NDL HYPO 25GX1X1/2 BEV (NEEDLE) IMPLANT
NEEDLE HYPO 25GX1X1/2 BEV (NEEDLE) ×1 IMPLANT
NS IRRIG 1000ML POUR BTL (IV SOLUTION) IMPLANT
PACK GENERAL/GYN (CUSTOM PROCEDURE TRAY) ×1 IMPLANT
PAD ABD 8X10 STRL (GAUZE/BANDAGES/DRESSINGS) IMPLANT
PIN SAFETY STERILE (MISCELLANEOUS) ×1 IMPLANT
SPONGE LAP 18X18 X RAY DECT (DISPOSABLE) IMPLANT
STAPLER VISISTAT 35W (STAPLE) ×1 IMPLANT
STOCKINETTE IMPERVIOUS 9X36 MD (GAUZE/BANDAGES/DRESSINGS) IMPLANT
SUT DVC VLOC 180 0 12IN GS21 (SUTURE) ×1
SUT ETHILON 2 0 FS 18 (SUTURE) ×2 IMPLANT
SUT MNCRL AB 4-0 PS2 18 (SUTURE) ×2 IMPLANT
SUT PDS AB 0 CT1 27 (SUTURE) IMPLANT
SUT PDS AB 2-0 CT2 27 (SUTURE) ×4 IMPLANT
SUT VLOC 180 0 6IN GS21 (SUTURE) IMPLANT
SUTURE DVC VLC 180 0 12IN GS21 (SUTURE) IMPLANT
SYR CONTROL 10ML LL (SYRINGE) IMPLANT
TAPE CLOTH SURG 6X10 WHT LF (GAUZE/BANDAGES/DRESSINGS) IMPLANT
TOWEL GREEN STERILE (TOWEL DISPOSABLE) ×1 IMPLANT
TUBING INFILTRATION IT-10001 (TUBING) IMPLANT
WATER STERILE IRR 1000ML POUR (IV SOLUTION) IMPLANT

## 2022-12-17 NOTE — Transfer of Care (Signed)
Immediate Anesthesia Transfer of Care Note  Patient: [name]  Procedure(s) Performed: LIPECTOMY BILATERAL ARM AND AXILLAE (Bilateral: Arm Upper)  Patient Location: PACU  Anesthesia Type:General  Level of Consciousness: awake, alert , and oriented  Airway & Oxygen Therapy: Patient Spontanous Breathing  Post-op Assessment: Report given to RN and Post -op Vital signs reviewed and stable  Post vital signs: Reviewed and stable  Last Vitals:  Vitals Value Taken Time  BP 184/74 12/17/22 0954  Temp    Pulse 82 12/17/22 0957  Resp    SpO2 100 % 12/17/22 0957  Vitals shown include unvalidated device data.  Last Pain:  Vitals:   12/17/22 0606  TempSrc:   PainSc: 0-No pain         Complications: No notable events documented.

## 2022-12-17 NOTE — Anesthesia Postprocedure Evaluation (Signed)
Anesthesia Post Note  Patient: [name]  Procedure(s) Performed: LIPECTOMY BILATERAL ARM AND AXILLAE (Bilateral: Arm Upper)     Patient location during evaluation: PACU Anesthesia Type: General Level of consciousness: awake Pain management: pain level controlled Vital Signs Assessment: post-procedure vital signs reviewed and stable Respiratory status: spontaneous breathing, nonlabored ventilation and respiratory function stable Cardiovascular status: blood pressure returned to baseline and stable Postop Assessment: no apparent nausea or vomiting Anesthetic complications: no   No notable events documented.  Last Vitals:  Vitals:   12/17/22 1030 12/17/22 1045  BP: (!) 165/78 (!) 196/80  Pulse: (!) 59 63  Resp: (!) 8 12  Temp:  36.7 C  SpO2: 100% 96%    Last Pain:  Vitals:   12/17/22 1045  TempSrc:   PainSc: 2                  Nilda Simmer

## 2022-12-17 NOTE — Interval H&P Note (Signed)
History and Physical Interval Note:  12/17/2022 7:01 AM  [name]  has presented today for surgery, with the diagnosis of intertrigo, hx bariatric surgery.  The various methods of treatment have been discussed with the patient and family. After consideration of risks, benefits and other options for treatment, the patient has consented to  Procedure(s): LIPECTOMY BILATERAL ARM AND AXILLAE (Bilateral) as a surgical intervention.  The patient's history has been reviewed, patient examined, no change in status, stable for surgery.  I have reviewed the patient's chart and labs.  Questions were answered to the patient's satisfaction.     Arnoldo Hooker Darya Bigler

## 2022-12-17 NOTE — Anesthesia Procedure Notes (Signed)
Procedure Name: Intubation Date/Time: 12/17/2022 7:19 AM  Performed by: Clearnce Sorrel, CRNAPre-anesthesia Checklist: Patient identified, Emergency Drugs available, Suction available and Patient being monitored Patient Re-evaluated:Patient Re-evaluated prior to induction Oxygen Delivery Method: Circle System Utilized Preoxygenation: Pre-oxygenation with 100% oxygen Induction Type: IV induction Ventilation: Mask ventilation without difficulty Laryngoscope Size: Mac and 3 Grade View: Grade I Tube type: Oral Tube size: 7.0 mm Number of attempts: 1 Airway Equipment and Method: Stylet and Oral airway Placement Confirmation: ETT inserted through vocal cords under direct vision, positive ETCO2 and breath sounds checked- equal and bilateral Secured at: 22 cm Tube secured with: Tape Dental Injury: Teeth and Oropharynx as per pre-operative assessment

## 2022-12-17 NOTE — Op Note (Signed)
Operative Note   DATE OF OPERATION: 2.19.24  LOCATION: [facility] Main OR-outpatient  SURGICAL DIVISION: Plastic Surgery  PREOPERATIVE DIAGNOSES:  Intertrigo chest, arms  POSTOPERATIVE DIAGNOSES:  same  PROCEDURE:  Lipectomy bilateral axillae, chest  SURGEON: Irene Limbo MD MBA  ASSISTANT: Gentry Fitz RNFA  ANESTHESIA:  General.   EBL: 25 ml  COMPLICATIONS: None immediate.   INDICATIONS FOR PROCEDURE:  The patient, [name], is a 47 y.o. female born on 11-12-1975, is here for treatment chronic intertrigo chest and axillae in setting redundant soft tissue that has failed conservative measures.   FINDINGS: Right axilla 399 g soft tissue resection, left axilla 387 soft tissue resection  DESCRIPTION OF PROCEDURE:  Patient's operative site was marked in the preoperative area. In upright sitting position, right and left axillary redundant tissue marked and elliptical shaped excision from lateral breast to proximal arm marked. Patient received IV antibiotics prior to beginning op procedure. Patient placed supine on operating table. Patient prepped and draped in sterile fashion.   Over left axilla, sharp excision of marked area completed and skin flap elevated in subcutaneous layer. Wound irrigated and hemostasis obtained. Local anesthetic infiltrated. 15 Fr JP placed in subcutaneous position and secured with 2-0 nylon. Closure completed with 2-0 and 0 PDS in superficial fascia. 0 V lock used to completed dermal closure and skin closure completed with 4-0 monocryl subcuticular. Over right arm, sharp excision of marked area completed and skin flap elevated in subcutaneous layer. Wound irrigated and hemostasis obtained. Local anesthetic infiltrated. 15 Fr JP placed in subcutaneous position and secured with 2-0 nylon. Closure completed with 2-0 and 0 PDS in superficial fascia. 0 V lock used to completed dermal closure and skin closure completed with 4-0 monocryl subcuticular. Dermabond applied  to incisions followed by dry dressing and medipore tape.    The patient was allowed to wake from anesthesia, extubated and taken to the recovery room in satisfactory condition.   SPECIMENS: none  DRAINS: 15 Fr JP in right and left subcutaneous chest/axillae  Irene Limbo, MD [facility] Plastic & Reconstructive Surgery  Office/ physician access line after hours [phone]

## 2022-12-18 ENCOUNTER — Encounter: Payer: Self-pay | Admitting: Internal Medicine

## 2023-01-21 ENCOUNTER — Ambulatory Visit: Payer: Medicaid Other | Attending: Obstetrics and Gynecology

## 2023-01-21 DIAGNOSIS — R279 Unspecified lack of coordination: Secondary | ICD-10-CM | POA: Diagnosis present

## 2023-01-21 DIAGNOSIS — M62838 Other muscle spasm: Secondary | ICD-10-CM | POA: Insufficient documentation

## 2023-01-21 DIAGNOSIS — R102 Pelvic and perineal pain: Secondary | ICD-10-CM | POA: Diagnosis present

## 2023-01-21 DIAGNOSIS — R293 Abnormal posture: Secondary | ICD-10-CM | POA: Diagnosis present

## 2023-01-21 DIAGNOSIS — M6281 Muscle weakness (generalized): Secondary | ICD-10-CM | POA: Insufficient documentation

## 2023-01-21 DIAGNOSIS — N393 Stress incontinence (female) (male): Secondary | ICD-10-CM | POA: Diagnosis present

## 2023-01-21 NOTE — Therapy (Signed)
OUTPATIENT PHYSICAL THERAPY TREATMENT NOTE   Patient Name: [name] MRN: [id number] DOB:Jan 05, 1976, 47 y.o., female Today's Date: 01/21/2023  PCP: Wardell Honour, MD REFERRING PROVIDER: Bobbye Charleston, MD   END OF SESSION:   PT End of Session - 01/21/23 1053     Visit Number 12    Date for PT Re-Evaluation 07/08/23    Authorization Type Medicaid  [employer]    Authorization Time Period 12/10/2022-02/07/2023    Authorization - Visit Number 2    Authorization - Number of Visits 5    PT Start Time 1100    PT Stop Time 1140    PT Time Calculation (min) 40 min    Activity Tolerance Patient tolerated treatment well    Behavior During Therapy [facility] for tasks assessed/performed                Past Medical History:  Diagnosis Date   Anemia    Carpal tunnel syndrome, bilateral    Chronic hypertension    Depression    GERD (gastroesophageal reflux disease)    Gestational diabetes    H. pylori infection    dx  09-02-2014-- treated w/ antibiotic   Hypertension    recently low pressures    Hypothyroid    Irregular menstrual cycle    Menorrhagia    PCOS (polycystic ovarian syndrome)    Type 2 diabetes mellitus ([place])    Past Surgical History:  Procedure Laterality Date   CARPAL TUNNEL RELEASE Bilateral 10/07/2014   Procedure: CARPAL TUNNEL RELEASE BILATERAL;  Surgeon: [name], MD;  Location: [place];  Service: Orthopedics;  Laterality: Bilateral;   CESAREAN SECTION  10/29/1993   CESAREAN SECTION WITH BILATERAL TUBAL LIGATION N/A 03/27/2019   Procedure: CESAREAN SECTION WITH BILATERAL TUBAL LIGATION;  Surgeon: Bobbye Charleston, MD;  Location: [place] [facility];  Service: Obstetrics;  Laterality: N/A;   DILITATION & CURRETTAGE/HYSTROSCOPY WITH NOVASURE ABLATION N/A 11/08/2021   Procedure: DILATATION & CURETTAGE/HYSTEROSCOPY WITH NOVASURE ABLATION;  Surgeon: Bobbye Charleston, MD;  Location: [place];  Service: Gynecology;   Laterality: N/A;   EYE SURGERY  09/13/2020   cataract bilateral   GASTRIC ROUX-EN-Y N/A 03/12/2017   Procedure: LAPAROSCOPIC ROUX-EN-Y GASTRIC BYPASS WITH UPPER ENDOSCOPY;  Surgeon: Kieth Brightly, Arta Bruce, MD;  Location: [facility];  Service: General;  Laterality: N/A;   HEMATOMA EVACUATION  12/09/2021   Procedure: EVACUATION HEMATOMA;  Surgeon: Irene Limbo, MD;  Location: [place];  Service: Plastics;;   I & D EXTREMITY N/A 12/09/2021   Procedure: IRRIGATION AND DEBRIDEMENT ABDOMEN;  Surgeon: Irene Limbo, MD;  Location: [place];  Service: Plastics;  Laterality: N/A;   PANNICULECTOMY N/A 12/08/2021   Procedure: PANNICULECTOMY;  Surgeon: Irene Limbo, MD;  Location: [place];  Service: Plastics;  Laterality: N/A;   TUBAL LIGATION  03/27/2019   UPPER GI ENDOSCOPY  03/12/2017   Procedure: UPPER GI ENDOSCOPY;  Surgeon: Kieth Brightly Arta Bruce, MD;  Location: [facility];  Service: General;;   Patient Active Problem List   Diagnosis Date Noted   S/P panniculectomy 12/09/2021   Iron deficiency anemia 06/26/2021   Vaginal bleeding in pregnancy, third trimester 03/11/2019   GBS bacteriuria 01/22/2019   Pregnancy of unknown anatomic location 08/10/2018   Intertrigo 11/22/2017   S/P gastric bypass 04/04/2017   Morbid obesity ([place]) 03/12/2017   Hypothyroidism 09/11/2016   Allergic rhinitis due to pollen 06/29/2015   Anxiety and depression 06/29/2015   Gastroesophageal reflux disease without esophagitis 06/29/2015  OSA on CPAP 06/29/2015   History of syphilis 05/23/2015   Irregular menstrual cycle 11/12/2013   POLYCYSTIC OVARIAN DISEASE 06/20/2009    REFERRING DIAG: N39.3 (ICD-10-CM) - Stress incontinence (female) (female)  THERAPY DIAG:  Unspecified lack of coordination - Plan: PT plan of care cert/re-cert  Abnormal posture - Plan: PT plan of care cert/re-cert  Muscle weakness (generalized) - Plan: PT plan of care cert/re-cert  Other muscle spasm - Plan: PT plan of  care cert/re-cert  Pelvic pain - Plan: PT plan of care cert/re-cert  Stress incontinence (female) (female) - Plan: PT plan of care cert/re-cert  Rationale for Evaluation and Treatment Rehabilitation  PERTINENT HISTORY: diabetes, Poorly Controlled, Type 2 Hypothyroidism,   Morbid obesity, Anxiety Depression, GERD,  hypertension, gastric bypass 2018, abdominal and bil adductor skin removal   PRECAUTIONS: NA  SUBJECTIVE:                                                                                                                                                                                      SUBJECTIVE STATEMENT:  Pt states that surgery on bil UE for skin removal went well, but incisions still have not closed and she is not supposed to lift arms above level of shoulder. She is having a lot of bil LE cramps. She is not drinking much water. She has had some painful cramping in Lt lower quadrant that was severe - only happened 5-6x total - different from pelvic pressure she had been feeling before. She had some urinary leaking with strong cough, but in general she feels like bladder is doing much better. She has not had any tailbone pain or low back pain, but she can feel her tailbone clicking sometimes. Pt states that she would like to continue PT is goals that she can continue improving tightness on bil inner thighs.    PAIN:  Are you having pain? Yes: NPRS scale: 1.5/10 Pain location: low back/abdomen Pain description: pressure Aggravating factors: random Relieving factors: nothing  SUBJECTIVE STATEMENT: "Every time I cough or sneeze I pee on myself". Small leakage reported. Also with laughing, exercising. Also urgency with leakage too. Wears pads but causes rash and sometimes leaves toilet paper at underwear. Pt reports she has had 5 day history of vaginal discharge with clear/blood tinged mucus noted and hasn't told gyn yet.    Fluid intake: Yes: water - 2 small bottles. Coffee in  morning and sometimes poweraide. Rarely soda.      PAIN:  Are you having pain? Yes NPRS scale: 8/10 Pain location:  anterior low abdomen and low back at the same time   Pain type: cramping, tight  Pain  description: intermittent    Aggravating factors: unsure seems random. Before would have pain prior to periods and sometimes afterward but now no longer having periods.  Relieving factors: resting   PRECAUTIONS: None   WEIGHT BEARING RESTRICTIONS: No   FALLS:  Has patient fallen in last 6 months? 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   PLOF: Independent   PATIENT GOALS: to have less leakage   PERTINENT HISTORY:  diabetes, Poorly Controlled, Type 2 Hypothyroidism,   Morbid obesity, Anxiety Depression, GERD,  hypertension, gastric bypass 2018, abdominal and bil adductor skin removal    Sexual abuse: No   BOWEL MOVEMENT: Pain with bowel movement: No Type of bowel movement:Type (Bristol Stool Scale) 4, Frequency daily sometimes 2-3x (largest in morning, others smaller), and Strain No Fully empty rectum: Yes:   Leakage: No Pads: No Fiber supplement: No   URINATION: Pain with urination: No Fully empty bladder: Yes: but sometimes feels like she will go again 30 mins later Stream: Strong Urgency: Yes:   Frequency: with coffee quicker than 2 hours but not usually, not regularly up at night  Leakage: Urge to void, Coughing, Sneezing, Laughing, Exercise, and Lifting Pads: Yes: sometimes   INTERCOURSE: Pain with intercourse:  not active  no painful    PREGNANCY: Vaginal deliveries 1 Tearing No C-section deliveries 2 Currently pregnant No   PROLAPSE: None     OBJECTIVE:   01/21/23: A/PROM A/PROM  eval  Flexion WNL/100%  Extension 75%, pain anteriorly  Right lateral flexion 50%  Left lateral flexion 50%  Right rotation 50%  Left rotation 50%   (Blank  rows = not tested) PALPATION: significant scar tissue restriction still present in lower abdomen and bil inner thighs, Rt>Lt  PFIQ-7: 57  11/27/22: PELVIC MMT:   MMT eval  Vaginal 4/5, 10 second endurance, 10 repeat contractions  Internal Anal Sphincter    External Anal Sphincter    Puborectalis    Diastasis Recti   (Blank rows = not tested)         TONE: Posterior levator ani restriction/pain   PROLAPSE: Grade 1 anterior vaginal wall laxity  DIAGNOSTIC FINDINGS:    PFIQ-7 38   COGNITION: Overall cognitive status: Within functional limits for tasks assessed                          SENSATION: Light touch: Appears intact Proprioception: Appears intact   MUSCLE LENGTH: Bil hamstrings and adductors limited by 50%     POSTURE: rounded shoulders, forward head, and posterior pelvic tilt   PELVIC ALIGNMENT:   LUMBARAROM/PROM:   A/PROM A/PROM  eval  Flexion Decreased by 50% with abdominal pain reported  Extension Decreased by 25%   Right lateral flexion Decreased by 50% with abdominal pain reported  Left lateral flexion Decreased by 50% with abdominal pain reported  Right rotation Decreased by 50% with abdominal pain reported  Left rotation Decreased by 50% with abdominal pain reported   (Blank rows = not tested)   LOWER EXTREMITY ROM:   WFL   LOWER EXTREMITY MMT:   Bil hips 3+/5, knees and ankles 4+/5   PALPATION:   General  TTP throughout midline of abdomen, profoundly tight tissue restrictions throughout midline and low abdominal quadrants and pt has swelling midline with scarring sites post skin removal surgery with transabdominal and bil adductors. Skin changes at  scar sites with darkening and very restricted throughout sites in all directions. Midline lower abdominal quadrant most restricted and swollen with stiffness limiting all mobility over bladder. Bil adductors proximally restricted in all directions due to scar restrictions as well and bilaterally pt  has areas of scar sites that are red with white dry skin noted and she reports due to friction.                   External Perineal Exam dryness noted externally. Pt reports she has a lot of skin allergies and doesn't know what pads to use and has been using toilet paper in panties for leakage. Pt's vulva and clitoral hood moving well without restrictions does have some labia minor adhesion noted however no redness and tissue WFL.                              Internal Pelvic Floor pt denied all TTP   Patient confirms identification and approves PT to assess internal pelvic floor and treatment Yes      TODAY'S TREATMENT 12/11/22 RE-EVAL Manual: Scar tissue mobilization to bil adductors/groin Exercises: Bent knee fall outs 10x bil Unilateral butterfly 2 min bil   TREATMENT 12/11/22 Manual: Myofascial release bil adductors Scar tissue mobilization bil inner thighs Exercises: Side lying adduction 2 x 10 bil Happy baby stretch 10 breaths Standing adductor stretch    TREATMENT 12/06/22 Manual: Pt provides verbal consent for internal vaginal/rectal pelvic floor exam. Intra-rectal exam and treatment Rectal pelvic floor muscle release Coccyx mobilizations/traction   PATIENT EDUCATION:  Education details: scar mobility  Person educated: Patient Education method: Explanation, Demonstration, Tactile cues, Verbal cues, and Handouts Education comprehension: verbalized understanding and returned demonstration   HOME EXERCISE PROGRAM: [email or web address]    ASSESSMENT:   CLINICAL IMPRESSION: Pt returns to physical therapy in first time in 5 weeks after having bil UE skin removal surgery. She has seen improved lower abdominal/pelvic pain and incontinence, but still have tailbone pain with prolonged sitting. The tightness in bil inner thighs is still bothering her is likely contributing to tailbone pain. Believe that pelvic floor tension is likely still contributing to overall condition, but not  formally evaluated this treatment session. Her lumbar A/ROM is improving, allowing for and demonstrating improved abdominal scar tissue mobility, possibly responsible for decrease in lower abdominal pressure. The cramping she is experiencing is likely due to decrease in activity overall since surgery and not drinking water as part of daily routine.She will continue to benefit from skilled PT intervention in order to increase functional pelvic floor strength and improve coccyx/low back/abdominal pain, returning her to functional activities with less difficulty (updated goals).    OBJECTIVE IMPAIRMENTS: decreased coordination, decreased endurance, decreased mobility, decreased strength, increased fascial restrictions, increased muscle spasms, impaired flexibility, improper body mechanics, postural dysfunction, and pain.    ACTIVITY LIMITATIONS: carrying, lifting, squatting, continence, and locomotion level   PARTICIPATION LIMITATIONS: community activity   PERSONAL FACTORS: Fitness, Time since onset of injury/illness/exacerbation, and 1 comorbidity: medical history   are also affecting patient's functional outcome.    REHAB POTENTIAL: Good   CLINICAL DECISION MAKING: Stable/uncomplicated   EVALUATION COMPLEXITY: Low     GOALS: Goals reviewed with patient? Yes   SHORT TERM GOALS: Target date: 10/07/22 - updated 10/24/22 updated 11/27/22 - updated 01/21/23 new goal 07/08/23   Pt to be I with HEP.  Baseline: Goal status:  MET 11/27/22   2.  Pt  to be I with scar mobility and diaphragmatic breathing for improved abdominal mobility and decreased tissue restrictions.  Baseline:  Goal status:  MET 11/27/22   3.  Pt to demonstrate improved coordination with pelvic floor and breathing mechanics with body weight squats for pelvic stability and decreased leakage.  Baseline:  Goal status:  MET 11/27/22     LONG TERM GOALS: Target date: 01/09/23 - updated 01/21/23 new goal 07/08/23   Pt to be I with  advanced HEP.  Baseline:  Goal status: IN PROGRESS   2.  Pt to demonstrate improved coordination with pelvic floor and breathing mechanics with 20#  squats for pelvic stability and decreased leakage.  Baseline:  Goal status:  IN PROGRESS   3.  Pt to report no more than 1 instance of urinary leakage per month for improved QOL and skin integrity.  Baseline:  Goal status:  MET 01/21/23   4.  Pt will have 50% less urgency due to bladder retraining and strengthening  Baseline:  Goal status:  MET 11/27/22   5.  Pt to report improved time between bladder voids to at least 2.5 hours with increase in fluid intake for improved QOL with decreased urinary frequency.   Baseline:  Goal status:  IN PROGRESS   6.  Pt to demonstrate improved thoracic and lumbar mobility to [facility] of ROM for improved mobility at spine and allow more mobility at abdomen.  Baseline:  Goal status:  IN PROGRESS  7. Pt will be able to open bil LE without pain or difficulty due to scar tissue restriction.  Baseline:  Goal status: INITIAL  8. Pt will be able to walk and breathe without difficulty in order to get appropriate level of exercise.   Baseline:  Goal status: INITIAL  9. Pt will be able to sit for >30 minutes without having to shift in seat due to tailbone pain in order to travel or go to a movie.   Baseline:  Goal status: INITIAL   PLAN:   PT FREQUENCY: 2x/week   PT DURATION:  16 sessions   PLANNED INTERVENTIONS: Therapeutic exercises, Therapeutic activity, Neuromuscular re-education, Patient/Family education, Self Care, Joint mobilization, Aquatic Therapy, Dry Needling, Electrical stimulation, Spinal mobilization, Cryotherapy, Moist heat, scar mobilization, Taping, Traction, Biofeedback, and Manual therapy   PLAN FOR NEXT SESSION: continue manual techniques; progress strengthening and mobility.    Heather Roberts, PT, DPT03/25/2411:45 AM

## 2023-01-23 ENCOUNTER — Ambulatory Visit (AMBULATORY_SURGERY_CENTER): Payer: Medicaid Other | Admitting: *Deleted

## 2023-01-23 VITALS — Ht 62.0 in | Wt 202.0 lb

## 2023-01-23 DIAGNOSIS — Z1211 Encounter for screening for malignant neoplasm of colon: Secondary | ICD-10-CM

## 2023-01-23 MED ORDER — NA SULFATE-K SULFATE-MG SULF 17.5-3.13-1.6 GM/177ML PO SOLN: 1.0000 | Freq: Once | ORAL | 0 refills | Status: AC

## 2023-01-23 NOTE — Progress Notes (Signed)
No egg or soy allergy known to patient  No issues known to pt with past sedation with any surgeries or procedures Patient denies ever being told they had issues or difficulty with intubation  No FH of Malignant Hyperthermia Pt is not on diet pills Pt is not on  home 02  Pt is not on blood thinners  Pt denies issues with constipation  No A fib or A flutter Have any cardiac testing pending--no Pt instructed to use [email or web address] or GoodRx for a price reduction on prep  Patient's chart reviewed by [name] CNRA prior to previsit and patient appropriate for the [facility].  Previsit completed and red dot placed by patient's name on their procedure day (on provider's schedule).    

## 2023-01-24 ENCOUNTER — Ambulatory Visit: Payer: Medicaid Other

## 2023-01-30 ENCOUNTER — Ambulatory Visit: Payer: Medicaid Other

## 2023-02-04 ENCOUNTER — Ambulatory Visit: Payer: Medicaid Other

## 2023-02-08 ENCOUNTER — Encounter: Payer: Self-pay | Admitting: Hematology and Oncology

## 2023-02-11 ENCOUNTER — Ambulatory Visit: Payer: Medicaid Other | Attending: Obstetrics and Gynecology

## 2023-02-11 DIAGNOSIS — R102 Pelvic and perineal pain: Secondary | ICD-10-CM | POA: Diagnosis present

## 2023-02-11 DIAGNOSIS — M6281 Muscle weakness (generalized): Secondary | ICD-10-CM | POA: Diagnosis present

## 2023-02-11 DIAGNOSIS — R293 Abnormal posture: Secondary | ICD-10-CM | POA: Insufficient documentation

## 2023-02-11 DIAGNOSIS — R279 Unspecified lack of coordination: Secondary | ICD-10-CM | POA: Insufficient documentation

## 2023-02-11 DIAGNOSIS — N393 Stress incontinence (female) (male): Secondary | ICD-10-CM | POA: Diagnosis present

## 2023-02-11 DIAGNOSIS — M62838 Other muscle spasm: Secondary | ICD-10-CM | POA: Insufficient documentation

## 2023-02-11 NOTE — Therapy (Signed)
OUTPATIENT PHYSICAL THERAPY TREATMENT NOTE   Patient Name: [name] MRN: [id number] DOB:1975/11/22, 47 y.o., female Today's Date: 02/11/2023  PCP: Ethelda Chick, MD REFERRING PROVIDER: Carrington Clamp, MD   END OF SESSION:   PT End of Session - 02/11/23 1148     Visit Number 13    Date for PT Re-Evaluation 07/08/23    Authorization Type Medicaid  [employer]    Authorization Time Period 02/11/2023-7/13/202    Authorization - Visit Number 1    Authorization - Number of Visits 11    PT Start Time 1145    PT Stop Time 1225    PT Time Calculation (min) 40 min    Activity Tolerance Patient tolerated treatment well    Behavior During Therapy WFL for tasks assessed/performed                Past Medical History:  Diagnosis Date   Anemia    Anxiety    Blood transfusion without reported diagnosis    Carpal tunnel syndrome, bilateral    Cataract    removed bilateral   Chronic hypertension    Depression    GERD (gastroesophageal reflux disease)    Gestational diabetes    H. pylori infection    dx  09-02-2014-- treated w/ antibiotic   Hypertension    recently low pressures    Hypothyroid    Irregular menstrual cycle    Menorrhagia    PCOS (polycystic ovarian syndrome)    Sleep apnea    Type 2 diabetes mellitus    Past Surgical History:  Procedure Laterality Date   CARPAL TUNNEL RELEASE Bilateral 10/07/2014   Procedure: CARPAL TUNNEL RELEASE BILATERAL;  Surgeon: Sharma Covert, MD;  Location: [facility] [place];  Service: Orthopedics;  Laterality: Bilateral;   CESAREAN SECTION  10/29/1993   CESAREAN SECTION WITH BILATERAL TUBAL LIGATION N/A 03/27/2019   Procedure: CESAREAN SECTION WITH BILATERAL TUBAL LIGATION;  Surgeon: Carrington Clamp, MD;  Location: [facility];  Service: Obstetrics;  Laterality: N/A;   DILITATION & CURRETTAGE/HYSTROSCOPY WITH NOVASURE ABLATION N/A 11/08/2021   Procedure: DILATATION & CURETTAGE/HYSTEROSCOPY WITH NOVASURE  ABLATION;  Surgeon: Carrington Clamp, MD;  Location: [facility];  Service: Gynecology;  Laterality: N/A;   excessive skin removal     legs,stomach,arms   EYE SURGERY  09/13/2020   cataract bilateral   GASTRIC ROUX-EN-Y N/A 03/12/2017   Procedure: LAPAROSCOPIC ROUX-EN-Y GASTRIC BYPASS WITH UPPER ENDOSCOPY;  Surgeon: Sheliah Hatch De Blanch, MD;  Location: [facility];  Service: General;  Laterality: N/A;   HEMATOMA EVACUATION  12/09/2021   Procedure: EVACUATION HEMATOMA;  Surgeon: Glenna Fellows, MD;  Location: [facility];  Service: Plastics;;   I & D EXTREMITY N/A 12/09/2021   Procedure: IRRIGATION AND DEBRIDEMENT ABDOMEN;  Surgeon: Glenna Fellows, MD;  Location: [facility];  Service: Plastics;  Laterality: N/A;   PANNICULECTOMY N/A 12/08/2021   Procedure: PANNICULECTOMY;  Surgeon: Glenna Fellows, MD;  Location: [facility];  Service: Plastics;  Laterality: N/A;   TUBAL LIGATION  03/27/2019   UPPER GI ENDOSCOPY  03/12/2017   Procedure: UPPER GI ENDOSCOPY;  Surgeon: Sheliah Hatch De Blanch, MD;  Location: [facility];  Service: General;;   Patient Active Problem List   Diagnosis Date Noted   S/P panniculectomy 12/09/2021   Iron deficiency anemia 06/26/2021   Vaginal bleeding in pregnancy, third trimester 03/11/2019   GBS bacteriuria 01/22/2019   Pregnancy of unknown anatomic location 08/10/2018   Intertrigo 11/22/2017   S/P gastric  bypass 04/04/2017   Morbid obesity 03/12/2017   Hypothyroidism 09/11/2016   Allergic rhinitis due to pollen 06/29/2015   Anxiety and depression 06/29/2015   Gastroesophageal reflux disease without esophagitis 06/29/2015   OSA on CPAP 06/29/2015   History of syphilis 05/23/2015   Irregular menstrual cycle 11/12/2013   POLYCYSTIC OVARIAN DISEASE 06/20/2009    REFERRING DIAG: N39.3 (ICD-10-CM) - Stress incontinence (female) (female)  THERAPY DIAG:  Unspecified lack of coordination  Abnormal posture  Muscle weakness  (generalized)  Other muscle spasm  Pelvic pain  Stress incontinence (female) (female)  Rationale for Evaluation and Treatment Rehabilitation  PERTINENT HISTORY: diabetes, Poorly Controlled, Type 2 Hypothyroidism,   Morbid obesity, Anxiety Depression, GERD,  hypertension, gastric bypass 2018, abdominal and bil adductor skin removal   PRECAUTIONS: NA  SUBJECTIVE:                                                                                                                                                                                      SUBJECTIVE STATEMENT:  Pt states that she has been under significant stress.    PAIN:  Are you having pain? Yes: NPRS scale: 1.5/10 Pain location: low back/abdomen Pain description: pressure Aggravating factors: random Relieving factors: nothing  SUBJECTIVE STATEMENT: "Every time I cough or sneeze I pee on myself". Small leakage reported. Also with laughing, exercising. Also urgency with leakage too. Wears pads but causes rash and sometimes leaves toilet paper at underwear. Pt reports she has had 5 day history of vaginal discharge with clear/blood tinged mucus noted and hasn't told gyn yet.    Fluid intake: Yes: water - 2 small bottles. Coffee in morning and sometimes poweraide. Rarely soda.      PAIN:  Are you having pain? Yes NPRS scale: 8/10 Pain location:  anterior low abdomen and low back at the same time   Pain type: cramping, tight  Pain description: intermittent    Aggravating factors: unsure seems random. Before would have pain prior to periods and sometimes afterward but now no longer having periods.  Relieving factors: resting   PRECAUTIONS: None   WEIGHT BEARING RESTRICTIONS: No   FALLS:  Has patient fallen in last 6 months? 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   PLOF: Independent   PATIENT GOALS: to have  less leakage   PERTINENT HISTORY:  diabetes, Poorly Controlled, Type 2 Hypothyroidism,   Morbid obesity, Anxiety Depression, GERD,  hypertension, gastric bypass 2018, abdominal and bil adductor skin removal    Sexual abuse: No   BOWEL MOVEMENT: Pain with bowel  movement: No Type of bowel movement:Type (Bristol Stool Scale) 4, Frequency daily sometimes 2-3x (largest in morning, others smaller), and Strain No Fully empty rectum: Yes:   Leakage: No Pads: No Fiber supplement: No   URINATION: Pain with urination: No Fully empty bladder: Yes: but sometimes feels like she will go again 30 mins later Stream: Strong Urgency: Yes:   Frequency: with coffee quicker than 2 hours but not usually, not regularly up at night  Leakage: Urge to void, Coughing, Sneezing, Laughing, Exercise, and Lifting Pads: Yes: sometimes   INTERCOURSE: Pain with intercourse:  not active  no painful    PREGNANCY: Vaginal deliveries 1 Tearing No C-section deliveries 2 Currently pregnant No   PROLAPSE: None     OBJECTIVE:   01/21/23: A/PROM A/PROM  eval  Flexion WNL/100%  Extension 75%, pain anteriorly  Right lateral flexion 50%  Left lateral flexion 50%  Right rotation 50%  Left rotation 50%   (Blank rows = not tested) PALPATION: significant scar tissue restriction still present in lower abdomen and bil inner thighs, Rt>Lt  PFIQ-7: 57  11/27/22: PELVIC MMT:   MMT eval  Vaginal 4/5, 10 second endurance, 10 repeat contractions  Internal Anal Sphincter    External Anal Sphincter    Puborectalis    Diastasis Recti   (Blank rows = not tested)         TONE: Posterior levator ani restriction/pain   PROLAPSE: Grade 1 anterior vaginal wall laxity  DIAGNOSTIC FINDINGS:    PFIQ-7 38   COGNITION: Overall cognitive status: Within functional limits for tasks assessed                          SENSATION: Light touch: Appears intact Proprioception: Appears intact   MUSCLE LENGTH: Bil  hamstrings and adductors limited by 50%     POSTURE: rounded shoulders, forward head, and posterior pelvic tilt   PELVIC ALIGNMENT:   LUMBARAROM/PROM:   A/PROM A/PROM  eval  Flexion Decreased by 50% with abdominal pain reported  Extension Decreased by 25%   Right lateral flexion Decreased by 50% with abdominal pain reported  Left lateral flexion Decreased by 50% with abdominal pain reported  Right rotation Decreased by 50% with abdominal pain reported  Left rotation Decreased by 50% with abdominal pain reported   (Blank rows = not tested)   LOWER EXTREMITY ROM:   WFL   LOWER EXTREMITY MMT:   Bil hips 3+/5, knees and ankles 4+/5   PALPATION:   General  TTP throughout midline of abdomen, profoundly tight tissue restrictions throughout midline and low abdominal quadrants and pt has swelling midline with scarring sites post skin removal surgery with transabdominal and bil adductors. Skin changes at scar sites with darkening and very restricted throughout sites in all directions. Midline lower abdominal quadrant most restricted and swollen with stiffness limiting all mobility over bladder. Bil adductors proximally restricted in all directions due to scar restrictions as well and bilaterally pt has areas of scar sites that are red with white dry skin noted and she reports due to friction.                   External Perineal Exam dryness noted externally. Pt reports she has a lot of skin allergies and doesn't know what pads to use and has been using toilet paper in panties for leakage. Pt's vulva and clitoral hood moving well without restrictions does have some labia minor adhesion noted however no  redness and tissue WFL.                              Internal Pelvic Floor pt denied all TTP   Patient confirms identification and approves PT to assess internal pelvic floor and treatment Yes      TODAY'S TREATMENT 02/11/23 Manual: Trigger Point Dry-Needling  Treatment instructions:  Expect mild to moderate muscle soreness. S/S of pneumothorax if dry needled over a lung field, and to seek immediate medical attention should they occur. Patient verbalized understanding of these instructions and education.  Patient Consent Given: Yes Education handout provided: Previously provided Muscles treated: Bil UT Electrical stimulation performed: No Parameters: N/A Treatment response/outcome: twitch response/release Soft tissue mobilization to Bil UT Therapeutic activities: HEP review Education on relationship between breathing and pelvic floor   TREATMENT 01/21/23 RE-EVAL Manual: Scar tissue mobilization to bil adductors/groin Exercises: Bent knee fall outs 10x bil Unilateral butterfly 2 min bil   TREATMENT 12/11/22 Manual: Myofascial release bil adductors Scar tissue mobilization bil inner thighs Exercises: Side lying adduction 2 x 10 bil Happy baby stretch 10 breaths Standing adductor stretch   PATIENT EDUCATION:  Education details: scar mobility  Person educated: Patient Education method: Explanation, Demonstration, Tactile cues, Verbal cues, and Handouts Education comprehension: verbalized understanding and returned demonstration   HOME EXERCISE PROGRAM: [id number]    ASSESSMENT:   CLINICAL IMPRESSION: Due to increase in coccydynia over the last couple of weeks, manual techniques performed today. However, after recent surgery on bil UE and considerable stress, believe that addressing upper body restriction that is limiting full thoracic expansion was more beneficial to improving pelvic pain today vs working more locally. We discussed this relationship and patient reports good understanding. She has been limited in HEP due to being very busy/daughter having had surgery, but we reviewed exercise that would be beneficial. She will continue to benefit from skilled PT intervention in order to increase functional pelvic floor strength and improve coccyx/low  back/abdominal pain, returning her to functional activities with less difficulty (updated goals).    OBJECTIVE IMPAIRMENTS: decreased coordination, decreased endurance, decreased mobility, decreased strength, increased fascial restrictions, increased muscle spasms, impaired flexibility, improper body mechanics, postural dysfunction, and pain.    ACTIVITY LIMITATIONS: carrying, lifting, squatting, continence, and locomotion level   PARTICIPATION LIMITATIONS: community activity   PERSONAL FACTORS: Fitness, Time since onset of injury/illness/exacerbation, and 1 comorbidity: medical history   are also affecting patient's functional outcome.    REHAB POTENTIAL: Good   CLINICAL DECISION MAKING: Stable/uncomplicated   EVALUATION COMPLEXITY: Low     GOALS: Goals reviewed with patient? Yes   SHORT TERM GOALS: Target date: 10/07/22 - updated 10/24/22 updated 11/27/22 - updated 01/21/23 new goal 07/08/23 - updated 02/11/23   Pt to be I with HEP.  Baseline: Goal status:  MET 11/27/22   2.  Pt to be I with scar mobility and diaphragmatic breathing for improved abdominal mobility and decreased tissue restrictions.  Baseline:  Goal status:  MET 11/27/22   3.  Pt to demonstrate improved coordination with pelvic floor and breathing mechanics with body weight squats for pelvic stability and decreased leakage.  Baseline:  Goal status:  MET 11/27/22     LONG TERM GOALS: Target date: 01/09/23 - updated 01/21/23 new goal 07/08/23 - updated 02/11/23   Pt to be I with advanced HEP.  Baseline:  Goal status: IN PROGRESS   2.  Pt to demonstrate improved coordination with  pelvic floor and breathing mechanics with 20#  squats for pelvic stability and decreased leakage.  Baseline:  Goal status:  IN PROGRESS   3.  Pt to report no more than 1 instance of urinary leakage per month for improved QOL and skin integrity.  Baseline:  Goal status:  MET 01/21/23   4.  Pt will have 50% less urgency due to bladder  retraining and strengthening  Baseline:  Goal status:  MET 11/27/22   5.  Pt to report improved time between bladder voids to at least 2.5 hours with increase in fluid intake for improved QOL with decreased urinary frequency.   Baseline:  Goal status:  IN PROGRESS   6.  Pt to demonstrate improved thoracic and lumbar mobility to [facility] of ROM for improved mobility at spine and allow more mobility at abdomen.  Baseline:  Goal status:  IN PROGRESS  7. Pt will be able to open bil LE without pain or difficulty due to scar tissue restriction.  Baseline:  Goal status: IN PROGRESS  8. Pt will be able to walk and breathe without difficulty in order to get appropriate level of exercise.   Baseline:  Goal status: IN PROGRESS  9. Pt will be able to sit for >30 minutes without having to shift in seat due to tailbone pain in order to travel or go to a movie.   Baseline:  Goal status: IN PROGRESS   PLAN:   PT FREQUENCY: 2x/week   PT DURATION:  16 sessions   PLANNED INTERVENTIONS: Therapeutic exercises, Therapeutic activity, Neuromuscular re-education, Patient/Family education, Self Care, Joint mobilization, Aquatic Therapy, Dry Needling, Electrical stimulation, Spinal mobilization, Cryotherapy, Moist heat, scar mobilization, Taping, Traction, Biofeedback, and Manual therapy   PLAN FOR NEXT SESSION: Continue manual techniques to improve thoracic expansion during breathing; progress core strengthening.    Julio Alm, PT, DPT04/15/2412:29 PM

## 2023-02-14 ENCOUNTER — Ambulatory Visit: Payer: Medicaid Other

## 2023-02-18 ENCOUNTER — Ambulatory Visit: Payer: Medicaid Other

## 2023-02-19 ENCOUNTER — Encounter: Payer: Medicaid Other | Admitting: Internal Medicine

## 2023-02-21 ENCOUNTER — Ambulatory Visit: Payer: Medicaid Other

## 2023-02-21 DIAGNOSIS — R279 Unspecified lack of coordination: Secondary | ICD-10-CM | POA: Diagnosis not present

## 2023-02-21 DIAGNOSIS — R102 Pelvic and perineal pain: Secondary | ICD-10-CM

## 2023-02-21 DIAGNOSIS — M62838 Other muscle spasm: Secondary | ICD-10-CM

## 2023-02-21 DIAGNOSIS — R293 Abnormal posture: Secondary | ICD-10-CM

## 2023-02-21 DIAGNOSIS — M6281 Muscle weakness (generalized): Secondary | ICD-10-CM

## 2023-02-21 DIAGNOSIS — N393 Stress incontinence (female) (male): Secondary | ICD-10-CM

## 2023-02-21 NOTE — Therapy (Signed)
OUTPATIENT PHYSICAL THERAPY TREATMENT NOTE   Patient Name: [name] MRN: [id number] DOB:1976-09-14, 47 y.o., female Today's Date: 02/21/2023  PCP: Ethelda Chick, MD REFERRING PROVIDER: Carrington Clamp, MD   END OF SESSION:   PT End of Session - 02/21/23 1145     Visit Number 14    Date for PT Re-Evaluation 07/08/23    Authorization Type Medicaid  [employer]    Authorization Time Period 02/11/2023-7/13/202    Authorization - Visit Number 2    Authorization - Number of Visits 11    PT Start Time 1145    PT Stop Time 1225    PT Time Calculation (min) 40 min    Activity Tolerance Patient tolerated treatment well    Behavior During Therapy WFL for tasks assessed/performed                 Past Medical History:  Diagnosis Date   Anemia    Anxiety    Blood transfusion without reported diagnosis    Carpal tunnel syndrome, bilateral    Cataract    removed bilateral   Chronic hypertension    Depression    GERD (gastroesophageal reflux disease)    Gestational diabetes    H. pylori infection    dx  09-02-2014-- treated w/ antibiotic   Hypertension    recently low pressures    Hypothyroid    Irregular menstrual cycle    Menorrhagia    PCOS (polycystic ovarian syndrome)    Sleep apnea    Type 2 diabetes mellitus    Past Surgical History:  Procedure Laterality Date   CARPAL TUNNEL RELEASE Bilateral 10/07/2014   Procedure: CARPAL TUNNEL RELEASE BILATERAL;  Surgeon: Sharma Covert, MD;  Location: [facility];  Service: Orthopedics;  Laterality: Bilateral;   CESAREAN SECTION  10/29/1993   CESAREAN SECTION WITH BILATERAL TUBAL LIGATION N/A 03/27/2019   Procedure: CESAREAN SECTION WITH BILATERAL TUBAL LIGATION;  Surgeon: Carrington Clamp, MD;  Location: [facility];  Service: Obstetrics;  Laterality: N/A;   DILITATION & CURRETTAGE/HYSTROSCOPY WITH NOVASURE ABLATION N/A 11/08/2021   Procedure: DILATATION & CURETTAGE/HYSTEROSCOPY WITH  NOVASURE ABLATION;  Surgeon: Carrington Clamp, MD;  Location: [facility];  Service: Gynecology;  Laterality: N/A;   excessive skin removal     legs,stomach,arms   EYE SURGERY  09/13/2020   cataract bilateral   GASTRIC ROUX-EN-Y N/A 03/12/2017   Procedure: LAPAROSCOPIC ROUX-EN-Y GASTRIC BYPASS WITH UPPER ENDOSCOPY;  Surgeon: Sheliah Hatch De Blanch, MD;  Location: [facility];  Service: General;  Laterality: N/A;   HEMATOMA EVACUATION  12/09/2021   Procedure: EVACUATION HEMATOMA;  Surgeon: Glenna Fellows, MD;  Location: [facility];  Service: Plastics;;   I & D EXTREMITY N/A 12/09/2021   Procedure: IRRIGATION AND DEBRIDEMENT ABDOMEN;  Surgeon: Glenna Fellows, MD;  Location: [facility];  Service: Plastics;  Laterality: N/A;   PANNICULECTOMY N/A 12/08/2021   Procedure: PANNICULECTOMY;  Surgeon: Glenna Fellows, MD;  Location: [facility];  Service: Plastics;  Laterality: N/A;   TUBAL LIGATION  03/27/2019   UPPER GI ENDOSCOPY  03/12/2017   Procedure: UPPER GI ENDOSCOPY;  Surgeon: Sheliah Hatch De Blanch, MD;  Location: [facility];  Service: General;;   Patient Active Problem List   Diagnosis Date Noted   S/P panniculectomy 12/09/2021   Iron deficiency anemia 06/26/2021   Vaginal bleeding in pregnancy, third trimester 03/11/2019   GBS bacteriuria 01/22/2019   Pregnancy of unknown anatomic location 08/10/2018   Intertrigo 11/22/2017   S/P  gastric bypass 04/04/2017   Morbid obesity 03/12/2017   Hypothyroidism 09/11/2016   Allergic rhinitis due to pollen 06/29/2015   Anxiety and depression 06/29/2015   Gastroesophageal reflux disease without esophagitis 06/29/2015   OSA on CPAP 06/29/2015   History of syphilis 05/23/2015   Irregular menstrual cycle 11/12/2013   POLYCYSTIC OVARIAN DISEASE 06/20/2009    REFERRING DIAG: N39.3 (ICD-10-CM) - Stress incontinence (female) (female)  THERAPY DIAG:  Unspecified lack of coordination  Abnormal posture  Muscle weakness  (generalized)  Other muscle spasm  Pelvic pain  Stress incontinence (female) (female)  Rationale for Evaluation and Treatment Rehabilitation  PERTINENT HISTORY: diabetes, Poorly Controlled, Type 2 Hypothyroidism,   Morbid obesity, Anxiety Depression, GERD,  hypertension, gastric bypass 2018, abdominal and bil adductor skin removal   PRECAUTIONS: NA  SUBJECTIVE:                                                                                                                                                                                      SUBJECTIVE STATEMENT:  Pt states that her blood pressure was 214/116 this morning at the doctor's office. She was instructed to take double her blood pressure medication. Her daughter had to undergo another surgery last week unexpectedly and she has not been able to sleep much.   PAIN:  Are you having pain? Yes: NPRS scale: 1.5/10 Pain location: low back/abdomen Pain description: pressure Aggravating factors: random Relieving factors: nothing  SUBJECTIVE STATEMENT: "Every time I cough or sneeze I pee on myself". Small leakage reported. Also with laughing, exercising. Also urgency with leakage too. Wears pads but causes rash and sometimes leaves toilet paper at underwear. Pt reports she has had 5 day history of vaginal discharge with clear/blood tinged mucus noted and hasn't told gyn yet.    Fluid intake: Yes: water - 2 small bottles. Coffee in morning and sometimes poweraide. Rarely soda.      PAIN:  Are you having pain? Yes NPRS scale: 8/10 Pain location:  anterior low abdomen and low back at the same time   Pain type: cramping, tight  Pain description: intermittent    Aggravating factors: unsure seems random. Before would have pain prior to periods and sometimes afterward but now no longer having periods.  Relieving factors: resting   PRECAUTIONS: None   WEIGHT BEARING RESTRICTIONS: No   FALLS:  Has patient fallen in last 6 months? 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   PLOF: Independent   PATIENT GOALS: to have less leakage   PERTINENT HISTORY:  diabetes, Poorly Controlled,  Type 2 Hypothyroidism,   Morbid obesity, Anxiety Depression, GERD,  hypertension, gastric bypass 2018, abdominal and bil adductor skin removal    Sexual abuse: No   BOWEL MOVEMENT: Pain with bowel movement: No Type of bowel movement:Type (Bristol Stool Scale) 4, Frequency daily sometimes 2-3x (largest in morning, others smaller), and Strain No Fully empty rectum: Yes:   Leakage: No Pads: No Fiber supplement: No   URINATION: Pain with urination: No Fully empty bladder: Yes: but sometimes feels like she will go again 30 mins later Stream: Strong Urgency: Yes:   Frequency: with coffee quicker than 2 hours but not usually, not regularly up at night  Leakage: Urge to void, Coughing, Sneezing, Laughing, Exercise, and Lifting Pads: Yes: sometimes   INTERCOURSE: Pain with intercourse:  not active  no painful    PREGNANCY: Vaginal deliveries 1 Tearing No C-section deliveries 2 Currently pregnant No   PROLAPSE: None     OBJECTIVE:   02/21/23: Blood pressure 136/83  01/21/23: A/PROM A/PROM  eval  Flexion WNL/100%  Extension 75%, pain anteriorly  Right lateral flexion 50%  Left lateral flexion 50%  Right rotation 50%  Left rotation 50%   (Blank rows = not tested) PALPATION: significant scar tissue restriction still present in lower abdomen and bil inner thighs, Rt>Lt  PFIQ-7: 57  11/27/22: PELVIC MMT:   MMT eval  Vaginal 4/5, 10 second endurance, 10 repeat contractions  Internal Anal Sphincter    External Anal Sphincter    Puborectalis    Diastasis Recti   (Blank rows = not tested)         TONE: Posterior levator ani restriction/pain   PROLAPSE: Grade 1 anterior vaginal wall laxity  DIAGNOSTIC FINDINGS:     PFIQ-7 38   COGNITION: Overall cognitive status: Within functional limits for tasks assessed                          SENSATION: Light touch: Appears intact Proprioception: Appears intact   MUSCLE LENGTH: Bil hamstrings and adductors limited by 50%     POSTURE: rounded shoulders, forward head, and posterior pelvic tilt   PELVIC ALIGNMENT:   LUMBARAROM/PROM:   A/PROM A/PROM  eval  Flexion Decreased by 50% with abdominal pain reported  Extension Decreased by 25%   Right lateral flexion Decreased by 50% with abdominal pain reported  Left lateral flexion Decreased by 50% with abdominal pain reported  Right rotation Decreased by 50% with abdominal pain reported  Left rotation Decreased by 50% with abdominal pain reported   (Blank rows = not tested)   LOWER EXTREMITY ROM:   WFL   LOWER EXTREMITY MMT:   Bil hips 3+/5, knees and ankles 4+/5   PALPATION:   General  TTP throughout midline of abdomen, profoundly tight tissue restrictions throughout midline and low abdominal quadrants and pt has swelling midline with scarring sites post skin removal surgery with transabdominal and bil adductors. Skin changes at scar sites with darkening and very restricted throughout sites in all directions. Midline lower abdominal quadrant most restricted and swollen with stiffness limiting all mobility over bladder. Bil adductors proximally restricted in all directions due to scar restrictions as well and bilaterally pt has areas of scar sites that are red with white dry skin noted and she reports due to friction.                   External Perineal Exam dryness noted externally. Pt reports she has a  lot of skin allergies and doesn't know what pads to use and has been using toilet paper in panties for leakage. Pt's vulva and clitoral hood moving well without restrictions does have some labia minor adhesion noted however no redness and tissue WFL.                              Internal Pelvic Floor  pt denied all TTP   Patient confirms identification and approves PT to assess internal pelvic floor and treatment Yes      TODAY'S TREATMENT 02/21/23 Manual: Soft tissue mobilization to thoracic paraspinals/bil scapular muscles Thoracic mobilizations PA grade 2-3 Trigger point release to Lt rhomboids    TREATMENT 02/11/23 Manual: Trigger Point Dry-Needling  Treatment instructions: Expect mild to moderate muscle soreness. S/S of pneumothorax if dry needled over a lung field, and to seek immediate medical attention should they occur. Patient verbalized understanding of these instructions and education.  Patient Consent Given: Yes Education handout provided: Previously provided Muscles treated: Bil UT Electrical stimulation performed: No Parameters: N/A Treatment response/outcome: twitch response/release Soft tissue mobilization to Bil UT Therapeutic activities: HEP review Education on relationship between breathing and pelvic floor   TREATMENT 01/21/23 RE-EVAL Manual: Scar tissue mobilization to bil adductors/groin Exercises: Bent knee fall outs 10x bil Unilateral butterfly 2 min bil   PATIENT EDUCATION:  Education details: scar mobility  Person educated: Patient Education method: Explanation, Demonstration, Tactile cues, Verbal cues, and Handouts Education comprehension: verbalized understanding and returned demonstration   HOME EXERCISE PROGRAM: [email or web address]    ASSESSMENT:   CLINICAL IMPRESSION: Pt has had a very difficult week with daughter having another surgery. Her blood pressure was very high this morning at plastic surgery appointment to check arms (214/116). We took blood pressure after sitting for 5 minutes at beginning of session and it had come down to 136/83. She was encouraged to talk to PCP about high blood pressure reading. Exercises kept to minimum with severe blood pressure reading this morning even though it has normalized late this morning during  appointment. We did perform manual techniques to thoracic spine in order to decrease tension, help improve diaphragmatic breathing, and reduce pain. She will continue to benefit from skilled PT intervention in order to increase functional pelvic floor strength and improve coccyx/low back/abdominal pain, returning her to functional activities with less difficulty.    OBJECTIVE IMPAIRMENTS: decreased coordination, decreased endurance, decreased mobility, decreased strength, increased fascial restrictions, increased muscle spasms, impaired flexibility, improper body mechanics, postural dysfunction, and pain.    ACTIVITY LIMITATIONS: carrying, lifting, squatting, continence, and locomotion level   PARTICIPATION LIMITATIONS: community activity   PERSONAL FACTORS: Fitness, Time since onset of injury/illness/exacerbation, and 1 comorbidity: medical history   are also affecting patient's functional outcome.    REHAB POTENTIAL: Good   CLINICAL DECISION MAKING: Stable/uncomplicated   EVALUATION COMPLEXITY: Low     GOALS: Goals reviewed with patient? Yes   SHORT TERM GOALS: Target date: 10/07/22 - updated 10/24/22 updated 11/27/22 - updated 01/21/23 new goal 07/08/23 - updated 02/11/23   Pt to be I with HEP.  Baseline: Goal status:  MET 11/27/22   2.  Pt to be I with scar mobility and diaphragmatic breathing for improved abdominal mobility and decreased tissue restrictions.  Baseline:  Goal status:  MET 11/27/22   3.  Pt to demonstrate improved coordination with pelvic floor and breathing mechanics with body weight squats for pelvic stability and decreased leakage.  Baseline:  Goal status:  MET 11/27/22     LONG TERM GOALS: Target date: 01/09/23 - updated 01/21/23 new goal 07/08/23 - updated 02/11/23   Pt to be I with advanced HEP.  Baseline:  Goal status: IN PROGRESS   2.  Pt to demonstrate improved coordination with pelvic floor and breathing mechanics with 20#  squats for pelvic stability and  decreased leakage.  Baseline:  Goal status:  IN PROGRESS   3.  Pt to report no more than 1 instance of urinary leakage per month for improved QOL and skin integrity.  Baseline:  Goal status:  MET 01/21/23   4.  Pt will have 50% less urgency due to bladder retraining and strengthening  Baseline:  Goal status:  MET 11/27/22   5.  Pt to report improved time between bladder voids to at least 2.5 hours with increase in fluid intake for improved QOL with decreased urinary frequency.   Baseline:  Goal status:  IN PROGRESS   6.  Pt to demonstrate improved thoracic and lumbar mobility to [place] of ROM for improved mobility at spine and allow more mobility at abdomen.  Baseline:  Goal status:  IN PROGRESS  7. Pt will be able to open bil LE without pain or difficulty due to scar tissue restriction.  Baseline:  Goal status: IN PROGRESS  8. Pt will be able to walk and breathe without difficulty in order to get appropriate level of exercise.   Baseline:  Goal status: IN PROGRESS  9. Pt will be able to sit for >30 minutes without having to shift in seat due to tailbone pain in order to travel or go to a movie.   Baseline:  Goal status: IN PROGRESS   PLAN:   PT FREQUENCY: 2x/week   PT DURATION:  16 sessions   PLANNED INTERVENTIONS: Therapeutic exercises, Therapeutic activity, Neuromuscular re-education, Patient/Family education, Self Care, Joint mobilization, Aquatic Therapy, Dry Needling, Electrical stimulation, Spinal mobilization, Cryotherapy, Moist heat, scar mobilization, Taping, Traction, Biofeedback, and Manual therapy   PLAN FOR NEXT SESSION: return to core strengthening; manual techniques as needed.   Julio Alm, PT, DPT04/25/2412:24 PM

## 2023-02-25 ENCOUNTER — Ambulatory Visit: Payer: Medicaid Other

## 2023-02-28 ENCOUNTER — Ambulatory Visit: Payer: Medicaid Other | Attending: Obstetrics and Gynecology

## 2023-02-28 DIAGNOSIS — R102 Pelvic and perineal pain: Secondary | ICD-10-CM | POA: Insufficient documentation

## 2023-02-28 DIAGNOSIS — M6281 Muscle weakness (generalized): Secondary | ICD-10-CM | POA: Diagnosis present

## 2023-02-28 DIAGNOSIS — M62838 Other muscle spasm: Secondary | ICD-10-CM | POA: Diagnosis present

## 2023-02-28 DIAGNOSIS — N393 Stress incontinence (female) (male): Secondary | ICD-10-CM | POA: Diagnosis present

## 2023-02-28 DIAGNOSIS — R293 Abnormal posture: Secondary | ICD-10-CM | POA: Diagnosis present

## 2023-02-28 DIAGNOSIS — R279 Unspecified lack of coordination: Secondary | ICD-10-CM | POA: Diagnosis present

## 2023-02-28 NOTE — Therapy (Addendum)
OUTPATIENT PHYSICAL THERAPY TREATMENT NOTE   Patient Name: [name] MRN: [id number] DOB:[birth year], 47 y.o., female Today's Date: 02/28/2023  PCP: Ethelda Chick, MD REFERRING PROVIDER: Carrington Clamp, MD   END OF SESSION:   PT End of Session - 02/28/23 1147     Visit Number 15    Date for PT Re-Evaluation 07/08/23    Authorization Type Medicaid  healthy blue    Authorization Time Period 02/11/2023-7/13/202    Authorization - Visit Number 3    Authorization - Number of Visits 11    PT Start Time 1147    PT Stop Time 1225    PT Time Calculation (min) 38 min    Activity Tolerance Patient tolerated treatment well    Behavior During Therapy WFL for tasks assessed/performed                 Past Medical History:  Diagnosis Date   Anemia    Anxiety    Blood transfusion without reported diagnosis    Carpal tunnel syndrome, bilateral    Cataract    removed bilateral   Chronic hypertension    Depression    GERD (gastroesophageal reflux disease)    Gestational diabetes    H. pylori infection    dx  09-02-2014-- treated w/ antibiotic   Hypertension    recently low pressures    Hypothyroid    Irregular menstrual cycle    Menorrhagia    PCOS (polycystic ovarian syndrome)    Sleep apnea    Type 2 diabetes mellitus (HCC)    Past Surgical History:  Procedure Laterality Date   CARPAL TUNNEL RELEASE Bilateral 10/07/2014   Procedure: CARPAL TUNNEL RELEASE BILATERAL;  Surgeon: Sharma Covert, MD;  Location: [facility];  Service: Orthopedics;  Laterality: Bilateral;   CESAREAN SECTION  10/29/1993   CESAREAN SECTION WITH BILATERAL TUBAL LIGATION N/A 03/27/2019   Procedure: CESAREAN SECTION WITH BILATERAL TUBAL LIGATION;  Surgeon: Carrington Clamp, MD;  [facility]: [facility];  Service: Obstetrics;  Laterality: N/A;   DILITATION & CURRETTAGE/HYSTROSCOPY WITH NOVASURE ABLATION N/A 11/08/2021   Procedure: DILATATION & CURETTAGE/HYSTEROSCOPY WITH  NOVASURE ABLATION;  Surgeon: Carrington Clamp, MD;  Location: [facility] [place];  Service: Gynecology;  Laterality: N/A;   excessive skin removal     legs,stomach,arms   EYE SURGERY  09/13/2020   cataract bilateral   GASTRIC ROUX-EN-Y N/A 03/12/2017   Procedure: LAPAROSCOPIC ROUX-EN-Y GASTRIC BYPASS WITH UPPER ENDOSCOPY;  Surgeon: Sheliah Hatch De Blanch, MD;  Location: [facility];  Service: General;  Laterality: N/A;   HEMATOMA EVACUATION  12/09/2021   Procedure: EVACUATION HEMATOMA;  Surgeon: Glenna Fellows, MD;  Location: [facility];  Service: Plastics;;   I & D EXTREMITY N/A 12/09/2021   Procedure: IRRIGATION AND DEBRIDEMENT ABDOMEN;  Surgeon: Glenna Fellows, MD;  Location: [facility];  Service: Plastics;  Laterality: N/A;   PANNICULECTOMY N/A 12/08/2021   Procedure: PANNICULECTOMY;  Surgeon: Glenna Fellows, MD;  Location: [facility];  Service: Plastics;  Laterality: N/A;   TUBAL LIGATION  03/27/2019   UPPER GI ENDOSCOPY  03/12/2017   Procedure: UPPER GI ENDOSCOPY;  Surgeon: Sheliah Hatch De Blanch, MD;  Location: [facility];  Service: General;;   Patient Active Problem List   Diagnosis Date Noted   S/P panniculectomy 12/09/2021   Iron deficiency anemia 06/26/2021   Vaginal bleeding in pregnancy, third trimester 03/11/2019   GBS bacteriuria 01/22/2019   Pregnancy of unknown anatomic location 08/10/2018   Intertrigo 11/22/2017  S/P gastric bypass 04/04/2017   Morbid obesity (HCC) 03/12/2017   Hypothyroidism 09/11/2016   Allergic rhinitis due to pollen 06/29/2015   Anxiety and depression 06/29/2015   Gastroesophageal reflux disease without esophagitis 06/29/2015   OSA on CPAP 06/29/2015   History of syphilis 05/23/2015   Irregular menstrual cycle 11/12/2013   POLYCYSTIC OVARIAN DISEASE 06/20/2009    REFERRING DIAG: N39.3 (ICD-10-CM) - Stress incontinence (female) (female)  THERAPY DIAG:  Unspecified lack of coordination  Abnormal posture  Muscle weakness  (generalized)  Other muscle spasm  Pelvic pain  Stress incontinence (female) (female)  Rationale for Evaluation and Treatment Rehabilitation  PERTINENT HISTORY: diabetes, Poorly Controlled, Type 2 Hypothyroidism,   Morbid obesity, Anxiety Depression, GERD,  hypertension, gastric bypass 2018, abdominal and bil adductor skin removal   PRECAUTIONS: NA  SUBJECTIVE:                                                                                                                                                                                      SUBJECTIVE STATEMENT:  Pt states that tailbone continues to feel better, but will still hurt if she sits for very long time. Rt inner thigh tightness still bothers her, but Lt does not bother. She also reports low back is feeling. She has started riding recumbent bicycle at home.   PAIN:  Are you having pain? Yes: NPRS scale: 1.5/10 Pain location: low back/abdomen Pain description: pressure Aggravating factors: random Relieving factors: nothing  SUBJECTIVE STATEMENT: "Every time I cough or sneeze I pee on myself". Small leakage reported. Also with laughing, exercising. Also urgency with leakage too. Wears pads but causes rash and sometimes leaves toilet paper at underwear. Pt reports she has had 5 day history of vaginal discharge with clear/blood tinged mucus noted and hasn't told gyn yet.    Fluid intake: Yes: water - 2 small bottles. Coffee in morning and sometimes poweraide. Rarely soda.      PAIN:  Are you having pain? Yes NPRS scale: 8/10 Pain location:  anterior low abdomen and low back at the same time   Pain type: cramping, tight  Pain description: intermittent    Aggravating factors: unsure seems random. Before would have pain prior to periods and sometimes afterward but now no longer having periods.  Relieving factors: resting   PRECAUTIONS: None   WEIGHT BEARING RESTRICTIONS: No   FALLS:  Has patient fallen in last 6 months?  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   PLOF: Independent   PATIENT GOALS: to have less leakage   PERTINENT  HISTORY:  diabetes, Poorly Controlled, Type 2 Hypothyroidism,   Morbid obesity, Anxiety Depression, GERD,  hypertension, gastric bypass 2018, abdominal and bil adductor skin removal    Sexual abuse: No   BOWEL MOVEMENT: Pain with bowel movement: No Type of bowel movement:Type (Bristol Stool Scale) 4, Frequency daily sometimes 2-3x (largest in morning, others smaller), and Strain No Fully empty rectum: Yes:   Leakage: No Pads: No Fiber supplement: No   URINATION: Pain with urination: No Fully empty bladder: Yes: but sometimes feels like she will go again 30 mins later Stream: Strong Urgency: Yes:   Frequency: with coffee quicker than 2 hours but not usually, not regularly up at night  Leakage: Urge to void, Coughing, Sneezing, Laughing, Exercise, and Lifting Pads: Yes: sometimes   INTERCOURSE: Pain with intercourse:  not active  no painful    PREGNANCY: Vaginal deliveries 1 Tearing No C-section deliveries 2 Currently pregnant No   PROLAPSE: None     OBJECTIVE:   02/21/23: Blood pressure 136/83  01/21/23: A/PROM A/PROM  eval  Flexion WNL/100%  Extension 75%, pain anteriorly  Right lateral flexion 50%  Left lateral flexion 50%  Right rotation 50%  Left rotation 50%   (Blank rows = not tested) PALPATION: significant scar tissue restriction still present in lower abdomen and bil inner thighs, Rt>Lt  PFIQ-7: 57  11/27/22: PELVIC MMT:   MMT eval  Vaginal 4/5, 10 second endurance, 10 repeat contractions  Internal Anal Sphincter    External Anal Sphincter    Puborectalis    Diastasis Recti   (Blank rows = not tested)         TONE: Posterior levator ani restriction/pain   PROLAPSE: Grade 1 anterior vaginal wall laxity  DIAGNOSTIC  FINDINGS:    PFIQ-7 38   COGNITION: Overall cognitive status: Within functional limits for tasks assessed                          SENSATION: Light touch: Appears intact Proprioception: Appears intact   MUSCLE LENGTH: Bil hamstrings and adductors limited by 50%     POSTURE: rounded shoulders, forward head, and posterior pelvic tilt   PELVIC ALIGNMENT:   LUMBARAROM/PROM:   A/PROM A/PROM  eval  Flexion Decreased by 50% with abdominal pain reported  Extension Decreased by 25%   Right lateral flexion Decreased by 50% with abdominal pain reported  Left lateral flexion Decreased by 50% with abdominal pain reported  Right rotation Decreased by 50% with abdominal pain reported  Left rotation Decreased by 50% with abdominal pain reported   (Blank rows = not tested)   LOWER EXTREMITY ROM:   WFL   LOWER EXTREMITY MMT:   Bil hips 3+/5, knees and ankles 4+/5   PALPATION:   General  TTP throughout midline of abdomen, profoundly tight tissue restrictions throughout midline and low abdominal quadrants and pt has swelling midline with scarring sites post skin removal surgery with transabdominal and bil adductors. Skin changes at scar sites with darkening and very restricted throughout sites in all directions. Midline lower abdominal quadrant most restricted and swollen with stiffness limiting all mobility over bladder. Bil adductors proximally restricted in all directions due to scar restrictions as well and bilaterally pt has areas of scar sites that are red with white dry skin noted and she reports due to friction.                   External Perineal Exam dryness noted externally.  Pt reports she has a lot of skin allergies and doesn't know what pads to use and has been using toilet paper in panties for leakage. Pt's vulva and clitoral hood moving well without restrictions does have some labia minor adhesion noted however no redness and tissue WFL.                              Internal  Pelvic Floor pt denied all TTP   Patient confirms identification and approves PT to assess internal pelvic floor and treatment Yes      TODAY'S TREATMENT 02/28/23 Manual: Lower abdominal scar tissue mobilization Exercises: Lower trunk rotation 3 x 10 Bent knee fall outs  TREATMENT 02/21/23 Manual: Soft tissue mobilization to thoracic paraspinals/bil scapular muscles Thoracic mobilizations PA grade 2-3 Trigger point release to Lt rhomboids    TREATMENT 02/11/23 Manual: Trigger Point Dry-Needling  Treatment instructions: Expect mild to moderate muscle soreness. S/S of pneumothorax if dry needled over a lung field, and to seek immediate medical attention should they occur. Patient verbalized understanding of these instructions and education.  Patient Consent Given: Yes Education handout provided: Previously provided Muscles treated: Bil UT Electrical stimulation performed: No Parameters: N/A Treatment response/outcome: twitch response/release Soft tissue mobilization to Bil UT Therapeutic activities: HEP review Education on relationship between breathing and pelvic floor   PATIENT EDUCATION:  Education details: scar mobility  Person educated: Patient Education method: Programmer, multimedia, Demonstration, Tactile cues, Verbal cues, and Handouts Education comprehension: verbalized understanding and returned demonstration   HOME EXERCISE PROGRAM: [email or web address]    ASSESSMENT:   CLINICAL IMPRESSION: Pt doing much better today with lower stress levels and no more episodes of elevated blood pressure. All symptoms are improving, but she does continue to have increased sensation of tightness in lower abdomen down into Rt thigh. Scar tissue mobilization performed to help with this, having some good release. This was followed by exercises to help further promote increased mobility. She will continue to benefit from skilled PT intervention in order to increase functional pelvic floor strength and  improve coccyx/low back/abdominal pain, returning her to functional activities with less difficulty.    OBJECTIVE IMPAIRMENTS: decreased coordination, decreased endurance, decreased mobility, decreased strength, increased fascial restrictions, increased muscle spasms, impaired flexibility, improper body mechanics, postural dysfunction, and pain.    ACTIVITY LIMITATIONS: carrying, lifting, squatting, continence, and locomotion level   PARTICIPATION LIMITATIONS: community activity   PERSONAL FACTORS: Fitness, Time since onset of injury/illness/exacerbation, and 1 comorbidity: medical history   are also affecting patient's functional outcome.    REHAB POTENTIAL: Good   CLINICAL DECISION MAKING: Stable/uncomplicated   EVALUATION COMPLEXITY: Low     GOALS: Goals reviewed with patient? Yes   SHORT TERM GOALS: Target date: 10/07/22 - updated 10/24/22 updated 11/27/22 - updated 01/21/23 new goal 07/08/23 - updated 02/11/23   Pt to be I with HEP.  Baseline: Goal status:  MET 11/27/22   2.  Pt to be I with scar mobility and diaphragmatic breathing for improved abdominal mobility and decreased tissue restrictions.  Baseline:  Goal status:  MET 11/27/22   3.  Pt to demonstrate improved coordination with pelvic floor and breathing mechanics with body weight squats for pelvic stability and decreased leakage.  Baseline:  Goal status:  MET 11/27/22     LONG TERM GOALS: Target date: 01/09/23 - updated 01/21/23 new goal 07/08/23 - updated 02/11/23   Pt to be I with advanced HEP.  Baseline:  Goal  status: IN PROGRESS   2.  Pt to demonstrate improved coordination with pelvic floor and breathing mechanics with 20#  squats for pelvic stability and decreased leakage.  Baseline:  Goal status:  IN PROGRESS   3.  Pt to report no more than 1 instance of urinary leakage per month for improved QOL and skin integrity.  Baseline:  Goal status:  MET 01/21/23   4.  Pt will have 50% less urgency due to bladder  retraining and strengthening  Baseline:  Goal status:  MET 11/27/22   5.  Pt to report improved time between bladder voids to at least 2.5 hours with increase in fluid intake for improved QOL with decreased urinary frequency.   Baseline:  Goal status:  IN PROGRESS   6.  Pt to demonstrate improved thoracic and lumbar mobility to [facility] of ROM for improved mobility at spine and allow more mobility at abdomen.  Baseline:  Goal status:  IN PROGRESS  7. Pt will be able to open bil LE without pain or difficulty due to scar tissue restriction.  Baseline:  Goal status: IN PROGRESS  8. Pt will be able to walk and breathe without difficulty in order to get appropriate level of exercise.   Baseline:  Goal status: IN PROGRESS  9. Pt will be able to sit for >30 minutes without having to shift in seat due to tailbone pain in order to travel or go to a movie.   Baseline:  Goal status: IN PROGRESS   PLAN:   PT FREQUENCY: 2x/week   PT DURATION:  16 sessions   PLANNED INTERVENTIONS: Therapeutic exercises, Therapeutic activity, Neuromuscular re-education, Patient/Family education, Self Care, Joint mobilization, Aquatic Therapy, Dry Needling, Electrical stimulation, Spinal mobilization, Cryotherapy, Moist heat, scar mobilization, Taping, Traction, Biofeedback, and Manual therapy   PLAN FOR NEXT SESSION: Hip and core strengthening; scar tissue mobilization as needed.    Julio Alm, PT, DPT05/11/2410:28 PM  PHYSICAL THERAPY DISCHARGE SUMMARY  Visits from Start of Care: 15  Current functional level related to goals / functional outcomes: Independent   Remaining deficits: See above   Education / Equipment: HEP   Patient agrees to discharge. Patient goals were partially met. Patient is being discharged due to not returning since the last visit.  Julio Alm, PT, DPT01/30/257:54 AM

## 2023-03-04 ENCOUNTER — Ambulatory Visit: Payer: Medicaid Other
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
## 2023-08-19 ENCOUNTER — Ambulatory Visit: Payer: Medicaid Other | Admitting: Family Medicine

## 2023-10-09 ENCOUNTER — Encounter (HOSPITAL_COMMUNITY): Payer: Self-pay | Admitting: *Deleted

## 2024-02-07 ENCOUNTER — Other Ambulatory Visit: Payer: Self-pay | Admitting: Obstetrics and Gynecology

## 2024-02-07 DIAGNOSIS — Z01818 Encounter for other preprocedural examination: Secondary | ICD-10-CM

## 2024-02-25 ENCOUNTER — Ambulatory Visit: Admitting: Orthopaedic Surgery

## 2024-02-25 ENCOUNTER — Encounter: Payer: Self-pay | Admitting: Hematology and Oncology

## 2024-02-25 ENCOUNTER — Telehealth: Payer: Self-pay

## 2024-02-25 ENCOUNTER — Other Ambulatory Visit (INDEPENDENT_AMBULATORY_CARE_PROVIDER_SITE_OTHER): Payer: Self-pay

## 2024-02-25 VITALS — Ht 61.5 in | Wt 219.6 lb

## 2024-02-25 DIAGNOSIS — M7711 Lateral epicondylitis, right elbow: Secondary | ICD-10-CM

## 2024-02-25 DIAGNOSIS — M25551 Pain in right hip: Secondary | ICD-10-CM

## 2024-02-25 NOTE — Telephone Encounter (Signed)
 Patient is scheduled for a right hip injection with Dr. Vaughn Georges on 03/09/2024.

## 2024-02-25 NOTE — Telephone Encounter (Signed)
 Dr Christiane Cowing saw patient this morning and would like for patient to be seen for right hip injection. Can you reach out to paitent to get her scheduled?
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
## 2024-03-04 ENCOUNTER — Other Ambulatory Visit: Payer: Self-pay

## 2024-03-04 ENCOUNTER — Ambulatory Visit: Attending: Orthopaedic Surgery | Admitting: Physical Therapy

## 2024-03-04 ENCOUNTER — Encounter: Payer: Self-pay | Admitting: Physical Therapy

## 2024-03-04 DIAGNOSIS — M7711 Lateral epicondylitis, right elbow: Secondary | ICD-10-CM | POA: Diagnosis present

## 2024-03-04 DIAGNOSIS — M25551 Pain in right hip: Secondary | ICD-10-CM

## 2024-03-04 DIAGNOSIS — M25521 Pain in right elbow: Secondary | ICD-10-CM

## 2024-03-04 DIAGNOSIS — M6281 Muscle weakness (generalized): Secondary | ICD-10-CM

## 2024-03-04 NOTE — Therapy (Signed)
 OUTPATIENT PHYSICAL THERAPY EVALUATION   Patient Name: [name] MRN: [id number] DOB:December 06, 1975, 48 y.o., female Today's Date: 03/04/2024  END OF SESSION:  PT End of Session - 03/04/24 0959     Visit Number 1    Number of Visits 13    Date for PT Re-Evaluation 04/15/24    Authorization Type [employer]    Authorization Time Period auth tbd    PT Start Time 1000    PT Stop Time 1045    PT Time Calculation (min) 45 min    Activity Tolerance Patient tolerated treatment well             Past Medical History:  Diagnosis Date   Anemia    Anxiety    Blood transfusion without reported diagnosis    Carpal tunnel syndrome, bilateral    Cataract    removed bilateral   Chronic hypertension    Depression    GERD (gastroesophageal reflux disease)    Gestational diabetes    H. pylori infection    dx  09-02-2014-- treated w/ antibiotic   Hypertension    recently low pressures    Hypothyroid    Irregular menstrual cycle    Menorrhagia    PCOS (polycystic ovarian syndrome)    Sleep apnea    Type 2 diabetes mellitus (HCC)    Past Surgical History:  Procedure Laterality Date   CARPAL TUNNEL RELEASE Bilateral 10/07/2014   Procedure: CARPAL TUNNEL RELEASE BILATERAL;  Surgeon: Shellie Dials, MD;  Location: [facility];  Service: Orthopedics;  Laterality: Bilateral;   CESAREAN SECTION  10/29/1993   CESAREAN SECTION WITH BILATERAL TUBAL LIGATION N/A 03/27/2019   Procedure: CESAREAN SECTION WITH BILATERAL TUBAL LIGATION;  Surgeon: Matt Song, MD;  Location: [facility];  Service: Obstetrics;  Laterality: N/A;   DILITATION & CURRETTAGE/HYSTROSCOPY WITH NOVASURE ABLATION N/A 11/08/2021   Procedure: DILATATION & CURETTAGE/HYSTEROSCOPY WITH NOVASURE ABLATION;  Surgeon: Matt Song, MD;  Location: [facility] [place];  Service: Gynecology;  Laterality: N/A;   excessive skin removal     legs,stomach,arms   EYE SURGERY  09/13/2020    cataract bilateral   GASTRIC ROUX-EN-Y N/A 03/12/2017   Procedure: LAPAROSCOPIC ROUX-EN-Y GASTRIC BYPASS WITH UPPER ENDOSCOPY;  Surgeon: Dorrie Gaudier Alphonso Aschoff, MD;  Location: [facility];  Service: General;  Laterality: N/A;   HEMATOMA EVACUATION  12/09/2021   Procedure: EVACUATION HEMATOMA;  Surgeon: Alger Infield, MD;  Location: [facility];  Service: Plastics;;   I & D EXTREMITY N/A 12/09/2021   Procedure: IRRIGATION AND DEBRIDEMENT ABDOMEN;  Surgeon: Alger Infield, MD;  Location: [facility];  Service: Plastics;  Laterality: N/A;   PANNICULECTOMY N/A 12/08/2021   Procedure: PANNICULECTOMY;  Surgeon: Alger Infield, MD;  [facility];  Service: Plastics;  Laterality: N/A;   TUBAL LIGATION  03/27/2019   UPPER GI ENDOSCOPY  03/12/2017   Procedure: UPPER GI ENDOSCOPY;  Surgeon: Dorrie Gaudier Alphonso Aschoff, MD;  Location: [facility];  Service: General;;   Patient Active Problem List   Diagnosis Date Noted   S/P panniculectomy 12/09/2021   Iron  deficiency anemia 06/26/2021   Vaginal bleeding in pregnancy, third trimester 03/11/2019   GBS bacteriuria 01/22/2019   Pregnancy of unknown anatomic location 08/10/2018   Intertrigo 11/22/2017   S/P gastric bypass 04/04/2017   Morbid obesity (HCC) 03/12/2017   Hypothyroidism 09/11/2016   Allergic rhinitis due to pollen 06/29/2015   Anxiety and depression 06/29/2015   Gastroesophageal reflux disease without esophagitis 06/29/2015  OSA on CPAP 06/29/2015   History of syphilis 05/23/2015   Irregular menstrual cycle 11/12/2013   POLYCYSTIC OVARIAN DISEASE 06/20/2009    PCP: Valere Gata, MD  REFERRING PROVIDER: Wes Hamman, MD  REFERRING DIAG: [phone] (ICD-10-CM) - Pain in right hip M77.11 (ICD-10-CM) - Lateral epicondylitis, right elbow  THERAPY DIAG:  No diagnosis found.  Rationale for Evaluation and Treatment: Rehabilitation  ONSET DATE: ~ 8 years ago; gradual onset  SUBJECTIVE:                                                                                                                                                                                       SUBJECTIVE STATEMENT: Pt states symptoms began around 8 years ago - had gastric bypass around 2018, she feels like symptoms began around same time she started trying to lose weight. Reports issues with carpal tunnel and UE numbness since around 2000. Pt states symptoms are worse in the mornings, and with work activities. She reports L sided symptoms as well but more so on R. Stopped working about a month ago, states symptoms have improved some since. R lateral thigh numbness at times, less so on L side, feels this has also improved since she stopped working. Reports cramping in feet and shins at times. Pt does report hx of known incontinence, has received pelvic PT and is in communication with providers. Has surgery scheduled for June 25th per pt. Pt describes her incontinence as chronic/stable. Hand dominance: Right  PERTINENT HISTORY: anemia, anxiety, HTN, depression, GERD, PCOS, DM2, hx gastric bypass  PAIN:  Are you having pain: 3/10 Location/description: R lateral elbow to wrist; R hip and posterior thigh, throbbing Best-worst over past week: 3-7/10  - aggravating factors: standing, walking, picking up child, transfers - Easing factors: medication, massage  PRECAUTIONS: None  WEIGHT BEARING RESTRICTIONS: No  FALLS:  Has patient fallen in last 6 months? No but does endorse near falls due to jolts of hip pain  LIVING ENVIRONMENT: Lives with husband and 90 year old. 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  DIAGNOSTIC FINDINGS:  02/25/24 hip XR: "X-rays of the pelvis and right hip show mild to moderate osteoarthritis  with decreased joint space. "  PATIENT SURVEYS:  LEFS 41/80; QuickDASH deferred given time constraints  COGNITION: Overall  cognitive status: Within functional limits for tasks assessed     SENSATION: Reports diminished sensation BIL lateral thighs and R lateral shoulder, otherwise LT intact BIL UE/LE Negative hoffman or tromner signs  No clonus either LE  POSTURE: Rounded shoulders, forward head  UPPER EXTREMITY ROM:   ROM Right eval Left eval  Shoulder flexion    Shoulder extension    Shoulder abduction    Shoulder adduction    Shoulder internal rotation    Shoulder external rotation    Elbow flexion full   Elbow extension Full with mild pain   Wrist flexion    Wrist extension    Wrist ulnar deviation    Wrist radial deviation    Wrist pronation 70   Wrist supination 80   (Blank rows = not tested)   LOWER EXTREMITY ROM:      Right eval Left eval  Hip flexion    Hip extension    Hip internal rotation    Hip external rotation    Knee extension    Knee flexion    (Blank rows = not tested) (Key: WFL = within functional limits not formally assessed, * = concordant pain, s = stiffness/stretching sensation, NT = not tested)  Comments:    UPPER EXTREMITY MMT:  MMT Right eval Left eval  Elbow flexion    Elbow extension    Wrist flexion    Wrist extension    Wrist ulnar deviation    Wrist radial deviation    Wrist pronation    Wrist supination    Grip strength (lbs) 20# 10#  (Blank rows = not tested)  LOWER EXTREMITY MMT:    MMT Right eval Left eval  Hip flexion 3+ * 4-  Hip abduction (modified sitting) 4 4  Hip internal rotation    Hip external rotation    Knee flexion 4- * 4  Knee extension 4-* 4  Ankle dorsiflexion     (Blank rows = not tested) (Key: WFL = within functional limits not formally assessed, * = concordant  pain, s = stiffness/stretching sensation, NT = not tested)  Comments:    SHOULDER SPECIAL TESTS: Deferred given time constraints  JOINT MOBILITY TESTING:  Deferred given time constraints  FUNCTIONAL TEST: 5xSTS 26 sec with LE pain, UE support Gait mechanics w/ increased lateral weight shifting, reduced stance time on RLE, reduced gait speed/cadence                                                                                                                             TREATMENT DATE:  [facility] Adult PT Treatment:                                                DATE: 03/04/24 Therapeutic Exercise: Towel gripping, STS practice reps w/ education on appropriate setup/performance,  handout provided  Self Care: Education on exam findings as they relate to symptom behavior, rationale for interventions, relevant anatomy/physiology, and strategies to monitor/adjust HEP for improved tolerance as needed    PATIENT EDUCATION: Education details: Pt education on PT impairments, prognosis, and POC. Informed consent. Rationale for interventions, safe/appropriate HEP performance Person educated: Patient Education method: Explanation, Demonstration, Tactile cues, Verbal cues Education comprehension: verbalized understanding, returned demonstration, verbal cues required, tactile cues required, and needs further education    HOME EXERCISE PROGRAM: Access Code: [email or web address] URL: [email or web address] Date: 03/04/2024 Prepared by: Mayme Spearman  Exercises - Seated Gripping Towel  - 2-3 x daily - 1 sets - 8 reps - Sit to Stand with Armchair  - 2-3 x daily - 1 sets - 5 reps  ASSESSMENT:  CLINICAL IMPRESSION: Patient is a 48 y.o. woman who was seen today for physical therapy evaluation and treatment for R hip and R elbow pain. Pt endorses chronic issues worsening to the point where she is no longer working, although she endorses improvement in symptoms in last month without working. She endorses  difficulty w/ lifting, standing, and walking. On exam she demonstrates concordant limitations in grip strength, LE strength, and altered kinematics w/ gait/transfers. Tolerates exam/HEP well without adverse event or increase in resting pain. Recommend trial of skilled PT to address aforementioned deficits with aim of improving functional tolerance and reducing pain with typical activities. Pt departs today's session in no acute distress, all voiced concerns/questions addressed appropriately from PT perspective.    OBJECTIVE IMPAIRMENTS: Abnormal gait, decreased activity tolerance, decreased balance, decreased endurance, decreased mobility, difficulty walking, decreased ROM, decreased strength, impaired perceived functional ability, impaired sensation, impaired UE functional use, improper body mechanics, postural dysfunction, and pain.   ACTIVITY LIMITATIONS: carrying, lifting, bending, standing, squatting, sleeping, stairs, transfers, and locomotion level  PARTICIPATION LIMITATIONS: meal prep, cleaning, laundry, community activity, and occupation  PERSONAL FACTORS: Time since onset of injury/illness/exacerbation and 3+ comorbidities: anemia, anxiety, HTN, depression, GERD, PCOS, DM2, hx gastric bypass  are also affecting patient's functional outcome.   REHAB POTENTIAL: Fair given chronicity and comorbidities  CLINICAL DECISION MAKING: Evolving/moderate complexity  EVALUATION COMPLEXITY: Moderate   GOALS:   SHORT TERM GOALS: Target date: 03/25/2024  Pt will demonstrate appropriate understanding and performance of initially prescribed HEP in order to facilitate improved independence with management of symptoms.  Baseline: HEP established   Goal status: INITIAL   2. Pt will report at least 25% improvement in overall pain levels over past week in order to facilitate improved tolerance to typical daily activities.   Baseline: 3-7/10  Goal status: INITIAL    LONG TERM GOALS: Target date:  04/15/2024  Pt will score 55/80 or greater on LEFS in order to demonstrate improved perception of function due to symptoms (MCID 9 pts) Baseline: 41/80 Goal status: INITIAL  2.  Pt will demonstrate at least 50% improvement in BIL grip strength to facilitate improved functional tolerance.  Baseline: see MMT chart above Goal status: INITIAL  3.  Pt will report at least 50% decrease in overall pain levels in past week in order to facilitate improved tolerance to basic ADLs/mobility.   Baseline: 3-7/10  Goal status: INITIAL    4.  Pt will be able to perform 5xSTS in less than or equal to 16sec in order to demonstrate reduced fall risk and improved functional independence (MCID 5xSTS = 2.3 sec). Baseline: 26 sec, UE support and pain Goal status: INITIAL   5. Pt will demonstrate at least 4/5  MMT throughout tested groups in order to facilitate improved functional tolerance.  Baseline: see MMT chart above  Goal status: INITIAL  PLAN:  PT FREQUENCY: 2x/week  PT DURATION: 6 weeks  PLANNED INTERVENTIONS: 97164- PT Re-evaluation, 97750- Physical Performance Testing, 97110-Therapeutic exercises, 97530- Therapeutic activity, [id number]- Neuromuscular re-education, 97535- Self Care, 16109- Manual therapy, [phone]- Gait training, [phone]- Aquatic Therapy, [phone]- Electrical stimulation (unattended), Patient/Family education, Balance training, Stair training, Taping, Dry Needling, Joint mobilization, Spinal mobilization, Cryotherapy, and Moist heat  PLAN FOR NEXT SESSION: Review/update HEP PRN. Work on [employer] exercises as appropriate with emphasis on grip/UE strength, LE strength, and balance/transfers. Symptom modification strategies as indicated/appropriate.    Lovett Ruck PT, DPT 03/04/2024 1:43 PM     For all possible CPT codes, reference the Planned Interventions line above.     Check all conditions that are expected to impact treatment: {Conditions expected to impact  treatment:Musculoskeletal disorders

## 2024-03-09 ENCOUNTER — Encounter: Payer: Self-pay | Admitting: Sports Medicine

## 2024-03-09 ENCOUNTER — Ambulatory Visit: Admitting: Sports Medicine

## 2024-03-09 ENCOUNTER — Other Ambulatory Visit: Payer: Self-pay

## 2024-03-09 DIAGNOSIS — G8929 Other chronic pain: Secondary | ICD-10-CM | POA: Diagnosis not present

## 2024-03-09 DIAGNOSIS — M25551 Pain in right hip: Secondary | ICD-10-CM

## 2024-03-09 DIAGNOSIS — M1611 Unilateral primary osteoarthritis, right hip: Secondary | ICD-10-CM

## 2024-03-09 MED ORDER — METHYLPREDNISOLONE ACETATE 40 MG/ML IJ SUSP
40.0000 mg | INTRAMUSCULAR | Status: AC | PRN
  Administered 2024-03-09: 40 mg via INTRA_ARTICULAR

## 2024-03-09 MED ORDER — LIDOCAINE HCL 1 % IJ SOLN
4.0000 mL | INTRAMUSCULAR | Status: AC | PRN
  Administered 2024-03-09: 4 mL

## 2024-03-09 NOTE — Progress Notes (Signed)
   Procedure Note  Patient: [name]             Date of Birth: 1976-03-13           MRN: [id number]             Visit Date: 03/09/2024  Procedures: Visit Diagnoses:  1. Chronic right hip pain   2. Unilateral primary osteoarthritis, right hip    Large Joint Inj: R hip joint on 03/09/2024 8:50 AM Indications: pain Details: 22 G 3.5 in needle, ultrasound-guided anterior approach Medications: 4 mL lidocaine  1 %; 40 mg methylPREDNISolone  acetate 40 MG/ML Outcome: tolerated well, no immediate complications  Procedure: US -guided intra-articular hip injection, Right After discussion on risks/benefits/indications and informed verbal consent was obtained, a timeout was performed. Patient was lying supine on exam table. The hip was cleaned with betadine  and alcohol swabs. Then utilizing ultrasound guidance, the patient's femoral head and neck junction was identified and subsequently injected with 4:1 lidocaine :depomedrol via an in-plane approach with ultrasound visualization of the injectate administered into the hip joint. Patient tolerated procedure well without immediate complications.  Procedure, treatment alternatives, risks and benefits explained, specific risks discussed. Consent was given by the patient. Immediately prior to procedure a time out was called to verify the correct patient, procedure, equipment, support staff and site/side marked as required. Patient was prepped and draped in the usual sterile fashion.     - patient tolerated procedure well, discussed post-injection protocol - follow-up with Dr. Christiane Cowing as indicated; I am happy to see them as needed  Shauna Del, DO Primary Care Sports Medicine Physician  [facility] - Orthopedics  This note was dictated using Dragon naturally speaking software and may contain errors in syntax, spelling, or content which have not been identified prior to signing this note.

## 2024-03-16 NOTE — Therapy (Addendum)
 OUTPATIENT PHYSICAL THERAPY TREATMENT   Patient Name: [name] MRN: [id number] DOB:[birth year], 48 y.o., female Today's Date: 03/17/2024  END OF SESSION:  PT End of Session - 03/17/24 1530     Visit Number 2    Number of Visits 13    Date for PT Re-Evaluation 04/15/24    Authorization Type [employer]    Authorization Time Period 7 visits 03/04/24-05/02/24    Authorization - Visit Number 1    Authorization - Number of Visits 7    PT Start Time 1530    PT Stop Time 1550    PT Time Calculation (min) 20 min    Activity Tolerance Patient tolerated treatment well              Past Medical History:  Diagnosis Date   Anemia    Anxiety    Blood transfusion without reported diagnosis    Carpal tunnel syndrome, bilateral    Cataract    removed bilateral   Chronic hypertension    Depression    GERD (gastroesophageal reflux disease)    Gestational diabetes    H. pylori infection    dx  09-02-2014-- treated w/ antibiotic   Hypertension    recently low pressures    Hypothyroid    Irregular menstrual cycle    Menorrhagia    PCOS (polycystic ovarian syndrome)    Sleep apnea    Type 2 diabetes mellitus (HCC)    Past Surgical History:  Procedure Laterality Date   CARPAL TUNNEL RELEASE Bilateral 10/07/2014   Procedure: CARPAL TUNNEL RELEASE BILATERAL;  Surgeon: Shellie Dials, MD;  Location: [facility];  Service: Orthopedics;  Laterality: Bilateral;   CESAREAN SECTION  10/29/1993   CESAREAN SECTION WITH BILATERAL TUBAL LIGATION N/A 03/27/2019   Procedure: CESAREAN SECTION WITH BILATERAL TUBAL LIGATION;  Surgeon: Matt Song, MD;  Location: [facility];  Service: Obstetrics;  Laterality: N/A;   DILITATION & CURRETTAGE/HYSTROSCOPY WITH NOVASURE ABLATION N/A 11/08/2021   Procedure: DILATATION & CURETTAGE/HYSTEROSCOPY WITH NOVASURE ABLATION;  Surgeon: Matt Song, MD;  Location: [facility] [place];  Service: Gynecology;   Laterality: N/A;   excessive skin removal     legs,stomach,arms   EYE SURGERY  09/13/2020   cataract bilateral   GASTRIC ROUX-EN-Y N/A 03/12/2017   Procedure: LAPAROSCOPIC ROUX-EN-Y GASTRIC BYPASS WITH UPPER ENDOSCOPY;  Surgeon: Dorrie Gaudier Alphonso Aschoff, MD;  Location: [facility];  Service: General;  Laterality: N/A;   HEMATOMA EVACUATION  12/09/2021   Procedure: EVACUATION HEMATOMA;  Surgeon: Alger Infield, MD;  Location: [facility];  Service: Plastics;;   I & D EXTREMITY N/A 12/09/2021   Procedure: IRRIGATION AND DEBRIDEMENT ABDOMEN;  Surgeon: Alger Infield, MD;  Location: [facility];  Service: Plastics;  Laterality: N/A;   PANNICULECTOMY N/A 12/08/2021   Procedure: PANNICULECTOMY;  Surgeon: Alger Infield, MD;  Location: [facility];  Service: Plastics;  Laterality: N/A;   TUBAL LIGATION  03/27/2019   UPPER GI ENDOSCOPY  03/12/2017   Procedure: UPPER GI ENDOSCOPY;  Surgeon: Dorrie Gaudier Alphonso Aschoff, MD;  Location: [facility];  Service: General;;   Patient Active Problem List   Diagnosis Date Noted   S/P panniculectomy 12/09/2021   Iron  deficiency anemia 06/26/2021   Vaginal bleeding in pregnancy, third trimester 03/11/2019   GBS bacteriuria 01/22/2019   Pregnancy of unknown anatomic location 08/10/2018   Intertrigo 11/22/2017   S/P gastric bypass 04/04/2017   Morbid obesity (HCC) 03/12/2017   Hypothyroidism 09/11/2016   Allergic rhinitis due  to pollen 06/29/2015   Anxiety and depression 06/29/2015   Gastroesophageal reflux disease without esophagitis 06/29/2015   OSA on CPAP 06/29/2015   History of syphilis 05/23/2015   Irregular menstrual cycle 11/12/2013   POLYCYSTIC OVARIAN DISEASE 06/20/2009    PCP: Valere Gata, MD  REFERRING PROVIDER: Wes Hamman, MD  REFERRING DIAG: [phone] (ICD-10-CM) - Pain in right hip M77.11 (ICD-10-CM) - Lateral epicondylitis, right elbow  THERAPY DIAG:  Pain in right hip  Pain in right elbow  Muscle weakness  (generalized)  Rationale for Evaluation and Treatment: Rehabilitation  ONSET DATE: ~ 8 years ago; gradual onset  SUBJECTIVE:                                                                                                                                                                                      SUBJECTIVE STATEMENT: 03/17/2024 Pt states she got injection last week and resolved hip pain until this morning, but remains improved (about 2/10). Does endorse a headache/HTN last night that resolved with taking her BP medication.   Per eval - Pt states symptoms began around 8 years ago - had gastric bypass around 2018, she feels like symptoms began around same time she started trying to lose weight. Reports issues with carpal tunnel and UE numbness since around 2000. Pt states symptoms are worse in the mornings, and with work activities. She reports L sided symptoms as well but more so on R. Stopped working about a month ago, states symptoms have improved some since. R lateral thigh numbness at times, less so on L side, feels this has also improved since she stopped working. Reports cramping in feet and shins at times. Pt does report hx of known incontinence, has received pelvic PT and is in communication with providers. Has surgery scheduled for June 25th per pt. Pt describes her incontinence as chronic/stable. Hand dominance: Right  PERTINENT HISTORY: anemia, anxiety, HTN, depression, GERD, PCOS, DM2, hx gastric bypass  PAIN:  Are you having pain: 2/10  Per eval -  Location/description: R lateral elbow to wrist; R hip and posterior thigh, throbbing Best-worst over past week: 3-7/10  - aggravating factors: standing, walking, picking up child, transfers - Easing factors: medication, massage  PRECAUTIONS: None  WEIGHT BEARING RESTRICTIONS: No  FALLS:  Has patient fallen in last 6 months? No but does endorse near falls due to jolts of hip pain  LIVING ENVIRONMENT: Lives with husband  and 25 year old. 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  DIAGNOSTIC FINDINGS:  02/25/24 hip XR: "X-rays of the pelvis and right hip show mild to moderate osteoarthritis  with decreased joint space. "  PATIENT SURVEYS:  LEFS 41/80; QuickDASH deferred given time constraints  COGNITION: Overall cognitive status: Within functional limits for tasks assessed     SENSATION: Reports diminished sensation BIL lateral thighs and R lateral shoulder, otherwise LT intact BIL UE/LE Negative hoffman or tromner signs  No clonus either LE  POSTURE: Rounded shoulders, forward head  UPPER EXTREMITY ROM:   ROM Right eval Left eval  Shoulder flexion    Shoulder extension    Shoulder abduction    Shoulder adduction    Shoulder internal rotation    Shoulder external rotation    Elbow flexion full   Elbow extension Full with mild pain   Wrist flexion    Wrist extension    Wrist ulnar deviation    Wrist radial deviation    Wrist pronation 70   Wrist supination 80   (Blank rows = not tested)   LOWER EXTREMITY ROM:      Right eval Left eval  Hip flexion    Hip extension    Hip internal rotation    Hip external rotation    Knee extension    Knee flexion    (Blank rows = not tested) (Key: WFL = within functional limits not formally assessed, * = concordant pain, s = stiffness/stretching sensation, NT = not tested)  Comments:    UPPER EXTREMITY MMT:  MMT Right eval Left eval  Elbow flexion    Elbow extension    Wrist flexion    Wrist extension    Wrist ulnar deviation    Wrist radial deviation    Wrist pronation    Wrist  supination    Grip strength (lbs) 20# 10#  (Blank rows = not tested)  LOWER EXTREMITY MMT:    MMT Right eval Left eval  Hip flexion 3+ * 4-  Hip abduction (modified sitting) 4 4  Hip internal rotation    Hip external rotation    Knee flexion 4- * 4  Knee extension 4-* 4  Ankle dorsiflexion     (Blank rows = not tested) (Key: WFL = within functional limits not formally assessed, * = concordant pain, s = stiffness/stretching sensation, NT = not tested)  Comments:    SHOULDER SPECIAL TESTS: Deferred given time constraints  JOINT MOBILITY TESTING:  Deferred given time constraints  FUNCTIONAL TEST: 5xSTS 26 sec with LE pain, UE support Gait mechanics w/ increased lateral weight shifting, reduced stance time on RLE, reduced gait speed/cadence  TREATMENT DATE:  [facility] Adult PT Treatment:                                                DATE: 03/17/24 Vitals: BP LUE 184/105 asymptomatic; after 4 min rest ~ 190/108  Self Care: Monitoring vitals, education/discussion re: vitals as they pertain to PT, red flags and appropriate action should they occur, communication w/ provider; see assessment below   [facility] Adult PT Treatment:                                                DATE: 03/04/24 Therapeutic Exercise: Towel gripping, STS practice reps w/ education on appropriate setup/performance, handout provided  Self Care: Education on exam findings as they relate to symptom behavior, rationale for interventions, relevant anatomy/physiology, and strategies to monitor/adjust HEP for improved tolerance as needed    PATIENT EDUCATION: Education details: rationale for interventions, HEP  Person educated: Patient Education method: Explanation, Demonstration, Tactile cues, Verbal cues Education comprehension: verbalized understanding, returned demonstration, verbal cues  required, tactile cues required, and needs further education      HOME EXERCISE PROGRAM: Access Code: [email or web address] URL: [email or web address] Date: 03/04/2024 Prepared by: Mayme Spearman  Exercises - Seated Gripping Towel  - 2-3 x daily - 1 sets - 8 reps - Sit to Stand with Armchair  - 2-3 x daily - 1 sets - 5 reps  ASSESSMENT:  CLINICAL IMPRESSION: 03/17/2024 Pt arrived and reported improved hip pain. She did endorse HTN last night with systolic reportedly >249mmHg, accompanied by headache. This resolved, but we did check BP at beginning of session with readings as above. We discussed BP and pt stated at best she will usually run 150s systolic, she stated today's readings were typical for her and she is in communication with her providers about it. Discussed vitals as they pertain to PT, she provided verbal consent to reach out to her PCP (Dr. Rocky Cipro with [facility]) to discuss steps forward with PT. Given vitals outside of appropriate parameters we deferred further treatment today. She denied any symptoms during session; education was provided on red flags and appropriate action should they occur. Pt departed today's session in no acute distress, all voiced questions/concerns addressed appropriately from PT perspective.   Did reach out to Dr. Shirlie Dove office after pt departure, was able to speak with office who stated they will have provider call back as able, appreciate their assistance.   03/18/24 addendum: Received call back this morning from [place] at [facility], discussed BP readings, she states Dr. Felipe Horton will reach out to patient to discuss and get back in touch with us  via office phone or fax for guidance going forward. Appreciate their assistance.   Per eval - Patient is a 48 y.o. woman who was seen today for physical therapy evaluation and treatment for R hip and R elbow pain. Pt endorses chronic issues worsening to the point where she is no longer working, although she  endorses improvement in symptoms in last month without working. She endorses difficulty w/ lifting, standing, and walking. On exam she demonstrates concordant limitations in grip strength, LE strength, and altered kinematics w/ gait/transfers. Tolerates exam/HEP well without adverse event or increase in resting pain. Recommend trial  of skilled PT to address aforementioned deficits with aim of improving functional tolerance and reducing pain with typical activities. Pt departs today's session in no acute distress, all voiced concerns/questions addressed appropriately from PT perspective.    OBJECTIVE IMPAIRMENTS: Abnormal gait, decreased activity tolerance, decreased balance, decreased endurance, decreased mobility, difficulty walking, decreased ROM, decreased strength, impaired perceived functional ability, impaired sensation, impaired UE functional use, improper body mechanics, postural dysfunction, and pain.   ACTIVITY LIMITATIONS: carrying, lifting, bending, standing, squatting, sleeping, stairs, transfers, and locomotion level  PARTICIPATION LIMITATIONS: meal prep, cleaning, laundry, community activity, and occupation  PERSONAL FACTORS: Time since onset of injury/illness/exacerbation and 3+ comorbidities: anemia, anxiety, HTN, depression, GERD, PCOS, DM2, hx gastric bypass are also affecting patient's functional outcome.   REHAB POTENTIAL: Fair given chronicity and comorbidities  CLINICAL DECISION MAKING: Evolving/moderate complexity  EVALUATION COMPLEXITY: Moderate   GOALS:   SHORT TERM GOALS: Target date: 03/25/2024  Pt will demonstrate appropriate understanding and performance of initially prescribed HEP in order to facilitate improved independence with management of symptoms.  Baseline: HEP established   Goal status: INITIAL   2. Pt will report at least 25% improvement in overall pain levels over past week in order to facilitate improved tolerance to typical daily activities.    Baseline: 3-7/10  Goal status: INITIAL    LONG TERM GOALS: Target date: 04/15/2024  Pt will score 55/80 or greater on LEFS in order to demonstrate improved perception of function due to symptoms (MCID 9 pts) Baseline: 41/80 Goal status: INITIAL  2.  Pt will demonstrate at least 50% improvement in BIL grip strength to facilitate improved functional tolerance.  Baseline: see MMT chart above Goal status: INITIAL  3.  Pt will report at least 50% decrease in overall pain levels in past week in order to facilitate improved tolerance to basic ADLs/mobility.   Baseline: 3-7/10  Goal status: INITIAL    4.  Pt will be able to perform 5xSTS in less than or equal to 16sec in order to demonstrate reduced fall risk and improved functional independence (MCID 5xSTS = 2.3 sec). Baseline: 26 sec, UE support and pain Goal status: INITIAL   5. Pt will demonstrate at least 4/5 MMT throughout tested groups in order to facilitate improved functional tolerance.  Baseline: see MMT chart above  Goal status: INITIAL  PLAN:  PT FREQUENCY: 2x/week  PT DURATION: 6 weeks  PLANNED INTERVENTIONS: 97164- PT Re-evaluation, 97750- Physical Performance Testing, 97110-Therapeutic exercises, 97530- Therapeutic activity, [id number]- Neuromuscular re-education, 97535- Self Care, [id number]- Manual therapy, [phone]- Gait training, [phone]- Aquatic Therapy, [phone]- Electrical stimulation (unattended), Patient/Family education, Balance training, Stair training, Taping, Dry Needling, Joint mobilization, Spinal mobilization, Cryotherapy, and Moist heat  PLAN FOR NEXT SESSION: Review/update HEP PRN. Work on [employer] exercises as appropriate with emphasis on grip/UE strength, LE strength, and balance/transfers. Symptom modification strategies as indicated/appropriate.    Lovett Ruck PT, DPT 03/17/2024 5:16 PM   Addendum to reflect communication from PCP office: Lovett Ruck PT, DPT 03/18/2024 1:53 PM     For all possible  CPT codes, reference the Planned Interventions line above.     Check all conditions that are expected to impact treatment: {Conditions expected to impact treatment:Musculoskeletal disorders

## 2024-03-17 ENCOUNTER — Encounter: Payer: Self-pay | Admitting: Physical Therapy

## 2024-03-17 ENCOUNTER — Ambulatory Visit: Admitting: Physical Therapy

## 2024-03-17 DIAGNOSIS — M25551 Pain in right hip: Secondary | ICD-10-CM

## 2024-03-17 DIAGNOSIS — M25521 Pain in right elbow: Secondary | ICD-10-CM

## 2024-03-17 DIAGNOSIS — M6281 Muscle weakness (generalized): Secondary | ICD-10-CM

## 2024-03-18 ENCOUNTER — Telehealth: Payer: Self-pay | Admitting: Physical Therapy

## 2024-03-18 NOTE — Telephone Encounter (Signed)
 Per communication w/ office, we received call back from PCP office advising to hold on PT until they are able to follow up with her on 5/27. Reached out to pt to discuss - she stated she has spoken with PCP office as well, has changed medications, and confirmed next weeks follow up. We cancelled appts on 5/22 and 5/27 per PCP communication and confirmed follow up with us  on 5/29. Pt denied any questions/concerns and verbalized understanding/agreement.

## 2024-03-19 ENCOUNTER — Ambulatory Visit

## 2024-03-24 ENCOUNTER — Ambulatory Visit

## 2024-03-26 ENCOUNTER — Ambulatory Visit

## 2024-03-26 NOTE — Therapy (Incomplete)
 OUTPATIENT PHYSICAL THERAPY TREATMENT   Patient Name: [name] MRN: [id number] DOB:1975-11-23, 48 y.o., female Today's Date: 03/26/2024  END OF SESSION:     Past Medical History:  Diagnosis Date   Anemia    Anxiety    Blood transfusion without reported diagnosis    Carpal tunnel syndrome, bilateral    Cataract    removed bilateral   Chronic hypertension    Depression    GERD (gastroesophageal reflux disease)    Gestational diabetes    H. pylori infection    dx  09-02-2014-- treated w/ antibiotic   Hypertension    recently low pressures    Hypothyroid    Irregular menstrual cycle    Menorrhagia    PCOS (polycystic ovarian syndrome)    Sleep apnea    Type 2 diabetes mellitus (HCC)    Past Surgical History:  Procedure Laterality Date   CARPAL TUNNEL RELEASE Bilateral 10/07/2014   Procedure: CARPAL TUNNEL RELEASE BILATERAL;  Surgeon: Shellie Dials, MD;  Location: [facility];  Service: Orthopedics;  Laterality: Bilateral;   CESAREAN SECTION  10/29/1993   CESAREAN SECTION WITH BILATERAL TUBAL LIGATION N/A 03/27/2019   Procedure: CESAREAN SECTION WITH BILATERAL TUBAL LIGATION;  Surgeon: Matt Song, MD;  Location: [facility];  Service: Obstetrics;  Laterality: N/A;   DILITATION & CURRETTAGE/HYSTROSCOPY WITH NOVASURE ABLATION N/A 11/08/2021   Procedure: DILATATION & CURETTAGE/HYSTEROSCOPY WITH NOVASURE ABLATION;  Surgeon: Matt Song, MD;  Location: [facility];  Service: Gynecology;  Laterality: N/A;   excessive skin removal     legs,stomach,arms   EYE SURGERY  09/13/2020   cataract bilateral   GASTRIC ROUX-EN-Y N/A 03/12/2017   Procedure: LAPAROSCOPIC ROUX-EN-Y GASTRIC BYPASS WITH UPPER ENDOSCOPY;  Surgeon: Dorrie Gaudier Alphonso Aschoff, MD;  Location: [facility];  Service: General;  Laterality: N/A;   HEMATOMA EVACUATION  12/09/2021   Procedure: EVACUATION HEMATOMA;  Surgeon: Alger Infield, MD;  Location: [facility];  Service:  Plastics;;   I & D EXTREMITY N/A 12/09/2021   Procedure: IRRIGATION AND DEBRIDEMENT ABDOMEN;  Surgeon: Alger Infield, MD;  Location: [facility];  Service: Plastics;  Laterality: N/A;   PANNICULECTOMY N/A 12/08/2021   Procedure: PANNICULECTOMY;  Surgeon: Alger Infield, MD;  Location: [facility];  Service: Plastics;  Laterality: N/A;   TUBAL LIGATION  03/27/2019   UPPER GI ENDOSCOPY  03/12/2017   Procedure: UPPER GI ENDOSCOPY;  Surgeon: Dorrie Gaudier Alphonso Aschoff, MD;  Location: [facility];  Service: General;;   Patient Active Problem List   Diagnosis Date Noted   S/P panniculectomy 12/09/2021   Iron  deficiency anemia 06/26/2021   Vaginal bleeding in pregnancy, third trimester 03/11/2019   GBS bacteriuria 01/22/2019   Pregnancy of unknown anatomic location 08/10/2018   Intertrigo 11/22/2017   S/P gastric bypass 04/04/2017   Morbid obesity (HCC) 03/12/2017   Hypothyroidism 09/11/2016   Allergic rhinitis due to pollen 06/29/2015   Anxiety and depression 06/29/2015   Gastroesophageal reflux disease without esophagitis 06/29/2015   OSA on CPAP 06/29/2015   History of syphilis 05/23/2015   Irregular menstrual cycle 11/12/2013   POLYCYSTIC OVARIAN DISEASE 06/20/2009    PCP: Valere Gata, MD  REFERRING PROVIDER: Wes Hamman, MD  REFERRING DIAG: [phone] (ICD-10-CM) - Pain in right hip M77.11 (ICD-10-CM) - Lateral epicondylitis, right elbow  THERAPY DIAG:  No diagnosis found.  Rationale for Evaluation and Treatment: Rehabilitation  ONSET DATE: ~ 8 years ago; gradual onset  SUBJECTIVE:  SUBJECTIVE STATEMENT: 03/26/2024 ***   Pt states she got injection last week and resolved hip pain until this morning, but remains improved (about 2/10). Does endorse a headache/HTN last night that resolved  with taking her BP medication.   Per eval - Pt states symptoms began around 8 years ago - had gastric bypass around 2018, she feels like symptoms began around same time she started trying to lose weight. Reports issues with carpal tunnel and UE numbness since around 2000. Pt states symptoms are worse in the mornings, and with work activities. She reports L sided symptoms as well but more so on R. Stopped working about a month ago, states symptoms have improved some since. R lateral thigh numbness at times, less so on L side, feels this has also improved since she stopped working. Reports cramping in feet and shins at times. Pt does report hx of known incontinence, has received pelvic PT and is in communication with providers. Has surgery scheduled for June 25th per pt. Pt describes her incontinence as chronic/stable. Hand dominance: Right  PERTINENT HISTORY: anemia, anxiety, HTN, depression, GERD, PCOS, DM2, hx gastric bypass  PAIN:  Are you having pain: 2/10  Per eval -  Location/description: R lateral elbow to wrist; R hip and posterior thigh, throbbing Best-worst over past week: 3-7/10  - aggravating factors: standing, walking, picking up child, transfers - Easing factors: medication, massage  PRECAUTIONS: None  WEIGHT BEARING RESTRICTIONS: No  FALLS:  Has patient fallen in last 6 months? No but does endorse near falls due to jolts of hip pain  LIVING ENVIRONMENT: Lives with husband and 22 year old. 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  DIAGNOSTIC FINDINGS:  02/25/24 hip XR: "X-rays of the pelvis and right hip show mild to  moderate osteoarthritis  with decreased joint space. "  PATIENT SURVEYS:  LEFS 41/80; QuickDASH deferred given time constraints  COGNITION: Overall cognitive status: Within functional limits for tasks assessed     SENSATION: Reports diminished sensation BIL lateral thighs and R lateral shoulder, otherwise LT intact BIL UE/LE Negative hoffman or tromner signs  No clonus either LE  POSTURE: Rounded shoulders, forward head  UPPER EXTREMITY ROM:   ROM Right eval Left eval  Shoulder flexion    Shoulder extension    Shoulder abduction    Shoulder adduction    Shoulder internal rotation    Shoulder external rotation    Elbow flexion full   Elbow extension Full with mild pain   Wrist flexion    Wrist extension    Wrist ulnar deviation    Wrist radial deviation    Wrist pronation 70   Wrist supination 80   (Blank rows = not tested)   LOWER EXTREMITY ROM:      Right eval Left eval  Hip flexion    Hip extension    Hip internal rotation    Hip external rotation    Knee extension    Knee flexion    (Blank rows = not tested) (Key: WFL = within functional limits not formally assessed, * = concordant pain, s = stiffness/stretching sensation, NT = not tested)  Comments:  UPPER EXTREMITY MMT:  MMT Right eval Left eval  Elbow flexion    Elbow extension    Wrist flexion    Wrist extension    Wrist ulnar deviation    Wrist radial deviation    Wrist pronation    Wrist supination    Grip strength (lbs) 20# 10#  (Blank rows = not tested)  LOWER EXTREMITY MMT:    MMT Right eval Left eval  Hip flexion 3+ * 4-  Hip abduction (modified sitting) 4 4  Hip internal rotation    Hip external rotation    Knee flexion 4- * 4  Knee extension 4-* 4  Ankle dorsiflexion     (Blank rows = not tested) (Key: WFL = within functional limits not formally assessed, * = concordant pain, s = stiffness/stretching sensation, NT = not tested)  Comments:    SHOULDER SPECIAL  TESTS: Deferred given time constraints  JOINT MOBILITY TESTING:  Deferred given time constraints  FUNCTIONAL TEST: 5xSTS 26 sec with LE pain, UE support Gait mechanics w/ increased lateral weight shifting, reduced stance time on RLE, reduced gait speed/cadence                                                                                                                             TREATMENT DATE:   [facility] Adult PT Treatment:                                                DATE: 03/26/2024  *** Check Vitals FIRST   Therapeutic Exercise: *** Manual Therapy: *** Neuromuscular re-ed: *** Therapeutic Activity: *** Modalities: *** Self Care: ***   [facility] Adult PT Treatment:                                                DATE: 03/17/24 Vitals: BP LUE 184/105 asymptomatic; after 4 min rest ~ 190/108  Self Care: Monitoring vitals, education/discussion re: vitals as they pertain to PT, red flags and appropriate action should they occur, communication w/ provider; see assessment below   [facility] Adult PT Treatment:                                                DATE: 03/04/24 Therapeutic Exercise: Towel gripping, STS practice reps w/ education on appropriate setup/performance, handout provided  Self Care: Education on exam findings as they relate to symptom behavior, rationale for interventions, relevant anatomy/physiology, and strategies to monitor/adjust HEP for improved tolerance as needed    PATIENT EDUCATION: Education details: rationale for interventions, HEP  Person  educated: Patient Education method: Explanation, Demonstration, Tactile cues, Verbal cues Education comprehension: verbalized understanding, returned demonstration, verbal cues required, tactile cues required, and needs further education      HOME EXERCISE PROGRAM: Access Code: [email or web address] URL: [email or web address] Date: 03/04/2024 Prepared by: Mayme Spearman  Exercises - Seated Gripping Towel  - 2-3  x daily - 1 sets - 8 reps - Sit to Stand with Armchair  - 2-3 x daily - 1 sets - 5 reps  ASSESSMENT:  CLINICAL IMPRESSION: 03/26/2024 Pt arrived and reported improved hip pain. She did endorse HTN last night with systolic reportedly >271mmHg, accompanied by headache. This resolved, but we did check BP at beginning of session with readings as above. We discussed BP and pt stated at best she will usually run 150s systolic, she stated today's readings were typical for her and she is in communication with her providers about it. Discussed vitals as they pertain to PT, she provided verbal consent to reach out to her PCP (Dr. Rocky Cipro with [facility]) to discuss steps forward with PT. Given vitals outside of appropriate parameters we deferred further treatment today. She denied any symptoms during session; education was provided on red flags and appropriate action should they occur. Pt departed today's session in no acute distress, all voiced questions/concerns addressed appropriately from PT perspective.   Did reach out to Dr. Shirlie Dove office after pt departure, was able to speak with office who stated they will have provider call back as able, appreciate their assistance.   03/18/24 addendum: Received call back this morning from [place] at [facility], discussed BP readings, she states Dr. Felipe Horton will reach out to patient to discuss and get back in touch with us  via office phone or fax for guidance going forward. Appreciate their assistance.   Per eval - Patient is a 49 y.o. woman who was seen today for physical therapy evaluation and treatment for R hip and R elbow pain. Pt endorses chronic issues worsening to the point where she is no longer working, although she endorses improvement in symptoms in last month without working. She endorses difficulty w/ lifting, standing, and walking. On exam she demonstrates concordant limitations in grip strength, LE strength, and altered kinematics w/ gait/transfers.  Tolerates exam/HEP well without adverse event or increase in resting pain. Recommend trial of skilled PT to address aforementioned deficits with aim of improving functional tolerance and reducing pain with typical activities. Pt departs today's session in no acute distress, all voiced concerns/questions addressed appropriately from PT perspective.    OBJECTIVE IMPAIRMENTS: Abnormal gait, decreased activity tolerance, decreased balance, decreased endurance, decreased mobility, difficulty walking, decreased ROM, decreased strength, impaired perceived functional ability, impaired sensation, impaired UE functional use, improper body mechanics, postural dysfunction, and pain.   ACTIVITY LIMITATIONS: carrying, lifting, bending, standing, squatting, sleeping, stairs, transfers, and locomotion level  PARTICIPATION LIMITATIONS: meal prep, cleaning, laundry, community activity, and occupation  PERSONAL FACTORS: Time since onset of injury/illness/exacerbation and 3+ comorbidities: anemia, anxiety, HTN, depression, GERD, PCOS, DM2, hx gastric bypass are also affecting patient's functional outcome.   REHAB POTENTIAL: Fair given chronicity and comorbidities  CLINICAL DECISION MAKING: Evolving/moderate complexity  EVALUATION COMPLEXITY: Moderate   GOALS:   SHORT TERM GOALS: Target date: 03/25/2024  Pt will demonstrate appropriate understanding and performance of initially prescribed HEP in order to facilitate improved independence with management of symptoms.  Baseline: HEP established   Goal status: INITIAL   2. Pt will report at least 25% improvement in overall pain levels over past  week in order to facilitate improved tolerance to typical daily activities.   Baseline: 3-7/10  Goal status: INITIAL    LONG TERM GOALS: Target date: 04/15/2024  Pt will score 55/80 or greater on LEFS in order to demonstrate improved perception of function due to symptoms (MCID 9 pts) Baseline: 41/80 Goal status:  INITIAL  2.  Pt will demonstrate at least 50% improvement in BIL grip strength to facilitate improved functional tolerance.  Baseline: see MMT chart above Goal status: INITIAL  3.  Pt will report at least 50% decrease in overall pain levels in past week in order to facilitate improved tolerance to basic ADLs/mobility.   Baseline: 3-7/10  Goal status: INITIAL    4.  Pt will be able to perform 5xSTS in less than or equal to 16sec in order to demonstrate reduced fall risk and improved functional independence ([facility] 5xSTS = 2.3 sec). Baseline: 26 sec, UE support and pain Goal status: INITIAL   5. Pt will demonstrate at least 4/5 MMT throughout tested groups in order to facilitate improved functional tolerance.  Baseline: see MMT chart above  Goal status: INITIAL  PLAN:  PT FREQUENCY: 2x/week  PT DURATION: 6 weeks  PLANNED INTERVENTIONS: 97164- PT Re-evaluation, 97750- Physical Performance Testing, 97110-Therapeutic exercises, 97530- Therapeutic activity, [id number]- Neuromuscular re-education, 97535- Self Care, 98119- Manual therapy, [phone]- Gait training, [phone]- Aquatic Therapy, [phone]- Electrical stimulation (unattended), Patient/Family education, Balance training, Stair training, Taping, Dry Needling, Joint mobilization, Spinal mobilization, Cryotherapy, and Moist heat  PLAN FOR NEXT SESSION: Review/update HEP PRN. Work on [employer] exercises as appropriate with emphasis on grip/UE strength, LE strength, and balance/transfers. Symptom modification strategies as indicated/appropriate.    Arlester Bence, PT, DPT  03/26/2024 8:35 AM     For all possible CPT codes, reference the Planned Interventions line above.     Check all conditions that are expected to impact treatment: {Conditions expected to impact treatment:Musculoskeletal disorders

## 2024-03-30 ENCOUNTER — Ambulatory Visit: Payer: Self-pay | Attending: Family Medicine | Admitting: Physical Therapy

## 2024-03-30 ENCOUNTER — Encounter: Payer: Self-pay | Admitting: Physical Therapy

## 2024-03-30 DIAGNOSIS — M25521 Pain in right elbow: Secondary | ICD-10-CM | POA: Insufficient documentation

## 2024-03-30 DIAGNOSIS — M6281 Muscle weakness (generalized): Secondary | ICD-10-CM | POA: Diagnosis present

## 2024-03-30 DIAGNOSIS — M25551 Pain in right hip: Secondary | ICD-10-CM | POA: Diagnosis present

## 2024-03-30 NOTE — Therapy (Signed)
 OUTPATIENT PHYSICAL THERAPY TREATMENT   Patient Name: [name] MRN: [id number] DOB:1976-01-11, 48 y.o., female Today's Date: 03/30/2024  END OF SESSION:  PT End of Session - 03/30/24 1215     Visit Number 3    Number of Visits 13    Date for PT Re-Evaluation 04/15/24    Authorization Type [employer]    Authorization Time Period 7 visits 03/04/24-05/02/24    Authorization - Visit Number 2    Authorization - Number of Visits 7    PT Start Time 1215    PT Stop Time 1240    PT Time Calculation (min) 25 min               Past Medical History:  Diagnosis Date   Anemia    Anxiety    Blood transfusion without reported diagnosis    Carpal tunnel syndrome, bilateral    Cataract    removed bilateral   Chronic hypertension    Depression    GERD (gastroesophageal reflux disease)    Gestational diabetes    H. pylori infection    dx  09-02-2014-- treated w/ antibiotic   Hypertension    recently low pressures    Hypothyroid    Irregular menstrual cycle    Menorrhagia    PCOS (polycystic ovarian syndrome)    Sleep apnea    Type 2 diabetes mellitus (HCC)    Past Surgical History:  Procedure Laterality Date   CARPAL TUNNEL RELEASE Bilateral 10/07/2014   Procedure: CARPAL TUNNEL RELEASE BILATERAL;  Surgeon: Shellie Dials, MD;  Location: [facility] [place];  Service: Orthopedics;  Laterality: Bilateral;   CESAREAN SECTION  10/29/1993   CESAREAN SECTION WITH BILATERAL TUBAL LIGATION N/A 03/27/2019   Procedure: CESAREAN SECTION WITH BILATERAL TUBAL LIGATION;  Surgeon: Matt Song, MD;  Location: [facility];  Service: Obstetrics;  Laterality: N/A;   DILITATION & CURRETTAGE/HYSTROSCOPY WITH NOVASURE ABLATION N/A 11/08/2021   Procedure: DILATATION & CURETTAGE/HYSTEROSCOPY WITH NOVASURE ABLATION;  Surgeon: Matt Song, MD;  Location: [facility] [place];  Service: Gynecology;  Laterality: N/A;   excessive skin removal      legs,stomach,arms   EYE SURGERY  09/13/2020   cataract bilateral   GASTRIC ROUX-EN-Y N/A 03/12/2017   Procedure: LAPAROSCOPIC ROUX-EN-Y GASTRIC BYPASS WITH UPPER ENDOSCOPY;  Surgeon: Dorrie Gaudier Alphonso Aschoff, MD;  Location: [facility];  Service: General;  Laterality: N/A;   HEMATOMA EVACUATION  12/09/2021   Procedure: EVACUATION HEMATOMA;  Surgeon: Alger Infield, MD;  Location: [facility];  Service: Plastics;;   I & D EXTREMITY N/A 12/09/2021   Procedure: IRRIGATION AND DEBRIDEMENT ABDOMEN;  Surgeon: Alger Infield, MD;  Location: [facility];  Service: Plastics;  Laterality: N/A;   PANNICULECTOMY N/A 12/08/2021   Procedure: PANNICULECTOMY;  Surgeon: Alger Infield, MD;  Location: [facility];  Service: Plastics;  Laterality: N/A;   TUBAL LIGATION  03/27/2019   UPPER GI ENDOSCOPY  03/12/2017   Procedure: UPPER GI ENDOSCOPY;  Surgeon: Dorrie Gaudier Alphonso Aschoff, MD;  Location: [facility];  Service: General;;   Patient Active Problem List   Diagnosis Date Noted   S/P panniculectomy 12/09/2021   Iron  deficiency anemia 06/26/2021   Vaginal bleeding in pregnancy, third trimester 03/11/2019   GBS bacteriuria 01/22/2019   Pregnancy of unknown anatomic location 08/10/2018   Intertrigo 11/22/2017   S/P gastric bypass 04/04/2017   Morbid obesity (HCC) 03/12/2017   Hypothyroidism 09/11/2016   Allergic rhinitis due to pollen 06/29/2015   Anxiety and depression  06/29/2015   Gastroesophageal reflux disease without esophagitis 06/29/2015   OSA on CPAP 06/29/2015   History of syphilis 05/23/2015   Irregular menstrual cycle 11/12/2013   POLYCYSTIC OVARIAN DISEASE 06/20/2009    PCP: Valere Gata, MD  REFERRING PROVIDER: Wes Hamman, MD  REFERRING DIAG: [phone] (ICD-10-CM) - Pain in right hip M77.11 (ICD-10-CM) - Lateral epicondylitis, right elbow  THERAPY DIAG:  Pain in right hip  Pain in right elbow  Muscle weakness (generalized)  Rationale for Evaluation and Treatment:  Rehabilitation  ONSET DATE: ~ 8 years ago; gradual onset  SUBJECTIVE:                                                                                                                                                                                      SUBJECTIVE STATEMENT: 03/30/2024 Pt states she is following up with PCP again next week (6/10), states she was cleared to resume PT and BP has been better although remains hypertensive. R elbow still feeling irritated with gripping and opening objects. States her hip and back bothers her in mornings.    Per eval - Pt states symptoms began around 8 years ago - had gastric bypass around 2018, she feels like symptoms began around same time she started trying to lose weight. Reports issues with carpal tunnel and UE numbness since around 2000. Pt states symptoms are worse in the mornings, and with work activities. She reports L sided symptoms as well but more so on R. Stopped working about a month ago, states symptoms have improved some since. R lateral thigh numbness at times, less so on L side, feels this has also improved since she stopped working. Reports cramping in feet and shins at times. Pt does report hx of known incontinence, has received pelvic PT and is in communication with providers. Has surgery scheduled for June 25th per pt. Pt describes her incontinence as chronic/stable. Hand dominance: Right  PERTINENT HISTORY: anemia, anxiety, HTN, depression, GERD, PCOS, DM2, hx gastric bypass  PAIN:  Are you having pain: 1-2/10 R elbow; no hip/back pain  Per eval -  Location/description: R lateral elbow to wrist; R hip and posterior thigh, throbbing Best-worst over past week: 3-7/10  - aggravating factors: standing, walking, picking up child, transfers - Easing factors: medication, massage  PRECAUTIONS: None  WEIGHT BEARING RESTRICTIONS: No  FALLS:  Has patient fallen in last 6 months? No but does endorse near falls due to jolts of hip  pain  LIVING ENVIRONMENT: Lives with husband and 86 year old. Pt reportedly does majority of housework  OCCUPATION: Pt states she had to quit job due to pain - states she was "  doing heavy stuff", used to work for [employer], inventory  PLOF: Independent  PATIENT GOALS: get more strength, be able to keep up with kid  NEXT MD VISIT: 03/09/24 injection  OBJECTIVE:  Note: Objective measures were completed at Evaluation unless otherwise noted.  DIAGNOSTIC FINDINGS:  02/25/24 hip XR: "X-rays of the pelvis and right hip show mild to moderate osteoarthritis  with decreased joint space. "  PATIENT SURVEYS:  LEFS 41/80; QuickDASH deferred given time constraints  COGNITION: Overall cognitive status: Within functional limits for tasks assessed     SENSATION: Reports diminished sensation BIL lateral thighs and R lateral shoulder, otherwise LT intact BIL UE/LE Negative hoffman or tromner signs  No clonus either LE  POSTURE: Rounded shoulders, forward head  UPPER EXTREMITY ROM:   ROM Right eval Left eval  Shoulder flexion    Shoulder extension    Shoulder abduction    Shoulder adduction    Shoulder internal rotation    Shoulder external rotation    Elbow flexion full   Elbow extension Full with mild pain   Wrist flexion    Wrist extension    Wrist ulnar deviation    Wrist radial deviation    Wrist pronation 70   Wrist supination 80   (Blank rows = not tested)   LOWER EXTREMITY ROM:      Right eval Left eval  Hip flexion    Hip extension    Hip internal rotation    Hip external rotation    Knee extension    Knee flexion    (Blank rows = not tested) (Key: WFL = within functional limits not formally assessed, * = concordant pain, s = stiffness/stretching sensation, NT = not tested)  Comments:    UPPER EXTREMITY MMT:  MMT Right eval Left eval  Elbow flexion    Elbow extension    Wrist flexion    Wrist extension    Wrist ulnar deviation    Wrist  radial deviation    Wrist pronation    Wrist supination    Grip strength (lbs) 20# 10#  (Blank rows = not tested)  LOWER EXTREMITY MMT:    MMT Right eval Left eval  Hip flexion 3+ * 4-  Hip abduction (modified sitting) 4 4  Hip internal rotation    Hip external rotation    Knee flexion 4- * 4  Knee extension 4-* 4  Ankle dorsiflexion     (Blank rows = not tested) (Key: WFL = within functional limits not formally assessed, * = concordant pain, s = stiffness/stretching sensation, NT = not tested)  Comments:    SHOULDER SPECIAL TESTS: Deferred given time constraints  JOINT MOBILITY TESTING:  Deferred given time constraints  FUNCTIONAL TEST: 5xSTS 26 sec with LE pain, UE support Gait mechanics w/ increased lateral weight shifting, reduced stance time on RLE, reduced gait speed/cadence  TREATMENT DATE:  [facility] Adult PT Treatment:                                                DATE: 03/30/24 Vitals: BP LUE 173/102; after a couple minutes rest BP 186/107; pt asymptomatic  Therapeutic Exercise: HEP discussion/education with addition of towel twist, handout provided, education on appropriate setup/performance at home  Self Care: Monitoring vitals, education re: vitals as they pertain to activity/pain, continuing communication w/ providers, rationale for deferring further treatment today; use of HEP as indicated and trying to perform at less busy/stressful times of day, continued monitoring of BP, stress as it relates to pain and vitals   [facility] Adult PT Treatment:                                                DATE: 03/17/24 Vitals: BP LUE 184/105 asymptomatic; after 4 min rest ~ 190/108  Self Care: Monitoring vitals, education/discussion re: vitals as they pertain to PT, red flags and appropriate action should they occur, communication w/ provider; see assessment  below   [facility] Adult PT Treatment:                                                DATE: 03/04/24 Therapeutic Exercise: Towel gripping, STS practice reps w/ education on appropriate setup/performance, handout provided  Self Care: Education on exam findings as they relate to symptom behavior, rationale for interventions, relevant anatomy/physiology, and strategies to monitor/adjust HEP for improved tolerance as needed    PATIENT EDUCATION: Education details: rationale for interventions, HEP  Person educated: Patient Education method: Explanation, Demonstration, Tactile cues, Verbal cues Education comprehension: verbalized understanding, returned demonstration, verbal cues required, tactile cues required, and needs further education      HOME EXERCISE PROGRAM: Access Code: [email or web address] URL: [email or web address] Date: 03/30/2024 Prepared by: Mayme Spearman  Exercises - Seated Gripping Towel  - 2-3 x daily - 1 sets - 8 reps - Sit to Stand with Armchair  - 2-3 x daily - 1 sets - 5 reps - Seated Wrist Flexion and Extension with Towel Twist  - 2-3 x daily - 1 sets - 8-10 reps  ASSESSMENT:  CLINICAL IMPRESSION: 03/30/2024 Pt arrives w/ report of elbow>hip pain, improved BP with medication change and follow up with PCP. Despite improvement in BP overall, it remains outside of appropriate limits for PT today which limits our ability to perform treatment. Continued education as above. Given report of generally improved BP however, we do prescribe additional elbow exercise for HEP to maximize progress and time spent educating on appropriate performance/modification as indicated. No adverse events, pt verbalizes agreement/understanding w/ current plan. Pt departs today's session in no acute distress, all voiced questions/concerns addressed appropriately from PT perspective.     Per eval - Patient is a 48 y.o. woman who was seen today for physical therapy evaluation and treatment for R hip and  R elbow pain. Pt endorses chronic issues worsening to the point where she is no longer working, although she endorses improvement in symptoms in last month without working. She endorses difficulty w/  lifting, standing, and walking. On exam she demonstrates concordant limitations in grip strength, LE strength, and altered kinematics w/ gait/transfers. Tolerates exam/HEP well without adverse event or increase in resting pain. Recommend trial of skilled PT to address aforementioned deficits with aim of improving functional tolerance and reducing pain with typical activities. Pt departs today's session in no acute distress, all voiced concerns/questions addressed appropriately from PT perspective.    OBJECTIVE IMPAIRMENTS: Abnormal gait, decreased activity tolerance, decreased balance, decreased endurance, decreased mobility, difficulty walking, decreased ROM, decreased strength, impaired perceived functional ability, impaired sensation, impaired UE functional use, improper body mechanics, postural dysfunction, and pain.   ACTIVITY LIMITATIONS: carrying, lifting, bending, standing, squatting, sleeping, stairs, transfers, and locomotion level  PARTICIPATION LIMITATIONS: meal prep, cleaning, laundry, community activity, and occupation  PERSONAL FACTORS: Time since onset of injury/illness/exacerbation and 3+ comorbidities: anemia, anxiety, HTN, depression, GERD, PCOS, DM2, hx gastric bypass are also affecting patient's functional outcome.   REHAB POTENTIAL: Fair given chronicity and comorbidities  CLINICAL DECISION MAKING: Evolving/moderate complexity  EVALUATION COMPLEXITY: Moderate   GOALS:   SHORT TERM GOALS: Target date: 03/25/2024  Pt will demonstrate appropriate understanding and performance of initially prescribed HEP in order to facilitate improved independence with management of symptoms.  Baseline: HEP established   03/30/24: deferred given first follow up visit Goal status: ONGOING  2. Pt  will report at least 25% improvement in overall pain levels over past week in order to facilitate improved tolerance to typical daily activities.   Baseline: 3-7/10 03/30/24: deferred given first follow up visit Goal status: ONGOING  LONG TERM GOALS: Target date: 04/15/2024  Pt will score 55/80 or greater on LEFS in order to demonstrate improved perception of function due to symptoms ([facility] 9 pts) Baseline: 41/80 Goal status: INITIAL  2.  Pt will demonstrate at least 50% improvement in BIL grip strength to facilitate improved functional tolerance.  Baseline: see MMT chart above Goal status: INITIAL  3.  Pt will report at least 50% decrease in overall pain levels in past week in order to facilitate improved tolerance to basic ADLs/mobility.   Baseline: 3-7/10  Goal status: INITIAL    4.  Pt will be able to perform 5xSTS in less than or equal to 16sec in order to demonstrate reduced fall risk and improved functional independence ([facility] 5xSTS = 2.3 sec). Baseline: 26 sec, UE support and pain Goal status: INITIAL   5. Pt will demonstrate at least 4/5 MMT throughout tested groups in order to facilitate improved functional tolerance.  Baseline: see MMT chart above  Goal status: INITIAL  PLAN:  PT FREQUENCY: 2x/week  PT DURATION: 6 weeks  PLANNED INTERVENTIONS: 97164- PT Re-evaluation, 97750- Physical Performance Testing, 97110-Therapeutic exercises, 97530- Therapeutic activity, [id number]- Neuromuscular re-education, 97535- Self Care, 16109- Manual therapy, [phone]- Gait training, [phone]- Aquatic Therapy, [phone]- Electrical stimulation (unattended), Patient/Family education, Balance training, Stair training, Taping, Dry Needling, Joint mobilization, Spinal mobilization, Cryotherapy, and Moist heat  PLAN FOR NEXT SESSION: Review/update HEP PRN. Work on [employer] exercises as appropriate with emphasis on grip/UE strength, LE strength, and balance/transfers. Symptom modification strategies as  indicated/appropriate. Monitor vitals to ensure BP in appropriate levels.    Lovett Ruck PT, DPT 03/30/2024 12:47 PM     For all possible CPT codes, reference the Planned Interventions line above.     Check all conditions that are expected to impact treatment: {Conditions expected to impact treatment:Musculoskeletal disorders

## 2024-03-31 ENCOUNTER — Encounter

## 2024-04-02 ENCOUNTER — Ambulatory Visit: Admitting: Physical Therapy

## 2024-04-02 ENCOUNTER — Telehealth: Payer: Self-pay | Admitting: Physical Therapy

## 2024-04-02 NOTE — Therapy (Incomplete)
 OUTPATIENT PHYSICAL THERAPY TREATMENT   Patient Name: [name] MRN: [id number] DOB:[birth year], 48 y.o., female Today's Date: 04/02/2024  END OF SESSION:      Past Medical History:  Diagnosis Date   Anemia    Anxiety    Blood transfusion without reported diagnosis    Carpal tunnel syndrome, bilateral    Cataract    removed bilateral   Chronic hypertension    Depression    GERD (gastroesophageal reflux disease)    Gestational diabetes    H. pylori infection    dx  09-02-2014-- treated w/ antibiotic   Hypertension    recently low pressures    Hypothyroid    Irregular menstrual cycle    Menorrhagia    PCOS (polycystic ovarian syndrome)    Sleep apnea    Type 2 diabetes mellitus (HCC)    Past Surgical History:  Procedure Laterality Date   CARPAL TUNNEL RELEASE Bilateral 10/07/2014   Procedure: CARPAL TUNNEL RELEASE BILATERAL;  Surgeon: Shellie Dials, MD;  Location: [facility] [place];  Service: Orthopedics;  Laterality: Bilateral;   CESAREAN SECTION  10/29/1993   CESAREAN SECTION WITH BILATERAL TUBAL LIGATION N/A 03/27/2019   Procedure: CESAREAN SECTION WITH BILATERAL TUBAL LIGATION;  Surgeon: Matt Song, MD;  Location: [facility];  Service: Obstetrics;  Laterality: N/A;   DILITATION & CURRETTAGE/HYSTROSCOPY WITH NOVASURE ABLATION N/A 11/08/2021   Procedure: DILATATION & CURETTAGE/HYSTEROSCOPY WITH NOVASURE ABLATION;  Surgeon: Matt Song, MD;  Location: [facility] [place];  Service: Gynecology;  Laterality: N/A;   excessive skin removal     legs,stomach,arms   EYE SURGERY  09/13/2020   cataract bilateral   GASTRIC ROUX-EN-Y N/A 03/12/2017   Procedure: LAPAROSCOPIC ROUX-EN-Y GASTRIC BYPASS WITH UPPER ENDOSCOPY;  Surgeon: Dorrie Gaudier Alphonso Aschoff, MD;  Location: [facility];  Service: General;  Laterality: N/A;   HEMATOMA EVACUATION  12/09/2021   Procedure: EVACUATION HEMATOMA;  Surgeon: Alger Infield, MD;  Location: [facility];  Service:  Plastics;;   I & D EXTREMITY N/A 12/09/2021   Procedure: IRRIGATION AND DEBRIDEMENT ABDOMEN;  Surgeon: Alger Infield, MD;  Location: [facility];  Service: Plastics;  Laterality: N/A;   PANNICULECTOMY N/A 12/08/2021   Procedure: PANNICULECTOMY;  Surgeon: Alger Infield, MD;  Location: [facility];  Service: Plastics;  Laterality: N/A;   TUBAL LIGATION  03/27/2019   UPPER GI ENDOSCOPY  03/12/2017   Procedure: UPPER GI ENDOSCOPY;  Surgeon: Dorrie Gaudier Alphonso Aschoff, MD;  Location: [facility];  Service: General;;   Patient Active Problem List   Diagnosis Date Noted   S/P panniculectomy 12/09/2021   Iron  deficiency anemia 06/26/2021   Vaginal bleeding in pregnancy, third trimester 03/11/2019   GBS bacteriuria 01/22/2019   Pregnancy of unknown anatomic location 08/10/2018   Intertrigo 11/22/2017   S/P gastric bypass 04/04/2017   Morbid obesity (HCC) 03/12/2017   Hypothyroidism 09/11/2016   Allergic rhinitis due to pollen 06/29/2015   Anxiety and depression 06/29/2015   Gastroesophageal reflux disease without esophagitis 06/29/2015   OSA on CPAP 06/29/2015   History of syphilis 05/23/2015   Irregular menstrual cycle 11/12/2013   POLYCYSTIC OVARIAN DISEASE 06/20/2009    PCP: Valere Gata, MD  REFERRING PROVIDER: Wes Hamman, MD  REFERRING DIAG: [phone] (ICD-10-CM) - Pain in right hip M77.11 (ICD-10-CM) - Lateral epicondylitis, right elbow  THERAPY DIAG:  No diagnosis found.  Rationale for Evaluation and Treatment: Rehabilitation  ONSET DATE: ~ 8 years ago; gradual onset  SUBJECTIVE:  SUBJECTIVE STATEMENT: 04/02/2024 ***  *** Pt states she is following up with PCP again next week (6/10), states she was cleared to resume PT and BP has been better although remains hypertensive. R elbow still  feeling irritated with gripping and opening objects. States her hip and back bothers her in mornings.    Per eval - Pt states symptoms began around 8 years ago - had gastric bypass around 2018, she feels like symptoms began around same time she started trying to lose weight. Reports issues with carpal tunnel and UE numbness since around 2000. Pt states symptoms are worse in the mornings, and with work activities. She reports L sided symptoms as well but more so on R. Stopped working about a month ago, states symptoms have improved some since. R lateral thigh numbness at times, less so on L side, feels this has also improved since she stopped working. Reports cramping in feet and shins at times. Pt does report hx of known incontinence, has received pelvic PT and is in communication with providers. Has surgery scheduled for June 25th per pt. Pt describes her incontinence as chronic/stable. Hand dominance: Right  PERTINENT HISTORY: anemia, anxiety, HTN, depression, GERD, PCOS, DM2, hx gastric bypass  PAIN:  Are you having pain: 1-2/10 R elbow; no hip/back pain ***   Per eval -  Location/description: R lateral elbow to wrist; R hip and posterior thigh, throbbing Best-worst over past week: 3-7/10  - aggravating factors: standing, walking, picking up child, transfers - Easing factors: medication, massage  PRECAUTIONS: None  WEIGHT BEARING RESTRICTIONS: No  FALLS:  Has patient fallen in last 6 months? No but does endorse near falls due to jolts of hip pain  LIVING ENVIRONMENT: Lives with husband and 88 year old. 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  DIAGNOSTIC FINDINGS:  02/25/24 hip XR: "X-rays of the pelvis and right hip show mild to moderate osteoarthritis  with decreased joint space. "  PATIENT SURVEYS:  LEFS 41/80; QuickDASH deferred given time constraints  COGNITION: Overall cognitive status: Within functional limits for tasks assessed     SENSATION: Reports diminished sensation BIL lateral thighs and R lateral shoulder, otherwise LT intact BIL UE/LE Negative hoffman or tromner signs  No clonus either LE  POSTURE: Rounded shoulders, forward head  UPPER EXTREMITY ROM:   ROM Right eval Left eval  Shoulder flexion    Shoulder extension    Shoulder abduction    Shoulder adduction    Shoulder internal rotation    Shoulder external rotation    Elbow flexion full   Elbow extension Full with mild pain   Wrist flexion    Wrist extension    Wrist ulnar deviation    Wrist radial deviation    Wrist pronation 70   Wrist supination 80   (Blank rows = not tested)   LOWER EXTREMITY ROM:      Right eval Left eval  Hip flexion    Hip extension    Hip internal rotation    Hip external rotation    Knee extension    Knee flexion    (Blank rows = not tested) (Key: WFL =  within functional limits not formally assessed, * = concordant pain, s = stiffness/stretching sensation, NT = not tested)  Comments:    UPPER EXTREMITY MMT:  MMT Right eval Left eval  Elbow flexion    Elbow extension    Wrist flexion    Wrist extension    Wrist ulnar deviation    Wrist radial deviation    Wrist pronation    Wrist supination    Grip strength (lbs) 20# 10#  (Blank rows = not tested)  LOWER EXTREMITY MMT:    MMT Right eval Left eval  Hip flexion 3+ * 4-  Hip abduction (modified sitting) 4 4  Hip internal rotation    Hip external rotation    Knee flexion 4- * 4  Knee extension 4-* 4  Ankle dorsiflexion     (Blank rows = not tested) (Key: WFL = within functional limits not formally assessed, * =  concordant pain, s = stiffness/stretching sensation, NT = not tested)  Comments:    SHOULDER SPECIAL TESTS: Deferred given time constraints  JOINT MOBILITY TESTING:  Deferred given time constraints  FUNCTIONAL TEST: 5xSTS 26 sec with LE pain, UE support Gait mechanics w/ increased lateral weight shifting, reduced stance time on RLE, reduced gait speed/cadence                                                                                                                             TREATMENT DATE:  [facility] Adult PT Treatment:                                                DATE: 04/02/24 Vitals: BP LUE ***  Therapeutic Exercise: *** Manual Therapy: *** Neuromuscular re-ed: *** Therapeutic Activity: *** Modalities: *** Self Care: ***    [facility] Adult PT Treatment:                                                DATE: 03/30/24 Vitals: BP LUE 173/102; after a couple minutes rest BP 186/107; pt asymptomatic  Therapeutic Exercise: HEP discussion/education with addition of towel twist, handout provided, education on appropriate setup/performance at home  Self Care: Monitoring vitals, education re: vitals as they pertain to activity/pain, continuing communication w/ providers, rationale for deferring further treatment today; use of HEP as indicated and trying to perform at less busy/stressful times of day, continued monitoring of BP, stress as it relates to pain and vitals   [facility] Adult PT Treatment:  DATE: 03/17/24 Vitals: BP LUE 184/105 asymptomatic; after 4 min rest ~ 190/108  Self Care: Monitoring vitals, education/discussion re: vitals as they pertain to PT, red flags and appropriate action should they occur, communication w/ provider; see assessment below   PATIENT EDUCATION: Education details: rationale for interventions, HEP  Person educated: Patient Education method: Explanation, Demonstration, Tactile cues, Verbal cues Education  comprehension: verbalized understanding, returned demonstration, verbal cues required, tactile cues required, and needs further education      HOME EXERCISE PROGRAM: Access Code: [email or web address] URL: [email or web address] Date: 03/30/2024 Prepared by: Mayme Spearman  Exercises - Seated Gripping Towel  - 2-3 x daily - 1 sets - 8 reps - Sit to Stand with Armchair  - 2-3 x daily - 1 sets - 5 reps - Seated Wrist Flexion and Extension with Towel Twist  - 2-3 x daily - 1 sets - 8-10 reps  ASSESSMENT:  CLINICAL IMPRESSION: 04/02/2024 ***  *** Pt arrives w/ report of elbow>hip pain, improved BP with medication change and follow up with PCP. Despite improvement in BP overall, it remains outside of appropriate limits for PT today which limits our ability to perform treatment. Continued education as above. Given report of generally improved BP however, we do prescribe additional elbow exercise for HEP to maximize progress and time spent educating on appropriate performance/modification as indicated. No adverse events, pt verbalizes agreement/understanding w/ current plan. Pt departs today's session in no acute distress, all voiced questions/concerns addressed appropriately from PT perspective.     Per eval - Patient is a 48 y.o. woman who was seen today for physical therapy evaluation and treatment for R hip and R elbow pain. Pt endorses chronic issues worsening to the point where she is no longer working, although she endorses improvement in symptoms in last month without working. She endorses difficulty w/ lifting, standing, and walking. On exam she demonstrates concordant limitations in grip strength, LE strength, and altered kinematics w/ gait/transfers. Tolerates exam/HEP well without adverse event or increase in resting pain. Recommend trial of skilled PT to address aforementioned deficits with aim of improving functional tolerance and reducing pain with typical activities. Pt departs today's  session in no acute distress, all voiced concerns/questions addressed appropriately from PT perspective.    OBJECTIVE IMPAIRMENTS: Abnormal gait, decreased activity tolerance, decreased balance, decreased endurance, decreased mobility, difficulty walking, decreased ROM, decreased strength, impaired perceived functional ability, impaired sensation, impaired UE functional use, improper body mechanics, postural dysfunction, and pain.   ACTIVITY LIMITATIONS: carrying, lifting, bending, standing, squatting, sleeping, stairs, transfers, and locomotion level  PARTICIPATION LIMITATIONS: meal prep, cleaning, laundry, community activity, and occupation  PERSONAL FACTORS: Time since onset of injury/illness/exacerbation and 3+ comorbidities: anemia, anxiety, HTN, depression, GERD, PCOS, DM2, hx gastric bypass are also affecting patient's functional outcome.   REHAB POTENTIAL: Fair given chronicity and comorbidities  CLINICAL DECISION MAKING: Evolving/moderate complexity  EVALUATION COMPLEXITY: Moderate   GOALS:   SHORT TERM GOALS: Target date: 03/25/2024  Pt will demonstrate appropriate understanding and performance of initially prescribed HEP in order to facilitate improved independence with management of symptoms.  Baseline: HEP established   03/30/24: deferred given first follow up visit Goal status: ONGOING  2. Pt will report at least 25% improvement in overall pain levels over past week in order to facilitate improved tolerance to typical daily activities.   Baseline: 3-7/10 03/30/24: deferred given first follow up visit Goal status: ONGOING  LONG TERM GOALS: Target date: 04/15/2024  Pt will score 55/80 or greater on LEFS in  order to demonstrate improved perception of function due to symptoms ([facility] 9 pts) Baseline: 41/80 Goal status: INITIAL  2.  Pt will demonstrate at least 50% improvement in BIL grip strength to facilitate improved functional tolerance.  Baseline: see MMT chart  above Goal status: INITIAL  3.  Pt will report at least 50% decrease in overall pain levels in past week in order to facilitate improved tolerance to basic ADLs/mobility.   Baseline: 3-7/10  Goal status: INITIAL    4.  Pt will be able to perform 5xSTS in less than or equal to 16sec in order to demonstrate reduced fall risk and improved functional independence ([facility] 5xSTS = 2.3 sec). Baseline: 26 sec, UE support and pain Goal status: INITIAL   5. Pt will demonstrate at least 4/5 MMT throughout tested groups in order to facilitate improved functional tolerance.  Baseline: see MMT chart above  Goal status: INITIAL  PLAN:  PT FREQUENCY: 2x/week  PT DURATION: 6 weeks  PLANNED INTERVENTIONS: 97164- PT Re-evaluation, 97750- Physical Performance Testing, 97110-Therapeutic exercises, 97530- Therapeutic activity, [id number]- Neuromuscular re-education, 97535- Self Care, 16109- Manual therapy, [phone]- Gait training, [phone]- Aquatic Therapy, [phone]- Electrical stimulation (unattended), Patient/Family education, Balance training, Stair training, Taping, Dry Needling, Joint mobilization, Spinal mobilization, Cryotherapy, and Moist heat  PLAN FOR NEXT SESSION: Review/update HEP PRN. Work on [employer] exercises as appropriate with emphasis on grip/UE strength, LE strength, and balance/transfers. Symptom modification strategies as indicated/appropriate. Monitor vitals to ensure BP in appropriate levels.    Lovett Ruck PT, DPT 04/02/2024 7:37 AM     For all possible CPT codes, reference the Planned Interventions line above.     Check all conditions that are expected to impact treatment: {Conditions expected to impact treatment:Musculoskeletal disorders

## 2024-04-02 NOTE — Telephone Encounter (Signed)
 Called and spoke with pt re: this mornings appt, she stated she thought appt was at 12. Confirmed date/time of next appt, she denied questions/concerns

## 2024-04-06 NOTE — Therapy (Signed)
 OUTPATIENT PHYSICAL THERAPY TREATMENT   Patient Name: [name] MRN: [id number] DOB:1976/09/04, 48 y.o., female Today's Date: 04/06/2024  END OF SESSION:      Past Medical History:  Diagnosis Date   Anemia    Anxiety    Blood transfusion without reported diagnosis    Carpal tunnel syndrome, bilateral    Cataract    removed bilateral   Chronic hypertension    Depression    GERD (gastroesophageal reflux disease)    Gestational diabetes    H. pylori infection    dx  09-02-2014-- treated w/ antibiotic   Hypertension    recently low pressures    Hypothyroid    Irregular menstrual cycle    Menorrhagia    PCOS (polycystic ovarian syndrome)    Sleep apnea    Type 2 diabetes mellitus (HCC)    Past Surgical History:  Procedure Laterality Date   CARPAL TUNNEL RELEASE Bilateral 10/07/2014   Procedure: CARPAL TUNNEL RELEASE BILATERAL;  Surgeon: Shellie Dials, MD;  Location: [facility] [place];  Service: Orthopedics;  Laterality: Bilateral;   CESAREAN SECTION  10/29/1993   CESAREAN SECTION WITH BILATERAL TUBAL LIGATION N/A 03/27/2019   Procedure: CESAREAN SECTION WITH BILATERAL TUBAL LIGATION;  Surgeon: Matt Song, MD;  Location: [facility];  Service: Obstetrics;  Laterality: N/A;   DILITATION & CURRETTAGE/HYSTROSCOPY WITH NOVASURE ABLATION N/A 11/08/2021   Procedure: DILATATION & CURETTAGE/HYSTEROSCOPY WITH NOVASURE ABLATION;  Surgeon: Matt Song, MD;  Location: [facility] [place];  Service: Gynecology;  Laterality: N/A;   excessive skin removal     legs,stomach,arms   EYE SURGERY  09/13/2020   cataract bilateral   GASTRIC ROUX-EN-Y N/A 03/12/2017   Procedure: LAPAROSCOPIC ROUX-EN-Y GASTRIC BYPASS WITH UPPER ENDOSCOPY;  Surgeon: Dorrie Gaudier Alphonso Aschoff, MD;  Location: [facility];  Service: General;  Laterality: N/A;   HEMATOMA EVACUATION  12/09/2021   Procedure: EVACUATION HEMATOMA;  Surgeon: Alger Infield, MD;  Location: [facility];  Service:  Plastics;;   I & D EXTREMITY N/A 12/09/2021   Procedure: IRRIGATION AND DEBRIDEMENT ABDOMEN;  Surgeon: Alger Infield, MD;  Location: [facility];  Service: Plastics;  Laterality: N/A;   PANNICULECTOMY N/A 12/08/2021   Procedure: PANNICULECTOMY;  Surgeon: Alger Infield, MD;  Location: [facility];  Service: Plastics;  Laterality: N/A;   TUBAL LIGATION  03/27/2019   UPPER GI ENDOSCOPY  03/12/2017   Procedure: UPPER GI ENDOSCOPY;  Surgeon: Dorrie Gaudier Alphonso Aschoff, MD;  Location: [facility];  Service: General;;   Patient Active Problem List   Diagnosis Date Noted   S/P panniculectomy 12/09/2021   Iron  deficiency anemia 06/26/2021   Vaginal bleeding in pregnancy, third trimester 03/11/2019   GBS bacteriuria 01/22/2019   Pregnancy of unknown anatomic location 08/10/2018   Intertrigo 11/22/2017   S/P gastric bypass 04/04/2017   Morbid obesity (HCC) 03/12/2017   Hypothyroidism 09/11/2016   Allergic rhinitis due to pollen 06/29/2015   Anxiety and depression 06/29/2015   Gastroesophageal reflux disease without esophagitis 06/29/2015   OSA on CPAP 06/29/2015   History of syphilis 05/23/2015   Irregular menstrual cycle 11/12/2013   POLYCYSTIC OVARIAN DISEASE 06/20/2009    PCP: Valere Gata, MD  REFERRING PROVIDER: Wes Hamman, MD  REFERRING DIAG: [phone] (ICD-10-CM) - Pain in right hip M77.11 (ICD-10-CM) - Lateral epicondylitis, right elbow  THERAPY DIAG:  No diagnosis found.  Rationale for Evaluation and Treatment: Rehabilitation  ONSET DATE: ~ 8 years ago; gradual onset  SUBJECTIVE:  SUBJECTIVE STATEMENT: 04/06/2024 ***  *** Pt states she is following up with PCP again next week (6/10), states she was cleared to resume PT and BP has been better although remains hypertensive. R elbow still  feeling irritated with gripping and opening objects. States her hip and back bothers her in mornings.    Per eval - Pt states symptoms began around 8 years ago - had gastric bypass around 2018, she feels like symptoms began around same time she started trying to lose weight. Reports issues with carpal tunnel and UE numbness since around 2000. Pt states symptoms are worse in the mornings, and with work activities. She reports L sided symptoms as well but more so on R. Stopped working about a month ago, states symptoms have improved some since. R lateral thigh numbness at times, less so on L side, feels this has also improved since she stopped working. Reports cramping in feet and shins at times. Pt does report hx of known incontinence, has received pelvic PT and is in communication with providers. Has surgery scheduled for June 25th per pt. Pt describes her incontinence as chronic/stable. Hand dominance: Right  PERTINENT HISTORY: anemia, anxiety, HTN, depression, GERD, PCOS, DM2, hx gastric bypass  PAIN:  Are you having pain: 1-2/10 R elbow; no hip/back pain ***   Per eval -  Location/description: R lateral elbow to wrist; R hip and posterior thigh, throbbing Best-worst over past week: 3-7/10  - aggravating factors: standing, walking, picking up child, transfers - Easing factors: medication, massage  PRECAUTIONS: None  WEIGHT BEARING RESTRICTIONS: No  FALLS:  Has patient fallen in last 6 months? No but does endorse near falls due to jolts of hip pain  LIVING ENVIRONMENT: Lives with husband and 7 year old. 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  DIAGNOSTIC FINDINGS:  02/25/24 hip XR: "X-rays of the pelvis and right hip show mild to moderate osteoarthritis  with decreased joint space. "  PATIENT SURVEYS:  LEFS 41/80; QuickDASH deferred given time constraints  COGNITION: Overall cognitive status: Within functional limits for tasks assessed     SENSATION: Reports diminished sensation BIL lateral thighs and R lateral shoulder, otherwise LT intact BIL UE/LE Negative hoffman or tromner signs  No clonus either LE  POSTURE: Rounded shoulders, forward head  UPPER EXTREMITY ROM:   ROM Right eval Left eval  Shoulder flexion    Shoulder extension    Shoulder abduction    Shoulder adduction    Shoulder internal rotation    Shoulder external rotation    Elbow flexion full   Elbow extension Full with mild pain   Wrist flexion    Wrist extension    Wrist ulnar deviation    Wrist radial deviation    Wrist pronation 70   Wrist supination 80   (Blank rows = not tested)   LOWER EXTREMITY ROM:      Right eval Left eval  Hip flexion    Hip extension    Hip internal rotation    Hip external rotation    Knee extension    Knee flexion    (Blank rows = not tested) (Key: WFL =  within functional limits not formally assessed, * = concordant pain, s = stiffness/stretching sensation, NT = not tested)  Comments:    UPPER EXTREMITY MMT:  MMT Right eval Left eval  Elbow flexion    Elbow extension    Wrist flexion    Wrist extension    Wrist ulnar deviation    Wrist radial deviation    Wrist pronation    Wrist supination    Grip strength (lbs) 20# 10#  (Blank rows = not tested)  LOWER EXTREMITY MMT:    MMT Right eval Left eval  Hip flexion 3+ * 4-  Hip abduction (modified sitting) 4 4  Hip internal rotation    Hip external rotation    Knee flexion 4- * 4  Knee extension 4-* 4  Ankle dorsiflexion     (Blank rows = not tested) (Key: WFL = within functional limits not formally assessed, * =  concordant pain, s = stiffness/stretching sensation, NT = not tested)  Comments:    SHOULDER SPECIAL TESTS: Deferred given time constraints  JOINT MOBILITY TESTING:  Deferred given time constraints  FUNCTIONAL TEST: 5xSTS 26 sec with LE pain, UE support Gait mechanics w/ increased lateral weight shifting, reduced stance time on RLE, reduced gait speed/cadence                                                                                                                             TREATMENT DATE:  [facility] Adult PT Treatment:                                                DATE: 04/07/24 Vitals: BP LUE ***  Therapeutic Exercise: *** Manual Therapy: *** Neuromuscular re-ed: *** Therapeutic Activity: *** Modalities: *** Self Care: ***    [facility] Adult PT Treatment:                                                DATE: 03/30/24 Vitals: BP LUE 173/102; after a couple minutes rest BP 186/107; pt asymptomatic  Therapeutic Exercise: HEP discussion/education with addition of towel twist, handout provided, education on appropriate setup/performance at home  Self Care: Monitoring vitals, education re: vitals as they pertain to activity/pain, continuing communication w/ providers, rationale for deferring further treatment today; use of HEP as indicated and trying to perform at less busy/stressful times of day, continued monitoring of BP, stress as it relates to pain and vitals   [facility] Adult PT Treatment:  DATE: 03/17/24 Vitals: BP LUE 184/105 asymptomatic; after 4 min rest ~ 190/108  Self Care: Monitoring vitals, education/discussion re: vitals as they pertain to PT, red flags and appropriate action should they occur, communication w/ provider; see assessment below   PATIENT EDUCATION: Education details: rationale for interventions, HEP  Person educated: Patient Education method: Explanation, Demonstration, Tactile cues, Verbal cues Education  comprehension: verbalized understanding, returned demonstration, verbal cues required, tactile cues required, and needs further education      HOME EXERCISE PROGRAM: Access Code: [email or web address] URL: [email or web address] Date: 03/30/2024 Prepared by: Mayme Spearman  Exercises - Seated Gripping Towel  - 2-3 x daily - 1 sets - 8 reps - Sit to Stand with Armchair  - 2-3 x daily - 1 sets - 5 reps - Seated Wrist Flexion and Extension with Towel Twist  - 2-3 x daily - 1 sets - 8-10 reps  ASSESSMENT:  CLINICAL IMPRESSION: 04/06/2024 ***  *** Pt arrives w/ report of elbow>hip pain, improved BP with medication change and follow up with PCP. Despite improvement in BP overall, it remains outside of appropriate limits for PT today which limits our ability to perform treatment. Continued education as above. Given report of generally improved BP however, we do prescribe additional elbow exercise for HEP to maximize progress and time spent educating on appropriate performance/modification as indicated. No adverse events, pt verbalizes agreement/understanding w/ current plan. Pt departs today's session in no acute distress, all voiced questions/concerns addressed appropriately from PT perspective.     Per eval - Patient is a 48 y.o. woman who was seen today for physical therapy evaluation and treatment for R hip and R elbow pain. Pt endorses chronic issues worsening to the point where she is no longer working, although she endorses improvement in symptoms in last month without working. She endorses difficulty w/ lifting, standing, and walking. On exam she demonstrates concordant limitations in grip strength, LE strength, and altered kinematics w/ gait/transfers. Tolerates exam/HEP well without adverse event or increase in resting pain. Recommend trial of skilled PT to address aforementioned deficits with aim of improving functional tolerance and reducing pain with typical activities. Pt departs today's  session in no acute distress, all voiced concerns/questions addressed appropriately from PT perspective.    OBJECTIVE IMPAIRMENTS: Abnormal gait, decreased activity tolerance, decreased balance, decreased endurance, decreased mobility, difficulty walking, decreased ROM, decreased strength, impaired perceived functional ability, impaired sensation, impaired UE functional use, improper body mechanics, postural dysfunction, and pain.   ACTIVITY LIMITATIONS: carrying, lifting, bending, standing, squatting, sleeping, stairs, transfers, and locomotion level  PARTICIPATION LIMITATIONS: meal prep, cleaning, laundry, community activity, and occupation  PERSONAL FACTORS: Time since onset of injury/illness/exacerbation and 3+ comorbidities: anemia, anxiety, HTN, depression, GERD, PCOS, DM2, hx gastric bypass are also affecting patient's functional outcome.   REHAB POTENTIAL: Fair given chronicity and comorbidities  CLINICAL DECISION MAKING: Evolving/moderate complexity  EVALUATION COMPLEXITY: Moderate   GOALS:   SHORT TERM GOALS: Target date: 03/25/2024  Pt will demonstrate appropriate understanding and performance of initially prescribed HEP in order to facilitate improved independence with management of symptoms.  Baseline: HEP established   03/30/24: deferred given first follow up visit Goal status: ONGOING  2. Pt will report at least 25% improvement in overall pain levels over past week in order to facilitate improved tolerance to typical daily activities.   Baseline: 3-7/10 03/30/24: deferred given first follow up visit Goal status: ONGOING  LONG TERM GOALS: Target date: 04/15/2024  Pt will score 55/80 or greater on LEFS in  order to demonstrate improved perception of function due to symptoms ([facility] 9 pts) Baseline: 41/80 Goal status: INITIAL  2.  Pt will demonstrate at least 50% improvement in BIL grip strength to facilitate improved functional tolerance.  Baseline: see MMT chart  above Goal status: INITIAL  3.  Pt will report at least 50% decrease in overall pain levels in past week in order to facilitate improved tolerance to basic ADLs/mobility.   Baseline: 3-7/10  Goal status: INITIAL    4.  Pt will be able to perform 5xSTS in less than or equal to 16sec in order to demonstrate reduced fall risk and improved functional independence ([facility] 5xSTS = 2.3 sec). Baseline: 26 sec, UE support and pain Goal status: INITIAL   5. Pt will demonstrate at least 4/5 MMT throughout tested groups in order to facilitate improved functional tolerance.  Baseline: see MMT chart above  Goal status: INITIAL  PLAN:  PT FREQUENCY: 2x/week  PT DURATION: 6 weeks  PLANNED INTERVENTIONS: 97164- PT Re-evaluation, 97750- Physical Performance Testing, 97110-Therapeutic exercises, 97530- Therapeutic activity, [id number]- Neuromuscular re-education, 97535- Self Care, [id number]- Manual therapy, [phone]- Gait training, [phone]- Aquatic Therapy, [phone]- Electrical stimulation (unattended), Patient/Family education, Balance training, Stair training, Taping, Dry Needling, Joint mobilization, Spinal mobilization, Cryotherapy, and Moist heat  PLAN FOR NEXT SESSION: Review/update HEP PRN. Work on [employer] exercises as appropriate with emphasis on grip/UE strength, LE strength, and balance/transfers. Symptom modification strategies as indicated/appropriate. Monitor vitals to ensure BP in appropriate levels.    Lovett Ruck PT, DPT 04/06/2024 3:09 PM     For all possible CPT codes, reference the Planned Interventions line above.     Check all conditions that are expected to impact treatment: {Conditions expected to impact treatment:Musculoskeletal disorders

## 2024-04-07 ENCOUNTER — Ambulatory Visit: Admitting: Physical Therapy

## 2024-04-07 ENCOUNTER — Encounter: Payer: Self-pay | Admitting: Physical Therapy

## 2024-04-07 DIAGNOSIS — M6281 Muscle weakness (generalized): Secondary | ICD-10-CM

## 2024-04-07 DIAGNOSIS — M25551 Pain in right hip: Secondary | ICD-10-CM | POA: Diagnosis not present

## 2024-04-07 DIAGNOSIS — M25521 Pain in right elbow: Secondary | ICD-10-CM

## 2024-04-16 ENCOUNTER — Encounter (HOSPITAL_COMMUNITY): Payer: Self-pay | Admitting: Obstetrics and Gynecology

## 2024-04-16 ENCOUNTER — Telehealth: Payer: Self-pay

## 2024-04-16 ENCOUNTER — Ambulatory Visit

## 2024-04-16 NOTE — Progress Notes (Addendum)
 Called Dr Leslee Rase office and spoke w/ OR scheduler , Architect.  Needed to let Dr Leslee Rase know that patient had lab work done at her PCP office on 04-07-2024 CBC/ CMP/ A1c results are in epic and Dr Leslee Rase ordered a pre-op CBC, does Dr Leslee Rase want to repeat this or not? Received call back from Dr Leslee Rase MD via phone , she asked what the Hg & Hct was.  I stated to Dr Leslee Rase the  Hg 12.3 and Hct 38.1.  Dr Leslee Rase stated that patient does not need CBC repeated.  I will discontinue her pre-op order per verbal order.   Spoke w/ via phone for pre-op interview--- pt Lab needs dos----   upt      Lab results------ lab appt 04-20-2024 @ 1300 getting T&S/ EKG Current CMP and A1c dated 04-07-2024 result in epic COVID test -----patient states asymptomatic no test needed Arrive at ------- 0815 on 04-22-2024 NPO after MN w/ exception sips of water w/ meds Pre-Surgery Ensure or G2: n/a  Med rec completed Medications to take morning of surgery ----- norvasc , synthroid , gabapentin , claritin , protonix  Diabetic medication ----- n/a  GLP1 agonist last dose: n/a GLP1 instructions:  Patient instructed no nail polish to be worn day of surgery Patient instructed to bring photo id and insurance card day of surgery  Patient aware to have Driver (ride ) / caregiver    for 24 hours after surgery -  Pt stated at first she did have not one.  Then when I started to explain she said she did have a driver but no name at this time.   So,  I explained to the patient she will need name and phone number of both driver and caregiver for next 24 hours even though they are not staying in the facility during surgery.  Pre-op nurse may possibly call them to verify, anesthesia will not do surgery if this is not arranged.  Pt verbalized understanding but she does not want anybody to know she is having this surgery.  I explained to patient that whomever is taking care of her and needs to be taking care of her the next 24 hours  will need to know because the nurse will be going over discharge instructions with them, she said okay.  And the driver will be picking her up at the hospital so she will need to let the pre-op nurse know what she can and she can not say to them. Also , verified with patient if nurse, surgeon, or anesthesia needed anything would the daughter, Leontine Rana , in epic as emergency contact be the person she would want called.  Pt stated yes and verified her phone in epic was correct, [phone], it was okay to speak to her daughter about the surgery and if anything or decision needed to be made.   Patient Special Instructions ----- will pick up bag w/ soap and written instructions at lab appt Pre-Op special Instructions ----- n/a  Patient verbalized understanding of instructions that were given at this phone interview. Patient denies chest pain, sob, fever, cough at the interview.    Anesthesia Review:  HTN;  OSA (no cpap since before gastric bypass 2018);  diet controlled DM2;  05/ 2018  s/p Roux-en-y gastric bypass;  hypothyroidism postablative 2016 Pt denies cardiac s&s, sob, and no peripheral swelling.  PCP:  Dr Rocky Cipro [facility] 04-07-2024)  Chest x-ray : 12-09-2021 EKG : 08-18-2022 Echo : no Stress test: no Cardiac Cath :  no  Activity level:  denied sob w/ any activity Sleep Study/ CPAP : yes, study in epic 04-11-2015/ no cpap Fasting Blood Sugar :  105    / Checks Blood Sugar -- times a day:  2 times wkly , fasting  Blood Thinner/ Instructions /Last Dose: no ASA / Instructions/ Last Dose : no

## 2024-04-16 NOTE — Telephone Encounter (Signed)
 Spoke with patient regarding missed appointment. She states I'm having a lot of abdominal pain. She confirms that she plans to attend last scheduled PT session prior to her surgery next week. - MJ

## 2024-04-16 NOTE — Pre-Procedure Instructions (Addendum)
 Surgical Instructions   Your procedure is scheduled on :  Wednesday,  04-22-2024. Report to [facility] Main Entrance A at 8:15  A.M., then check in with the Admitting office. Any questions or running late day of surgery: call [phone]  Questions prior to your surgery date: call [phone], Monday-Friday, 8am-4pm. If you experience any cold or flu symptoms such as cough, fever, chills, shortness of breath, etc. between now and your scheduled surgery, please notify your surgeon office.    Remember:  Do not eat after any food and do not drink any liquids midnight the night before your surgery.  This includes no water ,  candy,  gum,  and  mints.    Take these medicines the morning of surgery with A SIPSOF WATER : Gabapentin  (neurontin ) Fluoxetine  (prozac ) Loratadine  (claritin ) Amlodipine  (norvasc ) Levothyroxine  (synthroid ) Pantoproprazole (protonix ) ---Anesthesia wants you to take this medication morning                                                   Of surgery even though it is as needed  ~DO NOT TAKE lisinopril  morning of surgery due to the type of medication   May take these medicines IF NEEDED: Fluticasone  (flonase )     HOW TO MANAGE YOUR DIABETES BEFORE AND AFTER SURGERY  Why is it important to control my blood sugar before and after surgery? Improving blood sugar levels before and after surgery helps healing and can limit problems. A way of improving blood sugar control is eating a healthy diet by:  Eating less sugar and carbohydrates  Increasing activity/exercise  Talking with your doctor about reaching your blood sugar goals High blood sugars (greater than 180 mg/dL) can raise your risk of infections and slow your recovery, so you will need to focus on controlling your diabetes during the weeks before surgery. Make sure that the doctor who takes care of your diabetes knows about your planned surgery including the date and location.  How do I manage my  blood sugar before surgery? Check your blood sugar at least 4 times a day, starting 2 days before surgery, to make sure that the level is not too high or low.  Check your blood sugar the morning of your surgery when you wake up  If your blood sugar is less than 70 mg/dL, you will need to treat for low blood sugar: Treat a low blood sugar (less than 70 mg/dL) with  cup of clear juice (cranberry or apple) Recheck blood sugar in 15 minutes after treatment (to make sure it is greater than 70 mg/dL). If your blood sugar is not greater than 70 mg/dL on recheck, call [phone] for further instructions. Report your blood sugar to the short stay nurse when you get to Short Stay.  If you are admitted to the hospital after surgery: Your blood sugar will be checked by the staff and you will probably be given insulin  after surgery (instead of oral diabetes medicines) to make sure you have good blood sugar levels. The goal for blood sugar control after surgery is 80-180 mg/dL.    One week prior to surgery, STOP taking any Aspirin (unless otherwise instructed by your surgeon) Aleve , Naproxen , Ibuprofen , Motrin , Advil , Goody's, BC's, all herbal medications, fish oil, and non-prescription vitamins.  Do NOT Smoke (Tobacco/Vaping) and Do Not drink alcohol for 24 hours prior to your procedure.  If you use a CPAP at night, you may bring your mask/headgear for your overnight stay.   You will be asked to remove any contacts, glasses, piercing's, hearing aid's, dentures/partials prior to surgery. Please bring cases for these items if needed.    Patients discharged the day of surgery will not be allowed to drive home, and someone needs to stay with them for 24 hours.0 ~Please have name and phone of your driver and caregiver day of surgery.  SURGICAL WAITING ROOM VISITATION Patients may have no more than 2 support people in the waiting area - these visitors may rotate.   Pre-op nurse will  coordinate an appropriate time for 1 ADULT support person, who may not rotate, to accompany patient in pre-op.  Children under the age of 75 must have an adult with them who is not the patient and must remain in the main waiting area with an adult.  If the patient needs to stay at the hospital during part of their recovery, the visitor guidelines for inpatient rooms apply.  Please refer to the [facility] website for the visitor guidelines for any additional information.   If you received a COVID test during your pre-op visit  it is requested that you wear a mask when out in public, stay away from anyone that may not be feeling well and notify your surgeon if you develop symptoms. If you have been in contact with anyone that has tested positive in the last 10 days please notify you surgeon.      Pre-operative CHG Bathing Instructions   You can play a key role in reducing the risk of infection after surgery. Your skin needs to be as free of germs as possible. You can reduce the number of germs on your skin by washing with CHG (chlorhexidine  gluconate) soap before surgery. CHG is an antiseptic soap that kills germs and continues to kill germs even after washing.   DO NOT use if you have an allergy to chlorhexidine /CHG or antibacterial soaps. If your skin becomes reddened or irritated, stop using the CHG and notify Pre-op nurse day of surgery.              TAKE A SHOWER THE NIGHT BEFORE SURGERY AND THE DAY OF SURGERY    Please keep in mind the following:  DO NOT shave, including legs and underarms, 48 hours prior to surgery.   You may shave your face before/day of surgery.  Place clean sheets on your bed the night before surgery Use a clean washcloth (not used since being washed) for each shower. DO NOT sleep with pet's night before surgery.  CHG Shower Instructions:  Wash your face and private area with normal soap. If you choose to wash your hair, wash first with your normal shampoo.   After you use shampoo/soap, rinse your hair and body thoroughly to remove shampoo/soap residue.  Turn the water OFF and apply half the bottle of CHG soap to a CLEAN washcloth.  Apply CHG soap ONLY FROM YOUR NECK DOWN TO YOUR TOES (washing for 3-5 minutes)  DO NOT use CHG soap on face, private areas, open wounds, or sores.  Pay special attention to the area where your surgery is being performed.  If you are having back surgery, having someone wash your back for you may be helpful. Wait 2 minutes after CHG soap is applied, then you may rinse  off the CHG soap.  Pat dry with a clean towel  Put on clean pajamas    Additional instructions for the day of surgery: DO NOT APPLY any lotions,  powder,  oils, deodorants (may use underarm deodorant) , cologne/  perfumes or makeup Do not wear jewelry / piercing's/ metal/ permanent jewelry must be removed prior to arrival day of surgery.  (No plastic piercing) Do not wear nail polish, gel polish, artificial nails, or any other type of covering on natural finger nails (toe nails are okay) Do not bring valuables to the hospital. [facility] is not responsible for valuables/personal belongings. Put on clean/comfortable clothes.  Please brush your teeth.  Ask your nurse before applying any prescription medications to the skin.

## 2024-04-20 ENCOUNTER — Ambulatory Visit: Admitting: Physical Therapy

## 2024-04-20 ENCOUNTER — Encounter (HOSPITAL_COMMUNITY)
Admission: RE | Admit: 2024-04-20 | Discharge: 2024-04-20 | Disposition: A | Discharge status: Home / Self Care | Source: Ambulatory Visit | Attending: Obstetrics and Gynecology | Admitting: Obstetrics and Gynecology

## 2024-04-20 ENCOUNTER — Encounter: Payer: Self-pay | Admitting: Physical Therapy

## 2024-04-20 DIAGNOSIS — M25521 Pain in right elbow: Secondary | ICD-10-CM

## 2024-04-20 DIAGNOSIS — Z01818 Encounter for other preprocedural examination: Secondary | ICD-10-CM | POA: Diagnosis present

## 2024-04-20 DIAGNOSIS — M6281 Muscle weakness (generalized): Secondary | ICD-10-CM

## 2024-04-20 DIAGNOSIS — M25551 Pain in right hip: Secondary | ICD-10-CM

## 2024-04-20 LAB — TYPE AND SCREEN
ABO/RH(D): O POS
Antibody Screen: NEGATIVE
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
## 2024-04-20 NOTE — Therapy (Signed)
 OUTPATIENT PHYSICAL THERAPY DISCHARGE + RECERTIFICATION   Patient Name: [name] MRN: [id number] DOB:14-Dec-1975, 48 y.o., female Today's Date: 04/20/2024   PHYSICAL THERAPY DISCHARGE SUMMARY  Visits from Start of Care: 5  Current functional level related to goals / functional outcomes: Able to perform majority of ADLs and household tasks with increased pain   Remaining deficits: Pain, weakness   Education / Equipment: HEP, discharge education, follow up with provider; see below for details   Patient agrees to discharge. Patient goals were partially met. Patient is being discharged due to upcoming surgery.   END OF SESSION:  PT End of Session - 04/20/24 1219     Visit Number 5    Number of Visits 13    Authorization Type [employer]    Authorization Time Period 7 visits 03/04/24-05/02/24    Authorization - Visit Number 4    Authorization - Number of Visits 7    PT Start Time 1219    PT Stop Time 1242    PT Time Calculation (min) 23 min    Activity Tolerance Patient tolerated treatment well              Past Medical History:  Diagnosis Date   Anxiety    Depression    Diabetes mellitus type 2, diet-controlled (HCC)    GERD (gastroesophageal reflux disease)    History of gestational diabetes    History of Helicobacter pylori infection 09/02/2014   - treated w/ antibiotic   History of hyperthyroidism 2016   previous endocrinology--- dr kassie;  per MD note dx beginning of 2016 on medication then s/p RAI 10-13-2015  started synthroid  2017   Hypertension    recently low pressures    Hypothyroidism, 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  by dr stevie   Urge incontinence of urine    Wears glasses    Past Surgical History:  Procedure Laterality Date   CARPAL TUNNEL RELEASE Bilateral 10/07/2014   Procedure: CARPAL TUNNEL RELEASE BILATERAL;  Surgeon: Prentice LELON Pagan, MD;  Location: [facility];  Service: Orthopedics;  Laterality: Bilateral;   CATARACT EXTRACTION W/ INTRAOCULAR LENS IMPLANT Bilateral 2021   CESAREAN SECTION  1995   CESAREAN SECTION WITH BILATERAL TUBAL LIGATION N/A 03/27/2019   Procedure: CESAREAN SECTION WITH BILATERAL TUBAL LIGATION;  Surgeon: Sarrah Browning, MD;  Location: [facility];  Service: Obstetrics;  Laterality: N/A;   DILITATION & CURRETTAGE/HYSTROSCOPY WITH NOVASURE ABLATION N/A 11/08/2021   Procedure: DILATATION & CURETTAGE/HYSTEROSCOPY WITH NOVASURE ABLATION;  Surgeon: Sarrah Browning, MD;  Location: [facility];  Service: Gynecology;  Laterality: N/A;   GASTRIC ROUX-EN-Y N/A 03/12/2017   Procedure: LAPAROSCOPIC ROUX-EN-Y GASTRIC BYPASS WITH UPPER ENDOSCOPY;  Surgeon: stevie, Herlene Righter, MD;  Location: [facility];  Service: General;  Laterality: N/A;   HEMATOMA EVACUATION  12/09/2021   Procedure: EVACUATION HEMATOMA;  Surgeon: Arelia Filippo, MD;  Location: [facility];  Service: Plastics;;   I & D EXTREMITY N/A 12/09/2021   Procedure: IRRIGATION AND DEBRIDEMENT ABDOMEN;  Surgeon: Arelia Filippo, MD;  Location: [facility];  Service: Plastics;  Laterality: N/A;   LIPECTOMY Bilateral 12/17/2022   @[facility]  by dr b. daved;  bilateral  upper arm / axillae   LIPECTOMY Bilateral 04/16/2022   @[facility]  by dr b. arelia;   thighs   PANNICULECTOMY N/A 12/08/2021   Procedure:  PANNICULECTOMY;  Surgeon: Arelia Filippo, MD;  Location: [facility];  Service: Plastics;  Laterality: N/A;   UPPER GI ENDOSCOPY  03/12/2017   Procedure: UPPER GI ENDOSCOPY;  Surgeon: Stevie, Herlene Righter, MD;  Location: [facility];  Service: General;;   Patient Active Problem List   Diagnosis Date Noted   S/P panniculectomy 12/09/2021   Iron  deficiency anemia 06/26/2021   Vaginal bleeding in pregnancy, third trimester 03/11/2019   GBS bacteriuria 01/22/2019   Pregnancy of unknown anatomic location 08/10/2018   Intertrigo 11/22/2017   S/P gastric bypass 04/04/2017   Morbid obesity (HCC) 03/12/2017   Hypothyroidism 09/11/2016   Allergic rhinitis due to pollen 06/29/2015   Anxiety and depression 06/29/2015   Gastroesophageal reflux disease without esophagitis 06/29/2015   OSA on CPAP 06/29/2015   History of syphilis 05/23/2015   Irregular menstrual cycle 11/12/2013   POLYCYSTIC OVARIAN DISEASE 06/20/2009    PCP: Claudene Rayfield HERO, MD  REFERRING PROVIDER: Jerri Kay HERO, MD  REFERRING DIAG: [phone] (ICD-10-CM) - Pain in right hip M77.11 (ICD-10-CM) - Lateral epicondylitis, right elbow  THERAPY DIAG:  Pain in right hip  Pain in right elbow  Muscle weakness (generalized)  Rationale for Evaluation and Treatment: Rehabilitation  ONSET DATE: ~ 8 years ago; gradual onset  SUBJECTIVE:                                                                                                                                                                                      SUBJECTIVE STATEMENT: 04/20/2024: states she has been having intermittent hip/back pain, especially when carrying child. States her elbow continues to have trouble with lifting and opening objects. Continues to endorse fluctuating and sometimes severe pain, but states it does feel improved compared to start of care. Reports good HEP adherence. Discharging today d/t surgery Wednesday.    Per eval - Pt states  symptoms began around 8 years ago - had gastric bypass around 2018, she feels like symptoms began around same time she started trying to lose weight. Reports issues with  carpal tunnel and UE numbness since around 2000. Pt states symptoms are worse in the mornings, and with work activities. She reports L sided symptoms as well but more so on R. Stopped working about a month ago, states symptoms have improved some since. R lateral thigh numbness at times, less so on L side, feels this has also improved since she stopped working. Reports cramping in feet and shins at times. Pt does report hx of known incontinence, has received pelvic PT and is in communication with providers. Has surgery scheduled for June 25th per pt. Pt describes her incontinence as chronic/stable. Hand dominance: Right  PERTINENT HISTORY: anemia, anxiety, HTN, depression, GERD, PCOS, DM2, hx gastric bypass  PAIN:  Are you having pain: elbow 2/10, hip 1/10 Worst: elbow 12/10,  hip 10/10 Best: elbow 2/10, 1/10  Per eval -  Location/description: R lateral elbow to wrist; R hip and posterior thigh, throbbing Best-worst over past week: 3-7/10  - aggravating factors: standing, walking, picking up child, transfers - Easing factors: medication, massage  PRECAUTIONS: None  WEIGHT BEARING RESTRICTIONS: No  FALLS:  Has patient fallen in last 6 months? No but does endorse near falls due to jolts of hip pain  LIVING ENVIRONMENT: Lives with husband and 72 year old. 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  DIAGNOSTIC FINDINGS:  02/25/24 hip XR: X-rays of the pelvis and right hip  show mild to moderate osteoarthritis  with decreased joint space.   PATIENT SURVEYS:  LEFS 41/80; QuickDASH deferred given time constraints  04/20/24: LEFS 55/80  COGNITION: Overall cognitive status: Within functional limits for tasks assessed     SENSATION: Reports diminished sensation BIL lateral thighs and R lateral shoulder, otherwise LT intact BIL UE/LE Negative hoffman or tromner signs  No clonus either LE  POSTURE: Rounded shoulders, forward head  UPPER EXTREMITY ROM:   ROM Right eval Left eval  Shoulder flexion    Shoulder extension    Shoulder abduction    Shoulder adduction    Shoulder internal rotation    Shoulder external rotation    Elbow flexion full   Elbow extension Full with mild pain   Wrist flexion    Wrist extension    Wrist ulnar deviation    Wrist radial deviation    Wrist pronation 70   Wrist supination 80   (Blank rows = not tested)   LOWER EXTREMITY ROM:      Right eval Left eval  Hip flexion    Hip extension    Hip internal rotation    Hip external rotation    Knee extension    Knee flexion    (Blank rows = not tested) (Key: WFL = within functional limits not formally assessed, * = concordant pain, s = stiffness/stretching sensation, NT = not tested)  Comments:    UPPER EXTREMITY MMT:  MMT Right eval Left eval R/L 04/20/24   Elbow flexion     Elbow extension     Wrist flexion     Wrist extension     Wrist ulnar deviation     Wrist radial deviation     Wrist pronation  Wrist supination     Grip strength (lbs) 20# 10# 15# / 20#  (Blank rows = not tested)  LOWER EXTREMITY MMT:    MMT Right eval Left eval R/L 04/20/24   Hip flexion 3+ * 4- 4/4+  Hip abduction (modified sitting) 4 4 4+/4+  Hip internal rotation     Hip external rotation     Knee flexion 4- * 4 4+/4+  Knee extension 4-* 4 4+/4+  Ankle dorsiflexion      (Blank rows = not tested) (Key: WFL = within functional limits not formally assessed, * =  concordant pain, s = stiffness/stretching sensation, NT = not tested)  Comments:    SHOULDER SPECIAL TESTS: Deferred given time constraints  JOINT MOBILITY TESTING:  Deferred given time constraints  FUNCTIONAL TEST: 5xSTS 26 sec with LE pain, UE support Gait mechanics w/ increased lateral weight shifting, reduced stance time on RLE, reduced gait speed/cadence  04/20/24 5xSTS 15.78sec no pain, gentle UE support from thighs                                                                                                                              TREATMENT DATE:  [facility] Adult PT Treatment:                                                DATE: 04/20/24 Vitals: HR 76, BP 129/75  Therapeutic Activity: MSK assessment + education LEFS + education Vitals + education Education/discussion re: progress with PT, symptom behavior as it affects activity tolerance, PT goals/POC, discharge education, follow up with provider as indicated, appropriate activity modification and adherence to post op restrictions as advised by surgical team    PATIENT EDUCATION: Education details: PT POC, PT goals, progress with PT thus far, discharge planning, HEP, follow up with provider as needed Person educated: Patient Education method: Explanation, Demonstration, Verbal cues Education comprehension: verbalized understanding, returned demonstration      HOME EXERCISE PROGRAM: Access Code: [email or web address] URL: [email or web address] Date: 04/20/2024 Prepared by: Alm Jenny  Exercises - Seated Gripping Towel  - 2-3 x daily - 1 sets - 8 reps - Sit to Stand with Armchair  - 2-3 x daily - 1 sets - 5 reps - Seated Wrist Flexion and Extension with Towel Twist  - 2-3 x daily - 1 sets - 8-10 reps - Thumb Opposition  - 2-3 x daily - 1 sets - 5 reps - Seated March with Resistance  - 2-3 x daily - 1 sets - 10 reps - Seated Hip Abduction with Resistance  - 2-3 x daily - 1 sets - 10  reps  ASSESSMENT:  CLINICAL IMPRESSION: 04/20/2024: Pt arrives w/ report of continued fluctuations in symptoms. In discussion w/ pt, plan to discharge today given upcoming surgery this week that she reports will require at  least 6 week recovery. Education on activity modification and adherence to post op restrictions, following up with ortho as indicated. She demonstrates excellent improvements in MMT and 5xSTS, although pain ratings remain variable. Goals below, overall has done well with progress towards them despite limited ability to participate with PT. No adverse events, pt verbalizes agreement/understanding with discharge plan at this time. Pt departs today's session in no acute distress, all voiced questions/concerns addressed appropriately from PT perspective.     Per eval - Patient is a 48 y.o. woman who was seen today for physical therapy evaluation and treatment for R hip and R elbow pain. Pt endorses chronic issues worsening to the point where she is no longer working, although she endorses improvement in symptoms in last month without working. She endorses difficulty w/ lifting, standing, and walking. On exam she demonstrates concordant limitations in grip strength, LE strength, and altered kinematics w/ gait/transfers. Tolerates exam/HEP well without adverse event or increase in resting pain. Recommend trial of skilled PT to address aforementioned deficits with aim of improving functional tolerance and reducing pain with typical activities. Pt departs today's session in no acute distress, all voiced concerns/questions addressed appropriately from PT perspective.    OBJECTIVE IMPAIRMENTS: Abnormal gait, decreased activity tolerance, decreased balance, decreased endurance, decreased mobility, difficulty walking, decreased ROM, decreased strength, impaired perceived functional ability, impaired sensation, impaired UE functional use, improper body mechanics, postural dysfunction, and pain.    ACTIVITY LIMITATIONS: carrying, lifting, bending, standing, squatting, sleeping, stairs, transfers, and locomotion level  PARTICIPATION LIMITATIONS: meal prep, cleaning, laundry, community activity, and occupation  PERSONAL FACTORS: Time since onset of injury/illness/exacerbation and 3+ comorbidities: anemia, anxiety, HTN, depression, GERD, PCOS, DM2, hx gastric bypass are also affecting patient's functional outcome.   REHAB POTENTIAL: Fair given chronicity and comorbidities  CLINICAL DECISION MAKING: Evolving/moderate complexity  EVALUATION COMPLEXITY: Moderate   GOALS:   SHORT TERM GOALS: Target date: 03/25/2024  Pt will demonstrate appropriate understanding and performance of initially prescribed HEP in order to facilitate improved independence with management of symptoms.  Baseline: HEP established   03/30/24: deferred given first follow up visit 04/20/24: reports good HEP adherence Goal status: MET  2. Pt will report at least 25% improvement in overall pain levels over past week in order to facilitate improved tolerance to typical daily activities.   Baseline: 3-7/10 04/20/24: hip 1-10/10 elbow 2-12/10 Goal status: NOT MET  LONG TERM GOALS: Target date: 04/15/2024  Pt will score 55/80 or greater on LEFS in order to demonstrate improved perception of function due to symptoms (MCID 9 pts) Baseline: 41/80 04/20/24: 55/80 Goal status: MET  2.  Pt will demonstrate at least 50% improvement in BIL grip strength to facilitate improved functional tolerance.  Baseline: see MMT chart above 04/20/24: see MMT chart Goal status: PARTIALLY MET  3.  Pt will report at least 50% decrease in overall pain levels in past week in order to facilitate improved tolerance to basic ADLs/mobility.   Baseline: 3-7/10  04/20/24: hip 1-10/10 elbow 2-12/10  Goal status: NOT MET    4.  Pt will be able to perform 5xSTS in less than or equal to 16sec in order to demonstrate reduced fall risk and improved  functional independence (MCID 5xSTS = 2.3 sec). Baseline: 26 sec, UE support and pain 04/20/24: 15 sec Goal status: MET  5. Pt will demonstrate at least 4/5 MMT throughout tested groups in order to facilitate improved functional tolerance.  Baseline: see MMT chart above  04/20/24: see MMT chart  above  Goal status: MET  PLAN: DISCHARGE 04/20/24   Alm DELENA Jenny PT, DPT 04/20/2024 12:52 PM

## 2024-04-21 ENCOUNTER — Inpatient Hospital Stay (HOSPITAL_COMMUNITY): Admission: RE | Admit: 2024-04-21 | Source: Ambulatory Visit

## 2024-04-21 NOTE — Anesthesia Preprocedure Evaluation (Signed)
 Anesthesia Evaluation  Patient identified by MRN, date of birth, ID band Patient awake    Reviewed: Allergy & Precautions, NPO status , Patient's Chart, lab work & pertinent test results  History of Anesthesia Complications Negative for: history of anesthetic complications  Airway Mallampati: III  TM Distance: >3 FB Neck ROM: Full   Comment: Previous grade I view with MAC 3, easy mask Dental  (+) Dental Advisory Given   Pulmonary neg shortness of breath, sleep apnea (does not use CPAP) , neg COPD, neg recent URI   Pulmonary exam normal breath sounds clear to auscultation       Cardiovascular hypertension (amlodipine , lisinopril ), Pt. on medications and Pt. on home beta blockers (-) angina (-) Past MI, (-) Cardiac Stents and (-) CABG (-) dysrhythmias  Rhythm:Regular Rate:Normal     Neuro/Psych  PSYCHIATRIC DISORDERS Anxiety Depression    negative neurological ROS     GI/Hepatic Neg liver ROS,GERD  Medicated,,S/p gastric bypass   Endo/Other  diabetes (Hgb A1c 7.3), Type 2Hypothyroidism    Renal/GU negative Renal ROS     Musculoskeletal  (+) Arthritis , Osteoarthritis,    Abdominal  (+) + obese  Peds  Hematology  (+) Blood dyscrasia, anemia   Anesthesia Other Findings   Reproductive/Obstetrics Uterine leiomyoma, PCOS                             Anesthesia Physical Anesthesia Plan  ASA: 3  Anesthesia Plan: General   Post-op Pain Management: Tylenol  PO (pre-op)* and Toradol  IV (intra-op)*   Induction: Intravenous  PONV Risk Score and Plan: 4 or greater and Ondansetron , Dexamethasone , Midazolam , Scopolamine  patch - Pre-op and Treatment may vary due to age or medical condition  Airway Management Planned: Oral ETT  Additional Equipment:   Intra-op Plan:   Post-operative Plan: Extubation in OR  Informed Consent: I have reviewed the patients History and Physical, chart, labs and  discussed the procedure including the risks, benefits and alternatives for the proposed anesthesia with the patient or authorized representative who has indicated his/her understanding and acceptance.     Dental advisory given  Plan Discussed with: CRNA and Anesthesiologist  Anesthesia Plan Comments: (Risks of general anesthesia discussed including, but not limited to, sore throat, hoarse voice, chipped/damaged teeth, injury to vocal cords, nausea and vomiting, allergic reactions, lung infection, heart attack, stroke, and death. All questions answered. )        Anesthesia Quick Evaluation

## 2024-04-22 ENCOUNTER — Encounter (HOSPITAL_COMMUNITY)
Admission: RE | Disposition: A | Discharge status: Home / Self Care | Payer: Self-pay | Source: Home / Self Care | Attending: Obstetrics and Gynecology

## 2024-04-22 ENCOUNTER — Encounter (HOSPITAL_COMMUNITY): Payer: Self-pay | Admitting: Obstetrics and Gynecology

## 2024-04-22 ENCOUNTER — Ambulatory Visit (HOSPITAL_COMMUNITY): Payer: Self-pay | Admitting: Anesthesiology

## 2024-04-22 ENCOUNTER — Other Ambulatory Visit: Payer: Self-pay

## 2024-04-22 ENCOUNTER — Ambulatory Visit (HOSPITAL_COMMUNITY)
Admission: RE | Admit: 2024-04-22 | Discharge: 2024-04-22 | Disposition: A | Discharge status: Home / Self Care | Attending: Obstetrics and Gynecology | Admitting: Obstetrics and Gynecology

## 2024-04-22 ENCOUNTER — Ambulatory Visit (HOSPITAL_BASED_OUTPATIENT_CLINIC_OR_DEPARTMENT_OTHER): Payer: Self-pay | Admitting: Anesthesiology

## 2024-04-22 DIAGNOSIS — E119 Type 2 diabetes mellitus without complications: Secondary | ICD-10-CM | POA: Insufficient documentation

## 2024-04-22 DIAGNOSIS — E282 Polycystic ovarian syndrome: Secondary | ICD-10-CM | POA: Insufficient documentation

## 2024-04-22 DIAGNOSIS — D259 Leiomyoma of uterus, unspecified: Secondary | ICD-10-CM | POA: Insufficient documentation

## 2024-04-22 DIAGNOSIS — F418 Other specified anxiety disorders: Secondary | ICD-10-CM | POA: Diagnosis not present

## 2024-04-22 DIAGNOSIS — I10 Essential (primary) hypertension: Secondary | ICD-10-CM

## 2024-04-22 DIAGNOSIS — G4733 Obstructive sleep apnea (adult) (pediatric): Secondary | ICD-10-CM | POA: Insufficient documentation

## 2024-04-22 DIAGNOSIS — K219 Gastro-esophageal reflux disease without esophagitis: Secondary | ICD-10-CM | POA: Insufficient documentation

## 2024-04-22 DIAGNOSIS — N8003 Adenomyosis of the uterus: Secondary | ICD-10-CM | POA: Diagnosis not present

## 2024-04-22 DIAGNOSIS — N888 Other specified noninflammatory disorders of cervix uteri: Secondary | ICD-10-CM | POA: Diagnosis not present

## 2024-04-22 DIAGNOSIS — Z01818 Encounter for other preprocedural examination: Secondary | ICD-10-CM

## 2024-04-22 DIAGNOSIS — E039 Hypothyroidism, unspecified: Secondary | ICD-10-CM | POA: Diagnosis not present

## 2024-04-22 DIAGNOSIS — Z9889 Other specified postprocedural states: Secondary | ICD-10-CM

## 2024-04-22 HISTORY — PX: ROBOTIC ASSISTED TOTAL HYSTERECTOMY WITH BILATERAL SALPINGO OOPHERECTOMY: SHX6086

## 2024-04-22 HISTORY — DX: Personal history of gestational diabetes: Z86.32

## 2024-04-22 HISTORY — PX: CYSTOSCOPY: SHX5120

## 2024-04-22 HISTORY — DX: Type 2 diabetes mellitus without complications: E11.9

## 2024-04-22 HISTORY — DX: Presence of spectacles and contact lenses: Z97.3

## 2024-04-22 HISTORY — DX: Other specified diabetes mellitus with mild nonproliferative diabetic retinopathy without macular edema, unspecified eye: E13.3299

## 2024-04-22 HISTORY — DX: Unspecified osteoarthritis, unspecified site: M19.90

## 2024-04-22 HISTORY — DX: Iron deficiency anemia secondary to blood loss (chronic): D50.0

## 2024-04-22 HISTORY — DX: Urge incontinence: N39.41

## 2024-04-22 LAB — GLUCOSE, CAPILLARY
Glucose-Capillary: 120 mg/dL — ABNORMAL HIGH (ref 70–99)
Glucose-Capillary: 103 mg/dL — ABNORMAL HIGH (ref 70–99)
Glucose-Capillary: 172 mg/dL — ABNORMAL HIGH (ref 70–99)

## 2024-04-22 LAB — POCT PREGNANCY, URINE: Preg Test, Ur: NEGATIVE

## 2024-04-22 SURGERY — HYSTERECTOMY, TOTAL, ROBOT-ASSISTED, LAPAROSCOPIC, WITH BILATERAL SALPINGO-OOPHORECTOMY
Anesthesia: General | Site: Pelvis

## 2024-04-22 MED ORDER — FLUTICASONE PROPIONATE 50 MCG/ACT NA SUSP: 1.0000 | Freq: Every day | NASAL | Status: DC | PRN

## 2024-04-22 MED ORDER — METRONIDAZOLE 500 MG/100ML IV SOLN
INTRAVENOUS | Status: DC | PRN
  Administered 2024-04-22: 500 mg via INTRAVENOUS

## 2024-04-22 MED ORDER — DEXAMETHASONE SODIUM PHOSPHATE 10 MG/ML IJ SOLN
INTRAMUSCULAR | Status: DC | PRN
Start: 2024-04-22 — End: 2024-04-22
  Administered 2024-04-22: 10 mg via INTRAVENOUS

## 2024-04-22 MED ORDER — ONDANSETRON HCL 4 MG/2ML IJ SOLN
INTRAMUSCULAR | Status: AC
  Filled 2024-04-22: qty 2

## 2024-04-22 MED ORDER — DOCUSATE SODIUM 100 MG PO CAPS
100.0000 mg | ORAL_CAPSULE | Freq: Two times a day (BID) | ORAL | Status: DC
  Administered 2024-04-22: 100 mg via ORAL
  Filled 2024-04-22: qty 1

## 2024-04-22 MED ORDER — GABAPENTIN 300 MG PO CAPS
ORAL_CAPSULE | ORAL | Status: AC
  Filled 2024-04-22: qty 1

## 2024-04-22 MED ORDER — EPHEDRINE 5 MG/ML INJ
INTRAVENOUS | Status: AC
  Filled 2024-04-22: qty 5

## 2024-04-22 MED ORDER — AMLODIPINE BESYLATE 5 MG PO TABS: 5.0000 mg | ORAL_TABLET | Freq: Every day | ORAL | Status: DC

## 2024-04-22 MED ORDER — ROPIVACAINE HCL 5 MG/ML IJ SOLN
INTRAMUSCULAR | Status: AC
  Filled 2024-04-22: qty 30

## 2024-04-22 MED ORDER — ONDANSETRON HCL 4 MG PO TABS: 4.0000 mg | ORAL_TABLET | Freq: Four times a day (QID) | ORAL | Status: DC | PRN

## 2024-04-22 MED ORDER — ROCURONIUM BROMIDE 10 MG/ML (PF) SYRINGE
PREFILLED_SYRINGE | INTRAVENOUS | Status: AC
  Filled 2024-04-22: qty 10

## 2024-04-22 MED ORDER — CHLORHEXIDINE GLUCONATE 0.12 % MT SOLN
OROMUCOSAL | Status: AC
  Filled 2024-04-22: qty 15

## 2024-04-22 MED ORDER — GABAPENTIN 100 MG PO CAPS
100.0000 mg | ORAL_CAPSULE | Freq: Three times a day (TID) | ORAL | Status: DC
  Administered 2024-04-22 (×2): 100 mg via ORAL
  Filled 2024-04-22 (×2): qty 1

## 2024-04-22 MED ORDER — INSULIN ASPART 100 UNIT/ML IJ SOLN: 0.0000 [IU] | INTRAMUSCULAR | Status: DC | PRN

## 2024-04-22 MED ORDER — EPHEDRINE SULFATE-NACL 50-0.9 MG/10ML-% IV SOSY
PREFILLED_SYRINGE | INTRAVENOUS | Status: DC | PRN
Start: 2024-04-22 — End: 2024-04-22
  Administered 2024-04-22: 10 mg via INTRAVENOUS

## 2024-04-22 MED ORDER — LACTATED RINGERS IV SOLN: INTRAVENOUS | Status: DC

## 2024-04-22 MED ORDER — CHLORHEXIDINE GLUCONATE 0.12 % MT SOLN
15.0000 mL | Freq: Once | OROMUCOSAL | Status: AC
  Administered 2024-04-22: 15 mL via OROMUCOSAL

## 2024-04-22 MED ORDER — MENTHOL 3 MG MT LOZG: 1.0000 | LOZENGE | OROMUCOSAL | Status: DC | PRN

## 2024-04-22 MED ORDER — PROPOFOL 10 MG/ML IV BOLUS
INTRAVENOUS | Status: AC
  Filled 2024-04-22: qty 20

## 2024-04-22 MED ORDER — HYDROMORPHONE HCL 1 MG/ML IJ SOLN
INTRAMUSCULAR | Status: DC | PRN
  Administered 2024-04-22: .5 mg via INTRAVENOUS

## 2024-04-22 MED ORDER — SUGAMMADEX SODIUM 200 MG/2ML IV SOLN
INTRAVENOUS | Status: DC | PRN
Start: 2024-04-22 — End: 2024-04-22
  Administered 2024-04-22: 200 mg via INTRAVENOUS

## 2024-04-22 MED ORDER — LISINOPRIL 10 MG PO TABS
20.0000 mg | ORAL_TABLET | Freq: Every day | ORAL | Status: DC
  Administered 2024-04-22: 20 mg via ORAL
  Filled 2024-04-22: qty 2

## 2024-04-22 MED ORDER — PHENYLEPHRINE 80 MCG/ML (10ML) SYRINGE FOR IV PUSH (FOR BLOOD PRESSURE SUPPORT)
PREFILLED_SYRINGE | INTRAVENOUS | Status: AC
  Filled 2024-04-22: qty 10

## 2024-04-22 MED ORDER — FENTANYL CITRATE (PF) 100 MCG/2ML IJ SOLN
INTRAMUSCULAR | Status: DC | PRN
  Administered 2024-04-22 (×2): 50 ug via INTRAVENOUS

## 2024-04-22 MED ORDER — CEFAZOLIN SODIUM-DEXTROSE 2-4 GM/100ML-% IV SOLN
2.0000 g | INTRAVENOUS | Status: AC
  Administered 2024-04-22: 2 g via INTRAVENOUS

## 2024-04-22 MED ORDER — OXYCODONE HCL 5 MG PO TABS
5.0000 mg | ORAL_TABLET | ORAL | Status: DC | PRN
  Administered 2024-04-22: 5 mg via ORAL
  Filled 2024-04-22: qty 1

## 2024-04-22 MED ORDER — POVIDONE-IODINE 10 % EX SWAB: 2.0000 | Freq: Once | CUTANEOUS | Status: DC

## 2024-04-22 MED ORDER — FLUORESCEIN SODIUM 10 % IV SOLN
INTRAVENOUS | Status: AC
  Filled 2024-04-22: qty 5

## 2024-04-22 MED ORDER — HYDROMORPHONE HCL 1 MG/ML IJ SOLN: 0.2000 mg | INTRAMUSCULAR | Status: DC | PRN

## 2024-04-22 MED ORDER — ATROPINE SULFATE 0.4 MG/ML IV SOLN
INTRAVENOUS | Status: DC | PRN
Start: 2024-04-22 — End: 2024-04-22
  Administered 2024-04-22: .4 mg via INTRAVENOUS

## 2024-04-22 MED ORDER — IBUPROFEN 800 MG PO TABS
800.0000 mg | ORAL_TABLET | Freq: Three times a day (TID) | ORAL | Status: DC
  Administered 2024-04-22: 800 mg via ORAL
  Filled 2024-04-22: qty 1

## 2024-04-22 MED ORDER — CEFAZOLIN SODIUM-DEXTROSE 2-4 GM/100ML-% IV SOLN
INTRAVENOUS | Status: AC
  Filled 2024-04-22: qty 100

## 2024-04-22 MED ORDER — GABAPENTIN 300 MG PO CAPS
300.0000 mg | ORAL_CAPSULE | ORAL | Status: AC
  Administered 2024-04-22: 300 mg via ORAL

## 2024-04-22 MED ORDER — MIDAZOLAM HCL 2 MG/2ML IJ SOLN
INTRAMUSCULAR | Status: DC | PRN
Start: 2024-04-22 — End: 2024-04-22
  Administered 2024-04-22: 2 mg via INTRAVENOUS

## 2024-04-22 MED ORDER — ORAL CARE MOUTH RINSE: 15.0000 mL | Freq: Once | OROMUCOSAL | Status: AC

## 2024-04-22 MED ORDER — SODIUM CHLORIDE (PF) 0.9 % IJ SOLN
INTRAMUSCULAR | Status: AC
  Filled 2024-04-22: qty 30

## 2024-04-22 MED ORDER — ACETAMINOPHEN 500 MG PO TABS
1000.0000 mg | ORAL_TABLET | ORAL | Status: AC
  Administered 2024-04-22: 1000 mg via ORAL

## 2024-04-22 MED ORDER — PHENYLEPHRINE 80 MCG/ML (10ML) SYRINGE FOR IV PUSH (FOR BLOOD PRESSURE SUPPORT)
PREFILLED_SYRINGE | INTRAVENOUS | Status: AC
  Filled 2024-04-22: qty 20

## 2024-04-22 MED ORDER — SODIUM CHLORIDE 0.9 % IR SOLN
Status: DC | PRN
  Administered 2024-04-22: 1000 mL

## 2024-04-22 MED ORDER — PHENYLEPHRINE 80 MCG/ML (10ML) SYRINGE FOR IV PUSH (FOR BLOOD PRESSURE SUPPORT)
PREFILLED_SYRINGE | INTRAVENOUS | Status: DC | PRN
  Administered 2024-04-22: 80 ug via INTRAVENOUS

## 2024-04-22 MED ORDER — DIPHENHYDRAMINE HCL 50 MG/ML IJ SOLN
INTRAMUSCULAR | Status: DC | PRN
  Administered 2024-04-22: 25 mg via INTRAVENOUS

## 2024-04-22 MED ORDER — LIDOCAINE HCL (CARDIAC) PF 100 MG/5ML IV SOSY
PREFILLED_SYRINGE | INTRAVENOUS | Status: DC | PRN
Start: 2024-04-22 — End: 2024-04-22
  Administered 2024-04-22: 100 mg via INTRATRACHEAL

## 2024-04-22 MED ORDER — CELECOXIB 200 MG PO CAPS
ORAL_CAPSULE | ORAL | Status: AC
  Filled 2024-04-22: qty 2

## 2024-04-22 MED ORDER — HYDROMORPHONE HCL 1 MG/ML IJ SOLN: 0.2500 mg | INTRAMUSCULAR | Status: DC | PRN

## 2024-04-22 MED ORDER — FLUORESCEIN SODIUM 10 % IV SOLN
INTRAVENOUS | Status: DC | PRN
Start: 2024-04-22 — End: 2024-04-22
  Administered 2024-04-22: 1 mL via INTRAVENOUS

## 2024-04-22 MED ORDER — SOD CITRATE-CITRIC ACID 500-334 MG/5ML PO SOLN: 30.0000 mL | ORAL | Status: DC

## 2024-04-22 MED ORDER — LIDOCAINE 2% (20 MG/ML) 5 ML SYRINGE
INTRAMUSCULAR | Status: AC
  Filled 2024-04-22: qty 5

## 2024-04-22 MED ORDER — ROCURONIUM BROMIDE 10 MG/ML (PF) SYRINGE
PREFILLED_SYRINGE | INTRAVENOUS | Status: DC | PRN
  Administered 2024-04-22: 60 mg via INTRAVENOUS
  Administered 2024-04-22: 20 mg via INTRAVENOUS

## 2024-04-22 MED ORDER — SCOPOLAMINE 1 MG/3DAYS TD PT72
MEDICATED_PATCH | TRANSDERMAL | Status: AC
  Filled 2024-04-22: qty 1

## 2024-04-22 MED ORDER — PROPOFOL 10 MG/ML IV BOLUS
INTRAVENOUS | Status: DC | PRN
Start: 2024-04-22 — End: 2024-04-22
  Administered 2024-04-22: 150 mg via INTRAVENOUS

## 2024-04-22 MED ORDER — ACETAMINOPHEN 500 MG PO TABS
ORAL_TABLET | ORAL | Status: AC
  Filled 2024-04-22: qty 2

## 2024-04-22 MED ORDER — SODIUM CHLORIDE 0.9 % IV SOLN
INTRAVENOUS | Status: DC | PRN
  Administered 2024-04-22: 60 mL

## 2024-04-22 MED ORDER — FLUOXETINE HCL 20 MG PO CAPS: 40.0000 mg | ORAL_CAPSULE | Freq: Every day | ORAL | Status: DC

## 2024-04-22 MED ORDER — ONDANSETRON HCL 4 MG/2ML IJ SOLN: 4.0000 mg | Freq: Four times a day (QID) | INTRAMUSCULAR | Status: DC | PRN

## 2024-04-22 MED ORDER — MIDAZOLAM HCL 2 MG/2ML IJ SOLN
INTRAMUSCULAR | Status: AC
  Filled 2024-04-22: qty 2

## 2024-04-22 MED ORDER — OXYCODONE HCL 5 MG/5ML PO SOLN: 5.0000 mg | Freq: Once | ORAL | Status: DC | PRN

## 2024-04-22 MED ORDER — CELECOXIB 200 MG PO CAPS
400.0000 mg | ORAL_CAPSULE | ORAL | Status: AC
  Administered 2024-04-22: 400 mg via ORAL

## 2024-04-22 MED ORDER — LORATADINE 10 MG PO TABS
10.0000 mg | ORAL_TABLET | Freq: Every day | ORAL | Status: DC
  Administered 2024-04-22: 10 mg via ORAL
  Filled 2024-04-22: qty 1

## 2024-04-22 MED ORDER — METFORMIN HCL 500 MG PO TABS
500.0000 mg | ORAL_TABLET | Freq: Two times a day (BID) | ORAL | Status: DC
  Administered 2024-04-22: 500 mg via ORAL
  Filled 2024-04-22: qty 1

## 2024-04-22 MED ORDER — BUPIVACAINE HCL 0.25 % IJ SOLN
INTRAMUSCULAR | Status: DC | PRN
  Administered 2024-04-22: 20 mL

## 2024-04-22 MED ORDER — LEVOTHYROXINE SODIUM 25 MCG PO TABS: 200.0000 ug | ORAL_TABLET | Freq: Every day | ORAL | Status: DC

## 2024-04-22 MED ORDER — BUPIVACAINE HCL (PF) 0.25 % IJ SOLN
INTRAMUSCULAR | Status: AC
  Filled 2024-04-22: qty 30

## 2024-04-22 MED ORDER — AMISULPRIDE (ANTIEMETIC) 5 MG/2ML IV SOLN: 10.0000 mg | Freq: Once | INTRAVENOUS | Status: DC | PRN

## 2024-04-22 MED ORDER — 0.9 % SODIUM CHLORIDE (POUR BTL) OPTIME
TOPICAL | Status: DC | PRN
  Administered 2024-04-22: 1000 mL

## 2024-04-22 MED ORDER — SODIUM CHLORIDE (PF) 0.9 % IJ SOLN
INTRAMUSCULAR | Status: AC
  Filled 2024-04-22: qty 10

## 2024-04-22 MED ORDER — DEXAMETHASONE SODIUM PHOSPHATE 10 MG/ML IJ SOLN
INTRAMUSCULAR | Status: AC
  Filled 2024-04-22: qty 1

## 2024-04-22 MED ORDER — PANTOPRAZOLE SODIUM 40 MG PO TBEC: 40.0000 mg | DELAYED_RELEASE_TABLET | Freq: Every day | ORAL | Status: DC | PRN

## 2024-04-22 MED ORDER — OXYCODONE HCL 5 MG PO TABS: 5.0000 mg | ORAL_TABLET | Freq: Once | ORAL | Status: DC | PRN

## 2024-04-22 MED ORDER — ONDANSETRON HCL 4 MG/2ML IJ SOLN
INTRAMUSCULAR | Status: DC | PRN
  Administered 2024-04-22: 4 mg via INTRAVENOUS

## 2024-04-22 MED ORDER — FENTANYL CITRATE (PF) 100 MCG/2ML IJ SOLN
INTRAMUSCULAR | Status: AC
  Filled 2024-04-22: qty 2

## 2024-04-22 MED ORDER — SCOPOLAMINE 1 MG/3DAYS TD PT72
1.0000 | MEDICATED_PATCH | TRANSDERMAL | Status: DC
  Administered 2024-04-22: 1.5 mg via TRANSDERMAL

## 2024-04-22 MED ORDER — HYDROMORPHONE HCL 1 MG/ML IJ SOLN
INTRAMUSCULAR | Status: AC
  Filled 2024-04-22: qty 0.5

## 2024-04-22 MED ORDER — DIPHENHYDRAMINE HCL 50 MG/ML IJ SOLN
INTRAMUSCULAR | Status: AC
  Filled 2024-04-22: qty 1

## 2024-04-22 SURGICAL SUPPLY — 54 items
BARRIER ADHS 3X4 INTERCEED (GAUZE/BANDAGES/DRESSINGS) IMPLANT
COVER BACK TABLE 60X90IN (DRAPES) ×2 IMPLANT
COVER MAYO STAND STRL (DRAPES) ×2 IMPLANT
COVER TIP SHEARS 8 DVNC (MISCELLANEOUS) ×2 IMPLANT
DEFOGGER SCOPE WARM SEASHARP (MISCELLANEOUS) ×2 IMPLANT
DERMABOND ADVANCED .7 DNX12 (GAUZE/BANDAGES/DRESSINGS) ×2 IMPLANT
DERMABOND ADVANCED .7 DNX6 (GAUZE/BANDAGES/DRESSINGS) IMPLANT
DRAPE ARM DVNC X/XI (DISPOSABLE) ×8 IMPLANT
DRAPE COLUMN DVNC XI (DISPOSABLE) ×2 IMPLANT
DRAPE SURG IRRIG POUCH 19X23 (DRAPES) ×2 IMPLANT
DRAPE UTILITY XL STRL (DRAPES) ×2 IMPLANT
DRIVER NDL MEGA SUTCUT DVNCXI (INSTRUMENTS) ×2 IMPLANT
DRIVER NDLE MEGA SUTCUT DVNCXI (INSTRUMENTS) ×2 IMPLANT
DURAPREP 26ML APPLICATOR (WOUND CARE) ×2 IMPLANT
ELECTRODE REM PT RTRN 9FT ADLT (ELECTROSURGICAL) ×2 IMPLANT
FORCEPS BPLR FENES DVNC XI (FORCEP) ×2 IMPLANT
FORCEPS PROGRASP DVNC XI (FORCEP) IMPLANT
FORCEPS TENACULUM DVNC XI (FORCEP) IMPLANT
GAUZE 4X4 16PLY ~~LOC~~+RFID DBL (SPONGE) IMPLANT
GLOVE BIO SURGEON STRL SZ7 (GLOVE) ×6 IMPLANT
GLOVE BIOGEL PI IND STRL 7.0 (GLOVE) ×4 IMPLANT
GLOVE BIOGEL PI IND STRL 7.5 (GLOVE) IMPLANT
GLOVE BIOGEL PI MICRO STRL 7 (GLOVE) IMPLANT
GLOVE SURG SS PI 7.0 STRL IVOR (GLOVE) IMPLANT
GOWN STRL SURGICAL XL XLNG (GOWN DISPOSABLE) IMPLANT
HIBICLENS CHG 4% 4OZ BTL (MISCELLANEOUS) ×2 IMPLANT
IRRIGATION STRYKERFLOW (MISCELLANEOUS) ×2 IMPLANT
KIT PINK PAD W/HEAD ARM REST (MISCELLANEOUS) ×2 IMPLANT
LEGGING LITHOTOMY PAIR STRL (DRAPES) ×2 IMPLANT
MANIFOLD NEPTUNE II (INSTRUMENTS) IMPLANT
MANIPULATOR ADVINCU DEL 2.5 PL (MISCELLANEOUS) IMPLANT
MANIPULATOR ADVINCU DEL 3.0 PL (MISCELLANEOUS) IMPLANT
MANIPULATOR ADVINCU DEL 3.5 PL (MISCELLANEOUS) IMPLANT
MANIPULATOR ADVINCU DEL 4.0 PL (MISCELLANEOUS) IMPLANT
NDL INSUFFLATION 14GA 120MM (NEEDLE) ×2 IMPLANT
NEEDLE INSUFFLATION 14GA 120MM (NEEDLE) ×2 IMPLANT
OBTURATOR OPTICALSTD 8 DVNC (TROCAR) ×2 IMPLANT
PACK ROBOT WH (CUSTOM PROCEDURE TRAY) ×2 IMPLANT
PACK ROBOTIC GOWN (GOWN DISPOSABLE) ×2 IMPLANT
PAD OB MATERNITY 11 LF (PERSONAL CARE ITEMS) ×2 IMPLANT
POUCH LAPAROSCOPIC INSTRUMENT (MISCELLANEOUS) IMPLANT
SCISSORS MNPLR CVD DVNC XI (INSTRUMENTS) ×2 IMPLANT
SEAL UNIV 5-12 XI (MISCELLANEOUS) ×6 IMPLANT
SET IRRIG Y TYPE TUR BLADDER L (SET/KITS/TRAYS/PACK) ×2 IMPLANT
SET TRI-LUMEN FLTR TB AIRSEAL (TUBING) ×2 IMPLANT
SUT VIC AB 0 CT1 36 (SUTURE) ×2 IMPLANT
SUT VICRYL RAPIDE 3 0 (SUTURE) ×4 IMPLANT
SUT VLOC 180 0 9IN GS21 (SUTURE) ×2 IMPLANT
TOWEL GREEN STERILE (TOWEL DISPOSABLE) ×2 IMPLANT
TRAY FOLEY W/BAG SLVR 14FR (SET/KITS/TRAYS/PACK) ×2 IMPLANT
TROCAR PORT AIRSEAL 5X120 (TROCAR) ×2 IMPLANT
TUBING TUR DISP (UROLOGICAL SUPPLIES) ×2 IMPLANT
UNDERPAD 30X36 HEAVY ABSORB (UNDERPADS AND DIAPERS) ×2 IMPLANT
WATER STERILE IRR 1000ML POUR (IV SOLUTION) ×2 IMPLANT

## 2024-04-22 NOTE — Transfer of Care (Signed)
 Immediate Anesthesia Transfer of Care Note  Patient: [name]  Procedure(s) Performed: HYSTERECTOMY, TOTAL, ROBOT-ASSISTED, LAPAROSCOPIC, WITH BILATERAL SALPINGECTOMIES (Pelvis) CYSTOSCOPY (Bladder)  Patient Location: PACU  Anesthesia Type:General  Level of Consciousness: awake  Airway & Oxygen Therapy: Patient Spontanous Breathing and Patient connected to nasal cannula oxygen  Post-op Assessment: Report given to RN and Post -op Vital signs reviewed and stable  Post vital signs: Reviewed and stable  Last Vitals:  Vitals Value Taken Time  BP 120/73 04/22/24 15:15  Temp    Pulse 89 04/22/24 15:19  Resp 19 04/22/24 15:19  SpO2 99 % 04/22/24 15:19  Vitals shown include unfiled device data.  Last Pain:  Vitals:   04/22/24 0814  TempSrc: Oral  PainSc: 0-No pain      Patients Stated Pain Goal: 6 (04/22/24 9185)  Complications: No notable events documented.

## 2024-04-22 NOTE — Op Note (Signed)
 04/22/2024  3:07 PM  PATIENT:  [name]  48 y.o. female  PRE-OPERATIVE DIAGNOSIS:  uterine leiomyoma  POST-OPERATIVE DIAGNOSIS:  uterine leiomyoma  PROCEDURE:  Procedure(s): HYSTERECTOMY, TOTAL, ROBOT-ASSISTED, LAPAROSCOPIC, WITH BILATERAL SALPINGECTOMIES (N/A) CYSTOSCOPY (N/A)  SURGEON:  Surgeons and Role:    * Sarrah Browning, MD - Primary    * Claire Rubie LABOR, MD - Assisting  ANESTHESIA:   general  EBL:  <100 cc  BLOOD ADMINISTERED:none  DRAINS: Urinary Catheter (Foley)   LOCAL MEDICATIONS USED:  MARCAINE    and ropivicaine  SPECIMEN:  Source of Specimen:  uterus, cervix and bilateral tubes  DISPOSITION OF SPECIMEN:  PATHOLOGY  COUNTS:  YES  TOURNIQUET:  * No tourniquets in log *  DICTATION: .Note written in EPIC  PLAN OF CARE: Admit for overnight observation  PATIENT DISPOSITION:  PACU - hemodynamically stable.   Delay start of Pharmacological VTE agent (>24hrs) due to surgical blood loss or risk of bleeding: not applicable  Complications:  None.  Findings:  12 weeks size uterus.  Ovaries were normal.  The ureters were identified during multiple points of the case and were always out of the field of dissection.  On cystoscopy, the bladder was intact and bilateral spill was seen from each ureteral oriface.       Technique:   After adequate anesthesia was achieved the patient was positioned, prepped and draped in usual sterile fashion.  A speculum was placed in the vagina and the cervix dilated with pratt dilators.  The 3 cm Koh ring Advincula was assembled and placed in proper fashion.  The  Speculum was removed and the bladder catheterized with a foley.    Post abdominoplasty, the pt has no umbilicus.  Attention was turned to the abdomen where a 1 cm incision was made at [employer] point.  The veress needle was inserted without aspiration of bowel contents or blood.  The long 5 mm airseal trocar was placed with optiview and the other three trocar  sites were marked out, all approximately 10 cm from each other and below the airseal. An 8.5 mm trocar was introduced above the umbilicus and the other 8.5 mm trocars were placed on either side of the camera port.  All trocars were inserted under direct visualization of the camera.  The patient was placed in trendelenburg and then the Robot docked.  The fenestrated bipolar were placed on arm 1 and the Hot shears on arm 3 and introduced under direct visualization of the camera.   I then broke scrub and sat down at the console.  The above findings were noted and the ureters identified well out of the field of dissection.  The right fallopian tube was isolated and cauterized with the bipolar.  The Utero-ovarian ligament was then divided with the bipolar cautery and shears.  The posterior broad ligament was then divided with the hot shears until the uterosacral ligament.  The Broad and cardinal ligaments were then cauterized against the cervix to the level of the Koh ring, securing the uterine artery.  Each pedicle was then incised with the shears.  The anterior leaf was then incised at the reflection of the vessico-uterine junction and the lateral bladder retracted inferiorly after the round ligament had been divided with the bipolar forceps.  The left tube was cauterized with the bipolar and divided with the shears;  then the left utero-ovarian ligament divided with the bipolar forceps and the scissors.  The round ligament was divided as well and the posterior  leaf of the broad ligament then divided with the hot shears. The broad and cardinal ligaments were then cauterized on the left in the same way.   At the level of the internal os, the uterine arteries were bilaterally cauterized with the bipolar.  The ureters were identified well out of the field of dissection.     The bladder was then able to be retracted inferiorly and the vesico-uterine fascia was incised in the midline until the bladder was removed one  cm below the Koh ring.  The hot shears then circumferentially incised the vagina at the level of the reflection on the [facility] ring.  Once the uterus and cervix were amputated, cautery was used to insure hemostasis of the cuff.  Once hemostasis was achieved, the scissors were changed to the mega suture cut needle driver and the cuff was closed with a running stitches of 0-vicryl V loc.  Cautery was used to ensure hemostasis of the left pedicles very superficially. The ureters were peristalsing bilaterally well and very lateral to the areas of operation.     The Robot was then undocked and I scrubbed back in.  The needle was removed and Ropivicaine was introduced into the pelvis. The skin incisions were closed with subcuticular stitches of 3-0 vicryl Rapide and Dermabond.  All instruments were removed from the vagina and cystoscopy performed, revealing an intact bladder and vigourous spill of urine from each ureteral orifice.  The cystoscope was removed and the patient taken to the recovery room in stable condition.   Rhoderick Farrel A

## 2024-04-22 NOTE — Interval H&P Note (Signed)
 History and Physical Interval Note:  04/22/2024 1:01 PM  [name]  has presented today for surgery, with the diagnosis of uterine leiomyoma.  The various methods of treatment have been discussed with the patient and family. After consideration of risks, benefits and other options for treatment, the patient has consented to  Procedure(s): HYSTERECTOMY, TOTAL, ROBOT-ASSISTED, LAPAROSCOPIC, WITH BILATERAL SALPINGO-OOPHORECTOMY (N/A) CYSTOSCOPY (N/A) as a surgical intervention.  The patient's history has been reviewed, patient examined, no change in status, stable for surgery.  I have reviewed the patient's chart and labs.  Questions were answered to the patient's satisfaction.     [name]

## 2024-04-22 NOTE — Brief Op Note (Signed)
 04/22/2024  3:07 PM  PATIENT:  [name]  48 y.o. female  PRE-OPERATIVE DIAGNOSIS:  uterine leiomyoma  POST-OPERATIVE DIAGNOSIS:  uterine leiomyoma  PROCEDURE:  Procedure(s): HYSTERECTOMY, TOTAL, ROBOT-ASSISTED, LAPAROSCOPIC, WITH BILATERAL SALPINGECTOMIES (N/A) CYSTOSCOPY (N/A)  SURGEON:  Surgeons and Role:    * Sarrah Browning, MD - Primary    * Claire Rubie LABOR, MD - Assisting  ANESTHESIA:   general  EBL:  <100 cc  BLOOD ADMINISTERED:none  DRAINS: Urinary Catheter (Foley)   LOCAL MEDICATIONS USED:  MARCAINE    and ropivicaine  SPECIMEN:  Source of Specimen:  uterus, cervix and bilateral tubes  DISPOSITION OF SPECIMEN:  PATHOLOGY  COUNTS:  YES  TOURNIQUET:  * No tourniquets in log *  DICTATION: .Note written in EPIC  PLAN OF CARE: Admit for overnight observation  PATIENT DISPOSITION:  PACU - hemodynamically stable.   Delay start of Pharmacological VTE agent (>24hrs) due to surgical blood loss or risk of bleeding: not applicable

## 2024-04-22 NOTE — H&P (View-Only) (Signed)
 There has been no change in the patients history, status or exam since the history and physical.  Vitals:   04/16/24 1618 04/22/24 0814  BP:  (!) 145/80  Pulse:  68  Resp:  17  Temp:  97.9 F (36.6 C)  TempSrc:  Oral  SpO2:  96%  Weight: 94.3 kg 94.8 kg  Height: 5' 2 (1.575 m) 5' 2 (1.575 m)    Results for orders placed or performed during the hospital encounter of 04/22/24 (from the past 72 hours)  Pregnancy, urine POC     Status: None   Collection Time: 04/22/24  8:11 AM  Result Value Ref Range   Preg Test, Ur NEGATIVE NEGATIVE    Comment:        THE SENSITIVITY OF THIS METHODOLOGY IS >20 mIU/mL.   Glucose, capillary     Status: Abnormal   Collection Time: 04/22/24  8:58 AM  Result Value Ref Range   Glucose-Capillary 120 (H) 70 - 99 mg/dL    Comment: Glucose reference range applies only to samples taken after fasting for at least 8 hours.  Glucose, capillary     Status: Abnormal   Collection Time: 04/22/24 11:42 AM  Result Value Ref Range   Glucose-Capillary 103 (H) 70 - 99 mg/dL    Comment: Glucose reference range applies only to samples taken after fasting for at least 8 hours.    [name]

## 2024-04-22 NOTE — Anesthesia Postprocedure Evaluation (Signed)
 Anesthesia Post Note  Patient: [name]  Procedure(s) Performed: HYSTERECTOMY, TOTAL, ROBOT-ASSISTED, LAPAROSCOPIC, WITH BILATERAL SALPINGECTOMIES (Pelvis) CYSTOSCOPY (Bladder)     Patient location during evaluation: PACU Anesthesia Type: General Level of consciousness: awake Pain management: pain level controlled Vital Signs Assessment: post-procedure vital signs reviewed and stable Respiratory status: spontaneous breathing, nonlabored ventilation and respiratory function stable Cardiovascular status: blood pressure returned to baseline and stable Postop Assessment: no apparent nausea or vomiting Anesthetic complications: no   No notable events documented.  Last Vitals:  Vitals:   04/22/24 1700 04/22/24 1800  BP: (!) 141/64 (!) 127/58  Pulse: 70 80  Resp: 18 16  Temp: (!) 36.4 C   SpO2: 95% 96%    Last Pain:  Vitals:   04/22/24 1800  TempSrc:   PainSc: 5                  Delon Aisha Arch

## 2024-04-22 NOTE — Progress Notes (Signed)
 There has been no change in the patients history, status or exam since the history and physical.  Vitals:   04/16/24 1618 04/22/24 0814  BP:  (!) 145/80  Pulse:  68  Resp:  17  Temp:  97.9 F (36.6 C)  TempSrc:  Oral  SpO2:  96%  Weight: 94.3 kg 94.8 kg  Height: 5' 2 (1.575 m) 5' 2 (1.575 m)    Results for orders placed or performed during the hospital encounter of 04/22/24 (from the past 72 hours)  Pregnancy, urine POC     Status: None   Collection Time: 04/22/24  8:11 AM  Result Value Ref Range   Preg Test, Ur NEGATIVE NEGATIVE    Comment:        THE SENSITIVITY OF THIS METHODOLOGY IS >20 mIU/mL.   Glucose, capillary     Status: Abnormal   Collection Time: 04/22/24  8:58 AM  Result Value Ref Range   Glucose-Capillary 120 (H) 70 - 99 mg/dL    Comment: Glucose reference range applies only to samples taken after fasting for at least 8 hours.  Glucose, capillary     Status: Abnormal   Collection Time: 04/22/24 11:42 AM  Result Value Ref Range   Glucose-Capillary 103 (H) 70 - 99 mg/dL    Comment: Glucose reference range applies only to samples taken after fasting for at least 8 hours.    [name]

## 2024-04-22 NOTE — Anesthesia Procedure Notes (Signed)
 Procedure Name: Intubation Date/Time: 04/22/2024 1:20 PM  Performed by: Viviana Almarie DASEN, CRNAPre-anesthesia Checklist: Patient identified, Emergency Drugs available, Suction available and Patient being monitored Patient Re-evaluated:Patient Re-evaluated prior to induction Oxygen Delivery Method: Circle System Utilized Preoxygenation: Pre-oxygenation with 100% oxygen Induction Type: IV induction Ventilation: Mask ventilation without difficulty Laryngoscope Size: Mac and 3 Grade View: Grade I Tube type: Oral Tube size: 7.0 mm Number of attempts: 1 Airway Equipment and Method: Stylet and Oral airway Placement Confirmation: ETT inserted through vocal cords under direct vision, positive ETCO2 and breath sounds checked- equal and bilateral Secured at: 21 cm Tube secured with: Tape Dental Injury: Teeth and Oropharynx as per pre-operative assessment

## 2024-04-22 NOTE — Discharge Summary (Signed)
 Physician Discharge Summary  Patient ID: [name] MRN: [id number] DOB/AGE: 11/10/1975 48 y.o.  Admit date: 04/22/2024 Discharge date: 04/22/2024  Admission Diagnoses:symptomatic fibroid uterus  Discharge Diagnoses:  Active Problems:   Postoperative state   Discharged Condition: good  Hospital Course: Uncomplicated robotic TLH, salpingectomies    Consults: None  Significant Diagnostic Studies: none  Treatments: surgery: Uncomplicated robotic TLH, salpingectomies     Discharge Exam: Blood pressure 128/61, pulse 68, temperature 98 F (36.7 C), temperature source Oral, resp. rate 17, height 5' 2 (1.575 m), weight 94.8 kg, SpO2 94%.   Disposition: Discharge disposition: 01-Home or Self Care       Discharge Instructions     Call MD for:  persistant nausea and vomiting   Complete by: As directed    Call MD for:  severe uncontrolled pain   Complete by: As directed    Call MD for:  temperature >100.4   Complete by: As directed    Diet - low sodium heart healthy   Complete by: As directed    Discharge instructions   Complete by: As directed    No driving on narcotics, no sexual activity for 2 weeks.   Increase activity slowly   Complete by: As directed    May shower / Bathe   Complete by: As directed    Shower, no bath for 2 weeks.   Remove dressing in 24 hours   Complete by: As directed    Sexual Activity Restrictions   Complete by: As directed    No sexual activity for 2 weeks.      Allergies as of 04/22/2024       Reactions   Food Shortness Of Breath, Swelling, Other (See Comments)   RAW/FRESH FRUIT--PEACHES, PEARS, APPLES=THROAT CLOSES/SWELLING & COUGHING SHORTNESS OF BREATH   Latex Itching   Peach [prunus Persica] Anaphylaxis   Shrimp [shellfish Allergy] Anaphylaxis   Plasticized Base [plastibase] Itching   Silicone Rash   Tape Rash   adhesive        Medication List     TAKE these medications    acetaminophen  500 MG  tablet Commonly known as: TYLENOL  Take 1,000 mg by mouth every 6 (six) hours as needed.   amLODipine  10 MG tablet Commonly known as: NORVASC  Take 5 mg by mouth daily.   diclofenac  Sodium 1 % Gel Commonly known as: VOLTAREN  Apply 4 g topically 4 (four) times daily as needed.   FLUoxetine  40 MG capsule Commonly known as: PROZAC  Take 40 mg by mouth daily.   fluticasone  50 MCG/ACT nasal spray Commonly known as: FLONASE  Place 1-2 sprays into both nostrils daily as needed for allergies or rhinitis.   gabapentin  100 MG capsule Commonly known as: NEURONTIN  Take 100 mg by mouth 3 (three) times daily.   ibuprofen  800 MG tablet Commonly known as: ADVIL  Take 800 mg by mouth every 8 (eight) hours as needed for moderate pain.   levothyroxine  200 MCG tablet Commonly known as: SYNTHROID  Take 1 tablet (200 mcg total) by mouth daily before breakfast.   lisinopril  20 MG tablet Commonly known as: ZESTRIL  Take 20 mg by mouth daily.   loratadine  10 MG tablet Commonly known as: CLARITIN  Take 10 mg by mouth daily.   metFORMIN  500 MG tablet Commonly known as: GLUCOPHAGE  Take 500 mg by mouth 2 (two) times daily with a meal. To start after surgery   Multi-Vitamin tablet Take 1 tablet by mouth daily.   pantoprazole  40 MG tablet Commonly known as: PROTONIX  Take  40 mg by mouth daily as needed.   triamcinolone  cream 0.1 % Commonly known as: KENALOG Apply 1 Application topically at bedtime as needed.      percocet  Follow-up Information     Sarrah Browning, MD Follow up in 2 week(s).   Specialty: Obstetrics and Gynecology Contact information: [address] [place] [address] [place] [address] [phone]                 Signed: Browning DELENA Sarrah 04/22/2024, 8:58 PM

## 2024-04-23 ENCOUNTER — Encounter (HOSPITAL_COMMUNITY): Payer: Self-pay | Admitting: Obstetrics and Gynecology

## 2024-04-24 LAB — SURGICAL PATHOLOGY

## 2024-06-26 ENCOUNTER — Ambulatory Visit: Admitting: Orthopaedic Surgery

## 2024-06-26 ENCOUNTER — Other Ambulatory Visit (INDEPENDENT_AMBULATORY_CARE_PROVIDER_SITE_OTHER): Payer: Self-pay

## 2024-06-26 DIAGNOSIS — M545 Low back pain, unspecified: Secondary | ICD-10-CM

## 2024-06-26 MED ORDER — NAPROXEN 500 MG PO TABS: 500.0000 mg | ORAL_TABLET | Freq: Two times a day (BID) | ORAL | 3 refills | Status: AC

## 2024-06-26 MED ORDER — METHYLPREDNISOLONE 4 MG PO TBPK: ORAL_TABLET | ORAL | 0 refills | Status: AC
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
## 2024-07-11 ENCOUNTER — Ambulatory Visit: Attending: Family Medicine

## 2024-07-11 ENCOUNTER — Other Ambulatory Visit: Payer: Self-pay

## 2024-07-11 DIAGNOSIS — M545 Low back pain, unspecified: Secondary | ICD-10-CM | POA: Insufficient documentation

## 2024-07-11 DIAGNOSIS — M5459 Other low back pain: Secondary | ICD-10-CM | POA: Insufficient documentation
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
## 2024-07-17 ENCOUNTER — Ambulatory Visit

## 2024-07-22 ENCOUNTER — Ambulatory Visit

## 2024-07-27 ENCOUNTER — Ambulatory Visit
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
## 2024-07-29 ENCOUNTER — Ambulatory Visit: Attending: Family Medicine

## 2024-07-29 DIAGNOSIS — M25551 Pain in right hip: Secondary | ICD-10-CM | POA: Insufficient documentation

## 2024-07-29 DIAGNOSIS — M5459 Other low back pain: Secondary | ICD-10-CM | POA: Insufficient documentation

## 2024-08-03 ENCOUNTER — Ambulatory Visit

## 2024-08-03 DIAGNOSIS — M5459 Other low back pain: Secondary | ICD-10-CM | POA: Diagnosis present

## 2024-08-03 DIAGNOSIS — M25551 Pain in right hip: Secondary | ICD-10-CM
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
## 2024-08-05 ENCOUNTER — Ambulatory Visit

## 2024-08-31 ENCOUNTER — Encounter: Payer: Self-pay | Admitting: Radiology

## 2024-09-30 ENCOUNTER — Other Ambulatory Visit: Payer: Self-pay | Admitting: Family Medicine

## 2024-09-30 DIAGNOSIS — R1032 Left lower quadrant pain: Secondary | ICD-10-CM

## 2024-10-14 ENCOUNTER — Inpatient Hospital Stay: Admission: RE | Admit: 2024-10-14 | Discharge: 2024-10-14 | Attending: Family Medicine | Admitting: Family Medicine

## 2024-10-14 DIAGNOSIS — R1032 Left lower quadrant pain: Secondary | ICD-10-CM

## 2024-10-14 MED ORDER — IOPAMIDOL (ISOVUE-370) INJECTION 76%
80.0000 mL | Freq: Once | INTRAVENOUS | Status: AC | PRN
  Administered 2024-10-14: 09:00:00 80 mL via INTRAVENOUS
